# Patient Record
Sex: Male | Born: 1945 | Race: Black or African American | Hispanic: No | Marital: Married | Smoking: Former smoker
Health system: Southern US, Community
[De-identification: ages and names within clinical notes are randomized; demographics above are authoritative.]

## PROBLEM LIST (undated history)

## (undated) DIAGNOSIS — I5032 Chronic diastolic (congestive) heart failure: Secondary | ICD-10-CM

## (undated) DIAGNOSIS — N433 Hydrocele, unspecified: Secondary | ICD-10-CM

## (undated) DIAGNOSIS — I2510 Atherosclerotic heart disease of native coronary artery without angina pectoris: Secondary | ICD-10-CM

## (undated) DIAGNOSIS — G4733 Obstructive sleep apnea (adult) (pediatric): Secondary | ICD-10-CM

## (undated) DIAGNOSIS — E119 Type 2 diabetes mellitus without complications: Secondary | ICD-10-CM

## (undated) DIAGNOSIS — I441 Atrioventricular block, second degree: Secondary | ICD-10-CM

## (undated) DIAGNOSIS — I4510 Unspecified right bundle-branch block: Secondary | ICD-10-CM

## (undated) DIAGNOSIS — N182 Chronic kidney disease, stage 2 (mild): Secondary | ICD-10-CM

## (undated) DIAGNOSIS — I272 Pulmonary hypertension, unspecified: Secondary | ICD-10-CM

## (undated) DIAGNOSIS — I10 Essential (primary) hypertension: Secondary | ICD-10-CM

## (undated) DIAGNOSIS — Z8719 Personal history of other diseases of the digestive system: Secondary | ICD-10-CM

## (undated) DIAGNOSIS — E785 Hyperlipidemia, unspecified: Secondary | ICD-10-CM

## (undated) DIAGNOSIS — K219 Gastro-esophageal reflux disease without esophagitis: Secondary | ICD-10-CM

## (undated) HISTORY — DX: Atrioventricular block, second degree: I44.1

## (undated) HISTORY — DX: Hydrocele, unspecified: N43.3

## (undated) HISTORY — PX: HEMORRHOID SURGERY: SHX153

## (undated) HISTORY — DX: Pulmonary hypertension, unspecified: I27.20

## (undated) HISTORY — PX: CARPAL TUNNEL RELEASE: SHX101

## (undated) HISTORY — DX: Atherosclerotic heart disease of native coronary artery without angina pectoris: I25.10

## (undated) HISTORY — DX: Gastro-esophageal reflux disease without esophagitis: K21.9

## (undated) HISTORY — DX: Personal history of other diseases of the digestive system: Z87.19

---
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
## 1972-02-09 HISTORY — PX: APPENDECTOMY: SHX54

## 2004-02-09 HISTORY — PX: SHOULDER ARTHROSCOPY: SHX128

## 2008-03-01 DIAGNOSIS — N529 Male erectile dysfunction, unspecified: Secondary | ICD-10-CM | POA: Insufficient documentation

## 2008-06-13 ENCOUNTER — Encounter: Admission: RE | Admit: 2008-06-13 | Discharge: 2008-06-13 | Payer: Self-pay | Admitting: Internal Medicine

## 2008-06-24 ENCOUNTER — Encounter: Admission: RE | Admit: 2008-06-24 | Discharge: 2008-06-24 | Payer: Self-pay | Admitting: Internal Medicine

## 2009-03-06 ENCOUNTER — Encounter: Admission: RE | Admit: 2009-03-06 | Discharge: 2009-03-06 | Payer: Self-pay | Admitting: Internal Medicine

## 2009-09-29 ENCOUNTER — Other Ambulatory Visit: Payer: Self-pay | Admitting: Sports Medicine

## 2010-03-19 DIAGNOSIS — I350 Nonrheumatic aortic (valve) stenosis: Secondary | ICD-10-CM | POA: Insufficient documentation

## 2010-03-27 HISTORY — PX: LEFT HEART CATH AND CORONARY ANGIOGRAPHY: CATH118249

## 2010-07-23 DIAGNOSIS — H902 Conductive hearing loss, unspecified: Secondary | ICD-10-CM | POA: Insufficient documentation

## 2010-12-10 DIAGNOSIS — I272 Pulmonary hypertension, unspecified: Secondary | ICD-10-CM

## 2010-12-10 HISTORY — DX: Pulmonary hypertension, unspecified: I27.20

## 2010-12-10 HISTORY — PX: TRANSTHORACIC ECHOCARDIOGRAM: SHX275

## 2010-12-17 DIAGNOSIS — N400 Enlarged prostate without lower urinary tract symptoms: Secondary | ICD-10-CM | POA: Insufficient documentation

## 2011-02-19 DIAGNOSIS — E291 Testicular hypofunction: Secondary | ICD-10-CM | POA: Diagnosis not present

## 2011-03-19 DIAGNOSIS — M109 Gout, unspecified: Secondary | ICD-10-CM | POA: Diagnosis not present

## 2011-03-19 DIAGNOSIS — E119 Type 2 diabetes mellitus without complications: Secondary | ICD-10-CM | POA: Diagnosis not present

## 2011-03-19 DIAGNOSIS — I10 Essential (primary) hypertension: Secondary | ICD-10-CM | POA: Diagnosis not present

## 2011-03-19 DIAGNOSIS — E109 Type 1 diabetes mellitus without complications: Secondary | ICD-10-CM | POA: Diagnosis not present

## 2011-03-19 DIAGNOSIS — Z79899 Other long term (current) drug therapy: Secondary | ICD-10-CM | POA: Diagnosis not present

## 2011-03-19 DIAGNOSIS — E785 Hyperlipidemia, unspecified: Secondary | ICD-10-CM | POA: Diagnosis not present

## 2011-03-22 ENCOUNTER — Other Ambulatory Visit: Payer: Self-pay | Admitting: Nephrology

## 2011-03-22 DIAGNOSIS — N182 Chronic kidney disease, stage 2 (mild): Secondary | ICD-10-CM

## 2011-03-22 DIAGNOSIS — E291 Testicular hypofunction: Secondary | ICD-10-CM | POA: Diagnosis not present

## 2011-03-26 ENCOUNTER — Other Ambulatory Visit: Payer: Self-pay

## 2011-03-30 ENCOUNTER — Ambulatory Visit
Admission: RE | Admit: 2011-03-30 | Discharge: 2011-03-30 | Disposition: A | Discharge status: Home / Self Care | Payer: Medicare Other | Source: Ambulatory Visit | Attending: Nephrology | Admitting: Nephrology

## 2011-03-30 DIAGNOSIS — N281 Cyst of kidney, acquired: Secondary | ICD-10-CM | POA: Diagnosis not present

## 2011-03-30 DIAGNOSIS — N189 Chronic kidney disease, unspecified: Secondary | ICD-10-CM | POA: Diagnosis not present

## 2011-03-30 DIAGNOSIS — N182 Chronic kidney disease, stage 2 (mild): Secondary | ICD-10-CM

## 2011-03-30 MED ORDER — GADOBENATE DIMEGLUMINE 529 MG/ML IV SOLN: 19.0000 mL | Freq: Once | INTRAVENOUS | Status: AC | PRN

## 2011-04-08 DIAGNOSIS — E291 Testicular hypofunction: Secondary | ICD-10-CM | POA: Diagnosis not present

## 2011-04-29 DIAGNOSIS — E291 Testicular hypofunction: Secondary | ICD-10-CM | POA: Diagnosis not present

## 2011-05-13 DIAGNOSIS — K2100 Gastro-esophageal reflux disease with esophagitis, without bleeding: Secondary | ICD-10-CM | POA: Insufficient documentation

## 2011-05-13 DIAGNOSIS — N281 Cyst of kidney, acquired: Secondary | ICD-10-CM | POA: Diagnosis not present

## 2011-05-13 DIAGNOSIS — R634 Abnormal weight loss: Secondary | ICD-10-CM | POA: Diagnosis not present

## 2011-05-13 DIAGNOSIS — R109 Unspecified abdominal pain: Secondary | ICD-10-CM | POA: Diagnosis not present

## 2011-05-19 ENCOUNTER — Other Ambulatory Visit: Payer: Self-pay | Admitting: Gastroenterology

## 2011-05-19 DIAGNOSIS — R109 Unspecified abdominal pain: Secondary | ICD-10-CM

## 2011-05-20 DIAGNOSIS — E291 Testicular hypofunction: Secondary | ICD-10-CM | POA: Diagnosis not present

## 2011-05-24 ENCOUNTER — Ambulatory Visit
Admission: RE | Admit: 2011-05-24 | Discharge: 2011-05-24 | Disposition: A | Discharge status: Home / Self Care | Payer: Medicare Other | Source: Ambulatory Visit | Attending: Gastroenterology | Admitting: Gastroenterology

## 2011-05-24 DIAGNOSIS — R109 Unspecified abdominal pain: Secondary | ICD-10-CM

## 2011-05-24 DIAGNOSIS — N281 Cyst of kidney, acquired: Secondary | ICD-10-CM | POA: Diagnosis not present

## 2011-05-24 DIAGNOSIS — N433 Hydrocele, unspecified: Secondary | ICD-10-CM | POA: Diagnosis not present

## 2011-05-24 MED ORDER — IOHEXOL 300 MG/ML  SOLN
75.0000 mL | Freq: Once | INTRAMUSCULAR | Status: AC | PRN
  Administered 2011-05-24: 75 mL via INTRAVENOUS

## 2011-06-09 DIAGNOSIS — Z1211 Encounter for screening for malignant neoplasm of colon: Secondary | ICD-10-CM | POA: Diagnosis not present

## 2011-06-09 DIAGNOSIS — R109 Unspecified abdominal pain: Secondary | ICD-10-CM | POA: Diagnosis not present

## 2011-06-10 DIAGNOSIS — E291 Testicular hypofunction: Secondary | ICD-10-CM | POA: Diagnosis not present

## 2011-06-16 DIAGNOSIS — K319 Disease of stomach and duodenum, unspecified: Secondary | ICD-10-CM | POA: Diagnosis not present

## 2011-06-16 DIAGNOSIS — R1013 Epigastric pain: Secondary | ICD-10-CM | POA: Diagnosis not present

## 2011-06-16 DIAGNOSIS — K2970 Gastritis, unspecified, without bleeding: Secondary | ICD-10-CM | POA: Diagnosis not present

## 2011-06-24 DIAGNOSIS — E291 Testicular hypofunction: Secondary | ICD-10-CM | POA: Diagnosis not present

## 2011-06-24 DIAGNOSIS — E119 Type 2 diabetes mellitus without complications: Secondary | ICD-10-CM | POA: Diagnosis not present

## 2011-06-24 DIAGNOSIS — E785 Hyperlipidemia, unspecified: Secondary | ICD-10-CM | POA: Diagnosis not present

## 2011-06-24 DIAGNOSIS — Z125 Encounter for screening for malignant neoplasm of prostate: Secondary | ICD-10-CM | POA: Diagnosis not present

## 2011-06-24 DIAGNOSIS — I10 Essential (primary) hypertension: Secondary | ICD-10-CM | POA: Diagnosis not present

## 2011-07-01 DIAGNOSIS — IMO0002 Reserved for concepts with insufficient information to code with codable children: Secondary | ICD-10-CM | POA: Diagnosis not present

## 2011-07-01 DIAGNOSIS — M549 Dorsalgia, unspecified: Secondary | ICD-10-CM | POA: Diagnosis not present

## 2011-07-02 DIAGNOSIS — R188 Other ascites: Secondary | ICD-10-CM | POA: Diagnosis not present

## 2011-07-02 DIAGNOSIS — M5126 Other intervertebral disc displacement, lumbar region: Secondary | ICD-10-CM | POA: Diagnosis not present

## 2011-07-02 DIAGNOSIS — R109 Unspecified abdominal pain: Secondary | ICD-10-CM | POA: Diagnosis not present

## 2011-07-07 DIAGNOSIS — E291 Testicular hypofunction: Secondary | ICD-10-CM | POA: Diagnosis not present

## 2011-07-12 DIAGNOSIS — E119 Type 2 diabetes mellitus without complications: Secondary | ICD-10-CM | POA: Diagnosis not present

## 2011-07-12 DIAGNOSIS — M47817 Spondylosis without myelopathy or radiculopathy, lumbosacral region: Secondary | ICD-10-CM | POA: Diagnosis not present

## 2011-07-12 DIAGNOSIS — M5137 Other intervertebral disc degeneration, lumbosacral region: Secondary | ICD-10-CM | POA: Diagnosis not present

## 2011-07-12 DIAGNOSIS — H25019 Cortical age-related cataract, unspecified eye: Secondary | ICD-10-CM | POA: Diagnosis not present

## 2011-07-12 DIAGNOSIS — H2510 Age-related nuclear cataract, unspecified eye: Secondary | ICD-10-CM | POA: Diagnosis not present

## 2011-07-12 DIAGNOSIS — H52209 Unspecified astigmatism, unspecified eye: Secondary | ICD-10-CM | POA: Diagnosis not present

## 2011-07-22 DIAGNOSIS — E291 Testicular hypofunction: Secondary | ICD-10-CM | POA: Diagnosis not present

## 2011-08-05 DIAGNOSIS — E291 Testicular hypofunction: Secondary | ICD-10-CM | POA: Diagnosis not present

## 2011-08-19 DIAGNOSIS — E291 Testicular hypofunction: Secondary | ICD-10-CM | POA: Diagnosis not present

## 2011-09-09 DIAGNOSIS — E291 Testicular hypofunction: Secondary | ICD-10-CM | POA: Diagnosis not present

## 2011-09-21 DIAGNOSIS — L049 Acute lymphadenitis, unspecified: Secondary | ICD-10-CM | POA: Diagnosis not present

## 2011-09-21 DIAGNOSIS — E291 Testicular hypofunction: Secondary | ICD-10-CM | POA: Diagnosis not present

## 2011-09-21 DIAGNOSIS — E119 Type 2 diabetes mellitus without complications: Secondary | ICD-10-CM | POA: Diagnosis not present

## 2011-09-23 DIAGNOSIS — H7090 Unspecified mastoiditis, unspecified ear: Secondary | ICD-10-CM | POA: Diagnosis not present

## 2011-10-06 DIAGNOSIS — E291 Testicular hypofunction: Secondary | ICD-10-CM | POA: Diagnosis not present

## 2011-10-20 DIAGNOSIS — N400 Enlarged prostate without lower urinary tract symptoms: Secondary | ICD-10-CM | POA: Diagnosis not present

## 2011-10-20 DIAGNOSIS — E291 Testicular hypofunction: Secondary | ICD-10-CM | POA: Diagnosis not present

## 2011-10-20 DIAGNOSIS — Z23 Encounter for immunization: Secondary | ICD-10-CM | POA: Diagnosis not present

## 2011-10-20 DIAGNOSIS — E785 Hyperlipidemia, unspecified: Secondary | ICD-10-CM | POA: Diagnosis not present

## 2011-10-20 DIAGNOSIS — R109 Unspecified abdominal pain: Secondary | ICD-10-CM | POA: Diagnosis not present

## 2011-11-04 DIAGNOSIS — E291 Testicular hypofunction: Secondary | ICD-10-CM | POA: Diagnosis not present

## 2011-11-19 DIAGNOSIS — E291 Testicular hypofunction: Secondary | ICD-10-CM | POA: Diagnosis not present

## 2011-11-29 DIAGNOSIS — L290 Pruritus ani: Secondary | ICD-10-CM | POA: Diagnosis not present

## 2011-12-03 DIAGNOSIS — E291 Testicular hypofunction: Secondary | ICD-10-CM | POA: Diagnosis not present

## 2011-12-17 DIAGNOSIS — E291 Testicular hypofunction: Secondary | ICD-10-CM | POA: Diagnosis not present

## 2011-12-21 DIAGNOSIS — E785 Hyperlipidemia, unspecified: Secondary | ICD-10-CM | POA: Diagnosis not present

## 2011-12-21 DIAGNOSIS — Z79899 Other long term (current) drug therapy: Secondary | ICD-10-CM | POA: Diagnosis not present

## 2011-12-21 DIAGNOSIS — E782 Mixed hyperlipidemia: Secondary | ICD-10-CM | POA: Diagnosis not present

## 2011-12-21 DIAGNOSIS — E119 Type 2 diabetes mellitus without complications: Secondary | ICD-10-CM | POA: Diagnosis not present

## 2011-12-21 DIAGNOSIS — E291 Testicular hypofunction: Secondary | ICD-10-CM | POA: Diagnosis not present

## 2012-01-03 DIAGNOSIS — E291 Testicular hypofunction: Secondary | ICD-10-CM | POA: Diagnosis not present

## 2012-01-17 DIAGNOSIS — E291 Testicular hypofunction: Secondary | ICD-10-CM | POA: Diagnosis not present

## 2012-02-11 DIAGNOSIS — E291 Testicular hypofunction: Secondary | ICD-10-CM | POA: Diagnosis not present

## 2012-02-17 DIAGNOSIS — E782 Mixed hyperlipidemia: Secondary | ICD-10-CM | POA: Diagnosis not present

## 2012-02-17 DIAGNOSIS — R42 Dizziness and giddiness: Secondary | ICD-10-CM | POA: Diagnosis not present

## 2012-02-17 DIAGNOSIS — F17200 Nicotine dependence, unspecified, uncomplicated: Secondary | ICD-10-CM | POA: Diagnosis not present

## 2012-02-17 DIAGNOSIS — I10 Essential (primary) hypertension: Secondary | ICD-10-CM | POA: Diagnosis not present

## 2012-03-01 DIAGNOSIS — E291 Testicular hypofunction: Secondary | ICD-10-CM | POA: Diagnosis not present

## 2012-03-17 ENCOUNTER — Encounter (HOSPITAL_COMMUNITY)
Admission: EM | Disposition: A | Discharge status: Home / Self Care | Payer: Self-pay | Source: Home / Self Care | Attending: Cardiology

## 2012-03-17 ENCOUNTER — Other Ambulatory Visit: Payer: Self-pay

## 2012-03-17 ENCOUNTER — Observation Stay (HOSPITAL_COMMUNITY)
Admission: EM | Admit: 2012-03-17 | Discharge: 2012-03-18 | Disposition: A | Discharge status: Home / Self Care | Payer: Medicare Other | Attending: Cardiology | Admitting: Cardiology

## 2012-03-17 ENCOUNTER — Encounter (HOSPITAL_COMMUNITY): Payer: Self-pay

## 2012-03-17 ENCOUNTER — Emergency Department (HOSPITAL_COMMUNITY): Payer: Medicare Other

## 2012-03-17 DIAGNOSIS — E782 Mixed hyperlipidemia: Secondary | ICD-10-CM | POA: Diagnosis not present

## 2012-03-17 DIAGNOSIS — I4510 Unspecified right bundle-branch block: Secondary | ICD-10-CM | POA: Diagnosis present

## 2012-03-17 DIAGNOSIS — I2510 Atherosclerotic heart disease of native coronary artery without angina pectoris: Secondary | ICD-10-CM | POA: Diagnosis not present

## 2012-03-17 DIAGNOSIS — R079 Chest pain, unspecified: Principal | ICD-10-CM | POA: Diagnosis present

## 2012-03-17 DIAGNOSIS — R9431 Abnormal electrocardiogram [ECG] [EKG]: Secondary | ICD-10-CM | POA: Diagnosis not present

## 2012-03-17 DIAGNOSIS — I129 Hypertensive chronic kidney disease with stage 1 through stage 4 chronic kidney disease, or unspecified chronic kidney disease: Secondary | ICD-10-CM | POA: Insufficient documentation

## 2012-03-17 DIAGNOSIS — G4733 Obstructive sleep apnea (adult) (pediatric): Secondary | ICD-10-CM | POA: Diagnosis present

## 2012-03-17 DIAGNOSIS — E119 Type 2 diabetes mellitus without complications: Secondary | ICD-10-CM | POA: Insufficient documentation

## 2012-03-17 DIAGNOSIS — N182 Chronic kidney disease, stage 2 (mild): Secondary | ICD-10-CM | POA: Diagnosis not present

## 2012-03-17 DIAGNOSIS — M25519 Pain in unspecified shoulder: Secondary | ICD-10-CM | POA: Diagnosis present

## 2012-03-17 DIAGNOSIS — I200 Unstable angina: Secondary | ICD-10-CM | POA: Diagnosis not present

## 2012-03-17 DIAGNOSIS — I441 Atrioventricular block, second degree: Secondary | ICD-10-CM | POA: Clinically undetermined

## 2012-03-17 DIAGNOSIS — I10 Essential (primary) hypertension: Secondary | ICD-10-CM | POA: Diagnosis not present

## 2012-03-17 DIAGNOSIS — F17200 Nicotine dependence, unspecified, uncomplicated: Secondary | ICD-10-CM | POA: Diagnosis not present

## 2012-03-17 HISTORY — DX: Chronic kidney disease, stage 2 (mild): N18.2

## 2012-03-17 HISTORY — DX: Obstructive sleep apnea (adult) (pediatric): G47.33

## 2012-03-17 HISTORY — DX: Essential (primary) hypertension: I10

## 2012-03-17 HISTORY — PX: LEFT HEART CATHETERIZATION WITH CORONARY ANGIOGRAM: SHX5451

## 2012-03-17 HISTORY — DX: Hyperlipidemia, unspecified: E78.5

## 2012-03-17 HISTORY — DX: Unspecified right bundle-branch block: I45.10

## 2012-03-17 HISTORY — DX: Type 2 diabetes mellitus without complications: E11.9

## 2012-03-17 LAB — GLUCOSE, CAPILLARY
Glucose-Capillary: 102 mg/dL — ABNORMAL HIGH (ref 70–99)
Glucose-Capillary: 128 mg/dL — ABNORMAL HIGH (ref 70–99)

## 2012-03-17 LAB — POCT I-STAT TROPONIN I: Troponin i, poc: 0.01 ng/mL (ref 0.00–0.08)

## 2012-03-17 LAB — CBC WITH DIFFERENTIAL/PLATELET
Basophils Absolute: 0 10*3/uL (ref 0.0–0.1)
Basophils Relative: 0 % (ref 0–1)
Eosinophils Absolute: 0.1 10*3/uL (ref 0.0–0.7)
Eosinophils Relative: 1 % (ref 0–5)
Hemoglobin: 16.9 g/dL (ref 13.0–17.0)
Lymphocytes Relative: 22 % (ref 12–46)
Lymphs Abs: 2 10*3/uL (ref 0.7–4.0)
MCHC: 35.5 g/dL (ref 30.0–36.0)
Monocytes Absolute: 0.4 10*3/uL (ref 0.1–1.0)
Neutro Abs: 6.3 10*3/uL (ref 1.7–7.7)
RBC: 5.75 MIL/uL (ref 4.22–5.81)

## 2012-03-17 LAB — TROPONIN I
Troponin I: <0.3 ng/mL (ref <0.30)
Troponin I: <0.3 ng/mL (ref <0.30)

## 2012-03-17 LAB — BASIC METABOLIC PANEL
CO2: 29 mEq/L (ref 19–32)
Calcium: 10.1 mg/dL (ref 8.4–10.5)
Chloride: 103 mEq/L (ref 96–112)
Creatinine, Ser: 1.3 mg/dL (ref 0.50–1.35)
GFR calc Af Amer: 64 mL/min — ABNORMAL LOW (ref >90)
GFR calc non Af Amer: 55 mL/min — ABNORMAL LOW (ref >90)

## 2012-03-17 LAB — APTT: aPTT: 33 seconds (ref 24–37)

## 2012-03-17 LAB — PROTIME-INR
INR: 0.91 (ref 0.00–1.49)
Prothrombin Time: 12.2 seconds (ref 11.6–15.2)

## 2012-03-17 SURGERY — LEFT HEART CATHETERIZATION WITH CORONARY ANGIOGRAM
Anesthesia: LOCAL

## 2012-03-17 MED ORDER — FENTANYL CITRATE 0.05 MG/ML IJ SOLN
INTRAMUSCULAR | Status: AC
  Filled 2012-03-17: qty 2

## 2012-03-17 MED ORDER — ONDANSETRON HCL 4 MG/2ML IJ SOLN: 4.0000 mg | Freq: Four times a day (QID) | INTRAMUSCULAR | Status: DC | PRN

## 2012-03-17 MED ORDER — SODIUM CHLORIDE 0.9 % IV SOLN
1.0000 mL/kg/h | INTRAVENOUS | Status: DC
  Administered 2012-03-17: 1 mL/kg/h via INTRAVENOUS

## 2012-03-17 MED ORDER — SODIUM CHLORIDE 0.9 % IV SOLN: INTRAVENOUS | Status: DC

## 2012-03-17 MED ORDER — NITROGLYCERIN 2 % TD OINT
1.0000 [in_us] | TOPICAL_OINTMENT | Freq: Four times a day (QID) | TRANSDERMAL | Status: DC
  Administered 2012-03-17 – 2012-03-18 (×3): 1 [in_us] via TOPICAL
  Filled 2012-03-17: qty 30

## 2012-03-17 MED ORDER — SODIUM CHLORIDE 0.9 % IV SOLN
INTRAVENOUS | Status: DC
  Administered 2012-03-17: 16:00:00 via INTRAVENOUS

## 2012-03-17 MED ORDER — ASPIRIN 325 MG PO TABS
325.0000 mg | ORAL_TABLET | Freq: Every day | ORAL | Status: DC
  Administered 2012-03-18: 10:00:00 325 mg via ORAL
  Filled 2012-03-17 (×2): qty 1

## 2012-03-17 MED ORDER — NITROGLYCERIN 1 MG/10 ML FOR IR/CATH LAB
INTRA_ARTERIAL | Status: AC
  Filled 2012-03-17: qty 10

## 2012-03-17 MED ORDER — HEPARIN SODIUM (PORCINE) 1000 UNIT/ML IJ SOLN
INTRAMUSCULAR | Status: AC
  Filled 2012-03-17: qty 1

## 2012-03-17 MED ORDER — ASPIRIN 81 MG PO CHEW: 324.0000 mg | CHEWABLE_TABLET | ORAL | Status: DC

## 2012-03-17 MED ORDER — SODIUM CHLORIDE 0.9 % IJ SOLN: 3.0000 mL | Freq: Two times a day (BID) | INTRAMUSCULAR | Status: DC

## 2012-03-17 MED ORDER — ADENOSINE 12 MG/4ML IV SOLN
16.0000 mL | Freq: Once | INTRAVENOUS | Status: DC
  Filled 2012-03-17: qty 16

## 2012-03-17 MED ORDER — VERAPAMIL HCL 2.5 MG/ML IV SOLN
INTRAVENOUS | Status: AC
  Filled 2012-03-17: qty 2

## 2012-03-17 MED ORDER — ACETAMINOPHEN 325 MG PO TABS
650.0000 mg | ORAL_TABLET | ORAL | Status: DC | PRN
  Administered 2012-03-17 – 2012-03-18 (×2): 650 mg via ORAL
  Filled 2012-03-17 (×2): qty 2

## 2012-03-17 MED ORDER — SODIUM CHLORIDE 0.9 % IJ SOLN: 3.0000 mL | INTRAMUSCULAR | Status: DC | PRN

## 2012-03-17 MED ORDER — ZOLPIDEM TARTRATE 5 MG PO TABS: 5.0000 mg | ORAL_TABLET | Freq: Every evening | ORAL | Status: DC | PRN

## 2012-03-17 MED ORDER — CARVEDILOL 3.125 MG PO TABS
3.1250 mg | ORAL_TABLET | Freq: Two times a day (BID) | ORAL | Status: DC
  Filled 2012-03-17 (×2): qty 1

## 2012-03-17 MED ORDER — OMEGA-3-ACID ETHYL ESTERS 1 G PO CAPS
1.0000 g | ORAL_CAPSULE | Freq: Every day | ORAL | Status: DC
  Administered 2012-03-17 – 2012-03-18 (×2): 1 g via ORAL
  Filled 2012-03-17 (×3): qty 1

## 2012-03-17 MED ORDER — MIDAZOLAM HCL 2 MG/2ML IJ SOLN
INTRAMUSCULAR | Status: AC
  Filled 2012-03-17: qty 2

## 2012-03-17 MED ORDER — ATORVASTATIN CALCIUM 80 MG PO TABS
80.0000 mg | ORAL_TABLET | Freq: Every day | ORAL | Status: DC
  Filled 2012-03-17 (×2): qty 1

## 2012-03-17 MED ORDER — HEPARIN (PORCINE) IN NACL 2-0.9 UNIT/ML-% IJ SOLN
INTRAMUSCULAR | Status: AC
  Filled 2012-03-17: qty 1000

## 2012-03-17 MED ORDER — IRBESARTAN 300 MG PO TABS
300.0000 mg | ORAL_TABLET | Freq: Every day | ORAL | Status: DC
  Administered 2012-03-17 – 2012-03-18 (×2): 300 mg via ORAL
  Filled 2012-03-17 (×3): qty 1

## 2012-03-17 MED ORDER — HEPARIN (PORCINE) IN NACL 100-0.45 UNIT/ML-% IJ SOLN
1000.0000 [IU]/h | INTRAMUSCULAR | Status: DC
  Administered 2012-03-17: 1000 [IU]/h via INTRAVENOUS
  Filled 2012-03-17: qty 250

## 2012-03-17 MED ORDER — GLIPIZIDE 10 MG PO TABS
10.0000 mg | ORAL_TABLET | Freq: Two times a day (BID) | ORAL | Status: DC
  Administered 2012-03-18: 10 mg via ORAL
  Filled 2012-03-17 (×5): qty 1

## 2012-03-17 MED ORDER — NITROGLYCERIN 2 % TD OINT
1.0000 [in_us] | TOPICAL_OINTMENT | Freq: Once | TRANSDERMAL | Status: AC
  Administered 2012-03-17: 1 [in_us] via TOPICAL
  Filled 2012-03-17: qty 1

## 2012-03-17 MED ORDER — NITROGLYCERIN 0.4 MG SL SUBL: 0.4000 mg | SUBLINGUAL_TABLET | SUBLINGUAL | Status: DC | PRN

## 2012-03-17 MED ORDER — MORPHINE SULFATE 2 MG/ML IJ SOLN: 1.0000 mg | INTRAMUSCULAR | Status: DC | PRN

## 2012-03-17 MED ORDER — SODIUM CHLORIDE 0.9 % IV SOLN: 250.0000 mL | INTRAVENOUS | Status: DC

## 2012-03-17 MED ORDER — NITROGLYCERIN 0.4 MG SL SUBL
0.4000 mg | SUBLINGUAL_TABLET | SUBLINGUAL | Status: DC | PRN
  Administered 2012-03-17: 0.4 mg via SUBLINGUAL
  Filled 2012-03-17: qty 25

## 2012-03-17 MED ORDER — INSULIN ASPART 100 UNIT/ML ~~LOC~~ SOLN: 0.0000 [IU] | Freq: Three times a day (TID) | SUBCUTANEOUS | Status: DC

## 2012-03-17 MED ORDER — HEPARIN BOLUS VIA INFUSION
4000.0000 [IU] | Freq: Once | INTRAVENOUS | Status: AC
  Administered 2012-03-17: 4000 [IU] via INTRAVENOUS

## 2012-03-17 MED ORDER — OMEGA-3 FATTY ACIDS 1000 MG PO CAPS: 1.0000 g | ORAL_CAPSULE | Freq: Every day | ORAL | Status: DC

## 2012-03-17 MED ORDER — DIAZEPAM 5 MG PO TABS: 5.0000 mg | ORAL_TABLET | ORAL | Status: DC

## 2012-03-17 MED ORDER — LIDOCAINE HCL (PF) 1 % IJ SOLN
INTRAMUSCULAR | Status: AC
  Filled 2012-03-17: qty 30

## 2012-03-17 MED ORDER — ALPRAZOLAM 0.25 MG PO TABS: 0.2500 mg | ORAL_TABLET | Freq: Two times a day (BID) | ORAL | Status: DC | PRN

## 2012-03-17 MED ORDER — ACETAMINOPHEN 325 MG PO TABS: 650.0000 mg | ORAL_TABLET | ORAL | Status: DC | PRN

## 2012-03-17 NOTE — ED Notes (Signed)
Pt returned from radiology.

## 2012-03-17 NOTE — ED Notes (Signed)
Cardiology at bedside.

## 2012-03-17 NOTE — Progress Notes (Signed)
Pt. Had episode of bradycardia HR -38-40's, pt. asymptomatic except complained of HA earlier. Tylenol was given. BP stable denies any dizziness or short of breath. D/C nitro patch lt. Arm. Dr. Herbie Baltimore made aware, will cont. to monitor.

## 2012-03-17 NOTE — ED Notes (Signed)
MD at bedside. 

## 2012-03-17 NOTE — Progress Notes (Signed)
ANTICOAGULATION CONSULT NOTE - Initial Consult  Pharmacy Consult for heparin Indication: chest pain/ACS  No Known Allergies  Patient Measurements: Height: 5\' 8"  (172.7 cm) Weight: 188 lb (85.276 kg) IBW/kg (Calculated) : 68.4  Heparin Dosing Weight: 85kg   Vital Signs: Temp: 98.1 F (36.7 C) (02/07 1146) Temp src: Oral (02/07 1146) BP: 123/49 mmHg (02/07 1430) Pulse Rate: 51  (02/07 1430)  Labs:  Basename 03/17/12 1152  HGB 16.9  HCT 47.6  PLT 241  APTT --  LABPROT --  INR --  HEPARINUNFRC --  CREATININE 1.30  CKTOTAL --  CKMB --  TROPONINI --    Estimated Creatinine Clearance: 57.8 ml/min (by C-G formula based on Cr of 1.3).   Medical History: Past Medical History  Diagnosis Date  . Diabetes mellitus without complication   . Hypertension   . Hyperlipidemia   . Coronary artery disease   . Chest pain on exertion 03/17/2012  . HTN (hypertension) 03/17/2012  . DM (diabetes mellitus) 03/17/2012  . 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 CKD (chronic kidney disease) stage 2, GFR 60-89 ml/min 03/17/2012   Assessment: 67 year old male with multiple cardiovascular risk factors presents to [facility] with chest pain. First TpI was 0.01. CBC is within normal limits, patient is not on any blood thinners except aspirin prior to admission. Orders to initiate IV heparin with possible plans for cath later today.  Goal of Therapy:  Heparin level 0.3-0.7 units/ml Monitor platelets by anticoagulation protocol: Yes   Plan:  Give 4000 units bolus x 1 Start heparin infusion at 1000 units/hr Check anti-Xa level in 6 hours and daily while on heparin Continue to monitor H&H and platelets [facility] 03/17/2012,3:11 PM

## 2012-03-17 NOTE — ED Notes (Signed)
Pt transported to radiology.

## 2012-03-17 NOTE — ED Provider Notes (Addendum)
History     CSN: [id number]  Arrival date & time 03/17/12  1141   First MD Initiated Contact with Patient 03/17/12 1214      No chief complaint on file.   (Consider location/radiation/quality/duration/timing/severity/associated sxs/prior treatment) HPI Complains of left shoulder pain onset 3 AM today. Patient reports that today while on his for mild walk he suffered "heartburn" and anterior chest which resolved with rest. He denies associated nausea sweatiness or shortness of breath. Presently complains of mild left shoulder pain, described as 3 on a scale of 1-10. Pain is not made better or worse by anything. He was evaluated by his primary care physician this morning and sent here for further evaluation. Treated with 4 baby aspirin prior to cominghere. Denies chest pain at present. Only complains of mild left shoulder pain which is nonradiating Past Medical History  Diagnosis Date  . Diabetes mellitus without complication   . Hypertension   . Hyperlipidemia   . Coronary artery disease     Past Surgical History  Procedure Date  . Shoulder arthroscopy   . Appendectomy   . Carpal tunnel release   . Hemorrhoid surgery     History reviewed. No pertinent family history.  History  Substance Use Topics  . Smoking status: Current Every Day Smoker -- 2.0 packs/day  . Smokeless tobacco: Not on file  . Alcohol Use: Yes      Review of Systems  Constitutional: Negative.   HENT: Negative.   Respiratory: Negative.   Cardiovascular: Positive for chest pain.  Gastrointestinal: Negative.   Musculoskeletal: Positive for arthralgias.       Left shoulder pain  Skin: Negative.   Neurological: Negative.   Hematological: Negative.   Psychiatric/Behavioral: Negative.   All other systems reviewed and are negative.    Allergies  Review of patient's allergies indicates not on file.  Home Medications  No current outpatient prescriptions on file.  BP 136/61  Pulse 66  Temp 98.1 F  (36.7 C) (Oral)  Resp 19  SpO2 95%  Physical Exam  Nursing note and vitals reviewed. Constitutional: He appears well-developed and well-nourished.  HENT:  Head: Normocephalic and atraumatic.  Eyes: Conjunctivae normal are normal. Pupils are equal, round, and reactive to light.  Neck: Neck supple. No tracheal deviation present. No thyromegaly present.  Cardiovascular: Normal rate and regular rhythm.   No murmur heard. Pulmonary/Chest: Effort normal and breath sounds normal.  Abdominal: Soft. Bowel sounds are normal. He exhibits no distension. There is no tenderness.  Musculoskeletal: Normal range of motion. He exhibits no edema and no tenderness.  Neurological: He is alert. Coordination normal.  Skin: Skin is warm and dry. No rash noted.  Psychiatric: He has a normal mood and affect.    ED Course  Procedures (including critical care time)   Labs Reviewed  CBC WITH DIFFERENTIAL  POCT I-STAT TROPONIN I  BASIC METABOLIC PANEL   No results found.   Date: 03/17/2012  Rate: 55  Rhythm: sinus bradycardia  QRS Axis: normal  Intervals: normal  ST/T Wave abnormalities: nonspecific T wave changes  Conduction Disutrbances:right bundle branch block  Narrative Interpretation:   Old EKG Reviewed: Unchanged from EKG performed act 10:43 AM today. Right bundle branch block is new from EKG performed on 07/23/2010  No diagnosis found. Results for orders placed during the hospital encounter of 03/17/12  CBC WITH DIFFERENTIAL      Component Value Range   WBC 8.8  4.0 - 10.5 K/uL   RBC 5.75  4.22 - 5.81 MIL/uL   Hemoglobin 16.9  13.0 - 17.0 g/dL   HCT 08.6  57.8 - 46.9 %   MCV 82.8  78.0 - 100.0 fL   MCH 29.4  26.0 - 34.0 pg   MCHC 35.5  30.0 - 36.0 g/dL   RDW 62.9  52.8 - 41.3 %   Platelets 241  150 - 400 K/uL   Neutrophils Relative 71  43 - 77 %   Neutro Abs 6.3  1.7 - 7.7 K/uL   Lymphocytes Relative 22  12 - 46 %   Lymphs Abs 2.0  0.7 - 4.0 K/uL   Monocytes Relative 5  3 - 12 %    Monocytes Absolute 0.4  0.1 - 1.0 K/uL   Eosinophils Relative 1  0 - 5 %   Eosinophils Absolute 0.1  0.0 - 0.7 K/uL   Basophils Relative 0  0 - 1 %   Basophils Absolute 0.0  0.0 - 0.1 K/uL  BASIC METABOLIC PANEL      Component Value Range   Sodium 142  135 - 145 mEq/L   Potassium 3.7  3.5 - 5.1 mEq/L   Chloride 103  96 - 112 mEq/L   CO2 29  19 - 32 mEq/L   Glucose, Bld 93  70 - 99 mg/dL   BUN 22  6 - 23 mg/dL   Creatinine, Ser 2.44  0.50 - 1.35 mg/dL   Calcium 01.0  8.4 - 27.2 mg/dL   GFR calc non Af Amer 55 (*) >90 mL/min   GFR calc Af Amer 64 (*) >90 mL/min  POCT I-STAT TROPONIN I      Component Value Range   Troponin i, poc 0.01  0.00 - 0.08 ng/mL   Comment 3            Dg Chest 2 View  03/17/2012  *RADIOLOGY REPORT*  Clinical Data: Chest pain.  CHEST - 2 VIEW  Comparison: Jun 24, 2008.  Findings: Cardiomediastinal silhouette appears normal.  No acute pulmonary disease is noted.  Bony thorax is intact.  IMPRESSION: No acute cardiopulmonary abnormality seen.   Original Report Authenticated By: Lupita Raider.,  M.D.    Chest xray reviewed by me [facility]  Concern for unstable angina given symptoms and risk factors and past history EKG is nonacute Patient to be admitted  Diagnosis #1 unstable angina #2 tobacco abuse      Doug Sou, MD 03/17/12 1322  Doug Sou, MD 03/17/12 1530

## 2012-03-17 NOTE — ED Notes (Signed)
Pt instructed to remove clothing and change into hospital gown.

## 2012-03-17 NOTE — H&P (Signed)
ATTENDING ATTESTATION:  I have seen and examined the patient along with Nada Boozer, PA,.  I have reviewed the chart, notes and new data.  I agree with her findings, examination & recommendations as noted above.  Brief Description: 67 y/o pt of mine followed for cardiac RFs of DM-2, HTN, HLD & OSA who is a relatively active man walking ~71miles a day.  He was in his USOH until he awoke @ ~3AM with sudden onset L shoulder Ache that he initially attributed to prior rotator cuff Sgx.  Pain waxed & waned (completely abating) throughout the AM -- was able todo his 4 mile walk, but was then troubled by "heartburn Sx" that were "a bit different" than his usual Sx while on his walk --  he had not eaten since PCP was planning on checking labs.  When he told his PCP about these Sx, ECG checked -- RBBB with non-specific ST-changes, but sent to [facility] ED for evaluation.  Pain improved from 8/10 to 2-3/10 after NTG & NTG past.   Key new complaints: new onset SSx concerning for possible unstable angina in a pt with mutiple RFs.  Key examination changes: essentially normal - no focal shoulder abnormalities on ortho exam of shoulder, no point tenderness or decreased ROM.  Key new findings / data: Troponin negative, ECG with RBBB.  PLAN:  With concerning SSx for Unstable Angina - I am concerned that he awakened from sleep & worsening heartburn while walking (no food on stomach & has never had these Sx with exertion).  We discussed potential options of R/o MI - ST as OP (not good option), r/o MI & in patient ST (if abnormal will mean prolonged stay & uncertainty) -- he & his wife agree with me that the most definitive study to assess his Sx is diagnostic cardiac catheterization.    The procedure with Risks/Benefits/Alternatives and Indications was reviewed with the patient & his wife.  All questions were answered.    Risks / Complications include, but not limited to: Death, MI, CVA/TIA, VF/VT (with defibrillation),  Bradycardia (need for temporary pacer placement), contrast induced nephropathy, bleeding / bruising / hematoma / pseudoaneurysm, vascular or coronary injury (with possible emergent CT or Vascular Surgery), adverse medication reactions, infection.    The patient and his wife both voicced understanding and agree to proceed.   I have signed the consent form and placed it on the chart for patient signature and RN witness.      For now, will continue current meds - adjust per results of cath.  Marykay Lex, M.D., M.S. [facility] [address] [place], [place]  [address]  [phone]  03/17/2012 3:02 PM

## 2012-03-17 NOTE — CV Procedure (Signed)
[facility] CARDIAC CATHETERIZATION REPORT  NAME:  [facility]   MRN: [id number] DOB:  06/13/1945   ADMIT DATE: 03/17/2012 Procedure Date: 03/17/2012  INTERVENTIONAL CARDIOLOGIST: Marykay Lex, M.D., MS PRIMARY CARE PROVIDER: Herb Grays, MD PRIMARY CARDIOLOGIST: Marykay Lex, M.D., MS  PATIENT:  [facility] is a 67 y.o. male pt of mine followed for cardiac RFs of DM-2, HTN, HLD & OSA who is a relatively active man walking ~59miles a day. He was in his USOH until he awoke @ ~3AM with sudden onset L shoulder Ache that he initially attributed to prior rotator cuff Sgx. Pain waxed & waned (completely abating) throughout the AM -- was able todo his 4 mile walk, but was then troubled by "heartburn Sx" that were "a bit different" than his usual Sx while on his walk -- he had not eaten since PCP was planning on checking labs. When he told his PCP about these Sx, ECG checked -- RBBB with non-specific ST-changes, but sent to [facility] ED for evaluation. Pain improved from 8/10 to 2-3/10 after NTG & NTG paste.  With concerning SSx for Unstable Angina - I am concerned that he awakened from sleep & worsening heartburn while walking (no food on stomach & has never had these Sx with exertion). We discussed potential options of R/o MI - ST as OP (not good option), r/o MI & in patient ST (if abnormal will mean prolonged stay & uncertainty) -- he & his wife agree with me that the most definitive study to assess his Sx is diagnostic cardiac catheterization.   PRE-OPERATIVE DIAGNOSIS:    Atypical Angina, Unstable  Multiple Cardiac Risk Factor  PROCEDURES PERFORMED:    Left Heart Catheterization with native Coronary Angiography  Fractional Flow Reserve Measurement of mid RCA 60-70% stenosis = 0.84  PROCEDURE:Consent:  Risks of procedure as well as the alternatives and risks of each were explained to the (patient/caregiver).  Consent for procedure obtained. Consent for signed by MD and  patient with RN witness -- placed on chart.   PROCEDURE: The patient was brought to the 2nd Floor [facility] in the fasting state and prepped and draped in the usual sterile fashion for Right radial access after a modified Allen's test demonstrated excellent Ulnar Artery collateral flow.. Sterile technique was used including antiseptics, cap, gloves, gown, hand hygiene, mask and sheet.  Skin prep: Chlorhexidine.  Time Out: Verified patient identification, verified procedure, site/side was marked, verified correct patient position, special equipment/implants available, medications/allergies/relevent history reviewed, required imaging and test results available.  Performed  Access: Right Radial Artery; 5 Fr Sheath, Seldinger technique using Angiocath Micropuncture Kit.  IV Heparin 4000 Units & 10ml Radial Cocktail administered.  Diagnostic:  5Fr TIG 4.0 advanced over Versicore wire; exchanges over long safety J-wire  Left & Right Coronary Artery Angiography: TIG 4.0  LV Hemodynamics (LV Gram): Angled Pigtail Catheter  Fractional Flow Reserve Measurement of mid RCA lesion:  Additional 2000 Units IV Heparin administered  Guide: 5 Fr  JR4 Guidewire: [place] Primewire  -- Adenosine infusion x 2 min; Final FFR 0.84 combined proximal & mid, 0.93 proximal only  TR Band:  1720 Hours, 14 mL air  ANESTHESIA:   Local Lidocaine 2 ml SEDATION:  2 mg IV Versed, 50 mcg IV Fentanyl MEDICATIONS: Omnipaque Contrast: 105 ml  Radial Cocktail: 5 mg Verapamil, 400 mcg NTG, 2 ml 2% Lidocaine in 10 ml NS  IV Heparin: 4000 Units for Dx, 2000 additional units for FFR  Anti-Platelet Agent: 324 mg ASA  Hemodynamics:  Central Aortic / Mean Pressures: 119/58 mmHg; 81 mmHg  Left Ventricular Pressures / EDP: 121/5 mmHg; 16 mmHg  Left Ventriculography:  EF: 60-65 %  Wall Motion: normal  Coronary Anatomy:  Left Main: Very Large Caliber, tapers to ~10-20% distal stenosis before  bifurcating to LAD & Circumflex LAD: Moderate to  Large caliber vessel that has an ostial ~30% stenosis before giving off a proximal D1 that actually courses as a Ramus Intermedius/OM.  Shortly after this branch there is a tubular ~40% stenosis just proximal to a cluster of 3 septal perforators, a small caliber D2 and a moderate caliber D2.   D1: Small-caliber vessel that courses across the heart and to the ramus intermedius/OM distribution. Diffuse small vessel disease but nothing significant.  D2: Small-caliber vessel diffusely diseased with less than 50% stenosis.  D3:  This is the main diagonal branch of the LAD. It courses along the distal anterolateral wall. After a roughly 40% ostial lesion, there are mild luminal irregularities in one of its branches but in itself is relatively free of significant heart disease. Left Circumflex: Large-caliber vessel that gives off the moderate caliber OM1 in the early mid segment before coursing into the AV groove where it bifurcates distally into OM 2 and LPL 1 both of which courses along the inferolateral wall.  There are minimal luminal irregularities in these vessels each of which have small branching components.   RCA: Large-caliber dominant vessel that has a sharp turn the proximal segment. At the a small atrial branch there is a tubular 20-30% stenosis. In the mid vessel there is a double branching RV marginal branch. Following this just prior to the crux there is a 60-70% focal/napkin ring stenosis. The vessel then is somewhat tortuous in its distal course before bifurcates into the PDA and the Right Posterior AV Groove Branch.  After reviewing the RCA angiography, the decision was made to proceed with FFR measurement of the mid RCA lesion as described above.  The result was FFR of 0.84 which is not physiologically significant.  RPDA: Moderate large-caliber which reaches almost to the apex.  Minimal luminal irregularities.  RPL Sysytem:The Right  Posterior AV Groove Branch bifurcates into a major left to right posterolateral branch and a smaller second posterolateral branch the gives rise to the AV nodal artery. Minimal luminal irregularities.  EBL:   < 10 ml  PATIENT DISPOSITION:    The patient was transferred to the PACU holding area in a hemodynamicaly stable, chest pain free condition.  The patient tolerated the procedure well, and there were no complications.  The patient was stable before, during, and after the procedure.  POST-OPERATIVE DIAGNOSIS:    Moderate mid RCA lesion, not physiologically significant.  No obvious culprit lesion for unstable angina -- non-anginal pain vs. Coronary spasm.  Normal EF & EDP  PLAN OF CARE:  Overnight Observation for post cath monitoring & medication adjustment.  Anticipate d/c in AM.   Marykay Lex, M.D., M.S. [facility] [address] [place], [place]  [address]  [phone]  03/17/2012 6:12 PM

## 2012-03-17 NOTE — ED Notes (Addendum)
Pt woke up at 0300 with left shoulder pain. Pt went to Dr. Isidore Moos for routine blood work for DM and they found EKG changes.  Pt feels that the pain is similar to what he experienced after his rotator cuff surgery of left shoulder. Pt has this pain intermittently when it "flares up". Pt denies CP but states he has intermittent "heart burn" but not at this time.

## 2012-03-17 NOTE — H&P (Signed)
[facility] is an 67 y.o. male.    Cardiologist:  Dr. Herbie Baltimore PCP:  Dr. Yehuda Budd  Chief Complaint: lt shoulder pain and chest pain with exertion  HPI: 30 YOAAM was awakened from sleep with Lt shoulder pain. 7/10 in severity.  Went to his primary MD for previous appt.  He had also noted "indigestion" with walk this am which was strange in that he had not had his coffee or anything to cause indigestion.  He told ER MD that some was in ant. Chest.  No nausea, vomiting or SOB or diaphoresis.    Due to new RBBB pt was sent to the ER.  Here shoulder pain continues.  NTG paste was applied and his pain went from 7/10 to 3/10, 1 sl NTG without much change.  Keeping NPO.  Pt denies CAD.      Past Medical History  Diagnosis Date  . Diabetes mellitus without complication   . Hypertension   . Hyperlipidemia   . Coronary artery disease   . Chest pain on exertion 03/17/2012  . HTN (hypertension) 03/17/2012  . DM (diabetes mellitus) 03/17/2012  . RBBB, 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    Past Surgical History  Procedure Date  . Shoulder arthroscopy   . Appendectomy   . Carpal tunnel release   . Hemorrhoid surgery     Family History  Problem Relation Age of Onset  . Coronary artery disease Father    Social History:  reports that he has been smoking.  He does not have any smokeless tobacco history on file. He reports that he drinks alcohol. He reports that he uses illicit drugs (Marijuana). married.  Allergies: No Known Allergies  Outpatient Medications: Asa 81 mg daily crestor 40 mg daily Depo-testerone 200 mg/ml IM every 2 weeks diovan hctz 320/25 dialy Fish oil 1200 mg 1 daily Glipizide XL 10 mg daily  Vit D 3 daily   Results for orders placed during the hospital encounter of 03/17/12 (from the past 48 hour(s))  CBC WITH DIFFERENTIAL     Status: Normal   Collection Time   03/17/12 11:52 AM      Component Value Range Comment   WBC 8.8  4.0 - 10.5 K/uL    RBC 5.75  4.22 - 5.81 MIL/uL    Hemoglobin 16.9  13.0 - 17.0 g/dL    HCT 45.4  09.8 - 11.9 %    MCV 82.8  78.0 - 100.0 fL    MCH 29.4  26.0 - 34.0 pg    MCHC 35.5  30.0 - 36.0 g/dL    RDW 14.7  82.9 - 56.2 %    Platelets 241  150 - 400 K/uL    Neutrophils Relative 71  43 - 77 %    Neutro Abs 6.3  1.7 - 7.7 K/uL    Lymphocytes Relative 22  12 - 46 %    Lymphs Abs 2.0  0.7 - 4.0 K/uL    Monocytes Relative 5  3 - 12 %    Monocytes Absolute 0.4  0.1 - 1.0 K/uL    Eosinophils Relative 1  0 - 5 %    Eosinophils Absolute 0.1  0.0 - 0.7 K/uL    Basophils Relative 0  0 - 1 %    Basophils Absolute 0.0  0.0 - 0.1 K/uL   BASIC METABOLIC PANEL     Status: Abnormal   Collection Time   03/17/12 11:52 AM      Component Value Range Comment   Sodium  142  135 - 145 mEq/L    Potassium 3.7  3.5 - 5.1 mEq/L    Chloride 103  96 - 112 mEq/L    CO2 29  19 - 32 mEq/L    Glucose, Bld 93  70 - 99 mg/dL    BUN 22  6 - 23 mg/dL    Creatinine, Ser 4.09  0.50 - 1.35 mg/dL    Calcium 81.1  8.4 - 10.5 mg/dL    GFR calc non Af Amer 55 (*) >90 mL/min    GFR calc Af Amer 64 (*) >90 mL/min   POCT I-STAT TROPONIN I     Status: Normal   Collection Time   03/17/12 12:09 PM      Component Value Range Comment   Troponin i, poc 0.01  0.00 - 0.08 ng/mL    Comment 3             Dg Chest 2 View  03/17/2012  *RADIOLOGY REPORT*  Clinical Data: Chest pain.  CHEST - 2 VIEW  Comparison: Jun 24, 2008.  Findings: Cardiomediastinal silhouette appears normal.  No acute pulmonary disease is noted.  Bony thorax is intact.  IMPRESSION: No acute cardiopulmonary abnormality seen.   Original Report Authenticated By: Lupita Raider.,  M.D.     ROS: General:no colds or fevers, no weight changes Skin:no rashes or ulcers HEENT:no blurred vision, no congestion CV:see HPI PUL:see HPI GI:no diarrhea constipation or melena, + indigestion with ambulation. GU:no hematuria, no dysuria MS:no joint pain, no claudication Neuro:no syncope, no lightheadedness Endo:+ diabetes, no  thyroid disease    Blood pressure 134/57, pulse 59, temperature 98.1 F (36.7 C), temperature source Oral, resp. rate 19, SpO2 98.00%. PE: General: Alert and oriented, NAD pleasant affect Skin:warm and dry, brisk capillary refill HEENT:normocephalic, sclera clear Neck: Lt carotid bruit, no JVD Heart:S1S2 RRR without murmur gallup rub or click Lungs:no rales rhonchi or wheezes Abd:+ BS, soft, non tender Ext:no edema, 2+ pedal pulses Neuro:alert and oriented X 3  MAE, follows commands    Assessment/Plan Principal Problem:  *Shoulder pain, acute, wakened from sleep with pain, anginal equivilant  Active Problems:  Chest pain on exertion  HTN (hypertension)  DM (diabetes mellitus)  RBBB, NEW  PLAN:  Admit to rule out MI with Shoulder pain possible anginal equivalent,  Multiple risk factors.  Add IV heparin.  Continue NTG paste. Serial troponins.  NPO for now.   Admit to step down.  Possible cardiac cath today.  MD to see. Will add BB.  INGOLD,LAURA R 03/17/2012, 2:23 PM

## 2012-03-17 NOTE — Progress Notes (Signed)
Pt. Had episode of 2nd degree AVB type 1 asymptomatic, 12 lead EKG done shown SB with RBBB, BP stable L. Ingold PA made aware , O2 administered at 2l.  Since pt. has not wearing his bipap  for his sleep apnea.

## 2012-03-17 NOTE — ED Notes (Addendum)
Pt presents with onset of L shoulder/arm pain that woke him from sleep this  Morning.  Pt was at MD office for routine glucose check, had EKG performed due to arm pain complaint and found to have EKG changes.  EMS called to MD office, pt declined transport.  Pt denies any chest discomfort, does complain of heart burn; denies any shortness of breath or nausea.  ASA 324mg  given at office.

## 2012-03-17 NOTE — Progress Notes (Signed)
Had another episode of severe brady HR<30, BP stable remain asymptomatic L. Ingold made aware with order for bipap at bedtime.

## 2012-03-18 ENCOUNTER — Encounter (HOSPITAL_COMMUNITY): Payer: Self-pay | Admitting: Cardiology

## 2012-03-18 DIAGNOSIS — G4733 Obstructive sleep apnea (adult) (pediatric): Secondary | ICD-10-CM

## 2012-03-18 DIAGNOSIS — I441 Atrioventricular block, second degree: Secondary | ICD-10-CM | POA: Clinically undetermined

## 2012-03-18 DIAGNOSIS — I2510 Atherosclerotic heart disease of native coronary artery without angina pectoris: Secondary | ICD-10-CM

## 2012-03-18 DIAGNOSIS — I200 Unstable angina: Secondary | ICD-10-CM | POA: Diagnosis not present

## 2012-03-18 HISTORY — DX: Obstructive sleep apnea (adult) (pediatric): G47.33

## 2012-03-18 HISTORY — DX: Atherosclerotic heart disease of native coronary artery without angina pectoris: I25.10

## 2012-03-18 LAB — TROPONIN I: Troponin I: <0.3 ng/mL (ref <0.30)

## 2012-03-18 LAB — GLUCOSE, CAPILLARY: Glucose-Capillary: 62 mg/dL — ABNORMAL LOW (ref 70–99)

## 2012-03-18 LAB — LIPID PANEL
Total CHOL/HDL Ratio: 3.4 RATIO
VLDL: 39 mg/dL (ref 0–40)

## 2012-03-18 LAB — HEMOGLOBIN A1C: Hgb A1c MFr Bld: 6.1 % — ABNORMAL HIGH (ref <5.7)

## 2012-03-18 MED ORDER — NITROGLYCERIN 0.3 MG SL SUBL
0.3000 mg | SUBLINGUAL_TABLET | SUBLINGUAL | Status: DC | PRN
  Filled 2012-03-18: qty 100

## 2012-03-18 MED ORDER — ISOSORBIDE MONONITRATE ER 30 MG PO TB24
30.0000 mg | ORAL_TABLET | Freq: Every day | ORAL | Status: DC
  Administered 2012-03-18: 30 mg via ORAL
  Filled 2012-03-18 (×2): qty 1

## 2012-03-18 MED ORDER — GLUCOSE 40 % PO GEL
ORAL | Status: AC
  Administered 2012-03-18: 37.5 g
  Filled 2012-03-18: qty 1

## 2012-03-18 MED ORDER — NITROGLYCERIN 0.3 MG SL SUBL: 0.3000 mg | SUBLINGUAL_TABLET | SUBLINGUAL | Status: DC | PRN

## 2012-03-18 MED ORDER — ISOSORBIDE MONONITRATE ER 30 MG PO TB24: 30.0000 mg | ORAL_TABLET | Freq: Every day | ORAL | Status: DC

## 2012-03-18 NOTE — Progress Notes (Signed)
Placed patient on CPAP QHS on his home settings at 10 cm H2O, full face mask and humidity.  Patient tolerated CPAP well

## 2012-03-18 NOTE — Discharge Summary (Signed)
Physician Discharge Summary  Patient ID: James Jones MRN: [id number] DOB/AGE: May 01, 1945 67 y.o.  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 Herbie Baltimore.  Hospital Course: 67 y/o followed for cardiac RFs of DM-2, HTN, HLD & OSA, tobacco abuse who is a relatively active man walking ~51miles a day. He was in his USOH until he awoke @ ~3AM 03/17/2012 with sudden onset L shoulder Ache that he initially attributed to prior rotator cuff Sgx. Pain waxed & waned (completely abating) throughout the AM -- was able todo his 4 mile walk, but was then troubled by "heartburn Sx" that were "a bit different" than his usual Sx while on his walk -- he had not eaten since PCP was planning on checking labs. When he told his PCP about these Sx, ECG checked -- New from 07/2010 RBBB with non-specific ST-changes, but sent to [facility] ED for evaluation. Pain improved from 8/10 to 2-3/10 after NTG & NTG past.  Due to multiple risk factors and symptoms, Dr. Herbie Baltimore did cardiac cath for cardiac evaluation.   Coronary Anatomy:  Left Main: Very Large Caliber, tapers to ~10-20% distal stenosis before bifurcating to LAD & Circumflex LAD: Moderate to Large caliber vessel that has an ostial ~30% stenosis before giving off a proximal D1 that actually courses as a Ramus Intermedius/OM. Shortly after this branch there is a tubular ~40% stenosis just proximal to a cluster of 3 septal perforators, a small caliber D2 and a moderate  caliber D2.  D1: Small-caliber vessel that courses across the heart and to the ramus intermedius/OM distribution. Diffuse small vessel disease but nothing significant.  D2: Small-caliber vessel diffusely diseased with less than 50% stenosis.  D3: This is the main diagonal branch of the LAD. It courses along the distal anterolateral wall. After a roughly 40% ostial lesion, there are mild luminal irregularities in one of its branches but in itself is relatively free of significant heart disease. Left Circumflex: Large-caliber vessel that gives off the moderate caliber OM1 in the early mid segment before coursing into the AV groove where it bifurcates distally into OM 2 and LPL 1 both of which courses along the inferolateral wall. There are minimal luminal irregularities in these vessels each of which have small branching components.  RCA: Large-caliber dominant vessel that has a sharp turn the proximal segment. At the a small atrial branch there is a tubular 20-30% stenosis. In the mid vessel there is a double branching RV marginal branch. Following this just prior to the crux there is a 60-70% focal/napkin ring stenosis. The vessel then is somewhat tortuous in its distal course before bifurcates into the PDA and the Right Posterior AV Groove Branch.  After reviewing the RCA angiography, the decision was made to proceed with FFR measurement of the mid RCA lesion as described above. The result was FFR of 0.84 which is not physiologically significant.  RPDA: Moderate large-caliber which reaches almost to the apex. Minimal luminal irregularities.  RPL Sysytem:The Right Posterior AV Groove Branch bifurcates into a major left to right posterolateral branch and a smaller second posterolateral branch the gives rise to the AV nodal artery. Minimal luminal irregularities.   Pt was placed in 6500 for overnight observation.  He developed bradycardia with HR to 40's then he did develop 2:1 block, also non conducted  PACs 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.  VS were stable.  Cpap was arranged with oxygen during the night and pt had no further episode of bradycardia or block.  The next am Dr. Herbie Baltimore discussed importance of Cpap, stopping tobacco.  We arranged new cpap machine to go home with pt.  We will arrange split sleep study in our office with follow up with Dr. Tresa Endo.  He will also continue to follow with Dr. Herbie Baltimore for cardiology.  Pt was stable AM of discharge, ambulated, seen and felt to be stable by Dr. Herbie Baltimore.  Imdur was added to medical regimen.   Prior to discharge he developed hypoglycemia.  Given sandwich.  He had been without food most of 02/15/12 and is on glipizide.  Once glucose stabilized pt was discharged.  Consults: None  Significant Diagnostic Studies:  BMET    Component Value Date/Time   NA 142 03/17/2012 1152   K 3.7 03/17/2012 1152   CL 103 03/17/2012 1152   CO2 29 03/17/2012 1152   GLUCOSE 93 03/17/2012 1152   BUN 22 03/17/2012 1152   CREATININE 1.30 03/17/2012 1152   CALCIUM 10.1 03/17/2012 1152   GFRNONAA 55* 03/17/2012 1152   GFRAA 64* 03/17/2012 1152    CBC    Component Value Date/Time   WBC 8.8 03/17/2012 1152   RBC 5.75 03/17/2012 1152   HGB 16.9 03/17/2012 1152   HCT 47.6 03/17/2012 1152   PLT 241 03/17/2012 1152   MCV 82.8 03/17/2012 1152   MCH 29.4 03/17/2012 1152   MCHC 35.5 03/17/2012 1152   RDW 14.1 03/17/2012 1152   LYMPHSABS 2.0 03/17/2012 1152   MONOABS 0.4 03/17/2012 1152   EOSABS 0.1 03/17/2012 1152   BASOSABS 0.0 03/17/2012 1152    Troponin's all negative.  T. Chol 182, TG 193, HDL 53, LDL 90.  Hgb A1c 6.1  CXR 2 view: Comparison: Jun 24, 2008.  Findings: Cardiomediastinal silhouette appears normal. No acute  pulmonary disease is noted. Bony thorax is intact.  IMPRESSION:  No acute cardiopulmonary abnormality seen.    Discharge Exam: Blood pressure 112/57, pulse 56, temperature 97.6 F (36.4 C), temperature source Oral, resp. rate 12, height 5\' 8"  (1.727 m), weight 87.8 kg (193 lb 9 oz), SpO2  93.00%.  AM exam:  General appearance: alert, cooperative, appears stated age and no distress  Neck: no adenopathy, no carotid bruit and no JVD  Lungs: clear to auscultation bilaterally and normal percussion bilaterally  Heart: S1, S2 normal and Bradycardic with no murmurs rubs or gallops  Abdomen: soft, non-tender; bowel sounds normal; no masses, no organomegaly  Extremities: extremities normal, atraumatic, no cyanosis or edema  Pulses: 2+ and symmetric  Neurologic: Grossly normal     Disposition: home    Medication List    TAKE these medications       aspirin 325 MG tablet  Take 325 mg by mouth daily.     fish oil-omega-3 fatty acids 1000 MG capsule  Take 1 g by mouth daily.     glipiZIDE 10 MG tablet  Commonly known as:  GLUCOTROL  Take 10 mg by mouth 2 (two) times daily before a meal.  isosorbide mononitrate 30 MG 24 hr tablet  Commonly known as:  IMDUR  Take 1 tablet (30 mg total) by mouth daily.     multivitamin with minerals Tabs  Take 1 tablet by mouth daily.     nitroGLYCERIN 0.3 MG SL tablet  Commonly known as:  NITROSTAT  Place 1 tablet (0.3 mg total) under the tongue every 5 (five) minutes as needed for chest pain.     rosuvastatin 40 MG tablet  Commonly known as:  CRESTOR  Take 40 mg by mouth daily.     testosterone cypionate 200 MG/ML injection  Commonly known as:  DEPOTESTOTERONE CYPIONATE  Inject 100 mg into the muscle every 14 (fourteen) days.     valsartan 320 MG tablet  Commonly known as:  DIOVAN  Take 320 mg by mouth daily.           Follow-up Information   Follow up with [facility] R, NP. (the office will call with date and time, this is Dr. Elissa Hefty Nurse Practitioner )    Contact information:   [address] [place] [address] [phone]       Follow up with Marykay Lex, MD. (our office will arrange sleep study for cpap.)    Contact information:   [address] [place] [address] [phone]      Discharge Instructions:  Call [facility] if any bleeding, swelling or drainage at cath site.  May shower, no tub baths for 48 hours for groin sticks.   No lifting over 5 pounds for 3 days.  No driving for 2 days.  You need to wear your cpap.    SignedLeone Brand 03/18/2012, 1:44 PM

## 2012-03-18 NOTE — Progress Notes (Signed)
I have seen and evaluated the patient this AM along with Nada Boozer, NP. I agree with her findings, examination as well as impression recommendations.  He tolerated catheterization well. His wrist site is stable. No further shoulder or heartburn pain yesterday.  General appearance: alert, cooperative, appears stated age and no distress Neck: no adenopathy, no carotid bruit and no JVD Lungs: clear to auscultation bilaterally and normal percussion bilaterally Heart: S1, S2 normal and Bradycardic with no murmurs rubs or gallops Abdomen: soft, non-tender; bowel sounds normal; no masses,  no organomegaly Extremities: extremities normal, atraumatic, no cyanosis or edema Pulses: 2+ and symmetric Neurologic: Grossly normal  He is essentially doing very well. However he did have episodes of bradycardia with blocked sinus beats on telemetry 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. This all resolved once was put on his CPAP. This confirms presence of sleep apnea.  With only moderate coronary disease on catheterization, his chest pain could potentially be either noncardiac versus coronary spasm. His blood pressure is well-controlled on the Diovan, at present I would prefer not add a calcium channel blocker to avoid hypotension. I will therefore add long-acting Imdur with when necessary nitroglycerin sublingual. With his baseline bradycardia and tendency to profound bradycardia night when on CPAP, would not use non-dihydropyridine calcium channel blockers or beta blockers.  We'll ensure that he has adequate CPAP equipment for his machine upon discharge. He'll be established with Dr. Tresa Endo at [facility] to followup his sleep apnea. He barely get out of test 2 years ago at the [facility] that was only told to use his CPAP. Therefore he would prefer not to follow up with them.  He is otherwise ready for discharge. We'll have him wear monitor for a period of 2 weeks to further evaluate bradycardia. My  suspicion is that he will not have further episodes while on CPAP at night.  Marykay Lex, M.D., M.S. [facility] [address] [place], [place]  [address]  [phone] Pager # [phone] 03/18/2012 9:09 AM

## 2012-03-18 NOTE — Progress Notes (Signed)
EPIC was down from midnight till 5 am.  Pt had episode of bradycardia, HR 54, patient asymptomatic.  At 0036, patient HR dropped to 43, 1.86 pause. Patient sleeping, Respiratory applied CPAP that was ordered around 0015.  After placement of CPAP, patient had no further episodes.

## 2012-03-18 NOTE — Progress Notes (Signed)
Pt was ready for discharge home he was diaphoretic I checked his CBG he was 60 I gave him glucose juice and food. I continued to monitor CBG  Final was 83 he states he feels much better and ready to go. He appears to feel better asymptomatic. I discussed with him signs of hypoglycemic and and what to do. Male at bedside she too understands what to do for glucose supplement if hypoglycemic. Cpap arranged with oxygen delivered. Patient ready for discharge Nada Boozer PA updated of all the above mentioned.

## 2012-03-18 NOTE — Discharge Summary (Signed)
Cardiac cath showed moderate RCA lesion only. Overnight bradycardia associated with OSA -- CPAP equipment supplied.  Will need to see if he needs redo sleep study.  No BP room to add CCB - will add Imdur & PRN NTG.  ROV with me in ~2-3 weeks.  Marykay Lex, M.D., M.S. [facility] [address] [place], [place]  [address]  [phone] Pager # [phone] 03/18/2012 9:58 PM

## 2012-03-18 NOTE — Progress Notes (Signed)
NCM spoke to pt and states his CPAP was purchased 7 to 8 years ago. He currently does not have any of the tubing for the CPAP. Unit RN will notify MD and order new CPAP for home. Notified [facility] for CPAP for home. Isidoro Donning RN CCM Case Mgmt phone [phone]

## 2012-03-18 NOTE — Progress Notes (Signed)
Subjective: Slept with C Pap, rec'd no BB yesterday.  Objective: Vital signs in last 24 hours: Temp:  [97.3 F (36.3 C)-98.8 F (37.1 C)] 97.6 F (36.4 C) (02/08 0737) Pulse Rate:  [45-67] 56 (02/08 0737) Resp:  [12-19] 12 (02/08 0737) BP: (109-159)/(47-72) 112/57 mmHg (02/08 0737) SpO2:  [93 %-100 %] 93 % (02/08 0737) FiO2 (%):  [2 %] 2 % (02/07 2225) Weight:  [85.276 kg (188 lb)-87.8 kg (193 lb 9 oz)] 87.8 kg (193 lb 9 oz) (02/08 0500) Weight change:  Last BM Date: 03/17/12 Intake/Output from previous day: +255 02/07 0701 - 02/08 0700 In: 255.9 [I.V.:255.9] Out: -  Intake/Output this shift:    PE: per Dr. Herbie Baltimore   Lab Results:  Recent Labs  03/17/12 1152  WBC 8.8  HGB 16.9  HCT 47.6  PLT 241   BMET  Recent Labs  03/17/12 1152  NA 142  K 3.7  CL 103  CO2 29  GLUCOSE 93  BUN 22  CREATININE 1.30  CALCIUM 10.1    Recent Labs  03/17/12 2056 03/18/12 0320  TROPONINI <0.30 <0.30    Lab Results  Component Value Date   CHOL 182 03/18/2012   HDL 53 03/18/2012   LDLCALC 90 03/18/2012   TRIG 193* 03/18/2012   CHOLHDL 3.4 03/18/2012   Lab Results  Component Value Date   HGBA1C 6.1* 03/17/2012     Lab Results  Component Value Date   TSH 1.625 03/17/2012    Recent Labs  03/18/12 0320  CHOL 182   Studies/Results: Dg Chest 2 View  03/17/2012  *RADIOLOGY REPORT*  Clinical Data: Chest pain.  CHEST - 2 VIEW  Comparison: Jun 24, 2008.  Findings: Cardiomediastinal silhouette appears normal.  No acute pulmonary disease is noted.  Bony thorax is intact.  IMPRESSION: No acute cardiopulmonary abnormality seen.   Original Report Authenticated By: Lupita Raider.,  M.D.   cadiac cath 03/17/12: POST-OPERATIVE DIAGNOSIS:  Moderate mid RCA lesion, not physiologically significant.  No obvious culprit lesion for unstable angina -- non-anginal pain vs. Coronary spasm.  Normal EF & EDP   Medications: I have reviewed the patient's current medications. Marland Kitchen aspirin  325 mg Oral  Daily  . atorvastatin  80 mg Oral q1800  . glipiZIDE  10 mg Oral BID AC  . insulin aspart  0-9 Units Subcutaneous TID WC  . irbesartan  300 mg Oral Daily  . isosorbide mononitrate  30 mg Oral Daily  . nitroGLYCERIN  1 inch Topical Q6H  . 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    Ambulate then d/c home.  Follow up with sleep clinic, our office will arrange secondary to hx of known sleep apnea has been intolerant to cpap, now with 2: 1 block with sleep resolved with cpap.  Will attempt to arrange cpap for home use today.  Dr. Herbie Baltimore to see, plan d/c after ambulation.  LOS: 1 day   Kaeya Schiffer R 03/18/2012, 8:36 AM

## 2012-03-30 DIAGNOSIS — G4733 Obstructive sleep apnea (adult) (pediatric): Secondary | ICD-10-CM | POA: Diagnosis not present

## 2012-03-30 DIAGNOSIS — R0989 Other specified symptoms and signs involving the circulatory and respiratory systems: Secondary | ICD-10-CM | POA: Diagnosis not present

## 2012-03-30 DIAGNOSIS — R0609 Other forms of dyspnea: Secondary | ICD-10-CM | POA: Diagnosis not present

## 2012-03-30 DIAGNOSIS — G4730 Sleep apnea, unspecified: Secondary | ICD-10-CM | POA: Diagnosis not present

## 2012-04-04 DIAGNOSIS — Z0000 Encounter for general adult medical examination without abnormal findings: Secondary | ICD-10-CM | POA: Diagnosis not present

## 2012-04-04 DIAGNOSIS — I10 Essential (primary) hypertension: Secondary | ICD-10-CM | POA: Diagnosis not present

## 2012-04-04 DIAGNOSIS — E291 Testicular hypofunction: Secondary | ICD-10-CM | POA: Diagnosis not present

## 2012-04-04 DIAGNOSIS — N390 Urinary tract infection, site not specified: Secondary | ICD-10-CM | POA: Diagnosis not present

## 2012-04-04 DIAGNOSIS — Z79899 Other long term (current) drug therapy: Secondary | ICD-10-CM | POA: Diagnosis not present

## 2012-04-04 DIAGNOSIS — R3129 Other microscopic hematuria: Secondary | ICD-10-CM | POA: Insufficient documentation

## 2012-04-04 DIAGNOSIS — E119 Type 2 diabetes mellitus without complications: Secondary | ICD-10-CM | POA: Diagnosis not present

## 2012-04-04 DIAGNOSIS — E782 Mixed hyperlipidemia: Secondary | ICD-10-CM | POA: Diagnosis not present

## 2012-04-09 DIAGNOSIS — I4589 Other specified conduction disorders: Secondary | ICD-10-CM | POA: Diagnosis not present

## 2012-04-12 ENCOUNTER — Emergency Department (HOSPITAL_COMMUNITY)
Admission: EM | Admit: 2012-04-12 | Discharge: 2012-04-12 | Disposition: A | Discharge status: Home / Self Care | Payer: Medicare Other | Attending: Emergency Medicine | Admitting: Emergency Medicine

## 2012-04-12 ENCOUNTER — Encounter (HOSPITAL_COMMUNITY): Payer: Self-pay | Admitting: Emergency Medicine

## 2012-04-12 DIAGNOSIS — I129 Hypertensive chronic kidney disease with stage 1 through stage 4 chronic kidney disease, or unspecified chronic kidney disease: Secondary | ICD-10-CM | POA: Insufficient documentation

## 2012-04-12 DIAGNOSIS — Z79899 Other long term (current) drug therapy: Secondary | ICD-10-CM | POA: Insufficient documentation

## 2012-04-12 DIAGNOSIS — E119 Type 2 diabetes mellitus without complications: Secondary | ICD-10-CM | POA: Diagnosis not present

## 2012-04-12 DIAGNOSIS — R221 Localized swelling, mass and lump, neck: Secondary | ICD-10-CM | POA: Diagnosis not present

## 2012-04-12 DIAGNOSIS — Z87891 Personal history of nicotine dependence: Secondary | ICD-10-CM | POA: Insufficient documentation

## 2012-04-12 DIAGNOSIS — E785 Hyperlipidemia, unspecified: Secondary | ICD-10-CM | POA: Diagnosis not present

## 2012-04-12 DIAGNOSIS — N182 Chronic kidney disease, stage 2 (mild): Secondary | ICD-10-CM | POA: Diagnosis not present

## 2012-04-12 DIAGNOSIS — F17200 Nicotine dependence, unspecified, uncomplicated: Secondary | ICD-10-CM | POA: Diagnosis not present

## 2012-04-12 DIAGNOSIS — I2510 Atherosclerotic heart disease of native coronary artery without angina pectoris: Secondary | ICD-10-CM | POA: Diagnosis not present

## 2012-04-12 DIAGNOSIS — Z7982 Long term (current) use of aspirin: Secondary | ICD-10-CM | POA: Diagnosis not present

## 2012-04-12 DIAGNOSIS — Z8679 Personal history of other diseases of the circulatory system: Secondary | ICD-10-CM | POA: Diagnosis not present

## 2012-04-12 DIAGNOSIS — G4733 Obstructive sleep apnea (adult) (pediatric): Secondary | ICD-10-CM | POA: Diagnosis not present

## 2012-04-12 DIAGNOSIS — K115 Sialolithiasis: Secondary | ICD-10-CM | POA: Diagnosis not present

## 2012-04-12 DIAGNOSIS — R220 Localized swelling, mass and lump, head: Secondary | ICD-10-CM | POA: Diagnosis not present

## 2012-04-12 NOTE — ED Notes (Signed)
Onset today took a bite of oatmeal and sudden onset of right facial swelling. Pain currently 3/10 achy pain airway intact bilateral equal chest rise and fall.

## 2012-04-12 NOTE — ED Provider Notes (Signed)
History     CSN: [id number]  Arrival date & time 04/12/12  1003   First MD Initiated Contact with Patient 04/12/12 1106      Chief Complaint  Patient presents with  . Facial Swelling    (Consider location/radiation/quality/duration/timing/severity/associated sxs/prior treatment) HPI Pt reports he was in his normal state of health this AM, went to the gym, etc. He was eating some oatmeal just prior to arrival when he had sudden onset of moderate to severe selling of the right face, minimal pain, no tenderness. Swelling has improved some since onset. No fever. No trauma. He has never had similar problems before. No throat closing or difficulty swallowing.   Past Medical History  Diagnosis Date  . Diabetes mellitus without complication   . Hypertension   . Hyperlipidemia   . Coronary artery disease   . Chest pain on exertion 03/17/2012  . HTN (hypertension) 03/17/2012  . DM (diabetes mellitus) 03/17/2012  . 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 OSA (obstructive sleep apnea), has been cpap intolerant 03/18/2012  . 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 CAD (coronary artery disease), with 60-70% stenosis in RCA 03/18/2012    Past Surgical History  Procedure Laterality Date  . Shoulder arthroscopy    . Appendectomy    . Carpal tunnel release    . Hemorrhoid surgery      Family History  Problem Relation Age of Onset  . Coronary artery disease Father     History  Substance Use Topics  . Smoking status: Current Every Day Smoker -- 2.00 packs/day  . Smokeless tobacco: Not on file  . Alcohol Use: No      Review of Systems All other systems reviewed and are negative except as noted in HPI.   Allergies  Review of patient's allergies indicates no known allergies.  Home Medications   Current Outpatient Rx  Name  Route  Sig  Dispense  Refill  . aspirin 325 MG tablet   Oral   Take 325 mg by mouth daily.          . fish oil-omega-3 fatty acids 1000 MG capsule   Oral   Take 1 g by mouth daily.         Marland Kitchen glipiZIDE (GLUCOTROL) 10 MG tablet   Oral   Take 10 mg by mouth 2 (two) times daily before a meal.         . isosorbide mononitrate (IMDUR) 30 MG 24 hr tablet   Oral   Take 1 tablet (30 mg total) by mouth daily.   30 tablet   6   . Multiple Vitamin (MULTIVITAMIN WITH MINERALS) TABS   Oral   Take 1 tablet by mouth daily.         . nitroGLYCERIN (NITROSTAT) 0.3 MG SL tablet   Sublingual   Place 1 tablet (0.3 mg total) under the tongue every 5 (five) minutes as needed for chest pain.   25 tablet   4   . rosuvastatin (CRESTOR) 40 MG tablet   Oral   Take 40 mg by mouth daily.         Marland Kitchen testosterone cypionate (DEPOTESTOTERONE CYPIONATE) 200 MG/ML injection   Intramuscular   Inject 100 mg into the muscle every 14 (fourteen) days.         . valsartan (DIOVAN) 320 MG tablet   Oral   Take 320 mg by mouth daily.  BP 153/58  Pulse 70  Temp(Src) 97.9 F (36.6 C) (Oral)  Resp 18  SpO2 98%  Physical Exam  Nursing note and vitals reviewed. Constitutional: He is oriented to person, place, and time. He appears well-developed and well-nourished.  HENT:  Head: Normocephalic and atraumatic.  Moderately enlarged, but non-tender R parotid gland, no definite stone felt at parotid duct  Eyes: EOM are normal. Pupils are equal, round, and reactive to light.  Neck: Normal range of motion. Neck supple.  Cardiovascular: Normal rate, normal heart sounds and intact distal pulses.   Pulmonary/Chest: Effort normal and breath sounds normal. No stridor.  Abdominal: Bowel sounds are normal. He exhibits no distension. There is no tenderness.  Musculoskeletal: Normal range of motion. He exhibits no edema and no tenderness.  Neurological: He is alert and oriented to person, place, and time. He has normal strength. No cranial nerve deficit or sensory deficit.  Skin: Skin is warm  and dry. No rash noted.  Psychiatric: He has a normal mood and affect.    ED Course  Procedures (including critical care time)  Labs Reviewed - No data to display No results found.   No diagnosis found.    MDM  Pt with likely sialolithiasis given sudden onset and minimal tenderness. Doubt infection or allergic reaction. Advised sour candy and ENT followup if it does not resolve spontaneously.        Charles B. Bernette Mayers, MD 04/12/12 1119

## 2012-04-18 DIAGNOSIS — R319 Hematuria, unspecified: Secondary | ICD-10-CM | POA: Diagnosis not present

## 2012-06-30 DIAGNOSIS — E119 Type 2 diabetes mellitus without complications: Secondary | ICD-10-CM | POA: Diagnosis not present

## 2012-06-30 DIAGNOSIS — I10 Essential (primary) hypertension: Secondary | ICD-10-CM | POA: Diagnosis not present

## 2012-06-30 DIAGNOSIS — E785 Hyperlipidemia, unspecified: Secondary | ICD-10-CM | POA: Diagnosis not present

## 2012-06-30 DIAGNOSIS — R109 Unspecified abdominal pain: Secondary | ICD-10-CM | POA: Diagnosis not present

## 2012-07-02 ENCOUNTER — Encounter: Payer: Self-pay | Admitting: *Deleted

## 2012-07-04 DIAGNOSIS — E119 Type 2 diabetes mellitus without complications: Secondary | ICD-10-CM | POA: Diagnosis not present

## 2012-07-04 DIAGNOSIS — R599 Enlarged lymph nodes, unspecified: Secondary | ICD-10-CM | POA: Diagnosis not present

## 2012-07-04 DIAGNOSIS — R5381 Other malaise: Secondary | ICD-10-CM | POA: Diagnosis not present

## 2012-07-04 DIAGNOSIS — R5383 Other fatigue: Secondary | ICD-10-CM | POA: Diagnosis not present

## 2012-07-04 DIAGNOSIS — E161 Other hypoglycemia: Secondary | ICD-10-CM | POA: Diagnosis not present

## 2012-07-04 DIAGNOSIS — R109 Unspecified abdominal pain: Secondary | ICD-10-CM | POA: Diagnosis not present

## 2012-07-04 DIAGNOSIS — E785 Hyperlipidemia, unspecified: Secondary | ICD-10-CM | POA: Diagnosis not present

## 2012-07-04 DIAGNOSIS — I10 Essential (primary) hypertension: Secondary | ICD-10-CM | POA: Diagnosis not present

## 2012-07-04 DIAGNOSIS — M47812 Spondylosis without myelopathy or radiculopathy, cervical region: Secondary | ICD-10-CM | POA: Diagnosis not present

## 2012-07-04 DIAGNOSIS — Z79899 Other long term (current) drug therapy: Secondary | ICD-10-CM | POA: Diagnosis not present

## 2012-07-04 DIAGNOSIS — L049 Acute lymphadenitis, unspecified: Secondary | ICD-10-CM | POA: Diagnosis not present

## 2012-07-06 ENCOUNTER — Ambulatory Visit: Payer: Medicare Other | Admitting: Cardiovascular Disease

## 2012-07-10 DIAGNOSIS — N281 Cyst of kidney, acquired: Secondary | ICD-10-CM | POA: Diagnosis not present

## 2012-07-10 DIAGNOSIS — R109 Unspecified abdominal pain: Secondary | ICD-10-CM | POA: Diagnosis not present

## 2012-07-10 DIAGNOSIS — F17200 Nicotine dependence, unspecified, uncomplicated: Secondary | ICD-10-CM | POA: Diagnosis not present

## 2012-07-21 DIAGNOSIS — N281 Cyst of kidney, acquired: Secondary | ICD-10-CM | POA: Diagnosis not present

## 2012-07-25 DIAGNOSIS — H2510 Age-related nuclear cataract, unspecified eye: Secondary | ICD-10-CM | POA: Diagnosis not present

## 2012-07-25 DIAGNOSIS — H5200 Hypermetropia, unspecified eye: Secondary | ICD-10-CM | POA: Diagnosis not present

## 2012-07-25 DIAGNOSIS — H5060 Mechanical strabismus, unspecified: Secondary | ICD-10-CM | POA: Diagnosis not present

## 2012-07-25 DIAGNOSIS — E119 Type 2 diabetes mellitus without complications: Secondary | ICD-10-CM | POA: Diagnosis not present

## 2012-08-01 ENCOUNTER — Encounter: Payer: Self-pay | Admitting: Cardiology

## 2012-08-03 ENCOUNTER — Encounter (HOSPITAL_COMMUNITY): Payer: Self-pay | Admitting: Emergency Medicine

## 2012-08-03 ENCOUNTER — Emergency Department (HOSPITAL_COMMUNITY): Payer: No Typology Code available for payment source

## 2012-08-03 ENCOUNTER — Emergency Department (HOSPITAL_COMMUNITY)
Admission: EM | Admit: 2012-08-03 | Discharge: 2012-08-03 | Disposition: A | Discharge status: Home / Self Care | Payer: No Typology Code available for payment source | Attending: Emergency Medicine | Admitting: Emergency Medicine

## 2012-08-03 DIAGNOSIS — S46909A Unspecified injury of unspecified muscle, fascia and tendon at shoulder and upper arm level, unspecified arm, initial encounter: Secondary | ICD-10-CM | POA: Insufficient documentation

## 2012-08-03 DIAGNOSIS — M542 Cervicalgia: Secondary | ICD-10-CM | POA: Diagnosis not present

## 2012-08-03 DIAGNOSIS — Z9861 Coronary angioplasty status: Secondary | ICD-10-CM | POA: Insufficient documentation

## 2012-08-03 DIAGNOSIS — G4733 Obstructive sleep apnea (adult) (pediatric): Secondary | ICD-10-CM | POA: Insufficient documentation

## 2012-08-03 DIAGNOSIS — M25529 Pain in unspecified elbow: Secondary | ICD-10-CM | POA: Diagnosis not present

## 2012-08-03 DIAGNOSIS — Z87891 Personal history of nicotine dependence: Secondary | ICD-10-CM | POA: Insufficient documentation

## 2012-08-03 DIAGNOSIS — S6990XA Unspecified injury of unspecified wrist, hand and finger(s), initial encounter: Secondary | ICD-10-CM | POA: Diagnosis not present

## 2012-08-03 DIAGNOSIS — S199XXA Unspecified injury of neck, initial encounter: Secondary | ICD-10-CM | POA: Insufficient documentation

## 2012-08-03 DIAGNOSIS — Z7982 Long term (current) use of aspirin: Secondary | ICD-10-CM | POA: Insufficient documentation

## 2012-08-03 DIAGNOSIS — E119 Type 2 diabetes mellitus without complications: Secondary | ICD-10-CM | POA: Insufficient documentation

## 2012-08-03 DIAGNOSIS — S0993XA Unspecified injury of face, initial encounter: Secondary | ICD-10-CM | POA: Insufficient documentation

## 2012-08-03 DIAGNOSIS — Z8679 Personal history of other diseases of the circulatory system: Secondary | ICD-10-CM | POA: Insufficient documentation

## 2012-08-03 DIAGNOSIS — Z79899 Other long term (current) drug therapy: Secondary | ICD-10-CM | POA: Insufficient documentation

## 2012-08-03 DIAGNOSIS — I2510 Atherosclerotic heart disease of native coronary artery without angina pectoris: Secondary | ICD-10-CM | POA: Insufficient documentation

## 2012-08-03 DIAGNOSIS — S4980XA Other specified injuries of shoulder and upper arm, unspecified arm, initial encounter: Secondary | ICD-10-CM | POA: Diagnosis not present

## 2012-08-03 DIAGNOSIS — Y92410 Unspecified street and highway as the place of occurrence of the external cause: Secondary | ICD-10-CM | POA: Insufficient documentation

## 2012-08-03 DIAGNOSIS — S59919A Unspecified injury of unspecified forearm, initial encounter: Secondary | ICD-10-CM | POA: Diagnosis not present

## 2012-08-03 DIAGNOSIS — M25519 Pain in unspecified shoulder: Secondary | ICD-10-CM | POA: Diagnosis not present

## 2012-08-03 DIAGNOSIS — I129 Hypertensive chronic kidney disease with stage 1 through stage 4 chronic kidney disease, or unspecified chronic kidney disease: Secondary | ICD-10-CM | POA: Insufficient documentation

## 2012-08-03 DIAGNOSIS — M25511 Pain in right shoulder: Secondary | ICD-10-CM

## 2012-08-03 DIAGNOSIS — Y9389 Activity, other specified: Secondary | ICD-10-CM | POA: Insufficient documentation

## 2012-08-03 DIAGNOSIS — E785 Hyperlipidemia, unspecified: Secondary | ICD-10-CM | POA: Insufficient documentation

## 2012-08-03 DIAGNOSIS — N182 Chronic kidney disease, stage 2 (mild): Secondary | ICD-10-CM | POA: Insufficient documentation

## 2012-08-03 MED ORDER — TRAMADOL HCL 50 MG PO TABS: 50.0000 mg | ORAL_TABLET | Freq: Four times a day (QID) | ORAL | Status: DC | PRN

## 2012-08-03 NOTE — ED Notes (Signed)
Pt returned from xray; no signs of distress.  
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
## 2012-08-03 NOTE — ED Notes (Signed)
PT ambulated with baseline gait; VSS; A&Ox3; no signs of distress; respirations even and unlabored; skin warm and dry; no questions upon discharge.  
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
## 2012-08-03 NOTE — ED Notes (Signed)
Patient transported to X-ray 

## 2012-08-05 NOTE — ED Provider Notes (Signed)
Medical screening examination/treatment/procedure(s) were performed by non-physician practitioner and as supervising physician I was immediately available for consultation/collaboration.  Jadah Bobak, MD 08/05/12 1703 

## 2012-08-10 DIAGNOSIS — M25519 Pain in unspecified shoulder: Secondary | ICD-10-CM | POA: Diagnosis not present

## 2012-08-15 DIAGNOSIS — M19019 Primary osteoarthritis, unspecified shoulder: Secondary | ICD-10-CM | POA: Diagnosis not present

## 2012-09-04 ENCOUNTER — Encounter: Payer: Self-pay | Admitting: Cardiovascular Disease

## 2012-09-04 ENCOUNTER — Ambulatory Visit (INDEPENDENT_AMBULATORY_CARE_PROVIDER_SITE_OTHER): Payer: BLUE CROSS/BLUE SHIELD | Admitting: Cardiovascular Disease

## 2012-09-04 VITALS — BP 138/80 | HR 60 | Ht 68.0 in | Wt 213.6 lb

## 2012-09-04 DIAGNOSIS — E119 Type 2 diabetes mellitus without complications: Secondary | ICD-10-CM

## 2012-09-04 DIAGNOSIS — G4733 Obstructive sleep apnea (adult) (pediatric): Secondary | ICD-10-CM

## 2012-09-04 DIAGNOSIS — I10 Essential (primary) hypertension: Secondary | ICD-10-CM

## 2012-09-04 DIAGNOSIS — I2510 Atherosclerotic heart disease of native coronary artery without angina pectoris: Secondary | ICD-10-CM

## 2012-09-04 NOTE — Progress Notes (Signed)
Patient ID: Seaborn Nakama, male   DOB: 04/20/1945, 67 y.o.   MRN: [id number]   HPI: Tarence Searcy, is a 67 y.o. male who presents to the office today for sleep clinic evaluation following initiation of CPAP therapy.  Mr. Zebadiah Willert originally from [place] to help. He tells me in the 1990s while in [place] he was diagnosed with sleep apnea. He was given a CPAP unit. However, he never used CPAP. He does have a history of hypertension, hyperlipidemia, diabetes mellitus, GERD, and remote tobacco use. He smoked for approximately 45 years but quit smoking in March 2000 and February he was referred for a split-night sleep study. This confirms severe sleep apnea. At that time he has significant daytime sleepiness with an Epworth scale of 15. AHI was 69.3 per hour and REM sleep was increased at 71.5 events per hour. He dropped his O2 saturation to 81% with REM sleep and had loud snoring. He underwent a split-night evaluation. He also was prescribed an 18 cm fixed water pressure. Since initiating CPAP therapy, he does feel he is sleeping better he typically goes to have possibly 9:30 at night and wakes up around 5 to 5:30 in the morning. He still wakes up approximately 2 times per night to go to the bathroom. Not always has he put his mask back on the was obtained from 06/21/2012 through June 2014. He had 77% of the days with usage. He admits that he did not use it when he went on vacation. His average usage on days used to 6 hours and 2 minutes. He had 70% of days greater than 4 hours. HI was excellent at 1.4 is 18 cm water pressure.  Epworth Sleepiness Scale: Situation   Chance of Dozing/Sleeping (0 = never , 1 = slight chance , 2 = moderate chance , 3 = high chance )   sitting and reading 1   watching TV 2   sitting inactive in a public place 2   being a passenger in a motor vehicle for an hour or more 0   lying down in the afternoon 3   sitting and talking to someone 0   sitting quietly  after lunch (no alcohol) 2   while stopped for a few minutes in traffic as the driver 0   Total Score  10    Past Medical History  Diagnosis Date  . Diabetes mellitus without complication   . Hypertension   . Hyperlipidemia   . Chest pain on exertion 03/17/2012  . HTN (hypertension) 03/17/2012  . DM (diabetes mellitus) 03/17/2012  . 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 OSA (obstructive sleep apnea), has been cpap intolerant 03/18/2012  . 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 CAD (coronary artery disease), with 60-70% stenosis in RCA 03/18/2012  . Pulmonary hypertension 12/10/2010    ECHO:  Mild PH,mild LVH  . Wenckebach 04/09/2012-05/09/2012    Event monitor    Past Surgical History  Procedure Laterality Date  . Shoulder arthroscopy  2006  . Appendectomy  1974  . Carpal tunnel release    . Hemorrhoid surgery    . Eye surgery  1960    Left eye  . Cardiac catheterization  03/27/2010    Moderate mid RCA lesion,right radial approach,normal EF    No Known Allergies  Current Outpatient Prescriptions  Medication Sig Dispense Refill  . aspirin 325 MG tablet Take 325 mg  by mouth daily.      . Cholecalciferol (VITAMIN D-3) 1000 UNITS CAPS Take by mouth daily. 2 capsules.      . fish oil-omega-3 fatty acids 1000 MG capsule Take 1 g by mouth daily.      Marland Kitchen glipiZIDE (GLUCOTROL) 10 MG tablet Take 10 mg by mouth 2 (two) times daily before a meal.      . Multiple Vitamin (MULTIVITAMIN WITH MINERALS) TABS Take 1 tablet by mouth daily.      . nitroGLYCERIN (NITROSTAT) 0.3 MG SL tablet Place 1 tablet (0.3 mg total) under the tongue every 5 (five) minutes as needed for chest pain.  25 tablet  4  . pioglitazone (ACTOS) 45 MG tablet Take 45 mg by mouth daily.      . rosuvastatin (CRESTOR) 40 MG tablet Take 40 mg by mouth daily.      . valsartan-hydrochlorothiazide (DIOVAN-HCT) 320-25 MG per tablet Take 1 tablet by mouth daily.      Marland Kitchen  VICODIN ES 7.5-300 MG TABS        No current facility-administered medications for this visit.    Socially he is has 2 children 3 grandchildren. It smoked for over 40 years and quit smoking in March 2014. Does drink occasional alcohol. He does exercise. He is originally from [facility] [place]. He is looking [place] area for 4 years  ROS negative for fever chills night sweats. He did have a history of significant snoring. He denies breakthrough snoring on CPAP. He denies palpitations. He does wake up 2-3 times per night to go to the bathroom. He denies chest pain. He denies orthopnea. He denies leg swelling. He denies restless legs. He denies bruxism the other system review is negative.  PE BP 138/80  Pulse 60  Ht 5\' 8"  (1.727 m)  Wt 213 lb 9.6 oz (96.888 kg)  BMI 32.49 kg/m2  General: Alert, oriented, no distress.  Skin: normal turgor, no rashes HEENT: Normocephalic, atraumatic. Pupils round and reactive; sclera anicteric; suggestion of small bilateral cataracts Fundi mild arteriolar narrowing Nose without nasal septal hypertrophy Mouth/Parynx benign; Mallinpatti scale 3/4 Neck: No JVD, no carotid briuts Lungs: clear to ausculatation and percussion; no wheezing or rales Heart: RRR, s1 s2 normal 1/6 sem Abdomen: soft, nontender; no hepatosplenomehaly, BS+; abdominal aorta nontender and not dilated by palpation. Pulses 2+ Extremities: no clubbinbg cyanosis or edema, Homan's sign negative  Neurologic: grossly nonfocal   LABS:  BMET    Component Value Date/Time   NA 142 03/17/2012 1152   K 3.7 03/17/2012 1152   CL 103 03/17/2012 1152   CO2 29 03/17/2012 1152   GLUCOSE 93 03/17/2012 1152   BUN 22 03/17/2012 1152   CREATININE 1.30 03/17/2012 1152   CALCIUM 10.1 03/17/2012 1152   GFRNONAA 55* 03/17/2012 1152   GFRAA 64* 03/17/2012 1152     Hepatic Function Panel  No results found for this basename: prot, albumin, ast, alt, alkphos, bilitot, bilidir, ibili     CBC    Component Value  Date/Time   WBC 8.8 03/17/2012 1152   RBC 5.75 03/17/2012 1152   HGB 16.9 03/17/2012 1152   HCT 47.6 03/17/2012 1152   PLT 241 03/17/2012 1152   MCV 82.8 03/17/2012 1152   MCH 29.4 03/17/2012 1152   MCHC 35.5 03/17/2012 1152   RDW 14.1 03/17/2012 1152   LYMPHSABS 2.0 03/17/2012 1152   MONOABS 0.4 03/17/2012 1152   EOSABS 0.1 03/17/2012 1152   BASOSABS 0.0 03/17/2012 1152  BNP No results found for this basename: probnp    Lipid Panel     Component Value Date/Time   CHOL 182 03/18/2012 0320   TRIG 193* 03/18/2012 0320   HDL 53 03/18/2012 0320   CHOLHDL 3.4 03/18/2012 0320   VLDL 39 03/18/2012 0320   LDLCALC 90 03/18/2012 0320     RADIOLOGY: No results found.    ASSESSMENT AND PLAN: My impression is that Mr. Varma is a 67 year old am who has a history of obstructive sleep apnea dating back over 20 years. Really utilize CPAP therapy when he was initially diagnosed recently was found to have severe sleep apnea. He dropped his O2 saturation to 81% on his initial study in an AHI of 69.3 per hour he now has been utilizing CPAP therapy with marked improvement. His sleep pattern has improved. The days that he has been using his device he does note less residual daytime sleepiness. His Epworth scale has improved to 10 but I suspect this is still elevated because of the combination of reduced sleep duration couple of days if he is not using it. I suggested that he sleep on average 8 hours per night . I had a long discussion with him concerning normal sleep architecture and the importance of using his CPAP 100% of the nights for all night long. We discussed patterns of REM sleep occurs in the latter phase of night and therefore if he does wake up to go to the bathroom he needs to keep his mask on. He is using a nasal barrage facemask applied also adjust his medication today from 1-3 per point of additional weight loss. I answered all his questions. I will see him in one year for followup evaluation.     Lennette Bihari, MD, [facility]  09/04/2012 10:00 AM

## 2012-09-04 NOTE — Patient Instructions (Addendum)
Your physician recommends that you schedule a follow-up appointment in 1 YEAR.  

## 2012-09-06 DIAGNOSIS — M19019 Primary osteoarthritis, unspecified shoulder: Secondary | ICD-10-CM | POA: Diagnosis not present

## 2012-09-11 DIAGNOSIS — M25519 Pain in unspecified shoulder: Secondary | ICD-10-CM | POA: Diagnosis not present

## 2012-09-13 DIAGNOSIS — D500 Iron deficiency anemia secondary to blood loss (chronic): Secondary | ICD-10-CM | POA: Diagnosis not present

## 2012-09-14 DIAGNOSIS — M25519 Pain in unspecified shoulder: Secondary | ICD-10-CM | POA: Diagnosis not present

## 2012-09-19 DIAGNOSIS — M25519 Pain in unspecified shoulder: Secondary | ICD-10-CM | POA: Diagnosis not present

## 2012-09-21 DIAGNOSIS — M25519 Pain in unspecified shoulder: Secondary | ICD-10-CM | POA: Diagnosis not present

## 2012-09-25 DIAGNOSIS — M25519 Pain in unspecified shoulder: Secondary | ICD-10-CM | POA: Diagnosis not present

## 2012-09-27 DIAGNOSIS — M25519 Pain in unspecified shoulder: Secondary | ICD-10-CM | POA: Diagnosis not present

## 2012-09-29 DIAGNOSIS — N189 Chronic kidney disease, unspecified: Secondary | ICD-10-CM | POA: Diagnosis not present

## 2012-09-29 DIAGNOSIS — N289 Disorder of kidney and ureter, unspecified: Secondary | ICD-10-CM | POA: Insufficient documentation

## 2012-09-29 DIAGNOSIS — E291 Testicular hypofunction: Secondary | ICD-10-CM | POA: Diagnosis not present

## 2012-09-29 DIAGNOSIS — I10 Essential (primary) hypertension: Secondary | ICD-10-CM | POA: Diagnosis not present

## 2012-09-29 DIAGNOSIS — Z79899 Other long term (current) drug therapy: Secondary | ICD-10-CM | POA: Diagnosis not present

## 2012-09-29 DIAGNOSIS — E785 Hyperlipidemia, unspecified: Secondary | ICD-10-CM | POA: Diagnosis not present

## 2012-09-29 DIAGNOSIS — D649 Anemia, unspecified: Secondary | ICD-10-CM | POA: Diagnosis not present

## 2012-09-29 DIAGNOSIS — Z125 Encounter for screening for malignant neoplasm of prostate: Secondary | ICD-10-CM | POA: Diagnosis not present

## 2012-09-29 DIAGNOSIS — D509 Iron deficiency anemia, unspecified: Secondary | ICD-10-CM | POA: Insufficient documentation

## 2012-09-29 DIAGNOSIS — E119 Type 2 diabetes mellitus without complications: Secondary | ICD-10-CM | POA: Diagnosis not present

## 2012-10-02 ENCOUNTER — Encounter: Payer: Self-pay | Admitting: Cardiology

## 2012-10-02 ENCOUNTER — Ambulatory Visit (INDEPENDENT_AMBULATORY_CARE_PROVIDER_SITE_OTHER): Payer: BLUE CROSS/BLUE SHIELD | Admitting: Cardiology

## 2012-10-02 VITALS — BP 140/68 | Ht 68.0 in | Wt 214.5 lb

## 2012-10-02 DIAGNOSIS — E785 Hyperlipidemia, unspecified: Secondary | ICD-10-CM

## 2012-10-02 DIAGNOSIS — E669 Obesity, unspecified: Secondary | ICD-10-CM

## 2012-10-02 DIAGNOSIS — G4733 Obstructive sleep apnea (adult) (pediatric): Secondary | ICD-10-CM

## 2012-10-02 DIAGNOSIS — E66811 Obesity, class 1: Secondary | ICD-10-CM

## 2012-10-02 DIAGNOSIS — E663 Overweight: Secondary | ICD-10-CM | POA: Insufficient documentation

## 2012-10-02 DIAGNOSIS — I10 Essential (primary) hypertension: Secondary | ICD-10-CM

## 2012-10-02 DIAGNOSIS — Z87891 Personal history of nicotine dependence: Secondary | ICD-10-CM

## 2012-10-02 DIAGNOSIS — I441 Atrioventricular block, second degree: Secondary | ICD-10-CM

## 2012-10-02 DIAGNOSIS — I4510 Unspecified right bundle-branch block: Secondary | ICD-10-CM

## 2012-10-02 DIAGNOSIS — I2510 Atherosclerotic heart disease of native coronary artery without angina pectoris: Secondary | ICD-10-CM

## 2012-10-02 HISTORY — DX: Obesity, class 1: E66.811

## 2012-10-02 HISTORY — DX: Obesity, unspecified: E66.9

## 2012-10-02 NOTE — Patient Instructions (Addendum)
Stop Aspirin 325 mg x 5 days -- then restart at 81 mg.  BP is a bit up, but OK.  Congratulations on stopping smoking.  I think this explains your weight gain.  -- the weight will come back off once you get back into your diet routine you were on before.  Provided you do not have any more chest discomfort or passing out episodes, etc.  We can see you back in 1 yr.  Marykay Lex, MD

## 2012-10-03 DIAGNOSIS — M25519 Pain in unspecified shoulder: Secondary | ICD-10-CM | POA: Diagnosis not present

## 2012-10-09 DIAGNOSIS — M25519 Pain in unspecified shoulder: Secondary | ICD-10-CM | POA: Diagnosis not present

## 2012-10-10 DIAGNOSIS — M25519 Pain in unspecified shoulder: Secondary | ICD-10-CM | POA: Diagnosis not present

## 2012-10-15 ENCOUNTER — Encounter: Payer: Self-pay | Admitting: Cardiology

## 2012-10-15 DIAGNOSIS — E785 Hyperlipidemia, unspecified: Secondary | ICD-10-CM | POA: Insufficient documentation

## 2012-10-15 NOTE — Progress Notes (Signed)
Patient ID: James Jones, male   DOB: Oct 07, 1945, 67 y.o.   MRN: [id number] PCP: Herb Grays, MD  Clinic Note: Chief Complaint  Patient presents with  . 8 month visit    no chest pain ,no sob, no edema    HPI: James Jones is a 67 y.o. male with a PMH below who presents today for a routine followup. I last saw him in January. Since then he was admitted to the hospital with signs and symptoms that were concerning for unstable angina. He underwent cardiac catheterization that demonstrated a focal 60-70% lesion the RCA. This is evaluated with the FFR which was found to be non-physiologically significant with a ratio of 0.84. He was discharged to be treated medically. He has quit smoking as he had said he was doing this past spring during the Easter season. Unfortunately with his mobilization, he is picked up his eating and never gained a lot of weight he had previously lost back.  Interval History: Since his hospitalization and his last followup appointment, he's been on fairly well with no recurrent symptoms of chest pain. He has been to see Dr. Tresa Endo to look into his CPAP, but I don't think it yet fully adjusted, but does seem to be using it.Marland Kitchen  He denied any chest pain or dyspnea with rest or exertion. He still walks 4 miles every other day. He does feel a little bit more fatigued of late, and was recently diagnosed with possible anemia is going to have a GI evaluation with EGD and colonoscopy soon. He denies any melena or hematochezia, or hematuria.  The remainder of Cardiovascular ROS: negative for - edema, irregular heartbeat, loss of consciousness, murmur, orthopnea, palpitations, paroxysmal nocturnal dyspnea, rapid heart rate or shortness of breath is as follows: Additional cardiac review of systems: Lightheadedness - no, dizziness - no, syncope/near-syncope - no; TIA/amaurosis fugax - no Melena - no, hematochezia no; hematuria - no; nosebleeds - no; claudication - no  Past Medical  History  Diagnosis Date  . Diabetes mellitus without complication   . Hypertension   . Hyperlipidemia   . 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 OSA (obstructive sleep apnea) 03/18/2012    has been cpap intolerant  . 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 Pulmonary hypertension 12/10/2010    ECHO:  Mild PH,mild LVH; PA pressures estimated 30-40 mmHg  . 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 Shoulder arthroscopy  2006  . Appendectomy  1974  . Carpal tunnel release    . Hemorrhoid surgery    . Eye surgery  1960    Left eye  . Cardiac catheterization  03/27/2010    Moderate mid RCA lesion,right radial approach,normal EF   No Known Allergies  Current Outpatient Prescriptions  Medication Sig Dispense Refill  . aspirin 325 MG tablet Take 325 mg by mouth daily.      . Cholecalciferol (VITAMIN D-3) 1000 UNITS CAPS Take by mouth daily. 2 capsules.      . fish oil-omega-3 fatty acids 1000 MG capsule Take 1 g by mouth daily.      Marland Kitchen glipiZIDE (GLUCOTROL) 10 MG tablet Take 10 mg  by mouth 2 (two) times daily before a meal.      . Multiple Vitamin (MULTIVITAMIN WITH MINERALS) TABS Take 1 tablet by mouth daily.      . pioglitazone (ACTOS) 45 MG tablet Take 45 mg by mouth daily.      Marland Kitchen PRESCRIPTION MEDICATION Uses C-PAP at bedtime      . rosuvastatin (CRESTOR) 40 MG tablet Take 40 mg by mouth daily.      . valsartan-hydrochlorothiazide (DIOVAN-HCT) 320-25 MG per tablet Take 1 tablet by mouth daily.      . nitroGLYCERIN (NITROSTAT) 0.3 MG SL tablet Place 1 tablet (0.3 mg total) under the tongue every 5 (five) minutes as needed for chest pain.  25 tablet  4  .  VICODIN ES 7.5-300 MG TABS        No current facility-administered medications for this visit.    History   Social History Narrative   Married, father of 2, grandfather of 3.   He is a former smoker of about a pack to pack and half cigarettes a day -- he quit in March of this year.    He is an avid exerciser working at least 4-5 days a week doing her walking or stationary bike.    ROS: A comprehensive Review of Systems - Negative except 51. He still has the Cornell Barman is noted during sleeping, and is yet to get fully onto his CPAP as noted.  PHYSICAL EXAM BP 140/68  Ht 5\' 8"  (1.727 m)  Wt 214 lb 8 oz (97.297 kg)  BMI 32.62 kg/m2 General appearance: alert, cooperative, appears stated age, no distress, mildly obese and Otherwise healthy-appearing. Well-nourished and well-groomed. Answers questions appropriately. Neck: no adenopathy, no carotid bruit, no JVD, supple, symmetrical, trachea midline and thyroid not enlarged, symmetric, no tenderness/mass/nodules Lungs: clear to auscultation bilaterally, normal percussion bilaterally and Nonlabored, and good air movement. 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 With moderate, nonobstructive disease the mainstay of treatment is risk factor modification. He quit smoking which is that one. He is on a statin plus fish oil for his lipid control which was well controlled on this last check. A little bit  concerned about his weight gain taking away some of the controlled we have, but that's not expected with his smoking cessation.  He remains active and exercising with no anginal symptoms. We'll continue to monitor.  Plan: Reduce aspirin to 81 mg daily especially in light of his upcoming GI evaluation.  HTN (hypertension) His blood pressure today is above his goal was a diabetic. He is on max dose of valsartan HCTZ and I think the beta blocker because of his wenkebach blocks. We'll need to monitor his pressures and if indicated normal blood pressure control is required, I would use a dihydropyridine calcium channel blocker such as amlodipine.  OSA (obstructive sleep apnea), has been cpap intolerant We inserted this is a actively being adjusted by Dr. Tresa Endo. This is the mainstay for his risk factor modification.  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 I think he may partly be related to his OSA. It may not be as prominent when on CPAP. However we'll continue to hold AV nodal agents.  Obesity (BMI 30-39.9); 32.6 on 10/02/2012 Hopefully once he is consolidating his smoking cessation, he'll then be able to cut back on some of what he is eating. He is getting any exercise for his burning calories coming diseased and not taking with me. We discussed the importance of dietary adjustments.  RBBB, NEW Not really sure what to make of this new finding. He is not having any significant symptoms besides mild fatigue. She is not having any anginal symptoms. Right bundle branch block is somewhat nonspecific. I think it would routinely check a ECG may possibly be related. Continue to monitor. Right bundle branch block is not the same as Left Bundle Branch Block.    Orders Placed This Encounter  Procedures  . EKG 12-Lead   Meds ordered this encounter  Medications  . PRESCRIPTION MEDICATION    Sig: Uses C-PAP at bedtime    Followup:  One year  DAVID W. Herbie Baltimore, M.D., M.S. [facility] [address] [place], [place]  [id number]  [phone] Pager # [phone]

## 2012-10-15 NOTE — Assessment & Plan Note (Addendum)
No active anginal symptoms. With moderate, nonobstructive disease the mainstay of treatment is risk factor modification. He quit smoking which is that one. He is on a statin plus fish oil for his lipid control which was well controlled on this last check. A little bit concerned about his weight gain taking away some of the controlled we have, but that's not expected with his smoking cessation.  He remains active and exercising with no anginal symptoms. We'll continue to monitor.  Plan: Reduce aspirin to 81 mg daily especially in light of his upcoming GI evaluation.

## 2012-10-15 NOTE — Assessment & Plan Note (Signed)
I congratulated him significantly on this is complicated. This is was goal is that he gets. I even gave information to night school himself for the weight gain since stopping. I called him that this is probably the best step that he can take in preventing any progression of his CAD.

## 2012-10-15 NOTE — Assessment & Plan Note (Signed)
Hopefully once he is consolidating his smoking cessation, he'll then be able to cut back on some of what he is eating. He is getting any exercise for his burning calories coming diseased and not taking with me. We discussed the importance of dietary adjustments.

## 2012-10-15 NOTE — Assessment & Plan Note (Addendum)
Not really sure what to make of this new finding. He is not having any significant symptoms besides mild fatigue. She is not having any anginal symptoms. Right bundle branch block is somewhat nonspecific. I think it would routinely check a ECG may possibly be related. Continue to monitor. Right bundle branch block is not the same as Left Bundle Branch Block.

## 2012-10-15 NOTE — Assessment & Plan Note (Signed)
His blood pressure today is above his goal was a diabetic. He is on max dose of valsartan HCTZ and I think the beta blocker because of his wenkebach blocks. We'll need to monitor his pressures and if indicated normal blood pressure control is required, I would use a dihydropyridine calcium channel blocker such as amlodipine.

## 2012-10-15 NOTE — Assessment & Plan Note (Signed)
He is not on beta blocker because of this. I think he may partly be related to his OSA. It may not be as prominent when on CPAP. However we'll continue to hold AV nodal agents.

## 2012-10-15 NOTE — Assessment & Plan Note (Signed)
We inserted this is a actively being adjusted by Dr. Tresa Endo. This is the mainstay for his risk factor modification.

## 2012-10-17 DIAGNOSIS — M25519 Pain in unspecified shoulder: Secondary | ICD-10-CM | POA: Diagnosis not present

## 2012-10-18 DIAGNOSIS — S43429A Sprain of unspecified rotator cuff capsule, initial encounter: Secondary | ICD-10-CM | POA: Diagnosis not present

## 2012-10-18 DIAGNOSIS — M19019 Primary osteoarthritis, unspecified shoulder: Secondary | ICD-10-CM | POA: Diagnosis not present

## 2012-10-18 DIAGNOSIS — S43499A Other sprain of unspecified shoulder joint, initial encounter: Secondary | ICD-10-CM | POA: Diagnosis not present

## 2012-10-26 DIAGNOSIS — D509 Iron deficiency anemia, unspecified: Secondary | ICD-10-CM | POA: Diagnosis not present

## 2012-10-26 DIAGNOSIS — K449 Diaphragmatic hernia without obstruction or gangrene: Secondary | ICD-10-CM | POA: Diagnosis not present

## 2012-10-26 DIAGNOSIS — K5730 Diverticulosis of large intestine without perforation or abscess without bleeding: Secondary | ICD-10-CM | POA: Diagnosis not present

## 2012-10-26 DIAGNOSIS — R195 Other fecal abnormalities: Secondary | ICD-10-CM | POA: Diagnosis not present

## 2012-10-31 DIAGNOSIS — S43499A Other sprain of unspecified shoulder joint, initial encounter: Secondary | ICD-10-CM | POA: Diagnosis not present

## 2012-10-31 DIAGNOSIS — G8918 Other acute postprocedural pain: Secondary | ICD-10-CM | POA: Diagnosis not present

## 2012-10-31 DIAGNOSIS — M7520 Bicipital tendinitis, unspecified shoulder: Secondary | ICD-10-CM | POA: Diagnosis not present

## 2012-10-31 DIAGNOSIS — M24119 Other articular cartilage disorders, unspecified shoulder: Secondary | ICD-10-CM | POA: Diagnosis not present

## 2012-10-31 DIAGNOSIS — M67919 Unspecified disorder of synovium and tendon, unspecified shoulder: Secondary | ICD-10-CM | POA: Diagnosis not present

## 2012-10-31 DIAGNOSIS — M19019 Primary osteoarthritis, unspecified shoulder: Secondary | ICD-10-CM | POA: Diagnosis not present

## 2012-11-07 DIAGNOSIS — M25519 Pain in unspecified shoulder: Secondary | ICD-10-CM | POA: Diagnosis not present

## 2012-11-10 DIAGNOSIS — M25519 Pain in unspecified shoulder: Secondary | ICD-10-CM | POA: Diagnosis not present

## 2012-11-14 DIAGNOSIS — K921 Melena: Secondary | ICD-10-CM | POA: Diagnosis not present

## 2012-11-14 DIAGNOSIS — M25519 Pain in unspecified shoulder: Secondary | ICD-10-CM | POA: Diagnosis not present

## 2012-11-16 DIAGNOSIS — M25519 Pain in unspecified shoulder: Secondary | ICD-10-CM | POA: Diagnosis not present

## 2012-11-20 DIAGNOSIS — M25519 Pain in unspecified shoulder: Secondary | ICD-10-CM | POA: Diagnosis not present

## 2012-11-23 DIAGNOSIS — M25519 Pain in unspecified shoulder: Secondary | ICD-10-CM | POA: Diagnosis not present

## 2012-11-27 DIAGNOSIS — M25519 Pain in unspecified shoulder: Secondary | ICD-10-CM | POA: Diagnosis not present

## 2012-11-28 DIAGNOSIS — R109 Unspecified abdominal pain: Secondary | ICD-10-CM | POA: Diagnosis not present

## 2012-11-28 DIAGNOSIS — D649 Anemia, unspecified: Secondary | ICD-10-CM | POA: Diagnosis not present

## 2012-11-30 DIAGNOSIS — M25519 Pain in unspecified shoulder: Secondary | ICD-10-CM | POA: Diagnosis not present

## 2012-12-04 DIAGNOSIS — M25519 Pain in unspecified shoulder: Secondary | ICD-10-CM | POA: Diagnosis not present

## 2012-12-07 DIAGNOSIS — M25519 Pain in unspecified shoulder: Secondary | ICD-10-CM | POA: Diagnosis not present

## 2012-12-11 DIAGNOSIS — M25519 Pain in unspecified shoulder: Secondary | ICD-10-CM | POA: Diagnosis not present

## 2012-12-13 DIAGNOSIS — M25519 Pain in unspecified shoulder: Secondary | ICD-10-CM | POA: Diagnosis not present

## 2012-12-18 DIAGNOSIS — M25519 Pain in unspecified shoulder: Secondary | ICD-10-CM | POA: Diagnosis not present

## 2012-12-20 DIAGNOSIS — M25519 Pain in unspecified shoulder: Secondary | ICD-10-CM | POA: Diagnosis not present

## 2012-12-25 DIAGNOSIS — M25519 Pain in unspecified shoulder: Secondary | ICD-10-CM | POA: Diagnosis not present

## 2012-12-27 DIAGNOSIS — E161 Other hypoglycemia: Secondary | ICD-10-CM | POA: Diagnosis not present

## 2012-12-27 DIAGNOSIS — N189 Chronic kidney disease, unspecified: Secondary | ICD-10-CM | POA: Diagnosis not present

## 2012-12-27 DIAGNOSIS — E291 Testicular hypofunction: Secondary | ICD-10-CM | POA: Diagnosis not present

## 2012-12-27 DIAGNOSIS — M25519 Pain in unspecified shoulder: Secondary | ICD-10-CM | POA: Diagnosis not present

## 2012-12-27 DIAGNOSIS — E782 Mixed hyperlipidemia: Secondary | ICD-10-CM | POA: Diagnosis not present

## 2013-01-01 DIAGNOSIS — D509 Iron deficiency anemia, unspecified: Secondary | ICD-10-CM | POA: Diagnosis not present

## 2013-01-01 DIAGNOSIS — E785 Hyperlipidemia, unspecified: Secondary | ICD-10-CM | POA: Diagnosis not present

## 2013-01-01 DIAGNOSIS — I10 Essential (primary) hypertension: Secondary | ICD-10-CM | POA: Diagnosis not present

## 2013-01-01 DIAGNOSIS — E119 Type 2 diabetes mellitus without complications: Secondary | ICD-10-CM | POA: Insufficient documentation

## 2013-01-01 DIAGNOSIS — D649 Anemia, unspecified: Secondary | ICD-10-CM | POA: Diagnosis not present

## 2013-01-02 DIAGNOSIS — E349 Endocrine disorder, unspecified: Secondary | ICD-10-CM | POA: Insufficient documentation

## 2013-01-02 DIAGNOSIS — M25519 Pain in unspecified shoulder: Secondary | ICD-10-CM | POA: Diagnosis not present

## 2013-01-09 DIAGNOSIS — M25519 Pain in unspecified shoulder: Secondary | ICD-10-CM | POA: Diagnosis not present

## 2013-01-09 DIAGNOSIS — D649 Anemia, unspecified: Secondary | ICD-10-CM | POA: Diagnosis not present

## 2013-01-11 DIAGNOSIS — M25519 Pain in unspecified shoulder: Secondary | ICD-10-CM | POA: Diagnosis not present

## 2013-01-16 DIAGNOSIS — M25519 Pain in unspecified shoulder: Secondary | ICD-10-CM | POA: Diagnosis not present

## 2013-01-18 DIAGNOSIS — M25519 Pain in unspecified shoulder: Secondary | ICD-10-CM | POA: Diagnosis not present

## 2013-01-23 DIAGNOSIS — M25519 Pain in unspecified shoulder: Secondary | ICD-10-CM | POA: Diagnosis not present

## 2013-01-26 DIAGNOSIS — M25519 Pain in unspecified shoulder: Secondary | ICD-10-CM | POA: Diagnosis not present

## 2013-01-30 DIAGNOSIS — M25519 Pain in unspecified shoulder: Secondary | ICD-10-CM | POA: Diagnosis not present

## 2013-02-08 DIAGNOSIS — Z8719 Personal history of other diseases of the digestive system: Secondary | ICD-10-CM

## 2013-02-08 HISTORY — DX: Personal history of other diseases of the digestive system: Z87.19

## 2013-02-12 DIAGNOSIS — D649 Anemia, unspecified: Secondary | ICD-10-CM | POA: Diagnosis not present

## 2013-02-13 ENCOUNTER — Inpatient Hospital Stay (HOSPITAL_COMMUNITY)
Admission: EM | Admit: 2013-02-13 | Discharge: 2013-02-14 | DRG: 812 | Disposition: A | Discharge status: Home / Self Care | Payer: Medicare Other | Attending: Internal Medicine | Admitting: Internal Medicine

## 2013-02-13 ENCOUNTER — Encounter (HOSPITAL_COMMUNITY): Payer: Self-pay | Admitting: Emergency Medicine

## 2013-02-13 DIAGNOSIS — K219 Gastro-esophageal reflux disease without esophagitis: Secondary | ICD-10-CM | POA: Diagnosis present

## 2013-02-13 DIAGNOSIS — R5383 Other fatigue: Secondary | ICD-10-CM | POA: Diagnosis not present

## 2013-02-13 DIAGNOSIS — G4733 Obstructive sleep apnea (adult) (pediatric): Secondary | ICD-10-CM | POA: Diagnosis present

## 2013-02-13 DIAGNOSIS — Z8249 Family history of ischemic heart disease and other diseases of the circulatory system: Secondary | ICD-10-CM

## 2013-02-13 DIAGNOSIS — K922 Gastrointestinal hemorrhage, unspecified: Secondary | ICD-10-CM

## 2013-02-13 DIAGNOSIS — D649 Anemia, unspecified: Secondary | ICD-10-CM

## 2013-02-13 DIAGNOSIS — E785 Hyperlipidemia, unspecified: Secondary | ICD-10-CM | POA: Diagnosis not present

## 2013-02-13 DIAGNOSIS — I2510 Atherosclerotic heart disease of native coronary artery without angina pectoris: Secondary | ICD-10-CM | POA: Diagnosis present

## 2013-02-13 DIAGNOSIS — E669 Obesity, unspecified: Secondary | ICD-10-CM | POA: Diagnosis present

## 2013-02-13 DIAGNOSIS — I4510 Unspecified right bundle-branch block: Secondary | ICD-10-CM

## 2013-02-13 DIAGNOSIS — I129 Hypertensive chronic kidney disease with stage 1 through stage 4 chronic kidney disease, or unspecified chronic kidney disease: Secondary | ICD-10-CM | POA: Diagnosis present

## 2013-02-13 DIAGNOSIS — E663 Overweight: Secondary | ICD-10-CM | POA: Diagnosis present

## 2013-02-13 DIAGNOSIS — D62 Acute posthemorrhagic anemia: Secondary | ICD-10-CM

## 2013-02-13 DIAGNOSIS — Z9089 Acquired absence of other organs: Secondary | ICD-10-CM

## 2013-02-13 DIAGNOSIS — E119 Type 2 diabetes mellitus without complications: Secondary | ICD-10-CM | POA: Diagnosis present

## 2013-02-13 DIAGNOSIS — I2789 Other specified pulmonary heart diseases: Secondary | ICD-10-CM | POA: Diagnosis present

## 2013-02-13 DIAGNOSIS — R5381 Other malaise: Secondary | ICD-10-CM | POA: Diagnosis not present

## 2013-02-13 DIAGNOSIS — F1210 Cannabis abuse, uncomplicated: Secondary | ICD-10-CM | POA: Diagnosis present

## 2013-02-13 DIAGNOSIS — N182 Chronic kidney disease, stage 2 (mild): Secondary | ICD-10-CM | POA: Diagnosis present

## 2013-02-13 DIAGNOSIS — Z841 Family history of disorders of kidney and ureter: Secondary | ICD-10-CM

## 2013-02-13 DIAGNOSIS — Z87891 Personal history of nicotine dependence: Secondary | ICD-10-CM

## 2013-02-13 DIAGNOSIS — I10 Essential (primary) hypertension: Secondary | ICD-10-CM

## 2013-02-13 DIAGNOSIS — I441 Atrioventricular block, second degree: Secondary | ICD-10-CM

## 2013-02-13 LAB — COMPREHENSIVE METABOLIC PANEL
ALT: 13 U/L (ref 0–53)
AST: 18 U/L (ref 0–37)
Albumin: 3.5 g/dL (ref 3.5–5.2)
Alkaline Phosphatase: 65 U/L (ref 39–117)
BUN: 18 mg/dL (ref 6–23)
CO2: 25 meq/L (ref 19–32)
CREATININE: 1.32 mg/dL (ref 0.50–1.35)
Calcium: 8.9 mg/dL (ref 8.4–10.5)
Chloride: 107 mEq/L (ref 96–112)
GFR, EST AFRICAN AMERICAN: 62 mL/min — AB (ref >90)
GFR, EST NON AFRICAN AMERICAN: 53 mL/min — AB (ref >90)
GLUCOSE: 65 mg/dL — AB (ref 70–99)
Potassium: 4.1 mEq/L (ref 3.7–5.3)
Sodium: 144 mEq/L (ref 137–147)
TOTAL PROTEIN: 6.6 g/dL (ref 6.0–8.3)
Total Bilirubin: <0.2 mg/dL — ABNORMAL LOW (ref 0.3–1.2)

## 2013-02-13 LAB — CBC WITH DIFFERENTIAL/PLATELET
Basophils Absolute: 0 10*3/uL (ref 0.0–0.1)
Basophils Relative: 0 % (ref 0–1)
Eosinophils Absolute: 0.1 10*3/uL (ref 0.0–0.7)
Eosinophils Relative: 1 % (ref 0–5)
LYMPHS ABS: 1.5 10*3/uL (ref 0.7–4.0)
Lymphocytes Relative: 29 % (ref 12–46)
MONOS PCT: 7 % (ref 3–12)
Monocytes Absolute: 0.4 10*3/uL (ref 0.1–1.0)
Neutro Abs: 3.4 10*3/uL (ref 1.7–7.7)
Neutrophils Relative %: 64 % (ref 43–77)

## 2013-02-13 LAB — URINALYSIS, ROUTINE W REFLEX MICROSCOPIC
Bilirubin Urine: NEGATIVE
Glucose, UA: NEGATIVE mg/dL
Hgb urine dipstick: NEGATIVE
Ketones, ur: NEGATIVE mg/dL
Leukocytes, UA: NEGATIVE
NITRITE: NEGATIVE
PH: 5.5 (ref 5.0–8.0)
Protein, ur: NEGATIVE mg/dL
SPECIFIC GRAVITY, URINE: 1.01 (ref 1.005–1.030)
Urobilinogen, UA: 0.2 mg/dL (ref 0.0–1.0)

## 2013-02-13 LAB — CBC
HCT: 22.4 % — ABNORMAL LOW (ref 39.0–52.0)
HEMATOCRIT: 25.2 % — AB (ref 39.0–52.0)
Hemoglobin: 8 g/dL — ABNORMAL LOW (ref 13.0–17.0)
Hemoglobin: 7.3 g/dL — ABNORMAL LOW (ref 13.0–17.0)
MCH: 26.5 pg (ref 26.0–34.0)
MCH: 27 pg (ref 26.0–34.0)
MCHC: 31.7 g/dL (ref 30.0–36.0)
MCHC: 32.6 g/dL (ref 30.0–36.0)
MCV: 83.4 fL (ref 78.0–100.0)
MCV: 83 fL (ref 78.0–100.0)
PLATELETS: 328 10*3/uL (ref 150–400)
Platelets: 257 10*3/uL (ref 150–400)
RBC: 3.02 MIL/uL — ABNORMAL LOW (ref 4.22–5.81)
RBC: 2.7 MIL/uL — AB (ref 4.22–5.81)
RDW: 16.2 % — AB (ref 11.5–15.5)
RDW: 16.3 % — ABNORMAL HIGH (ref 11.5–15.5)
WBC: 6.1 10*3/uL (ref 4.0–10.5)
WBC: 5.1 10*3/uL (ref 4.0–10.5)

## 2013-02-13 LAB — PREPARE RBC (CROSSMATCH)

## 2013-02-13 LAB — PROTIME-INR
INR: 0.91 (ref 0.00–1.49)
Prothrombin Time: 12.1 seconds (ref 11.6–15.2)

## 2013-02-13 LAB — MAGNESIUM: Magnesium: 1.9 mg/dL (ref 1.5–2.5)

## 2013-02-13 LAB — APTT: APTT: 30 s (ref 24–37)

## 2013-02-13 LAB — ABO/RH: ABO/RH(D): A POS

## 2013-02-13 LAB — PHOSPHORUS: Phosphorus: 3.9 mg/dL (ref 2.3–4.6)

## 2013-02-13 MED ORDER — NITROGLYCERIN 0.4 MG SL SUBL: 0.4000 mg | SUBLINGUAL_TABLET | SUBLINGUAL | Status: DC | PRN

## 2013-02-13 MED ORDER — OMEGA-3-ACID ETHYL ESTERS 1 G PO CAPS
1.0000 g | ORAL_CAPSULE | Freq: Every day | ORAL | Status: DC
  Administered 2013-02-14: 1 g via ORAL
  Filled 2013-02-13: qty 1

## 2013-02-13 MED ORDER — HYDROCHLOROTHIAZIDE 25 MG PO TABS
25.0000 mg | ORAL_TABLET | Freq: Every day | ORAL | Status: DC
  Administered 2013-02-13: 25 mg via ORAL
  Filled 2013-02-13 (×2): qty 1

## 2013-02-13 MED ORDER — FERROUS SULFATE 325 (65 FE) MG PO TABS
325.0000 mg | ORAL_TABLET | Freq: Every day | ORAL | Status: DC
  Administered 2013-02-14: 325 mg via ORAL
  Filled 2013-02-13 (×2): qty 1

## 2013-02-13 MED ORDER — ONDANSETRON HCL 4 MG/2ML IJ SOLN: 4.0000 mg | Freq: Four times a day (QID) | INTRAMUSCULAR | Status: DC | PRN

## 2013-02-13 MED ORDER — GLIPIZIDE 10 MG PO TABS
10.0000 mg | ORAL_TABLET | Freq: Two times a day (BID) | ORAL | Status: DC
  Administered 2013-02-14: 10 mg via ORAL
  Filled 2013-02-13 (×3): qty 1

## 2013-02-13 MED ORDER — NITROGLYCERIN 0.3 MG SL SUBL: 0.3000 mg | SUBLINGUAL_TABLET | SUBLINGUAL | Status: DC | PRN

## 2013-02-13 MED ORDER — ACETAMINOPHEN 650 MG RE SUPP: 650.0000 mg | Freq: Four times a day (QID) | RECTAL | Status: DC | PRN

## 2013-02-13 MED ORDER — IRBESARTAN 300 MG PO TABS
300.0000 mg | ORAL_TABLET | Freq: Every day | ORAL | Status: DC
  Administered 2013-02-13: 300 mg via ORAL
  Filled 2013-02-13 (×2): qty 1

## 2013-02-13 MED ORDER — ACETAMINOPHEN 325 MG PO TABS
650.0000 mg | ORAL_TABLET | Freq: Four times a day (QID) | ORAL | Status: DC | PRN
  Administered 2013-02-14: 650 mg via ORAL
  Filled 2013-02-13: qty 2

## 2013-02-13 MED ORDER — ATORVASTATIN CALCIUM 80 MG PO TABS
80.0000 mg | ORAL_TABLET | Freq: Every day | ORAL | Status: DC
  Administered 2013-02-13: 80 mg via ORAL
  Filled 2013-02-13 (×2): qty 1

## 2013-02-13 MED ORDER — MORPHINE SULFATE 2 MG/ML IJ SOLN: 1.0000 mg | INTRAMUSCULAR | Status: DC | PRN

## 2013-02-13 MED ORDER — ONDANSETRON HCL 4 MG PO TABS: 4.0000 mg | ORAL_TABLET | Freq: Four times a day (QID) | ORAL | Status: DC | PRN

## 2013-02-13 MED ORDER — PIOGLITAZONE HCL 45 MG PO TABS
45.0000 mg | ORAL_TABLET | Freq: Every day | ORAL | Status: DC
  Administered 2013-02-14: 45 mg via ORAL
  Filled 2013-02-13: qty 1

## 2013-02-13 MED ORDER — HYDROCODONE-ACETAMINOPHEN 5-325 MG PO TABS: 1.0000 | ORAL_TABLET | ORAL | Status: DC | PRN

## 2013-02-13 MED ORDER — LORAZEPAM 2 MG/ML IJ SOLN: 1.0000 mg | Freq: Four times a day (QID) | INTRAMUSCULAR | Status: DC | PRN

## 2013-02-13 MED ORDER — ADULT MULTIVITAMIN W/MINERALS CH
1.0000 | ORAL_TABLET | Freq: Every day | ORAL | Status: DC
  Administered 2013-02-13 – 2013-02-14 (×2): 1 via ORAL
  Filled 2013-02-13: qty 1

## 2013-02-13 MED ORDER — VALSARTAN-HYDROCHLOROTHIAZIDE 320-25 MG PO TABS: 1.0000 | ORAL_TABLET | Freq: Every day | ORAL | Status: DC

## 2013-02-13 MED ORDER — OMEGA-3 FATTY ACIDS 1000 MG PO CAPS: 1.0000 g | ORAL_CAPSULE | Freq: Every day | ORAL | Status: DC

## 2013-02-13 MED ORDER — SODIUM CHLORIDE 0.9 % IV SOLN
INTRAVENOUS | Status: DC
  Administered 2013-02-14: 03:00:00 via INTRAVENOUS

## 2013-02-13 NOTE — ED Notes (Signed)
IV team at bedside attempted to start IV

## 2013-02-13 NOTE — ED Notes (Signed)
Pt ambulatory to room carrying a cup of coffee, asked pt to abstain from eating/drinking until cleared by EDP. Pt agreeable. Pt in NAD at this time

## 2013-02-13 NOTE — ED Notes (Signed)
Pt states he say his PCP yesterday, hbg 7.4. States they ran a test for blood in his stool and it was negative. Pt denies any symptoms at this time, states he gets tired with exertion.

## 2013-02-13 NOTE — ED Provider Notes (Signed)
CSN: [id number]     Arrival date & time 02/13/13  1039 History   First MD Initiated Contact with Patient 02/13/13 1305     Chief Complaint  Patient presents with  . Low Hgb   . Fatigue  . Dizziness   (Consider location/radiation/quality/duration/timing/severity/associated sxs/prior Treatment) HPI  Patient reports several months ago he noted he was having some black stools. He was seen by his PCP and sent to Dr. Madilyn Fireman, his gastroenterologist. He gave the patient Hemoccult cards which were positive. Patient had had a colonoscopy and endoscopy in 2013 that were normal. He had repeat studies 2 months ago for persistent positive Hemoccult cards. They were reported as normal and his stool was normal at the time he saw Dr. Icard Bing. They discussed possibly doing "camera" test however it has not been scheduled yet. He was seen afterwards and he again had positive Hemoccult cards at his family doctor. He states one month ago he was noted to be anemic with a hemoglobin of 12. He was started on iron pills. Their plan was to be seen in a month for repeat blood work which was actually done yesterday. He had a stool card done again yesterday that was positive. He was called today however that his hemoglobin had dropped to 7 and he was advised to come to the ED. Patient reports he has felt tired and fatigued for several months. He states he wakes up tired and exhausted. He denies dyspnea on exertion and states he walks 4 miles every other day. He denies chest pain, abdominal pain, or bright red blood per rectum.  PCP Dr. Herb Grays Gastroenterologist Dr. Madilyn Fireman  Past Medical History  Diagnosis Date  . Diabetes mellitus without complication   . Hypertension   . Hyperlipidemia   . 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 OSA (obstructive sleep apnea) 03/18/2012    has been cpap intolerant  . 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 CAD (coronary  artery disease), with 60-70% stenosis in RCA 03/18/2012    FFR 0.84; EF 60-65%  . Pulmonary hypertension 12/10/2010    ECHO:  Mild PH,mild LVH; PA pressures estimated 30-40 mmHg  . 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 Shoulder arthroscopy  2006  . Appendectomy  1974  . Carpal tunnel release    . Hemorrhoid surgery    . Eye surgery  1960    Left eye  . Cardiac catheterization  03/27/2010    Moderate mid RCA lesion,right radial approach,normal EF   Family History  Problem Relation Age of Onset  . Coronary artery disease Father   . Kidney failure Mother   . Kidney failure Sister    History  Substance Use Topics  . Smoking status: Former Smoker -- 2.00 packs/day    Quit date: 04/12/2012  . Smokeless tobacco: Never Used  . Alcohol Use: 0.5 oz/week    1 drink(s) per week  lives at [facility] with spouse  Review of Systems  All other systems reviewed and are negative.    Allergies  Review of patient's allergies indicates no known allergies.  Home Medications   Current Outpatient Rx  Name  Route  Sig  Dispense  Refill  . aspirin 325 MG tablet   Oral   Take 325 mg by mouth daily.         Marland Kitchen  Cholecalciferol (VITAMIN D-3) 1000 UNITS CAPS   Oral   Take by mouth daily. 2 capsules.         . ferrous sulfate 325 (65 FE) MG tablet   Oral   Take 325 mg by mouth daily with breakfast.         . fish oil-omega-3 fatty acids 1000 MG capsule   Oral   Take 1 g by mouth daily.         Marland Kitchen glipiZIDE (GLUCOTROL) 10 MG tablet   Oral   Take 10 mg by mouth 2 (two) times daily before a meal.         . Multiple Vitamin (MULTIVITAMIN WITH MINERALS) TABS   Oral   Take 1 tablet by mouth daily.         . nitroGLYCERIN (NITROSTAT) 0.3 MG SL tablet   Sublingual   Place 1 tablet (0.3 mg total) under the tongue every 5 (five) minutes as needed for chest  pain.   25 tablet   4   . pioglitazone (ACTOS) 45 MG tablet   Oral   Take 45 mg by mouth daily.         Marland Kitchen PRESCRIPTION MEDICATION      Uses C-PAP at bedtime         . rosuvastatin (CRESTOR) 40 MG tablet   Oral   Take 40 mg by mouth daily.         . valsartan-hydrochlorothiazide (DIOVAN-HCT) 320-25 MG per tablet   Oral   Take 1 tablet by mouth daily.         Marland Kitchen VICODIN ES 7.5-300 MG TABS   Oral   Take 1 tablet by mouth every 8 (eight) hours as needed (pain).           BP 127/50  Pulse 63  Temp(Src) 98 F (36.7 C) (Oral)  Resp 17  SpO2 98%  Vital signs normal   Physical Exam  Nursing note and vitals reviewed. Constitutional: He is oriented to person, place, and time. He appears well-developed and well-nourished.  Non-toxic appearance. He does not appear ill. No distress.  HENT:  Head: Normocephalic and atraumatic.  Right Ear: External ear normal.  Left Ear: External ear normal.  Nose: Nose normal. No mucosal edema or rhinorrhea.  Mouth/Throat: Oropharynx is clear and moist and mucous membranes are normal. No dental abscesses or uvula swelling.  Eyes: Conjunctivae and EOM are normal. Pupils are equal, round, and reactive to light.  Neck: Normal range of motion and full passive range of motion without pain. Neck supple.  Cardiovascular: Normal rate, regular rhythm and normal heart sounds.  Exam reveals no gallop and no friction rub.   No murmur heard. Pulmonary/Chest: Effort normal and breath sounds normal. No respiratory distress. He has no wheezes. He has no rhonchi. He has no rales. He exhibits no tenderness and no crepitus.  Abdominal: Soft. Normal appearance and bowel sounds are normal. He exhibits no distension. There is no tenderness. There is no rebound and no guarding.  Musculoskeletal: Normal range of motion. He exhibits no edema and no tenderness.  Moves all extremities well.   Neurological: He is alert and oriented to person, place, and time. He has  normal strength. No cranial nerve deficit.  Skin: Skin is warm, dry and intact. No rash noted. No erythema. There is pallor.  Psychiatric: He has a normal mood and affect. His speech is normal and behavior is normal. His mood appears not anxious.    ED  Course  Procedures (including critical care time)  PT prepared for blood transfusion.  [id number] Dr Elisabeth Pigeonevine, admit to med-surg, team 10     Labs Review Results for orders placed during the hospital encounter of 02/13/13  CBC      Result Value Range   WBC 6.1  4.0 - 10.5 K/uL   RBC 3.02 (*) 4.22 - 5.81 MIL/uL   Hemoglobin 8.0 (*) 13.0 - 17.0 g/dL   HCT 96.025.2 (*) 45.439.0 - 09.852.0 %   MCV 83.4  78.0 - 100.0 fL   MCH 26.5  26.0 - 34.0 pg   MCHC 31.7  30.0 - 36.0 g/dL   RDW 11.916.2 (*) 14.711.5 - 82.915.5 %   Platelets 328  150 - 400 K/uL  APTT      Result Value Range   aPTT 30  24 - 37 seconds  PROTIME-INR      Result Value Range   Prothrombin Time 12.1  11.6 - 15.2 seconds   INR 0.91  0.00 - 1.49  COMPREHENSIVE METABOLIC PANEL      Result Value Range   Sodium 144  137 - 147 mEq/L   Potassium 4.1  3.7 - 5.3 mEq/L   Chloride 107  96 - 112 mEq/L   CO2 25  19 - 32 mEq/L   Glucose, Bld 65 (*) 70 - 99 mg/dL   BUN 18  6 - 23 mg/dL   Creatinine, Ser 5.621.32  0.50 - 1.35 mg/dL   Calcium 8.9  8.4 - 13.010.5 mg/dL   Total Protein 6.6  6.0 - 8.3 g/dL   Albumin 3.5  3.5 - 5.2 g/dL   AST 18  0 - 37 U/L   ALT 13  0 - 53 U/L   Alkaline Phosphatase 65  39 - 117 U/L   Total Bilirubin <0.2 (*) 0.3 - 1.2 mg/dL   GFR calc non Af Amer 53 (*) >90 mL/min   GFR calc Af Amer 62 (*) >90 mL/min  URINALYSIS, ROUTINE W REFLEX MICROSCOPIC      Result Value Range   Color, Urine YELLOW  YELLOW   APPearance CLEAR  CLEAR   Specific Gravity, Urine 1.010  1.005 - 1.030   pH 5.5  5.0 - 8.0   Glucose, UA NEGATIVE  NEGATIVE mg/dL   Hgb urine dipstick NEGATIVE  NEGATIVE   Bilirubin Urine NEGATIVE  NEGATIVE   Ketones, ur NEGATIVE  NEGATIVE mg/dL   Protein, ur NEGATIVE  NEGATIVE  mg/dL   Urobilinogen, UA 0.2  0.0 - 1.0 mg/dL   Nitrite NEGATIVE  NEGATIVE   Leukocytes, UA NEGATIVE  NEGATIVE  CBC      Result Value Range   WBC 5.1  4.0 - 10.5 K/uL   RBC 2.70 (*) 4.22 - 5.81 MIL/uL   Hemoglobin 7.3 (*) 13.0 - 17.0 g/dL   HCT 86.522.4 (*) 78.439.0 - 69.652.0 %   MCV 83.0  78.0 - 100.0 fL   MCH 27.0  26.0 - 34.0 pg   MCHC 32.6  30.0 - 36.0 g/dL   RDW 29.516.3 (*) 28.411.5 - 13.215.5 %   Platelets 257  150 - 400 K/uL  TYPE AND SCREEN      Result Value Range   ABO/RH(D) A POS     Antibody Screen NEG     Sample Expiration 02/16/2013     Unit Number [id number]     Blood Component Type RED CELLS,LR     Unit division 00     Status of Unit  ALLOCATED     Transfusion Status OK TO TRANSFUSE     Crossmatch Result Compatible    PREPARE RBC (CROSSMATCH)      Result Value Range   Order Confirmation ORDER PROCESSED BY BLOOD BANK    ABO/RH      Result Value Range   ABO/RH(D) A POS     Laboratory interpretation all normal except anemia    Imaging Review No results found.  EKG Interpretation   None       MDM   1. Anemia   2. Fatigue   3. GI bleeding     Plan admission  Devoria Albe, MD, Franz Dell, MD 02/13/13 [phone]

## 2013-02-13 NOTE — ED Notes (Signed)
Attempted to start IV , unsuccessful.

## 2013-02-13 NOTE — ED Notes (Signed)
Spoke to Lab, labs are contaminated, need to be redrawn.

## 2013-02-13 NOTE — ED Notes (Signed)
Pt was sent here after hgb came back at 7.4.  Reports dizziness, fatigue, feeling heart rate pounding in neck, and dark stools

## 2013-02-13 NOTE — H&P (Signed)
[facility] History and Physical  Brett CanalesJames Herrle ZOX:[id number] DOB: [birth year] DOA: 02/13/2013  Referring physician: ER physician PCP: Herb GraysSPEAR, TAMMY, MD   Chief Complaint: dark stool  HPI:  68 year old male with past medical history of hypertension, diabetes, dyslipidemia, CAD (on aspirin) who presented [facility] ED 02/13/2013 with complaints of having ongoing black stools, weakness, fatigue for past month or so. Pt also reported having intermittent palpitations but reports no palpitations at this time. He was apparently recently evaluated by [facility] Madilyn [facility]Hayes of [facility] but no definitive diagnostic studies were done. He did have unremarkable EGD and colonoscopy in 2014. No reports of diarrhea. No fever or chills. No chest pain.  In ED, pt is hemodynamically stable with BP of 144/63, HR 64 and Tmax  16F. His initial Hgb was 8 and then 7.3. Blood transfusion to be started in ED. His blood work was otherwise unremarkable.  I spoke with Dr. Bosie ClosSchooler of [facility] and he reported they will see the pt in am.  Assessment and Plan:  Principal Problem:   Acute blood loss anemia - unclear etiology, check FOBT - hemoglobin on admission 8 and repeat 7.3 and pt to receive 1 unit PRBC in ED - hold aspirin for now - GI consulted (pt is following with [facility] ) Active Problems:   HTN (hypertension) - may continue DIovan   DM (diabetes mellitus) - on actos and glipizide   CAD (coronary artery disease), with 60-70% stenosis in RCA - hold aspirin due to anemia   Dyslipidemia, goal LDL below 70 - continue Lipitor   Radiological Exams on Admission: No results found.   Code Status: Full Family Communication: Pt at bedside Disposition Plan: Admit for further evaluation  Manson PasseyEVINE, ALMA, MD  [facility] Pager [phone]  Review of Systems:  Constitutional: Negative for fever, chills and positive for malaise/fatigue. Negative for diaphoresis.  HENT: Negative for hearing loss, ear pain,  nosebleeds, congestion, sore throat, neck pain, tinnitus and ear discharge.   Eyes: Negative for blurred vision, double vision, photophobia, pain, discharge and redness.  Respiratory: Negative for cough, hemoptysis, sputum production, shortness of breath, wheezing and stridor.   Cardiovascular: Negative for chest pain, palpitations, orthopnea, claudication and leg swelling.  Gastrointestinal: per HPI Genitourinary: Negative for dysuria, urgency, frequency, hematuria and flank pain.  Musculoskeletal: Negative for myalgias, back pain, joint pain and falls.  Skin: Negative for itching and rash.  Neurological: Negative for dizziness and positive for weakness. Negative for tingling, tremors, sensory change, speech change, focal weakness, loss of consciousness and headaches.  Endo/Heme/Allergies: Negative for environmental allergies and polydipsia. Does not bruise/bleed easily.  Psychiatric/Behavioral: Negative for suicidal ideas. The patient is not nervous/anxious.      Past Medical History  Diagnosis Date  . Diabetes mellitus without complication   . Hypertension   . Hyperlipidemia   . 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 OSA (obstructive sleep apnea) 03/18/2012    has been cpap intolerant  . 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 Pulmonary hypertension 12/10/2010    ECHO:  Mild PH,mild LVH; PA pressures estimated 30-40 mmHg  . 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 Shoulder arthroscopy  2006  .  Appendectomy  1974  . Carpal tunnel release    . Hemorrhoid surgery    . Eye surgery  1960    Left eye  . Cardiac catheterization  03/27/2010    Moderate mid RCA lesion,right radial approach,normal EF   Social  History:  reports that he quit smoking about 10 months ago. He has never used smokeless tobacco. He reports that he drinks about 0.5 ounces of alcohol per week. He reports that he uses illicit drugs (Marijuana).  No Known Allergies  Family History:  Family History  Problem Relation Age of Onset  . Coronary artery disease Father   . Kidney failure Mother   . Kidney failure Sister      Prior to Admission medications   Medication Sig Start Date End Date Taking? Authorizing Provider  aspirin 325 MG tablet Take 325 mg by mouth daily.   Yes Historical Provider, MD  Cholecalciferol (VITAMIN D-3) 1000 UNITS CAPS Take by mouth daily. 2 capsules.   Yes Historical Provider, MD  ferrous sulfate 325 (65 FE) MG tablet Take 325 mg by mouth daily with breakfast.   Yes Historical Provider, MD  fish oil-omega-3 fatty acids 1000 MG capsule Take 1 g by mouth daily.   Yes Historical Provider, MD  glipiZIDE (GLUCOTROL) 10 MG tablet Take 10 mg by mouth 2 (two) times daily before a meal.   Yes Historical Provider, MD  Multiple Vitamin (MULTIVITAMIN WITH MINERALS) TABS Take 1 tablet by mouth daily.   Yes Historical Provider, MD  nitroGLYCERIN (NITROSTAT) 0.3 MG SL tablet Place 1 tablet (0.3 mg total) under the tongue every 5 (five) minutes as needed for chest pain. 03/18/12  Yes Nada Boozer, NP  pioglitazone (ACTOS) 45 MG tablet Take 45 mg by mouth daily.   Yes Historical Provider, MD  PRESCRIPTION MEDICATION Uses C-PAP at bedtime   Yes Historical Provider, MD  rosuvastatin (CRESTOR) 40 MG tablet Take 40 mg by mouth daily.   Yes Historical Provider, MD  valsartan-hydrochlorothiazide (DIOVAN-HCT) 320-25 MG per tablet Take 1 tablet by mouth daily.   Yes Historical Provider, MD  VICODIN ES 7.5-300 MG TABS Take 1 tablet by mouth every 8 (eight) hours as needed (pain).  08/18/12  Yes Historical Provider, MD   Physical Exam: Filed Vitals:   02/13/13 1304 02/13/13 1315 02/13/13 1415 02/13/13 1646  BP: 133/44 119/52  127/50 139/64  Pulse: 71 68 63 80  Temp:      TempSrc:      Resp: 22 19 17 18   SpO2: 98% 97% 98% 100%    Physical Exam  Constitutional: Appears well-developed and well-nourished. No distress.  HENT: Normocephalic. External right and left ear normal. Oropharynx is clear and moist.  Eyes: Conjunctivae and EOM are normal. PERRLA, no scleral icterus.  Neck: Normal ROM. Neck supple. No JVD. No tracheal deviation. No thyromegaly.  CVS: RRR, S1/S2 appreciated  Pulmonary: Effort and breath sounds normal, no stridor, rhonchi, wheezes, rales.  Abdominal: Soft. BS +,  no distension, tenderness, rebound or guarding.  Musculoskeletal: Normal range of motion. No edema and no tenderness.  Lymphadenopathy: No lymphadenopathy noted, cervical, inguinal. Neuro: Alert. No focal neurologic deficits.  Skin: Skin is warm and dry. Psychiatric: Normal mood and affect. Behavior, judgment, thought content normal.   Labs on Admission:  Basic Metabolic Panel:  Recent Labs Lab 02/13/13 1525  NA 144  K 4.1  CL 107  CO2 25  GLUCOSE 65*  BUN 18  CREATININE 1.32  CALCIUM 8.9   Liver Function  Tests:  Recent Labs Lab 02/13/13 1525  AST 18  ALT 13  ALKPHOS 65  BILITOT <0.2*  PROT 6.6  ALBUMIN 3.5   No results found for this basename: LIPASE, AMYLASE,  in the last 168 hours No results found for this basename: AMMONIA,  in the last 168 hours CBC:  Recent Labs Lab 02/13/13 1052 02/13/13 1525  WBC 6.1 5.1  HGB 8.0* 7.3*  HCT 25.2* 22.4*  MCV 83.4 83.0  PLT 328 257   Cardiac Enzymes: No results found for this basename: CKTOTAL, CKMB, CKMBINDEX, TROPONINI,  in the last 168 hours BNP: No components found with this basename: POCBNP,  CBG: No results found for this basename: GLUCAP,  in the last 168 hours  If 7PM-7AM, please contact night-coverage [email or web address] Password [email or web address] 02/13/2013, 5:10 PM

## 2013-02-13 NOTE — ED Notes (Signed)
IV team paged.  

## 2013-02-14 ENCOUNTER — Inpatient Hospital Stay (HOSPITAL_COMMUNITY): Payer: Medicare Other

## 2013-02-14 ENCOUNTER — Encounter (HOSPITAL_COMMUNITY): Payer: Self-pay | Admitting: Gastroenterology

## 2013-02-14 DIAGNOSIS — D649 Anemia, unspecified: Secondary | ICD-10-CM | POA: Diagnosis not present

## 2013-02-14 DIAGNOSIS — K8020 Calculus of gallbladder without cholecystitis without obstruction: Secondary | ICD-10-CM | POA: Diagnosis not present

## 2013-02-14 DIAGNOSIS — K449 Diaphragmatic hernia without obstruction or gangrene: Secondary | ICD-10-CM | POA: Diagnosis not present

## 2013-02-14 DIAGNOSIS — E119 Type 2 diabetes mellitus without complications: Secondary | ICD-10-CM | POA: Diagnosis not present

## 2013-02-14 DIAGNOSIS — D62 Acute posthemorrhagic anemia: Secondary | ICD-10-CM | POA: Diagnosis not present

## 2013-02-14 DIAGNOSIS — K921 Melena: Secondary | ICD-10-CM | POA: Diagnosis not present

## 2013-02-14 DIAGNOSIS — G4733 Obstructive sleep apnea (adult) (pediatric): Secondary | ICD-10-CM

## 2013-02-14 DIAGNOSIS — K922 Gastrointestinal hemorrhage, unspecified: Secondary | ICD-10-CM

## 2013-02-14 DIAGNOSIS — I2510 Atherosclerotic heart disease of native coronary artery without angina pectoris: Secondary | ICD-10-CM | POA: Diagnosis not present

## 2013-02-14 LAB — CBC
HEMATOCRIT: 24.7 % — AB (ref 39.0–52.0)
Hemoglobin: 8.3 g/dL — ABNORMAL LOW (ref 13.0–17.0)
MCH: 27.3 pg (ref 26.0–34.0)
MCHC: 33.6 g/dL (ref 30.0–36.0)
MCV: 81.3 fL (ref 78.0–100.0)
Platelets: 280 10*3/uL (ref 150–400)
RBC: 3.04 MIL/uL — AB (ref 4.22–5.81)
RDW: 15.9 % — AB (ref 11.5–15.5)
WBC: 5.4 10*3/uL (ref 4.0–10.5)

## 2013-02-14 LAB — COMPREHENSIVE METABOLIC PANEL
ALK PHOS: 61 U/L (ref 39–117)
ALT: 11 U/L (ref 0–53)
AST: 19 U/L (ref 0–37)
Albumin: 3.3 g/dL — ABNORMAL LOW (ref 3.5–5.2)
BILIRUBIN TOTAL: 0.2 mg/dL — AB (ref 0.3–1.2)
BUN: 16 mg/dL (ref 6–23)
CO2: 21 mEq/L (ref 19–32)
CREATININE: 1.13 mg/dL (ref 0.50–1.35)
Calcium: 9.1 mg/dL (ref 8.4–10.5)
Chloride: 104 mEq/L (ref 96–112)
GFR calc non Af Amer: 64 mL/min — ABNORMAL LOW (ref >90)
GFR, EST AFRICAN AMERICAN: 75 mL/min — AB (ref >90)
GLUCOSE: 98 mg/dL (ref 70–99)
POTASSIUM: 3.9 meq/L (ref 3.7–5.3)
Sodium: 140 mEq/L (ref 137–147)
TOTAL PROTEIN: 6.3 g/dL (ref 6.0–8.3)

## 2013-02-14 LAB — GLUCOSE, CAPILLARY: GLUCOSE-CAPILLARY: 104 mg/dL — AB (ref 70–99)

## 2013-02-14 MED ORDER — IOHEXOL 300 MG/ML  SOLN
25.0000 mL | INTRAMUSCULAR | Status: AC
  Administered 2013-02-14: 25 mL via ORAL

## 2013-02-14 MED ORDER — FERROUS SULFATE 325 (65 FE) MG PO TABS: 325.0000 mg | ORAL_TABLET | Freq: Two times a day (BID) | ORAL | Status: DC

## 2013-02-14 MED ORDER — IOHEXOL 300 MG/ML  SOLN
100.0000 mL | Freq: Once | INTRAMUSCULAR | Status: AC | PRN
  Administered 2013-02-14: 100 mL via INTRAVENOUS

## 2013-02-14 MED ORDER — PANTOPRAZOLE SODIUM 40 MG PO TBEC: 40.0000 mg | DELAYED_RELEASE_TABLET | Freq: Two times a day (BID) | ORAL | Status: DC

## 2013-02-14 MED ORDER — OMEPRAZOLE 40 MG PO CPDR: 40.0000 mg | DELAYED_RELEASE_CAPSULE | Freq: Every day | ORAL | Status: DC

## 2013-02-14 NOTE — Consult Note (Signed)
Reason for Consult: Guaiac positive anemia in patient on aspirin and no pump inhibitor Referring Physician: Hospital team  James Jones is an 68 y.o. male.  HPI: Patient seen at the request of the hospital team and familiar to a few of my partners with a colonoscopy and endoscopy earlier this year an endoscopy last year and a colonoscopy in 2011 and his office chart and hospital computer chart was reviewed and he does have once or twice a week significant reflux but does not take any medicine for that and is on a full aspirin a day and has been on some iron for about a month although has had black stools even before that and he did lose some weight and was able to get off insulin and he has no other GI complaints but he has had some symptomatic anemia lately  Past Medical History  Diagnosis Date  . Diabetes mellitus without complication   . Hypertension   . Hyperlipidemia   . 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 OSA (obstructive sleep apnea) 03/18/2012    has been cpap intolerant  . 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 Pulmonary hypertension 12/10/2010    ECHO:  Mild PH,mild LVH; PA pressures estimated 30-40 mmHg  . Wenckebach 04/09/2012-05/09/2012    Event monitor; usually during sleeping hours; therefore not on beta blocker    Past Surgical History  Procedure Laterality Date  . Shoulder arthroscopy  2006  . Appendectomy  1974  . Carpal tunnel release    . Hemorrhoid surgery    . Eye surgery  1960    Left eye  . Cardiac catheterization  03/27/2010    Moderate mid RCA lesion,right radial approach,normal EF    Family History  Problem Relation Age of Onset  . Coronary artery disease Father   . Kidney failure Mother   . Kidney failure Sister     Social History:  reports that he quit smoking about 10 months ago. He has  never used smokeless tobacco. He reports that he drinks about 0.5 ounces of alcohol per week. He reports that he uses illicit drugs (Marijuana).  Allergies: No Known Allergies  Medications: I have reviewed the patient's current medications.  Results for orders placed during the hospital encounter of 02/13/13 (from the past 48 hour(s))  CBC     Status: Abnormal   Collection Time    02/13/13 10:52 AM      Result Value Range   WBC 6.1  4.0 - 10.5 K/uL   RBC 3.02 (*) 4.22 - 5.81 MIL/uL   Hemoglobin 8.0 (*) 13.0 - 17.0 g/dL   HCT 25.2 (*) 39.0 - 52.0 %   MCV 83.4  78.0 - 100.0 fL   MCH 26.5  26.0 - 34.0 pg   MCHC 31.7  30.0 - 36.0 g/dL   RDW 16.2 (*) 11.5 - 15.5 %   Platelets 328  150 - 400 K/uL  URINALYSIS, ROUTINE W REFLEX MICROSCOPIC     Status: None   Collection Time    02/13/13  2:10 PM      Result Value Range   Color, Urine YELLOW  YELLOW   APPearance CLEAR  CLEAR   Specific Gravity, Urine 1.010  1.005 - 1.030   pH 5.5  5.0 - 8.0   Glucose, UA NEGATIVE  NEGATIVE mg/dL   Hgb urine dipstick NEGATIVE  NEGATIVE   Bilirubin Urine NEGATIVE  NEGATIVE   Ketones, ur NEGATIVE  NEGATIVE mg/dL   Protein, ur NEGATIVE  NEGATIVE mg/dL   Urobilinogen, UA 0.2  0.0 - 1.0 mg/dL   Nitrite NEGATIVE  NEGATIVE   Leukocytes, UA NEGATIVE  NEGATIVE   Comment: MICROSCOPIC NOT DONE ON URINES WITH NEGATIVE PROTEIN, BLOOD, LEUKOCYTES, NITRITE, OR GLUCOSE <1000 mg/dL.  TYPE AND SCREEN     Status: None   Collection Time    02/13/13  3:25 PM      Result Value Range   ABO/RH(D) A POS     Antibody Screen NEG     Sample Expiration 02/16/2013     Unit Number [id number]     Blood Component Type RED CELLS,LR     Unit division 00     Status of Unit ISSUED,FINAL     Transfusion Status OK TO TRANSFUSE     Crossmatch Result Compatible     Unit Number [id number]     Blood Component Type RED CELLS,LR     Unit division 00     Status of Unit ALLOCATED     Transfusion Status OK TO TRANSFUSE      Crossmatch Result Compatible    APTT     Status: None   Collection Time    02/13/13  3:25 PM      Result Value Range   aPTT 30  24 - 37 seconds  PROTIME-INR     Status: None   Collection Time    02/13/13  3:25 PM      Result Value Range   Prothrombin Time 12.1  11.6 - 15.2 seconds   INR 0.91  0.00 - 1.49  PREPARE RBC (CROSSMATCH)     Status: None   Collection Time    02/13/13  3:25 PM      Result Value Range   Order Confirmation ORDER PROCESSED BY BLOOD BANK    COMPREHENSIVE METABOLIC PANEL     Status: Abnormal   Collection Time    02/13/13  3:25 PM      Result Value Range   Sodium 144  137 - 147 mEq/L   Potassium 4.1  3.7 - 5.3 mEq/L   Chloride 107  96 - 112 mEq/L   CO2 25  19 - 32 mEq/L   Glucose, Bld 65 (*) 70 - 99 mg/dL   BUN 18  6 - 23 mg/dL   Creatinine, Ser 1.32  0.50 - 1.35 mg/dL   Calcium 8.9  8.4 - 10.5 mg/dL   Total Protein 6.6  6.0 - 8.3 g/dL   Albumin 3.5  3.5 - 5.2 g/dL   AST 18  0 - 37 U/L   ALT 13  0 - 53 U/L   Alkaline Phosphatase 65  39 - 117 U/L   Total Bilirubin <0.2 (*) 0.3 - 1.2 mg/dL   GFR calc non Af Amer 53 (*) >90 mL/min   GFR calc Af Amer 62 (*) >90 mL/min   Comment: (NOTE)     The eGFR has been calculated using the CKD EPI equation.     This calculation has not been validated in all clinical situations.     eGFR's persistently <90 mL/min signify possible Chronic Kidney     Disease.  CBC     Status: Abnormal   Collection Time    02/13/13  3:25 PM      Result Value Range   WBC  5.1  4.0 - 10.5 K/uL   RBC 2.70 (*) 4.22 - 5.81 MIL/uL   Hemoglobin 7.3 (*) 13.0 - 17.0 g/dL   HCT 22.4 (*) 39.0 - 52.0 %   MCV 83.0  78.0 - 100.0 fL   MCH 27.0  26.0 - 34.0 pg   MCHC 32.6  30.0 - 36.0 g/dL   RDW 16.3 (*) 11.5 - 15.5 %   Platelets 257  150 - 400 K/uL  ABO/RH     Status: None   Collection Time    02/13/13  3:25 PM      Result Value Range   ABO/RH(D) A POS    MAGNESIUM     Status: None   Collection Time    02/13/13  3:25 PM      Result Value  Range   Magnesium 1.9  1.5 - 2.5 mg/dL  PHOSPHORUS     Status: None   Collection Time    02/13/13  3:25 PM      Result Value Range   Phosphorus 3.9  2.3 - 4.6 mg/dL  CBC WITH DIFFERENTIAL     Status: None   Collection Time    02/13/13  3:25 PM      Result Value Range   WBC DUPLICATE REQUEST SEE [id number]  4.0 - 38.7 K/uL   RBC DUPLICATE REQUEST SEE [id number]  4.22 - 5.81 MIL/uL   Hemoglobin DUPLICATE REQUEST SEE [id number]  13.0 - 16.6 g/dL   HCT DUPLICATE REQUEST SEE [id number]  39.0 - 01.0 %   MCV DUPLICATE REQUEST SEE [id number]  78.0 - 732.2 fL   MCH DUPLICATE REQUEST SEE [id number]  26.0 - 06.2 pg   MCHC DUPLICATE REQUEST SEE [id number]  30.0 - 15.1 g/dL   RDW DUPLICATE REQUEST SEE [id number]  11.5 - 15.5 %   Platelets DUPLICATE REQUEST SEE [id number]  150 - 400 K/uL   Neutrophils Relative % 64  43 - 77 %   Neutro Abs 3.4  1.7 - 7.7 K/uL   Lymphocytes Relative 29  12 - 46 %   Lymphs Abs 1.5  0.7 - 4.0 K/uL   Monocytes Relative 7  3 - 12 %   Monocytes Absolute 0.4  0.1 - 1.0 K/uL   Eosinophils Relative 1  0 - 5 %   Eosinophils Absolute 0.1  0.0 - 0.7 K/uL   Basophils Relative 0  0 - 1 %   Basophils Absolute 0.0  0.0 - 0.1 K/uL  PREPARE RBC (CROSSMATCH)     Status: None   Collection Time    02/13/13  6:30 PM      Result Value Range   Order Confirmation ORDER PROCESSED BY BLOOD BANK    COMPREHENSIVE METABOLIC PANEL     Status: Abnormal   Collection Time    02/14/13  5:30 AM      Result Value Range   Sodium 140  137 - 147 mEq/L   Potassium 3.9  3.7 - 5.3 mEq/L   Chloride 104  96 - 112 mEq/L   CO2 21  19 - 32 mEq/L   Glucose, Bld 98  70 - 99 mg/dL   BUN 16  6 - 23 mg/dL   Creatinine, Ser 1.13  0.50 - 1.35 mg/dL   Calcium 9.1  8.4 - 10.5 mg/dL   Total Protein 6.3  6.0 - 8.3 g/dL   Albumin 3.3 (*) 3.5 - 5.2 g/dL   AST 19  0 - 37 U/L   ALT 11  0 -  53 U/L   Alkaline Phosphatase 61  39 - 117 U/L   Total Bilirubin 0.2 (*) 0.3 - 1.2 mg/dL   GFR calc non Af Amer 64 (*) >90 mL/min   GFR calc Af Amer 75  (*) >90 mL/min   Comment: (NOTE)     The eGFR has been calculated using the CKD EPI equation.     This calculation has not been validated in all clinical situations.     eGFR's persistently <90 mL/min signify possible Chronic Kidney     Disease.  CBC     Status: Abnormal   Collection Time    02/14/13  5:30 AM      Result Value Range   WBC 5.4  4.0 - 10.5 K/uL   RBC 3.04 (*) 4.22 - 5.81 MIL/uL   Hemoglobin 8.3 (*) 13.0 - 17.0 g/dL   HCT 24.7 (*) 39.0 - 52.0 %   MCV 81.3  78.0 - 100.0 fL   MCH 27.3  26.0 - 34.0 pg   MCHC 33.6  30.0 - 36.0 g/dL   RDW 15.9 (*) 11.5 - 15.5 %   Platelets 280  150 - 400 K/uL  GLUCOSE, CAPILLARY     Status: Abnormal   Collection Time    02/14/13  7:19 AM      Result Value Range   Glucose-Capillary 104 (*) 70 - 99 mg/dL    No results found.  ROS negative except above and patient eating regular food this morning without problem and no signs of active bleeding Blood pressure 126/53, pulse 65, temperature 98.3 F (36.8 C), temperature source Oral, resp. rate 18, weight 103.103 kg (227 lb 4.8 oz), SpO2 96.00%. Physical Exam vital signs stable afebrile no acute distress abdomen is soft nontender  Assessment/Plan: Multiple medical problems including wide positive anemia in a patient on aspirin and no pump inhibitors Plan: Would consider a repeat CT scan since it's been over a year and a half just to rule out anything big or bad and otherwise would hold aspirin for as long as possible if not long-term and put him on twice a day protonix and I will see him back in the office in one month to repeat guaiac cards CBC and make sure further workup and plans does not need to be done unless CT showed something significant it would be okay with me to go home and I could see sooner when necessary and would continue iron for now  [facility] E 02/14/2013, 9:54 AM

## 2013-02-14 NOTE — Discharge Instructions (Addendum)
Diets for Diabetes, Food Labeling Look at food labels to help you decide how much of a product you can eat. You will want to check the amount of total carbohydrate in a serving to see how the food fits into your meal plan. In the list of ingredients, the ingredient present in the largest amount by weight must be listed first, followed by the other ingredients in descending order. STANDARD OF IDENTITY Most products have a list of ingredients. However, foods that the Food and Drug Administration (FDA) has given a standard of identity do not need a list of ingredients. A standard of identity means that a food must contain certain ingredients if it is called a particular name. Examples are mayonnaise, peanut butter, ketchup, jelly, and cheese. LABELING TERMS There are many terms found on food labels. Some of these terms have specific definitions. Some terms are regulated by the FDA, and the FDA has clearly specified how they can be used. Others are not regulated or well-defined and can be misleading and confusing. SPECIFICALLY DEFINED TERMS Nutritive Sweetener.  A sweetener that contains calories,such as table sugar or honey. Nonnutritive Sweetener.  A sweetener with few or no calories,such as saccharin, aspartame, sucralose, and cyclamate. LABELING TERMS REGULATED BY THE FDA Free.  The product contains only a tiny or small amount of fat, cholesterol, sodium, sugar, or calories. For example, a "fat-free" product will contain less than 0.5 g of fat per serving. Low.  A food described as "low" in fat, saturated fat, cholesterol, sodium, or calories could be eaten fairly often without exceeding dietary guidelines. For example, "low in fat" means no more than 3 g of fat per serving. Lean.  "Lean" and "extra lean" are U.S. Department of Agriculture (USDA) terms for use on meat and poultry products. "Lean" means the product contains less than 10 g of fat, 4 g of saturated fat, and 95 mg of cholesterol  per serving. "Lean" is not as low in fat as a product labeled "low." Extra Lean.  "Extra lean" means the product contains less than 5 g of fat, 2 g of saturated fat, and 95 mg of cholesterol per serving. While "extra lean" has less fat than "lean," it is still higher in fat than a product labeled "low." Reduced, Less, Fewer.  A diet product that contains 25% less of a nutrient or calories than the regular version. For example, hot dogs might be labeled "25% less fat than our regular hot dogs." Light/Lite.  A diet product that contains  fewer calories or  the fat of the original. For example, "light in sodium" means a product with  the usual sodium. More.  One serving contains at least 10% more of the daily value of a vitamin, mineral, or fiber than usual. Good Source Of.  One serving contains 10% to 19% of the daily value for a particular vitamin, mineral, or fiber. Excellent Source Of.  One serving contains 20% or more of the daily value for a particular nutrient. Other terms used might be "high in" or "rich in." Enriched or Fortified.  The product contains added vitamins, minerals, or protein. Nutrition labeling must be used on enriched or fortified foods. Imitation.  The product has been altered so that it is lower in protein, vitamins, or minerals than the usual food,such as imitation peanut butter. Total Fat.  The number listed is the total of all fat found in a serving of the product. Under total fat, food labels must list saturated fat and   trans fat, which are associated with raising bad cholesterol and an increased risk of heart blood vessel disease. Saturated Fat.  Mainly fats from animal-based sources. Some examples are red meat, cheese, cream, whole milk, and coconut oil. Trans Fat.  Found in some fried snack foods, packaged foods, and fried restaurant foods. It is recommended you eat as close to 0 g of trans fat as possible, since it raises bad cholesterol and lowers  good cholesterol. Polyunsaturated and Monounsaturated Fats.  More healthful fats. These fats are from plant sources. Total Carbohydrate.  The number of carbohydrate grams in a serving of the product. Under total carbohydrate are listed the other carbohydrate sources, such as dietary fiber and sugars. Dietary Fiber.  A carbohydrate from plant sources. Sugars.  Sugars listed on the label contain all naturally occurring sugars as well as added sugars. LABELING TERMS NOT REGULATED BY THE FDA Sugarless.  Table sugar (sucrose) has not been added. However, the manufacturer may use another form of sugar in place of sucrose to sweeten the product. For example, sugar alcohols are used to sweeten foods. Sugar alcohols are a form of sugar but are not table sugar. If a product contains sugar alcohols in place of sucrose, it can still be labeled "sugarless." Low Salt, Salt-Free, Unsalted, No Salt, No Salt Added, Without Added Salt.  Food that is usually processed with salt has been made without salt. However, the food may contain sodium-containing additives, such as preservatives, leavening agents, or flavorings. Natural.  This term has no legal meaning. Organic.  Foods that are certified as organic have been inspected and approved by the USDA to ensure they are produced without pesticides, fertilizers containing synthetic ingredients, bioengineering, or ionizing radiation. Document Released: 01/28/2003 Document Revised: 04/19/2011 Document Reviewed: 08/15/2008 [employer] Patient Information 2014 [employer].  

## 2013-02-14 NOTE — Progress Notes (Signed)
Inpatient Diabetes Program Recommendations  AACE/ADA: New Consensus Statement on Inpatient Glycemic Control (2013)  Target Ranges:  Prepandial:   less than 140 mg/dL      Peak postprandial:   less than 180 mg/dL (1-2 hours)      Critically ill patients:  140 - 180 mg/dL     **Patient admitted with anemia.  Has + history of DM.  Takes Actos and Glipizide at home.  **Noted home doses of Actos and Glipizide restarted here in hospital.  CBGs only being checked QAM.   **MD- Please change order for CBG checks to tid ac + HS while patient here in hospital.   Will follow. Ambrose FinlandJeannine Johnston Bonney Berres RN, MSN, CDE Diabetes Coordinator Inpatient Diabetes Program Team Pager: [phone] (8a-10p)

## 2013-02-14 NOTE — Discharge Summary (Signed)
Physician Discharge Summary  James Jones ZOX:[id number] DOB: 06-22-1945 DOA: 02/13/2013  PCP: Herb Grays, MD  Admit date: 02/13/2013 Discharge date: 02/14/2013  Time greater than 30 min  Recommendations for Outpatient Follow-up:  1. Repeat CBC  Discharge Diagnoses:  Principal Problem:   Acute blood loss anemia Active Problems:   HTN (hypertension)   DM (diabetes mellitus)   OSA (obstructive sleep apnea), has been cpap intolerant   CAD (coronary artery disease), with 60-70% stenosis in RCA   Obesity (BMI 30-39.9); 32.6 on 10/02/2012   Dyslipidemia, goal LDL below 70   Discharge Condition: stable  Filed Weights   02/14/13 0505  Weight: 103.103 kg (227 lb 4.8 oz)    History of present illness:  68 year old male with past medical history of hypertension, diabetes, dyslipidemia, CAD (on aspirin) who presented to [facility] ED 02/13/2013 with complaints of having ongoing black stools, weakness, fatigue for past month or so. Pt also reported having intermittent palpitations but reports no palpitations at this time. He was apparently recently evaluated by Dr. Madilyn Fireman of [facility] but no definitive diagnostic studies were done. He did have unremarkable EGD and colonoscopy in 2014. No reports of diarrhea. No fever or chills. No chest pain.  In ED, pt is hemodynamically stable with BP of 144/63, HR 64 and Tmax 87F. His initial Hgb was 8 and then 7.3. Blood transfusion to be started in ED. His blood work was otherwise unremarkable.  I spoke with Dr. Bosie Clos of [facility] and he reported they will see the pt in am.   Hospital Course:  Patient was transfused 1 unit pRBC. Dr. Ewing Schlein consulted and recommended repeat CT scan (which showed nothing acute), hold aspirin for as long as possible if not long-term and put him on twice a day protonix f/u with him in one month to repeat guaiac cards CBC.    Procedures:  none  Consultations:  [facility]  Discharge Exam: Filed Vitals:   02/14/13 1329  BP: 144/68   Pulse: 76  Temp: 97.8 F (36.6 C)  Resp: 18    General: comfortable Cardiovascular: RRR Respiratory: CTA abd S, NT  Discharge Instructions  Discharge Orders   Future Orders Complete By Expires   Diet - low sodium heart healthy  As directed    Increase activity slowly  As directed        Medication List    STOP taking these medications       aspirin 325 MG tablet      TAKE these medications       ferrous sulfate 325 (65 FE) MG tablet  Take 1 tablet (325 mg total) by mouth 2 (two) times daily with a meal.     fish oil-omega-3 fatty acids 1000 MG capsule  Take 1 g by mouth daily.     glipiZIDE 10 MG tablet  Commonly known as:  GLUCOTROL  Take 10 mg by mouth 2 (two) times daily before a meal.     multivitamin with minerals Tabs tablet  Take 1 tablet by mouth daily.     nitroGLYCERIN 0.3 MG SL tablet  Commonly known as:  NITROSTAT  Place 1 tablet (0.3 mg total) under the tongue every 5 (five) minutes as needed for chest pain.     pantoprazole 40 MG tablet  Commonly known as:  PROTONIX  Take 1 tablet (40 mg total) by mouth 2 (two) times daily.     pioglitazone 45 MG tablet  Commonly known as:  ACTOS  Take 45 mg by mouth daily.     PRESCRIPTION MEDICATION  Uses C-PAP at bedtime     rosuvastatin 40 MG tablet  Commonly known as:  CRESTOR  Take 40 mg by mouth daily.     valsartan-hydrochlorothiazide 320-25 MG per tablet  Commonly known as:  DIOVAN-HCT  Take 1 tablet by mouth daily.     VICODIN ES 7.5-300 MG Tabs  Generic drug:  Hydrocodone-Acetaminophen  Take 1 tablet by mouth every 8 (eight) hours as needed (pain).     Vitamin D-3 1000 UNITS Caps  Take by mouth daily. 2 capsules.       No Known Allergies     Follow-up Information   Follow up with Herb GraysSPEAR, TAMMY, MD.   Specialty:  Family Medicine   Contact information:   [address] [place] [address] [phone]        The results of significant  diagnostics from this hospitalization (including imaging, microbiology, ancillary and laboratory) are listed below for reference.    Significant Diagnostic Studies: Ct Abdomen Pelvis W Contrast  02/14/2013   CLINICAL DATA:  GI bleed.  Weight loss.  Abdominal pain.  EXAM: CT ABDOMEN AND PELVIS WITH CONTRAST  TECHNIQUE: Multidetector CT imaging of the abdomen and pelvis was performed using the standard protocol following bolus administration of intravenous contrast.  CONTRAST:  100mL OMNIPAQUE IOHEXOL 300 MG/ML  SOLN  COMPARISON:  CT 05/24/2011  FINDINGS: Visualization of the lower thorax demonstrates minimal dependent atelectasis. No pleural effusion.  Liver is normal in size and contour without focal hepatic lesion identified. Gallbladder is decompressed. Small punctate gallstone. Portal vein is patent. The spleen and pancreas are unremarkable. Unchanged mild nodularity/thickening of the bilateral adrenal glands.  Kidneys enhance symmetrically with contrast. Multiple bilateral renal cysts are grossly stable when compared to prior CT and MRI examinations.  Normal caliber abdominal aorta. No retroperitoneal lymphadenopathy. Prostate is grossly unremarkable. Urinary bladder is decompressed however there is suggestion of wall thickening.  The sigmoid colon and rectum are decompressed. Oral contrast material is demonstrated throughout the bowel. No free fluid or free intraperitoneal air. No evidence for bowel obstruction.  No aggressive appearing osseous lesions. Lower lumbar spine degenerative change.  IMPRESSION: 1. No acute findings. 2. Stable adrenal nodularity. 3. Urinary bladder is decompressed and poorly evaluated however there may be wall thickening. This can be correlated with urinalysis to exclude cystitis as clinically indicated.   Electronically Signed   By: Annia Beltrew  Davis M.D.   On: 02/14/2013 14:20    Microbiology: No results found for this or any previous visit (from the past 240 hour(s)).    Labs: Basic Metabolic Panel:  Recent Labs Lab 02/13/13 1525 02/14/13 0530  NA 144 140  K 4.1 3.9  CL 107 104  CO2 25 21  GLUCOSE 65* 98  BUN 18 16  CREATININE 1.32 1.13  CALCIUM 8.9 9.1  MG 1.9  --   PHOS 3.9  --    Liver Function Tests:  Recent Labs Lab 02/13/13 1525 02/14/13 0530  AST 18 19  ALT 13 11  ALKPHOS 65 61  BILITOT <0.2* 0.2*  PROT 6.6 6.3  ALBUMIN 3.5 3.3*   No results found for this basename: LIPASE, AMYLASE,  in the last 168 hours No results found for this basename: AMMONIA,  in the last 168 hours CBC:  Recent Labs Lab 02/13/13 1052 02/13/13 1525 02/14/13 0530  WBC 6.1 5.1  DUPLICATE REQUEST SEE [id number] 5.4  NEUTROABS  --  3.4  --   HGB 8.0* 7.3*  DUPLICATE REQUEST SEE [id number] 8.3*  HCT 25.2* 22.4*  DUPLICATE REQUEST SEE [id number] 24.7*  MCV 83.4 83.0  DUPLICATE REQUEST SEE [id number] 81.3  PLT 328 257  DUPLICATE REQUEST SEE [id number] 280   Cardiac Enzymes: No results found for this basename: CKTOTAL, CKMB, CKMBINDEX, TROPONINI,  in the last 168 hours BNP: BNP (last 3 results) No results found for this basename: PROBNP,  in the last 8760 hours CBG:  Recent Labs Lab 02/14/13 0719  GLUCAP 104*       Signed:  Jasmeen Fritsch L  [facility] 02/14/2013, 3:03 PM

## 2013-02-14 NOTE — Evaluation (Signed)
Physical Therapy Evaluation Patient Details Name: James Jones MRN: [id number] DOB: 1945/08/05 Today's Date: 02/14/2013 Time: 4098-1191 PT Time Calculation (min): 22 min  PT Assessment / Plan / Recommendation History of Present Illness  68 year old male with past medical history of hypertension, diabetes, dyslipidemia, CAD (on aspirin) who presented to [facility] ED 02/13/2013 with complaints of having ongoing black stools, weakness, fatigue for past month or so. Pt also reported having intermittent palpitations but reports no palpitations at this time. He was apparently recently evaluated by Dr. Madilyn Fireman of [facility] but no definitive diagnostic studies were done. He did have unremarkable EGD and colonoscopy in 2014. No reports of diarrhea. No fever or chills. No chest pain. '  Clinical Impression  Pt admitted with above. Pt currently at his baseline level of physical function.  He reports he doesn't have fatigue at the moment with mobility, but it can come and go without warning.  He ambulated and performed dynamic tasks while ambulating with no difficulty.  Education provided to continue his exercise plan to rehabilitate his rotator cuff surgery while in the hospital until he can return to his outpatient physical therapy sessions.  Education provide to ambulate throughout day with help to prevent muscle atrophy and weakness.     PT Assessment  Patent does not need any further PT services    Follow Up Recommendations  Outpatient PT (Resume outpatient PT for rehab post rotator cuff repair)          Equipment Recommendations  None recommended by PT          Precautions / Restrictions Precautions Precautions: Fall Restrictions Weight Bearing Restrictions: No   Pertinent Vitals/Pain No pain reported.       Mobility    02/14/13 0900  Bed Mobility  Overal bed mobility Modified Independent  Bed Mobility Supine to Sit  Supine to sit Modified independent (Device/Increase time)  General bed  mobility comments Increased time required     02/14/13 0900  Transfers  Overall transfer level Needs assistance  Equipment used None  Transfers Sit to/from Stand  Sit to Stand Supervision  General transfer comment A for safety    02/14/13 0900  Ambulation/Gait  Ambulation/Gait assistance Supervision  Ambulation Distance (Feet) 500 Feet  Assistive device None  Gait velocity normal  Stairs Yes  Stairs assistance Supervision  Stair Management No rails;Forwards;Alternating pattern  Number of Stairs 4  General stair comments A for safety and VC for management        PT Goals(Current goals can be found in the care plan section) Acute Rehab PT Goals Patient Stated Goal: get better PT Goal Formulation: With patient Time For Goal Achievement: 02/28/13 Potential to Achieve Goals: Good  Visit Information  Last PT Received On: 02/14/13 Assistance Needed: +1 History of Present Illness: 68 year old male with past medical history of hypertension, diabetes, dyslipidemia, CAD (on aspirin) who presented to [facility] ED 02/13/2013 with complaints of having ongoing black stools, weakness, fatigue for past month or so. Pt also reported having intermittent palpitations but reports no palpitations at this time. He was apparently recently evaluated by Dr. Madilyn Fireman of [facility] but no definitive diagnostic studies were done. He did have unremarkable EGD and colonoscopy in 2014. No reports of diarrhea. No fever or chills. No chest pain. '       Prior Functioning  Home Living Family/patient expects to be discharged to:: Private residence Living Arrangements: Spouse/significant other Available Help at Discharge: Family Type of Home: House  Home Access: Ramped entrance Home Layout: One level Home Equipment: None Prior Function Level of Independence: Independent Communication Communication: No difficulties    Cognition  Cognition Arousal/Alertness: Awake/alert Behavior During Therapy: WFL for tasks  assessed/performed Overall Cognitive Status: Within Functional Limits for tasks assessed       Balance Dynamic Gait Index Level Surface: Normal Change in Gait Speed: Normal Gait with Horizontal Head Turns: Normal Gait with Vertical Head Turns: Normal Gait and Pivot Turn: Normal Step Over Obstacle: Normal Step Around Obstacles: Normal Steps: Normal Total Score: 24  End of Session PT - End of Session Equipment Utilized During Treatment: Gait belt Activity Tolerance: Patient tolerated treatment well Patient left: in chair;with call bell/phone within reach Nurse Communication: Mobility status  GP    Barrie Dunkerylan Elliott, SPT Pager:  [phone]  Barrie Dunkerlliott, Dylan 02/14/2013, 10:54 AM Read, reviewed, edited and agree with student's findings and recommendations.  Rollene Rotundaebecca B. Aisia Correira, PT, DPT [phone]

## 2013-02-14 NOTE — Progress Notes (Signed)
UR Completed Isaiah Cianci Graves-Bigelow, RN,BSN [phone]  

## 2013-02-15 LAB — TYPE AND SCREEN
ABO/RH(D): A POS
ANTIBODY SCREEN: NEGATIVE
UNIT DIVISION: 0
Unit division: 0

## 2013-02-28 DIAGNOSIS — Z4789 Encounter for other orthopedic aftercare: Secondary | ICD-10-CM | POA: Diagnosis not present

## 2013-02-28 DIAGNOSIS — M25519 Pain in unspecified shoulder: Secondary | ICD-10-CM | POA: Diagnosis not present

## 2013-03-14 DIAGNOSIS — D649 Anemia, unspecified: Secondary | ICD-10-CM | POA: Diagnosis not present

## 2013-03-14 DIAGNOSIS — K921 Melena: Secondary | ICD-10-CM | POA: Diagnosis not present

## 2013-03-14 DIAGNOSIS — K2100 Gastro-esophageal reflux disease with esophagitis, without bleeding: Secondary | ICD-10-CM | POA: Diagnosis not present

## 2013-03-21 DIAGNOSIS — D649 Anemia, unspecified: Secondary | ICD-10-CM | POA: Diagnosis not present

## 2013-03-28 DIAGNOSIS — E119 Type 2 diabetes mellitus without complications: Secondary | ICD-10-CM | POA: Diagnosis not present

## 2013-03-28 DIAGNOSIS — D509 Iron deficiency anemia, unspecified: Secondary | ICD-10-CM | POA: Diagnosis not present

## 2013-03-28 DIAGNOSIS — I10 Essential (primary) hypertension: Secondary | ICD-10-CM | POA: Diagnosis not present

## 2013-03-28 DIAGNOSIS — E785 Hyperlipidemia, unspecified: Secondary | ICD-10-CM | POA: Diagnosis not present

## 2013-04-02 DIAGNOSIS — N182 Chronic kidney disease, stage 2 (mild): Secondary | ICD-10-CM | POA: Diagnosis not present

## 2013-04-02 DIAGNOSIS — Q619 Cystic kidney disease, unspecified: Secondary | ICD-10-CM | POA: Diagnosis not present

## 2013-04-03 DIAGNOSIS — E119 Type 2 diabetes mellitus without complications: Secondary | ICD-10-CM | POA: Diagnosis not present

## 2013-04-03 DIAGNOSIS — E785 Hyperlipidemia, unspecified: Secondary | ICD-10-CM | POA: Diagnosis not present

## 2013-04-03 DIAGNOSIS — D500 Iron deficiency anemia secondary to blood loss (chronic): Secondary | ICD-10-CM | POA: Diagnosis not present

## 2013-04-03 DIAGNOSIS — D509 Iron deficiency anemia, unspecified: Secondary | ICD-10-CM | POA: Diagnosis not present

## 2013-04-03 DIAGNOSIS — I10 Essential (primary) hypertension: Secondary | ICD-10-CM | POA: Diagnosis not present

## 2013-04-10 DIAGNOSIS — M7020 Olecranon bursitis, unspecified elbow: Secondary | ICD-10-CM | POA: Diagnosis not present

## 2013-04-19 DIAGNOSIS — M25539 Pain in unspecified wrist: Secondary | ICD-10-CM | POA: Diagnosis not present

## 2013-04-19 DIAGNOSIS — M7710 Lateral epicondylitis, unspecified elbow: Secondary | ICD-10-CM | POA: Diagnosis not present

## 2013-04-19 DIAGNOSIS — M7020 Olecranon bursitis, unspecified elbow: Secondary | ICD-10-CM | POA: Diagnosis not present

## 2013-04-24 DIAGNOSIS — M25539 Pain in unspecified wrist: Secondary | ICD-10-CM | POA: Diagnosis not present

## 2013-05-07 DIAGNOSIS — D500 Iron deficiency anemia secondary to blood loss (chronic): Secondary | ICD-10-CM | POA: Diagnosis not present

## 2013-05-16 DIAGNOSIS — E162 Hypoglycemia, unspecified: Secondary | ICD-10-CM | POA: Diagnosis not present

## 2013-05-16 DIAGNOSIS — J309 Allergic rhinitis, unspecified: Secondary | ICD-10-CM | POA: Diagnosis not present

## 2013-05-17 DIAGNOSIS — M25539 Pain in unspecified wrist: Secondary | ICD-10-CM | POA: Diagnosis not present

## 2013-06-08 DIAGNOSIS — Z4789 Encounter for other orthopedic aftercare: Secondary | ICD-10-CM | POA: Diagnosis not present

## 2013-07-05 DIAGNOSIS — E119 Type 2 diabetes mellitus without complications: Secondary | ICD-10-CM | POA: Diagnosis not present

## 2013-07-05 DIAGNOSIS — D649 Anemia, unspecified: Secondary | ICD-10-CM | POA: Diagnosis not present

## 2013-07-05 DIAGNOSIS — E785 Hyperlipidemia, unspecified: Secondary | ICD-10-CM | POA: Diagnosis not present

## 2013-07-05 DIAGNOSIS — N189 Chronic kidney disease, unspecified: Secondary | ICD-10-CM | POA: Diagnosis not present

## 2013-07-10 DIAGNOSIS — E119 Type 2 diabetes mellitus without complications: Secondary | ICD-10-CM | POA: Diagnosis not present

## 2013-07-10 DIAGNOSIS — E785 Hyperlipidemia, unspecified: Secondary | ICD-10-CM | POA: Diagnosis not present

## 2013-07-24 DIAGNOSIS — E119 Type 2 diabetes mellitus without complications: Secondary | ICD-10-CM | POA: Diagnosis not present

## 2013-08-13 DIAGNOSIS — D500 Iron deficiency anemia secondary to blood loss (chronic): Secondary | ICD-10-CM | POA: Diagnosis not present

## 2013-08-14 DIAGNOSIS — D500 Iron deficiency anemia secondary to blood loss (chronic): Secondary | ICD-10-CM | POA: Diagnosis not present

## 2013-10-01 DIAGNOSIS — E109 Type 1 diabetes mellitus without complications: Secondary | ICD-10-CM | POA: Diagnosis not present

## 2013-10-01 DIAGNOSIS — I10 Essential (primary) hypertension: Secondary | ICD-10-CM | POA: Diagnosis not present

## 2013-10-04 DIAGNOSIS — M7660 Achilles tendinitis, unspecified leg: Secondary | ICD-10-CM | POA: Diagnosis not present

## 2013-10-04 DIAGNOSIS — M898X9 Other specified disorders of bone, unspecified site: Secondary | ICD-10-CM | POA: Diagnosis not present

## 2013-10-04 DIAGNOSIS — M79609 Pain in unspecified limb: Secondary | ICD-10-CM | POA: Diagnosis not present

## 2013-10-05 DIAGNOSIS — E119 Type 2 diabetes mellitus without complications: Secondary | ICD-10-CM | POA: Diagnosis not present

## 2013-10-05 DIAGNOSIS — H5200 Hypermetropia, unspecified eye: Secondary | ICD-10-CM | POA: Diagnosis not present

## 2013-10-05 DIAGNOSIS — H2510 Age-related nuclear cataract, unspecified eye: Secondary | ICD-10-CM | POA: Diagnosis not present

## 2013-10-05 DIAGNOSIS — H25019 Cortical age-related cataract, unspecified eye: Secondary | ICD-10-CM | POA: Diagnosis not present

## 2013-10-16 ENCOUNTER — Ambulatory Visit (INDEPENDENT_AMBULATORY_CARE_PROVIDER_SITE_OTHER): Payer: Medicare Other | Admitting: Cardiology

## 2013-10-16 VITALS — BP 142/70 | HR 61 | Ht 67.5 in | Wt 221.1 lb

## 2013-10-16 DIAGNOSIS — E785 Hyperlipidemia, unspecified: Secondary | ICD-10-CM | POA: Diagnosis not present

## 2013-10-16 DIAGNOSIS — E119 Type 2 diabetes mellitus without complications: Secondary | ICD-10-CM

## 2013-10-16 DIAGNOSIS — I441 Atrioventricular block, second degree: Secondary | ICD-10-CM | POA: Diagnosis not present

## 2013-10-16 DIAGNOSIS — I10 Essential (primary) hypertension: Secondary | ICD-10-CM | POA: Diagnosis not present

## 2013-10-16 DIAGNOSIS — I4510 Unspecified right bundle-branch block: Secondary | ICD-10-CM

## 2013-10-16 DIAGNOSIS — E669 Obesity, unspecified: Secondary | ICD-10-CM

## 2013-10-16 DIAGNOSIS — I2510 Atherosclerotic heart disease of native coronary artery without angina pectoris: Secondary | ICD-10-CM

## 2013-10-16 NOTE — Patient Instructions (Signed)
NO CHANGE IN MEDICATION  Your physician wants you to follow-up in 12 MONTH DR HARDNG. You will receive a reminder letter in the mail two months in advance. If you don't receive a letter, please call our office to schedule the follow-up appointment.

## 2013-10-17 ENCOUNTER — Encounter: Payer: Self-pay | Admitting: Cardiology

## 2013-10-17 NOTE — Assessment & Plan Note (Signed)
No active anginal symptoms. Currently not on aspirin after GI bleed. Is on stable dose of statin and ARB. Not on beta blocker due to baseline bradycardia. Continue to monitor.

## 2013-10-17 NOTE — Assessment & Plan Note (Signed)
No beta blocker. Likely related to OSA. He seems to be tolerating his CPAP better now.

## 2013-10-17 NOTE — Assessment & Plan Note (Signed)
Continues to be on Crestor. Labs are being monitored by PCP. Was told that they were well controlled as was his diabetes control. Currently not on diabetes medications.

## 2013-10-17 NOTE — Assessment & Plan Note (Signed)
Continue discussed the importance of dietary modifications and exercise. We discussed his dietary habits. Some of the foods that lead to heart burn are foods he should not be eating anyway.

## 2013-10-17 NOTE — Assessment & Plan Note (Signed)
Seems to be well-controlled. No longer on medications.

## 2013-10-17 NOTE — Assessment & Plan Note (Signed)
Borderline controlled today on current regimen. Next dose of ARB /HCTZ. Continue to follow. Would consider additional therapy if remains consistently greater than 150s systolic.

## 2013-10-17 NOTE — Assessment & Plan Note (Signed)
Stable now. 

## 2013-10-17 NOTE — Progress Notes (Signed)
PCP: Herb Grays, MD  Clinic Note: Chief Complaint  Patient presents with  . Annual Exam    no chest pain , no edema no sob    HPI: James Jones is a 68 y.o. male with a Cardiovascular Problem List below who presents today for annual followup of moderate/nonobstructive CAD. He was last seen 10/15/2012. He was stable at that point were no major complaints. He has had no interval studies.1 Main issue is that in January timeframe he required one unit of blood transfusion. No culprit was identified. He was told aspirin. He was also told to stop Mobic.  Interval History: He is doing well. He still walks every other day about 3-4 miles. The only real symptom he notes his intermittent heartburn. He has been taking Zantac, which helps him some but not really completely. He denies any chest tightness / pressure or dyspnea with rest or exertion. No further GI bleed issues with no known hematochezia, hematuria or epistaxis. No PND, orthopnea with mild edema the usually goes away with his legs up. He was doing really well having lost some weight, but then came back up to ~220 pounds. He has lost several pounds back but not back to his baseline and not able to get negative. He denies any rapid or irregular heartbeat/rhythm, syncope/near syncope or TIA/amaurosis fugax. No claudication.  Past Medical History  Diagnosis Date  . Diabetes mellitus without complication   . Hypertension   . Hyperlipidemia   . 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 OSA (obstructive sleep apnea) 03/18/2012    Doing better with CPAP  . 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 Pulmonary hypertension 12/10/2010    ECHO:  Mild PH,mild LVH; PA pressures estimated 30-40 mmHg  . 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 History of GI bleed January 2015    No obvious findings on EGD/colonoscopy    Prior Cardiac Evaluation and Past Surgical History: Past Surgical History  Procedure Laterality Date  . Shoulder arthroscopy  2006  . Appendectomy  1974  . Carpal tunnel release    . Hemorrhoid surgery    . Eye surgery  1960    Left eye  . Cardiac catheterization  03/27/2010    Moderate mid RCA lesion,right radial approach,normal EF   MEDICATIONS AND ALLERGIES REVIEWED IN EPIC No Change in Social and Family History  ROS: A comprehensive Review of Systems - was performed Review of Systems  Constitutional: Negative.  Negative for fever, chills, malaise/fatigue and diaphoresis.  HENT: Negative for nosebleeds.   Eyes: Positive for double vision.       Chronic left eye- lazy  Gastrointestinal: Positive for heartburn. Negative for constipation, blood in stool and melena.  Genitourinary: Negative for frequency and hematuria.  Musculoskeletal: Positive for back pain and joint pain. Negative for myalgias.  Neurological: Negative for dizziness, sensory change, speech change, focal weakness, seizures, loss of consciousness, weakness and headaches.  Endo/Heme/Allergies: Does not bruise/bleed easily.  Psychiatric/Behavioral: Negative.  Negative for depression and hallucinations.  All other systems reviewed and are negative.  Wt Readings from Last 3 Encounters:  10/16/13 221 lb 1.6 oz (100.29 kg)  02/14/13 227 lb 4.8 oz (103.103 kg)  10/02/12 214 lb 8 oz (97.297 kg)  PHYSICAL EXAM BP 142/70  Pulse 61  Ht 5' 7.5" (1.715 m)  Wt 221 lb 1.6 oz (100.29 kg)  BMI 34.10 kg/m2 General appearance: alert, cooperative, appears stated age, no distress, mildly obese and Otherwise healthy-appearing. Well-nourished and well-groomed. Answers questions appropriately.  Neck: no adenopathy, no carotid bruit, no JVD, supple, symmetrical, trachea midline and thyroid not enlarged, symmetric, no  tenderness/mass/nodules  Lungs: clear to auscultation bilaterally, normal percussion bilaterally and Nonlabored, and good air movement.  Heart: regular rate and rhythm, S1, S2 normal, no murmur, click, rub or gallop and normal apical impulse  Abdomen: soft, non-tender; bowel sounds normal; no masses, no organomegaly  Extremities: extremities normal, atraumatic, no cyanosis or edema  Pulses: 2+ and symmetric  Neurologic: Grossly normal   Adult ECG Report  Rate: 61 ;  Rhythm: normal sinus rhythm and RBBB  Narrative Interpretation:  Stable EKG  Recent Labs  none since January:   ASSESSMENT / PLAN: CAD (coronary artery disease), with 60-70% stenosis in RCA No active anginal symptoms. Currently not on aspirin after GI bleed. Is on stable dose of statin and ARB. Not on beta blocker due to baseline bradycardia. Continue to monitor.  Essential hypertension Borderline controlled today on current regimen. Next dose of ARB /HCTZ. Continue to follow. Would consider additional therapy if remains consistently greater than 150s systolic.  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 Likely related to OSA. He seems to be tolerating his CPAP better now.  Dyslipidemia, goal LDL below 70 Continues to be on Crestor. Labs are being monitored by PCP. Was told that they were well controlled as was his diabetes control. Currently not on diabetes medications.  RBBB  Stable now.  DM (diabetes mellitus) Seems to be well-controlled. No longer on medications.  Obesity (BMI 30-39.9); 32.6 on 10/02/2012 Continue discussed the importance of dietary modifications and exercise. We discussed his dietary habits. Some of the foods that lead to heart burn are foods he should not be eating anyway.    Orders Placed This Encounter  Procedures  . EKG 12-Lead   Overall stable. Annual followup is warranted.  Followup:  12 months  DAVID W. Herbie Baltimore, M.D., M.S. Interventional  Cardiologist [facility]

## 2013-12-11 DIAGNOSIS — M25562 Pain in left knee: Secondary | ICD-10-CM | POA: Diagnosis not present

## 2013-12-12 DIAGNOSIS — M25562 Pain in left knee: Secondary | ICD-10-CM | POA: Diagnosis not present

## 2013-12-12 DIAGNOSIS — M25662 Stiffness of left knee, not elsewhere classified: Secondary | ICD-10-CM | POA: Diagnosis not present

## 2013-12-12 DIAGNOSIS — M25462 Effusion, left knee: Secondary | ICD-10-CM | POA: Diagnosis not present

## 2013-12-21 DIAGNOSIS — M25462 Effusion, left knee: Secondary | ICD-10-CM | POA: Diagnosis not present

## 2013-12-21 DIAGNOSIS — M25562 Pain in left knee: Secondary | ICD-10-CM | POA: Diagnosis not present

## 2014-01-06 DIAGNOSIS — Z23 Encounter for immunization: Secondary | ICD-10-CM | POA: Diagnosis not present

## 2014-01-08 DIAGNOSIS — D509 Iron deficiency anemia, unspecified: Secondary | ICD-10-CM | POA: Diagnosis not present

## 2014-01-08 DIAGNOSIS — E119 Type 2 diabetes mellitus without complications: Secondary | ICD-10-CM | POA: Diagnosis not present

## 2014-01-08 DIAGNOSIS — N289 Disorder of kidney and ureter, unspecified: Secondary | ICD-10-CM | POA: Diagnosis not present

## 2014-01-08 DIAGNOSIS — I10 Essential (primary) hypertension: Secondary | ICD-10-CM | POA: Diagnosis not present

## 2014-01-08 IMAGING — CT CT ABD-PELV W/ CM
3 of 4 series · 13 of 36 positions shown, 19 images · IV contrast (READICAT/WATER & 75CC OMNI 300)
Comparison: 03/30/2011

CLINICAL DATA: Left abdominal pain

CT ABDOMEN AND PELVIS WITH CONTRAST
TECHNIQUE: Multidetector CT imaging of the abdomen and pelvis was
performed following the standard protocol during bolus
administration of intravenous contrast.
Contrast: 75mL OMNIPAQUE IOHEXOL 300 MG/ML  SOLN

[Series 3: abd/pelvis with · axial · 0.72mm/px · z∈[-400,-60]mm · 8 of 88 slices shown, 13 images]
[im 10/88  soft-tissue]
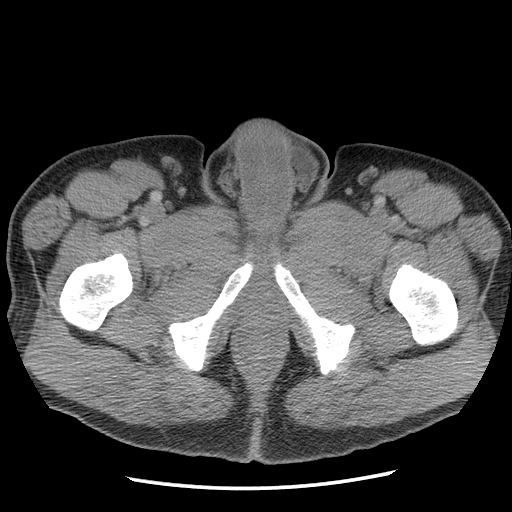
[im 10/88  bone]
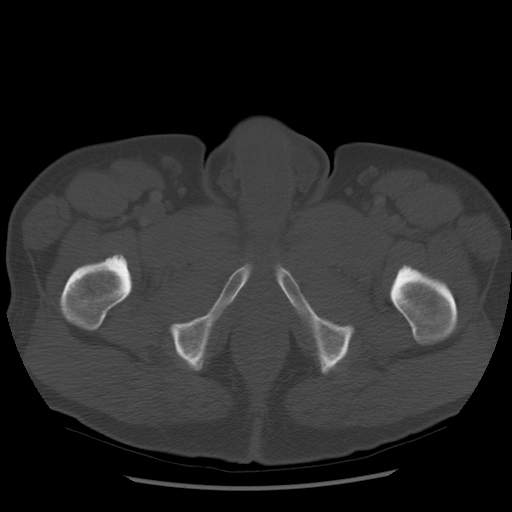
[im 20/88  soft-tissue]
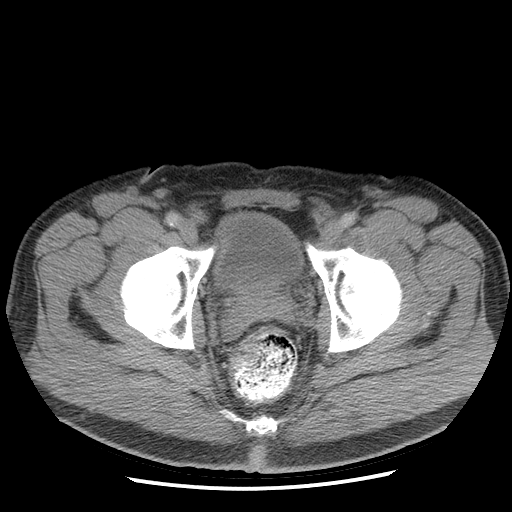
[im 30/88  soft-tissue]
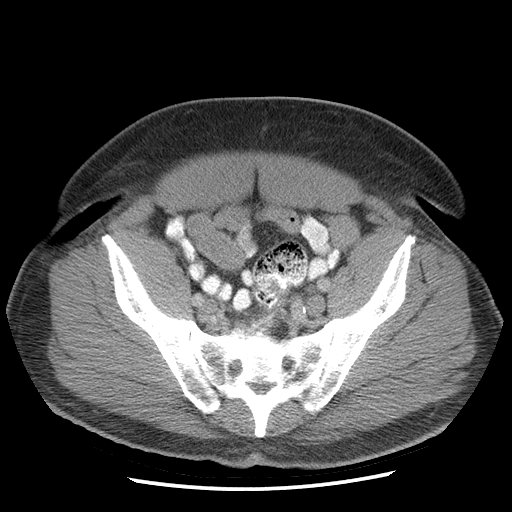
[im 39/88  soft-tissue]
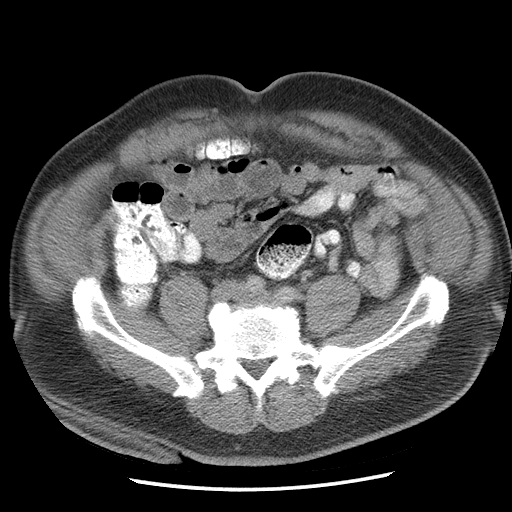
[im 49/88  soft-tissue]
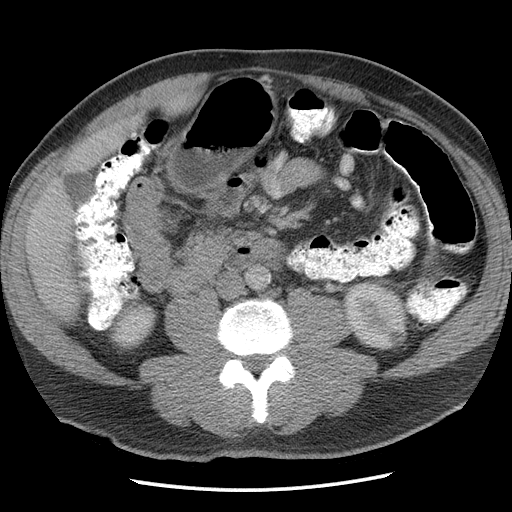
[im 49/88  lung]
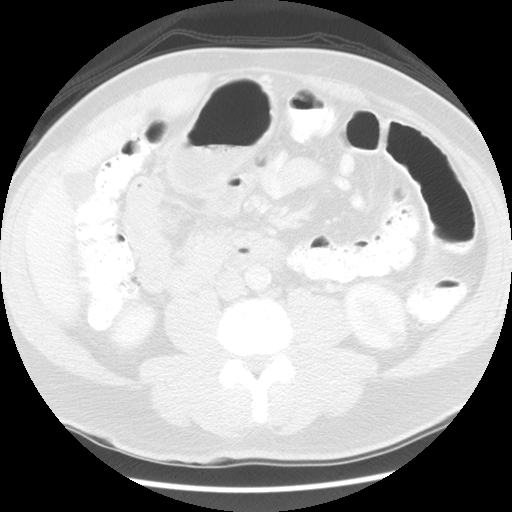
[im 59/88  soft-tissue]
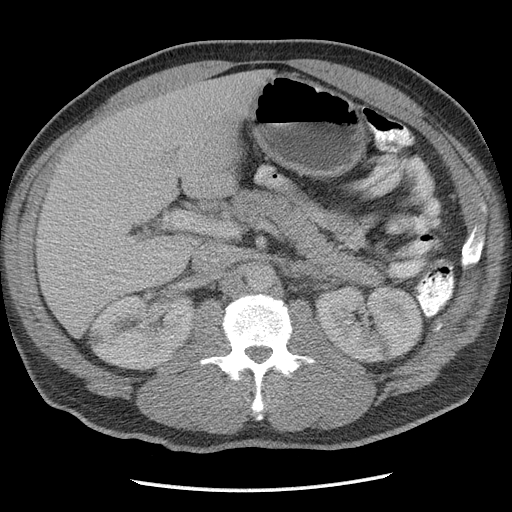
[im 59/88  lung]
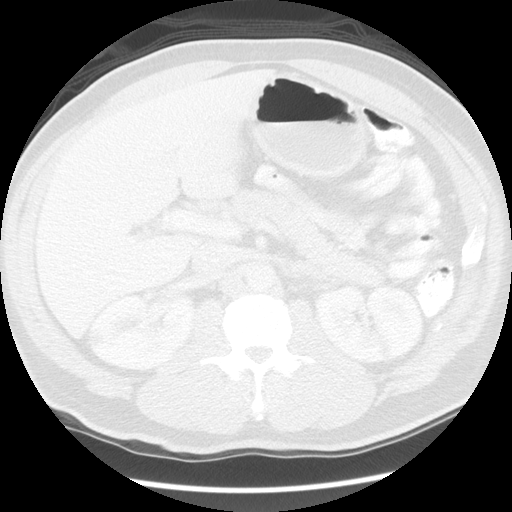
[im 68/88  soft-tissue]
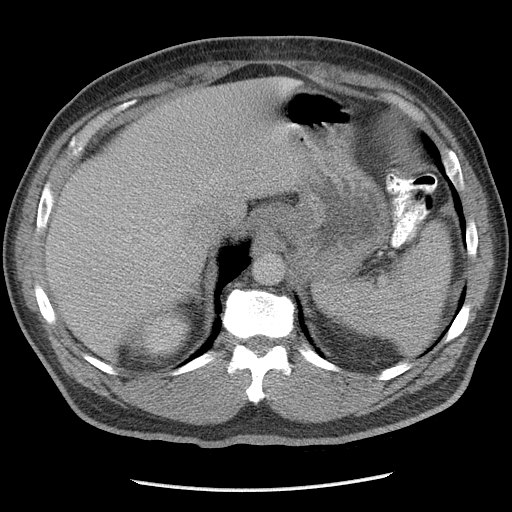
[im 68/88  lung]
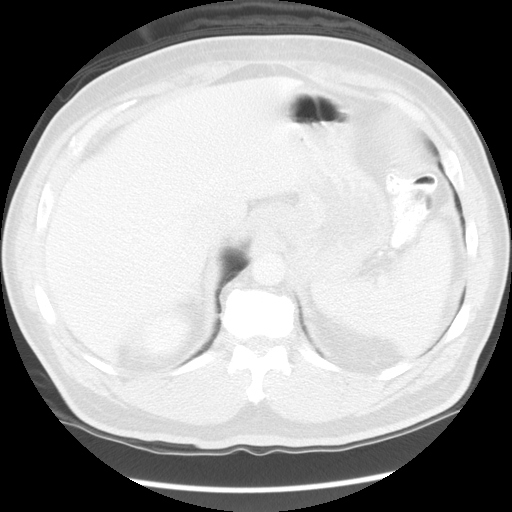
[im 78/88  soft-tissue]
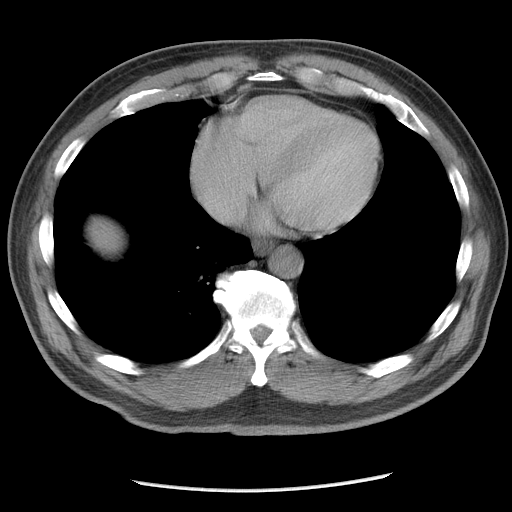
[im 78/88  lung]
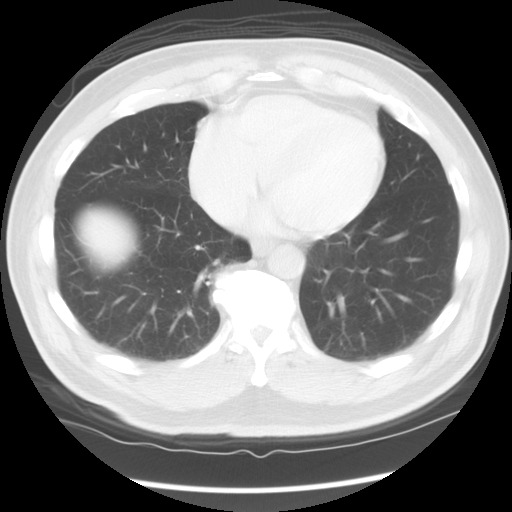

[Series 601: coronal body · coronal · 0.83mm/px · 1 of 130 slices shown, 2 images]
[im 44/130  soft-tissue]
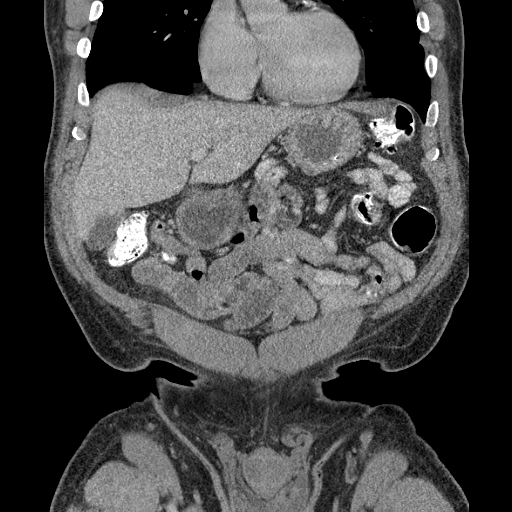
[im 44/130  bone]
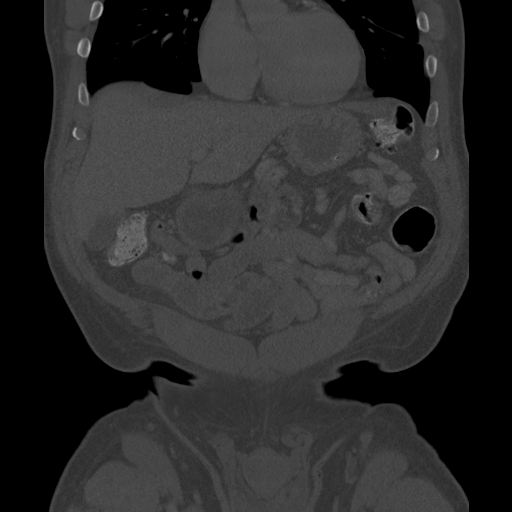

[Series 602: sagittal body · sagittal · 0.83mm/px · 4 of 149 slices shown]
[im 10/149  soft-tissue]
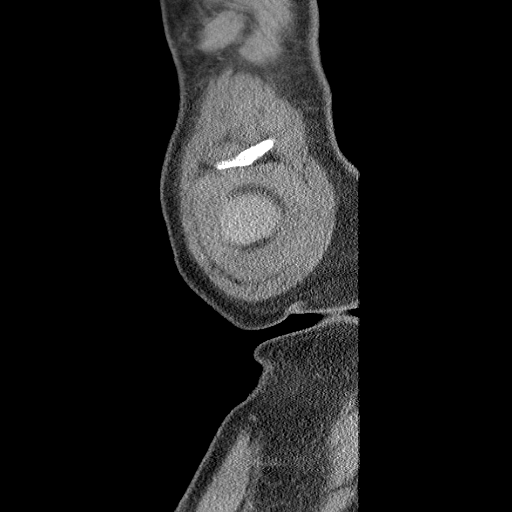
[im 28/149  soft-tissue]
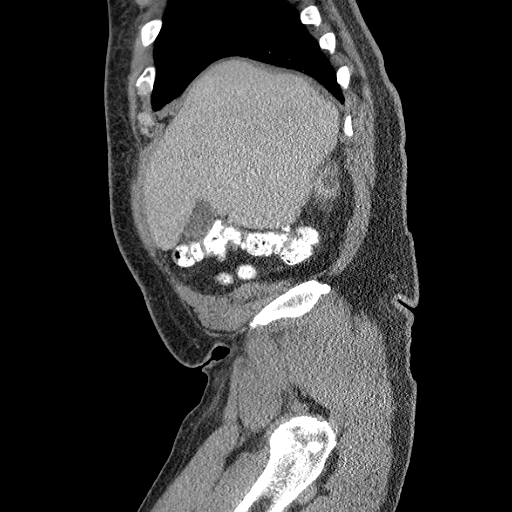
[im 47/149  soft-tissue]
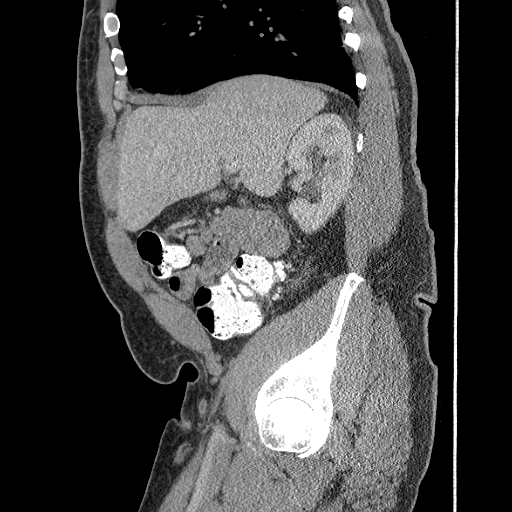
[im 65/149  soft-tissue]
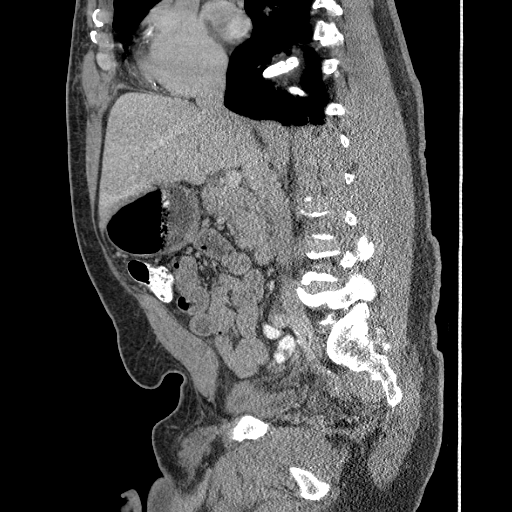

[13 of 36 positions shown; findings below may reference images not displayed]

FINDINGS: The lung bases appear clear.

No pericardial or pleural effusion.

There is no focal liver abnormality.

There is a small stone within the gallbladder.

No secondary signs of acute cholecystitis.

The pancreas is normal.

There is no intra or extrahepatic biliary ductal dilatation.

The spleen appears normal.

Both adrenal glands are normal.

Bilateral renal cysts are again identified.  Previously described
Bosniak type 2 cyst arising from the upper pole of the left kidney
measures 1.9 cm, image 27.  This is stable from previous exam.
Simple appearing cysts are identified within the remaining portions
of the kidneys.

There is no upper abdominal adenopathy.

There is no pelvic or inguinal adenopathy.

The patient is noted to have bilateral hydroceles.

The urinary bladder is normal.

The stomach and the small bowel loops are normal.

Normal appearance of the colon.  There is no free fluid or abnormal
fluid collection within the abdomen or pelvis.

The there is mild multilevel level lumbar spondylosis.
IMPRESSION: 1.  Stable size of septated, Bosniak category type 2 cyst of the
left kidney upper pole.

2.  No acute findings within the abdomen or pelvis.

3.  Bilateral hydroceles

## 2014-01-10 DIAGNOSIS — E119 Type 2 diabetes mellitus without complications: Secondary | ICD-10-CM | POA: Diagnosis not present

## 2014-01-17 ENCOUNTER — Encounter (HOSPITAL_COMMUNITY): Payer: Self-pay | Admitting: Cardiology

## 2014-03-04 DIAGNOSIS — H6691 Otitis media, unspecified, right ear: Secondary | ICD-10-CM | POA: Diagnosis not present

## 2014-03-04 DIAGNOSIS — H6123 Impacted cerumen, bilateral: Secondary | ICD-10-CM | POA: Diagnosis not present

## 2014-03-04 DIAGNOSIS — H6091 Unspecified otitis externa, right ear: Secondary | ICD-10-CM | POA: Diagnosis not present

## 2014-03-22 DIAGNOSIS — H8301 Labyrinthitis, right ear: Secondary | ICD-10-CM | POA: Diagnosis not present

## 2014-03-22 DIAGNOSIS — H9201 Otalgia, right ear: Secondary | ICD-10-CM | POA: Diagnosis not present

## 2014-04-11 DIAGNOSIS — E119 Type 2 diabetes mellitus without complications: Secondary | ICD-10-CM | POA: Diagnosis not present

## 2014-04-11 DIAGNOSIS — D509 Iron deficiency anemia, unspecified: Secondary | ICD-10-CM | POA: Diagnosis not present

## 2014-04-11 DIAGNOSIS — N289 Disorder of kidney and ureter, unspecified: Secondary | ICD-10-CM | POA: Diagnosis not present

## 2014-04-11 DIAGNOSIS — I10 Essential (primary) hypertension: Secondary | ICD-10-CM | POA: Diagnosis not present

## 2014-04-11 DIAGNOSIS — E785 Hyperlipidemia, unspecified: Secondary | ICD-10-CM | POA: Diagnosis not present

## 2014-04-29 ENCOUNTER — Ambulatory Visit (INDEPENDENT_AMBULATORY_CARE_PROVIDER_SITE_OTHER): Payer: Medicare Other | Admitting: Internal Medicine

## 2014-04-29 ENCOUNTER — Encounter: Payer: Self-pay | Admitting: Internal Medicine

## 2014-04-29 VITALS — BP 118/60 | HR 78 | Temp 97.9°F | Ht 67.75 in | Wt 220.8 lb

## 2014-04-29 DIAGNOSIS — E1159 Type 2 diabetes mellitus with other circulatory complications: Secondary | ICD-10-CM | POA: Diagnosis not present

## 2014-04-29 MED ORDER — METFORMIN HCL 500 MG PO TABS: 1000.0000 mg | ORAL_TABLET | Freq: Two times a day (BID) | ORAL | Status: DC

## 2014-04-29 MED ORDER — GLIPIZIDE ER 5 MG PO TB24: 5.0000 mg | ORAL_TABLET | Freq: Every day | ORAL | Status: DC

## 2014-04-29 NOTE — Progress Notes (Signed)
Patient ID: James CanalesJames Tarazon, male   DOB: 08-03-1945, 69 y.o.   MRN: [id number]  HPI: James Jones is a 69 y.o.-year-old male, referred by his PCP, Dr. Shanon PayorStewart Cooper - [facility], for management of DM2, dx in ~2005, non-insulin-dependent, uncontrolled, with complications (heart ds.).  Last hemoglobin A1c was: 04/11/2014: HbA1c 8.4% 01/08/2014: HbA1c 7.7% Lab Results  Component Value Date   HGBA1C 6.1* 03/17/2012  He had steroid inj for gout in his elbows - 12/2013. He also had a steroid inj in knee last fall, too.   Pt is on a regimen of: - Glipizide ER 5 mg daily He was on Actos. He also tried Metformin in the past >> unclear why.  He was on insulin before (Lantus) - came off years ago.  Pt checks his sugars 2x a day and they are higher in last 2-3 weeks: - am: lately 150-170 - 2h after b'fast: 90-120 - before lunch: n/c - 2h after lunch: n/c - before dinner: n/c - 2h after dinner: 118-140 - bedtime: n/c - nighttime: n/c + lows - 3-4 pm: 40-60. Lowest 47. No lows recenty; he has hypoglycemia awareness at 70.  Highest sugar was 200x1.  Glucometer: Freestyle  Pt's meals are: - Breakfast: bacon + eggs, sausage, grits, oatmeal, cereal, toast wheat - Lunch: BLT, soups, fruit, nuts - Dinner: chicken, pork + greens, rice  - Snacks: 1 a day: pretzels; carrots  Walks 3x a day.  - no CKD, last BUN/creatinine:  01/08/2014: 18/1.15 Lab Results  Component Value Date   BUN 16 02/14/2013   CREATININE 1.13 02/14/2013  On Valsartan. - last set of lipids: 01/08/2014: 161/98/60/73 Lab Results  Component Value Date   CHOL 182 03/18/2012   HDL 53 03/18/2012   LDLCALC 90 03/18/2012   TRIG 193* 03/18/2012   CHOLHDL 3.4 03/18/2012  He is on Crestor 40 mg daily. - last eye exam was in summer 2015. No DR.  - no numbness and tingling in his feet.  Pt has FH of DM in son and daughter, brother.  ROS: Constitutional: no weight gain/loss, + fatigue, no subjective  hyperthermia/hypothermia, + nocturia Eyes: no blurry vision, no xerophthalmia ENT: no sore throat, no nodules palpated in throat, no dysphagia/odynophagia, no hoarseness Cardiovascular: no CP/SOB/palpitations/leg swelling Respiratory: no cough/SOB Gastrointestinal: no N/V/D/C/+ heartburn Musculoskeletal: no muscle/joint aches Skin: no rashes Neurological: no tremors/numbness/tingling/dizziness, + HA Psychiatric: no depression/anxiety + low libido, + diff with erections  Past Medical History  Diagnosis Date  . Diabetes mellitus without complication   . Hypertension   . Hyperlipidemia   . 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 OSA (obstructive sleep apnea) 03/18/2012    Doing better with CPAP  . 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 Pulmonary hypertension 12/10/2010    ECHO:  Mild PH,mild LVH; PA pressures estimated 30-40 mmHg  . 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 History of GI bleed January 2015    No obvious findings on EGD/colonoscopy   Past Surgical History  Procedure Laterality Date  . Shoulder arthroscopy  2006  . Appendectomy  1974  . Carpal tunnel release    . Hemorrhoid surgery    . Eye surgery  1960    Left eye  . Cardiac catheterization  03/27/2010    Moderate mid RCA lesion,right radial approach,normal EF  . Left heart catheterization with coronary angiogram N/A 03/17/2012    Procedure: LEFT HEART CATHETERIZATION WITH CORONARY ANGIOGRAM;  Surgeon: Marykay Lex, MD;  Location[facility] CATH LAB;  Service: Cardiovascular;  Laterality: N/A;   History   Social History  . Marital Status: Married    Spouse Name: N/A   Occupational History  . retired   Social History Main Topics  . Smoking status: Former Smoker -- 2.00 packs/day     Quit date: 04/12/2012  . Smokeless tobacco: Never Used  . Alcohol Use: 0.5 oz/week    1 drink(s) per week  . Drug Use: Yes    Special: Marijuana     Comment: cannibus   Social History Narrative   Married, father of 2, grandfather of 3.   He is a former smoker of about a pack to pack and half cigarettes a day -- he quit in March of this year.    He is an avid exerciser working at least 4-5 days a week doing her walking or stationary bike.   Current Outpatient Prescriptions on File Prior to Visit  Medication Sig Dispense Refill  . Cholecalciferol (VITAMIN D-3) 1000 UNITS CAPS Take by mouth daily. 2 capsules.    . ferrous sulfate 325 (65 FE) MG tablet Take 1 tablet (325 mg total) by mouth 2 (two) times daily with a meal.  3  . fish oil-omega-3 fatty acids 1000 MG capsule Take 1 g by mouth daily.    Marland Kitchen glucose blood (FREESTYLE TEST STRIPS) test strip     . Multiple Vitamin (MULTIVITAMIN WITH MINERALS) TABS Take 1 tablet by mouth daily.    Marland Kitchen PRESCRIPTION MEDICATION Uses C-PAP at bedtime    . ranitidine (ZANTAC) 300 MG tablet Take 300 mg by mouth at bedtime.    . rosuvastatin (CRESTOR) 40 MG tablet Take 40 mg by mouth daily.    . valsartan-hydrochlorothiazide (DIOVAN-HCT) 320-25 MG per tablet Take 1 tablet by mouth daily.     No current facility-administered medications on file prior to visit.   No Known Allergies Family History  Problem Relation Age of Onset  . Coronary artery disease Father   . Kidney failure Mother   . Kidney failure Sister    PE: BP 118/60 mmHg  Pulse 78  Temp(Src) 97.9 F (36.6 C) (Oral)  Ht 5' 7.75" (1.721 m)  Wt 220 lb 12.8 oz (100.154 kg)  BMI 33.81 kg/m2 Wt Readings from Last 3 Encounters:  04/29/14 220 lb 12.8 oz (100.154 kg)  10/16/13 221 lb 1.6 oz (100.29 kg)  02/14/13 227 lb 4.8 oz (103.103 kg)   Constitutional: overweight, in NAD Eyes: PERRLA, EOMI, no exophthalmos ENT: moist mucous membranes, no thyromegaly, no cervical  lymphadenopathy Cardiovascular: RRR, No MRG Respiratory: CTA B Gastrointestinal: abdomen soft, NT, ND, BS+ Musculoskeletal: no deformities, strength intact in all 4 Skin: moist, warm, no rashes Neurological: no tremor with outstretched hands, DTR normal in all 4  ASSESSMENT: 1. DM2, non-insulin-dependent, uncontrolled, with complications - heart ds - ?   PLAN:  1. Patient with long-standing, uncontrolled diabetes, on oral antidiabetic regimen, which became insufficient. He had low CBGs when he missed meals in the past, not lately. He has mild CKD, unclear why his Metformin was stopped in the past >> we can restart >> this will improve am sugars. For now, continue Glipizide, advised not to miss meals -  I suggested to:  Patient Instructions  Please  continue Glipizide XL 5 mg in am for now.  Please start Metformin 500 mg with dinner x 4 days. If you tolerate this well, add another Metformin tablet (500 mg) with breakfast x 4 days. If you tolerate this well, add another metformin tablet with dinner (total 1000 mg) x 4 days. If you tolerate this well, add another metformin tablet with breakfast (total 1000 mg). Continue with 1000 mg of metformin 2x a day with breakfast and dinner.  Please let me know if the sugars are consistently <80 or >200.  Please return in 1.5 months with your sugar log.   - Strongly advised him to start checking sugars at different times of the day - check 2 times a day, rotating checks - given sugar log and advised how to fill it and to bring it at next appt  - given foot care handout and explained the principles  - given instructions for hypoglycemia management "15-15 rule"  - advised for yearly eye exams >> he is UTD - Return to clinic in 1.5 mo with sugar log

## 2014-04-29 NOTE — Patient Instructions (Signed)
Please continue Glipizide XL 5 mg in am for now.  Please start Metformin 500 mg with dinner x 4 days. If you tolerate this well, add another Metformin tablet (500 mg) with breakfast x 4 days. If you tolerate this well, add another metformin tablet with dinner (total 1000 mg) x 4 days. If you tolerate this well, add another metformin tablet with breakfast (total 1000 mg). Continue with 1000 mg of metformin 2x a day with breakfast and dinner.  Please let me know if the sugars are consistently <80 or >200.  Please return in 1.5 months with your sugar log.   PATIENT INSTRUCTIONS FOR TYPE 2 DIABETES:  **Please join MyChart!** - see attached instructions about how to join if you have not done so already.  DIET AND EXERCISE Diet and exercise is an important part of diabetic treatment.  We recommended aerobic exercise in the form of brisk walking (working between 40-60% of maximal aerobic capacity, similar to brisk walking) for 150 minutes per week (such as 30 minutes five days per week) along with 3 times per week performing 'resistance' training (using various gauge rubber tubes with handles) 5-10 exercises involving the major muscle groups (upper body, lower body and core) performing 10-15 repetitions (or near fatigue) each exercise. Start at half the above goal but build slowly to reach the above goals. If limited by weight, joint pain, or disability, we recommend daily walking in a swimming pool with water up to waist to reduce pressure from joints while allow for adequate exercise.    BLOOD GLUCOSES Monitoring your blood glucoses is important for continued management of your diabetes. Please check your blood glucoses 2-4 times a day: fasting, before meals and at bedtime (you can rotate these measurements - e.g. one day check before the 3 meals, the next day check before 2 of the meals and before bedtime, etc.).   HYPOGLYCEMIA (low blood sugar) Hypoglycemia is usually a reaction to not eating,  exercising, or taking too much insulin/ other diabetes drugs.  Symptoms include tremors, sweating, hunger, confusion, headache, etc. Treat IMMEDIATELY with 15 grams of Carbs: . 4 glucose tablets .  cup regular juice/soda . 2 tablespoons raisins . 4 teaspoons sugar . 1 tablespoon honey Recheck blood glucose in 15 mins and repeat above if still symptomatic/blood glucose <100.  RECOMMENDATIONS TO REDUCE YOUR RISK OF DIABETIC COMPLICATIONS: * Take your prescribed MEDICATION(S) * Follow a DIABETIC diet: Complex carbs, fiber rich foods, (monounsaturated and polyunsaturated) fats * AVOID saturated/trans fats, high fat foods, >2,300 mg salt per day. * EXERCISE at least 5 times a week for 30 minutes or preferably daily.  * DO NOT SMOKE OR DRINK more than 1 drink a day. * Check your FEET every day. Do not wear tightfitting shoes. Contact us if you develop an ulcer * See your EYE doctor once a year or more if needed * Get a FLU shot once a year * Get a PNEUMONIA vaccine once before and once after age 29 years  GOALS:  * Your Hemoglobin A1c of <7%  * fasting sugars need to be <130 * after meals sugars need to be <180 (2h after you start eating) * Your Systolic BP should be 140 or lower  * Your Diastolic BP should be 80 or lower  * Your HDL (Good Cholesterol) should be 40 or higher  * Your LDL (Bad Cholesterol) should be 100 or lower. * Your Triglycerides should be 150 or lower  * Your Urine microalbumin (kidney function)  should be <30 * Your Body Mass Index should be 25 or lower    Please consider the following ways to cut down carbs and fat and increase fiber and micronutrients in your diet: - substitute whole grain for white bread or pasta - substitute brown rice for white rice - substitute 90-calorie flat bread pieces for slices of bread when possible - substitute sweet potatoes or yams for white potatoes - substitute humus for margarine - substitute tofu for cheese when possible -  substitute almond or rice milk for regular milk (would not drink soy milk daily due to concern for soy estrogen influence on breast cancer risk) - substitute dark chocolate for other sweets when possible - substitute water - can add lemon or orange slices for taste - for diet sodas (artificial sweeteners will trick your body that you can eat sweets without getting calories and will lead you to overeating and weight gain in the long run) - do not skip breakfast or other meals (this will slow down the metabolism and will result in more weight gain over time)  - can try smoothies made from fruit and almond/rice milk in am instead of regular breakfast - can also try old-fashioned (not instant) oatmeal made with almond/rice milk in am - order the dressing on the side when eating salad at a restaurant (pour less than half of the dressing on the salad) - eat as little meat as possible - can try juicing, but should not forget that juicing will get rid of the fiber, so would alternate with eating raw veg./fruits or drinking smoothies - use as little oil as possible, even when using olive oil - can dress a salad with a mix of balsamic vinegar and lemon juice, for e.g. - use agave nectar, stevia sugar, or regular sugar rather than artificial sweateners - steam or broil/roast veggies  - snack on veggies/fruit/nuts (unsalted, preferably) when possible, rather than processed foods - reduce or eliminate aspartame in diet (it is in diet sodas, chewing gum, etc) Read the labels!  Try to read Dr. Katherina RightNeal Barnard's book: "Program for Reversing Diabetes" for other ideas for healthy eating.

## 2014-05-07 ENCOUNTER — Other Ambulatory Visit: Payer: Self-pay | Admitting: Internal Medicine

## 2014-05-07 ENCOUNTER — Encounter: Payer: Self-pay | Admitting: Internal Medicine

## 2014-05-07 DIAGNOSIS — E1159 Type 2 diabetes mellitus with other circulatory complications: Secondary | ICD-10-CM

## 2014-05-07 MED ORDER — METFORMIN HCL ER 500 MG PO TB24: 1000.0000 mg | ORAL_TABLET | Freq: Two times a day (BID) | ORAL | Status: DC

## 2014-05-24 DIAGNOSIS — D509 Iron deficiency anemia, unspecified: Secondary | ICD-10-CM | POA: Diagnosis not present

## 2014-05-24 DIAGNOSIS — R195 Other fecal abnormalities: Secondary | ICD-10-CM | POA: Diagnosis not present

## 2014-05-24 DIAGNOSIS — E119 Type 2 diabetes mellitus without complications: Secondary | ICD-10-CM | POA: Diagnosis not present

## 2014-05-24 DIAGNOSIS — J302 Other seasonal allergic rhinitis: Secondary | ICD-10-CM | POA: Diagnosis not present

## 2014-05-24 DIAGNOSIS — R197 Diarrhea, unspecified: Secondary | ICD-10-CM | POA: Diagnosis not present

## 2014-05-24 DIAGNOSIS — R5383 Other fatigue: Secondary | ICD-10-CM | POA: Diagnosis not present

## 2014-05-24 DIAGNOSIS — I10 Essential (primary) hypertension: Secondary | ICD-10-CM | POA: Diagnosis not present

## 2014-05-24 DIAGNOSIS — N289 Disorder of kidney and ureter, unspecified: Secondary | ICD-10-CM | POA: Diagnosis not present

## 2014-05-27 DIAGNOSIS — K921 Melena: Secondary | ICD-10-CM | POA: Diagnosis not present

## 2014-05-28 DIAGNOSIS — R05 Cough: Secondary | ICD-10-CM | POA: Diagnosis not present

## 2014-05-28 DIAGNOSIS — J984 Other disorders of lung: Secondary | ICD-10-CM | POA: Diagnosis not present

## 2014-06-10 ENCOUNTER — Ambulatory Visit (INDEPENDENT_AMBULATORY_CARE_PROVIDER_SITE_OTHER): Payer: Medicare Other | Admitting: Internal Medicine

## 2014-06-10 ENCOUNTER — Encounter: Payer: Self-pay | Admitting: Internal Medicine

## 2014-06-10 VITALS — BP 116/64 | HR 83 | Temp 98.0°F | Resp 12 | Wt 208.0 lb

## 2014-06-10 DIAGNOSIS — E1159 Type 2 diabetes mellitus with other circulatory complications: Secondary | ICD-10-CM

## 2014-06-10 NOTE — Patient Instructions (Signed)
Please stop the regular Metformin x 2 days. Tomorrow night, take 500 mg Metformin ER with dinner. In few days, if no nausea, add another tablet with breakfast. Stay on 500 mg Metformin ER 2x a day.   Continue Glipizide XL 5 mg in am, but move it before breakfast.  Please return in 1 month with your sugar log.

## 2014-06-10 NOTE — Progress Notes (Signed)
Patient ID: James Jones, male   DOB: Oct 14, 1945, 69 y.o.   MRN: [id number]  HPI: James Jones is a 69 y.o.-year-old male, returning for f/u for DM2, dx in ~2005, non-insulin-dependent, uncontrolled, with complications (heart ds., mild CKD). Last visit 1.5 mo ago. PCP:Dr. Shanon Payor; PA [facility]  Last hemoglobin A1c was: 04/11/2014: HbA1c 8.4% 01/08/2014: HbA1c 7.7% Lab Results  Component Value Date   HGBA1C 6.1* 03/17/2012  He had steroid inj for gout in his elbows - 12/2013. He also had a steroid inj in knee last fall, too.   Pt is on a regimen of: - Glipizide ER 5 mg daily - Metformin 500 mg 2x a day - started 04/2014 >> nausea, vomiting, loss of appetite. He did not start the Mteformin ER instead, as suggested as his wife researched it online and found out that the regular metformin is better than the ER Metformin... He was on Actos. He also tried Metformin in the past >> unclear why.  He was on insulin before (Lantus) - came off years ago.  Pt checks his sugars 1-3x a day - they are great: - am: lately 150-170 >> 89-122, 143 - 2h after b'fast: 90-120 >> 91-168 - before lunch: n/c >> 108 - 2h after lunch: n/c >> 153  - before dinner: n/c >> 76-103 - 2h after dinner: 118-140 >> 60 - bedtime: n/c >> 65-103 - nighttime: n/c >> 80 Lowest 60. No lows recenty; he has hypoglycemia awareness at 70.  Highest sugar was 200x1 >> 168  Glucometer: Freestyle  Pt's meals are: - Breakfast: bacon + eggs, sausage, grits, oatmeal, cereal, toast wheat - Lunch: BLT, soups, fruit, nuts - Dinner: chicken, pork + greens, rice  - Snacks: 1 a day: pretzels; carrots  Walks 3x a week at the [facility].  - + mild CKD, last BUN/creatinine:  01/08/2014: 18/1.15 Lab Results  Component Value Date   BUN 16 02/14/2013   CREATININE 1.13 02/14/2013  On Valsartan. - last set of lipids: 01/08/2014: 161/98/60/73 Lab Results  Component Value Date   CHOL 182 03/18/2012   HDL 53 03/18/2012   LDLCALC 90 03/18/2012   TRIG 193* 03/18/2012   CHOLHDL 3.4 03/18/2012  He is on Crestor 40 mg daily. - last eye exam was in summer 2015. No DR.  - no numbness and tingling in his feet.  ROS: Constitutional: no weight gain/loss, + fatigue, no subjective hyperthermia/hypothermia Eyes: no blurry vision, no xerophthalmia ENT: no sore throat, no nodules palpated in throat, no dysphagia/odynophagia, no hoarseness Cardiovascular: no CP/SOB/palpitations/leg swelling Respiratory: no cough/SOB Gastrointestinal: + N/+ V/no D/C/+ heartburn Musculoskeletal: no muscle/joint aches Skin: no rashes Neurological: no tremors/numbness/tingling/dizziness  I reviewed pt's medications, allergies, PMH, social hx, family hx, and changes were documented in the history of present illness. Otherwise, unchanged from my initial visit note:  Past Medical History  Diagnosis Date  . Diabetes mellitus without complication   . Hypertension   . Hyperlipidemia   . 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 OSA (obstructive sleep apnea) 03/18/2012    Doing better with CPAP  . 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 Pulmonary hypertension 12/10/2010    ECHO:  Mild PH,mild LVH; PA pressures estimated 30-40 mmHg  . Wenckebach 04/09/2012-05/09/2012    Event monitor; usually during sleeping hours; therefore not on beta  blocker  . History of GI bleed January 2015    No obvious findings on EGD/colonoscopy   Past Surgical History  Procedure Laterality Date  . Shoulder arthroscopy  2006  . Appendectomy  1974  . Carpal tunnel release    . Hemorrhoid surgery    . Eye surgery  1960    Left eye  . Cardiac catheterization  03/27/2010    Moderate mid RCA lesion,right radial approach,normal EF  . Left heart catheterization with coronary angiogram N/A 03/17/2012    Procedure:  LEFT HEART CATHETERIZATION WITH CORONARY ANGIOGRAM;  Surgeon: Marykay Lex, MD;  Location: [facility] CATH LAB;  Service: Cardiovascular;  Laterality: N/A;   History   Social History  . Marital Status: Married    Spouse Name: N/A   Occupational History  . retired   Social History Main Topics  . Smoking status: Former Smoker -- 2.00 packs/day    Quit date: 04/12/2012  . Smokeless tobacco: Never Used  . Alcohol Use: 0.5 oz/week    1 drink(s) per week  . Drug Use: Yes    Special: Marijuana     Comment: cannibus   Social History Narrative   Married, father of 2, grandfather of 3.   He is a former smoker of about a pack to pack and half cigarettes a day -- he quit in March of this year.    He is an avid exerciser working at least 4-5 days a week doing her walking or stationary bike.   Current Outpatient Prescriptions on File Prior to Visit  Medication Sig Dispense Refill  . Cholecalciferol (VITAMIN D-3) 1000 UNITS CAPS Take by mouth daily. 2 capsules.    . ferrous sulfate 325 (65 FE) MG tablet Take 1 tablet (325 mg total) by mouth 2 (two) times daily with a meal.  3  . fish oil-omega-3 fatty acids 1000 MG capsule Take 1 g by mouth daily.    Marland Kitchen glipiZIDE (GLUCOTROL XL) 5 MG 24 hr tablet Take 1 tablet (5 mg total) by mouth daily with breakfast. 30 tablet 5  . glucose blood (FREESTYLE TEST STRIPS) test strip     . metFORMIN (GLUCOPHAGE-XR) 500 MG 24 hr tablet Take 2 tablets (1,000 mg total) by mouth 2 (two) times daily with a meal. 120 tablet 1  . Multiple Vitamin (MULTIVITAMIN WITH MINERALS) TABS Take 1 tablet by mouth daily.    Marland Kitchen PRESCRIPTION MEDICATION Uses C-PAP at bedtime    . ranitidine (ZANTAC) 300 MG tablet Take 300 mg by mouth at bedtime.    . rosuvastatin (CRESTOR) 40 MG tablet Take 40 mg by mouth daily.    . valsartan-hydrochlorothiazide (DIOVAN-HCT) 320-25 MG per tablet Take 1 tablet by mouth daily.     No current facility-administered medications on file prior to visit.   No  Known Allergies Family History  Problem Relation Age of Onset  . Coronary artery disease Father   . Kidney failure Mother   . Kidney failure Sister    PE: BP 116/64 mmHg  Pulse 83  Temp(Src) 98 F (36.7 C) (Oral)  Resp 12  Wt 208 lb (94.348 kg)  SpO2 96% Body mass index is 31.85 kg/(m^2). Wt Readings from Last 3 Encounters:  06/10/14 208 lb (94.348 kg)  04/29/14 220 lb 12.8 oz (100.154 kg)  10/16/13 221 lb 1.6 oz (100.29 kg)   Constitutional: overweight, in NAD Eyes: PERRLA, EOMI, no exophthalmos ENT: moist mucous membranes, no thyromegaly, no cervical lymphadenopathy Cardiovascular: RRR, No MRG Respiratory: CTA B  Gastrointestinal: abdomen soft, NT, ND, BS+ Musculoskeletal: no deformities, strength intact in all 4 Skin: moist, warm, no rashes Neurological: no tremor with outstretched hands, DTR normal in all 4  ASSESSMENT: 1. DM2, non-insulin-dependent, uncontrolled, with complications - heart ds - ?  - mild CKD  PLAN:  1. Patient with long-standing, uncontrolled diabetes, on oral antidiabetic regimen, with much improved control after adding Metformin. Will switch to Metformin XR 2/2 GI sxs. I also advised him to take it with low fat meals. At next visit, if still lows, will stop or decrease the Glipizide XL. -  I suggested to:  Patient Instructions  Please stop the regular Metformin x 2 days. Tomorrow night, take 500 mg Metformin ER with dinner. In few days, if no nausea, add another tablet with breakfast. Stay on 500 mg Metformin ER 2x a day.   Continue Glipizide XL 5 mg in am, but move it before breakfast.  Please return in 1 month with your sugar log.   - continue checking sugars at different times of the day - check 2 times a day, rotating checks - UTD with yearly eye exams - Return to clinic in 1 mo with sugar log >> check HbA1c then

## 2014-06-20 DIAGNOSIS — K219 Gastro-esophageal reflux disease without esophagitis: Secondary | ICD-10-CM | POA: Diagnosis not present

## 2014-06-20 DIAGNOSIS — D500 Iron deficiency anemia secondary to blood loss (chronic): Secondary | ICD-10-CM | POA: Diagnosis not present

## 2014-07-16 ENCOUNTER — Encounter: Payer: Self-pay | Admitting: Internal Medicine

## 2014-07-16 ENCOUNTER — Ambulatory Visit (INDEPENDENT_AMBULATORY_CARE_PROVIDER_SITE_OTHER): Payer: Medicare Other | Admitting: Internal Medicine

## 2014-07-16 VITALS — BP 128/64 | HR 85 | Temp 98.6°F | Resp 12 | Wt 211.0 lb

## 2014-07-16 DIAGNOSIS — E1159 Type 2 diabetes mellitus with other circulatory complications: Secondary | ICD-10-CM

## 2014-07-16 LAB — HEMOGLOBIN A1C: Hgb A1c MFr Bld: 6.6 % — ABNORMAL HIGH (ref 4.6–6.5)

## 2014-07-16 MED ORDER — METFORMIN HCL ER 500 MG PO TB24: 500.0000 mg | ORAL_TABLET | Freq: Two times a day (BID) | ORAL | Status: DC

## 2014-07-16 MED ORDER — GLIPIZIDE ER 2.5 MG PO TB24: 2.5000 mg | ORAL_TABLET | Freq: Every day | ORAL | Status: DC

## 2014-07-16 NOTE — Progress Notes (Signed)
Patient ID: James Jones, male   DOB: [birth year], 69 y.o.   MRN: [id number]  HPI: James Jones is a 69 y.o.-year-old male, returning for f/u for DM2, dx in ~2005, non-insulin-dependent, uncontrolled, with complications (heart ds., mild CKD). Last visit 1 mo ago. PCP:Dr. Shanon PayorStewart Cooper; PA Haze Rushingeborah Cobb  Last hemoglobin A1c was: 04/11/2014: HbA1c 8.4% 01/08/2014: HbA1c 7.7% Lab Results  Component Value Date   HGBA1C 6.1* 03/17/2012  He had steroid inj for gout in his elbows - 12/2013. He also had a steroid inj in knee last fall, too.   Pt is on a regimen of: - Glipizide ER 5 mg daily - Metformin ER 500 mg 2x a day. Had nausea, vomiting, loss of appetite with regular.  He was on Actos. He also tried Metformin in the past >> unclear why.  He was on insulin before (Lantus) - came off years ago.  Pt checks his sugars 1-3x a day - they are great! >> even has lows: - am: lately 150-170 >> 89-122, 143 >> 96-106, 169 (icecream the night before) - 2h after b'fast: 90-120 >> 91-168 >> n/c - before lunch: n/c >> 108 >> 116-137 - 2h after lunch: n/c >> 153  >> n/c - before dinner: n/c >> 76-103 >> 61-96, 104 - 2h after dinner: 118-140 >> 60 >> n/c - bedtime: n/c >> 65-103 >> 96 - nighttime: n/c >> 80 Lowest 60. No lows recenty; he has hypoglycemia awareness at 70.  Highest sugar was 200x1 >> 168  Glucometer: Freestyle  Pt's meals are: - Breakfast: bacon + eggs, sausage, grits, oatmeal, cereal, toast wheat - Lunch: BLT, soups, fruit, nuts - Dinner: chicken, pork + greens, rice  - Snacks: 1 a day: pretzels; carrots  Wal[facility] a [facility] at [facility].  - + mild CKD, last BUN/creatinine:  01/08/2014: 18/1.15 Lab Results  Component Value Date   BUN 16 02/14/2013   CREATININE 1.13 02/14/2013  On Valsartan. - last set of lipids: 01/08/2014: 161/98/60/73 Lab Results  Component Value Date   CHOL 182 03/18/2012   HDL 53 03/18/2012   LDLCALC 90 03/18/2012   TRIG 193* 03/18/2012   CHOLHDL  3.4 03/18/2012  He is on Crestor 40 mg daily. - last eye exam was in 09/2013. No DR.  - no numbness and tingling in his feet.  ROS: Constitutional: no weight gain/loss, + fatigue, no subjective hyperthermia/hypothermia Eyes: no blurry vision, no xerophthalmia ENT: no sore throat, no nodules palpated in throat, no dysphagia/odynophagia, no hoarseness Cardiovascular: no CP/SOB/palpitations/leg swelling Respiratory: no cough/SOB Gastrointestinal: no N/V/D/C/heartburn Musculoskeletal: no muscle/joint aches Skin: no rashes Neurological: no tremors/numbness/tingling/dizziness + low libido  I reviewed pt's medications, allergies, PMH, social hx, family hx, and changes were documented in the history of present illness. Otherwise, unchanged from my initial visit note:  Past Medical History  Diagnosis Date  . Diabetes mellitus without complication   . Hypertension   . Hyperlipidemia   . 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 OSA (obstructive sleep apnea) 03/18/2012    Doing better with CPAP  . 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 Pulmonary hypertension 12/10/2010    ECHO:  Mild PH,mild LVH; PA pressures estimated 30-40 mmHg  . 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 History  of GI bleed January 2015    No obvious findings on EGD/colonoscopy   Past Surgical History  Procedure Laterality Date  . Shoulder arthroscopy  2006  . Appendectomy  1974  . Carpal tunnel release    . Hemorrhoid surgery    . Eye surgery  1960    Left eye  . Cardiac catheterization  03/27/2010    Moderate mid RCA lesion,right radial approach,normal EF  . Left heart catheterization with coronary angiogram N/A 03/17/2012    Procedure: LEFT HEART CATHETERIZATION WITH CORONARY ANGIOGRAM;   Surgeon: Marykay Lex, MD;  Location: [facility] CATH LAB;  Service: Cardiovascular;  Laterality: N/A;   History   Social History  . Marital Status: Married    Spouse Name: N/A   Occupational History  . retired   Social History Main Topics  . Smoking status: Former Smoker -- 2.00 packs/day    Quit date: 04/12/2012  . Smokeless tobacco: Never Used  . Alcohol Use: 0.5 oz/week    1 drink(s) per week  . Drug Use: Yes    Special: Marijuana     Comment: cannibus   Social History Narrative   Married, father of 2, grandfather of 3.   He is a former smoker of about a pack to pack and half cigarettes a day -- he quit in March of this year.    He is an avid exerciser working at least 4-5 days a week doing her walking or stationary bike.   Current Outpatient Prescriptions on File Prior to Visit  Medication Sig Dispense Refill  . Cholecalciferol (VITAMIN D-3) 1000 UNITS CAPS Take by mouth daily. 2 capsules.    . ferrous sulfate 325 (65 FE) MG tablet Take 1 tablet (325 mg total) by mouth 2 (two) times daily with a meal.  3  . fish oil-omega-3 fatty acids 1000 MG capsule Take 1 g by mouth daily.    Marland Kitchen glipiZIDE (GLUCOTROL XL) 5 MG 24 hr tablet Take 1 tablet (5 mg total) by mouth daily with breakfast. 30 tablet 5  . glucose blood (FREESTYLE TEST STRIPS) test strip     . metFORMIN (GLUCOPHAGE-XR) 500 MG 24 hr tablet Take 2 tablets (1,000 mg total) by mouth 2 (two) times daily with a meal. 120 tablet 1  . Multiple Vitamin (MULTIVITAMIN WITH MINERALS) TABS Take 1 tablet by mouth daily.    Marland Kitchen PRESCRIPTION MEDICATION Uses C-PAP at bedtime    . ranitidine (ZANTAC) 300 MG tablet Take 300 mg by mouth at bedtime.    . rosuvastatin (CRESTOR) 40 MG tablet Take 40 mg by mouth daily.    . valsartan-hydrochlorothiazide (DIOVAN-HCT) 320-25 MG per tablet Take 1 tablet by mouth daily.     No current facility-administered medications on file prior to visit.   No Known Allergies Family History  Problem Relation  Age of Onset  . Coronary artery disease Father   . Kidney failure Mother   . Kidney failure Sister    PE: BP 128/64 mmHg  Pulse 85  Temp(Src) 98.6 F (37 C) (Oral)  Resp 12  Wt 211 lb (95.709 kg)  SpO2 96% Body mass index is 32.31 kg/(m^2). Wt Readings from Last 3 Encounters:  07/16/14 211 lb (95.709 kg)  06/10/14 208 lb (94.348 kg)  04/29/14 220 lb 12.8 oz (100.154 kg)   Constitutional: overweight, in NAD Eyes: PERRLA, EOMI, no exophthalmos ENT: moist mucous membranes, no thyromegaly, no cervical lymphadenopathy Cardiovascular: RRR, No MRG Respiratory: CTA B Gastrointestinal: abdomen soft, NT, ND, BS+  Musculoskeletal: no deformities, strength intact in all 4 Skin: moist, warm, no rashes Neurological: no tremor with outstretched hands, DTR normal in all 4  ASSESSMENT: 1. DM2, non-insulin-dependent, uncontrolled, with complications - heart ds - ?  - mild CKD  PLAN:  1. Patient with long-standing, uncontrolled diabetes, on oral antidiabetic regimen, with much improved control after adding Metformin XR. Will need to or decrease the Glipizide XL b/c low CBGs later in the day. -  I suggested to:  Patient Instructions  Please continue Metformin XR 500 mg 2x a day with meals. Decrease Glipizide XL to 2.5 mg in am, before breakfast.  Please stop at the lab.  Please return in 3 months with your sugar log.   - continue checking sugars at different times of the day - check 2 times a day, rotating checks - UTD with yearly eye exams - check HbA1c then  - Return to clinic in 3 mo with sugar log   Office Visit on 07/16/2014  Component Date Value Ref Range Status  . Hgb A1c MFr Bld 07/16/2014 6.6* 4.6 - 6.5 % Final   Glycemic Control Guidelines for People with Diabetes:Non Diabetic:  <6%Goal of Therapy: <7%Additional Action Suggested:  >8%    HbA1c much improved!

## 2014-07-16 NOTE — Patient Instructions (Signed)
Please continue Metformin XR 500 mg 2x a day with meals. Decrease Glipizide XL to 2.5 mg in am, before breakfast.  Please stop at the lab.  Please return in 3 months with your sugar log.

## 2014-08-26 DIAGNOSIS — K219 Gastro-esophageal reflux disease without esophagitis: Secondary | ICD-10-CM | POA: Diagnosis not present

## 2014-08-26 DIAGNOSIS — D500 Iron deficiency anemia secondary to blood loss (chronic): Secondary | ICD-10-CM | POA: Diagnosis not present

## 2014-10-02 ENCOUNTER — Other Ambulatory Visit: Payer: Self-pay | Admitting: Internal Medicine

## 2014-10-08 DIAGNOSIS — E119 Type 2 diabetes mellitus without complications: Secondary | ICD-10-CM | POA: Diagnosis not present

## 2014-10-08 DIAGNOSIS — H25013 Cortical age-related cataract, bilateral: Secondary | ICD-10-CM | POA: Diagnosis not present

## 2014-10-08 DIAGNOSIS — H2513 Age-related nuclear cataract, bilateral: Secondary | ICD-10-CM | POA: Diagnosis not present

## 2014-10-08 DIAGNOSIS — H524 Presbyopia: Secondary | ICD-10-CM | POA: Diagnosis not present

## 2014-10-08 LAB — HM DIABETES EYE EXAM

## 2014-10-09 ENCOUNTER — Telehealth: Payer: Self-pay | Admitting: Internal Medicine

## 2014-10-09 MED ORDER — GLIPIZIDE ER 2.5 MG PO TB24: 2.5000 mg | ORAL_TABLET | Freq: Every day | ORAL | Status: DC

## 2014-10-09 MED ORDER — VALSARTAN-HYDROCHLOROTHIAZIDE 320-25 MG PO TABS: 1.0000 | ORAL_TABLET | Freq: Every day | ORAL | Status: DC

## 2014-10-09 NOTE — Telephone Encounter (Signed)
Ok to refill pt's Valsartan? Please advise.

## 2014-10-09 NOTE — Telephone Encounter (Signed)
Done

## 2014-10-09 NOTE — Telephone Encounter (Signed)
OK to refill them. 

## 2014-10-09 NOTE — Telephone Encounter (Signed)
Pt needs refiills on valsartan, hctz 320-325mg   ( 90 tablets),  Glipizide- ER  90 tablets @ [facility] on [address]

## 2014-10-22 ENCOUNTER — Other Ambulatory Visit (INDEPENDENT_AMBULATORY_CARE_PROVIDER_SITE_OTHER): Payer: Medicare Other | Admitting: *Deleted

## 2014-10-22 ENCOUNTER — Ambulatory Visit (INDEPENDENT_AMBULATORY_CARE_PROVIDER_SITE_OTHER): Payer: Medicare Other | Admitting: Internal Medicine

## 2014-10-22 ENCOUNTER — Encounter: Payer: Self-pay | Admitting: Internal Medicine

## 2014-10-22 VITALS — BP 132/68 | HR 74 | Temp 98.1°F | Resp 12 | Wt 204.0 lb

## 2014-10-22 DIAGNOSIS — Z23 Encounter for immunization: Secondary | ICD-10-CM

## 2014-10-22 DIAGNOSIS — E1159 Type 2 diabetes mellitus with other circulatory complications: Secondary | ICD-10-CM

## 2014-10-22 LAB — POCT GLYCOSYLATED HEMOGLOBIN (HGB A1C): Hemoglobin A1C: 6.5

## 2014-10-22 NOTE — Progress Notes (Signed)
Patient ID: James Jones, male   DOB: [birth year], 69 y.o.   MRN: [id number]  HPI: James Jones is a 69 y.o.-year-old male, returning for f/u for DM2, dx in ~2005, non-insulin-dependent, uncontrolled, with complications (heart ds., mild CKD). Last visit 3 mo ago. PCP:Dr. Shanon Payor; PA Haze Rushing  Last hemoglobin A1c was: Lab Results  Component Value Date   HGBA1C 6.6* 07/16/2014   HGBA1C 6.1* 03/17/2012  04/11/2014: HbA1c 8.4% 01/08/2014: HbA1c 7.7% He had steroid inj for gout in his elbows - 12/2013. He also had a steroid inj in knee last fall, too.   Pt is on a regimen of: - Glipizide ER 2.5 mg daily - Metformin ER 500 mg 2x a day.  Had nausea, vomiting, loss of appetite with regular metformin.  He was on Actos. He also tried Metformin in the past >> unclear why.  He was on insulin before (Lantus) - came off years ago.  Pt checks his sugars 1-3x a day - they are great! Except 2 lows when he skipped 2 meals despite taking Glipizide that am: - am: lately 150-170 >> 89-122, 143 >> 96-106, 169 (icecream the night before) >> 93-123 - 2h after b'fast: 90-120 >> 91-168 >> n/c >> 97-199 (after pancakes) - before lunch: n/c >> 108 >> 116-137 >> 111-118 - 2h after lunch: n/c >> 153  >> n/c >> 114-124 - before dinner: n/c >> 76-103 >> 61-96, 104 >> 45, 52 (skipped lunch and b'fast), 67-129 - 2h after dinner: 118-140 >> 60 >> n/c >> 80-153 - bedtime: n/c >> 65-103 >> 96 >> 80-112, 183 - nighttime: n/c >> 80 Lowest 60. No lows recenty; he has hypoglycemia awareness at 70.  Highest sugar was 200x1 >> 168 >> 199  Glucometer: Freestyle  Pt's meals are: - Breakfast: bacon + eggs, sausage, grits, oatmeal, cereal, toast wheat - Lunch: BLT, soups, fruit, nuts - Dinner: chicken, pork + greens, rice  - Snacks: 1 a day: pretzels; carrots  Walks 3x a week at the [facility].  - + mild CKD, last BUN/creatinine:  01/08/2014: 18/1.15 Lab Results  Component Value Date   BUN 16 02/14/2013    CREATININE 1.13 02/14/2013  On Valsartan. - last set of lipids: 01/08/2014: 161/98/60/73 Lab Results  Component Value Date   CHOL 182 03/18/2012   HDL 53 03/18/2012   LDLCALC 90 03/18/2012   TRIG 193* 03/18/2012   CHOLHDL 3.4 03/18/2012  He is on Crestor 40 mg daily. - last eye exam was in 09/2014. No DR.  - no numbness and tingling in his feet.  ROS: Constitutional: no weight gain/loss, + fatigue, no subjective hyperthermia/hypothermia, + nocturia Eyes: no blurry vision, no xerophthalmia ENT: no sore throat, no nodules palpated in throat, no dysphagia/odynophagia, no hoarseness Cardiovascular: no CP/SOB/palpitations/leg swelling Respiratory: no cough/SOB Gastrointestinal: no N/V/D/C/heartburn Musculoskeletal: no muscle/joint aches, + AP (Dr Ewing Schlein) Skin: no rashes Neurological: no tremors/numbness/tingling/dizziness  I reviewed pt's medications, allergies, PMH, social hx, family hx, and changes were documented in the history of present illness. Otherwise, unchanged from my initial visit note:  Past Medical History  Diagnosis Date  . Diabetes mellitus without complication   . Hypertension   . Hyperlipidemia   . 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 OSA (obstructive sleep apnea) 03/18/2012    Doing better with CPAP  . 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 CAD (coronary artery disease), with 60-70% stenosis in RCA 03/18/2012  FFR 0.84; EF 60-65%  . Pulmonary hypertension 12/10/2010    ECHO:  Mild PH,mild LVH; PA pressures estimated 30-40 mmHg  . 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 History of GI bleed January 2015    No obvious findings on EGD/colonoscopy   Past Surgical History  Procedure Laterality Date  . Shoulder arthroscopy  2006  . Appendectomy  1974  . Carpal tunnel release    . Hemorrhoid surgery    . Eye surgery  1960     Left eye  . Cardiac catheterization  03/27/2010    Moderate mid RCA lesion,right radial approach,normal EF  . Left heart catheterization with coronary angiogram N/A 03/17/2012    Procedure: LEFT HEART CATHETERIZATION WITH CORONARY ANGIOGRAM;  Surgeon: Marykay Lex, MD;  Location: [facility] CATH LAB;  Service: Cardiovascular;  Laterality: N/A;   History   Social History  . Marital Status: Married    Spouse Name: N/A   Occupational History  . retired   Social History Main Topics  . Smoking status: Former Smoker -- 2.00 packs/day    Quit date: 04/12/2012  . Smokeless tobacco: Never Used  . Alcohol Use: 0.5 oz/week    1 drink(s) per week  . Drug Use: Yes    Special: Marijuana     Comment: cannibus   Social History Narrative   Married, father of 2, grandfather of 3.   He is a former smoker of about a pack to pack and half cigarettes a day -- he quit in March of this year.    He is an avid exerciser working at least 4-5 days a week doing her walking or stationary bike.   Current Outpatient Prescriptions on File Prior to Visit  Medication Sig Dispense Refill  . Cholecalciferol (VITAMIN D-3) 1000 UNITS CAPS Take by mouth daily. 2 capsules.    . ferrous sulfate 325 (65 FE) MG tablet Take 1 tablet (325 mg total) by mouth 2 (two) times daily with a meal.  3  . fish oil-omega-3 fatty acids 1000 MG capsule Take 1 g by mouth daily.    Marland Kitchen glipiZIDE (GLUCOTROL XL) 2.5 MG 24 hr tablet Take 1 tablet (2.5 mg total) by mouth daily with breakfast. 90 tablet 0  . glucose blood (FREESTYLE TEST STRIPS) test strip     . metFORMIN (GLUCOPHAGE-XR) 500 MG 24 hr tablet Take 1 tablet (500 mg total) by mouth 2 (two) times daily with a meal. 120 tablet 1  . metFORMIN (GLUCOPHAGE-XR) 500 MG 24 hr tablet TAKE 2 TABLETS (1,000 MG TOTAL) BY MOUTH 2 (TWO) TIMES DAILY WITH A MEAL. 120 tablet 1  . Multiple Vitamin (MULTIVITAMIN WITH MINERALS) TABS Take 1 tablet by mouth daily.    Marland Kitchen PRESCRIPTION MEDICATION Uses C-PAP at  bedtime    . ranitidine (ZANTAC) 300 MG tablet Take 300 mg by mouth at bedtime.    . rosuvastatin (CRESTOR) 40 MG tablet Take 40 mg by mouth daily.    . valsartan-hydrochlorothiazide (DIOVAN-HCT) 320-25 MG per tablet Take 1 tablet by mouth daily. 90 tablet 0   No current facility-administered medications on file prior to visit.   No Known Allergies Family History  Problem Relation Age of Onset  . Coronary artery disease Father   . Kidney failure Mother   . Kidney failure Sister    PE: BP 132/68 mmHg  Pulse 74  Temp(Src) 98.1 F (36.7 C) (Oral)  Resp 12  Wt 204  lb (92.534 kg)  SpO2 97% Body mass index is 31.24 kg/(m^2). Wt Readings from Last 3 Encounters:  10/22/14 204 lb (92.534 kg)  07/16/14 211 lb (95.709 kg)  06/10/14 208 lb (94.348 kg)   Constitutional: overweight, in NAD Eyes: PERRLA, EOMI, no exophthalmos ENT: moist mucous membranes, no thyromegaly, no cervical lymphadenopathy Cardiovascular: RRR, No MRG Respiratory: CTA B Gastrointestinal: abdomen soft, NT, ND, BS+ Musculoskeletal: no deformities, strength intact in all 4 Skin: moist, warm, no rashes Neurological: no tremor with outstretched hands, DTR normal in all 4  ASSESSMENT: 1. DM2, non-insulin-dependent, uncontrolled, with complications - heart ds - ?  - mild CKD  PLAN:  1. Patient with long-standing, uncontrolled diabetes, on oral antidiabetic regimen, with much improved control after adding Metformin XR. Will continue Glipizide XL but strongly advised him not to skip meals when he takes it. We discussed about starting Tradjenta instead but he would like to see GI first as he has some AP (has seen Dr Ewing Schlein in the past). -  I suggested to:  Patient Instructions  Please continue Metformin XR 500 mg 2x a day with meals. Continue Glipizide XL 2.5 mg in am, before breakfast.  Please return in 3 months with your sugar log.   - continue checking sugars at different times of the day - check 2 times a day,  rotating checks - UTD with yearly eye exams - check HbA1c today >> 6.5% (excellent!) - given flu shot today - he is looking for a new PCP as his PCPs office closed - Return to clinic in 3 mo with sugar log

## 2014-10-22 NOTE — Patient Instructions (Signed)
Please continue Metformin XR 500 mg 2x a day with meals. Continue Glipizide XL 2.5 mg in am, before breakfast.  Please return in 3 months with your sugar log.   

## 2014-11-14 DIAGNOSIS — Z125 Encounter for screening for malignant neoplasm of prostate: Secondary | ICD-10-CM | POA: Diagnosis not present

## 2014-11-14 DIAGNOSIS — D500 Iron deficiency anemia secondary to blood loss (chronic): Secondary | ICD-10-CM | POA: Diagnosis not present

## 2014-11-14 DIAGNOSIS — R109 Unspecified abdominal pain: Secondary | ICD-10-CM | POA: Diagnosis not present

## 2014-11-28 ENCOUNTER — Other Ambulatory Visit: Payer: Self-pay | Admitting: Internal Medicine

## 2014-12-02 ENCOUNTER — Ambulatory Visit (INDEPENDENT_AMBULATORY_CARE_PROVIDER_SITE_OTHER): Payer: Medicare Other | Admitting: Physician Assistant

## 2014-12-02 ENCOUNTER — Encounter: Payer: Self-pay | Admitting: Physician Assistant

## 2014-12-02 VITALS — BP 144/76 | HR 72 | Temp 98.9°F | Resp 18 | Ht 66.25 in | Wt 209.0 lb

## 2014-12-02 DIAGNOSIS — J988 Other specified respiratory disorders: Secondary | ICD-10-CM | POA: Diagnosis not present

## 2014-12-02 DIAGNOSIS — B9689 Other specified bacterial agents as the cause of diseases classified elsewhere: Principal | ICD-10-CM

## 2014-12-02 MED ORDER — AZITHROMYCIN 250 MG PO TABS: ORAL_TABLET | ORAL | Status: DC

## 2014-12-02 NOTE — Progress Notes (Signed)
Patient ID: James CanalesJames Allard MRN: [id number], DOB: 1945-10-21, 69 y.o. Date of Encounter: 12/02/2014, 12:34 PM    Chief Complaint:  Chief Complaint  Patient presents with  . new pt est care     HPI: 69 y.o. year old AA male is here to establish as a new patient.  He sees the following physicians on a routine basis: Cardiology------------------------------------------------- Dr. Herbie BaltimoreHarding Internal medicine----diabetes management----Dr. Lafe GarinGherge Ophthalmologist-------------------------------------- [facility] Gastroenterologist------------------------------------- Dr. Ewing SchleinMagod Renal------------------------------------------------------ Dr. Lowell GuitarPowell  He says for his primary care provider he was [facility]. However that office recently closed. Says he needed to reestablish with a new primary care provider. Says that he would only goes a year for acute illnesses--- not have to go there for any routine follow-up of chronic medical problems as he sees the above specialist for these issues.  States that he just had one issue that he wanted to address today. Says that recently he has been coughing up thick dark phlegm. Says that he is not having any mucus or drainage from the nose. No sore throat. No ear ache. No fevers or chills.     Home Meds:   Outpatient Prescriptions Prior to Visit  Medication Sig Dispense Refill  . Cholecalciferol (VITAMIN D-3) 1000 UNITS CAPS Take by mouth daily. 2 capsules.    . ferrous sulfate 325 (65 FE) MG tablet Take 1 tablet (325 mg total) by mouth 2 (two) times daily with a meal.  3  . fish oil-omega-3 fatty acids 1000 MG capsule Take 1 g by mouth daily.    Marland Kitchen. glipiZIDE (GLUCOTROL XL) 2.5 MG 24 hr tablet Take 1 tablet (2.5 mg total) by mouth daily with breakfast. 90 tablet 0  . glucose blood (FREESTYLE TEST STRIPS) test strip     . Multiple Vitamin (MULTIVITAMIN WITH MINERALS) TABS Take 1 tablet by mouth daily.    Marland Kitchen.  PRESCRIPTION MEDICATION Uses C-PAP at bedtime    . ranitidine (ZANTAC) 300 MG tablet Take 300 mg by mouth at bedtime.    . rosuvastatin (CRESTOR) 40 MG tablet Take 40 mg by mouth daily.    . valsartan-hydrochlorothiazide (DIOVAN-HCT) 320-25 MG per tablet Take 1 tablet by mouth daily. 90 tablet 0  . metFORMIN (GLUCOPHAGE-XR) 500 MG 24 hr tablet Take 1 tablet (500 mg total) by mouth 2 (two) times daily with a meal. 120 tablet 1  . metFORMIN (GLUCOPHAGE-XR) 500 MG 24 hr tablet TAKE 2 TABLETS (1,000 MG TOTAL) BY MOUTH 2 (TWO) TIMES DAILY WITH A MEAL. 120 tablet 1   No facility-administered medications prior to visit.    Allergies: No Known Allergies    Review of Systems: See HPI for pertinent ROS. All other ROS negative.    Physical Exam: Blood pressure 144/76, pulse 72, temperature 98.9 F (37.2 C), temperature source Oral, resp. rate 18., There is no weight on file to calculate BMI. General:  Overweight African-American male . Appears in no acute distress. HEENT: Normocephalic, atraumatic, eyes without discharge, sclera non-icteric, nares are without discharge. Bilateral auditory canals clear, TM's are without perforation, pearly grey and translucent with reflective cone of light bilaterally. Oral cavity moist, posterior pharynx without exudate, erythema, peritonsillar abscess.  Neck: Supple. No thyromegaly. No lymphadenopathy. Lungs: Clear bilaterally to auscultation without wheezes, rales, or rhonchi. Breathing is unlabored. Heart: Regular rhythm. No murmurs, rubs, or gallops. Msk:  Strength and tone normal for age. Extremities/Skin: Warm and dry. Neuro: Alert and oriented X 3. Moves all extremities  spontaneously. Gait is normal. CNII-XII grossly in tact. Psych:  Responds to questions appropriately with a normal affect.     ASSESSMENT AND PLAN:  69 y.o. year old male with  1. Bacterial respiratory infection - azithromycin (ZITHROMAX) 250 MG tablet; Day 1: Take 2 daily.  Days 2-5:  Take 1 daily.  Dispense: 6 tablet; Refill: 0 He is to take antibiotic as directed. Also recommend that he use Mucinex DM as expectorant. Follow-up if symptoms do not resolve within 1 week after completion of antibiotic.  [address] [place], [place], [facility] 12/02/2014 12:34 PM

## 2014-12-25 DIAGNOSIS — H2513 Age-related nuclear cataract, bilateral: Secondary | ICD-10-CM | POA: Diagnosis not present

## 2014-12-25 DIAGNOSIS — E119 Type 2 diabetes mellitus without complications: Secondary | ICD-10-CM | POA: Diagnosis not present

## 2014-12-25 DIAGNOSIS — H25013 Cortical age-related cataract, bilateral: Secondary | ICD-10-CM | POA: Diagnosis not present

## 2015-01-04 ENCOUNTER — Other Ambulatory Visit: Payer: Self-pay | Admitting: Internal Medicine

## 2015-01-09 DIAGNOSIS — H2511 Age-related nuclear cataract, right eye: Secondary | ICD-10-CM | POA: Diagnosis not present

## 2015-01-09 DIAGNOSIS — E139 Other specified diabetes mellitus without complications: Secondary | ICD-10-CM | POA: Diagnosis not present

## 2015-01-09 DIAGNOSIS — H25011 Cortical age-related cataract, right eye: Secondary | ICD-10-CM | POA: Diagnosis not present

## 2015-01-09 DIAGNOSIS — H25811 Combined forms of age-related cataract, right eye: Secondary | ICD-10-CM | POA: Diagnosis not present

## 2015-01-09 DIAGNOSIS — H52201 Unspecified astigmatism, right eye: Secondary | ICD-10-CM | POA: Diagnosis not present

## 2015-01-21 ENCOUNTER — Encounter: Payer: Self-pay | Admitting: Internal Medicine

## 2015-01-21 ENCOUNTER — Other Ambulatory Visit (INDEPENDENT_AMBULATORY_CARE_PROVIDER_SITE_OTHER): Payer: Medicare Other | Admitting: *Deleted

## 2015-01-21 ENCOUNTER — Ambulatory Visit (INDEPENDENT_AMBULATORY_CARE_PROVIDER_SITE_OTHER): Payer: Medicare Other | Admitting: Internal Medicine

## 2015-01-21 VITALS — BP 130/62 | HR 75 | Temp 97.6°F | Resp 12 | Wt 204.8 lb

## 2015-01-21 DIAGNOSIS — E1159 Type 2 diabetes mellitus with other circulatory complications: Secondary | ICD-10-CM

## 2015-01-21 DIAGNOSIS — E785 Hyperlipidemia, unspecified: Secondary | ICD-10-CM

## 2015-01-21 LAB — POCT GLYCOSYLATED HEMOGLOBIN (HGB A1C): Hemoglobin A1C: 7.1

## 2015-01-21 MED ORDER — ROSUVASTATIN CALCIUM 40 MG PO TABS: 40.0000 mg | ORAL_TABLET | Freq: Every day | ORAL | Status: DC

## 2015-01-21 NOTE — Patient Instructions (Signed)
Please continue Metformin XR 500 mg 2x a day with meals. Continue Glipizide XL 2.5 mg in am, before breakfast.  Please return in 3 months with your sugar log.

## 2015-01-21 NOTE — Progress Notes (Signed)
Patient ID: James Jones, male   DOB: 1945/06/25, 69 y.o.   MRN: [id number]  HPI: James Jones is a 69 y.o.-year-old male, returning for f/u for DM2, dx in ~2005, non-insulin-dependent, uncontrolled, with complications (heart ds., mild CKD). Last visit 3 mo ago. PCP:Dr. Shanon Payor; PA [facility]  Last hemoglobin A1c was: Lab Results  Component Value Date   HGBA1C 6.5 10/22/2014   HGBA1C 6.6* 07/16/2014   HGBA1C 6.1* 03/17/2012  04/11/2014: HbA1c 8.4% 01/08/2014: HbA1c 7.7% He had steroid inj for gout in his elbows - 12/2013. He also had a steroid inj in knee last fall, too.   Pt is on a regimen of: - Glipizide ER 2.5 mg daily - not in last 2 weeks!  - Metformin ER 500 mg 2x a day.  Had nausea, vomiting, loss of appetite with regular metformin.  He was on Actos. He also tried Metformin in the past >> unclear why.  He was on insulin before (Lantus) - came off years ago.  Pt checks his sugars 1-3x a day - they are great: - am: lately 150-170 >> 89-122, 143 >> 96-106, 169 (icecream the night before) >> 93-123 >> 97-128 - 2h after b'fast: 90-120 >> 91-168 >> n/c >> 97-199 (after pancakes) >> 105-140 - before lunch: n/c >> 108 >> 116-137 >> 111-118 >> 82-108 - 2h after lunch: n/c >> 153  >> n/c >> 114-124 >> 150 - before dinner: n/c >> 76-103 >> 61-96, 104 >> 45, 52 (skipped lunch and b'fast), 67-129 >> 69, 84-109 - 2h after dinner: 118-140 >> 60 >> n/c >> 80-153 >> n/c - bedtime: n/c >> 65-103 >> 96 >> 80-112, 183 >> 80, 174 - nighttime: n/c >> 80 Lowest 60 >> 69; he has hypoglycemia awareness at 70.  Highest sugar was 200x1 >> 168 >> 199 >> 174  Glucometer: Freestyle  Pt's meals are: - Breakfast: bacon + eggs, sausage, grits, oatmeal, cereal, toast wheat - Lunch: BLT, soups, fruit, nuts - Dinner: chicken, pork + greens, rice  - Snacks: 1 a day: pretzels; carrots  Walks 3x a week at the [facility].  - + mild CKD, last BUN/creatinine:  05/24/2014: 34/1.64 01/08/2014:  18/1.15 Lab Results  Component Value Date   BUN 16 02/14/2013   CREATININE 1.13 02/14/2013  On Valsartan. - last set of lipids: 04/11/2014: 206/143/70/106 01/08/2014: 161/98/60/73 Lab Results  Component Value Date   CHOL 182 03/18/2012   HDL 53 03/18/2012   LDLCALC 90 03/18/2012   TRIG 193* 03/18/2012   CHOLHDL 3.4 03/18/2012  He is on Crestor 40 mg daily. He will need a generic Crestor Rx. - last eye exam was in 09/2014. No DR. Had cataracts removed 01/09/2015 >> implant in R eye. - no numbness and tingling in his feet.  Last TSH 1.478 (05/24/2014).  ROS: Constitutional: no weight gain/loss, no fatigue, no subjective hyperthermia/hypothermia, no nocturia Eyes: no blurry vision, no xerophthalmia ENT: no sore throat, no nodules palpated in throat, no dysphagia/odynophagia, no hoarseness Cardiovascular: no CP/SOB/palpitations/leg swelling Respiratory: no cough/SOB Gastrointestinal: no N/V/D/C/heartburn, no AP Musculoskeletal: no muscle/joint aches Skin: no rashes Neurological: no tremors/numbness/tingling/dizziness  I reviewed pt's medications, allergies, PMH, social hx, family hx, and changes were documented in the history of present illness. Otherwise, unchanged from my initial visit note:  Past Medical History  Diagnosis Date  . Diabetes mellitus without complication (HCC)   . Hypertension   . Hyperlipidemia   . 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 CKD (chronic kidney disease) stage 2, GFR 60-89  ml/min 03/17/2012  . OSA (obstructive sleep apnea) 03/18/2012    Doing better with CPAP  . 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 Pulmonary hypertension (HCC) 12/10/2010    ECHO:  Mild PH,mild LVH; PA pressures estimated 30-40 mmHg  . 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 History of GI bleed January 2015    No  obvious findings on EGD/colonoscopy   Past Surgical History  Procedure Laterality Date  . Shoulder arthroscopy  2006  . Appendectomy  1974  . Carpal tunnel release    . Hemorrhoid surgery    . Eye surgery  1960    Left eye  . Cardiac catheterization  03/27/2010    Moderate mid RCA lesion,right radial approach,normal EF  . Left heart catheterization with coronary angiogram N/A 03/17/2012    Procedure: LEFT HEART CATHETERIZATION WITH CORONARY ANGIOGRAM;  Surgeon: Marykay Lex, MD;  Location: [facility] CATH LAB;  Service: Cardiovascular;  Laterality: N/A;   History   Social History  . Marital Status: Married    Spouse Name: N/A   Occupational History  . retired   Social History Main Topics  . Smoking status: Former Smoker -- 2.00 packs/day    Quit date: 04/12/2012  . Smokeless tobacco: Never Used  . Alcohol Use: 0.5 oz/week    1 drink(s) per week  . Drug Use: Yes    Special: Marijuana     Comment: cannibus   Social History Narrative   Married, father of 2, grandfather of 3.   He is a former smoker of about a pack to pack and half cigarettes a day -- he quit in March of this year.    He is an avid exerciser working at least 4-5 days a week doing her walking or stationary bike.   Current Outpatient Prescriptions on File Prior to Visit  Medication Sig Dispense Refill  . azithromycin (ZITHROMAX) 250 MG tablet Day 1: Take 2 daily.  Days 2-5: Take 1 daily. 6 tablet 0  . Cholecalciferol (VITAMIN D-3) 1000 UNITS CAPS Take by mouth daily. 2 capsules.    . ferrous sulfate 325 (65 FE) MG tablet Take 1 tablet (325 mg total) by mouth 2 (two) times daily with a meal.  3  . fish oil-omega-3 fatty acids 1000 MG capsule Take 1 g by mouth daily.    Marland Kitchen glipiZIDE (GLUCOTROL XL) 2.5 MG 24 hr tablet Take 1 tablet (2.5 mg total) by mouth daily with breakfast. 90 tablet 0  . glucose blood (FREESTYLE TEST STRIPS) test strip     . metFORMIN (GLUCOPHAGE-XR) 500 MG 24 hr tablet Take 1 tablet (500 mg total)  by mouth 2 (two) times daily with a meal. 120 tablet 1  . Multiple Vitamin (MULTIVITAMIN WITH MINERALS) TABS Take 1 tablet by mouth daily.    . Potassium 99 MG TABS Take 1 tablet by mouth daily.    Marland Kitchen PRESCRIPTION MEDICATION Uses C-PAP at bedtime    . ranitidine (ZANTAC) 300 MG tablet Take 300 mg by mouth at bedtime.    . rosuvastatin (CRESTOR) 40 MG tablet Take 40 mg by mouth daily.    . valsartan-hydrochlorothiazide (DIOVAN-HCT) 320-25 MG tablet TAKE 1 TABLET EVERY DAY 90 tablet 0   No current facility-administered medications on file prior to visit.   No Known Allergies Family History  Problem Relation Age of Onset  . Coronary artery disease Father   . Kidney failure Mother   . Kidney failure Sister    PE: BP 130/62 mmHg  Pulse 75  Temp(Src) 97.6 F (36.4 C) (Oral)  Resp 12  Wt 204 lb 12.8 oz (92.897 kg)  SpO2 97% Body mass index is 32.8 kg/(m^2). Wt Readings from Last 3 Encounters:  01/21/15 204 lb 12.8 oz (92.897 kg)  12/02/14 209 lb (94.802 kg)  10/22/14 204 lb (92.534 kg)   Constitutional: overweight, in NAD Eyes: PERRLA, EOMI, no exophthalmos ENT: moist mucous membranes, no thyromegaly, no cervical lymphadenopathy Cardiovascular: RRR, No MR, + S1 Gallop Respiratory: CTA B Gastrointestinal: abdomen soft, NT, ND, BS+ Musculoskeletal: no deformities, strength intact in all 4 Skin: moist, warm, no rashes Neurological: no tremor with outstretched hands, DTR normal in all 4  ASSESSMENT: 1. DM2, non-insulin-dependent, uncontrolled, with complications - heart ds - ? - cardiologist Dr. Herbie BaltimoreHarding - mild CKD  2. HL  PLAN:  1. Patient with long-standing, uncontrolled diabetes, on oral antidiabetic regimen, with much improved control after adding Metformin XR. At previous visits, we discussed about starting Tradjenta instead of Glipizide but he wanted to see GI first as he had some AP (has seen Dr Ewing SchleinMagod in the past). No current AP. Last HbA1c was great, at 6.5%! -  I  suggested to:  Patient Instructions  Please continue Metformin XR 500 mg 2x a day with meals. Continue Glipizide XL 2.5 mg in am, before breakfast.  Please return in 3 months with your sugar log.   - continue checking sugars at different times of the day - check 2 times a day, rotating checks - UTD with yearly eye exams - check HbA1c today >> 7.1% (higher) - skipped Glipizide for at least 2 weeks - given flu shot at last visit - he is looking for a new PCP as his PCPs office closed - Return to clinic in 3 mo with sugar log   2. HL - review last lipid panel - needs generic Rosuvastatin >> given printed Rx

## 2015-02-13 DIAGNOSIS — H52202 Unspecified astigmatism, left eye: Secondary | ICD-10-CM | POA: Diagnosis not present

## 2015-02-13 DIAGNOSIS — H25012 Cortical age-related cataract, left eye: Secondary | ICD-10-CM | POA: Diagnosis not present

## 2015-02-13 DIAGNOSIS — H25812 Combined forms of age-related cataract, left eye: Secondary | ICD-10-CM | POA: Diagnosis not present

## 2015-02-13 DIAGNOSIS — E119 Type 2 diabetes mellitus without complications: Secondary | ICD-10-CM | POA: Diagnosis not present

## 2015-02-13 DIAGNOSIS — H2512 Age-related nuclear cataract, left eye: Secondary | ICD-10-CM | POA: Diagnosis not present

## 2015-02-17 ENCOUNTER — Telehealth: Payer: Self-pay | Admitting: Internal Medicine

## 2015-02-17 MED ORDER — GLUCOSE BLOOD VI STRP: ORAL_STRIP | Status: DC

## 2015-02-17 NOTE — Telephone Encounter (Signed)
Refill sent to [facility] for pt.

## 2015-02-17 NOTE — Telephone Encounter (Signed)
[facility] to [facility] 200 freestyle test strips

## 2015-03-18 ENCOUNTER — Ambulatory Visit (INDEPENDENT_AMBULATORY_CARE_PROVIDER_SITE_OTHER): Payer: Medicare Other | Admitting: Cardiology

## 2015-03-18 ENCOUNTER — Encounter: Payer: Self-pay | Admitting: Cardiology

## 2015-03-18 VITALS — BP 132/70 | HR 75 | Ht 68.0 in | Wt 197.5 lb

## 2015-03-18 DIAGNOSIS — I2510 Atherosclerotic heart disease of native coronary artery without angina pectoris: Secondary | ICD-10-CM

## 2015-03-18 DIAGNOSIS — E785 Hyperlipidemia, unspecified: Secondary | ICD-10-CM

## 2015-03-18 DIAGNOSIS — I441 Atrioventricular block, second degree: Secondary | ICD-10-CM

## 2015-03-18 DIAGNOSIS — I10 Essential (primary) hypertension: Secondary | ICD-10-CM

## 2015-03-18 DIAGNOSIS — G4733 Obstructive sleep apnea (adult) (pediatric): Secondary | ICD-10-CM

## 2015-03-18 DIAGNOSIS — R011 Cardiac murmur, unspecified: Secondary | ICD-10-CM

## 2015-03-18 DIAGNOSIS — E669 Obesity, unspecified: Secondary | ICD-10-CM

## 2015-03-18 NOTE — Progress Notes (Signed)
PCP: No PCP Per Patient  Clinic Note: Chief Complaint  Patient presents with  . Annual Exam    pt c/o fatigue and occasional dizziness when changing positions--bending down to standing  . Heart Murmur    HPI: James Jones is a 70 y.o. male with a PMH below who presents today for delayed f/u of Non-obstructive CAD & cardiac RFs including DM-2, HTN. HLD.  Sipriano Fendley was last seen in Sept 2015 - only complaint was GERD /heartburn.  Recent Hospitalizations: n/a Studies Reviewed: n/a  PCP heard murmur & that prompted his return  Interval History: James Jones is doing well without any major complaints. He has occasional positional dizziness, but no active cardiac symptoms. He goes to the [facility] about 3 days a week and walks about 3 miles and does cardiovascular exercises. He really denies any exertional dyspnea or chest tightness/pressure. Despite this he just Feels like he may tire with easier than usual. He lost his appetite some. He also notes that he needs to adjust his CPAP settings. No heart failure symptoms of PND, orthopnea or edema.  No palpitations, lightheadedness, , weakness or syncope/near syncope. No TIA/amaurosis fugax symptoms.  No claudication.  ROS: A comprehensive was performed. Review of Systems  Constitutional: Positive for malaise/fatigue (Mild exertional fatigue. Feels tired during the day).  HENT: Negative for nosebleeds.   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  Respiratory: Negative for cough, shortness of breath and wheezing.   Gastrointestinal: Positive for heartburn. Negative for blood in stool and melena.  Genitourinary: Negative for hematuria.  Musculoskeletal: Positive for joint pain.  Neurological: Positive for dizziness (positional). Negative for headaches.  Endo/Heme/Allergies: Does not bruise/bleed easily.  Psychiatric/Behavioral: Negative for depression.       Difficult sleep with CPAP  All other systems reviewed and are  negative.    Past Medical History  Diagnosis Date  . Diabetes mellitus without complication (HCC)   . Hypertension   . Hyperlipidemia   . 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 OSA (obstructive sleep apnea) 03/18/2012    Doing better with CPAP  . 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 Pulmonary hypertension (HCC) 12/10/2010    ECHO:  Mild PH,mild LVH; PA pressures estimated 30-40 mmHg  . 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 History of GI bleed January 2015    No obvious findings on EGD/colonoscopy    Past Surgical History  Procedure Laterality Date  . Shoulder arthroscopy  2006  . Appendectomy  1974  . Carpal tunnel release    . Hemorrhoid surgery    . Eye surgery  1960    Left eye  . Cardiac catheterization  03/27/2010    Moderate mid RCA lesion,right radial approach,normal EF  . Left heart catheterization with coronary angiogram N/A 03/17/2012    Procedure: LEFT HEART CATHETERIZATION WITH CORONARY ANGIOGRAM;  Surgeon: Marykay Lex, MD;  Location: [facility] CATH LAB;  Service: Cardiovascular;  Laterality: N/A;    Prior to Admission medications   Medication Sig Start Date End Date Taking? Authorizing Provider  Cholecalciferol (VITAMIN D-3) 1000 UNITS CAPS Take by mouth daily. 2 capsules.   Yes Historical Provider, MD  DUREZOL 0.05 % EMUL  01/05/15  Yes Historical Provider, MD  ferrous sulfate 325 (65 FE) MG tablet Take 1 tablet (325 mg total) by mouth 2 (two) times daily with a meal. 02/14/13  Yes Christiane Ha, MD  fish oil-omega-3 fatty acids 1000 MG capsule Take 1 g by mouth daily.   Yes Historical Provider, MD  glipiZIDE (GLUCOTROL XL) 2.5 MG 24 hr tablet Take 1 tablet (2.5 mg total) by mouth daily with breakfast. 10/09/14  Yes Carlus Pavlov, MD  glucose blood (FREESTYLE TEST STRIPS) test strip Use to test blood sugar 2 times daily as instructed. Dx: E11.59 02/17/15  Yes Carlus Pavlov, MD  metFORMIN (GLUCOPHAGE-XR) 500 MG 24 hr tablet Take 1 tablet (500 mg total) by mouth 2 (two) times daily with a meal. 07/16/14  Yes Carlus Pavlov, MD  Multiple Vitamin (MULTIVITAMIN WITH MINERALS) TABS Take 1 tablet by mouth daily.   Yes Historical Provider, MD  Potassium 99 MG TABS Take 1 tablet by mouth daily.   Yes Historical Provider, MD  PRESCRIPTION MEDICATION Uses C-PAP at bedtime   Yes Historical Provider, MD  ranitidine (ZANTAC) 300 MG tablet Take 300 mg by mouth at bedtime.   Yes Historical Provider, MD  rosuvastatin (CRESTOR) 40 MG tablet Take 1 tablet (40 mg total) by mouth daily. 01/21/15  Yes Carlus Pavlov, MD  valsartan-hydrochlorothiazide (DIOVAN-HCT) 320-25 MG tablet TAKE 1 TABLET EVERY DAY 01/06/15  Yes Carlus Pavlov, MD  VIGAMOX 0.5 % ophthalmic solution  01/05/15  Yes Historical Provider, MD   No Known Allergies  Social History   Social History  . Marital Status: Married    Spouse Name: N/A  . Number of Children: N/A  . Years of Education: N/A   Social History Main Topics  . Smoking status: Former Smoker -- 1.00 packs/day    Types: Cigarettes    Quit date: 04/12/2012  . Smokeless tobacco: Never Used  . Alcohol Use: 0.6 oz/week    1 Standard drinks or equivalent per week  . Drug Use: Yes    Special: Marijuana     Comment: cannibus  . Sexual Activity: Not Asked   Other Topics Concern  . None   Social History Narrative   Married, father of 2, grandfather of 3.   He is a former smoker of about a pack to pack and half cigarettes a day -- he quit in March of this year.    He is an avid exerciser working at least 4-5 days a week doing her walking or stationary bike.   Family History  Problem Relation Age of Onset  . Coronary artery disease Father   . Kidney failure Mother   . Kidney failure  Sister     Wt Readings from Last 3 Encounters:  03/18/15 197 lb 8 oz (89.585 kg)  01/21/15 204 lb 12.8 oz (92.897 kg)  12/02/14 209 lb (94.802 kg)    PHYSICAL EXAM BP 132/70 mmHg  Pulse 75  Ht  (1.727 m)  Wt 197 lb 8 oz (89.585 kg)  BMI 30.04 kg/m2 General appearance: alert, cooperative, appears stated age, no distress, mildly obese and Otherwise healthy-appearing. Well-nourished and well-groomed. Answers questions appropriately.  Neck: no adenopathy, no carotid bruit, no JVD, supple, symmetrical, trachea midline and thyroid not enlarged, symmetric, no tenderness/mass/nodules  Lungs: clear to auscultation bilaterally, normal percussion bilaterally and Nonlabored, and good air movement.  Heart: regular rate and rhythm, S1 normal, Split S2  with soft SEM.  no click, rub or gallop and normal apical impulse  Abdomen: soft, non-tender; bowel sounds normal;  no masses, no organomegaly  Extremities: extremities normal, atraumatic, no cyanosis or edema  Pulses: 2+ and symmetric  Neurologic: Grossly normal   Adult ECG Report  Rate: 75 ;  Rhythm: normal sinus rhythm and RBBB. Otherwise normal axis, intervals and durations.;   Narrative Interpretation: Stable EKG from 2015   Other studies Reviewed: Additional studies/ records that were reviewed today include:  Recent Labs:  No new labs available   ASSESSMENT / PLAN: Problem List Items Addressed This Visit    OSA (obstructive sleep apnea), has been cpap intolerant (Chronic)    He has been having issues again with his CPAP machine. I need to get him back in discussion with Dr. Tresa Endo or his assistant to see if there is any additional adjustments were recommended that needs to be ordered. I'm not sure if he actually needs to be seen by them. I will for this note to Dr. Tresa Endo and his assistant      Obesity (BMI 30-39.9); 32.6 on 10/02/2012 (Chronic)    He actually has lost about 12 pounds since I he was seen in October and another  clinic. He does feel better and has more energy. He is working out quite a bit. Over the pelvis cholesterol and blood pressure.      Murmur, cardiac    I barely hear a soft murmur. Pulmonary related to the split S2 from bundle branch block. 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  Unless the murmur becomes louder or he becomes symptomatic, I would hold off on further echoes.      Essential hypertension    Well-controlled today on ARB.      Dyslipidemia, goal LDL below 70 (Chronic)    Labs none available. Monitored by PCP. He is on Crestor with no myalgias. With his existing CAD, goal for LDL should be less than 70.      Relevant Orders   EKG 12-Lead (Completed)   CAD (coronary artery disease), with 60-70% stenosis in RCA - Primary (Chronic)    Still has no active anginal symptoms. Not on beta blocker due to history of bradycardia. He is on Diovan. Also on statin with labs monitored by PCP.  He is very active with exercising and has not had any anginal symptoms. I would continue to monitor on medical management.      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 (+/- concerns) none NO CHANGE IN CURRENT MEDICATIONS  PLEASE HAVE YOUR PRIMARY DOCTOR SEND A COPY OF CHOLESTEROL LEVELS.  Your physician wants you to follow-up in 12 MONTHS WITH DR Shiya Fogelman.The following changes have been made: none  Studies Ordered:   Orders Placed This Encounter  Procedures  . EKG 12-Lead      Marykay Lex, M.D., M.S. Interventional Cardiologist   Pager # [phone] Phone # [phone] [address] [place], [place] [address]

## 2015-03-18 NOTE — Patient Instructions (Signed)
NO CHANGE IN CURRENT MEDICATIONS  PLEASE HAVE YOUR PRIMARY DOCTOR SEND A COPY OF CHOLESTEROL LEVELS.  Your physician wants you to follow-up in 12 MONTHS WITH DR HARDING.  You will receive a reminder letter in the mail two months in advance. If you don't receive a letter, please call our office to schedule the follow-up appointment.   If you need a refill on your cardiac medications before your next appointment, please call your pharmacy.

## 2015-03-20 ENCOUNTER — Encounter: Payer: Self-pay | Admitting: Cardiology

## 2015-03-20 NOTE — Assessment & Plan Note (Signed)
Well-controlled today on ARB.

## 2015-03-20 NOTE — Assessment & Plan Note (Signed)
He actually has lost about 12 pounds since I he was seen in October and another clinic. He does feel better and has more energy. He is working out quite a bit. Over the pelvis cholesterol and blood pressure.

## 2015-03-20 NOTE — Assessment & Plan Note (Signed)
Labs none available. Monitored by PCP. He is on Crestor with no myalgias. With his existing CAD, goal for LDL should be less than 70.

## 2015-03-20 NOTE — Assessment & Plan Note (Signed)
I barely hear a soft murmur. Pulmonary related to the split S2 from bundle branch block. 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  Unless the murmur becomes louder or he becomes symptomatic, I would hold off on further echoes.

## 2015-03-20 NOTE — Assessment & Plan Note (Signed)
He has been having issues again with his CPAP machine. I need to get him back in discussion with Dr. Tresa Endo or his assistant to see if there is any additional adjustments were recommended that needs to be ordered. I'm not sure if he actually needs to be seen by them. I will for this note to Dr. Tresa Endo and his assistant

## 2015-03-20 NOTE — Assessment & Plan Note (Signed)
Still has no active anginal symptoms. Not on beta blocker due to history of bradycardia. He is on Diovan. Also on statin with labs monitored by PCP.  He is very active with exercising and has not had any anginal symptoms. I would continue to monitor on medical management.

## 2015-03-31 DIAGNOSIS — H04123 Dry eye syndrome of bilateral lacrimal glands: Secondary | ICD-10-CM | POA: Diagnosis not present

## 2015-03-31 DIAGNOSIS — H01001 Unspecified blepharitis right upper eyelid: Secondary | ICD-10-CM | POA: Diagnosis not present

## 2015-03-31 DIAGNOSIS — H01002 Unspecified blepharitis right lower eyelid: Secondary | ICD-10-CM | POA: Diagnosis not present

## 2015-04-03 ENCOUNTER — Other Ambulatory Visit: Payer: Self-pay | Admitting: Internal Medicine

## 2015-04-05 ENCOUNTER — Other Ambulatory Visit: Payer: Self-pay | Admitting: Internal Medicine

## 2015-04-09 DIAGNOSIS — N281 Cyst of kidney, acquired: Secondary | ICD-10-CM | POA: Diagnosis not present

## 2015-04-09 DIAGNOSIS — M109 Gout, unspecified: Secondary | ICD-10-CM | POA: Diagnosis not present

## 2015-04-09 DIAGNOSIS — N182 Chronic kidney disease, stage 2 (mild): Secondary | ICD-10-CM | POA: Diagnosis not present

## 2015-04-21 ENCOUNTER — Ambulatory Visit: Payer: Medicare Other | Admitting: Internal Medicine

## 2015-04-25 ENCOUNTER — Other Ambulatory Visit (INDEPENDENT_AMBULATORY_CARE_PROVIDER_SITE_OTHER): Payer: Medicare Other | Admitting: *Deleted

## 2015-04-25 ENCOUNTER — Encounter: Payer: Self-pay | Admitting: Internal Medicine

## 2015-04-25 ENCOUNTER — Ambulatory Visit (INDEPENDENT_AMBULATORY_CARE_PROVIDER_SITE_OTHER): Payer: Medicare Other | Admitting: Internal Medicine

## 2015-04-25 VITALS — BP 124/68 | HR 70 | Temp 98.2°F | Resp 12 | Wt 209.0 lb

## 2015-04-25 DIAGNOSIS — I2510 Atherosclerotic heart disease of native coronary artery without angina pectoris: Secondary | ICD-10-CM

## 2015-04-25 DIAGNOSIS — E041 Nontoxic single thyroid nodule: Secondary | ICD-10-CM | POA: Diagnosis not present

## 2015-04-25 DIAGNOSIS — E1159 Type 2 diabetes mellitus with other circulatory complications: Secondary | ICD-10-CM

## 2015-04-25 LAB — POCT GLYCOSYLATED HEMOGLOBIN (HGB A1C): HEMOGLOBIN A1C: 6.5

## 2015-04-25 MED ORDER — METFORMIN HCL ER 500 MG PO TB24: ORAL_TABLET | ORAL | Status: DC

## 2015-04-25 NOTE — Patient Instructions (Addendum)
Please continue Metformin XR 500 mg 2x a day with meals. Continue Glipizide XL 2.5 mg in am, before breakfast.  Please return in 3 months with your sugar log.   Please let me know if you are not called with the thyroid U/S schedule in 1 week.  Your HbA1c today is: 6.5%!

## 2015-04-25 NOTE — Progress Notes (Signed)
Patient ID: James Jones, male   DOB: 02-11-1945, 70 y.o.   MRN: [id number]  HPI: James Jones is a 70 y.o.-year-old male, returning for f/u for DM2, dx in ~2005, non-insulin-dependent, uncontrolled, with complications (heart ds., mild CKD). Last visit 3 mo ago. PCP:Dr. Shanon PayorStewart Cooper; PA Haze Rushingeborah Cobb  Last hemoglobin A1c was: Lab Results  Component Value Date   HGBA1C 7.1 01/21/2015   HGBA1C 6.5 10/22/2014   HGBA1C 6.6* 07/16/2014  04/11/2014: HbA1c 8.4% 01/08/2014: HbA1c 7.7% He had steroid inj for gout in his elbows - 12/2013. He also had a steroid inj in knee last fall, too.   Pt is on a regimen of: - Glipizide ER 2.5 mg daily - Metformin ER 500 mg 2x a day.  Had nausea, vomiting, loss of appetite with regular metformin.  He was on Actos. He also tried Metformin in the past >> unclear why.  He was on insulin before (Lantus) - came off years ago.  Pt checks his sugars 1-3x a day: - am: lately 150-170 >> 89-122, 143 >> 96-106, 169 (icecream the night before) >> 93-123 >> 97-128 >> 79-115, 128 - 2h after b'fast: 90-120 >> 91-168 >> n/c >> 97-199 (after pancakes) >> 105-140 >> 91, 153 - before lunch: n/c >> 108 >> 116-137 >> 111-118 >> 82-108 >> 77, 100-138, 168 (candy) - 2h after lunch: n/c >> 153  >> n/c >> 114-124 >> 150 >> 77-126, 163 - before dinner: n/c >> 76-103 >> 61-96, 104 >> 45, 52 (skipped lunch and b'fast), 67-129 >> 69, 84-109 >> 66-118, 151x1 - 2h after dinner: 118-140 >> 60 >> n/c >> 80-153 >> n/c >> 68-117 - bedtime: n/c >> 65-103 >> 96 >> 80-112, 183 >> 80, 174 >> 67, 97, 172 - nighttime: n/c >> 80 Lowest 60 >> 69 >> 66 - delayed meals; he has hypoglycemia awareness at 70.  Highest sugar was 200x1 >> 168 >> 199 >> 174 >> 172x1  Glucometer: Freestyle  Pt's meals are: - Breakfast: bacon + eggs, sausage, grits, oatmeal, cereal, toast wheat - Lunch: BLT, soups, fruit, nuts - Dinner: chicken, pork + greens, rice  - Snacks: 1 a day: pretzels; carrots  [facility]  - + mild CKD, last BUN/creatinine:  05/24/2014: 34/1.64 01/08/2014: 18/1.15 Lab Results  Component Value Date   BUN 16 02/14/2013   CREATININE 1.13 02/14/2013  On Valsartan. - last set of lipids: 04/11/2014: 206/143/70/106 01/08/2014: 161/98/60/73 Lab Results  Component Value Date   CHOL 182 03/18/2012   HDL 53 03/18/2012   LDLCALC 90 03/18/2012   TRIG 193* 03/18/2012   CHOLHDL 3.4 03/18/2012  He is on Crestor 40 mg daily. He will need a generic Crestor Rx. - last eye exam was in 09/2014. No DR. Had cataracts removed 01/09/2015 >> implant in R eye. - no numbness and tingling in his feet.  Last TSH 1.478 (05/24/2014).  ROS: Constitutional: no weight gain/loss, no fatigue, no subjective hyperthermia/hypothermia, no nocturia Eyes: no blurry vision, no xerophthalmia ENT: no sore throat, no nodules palpated in throat, no dysphagia/odynophagia, no hoarseness Cardiovascular: no CP/SOB/palpitations/leg swelling Respiratory: no cough/SOB Gastrointestinal: no N/V/D/C/heartburn, no AP Musculoskeletal: no muscle/joint aches Skin: no rashes Neurological: no tremors/numbness/tingling/dizziness  I reviewed pt's medications, allergies, PMH, social hx, family hx, and changes were documented in the history of present illness. Otherwise, unchanged from my initial visit note:  Past Medical History  Diagnosis Date  . Diabetes mellitus without complication (HCC)   . Hypertension   .  Hyperlipidemia   . 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 OSA (obstructive sleep apnea) 03/18/2012    Doing better with CPAP  . 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 Pulmonary hypertension (HCC) 12/10/2010    ECHO:  Mild PH,mild LVH; PA pressures estimated 30-40 mmHg  . Wenckebach 04/09/2012-05/09/2012    Event monitor; usually  during sleeping hours; therefore not on beta blocker  . History of GI bleed January 2015    No obvious findings on EGD/colonoscopy   Past Surgical History  Procedure Laterality Date  . Shoulder arthroscopy  2006  . Appendectomy  1974  . Carpal tunnel release    . Hemorrhoid surgery    . Eye surgery  1960    Left eye  . Cardiac catheterization  03/27/2010    Moderate mid RCA lesion,right radial approach,normal EF  . Left heart catheterization with coronary angiogram N/A 03/17/2012    Procedure: LEFT HEART CATHETERIZATION WITH CORONARY ANGIOGRAM;  Surgeon: Marykay Lex, MD;  Location: [facility] CATH LAB;  Service: Cardiovascular;  Laterality: N/A;   History   Social History  . Marital Status: Married    Spouse Name: N/A   Occupational History  . retired   Social History Main Topics  . Smoking status: Former Smoker -- 2.00 packs/day    Quit date: 04/12/2012  . Smokeless tobacco: Never Used  . Alcohol Use: 0.5 oz/week    1 drink(s) per week  . Drug Use: Yes    Special: Marijuana     Comment: cannibus   Social History Narrative   Married, father of 2, grandfather of 3.   He is a former smoker of about a pack to pack and half cigarettes a day -- he quit in March of this year.    He is an avid exerciser working at least 4-5 days a week doing her walking or stationary bike.   Current Outpatient Prescriptions on File Prior to Visit  Medication Sig Dispense Refill  . Cholecalciferol (VITAMIN D-3) 1000 UNITS CAPS Take by mouth daily. 2 capsules.    . ferrous sulfate 325 (65 FE) MG tablet Take 1 tablet (325 mg total) by mouth 2 (two) times daily with a meal.  3  . fish oil-omega-3 fatty acids 1000 MG capsule Take 1 g by mouth daily.    Marland Kitchen GLIPIZIDE XL 2.5 MG 24 hr tablet TAKE 1 TABLET EVERY DAY 90 tablet 0  . glucose blood (FREESTYLE TEST STRIPS) test strip Use to test blood sugar 2 times daily as instructed. Dx: E11.59 200 each 1  . metFORMIN (GLUCOPHAGE-XR) 500 MG 24 hr tablet TAKE 2  TABLETS BY MOUTH TWICE A DAY WITH A MEAL 120 tablet 1  . Multiple Vitamin (MULTIVITAMIN WITH MINERALS) TABS Take 1 tablet by mouth daily.    . Potassium 99 MG TABS Take 1 tablet by mouth daily.    Marland Kitchen PRESCRIPTION MEDICATION Uses C-PAP at bedtime    . ranitidine (ZANTAC) 300 MG tablet Take 300 mg by mouth at bedtime.    . rosuvastatin (CRESTOR) 40 MG tablet Take 1 tablet (40 mg total) by mouth daily. 90 tablet 3  . valsartan-hydrochlorothiazide (DIOVAN-HCT) 320-25 MG tablet TAKE 1 TABLET EVERY DAY 90 tablet 0   No current facility-administered medications on file prior to visit.   No Known Allergies Family  History  Problem Relation Age of Onset  . Coronary artery disease Father   . Kidney failure Mother   . Kidney failure Sister    PE: BP 124/68 mmHg  Pulse 70  Temp(Src) 98.2 F (36.8 C) (Oral)  Resp 12  Wt 209 lb (94.802 kg)  SpO2 97% Body mass index is 31.79 kg/(m^2). Wt Readings from Last 3 Encounters:  04/25/15 209 lb (94.802 kg)  03/18/15 197 lb 8 oz (89.585 kg)  01/21/15 204 lb 12.8 oz (92.897 kg)   Constitutional: overweight, in NAD Eyes: PERRLA, EOMI, no exophthalmos ENT: moist mucous membranes, no thyromegaly, But there is a small isthmic thyroid nodule on palpation of his anterior neck , no cervical lymphadenopathy Cardiovascular: RRR, No MRG Respiratory: CTA B Gastrointestinal: abdomen soft, NT, ND, BS+ Musculoskeletal: no deformities, strength intact in all 4 Skin: moist, warm, no rashes Neurological: no tremor with outstretched hands, DTR normal in all 4  ASSESSMENT: 1. DM2, non-insulin-dependent, uncontrolled, with complications - heart ds - ? - cardiologist Dr. Herbie Baltimore - mild CKD  - At previous visits, we discussed about starting Tradjenta instead of Glipizide but he wanted to see GI first as he had some AP (has seen Dr Ewing Schlein in the past). No current AP.   2. Thyroid nodule  PLAN:  1. Patient with long-standing, uncontrolled diabetes, on oral  antidiabetic regimen, with much improved control after adding Metformin XR.  Last HbA1c was higher, increased from 6.5 to 7.1% as he was skipping Glipizide. He is now taking it distantly and his sugars are at goal. -  I suggested to:  Patient Instructions  Please continue Metformin XR 500 mg 2x a day with meals. Continue Glipizide XL 2.5 mg in am, before breakfast.  Please return in 3 months with your sugar log.   - continue checking sugars at different times of the day - check 1-2 times a day, rotating checks - UTD with yearly eye exams - check HbA1c today >> 6.5% (better!) - given flu shot this season - He will have a new lipid panel at the next appointment with PCP - Return to clinic in 3 mo with sugar log   2. Thyroid nodule on palpation - There is a small isthmic thyroid nodule on palpation - Patient agrees for thyroid ultrasound - Reviewed previous TSH level, normal: 1.478 on 05/24/2014 - in Care Everywhere  CLINICAL DATA: 70 year old male with a history of possible nodule on physical exam  EXAM: THYROID ULTRASOUND  TECHNIQUE: Ultrasound examination of the thyroid gland and adjacent soft tissues was performed.  COMPARISON: None.  FINDINGS: Right thyroid lobe  Measurements: 3.6 cm x 2.6 cm x 1.9 cm. No nodules visualized.  Left thyroid lobe  Measurements: 4.1 cm x 2.4 cm x 1.7 cm. No nodules visualized.  Isthmus  Thickness: 4 mm. No nodules visualized.  Lymphadenopathy  None visualized.  IMPRESSION: Unremarkable sonographic survey of the thyroid.  Signed,  Yvone Neu. Loreta Ave, DO  [facility]  [facility]   Electronically Signed By: Gilmer Mor D.O. On: 04/29/2015 16:42  No thyroid nodules on ultrasound.

## 2015-04-29 ENCOUNTER — Ambulatory Visit
Admission: RE | Admit: 2015-04-29 | Discharge: 2015-04-29 | Disposition: A | Discharge status: Home / Self Care | Payer: Medicare Other | Source: Ambulatory Visit | Attending: Internal Medicine | Admitting: Internal Medicine

## 2015-04-29 ENCOUNTER — Other Ambulatory Visit: Payer: Self-pay | Admitting: Internal Medicine

## 2015-04-29 DIAGNOSIS — R590 Localized enlarged lymph nodes: Secondary | ICD-10-CM | POA: Diagnosis not present

## 2015-06-05 ENCOUNTER — Encounter: Payer: Self-pay | Admitting: Family Medicine

## 2015-06-05 ENCOUNTER — Ambulatory Visit (INDEPENDENT_AMBULATORY_CARE_PROVIDER_SITE_OTHER): Payer: Medicare Other | Admitting: Family Medicine

## 2015-06-05 VITALS — BP 110/60 | HR 80 | Temp 98.7°F | Resp 16 | Ht 69.0 in | Wt 211.0 lb

## 2015-06-05 DIAGNOSIS — R1032 Left lower quadrant pain: Secondary | ICD-10-CM | POA: Diagnosis not present

## 2015-06-05 DIAGNOSIS — I2510 Atherosclerotic heart disease of native coronary artery without angina pectoris: Secondary | ICD-10-CM | POA: Diagnosis not present

## 2015-06-05 LAB — URINALYSIS, ROUTINE W REFLEX MICROSCOPIC
BILIRUBIN URINE: NEGATIVE
Glucose, UA: NEGATIVE
Hgb urine dipstick: NEGATIVE
Leukocytes, UA: NEGATIVE
Nitrite: NEGATIVE
PH: 5.5 (ref 5.0–8.0)
Specific Gravity, Urine: 1.015 (ref 1.001–1.035)

## 2015-06-05 LAB — URINALYSIS, MICROSCOPIC ONLY
Bacteria, UA: NONE SEEN [HPF]
Casts: NONE SEEN [LPF]
Crystals: NONE SEEN [HPF]
RBC / HPF: NONE SEEN RBC/HPF
WBC, UA: NONE SEEN WBC/HPF
Yeast: NONE SEEN [HPF]

## 2015-06-05 MED ORDER — OXYCODONE-ACETAMINOPHEN 5-325 MG PO TABS: 1.0000 | ORAL_TABLET | ORAL | Status: DC | PRN

## 2015-06-05 MED ORDER — TAMSULOSIN HCL 0.4 MG PO CAPS: 0.4000 mg | ORAL_CAPSULE | Freq: Every day | ORAL | Status: DC

## 2015-06-05 NOTE — Progress Notes (Signed)
Subjective:    Patient ID: James Jones, male    DOB: 1945/03/09, [age] y.o.   MRN: [id number]  HPI Patient reports pain for the last 3 days. The pain begins in the left lower back/left flank area. It comes and goes in waves. It radiates to his left lower quadrant suprapubic area. There are no exacerbating or alleviating factors. Movement does not affect the pain. Nothing he does seems to trigger or alleviate the pain. The pain is not reproducible on exam. There is no tenderness to palpation in the left lower back or in the right flank. I'm unable to reproduce the pain on exam. He has no CVA tenderness. His abdomen is soft nondistended nontender with normal bowel sounds. Past medical history is significant for a GI bleed with a hemoglobin approximately 8 in 2015. CT scan was obtained at that time which revealed no pathologic process in the abdomen. There was no evidence of a AAA mentioned on the CT scan report. Patient denies any fevers or recent illnesses. Family history is significant for pancreatic cancer. His brother recently died from pancreatic cancer with similar symptoms which I was as the patient concerned Past Medical History  Diagnosis Date  . Diabetes mellitus without complication (HCC)   . Hypertension   . Hyperlipidemia   . 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 OSA (obstructive sleep apnea) 03/18/2012    Doing better with CPAP  . 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 Pulmonary hypertension (HCC) 12/10/2010    ECHO:  Mild PH,mild LVH; PA pressures estimated 30-40 mmHg  . 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 History of GI bleed January 2015    No obvious findings on EGD/colonoscopy   Past Surgical History  Procedure Laterality Date  . Shoulder  arthroscopy  2006  . Appendectomy  1974  . Carpal tunnel release    . Hemorrhoid surgery    . Eye surgery  1960    Left eye  . Cardiac catheterization  03/27/2010    Moderate mid RCA lesion,right radial approach,normal EF  . Left heart catheterization with coronary angiogram N/A 03/17/2012    Procedure: LEFT HEART CATHETERIZATION WITH CORONARY ANGIOGRAM;  Surgeon: James Lexavid W Harding, MD;  [facility] LAB;  Service: Cardiovascular;  Laterality: N/A;   Current Outpatient Prescriptions on File Prior to Visit  Medication Sig Dispense Refill  . Cholecalciferol (VITAMIN D-3) 1000 UNITS CAPS Take by mouth daily. 2 capsules.    . ferrous sulfate 325 (65 FE) MG tablet Take 1 tablet (325 mg total) by mouth 2 (two) times daily with a meal.  3  . fish oil-omega-3 fatty acids 1000 MG capsule Take 1 g by mouth daily.    Marland Kitchen. GLIPIZIDE XL 2.5 MG 24 hr tablet TAKE 1 TABLET EVERY DAY 90 tablet 0  . glucose blood (FREESTYLE TEST STRIPS) test strip Use to test blood sugar 2 times daily as instructed. Dx: E11.59 200 each 1  . metFORMIN (GLUCOPHAGE-XR) 500 MG 24 hr tablet TAKE 2 TABLETS BY MOUTH TWICE A DAY WITH A MEAL 360 tablet 1  . Multiple Vitamin (MULTIVITAMIN WITH MINERALS) TABS Take 1 tablet by mouth daily.    . Potassium 99 MG TABS Take 1 tablet by mouth daily.    .Marland Kitchen  PRESCRIPTION MEDICATION Uses C-PAP at bedtime    . ranitidine (ZANTAC) 300 MG tablet Take 300 mg by mouth at bedtime.    . rosuvastatin (CRESTOR) 40 MG tablet Take 1 tablet (40 mg total) by mouth daily. 90 tablet 3  . valsartan-hydrochlorothiazide (DIOVAN-HCT) 320-25 MG tablet TAKE 1 TABLET EVERY DAY 90 tablet 0   No current facility-administered medications on file prior to visit.   No Known Allergies Social History   Social History  . Marital Status: Married    Spouse Name: N/A  . Number of Children: N/A  . Years of Education: N/A   Occupational History  . Not on file.   Social History Main Topics  . Smoking status: Former  Smoker -- 1.00 packs/day    Types: Cigarettes    Quit date: 04/12/2012  . Smokeless tobacco: Never Used  . Alcohol Use: 0.6 oz/week    1 Standard drinks or equivalent per week  . Drug Use: Yes    Special: Marijuana     Comment: cannibus  . Sexual Activity: Not on file   Other Topics Concern  . Not on file   Social History Narrative   Married, father of 2, grandfather of 3.   He is a former smoker of about a pack to pack and half cigarettes a day -- he quit in March of this year.    He is an avid exerciser working at least 4-5 days a week doing her walking or stationary bike.      Review of Systems  All other systems reviewed and are negative.      Objective:   Physical Exam  Constitutional: He appears well-developed and well-nourished.  Neck: Neck supple. No JVD present. No thyromegaly present.  Cardiovascular: Normal rate, regular rhythm, normal heart sounds and intact distal pulses.   No murmur heard. Pulmonary/Chest: Effort normal and breath sounds normal. No respiratory distress. He has no wheezes. He has no rales.  Abdominal: Soft. Bowel sounds are normal. He exhibits no distension and no mass. There is no tenderness. There is no rebound and no guarding.  Musculoskeletal: He exhibits no edema.  Lymphadenopathy:    He has no cervical adenopathy.  Vitals reviewed.         Assessment & Plan:  Colicky LLQ abdominal pain - Plan: Urinalysis, Routine w reflex microscopic (not at [facility]), CBC with Differential/Platelet, COMPLETE METABOLIC PANEL WITH GFR, Lipase  Patient's physical exam is unremarkable. His history sounds suspicious for a possible kidney stone given the colicky nature of the pain in the radiation of the pain from his lower back to his pelvic area. He also reports severe nausea due to the pain. This morning he woke up and decide on the toilet feeling like he needed to vomit until the pain subsided. This is concerning for possible kidney stone although he  denies any gross hematuria. I'll begin by obtaining a urinalysis to evaluate for hematuria. I will also obtain a CBC CMP and a lipase to evaluate for other possible causes or signs of abdominal pain. If workup is unremarkable, I would next proceed to a CT scan of the abdomen and pelvis given the severity of the pain. At the present time the patient's pain is 3 on a scale of 10.  Urinalysis shows no blood in it does show some protein and ketones. I want to proceed with lab work and schedule the patient for a CT scan of the abdomen and pelvis to evaluate further. I believe based on  his history was most likely kidney stone. Based on his benign abdominal exam I do not believe his diverticulitis. I will have the patient start taking Flomax 0.4 mg by mouth daily and Percocet 5/325 one every 6 hours as needed for pain. Should the pain worsen I will go to the emergency room. Meanwhile try to have the patient scheduled for a CT scan tomorrow. I recommended that he discontinue metformin.

## 2015-06-06 ENCOUNTER — Other Ambulatory Visit: Payer: Medicare Other

## 2015-06-06 DIAGNOSIS — R1032 Left lower quadrant pain: Secondary | ICD-10-CM | POA: Diagnosis not present

## 2015-06-06 LAB — COMPLETE METABOLIC PANEL WITH GFR
ALT: 18 U/L (ref 9–46)
AST: 25 U/L (ref 10–35)
Albumin: 4.5 g/dL (ref 3.6–5.1)
Alkaline Phosphatase: 51 U/L (ref 40–115)
BUN: 30 mg/dL — AB (ref 7–25)
CALCIUM: 9.6 mg/dL (ref 8.6–10.3)
CHLORIDE: 102 mmol/L (ref 98–110)
CO2: 20 mmol/L (ref 20–31)
CREATININE: 1.8 mg/dL — AB (ref 0.70–1.18)
GFR, Est African American: 43 mL/min — ABNORMAL LOW (ref >=60)
GFR, Est Non African American: 37 mL/min — ABNORMAL LOW (ref >=60)
Glucose, Bld: 93 mg/dL (ref 70–99)
POTASSIUM: 4.2 mmol/L (ref 3.5–5.3)
SODIUM: 136 mmol/L (ref 135–146)
Total Bilirubin: 0.4 mg/dL (ref 0.2–1.2)
Total Protein: 6.7 g/dL (ref 6.1–8.1)

## 2015-06-06 LAB — CBC WITH DIFFERENTIAL/PLATELET
BASOS PCT: 0 %
Basophils Absolute: 0 cells/uL (ref 0–200)
EOS PCT: 1 %
Eosinophils Absolute: 78 cells/uL (ref 15–500)
HCT: 34.4 % — ABNORMAL LOW (ref 38.5–50.0)
Hemoglobin: 11.3 g/dL — ABNORMAL LOW (ref 13.0–17.0)
LYMPHS PCT: 20 %
Lymphs Abs: 1560 cells/uL (ref 850–3900)
MCH: 27.2 pg (ref 27.0–33.0)
MCHC: 32.8 g/dL (ref 32.0–36.0)
MCV: 82.9 fL (ref 80.0–100.0)
MONO ABS: 468 {cells}/uL (ref 200–950)
MONOS PCT: 6 %
MPV: 8.8 fL (ref 7.5–12.5)
NEUTROS PCT: 73 %
Neutro Abs: 5694 cells/uL (ref 1500–7800)
PLATELETS: 234 10*3/uL (ref 140–400)
RBC: 4.15 MIL/uL — AB (ref 4.20–5.80)
RDW: 14.5 % (ref 11.0–15.0)
WBC: 7.8 10*3/uL (ref 3.8–10.8)

## 2015-06-06 LAB — LIPASE: LIPASE: 140 U/L — AB (ref 7–60)

## 2015-06-10 ENCOUNTER — Ambulatory Visit
Admission: RE | Admit: 2015-06-10 | Discharge: 2015-06-10 | Disposition: A | Discharge status: Home / Self Care | Payer: Medicare Other | Source: Ambulatory Visit | Attending: Family Medicine | Admitting: Family Medicine

## 2015-06-10 DIAGNOSIS — R1032 Left lower quadrant pain: Secondary | ICD-10-CM | POA: Diagnosis not present

## 2015-06-13 ENCOUNTER — Ambulatory Visit (INDEPENDENT_AMBULATORY_CARE_PROVIDER_SITE_OTHER): Payer: Medicare Other | Admitting: Family Medicine

## 2015-06-13 ENCOUNTER — Encounter: Payer: Self-pay | Admitting: Family Medicine

## 2015-06-13 ENCOUNTER — Other Ambulatory Visit: Payer: Medicare Other

## 2015-06-13 VITALS — BP 130/64 | HR 84 | Temp 98.3°F | Resp 18

## 2015-06-13 DIAGNOSIS — N179 Acute kidney failure, unspecified: Secondary | ICD-10-CM | POA: Diagnosis not present

## 2015-06-13 DIAGNOSIS — I2510 Atherosclerotic heart disease of native coronary artery without angina pectoris: Secondary | ICD-10-CM

## 2015-06-13 DIAGNOSIS — K858 Other acute pancreatitis without necrosis or infection: Secondary | ICD-10-CM

## 2015-06-13 DIAGNOSIS — K859 Acute pancreatitis without necrosis or infection, unspecified: Secondary | ICD-10-CM | POA: Diagnosis not present

## 2015-06-13 NOTE — Progress Notes (Signed)
Subjective:    Patient ID: James Jones, male    DOB: 01/05/1946, 70 y.o.   MRN: [id number]  HPI 06/05/15 Patient reports pain for the last 3 days. The pain begins in the left lower back/left flank area. It comes and goes in waves. It radiates to his left lower quadrant suprapubic area. There are no exacerbating or alleviating factors. Movement does not affect the pain. Nothing he does seems to trigger or alleviate the pain. The pain is not reproducible on exam. There is no tenderness to palpation in the left lower back or in the right flank. I'm unable to reproduce the pain on exam. He has no CVA tenderness. His abdomen is soft nondistended nontender with normal bowel sounds. Past medical history is significant for a GI bleed with a hemoglobin approximately 8 in 2015. CT scan was obtained at that time which revealed no pathologic process in the abdomen. There was no evidence of a AAA mentioned on the CT scan report. Patient denies any fevers or recent illnesses. Family history is significant for pancreatic cancer. His brother recently died from pancreatic cancer with similar symptoms which I was as the patient concerned.  At that time, my plan was: Patient's physical exam is unremarkable. His history sounds suspicious for a possible kidney stone given the colicky nature of the pain in the radiation of the pain from his lower back to his pelvic area. He also reports severe nausea due to the pain. This morning he woke up and decide on the toilet feeling like he needed to vomit until the pain subsided. This is concerning for possible kidney stone although he denies any gross hematuria. I'll begin by obtaining a urinalysis to evaluate for hematuria. I will also obtain a CBC CMP and a lipase to evaluate for other possible causes or signs of abdominal pain. If workup is unremarkable, I would next proceed to a CT scan of the abdomen and pelvis given the severity of the pain. At the present time the patient's pain  is 3 on a scale of 10.  Urinalysis shows no blood and it does show some protein and ketones. I want to proceed with lab work and schedule the patient for a CT scan of the abdomen and pelvis to evaluate further. I believe based on his history was most likely kidney stone. Based on his benign abdominal exam I do not believe his diverticulitis. I will have the patient start taking Flomax 0.4 mg by mouth daily and Percocet 5/325 one every 6 hours as needed for pain. Should the pain worsen I will go to the emergency room. Meanwhile try to have the patient scheduled for a CT scan tomorrow. I recommended that he discontinue metformin.  Office Visit on 06/05/2015  Component Date Value Ref Range Status  . Color, Urine 06/05/2015 YELLOW  YELLOW Final   Comment: ** Please note change in unit of measure and reference range(s). **     . APPearance 06/05/2015 CLEAR  CLEAR Final  . Specific Gravity, Urine 06/05/2015 1.015  1.001 - 1.035 Final  . pH 06/05/2015 5.5  5.0 - 8.0 Final  . Glucose, UA 06/05/2015 NEGATIVE  NEGATIVE Final  . Bilirubin Urine 06/05/2015 NEGATIVE  NEGATIVE Final  . Ketones, ur 06/05/2015 1+* NEGATIVE Final  . Hgb urine dipstick 06/05/2015 NEGATIVE  NEGATIVE Final  . Protein, ur 06/05/2015 2+* NEGATIVE Final  . Nitrite 06/05/2015 NEGATIVE  NEGATIVE Final  . Leukocytes, UA 06/05/2015 NEGATIVE  NEGATIVE Final  . WBC 06/05/2015  7.8  3.8 - 10.8 K/uL Final  . RBC 06/05/2015 4.15* 4.20 - 5.80 MIL/uL Final  . Hemoglobin 06/05/2015 11.3* 13.0 - 17.0 g/dL Final  . HCT 06/05/2015 34.4* 38.5 - 50.0 % Final  . MCV 06/05/2015 82.9  80.0 - 100.0 fL Final  . MCH 06/05/2015 27.2  27.0 - 33.0 pg Final  . MCHC 06/05/2015 32.8  32.0 - 36.0 g/dL Final  . RDW 06/05/2015 14.5  11.0 - 15.0 % Final  . Platelets 06/05/2015 234  140 - 400 K/uL Final  . MPV 06/05/2015 8.8  7.5 - 12.5 fL Final  . Neutro Abs 06/05/2015 5694  1500 - 7800 cells/uL Final  . Lymphs Abs 06/05/2015 1560  850 - 3900 cells/uL Final    . Monocytes Absolute 06/05/2015 468  200 - 950 cells/uL Final  . Eosinophils Absolute 06/05/2015 78  15 - 500 cells/uL Final  . Basophils Absolute 06/05/2015 0  0 - 200 cells/uL Final  . Neutrophils Relative % 06/05/2015 73   Final  . Lymphocytes Relative 06/05/2015 20   Final  . Monocytes Relative 06/05/2015 6   Final  . Eosinophils Relative 06/05/2015 1   Final  . Basophils Relative 06/05/2015 0   Final  . Smear Review 06/05/2015 Criteria for review not met   Final   ** Please note change in unit of measure and reference range(s). **  . Sodium 06/05/2015 136  135 - 146 mmol/L Final  . Potassium 06/05/2015 4.2  3.5 - 5.3 mmol/L Final  . Chloride 06/05/2015 102  98 - 110 mmol/L Final  . CO2 06/05/2015 20  20 - 31 mmol/L Final  . Glucose, Bld 06/05/2015 93  70 - 99 mg/dL Final  . BUN 06/05/2015 30* 7 - 25 mg/dL Final  . Creat 06/05/2015 1.80* 0.70 - 1.18 mg/dL Final  . Total Bilirubin 06/05/2015 0.4  0.2 - 1.2 mg/dL Final  . Alkaline Phosphatase 06/05/2015 51  40 - 115 U/L Final  . AST 06/05/2015 25  10 - 35 U/L Final  . ALT 06/05/2015 18  9 - 46 U/L Final  . Total Protein 06/05/2015 6.7  6.1 - 8.1 g/dL Final  . Albumin 06/05/2015 4.5  3.6 - 5.1 g/dL Final  . Calcium 06/05/2015 9.6  8.6 - 10.3 mg/dL Final  . GFR, Est African American 06/05/2015 43* >=60 mL/min Final  . GFR, Est Non African American 06/05/2015 37* >=60 mL/min Final   Comment:   The estimated GFR is a calculation valid for adults (>=26 years old) that uses the CKD-EPI algorithm to adjust for age and sex. It is   not to be used for children, pregnant women, hospitalized patients,    patients on dialysis, or with rapidly changing kidney function. According to the NKDEP, eGFR >89 is normal, 60-89 shows mild impairment, 30-59 shows moderate impairment, 15-29 shows severe impairment and <15 is ESRD.     . Lipase 06/05/2015 140* 7 - 60 U/L Final  . WBC, UA 06/05/2015 NONE SEEN  <=5 WBC/HPF Final  . RBC / HPF 06/05/2015  NONE SEEN  <=2 RBC/HPF Final  . Squamous Epithelial / LPF 06/05/2015 0-5  <=5 HPF Final  . Bacteria, UA 06/05/2015 NONE SEEN  NONE SEEN HPF Final  . Crystals 06/05/2015 NONE SEEN  NONE SEEN HPF Final  . Casts 06/05/2015 NONE SEEN  NONE SEEN LPF Final  . Yeast 06/05/2015 NONE SEEN  NONE SEEN HPF Final   CT revealed: IMPRESSION: 1. No acute findings. No findings  to explain left lower quadrant pain. 2. Scattered colonic diverticula without evidence of diverticulitis. 3. Small gallstone stable from the prior CT. 4. Low-density renal masses consistent with cysts, left adrenal gland thickening and small right adrenal adenoma, stable from the prior CT.   The pain in his abdomen has not really improved. He continues to generally point to the center of his abdomen and states that it radiates into his left flank there are no exacerbating or alleviating factors. However he admits he has not been following the diet we discussed. He is eating fish and chicken and other heavy foods that may be aggravating his pancreas. I did a literature search today in the office to look at drugs that can possibly cause pancreatitis. Angiotensin receptor blockers would be the highest risk for this patient. Rosuvastatin would be a low risk but also a possibility. Given the fact he has acute kidney injury, will probably be prudent to take him off angiotensin receptor blocker/hydrochlorothiazide anyway Past Medical History  Diagnosis Date  . Diabetes mellitus without complication ([place])   . Hypertension   . Hyperlipidemia   . 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 OSA (obstructive sleep apnea) 03/18/2012    Doing better with CPAP  . 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 Pulmonary hypertension ([place]) 12/10/2010    ECHO:  Mild PH,mild LVH; PA pressures estimated  30-40 mmHg  . 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 History of GI bleed January 2015    No obvious findings on EGD/colonoscopy   Past Surgical History  Procedure Laterality Date  . Shoulder arthroscopy  2006  . Appendectomy  1974  . Carpal tunnel release    . Hemorrhoid surgery    . Eye surgery  1960    Left eye  . Cardiac catheterization  03/27/2010    Moderate mid RCA lesion,right radial approach,normal EF  . Left heart catheterization with coronary angiogram N/A 03/17/2012    Procedure: LEFT HEART CATHETERIZATION WITH CORONARY ANGIOGRAM;  Surgeon: Leonie Man, MD;  Location: [facility] CATH LAB;  Service: Cardiovascular;  Laterality: N/A;   Current Outpatient Prescriptions on File Prior to Visit  Medication Sig Dispense Refill  . Cholecalciferol (VITAMIN D-3) 1000 UNITS CAPS Take by mouth daily. 2 capsules.    . ferrous sulfate 325 (65 FE) MG tablet Take 1 tablet (325 mg total) by mouth 2 (two) times daily with a meal.  3  . fish oil-omega-3 fatty acids 1000 MG capsule Take 1 g by mouth daily.    Marland Kitchen GLIPIZIDE XL 2.5 MG 24 hr tablet TAKE 1 TABLET EVERY DAY 90 tablet 0  . glucose blood (FREESTYLE TEST STRIPS) test strip Use to test blood sugar 2 times daily as instructed. Dx: E11.59 200 each 1  . metFORMIN (GLUCOPHAGE-XR) 500 MG 24 hr tablet TAKE 2 TABLETS BY MOUTH TWICE A DAY WITH A MEAL 360 tablet 1  . Multiple Vitamin (MULTIVITAMIN WITH MINERALS) TABS Take 1 tablet by mouth daily.    Marland Kitchen oxyCODONE-acetaminophen (ROXICET) 5-325 MG tablet Take 1 tablet by mouth every 4 (four) hours as needed for severe pain. 30 tablet 0  . Potassium 99 MG TABS Take 1 tablet by mouth daily.    Marland Kitchen PRESCRIPTION MEDICATION Uses C-PAP at bedtime    .  ranitidine (ZANTAC) 300 MG tablet Take 300 mg by mouth at bedtime.    . rosuvastatin (CRESTOR) 40 MG tablet Take 1 tablet (40 mg total) by mouth daily. 90 tablet 3  . tamsulosin (FLOMAX) 0.4 MG  CAPS capsule Take 1 capsule (0.4 mg total) by mouth daily. 30 capsule 3  . valsartan-hydrochlorothiazide (DIOVAN-HCT) 320-25 MG tablet TAKE 1 TABLET EVERY DAY 90 tablet 0   No current facility-administered medications on file prior to visit.   No Known Allergies Social History   Social History  . Marital Status: Married    Spouse Name: N/A  . Number of Children: N/A  . Years of Education: N/A   Occupational History  . Not on file.   Social History Main Topics  . Smoking status: Former Smoker -- 1.00 packs/day    Types: Cigarettes    Quit date: 04/12/2012  . Smokeless tobacco: Never Used  . Alcohol Use: 0.6 oz/week    1 Standard drinks or equivalent per week  . Drug Use: Yes    Special: Marijuana     Comment: cannibus  . Sexual Activity: Not on file   Other Topics Concern  . Not on file   Social History Narrative   Married, father of 2, grandfather of 3.   He is a former smoker of about a pack to pack and half cigarettes a day -- he quit in March of this year.    He is an avid exerciser working at least 4-5 days a week doing her walking or stationary bike.      Review of Systems  All other systems reviewed and are negative.      Objective:   Physical Exam  Constitutional: He appears well-developed and well-nourished.  Neck: Neck supple. No JVD present. No thyromegaly present.  Cardiovascular: Normal rate, regular rhythm, normal heart sounds and intact distal pulses.   No murmur heard. Pulmonary/Chest: Effort normal and breath sounds normal. No respiratory distress. He has no wheezes. He has no rales.  Abdominal: Soft. Bowel sounds are normal. He exhibits no distension and no mass. There is no tenderness. There is no rebound and no guarding.  Musculoskeletal: He exhibits no edema.  Lymphadenopathy:    He has no cervical adenopathy.  Vitals reviewed.         Assessment & Plan:  Other acute pancreatitis - Plan: BASIC METABOLIC PANEL WITH GFR, Lipase  AKI  (acute kidney injury) ([place]) - Plan: BASIC METABOLIC PANEL WITH GFR  I believe the patient has been having occasional bouts of pancreatitis. He states that he does not drink. There is no evidence of gallstone pancreatitis on the CT scan. Therefore I believe is likely medication induced. I will have the patient discontinue Diovan HCT and recheck here in one week. I will have him push fluids, I want him eating a clear liquid diet including Jell-O and chicken broth. Hopefully symptoms will be improving a week. Recheck kidney function today along with a lipase

## 2015-06-14 LAB — BASIC METABOLIC PANEL WITH GFR
BUN: 13 mg/dL (ref 7–25)
CHLORIDE: 106 mmol/L (ref 98–110)
CO2: 24 mmol/L (ref 20–31)
CREATININE: 1.3 mg/dL — AB (ref 0.70–1.18)
Calcium: 8.5 mg/dL — ABNORMAL LOW (ref 8.6–10.3)
GFR, Est African American: 63 mL/min (ref >=60)
GFR, Est Non African American: 55 mL/min — ABNORMAL LOW (ref >=60)
Glucose, Bld: 87 mg/dL (ref 70–99)
Potassium: 4.1 mmol/L (ref 3.5–5.3)
Sodium: 139 mmol/L (ref 135–146)

## 2015-06-14 LAB — LIPASE: LIPASE: 114 U/L — AB (ref 7–60)

## 2015-06-20 ENCOUNTER — Ambulatory Visit (INDEPENDENT_AMBULATORY_CARE_PROVIDER_SITE_OTHER): Payer: Medicare Other | Admitting: Family Medicine

## 2015-06-20 ENCOUNTER — Encounter: Payer: Self-pay | Admitting: Family Medicine

## 2015-06-20 ENCOUNTER — Ambulatory Visit: Payer: Medicare Other | Admitting: Family Medicine

## 2015-06-20 VITALS — BP 146/88 | HR 78 | Temp 98.5°F | Resp 18 | Ht 69.0 in | Wt 213.0 lb

## 2015-06-20 DIAGNOSIS — K8500 Idiopathic acute pancreatitis without necrosis or infection: Secondary | ICD-10-CM

## 2015-06-20 DIAGNOSIS — I2510 Atherosclerotic heart disease of native coronary artery without angina pectoris: Secondary | ICD-10-CM

## 2015-06-20 DIAGNOSIS — M545 Low back pain, unspecified: Secondary | ICD-10-CM

## 2015-06-20 DIAGNOSIS — K859 Acute pancreatitis without necrosis or infection, unspecified: Secondary | ICD-10-CM | POA: Diagnosis not present

## 2015-06-20 LAB — BASIC METABOLIC PANEL WITH GFR
BUN: 12 mg/dL (ref 7–25)
CHLORIDE: 106 mmol/L (ref 98–110)
CO2: 22 mmol/L (ref 20–31)
CREATININE: 1.32 mg/dL — AB (ref 0.70–1.18)
Calcium: 8.8 mg/dL (ref 8.6–10.3)
GFR, Est African American: 62 mL/min (ref >=60)
GFR, Est Non African American: 54 mL/min — ABNORMAL LOW (ref >=60)
Glucose, Bld: 88 mg/dL (ref 70–99)
POTASSIUM: 4.1 mmol/L (ref 3.5–5.3)
Sodium: 137 mmol/L (ref 135–146)

## 2015-06-20 LAB — LIPASE: LIPASE: 71 U/L — AB (ref 7–60)

## 2015-06-20 NOTE — Progress Notes (Signed)
Subjective:    Patient ID: James Jones, male    DOB: Jan 13, 1946, 70 y.o.   MRN: [id number]  HPI 06/05/15 Patient reports pain for the last 3 days. The pain begins in the left lower back/left flank area. It comes and goes in waves. It radiates to his left lower quadrant suprapubic area. There are no exacerbating or alleviating factors. Movement does not affect the pain. Nothing he does seems to trigger or alleviate the pain. The pain is not reproducible on exam. There is no tenderness to palpation in the left lower back or in the right flank. I'm unable to reproduce the pain on exam. He has no CVA tenderness. His abdomen is soft nondistended nontender with normal bowel sounds. Past medical history is significant for a GI bleed with a hemoglobin approximately 8 in 2015. CT scan was obtained at that time which revealed no pathologic process in the abdomen. There was no evidence of a AAA mentioned on the CT scan report. Patient denies any fevers or recent illnesses. Family history is significant for pancreatic cancer. His brother recently died from pancreatic cancer with similar symptoms which I was as the patient concerned.  At that time, my plan was: Patient's physical exam is unremarkable. His history sounds suspicious for a possible kidney stone given the colicky nature of the pain in the radiation of the pain from his lower back to his pelvic area. He also reports severe nausea due to the pain. This morning he woke up and decide on the toilet feeling like he needed to vomit until the pain subsided. This is concerning for possible kidney stone although he denies any gross hematuria. I'll begin by obtaining a urinalysis to evaluate for hematuria. I will also obtain a CBC CMP and a lipase to evaluate for other possible causes or signs of abdominal pain. If workup is unremarkable, I would next proceed to a CT scan of the abdomen and pelvis given the severity of the pain. At the present time the patient's pain  is 3 on a scale of 10.  Urinalysis shows no blood and it does show some protein and ketones. I want to proceed with lab work and schedule the patient for a CT scan of the abdomen and pelvis to evaluate further. I believe based on his history was most likely kidney stone. Based on his benign abdominal exam I do not believe his diverticulitis. I will have the patient start taking Flomax 0.4 mg by mouth daily and Percocet 5/325 one every 6 hours as needed for pain. Should the pain worsen I will go to the emergency room. Meanwhile try to have the patient scheduled for a CT scan tomorrow. I recommended that he discontinue metformin.  Office Visit on 06/13/2015  Component Date Value Ref Range Status  . Sodium 06/13/2015 139  135 - 146 mmol/L Final  . Potassium 06/13/2015 4.1  3.5 - 5.3 mmol/L Final  . Chloride 06/13/2015 106  98 - 110 mmol/L Final  . CO2 06/13/2015 24  20 - 31 mmol/L Final  . Glucose, Bld 06/13/2015 87  70 - 99 mg/dL Final  . BUN 06/13/2015 13  7 - 25 mg/dL Final  . Creat 06/13/2015 1.30* 0.70 - 1.18 mg/dL Final  . Calcium 06/13/2015 8.5* 8.6 - 10.3 mg/dL Final  . GFR, Est African American 06/13/2015 63  >=60 mL/min Final  . GFR, Est Non African American 06/13/2015 55* >=60 mL/min Final   Comment:   The estimated GFR is a calculation  valid for adults (>=41 years old) that uses the CKD-EPI algorithm to adjust for age and sex. It is   not to be used for children, pregnant women, hospitalized patients,    patients on dialysis, or with rapidly changing kidney function. According to the NKDEP, eGFR >89 is normal, 60-89 shows mild impairment, 30-59 shows moderate impairment, 15-29 shows severe impairment and <15 is ESRD.     . Lipase 06/13/2015 114* 7 - 60 U/L Final  Office Visit on 06/05/2015  Component Date Value Ref Range Status  . Color, Urine 06/05/2015 YELLOW  YELLOW Final   Comment: ** Please note change in unit of measure and reference range(s). **     . APPearance  06/05/2015 CLEAR  CLEAR Final  . Specific Gravity, Urine 06/05/2015 1.015  1.001 - 1.035 Final  . pH 06/05/2015 5.5  5.0 - 8.0 Final  . Glucose, UA 06/05/2015 NEGATIVE  NEGATIVE Final  . Bilirubin Urine 06/05/2015 NEGATIVE  NEGATIVE Final  . Ketones, ur 06/05/2015 1+* NEGATIVE Final  . Hgb urine dipstick 06/05/2015 NEGATIVE  NEGATIVE Final  . Protein, ur 06/05/2015 2+* NEGATIVE Final  . Nitrite 06/05/2015 NEGATIVE  NEGATIVE Final  . Leukocytes, UA 06/05/2015 NEGATIVE  NEGATIVE Final  . WBC 06/05/2015 7.8  3.8 - 10.8 K/uL Final  . RBC 06/05/2015 4.15* 4.20 - 5.80 MIL/uL Final  . Hemoglobin 06/05/2015 11.3* 13.0 - 17.0 g/dL Final  . HCT 06/05/2015 34.4* 38.5 - 50.0 % Final  . MCV 06/05/2015 82.9  80.0 - 100.0 fL Final  . MCH 06/05/2015 27.2  27.0 - 33.0 pg Final  . MCHC 06/05/2015 32.8  32.0 - 36.0 g/dL Final  . RDW 06/05/2015 14.5  11.0 - 15.0 % Final  . Platelets 06/05/2015 234  140 - 400 K/uL Final  . MPV 06/05/2015 8.8  7.5 - 12.5 fL Final  . Neutro Abs 06/05/2015 5694  1500 - 7800 cells/uL Final  . Lymphs Abs 06/05/2015 1560  850 - 3900 cells/uL Final  . Monocytes Absolute 06/05/2015 468  200 - 950 cells/uL Final  . Eosinophils Absolute 06/05/2015 78  15 - 500 cells/uL Final  . Basophils Absolute 06/05/2015 0  0 - 200 cells/uL Final  . Neutrophils Relative % 06/05/2015 73   Final  . Lymphocytes Relative 06/05/2015 20   Final  . Monocytes Relative 06/05/2015 6   Final  . Eosinophils Relative 06/05/2015 1   Final  . Basophils Relative 06/05/2015 0   Final  . Smear Review 06/05/2015 Criteria for review not met   Final   ** Please note change in unit of measure and reference range(s). **  . Sodium 06/05/2015 136  135 - 146 mmol/L Final  . Potassium 06/05/2015 4.2  3.5 - 5.3 mmol/L Final  . Chloride 06/05/2015 102  98 - 110 mmol/L Final  . CO2 06/05/2015 20  20 - 31 mmol/L Final  . Glucose, Bld 06/05/2015 93  70 - 99 mg/dL Final  . BUN 06/05/2015 30* 7 - 25 mg/dL Final  . Creat  06/05/2015 1.80* 0.70 - 1.18 mg/dL Final  . Total Bilirubin 06/05/2015 0.4  0.2 - 1.2 mg/dL Final  . Alkaline Phosphatase 06/05/2015 51  40 - 115 U/L Final  . AST 06/05/2015 25  10 - 35 U/L Final  . ALT 06/05/2015 18  9 - 46 U/L Final  . Total Protein 06/05/2015 6.7  6.1 - 8.1 g/dL Final  . Albumin 06/05/2015 4.5  3.6 - 5.1 g/dL Final  . Calcium  06/05/2015 9.6  8.6 - 10.3 mg/dL Final  . GFR, Est African American 06/05/2015 43* >=60 mL/min Final  . GFR, Est Non African American 06/05/2015 37* >=60 mL/min Final   Comment:   The estimated GFR is a calculation valid for adults (>=65 years old) that uses the CKD-EPI algorithm to adjust for age and sex. It is   not to be used for children, pregnant women, hospitalized patients,    patients on dialysis, or with rapidly changing kidney function. According to the NKDEP, eGFR >89 is normal, 60-89 shows mild impairment, 30-59 shows moderate impairment, 15-29 shows severe impairment and <15 is ESRD.     . Lipase 06/05/2015 140* 7 - 60 U/L Final  . WBC, UA 06/05/2015 NONE SEEN  <=5 WBC/HPF Final  . RBC / HPF 06/05/2015 NONE SEEN  <=2 RBC/HPF Final  . Squamous Epithelial / LPF 06/05/2015 0-5  <=5 HPF Final  . Bacteria, UA 06/05/2015 NONE SEEN  NONE SEEN HPF Final  . Crystals 06/05/2015 NONE SEEN  NONE SEEN HPF Final  . Casts 06/05/2015 NONE SEEN  NONE SEEN LPF Final  . Yeast 06/05/2015 NONE SEEN  NONE SEEN HPF Final   CT revealed: IMPRESSION: 1. No acute findings. No findings to explain left lower quadrant pain. 2. Scattered colonic diverticula without evidence of diverticulitis. 3. Small gallstone stable from the prior CT. 4. Low-density renal masses consistent with cysts, left adrenal gland thickening and small right adrenal adenoma, stable from the prior CT.   The pain in his abdomen has not really improved. He continues to generally point to the center of his abdomen and states that it radiates into his left flank there are no  exacerbating or alleviating factors. However he admits he has not been following the diet we discussed. He is eating fish and chicken and other heavy foods that may be aggravating his pancreas. I did a literature search today in the office to look at drugs that can possibly cause pancreatitis. Angiotensin receptor blockers would be the highest risk for this patient. Rosuvastatin would be a low risk but also a possibility. Given the fact he has acute kidney injury, will probably be prudent to take him off angiotensin receptor blocker/hydrochlorothiazide anyway.  At that time, my plan was: I believe the patient has been having occasional bouts of pancreatitis. He states that he does not drink. There is no evidence of gallstone pancreatitis on the CT scan. Therefore I believe is likely medication induced. I will have the patient discontinue Diovan HCT and recheck here in one week. I will have him push fluids, I want him eating a clear liquid diet including Jell-O and chicken broth. Hopefully symptoms will be improving a week. Recheck kidney function today along with a lipase  06/20/15 The patient's pain has not changed. He is still 3 on a scale of 1-10. However the pain now seems lower in the lumbar lower back rather than the thoracic spine. It radiates around in a dermatomal pattern from approximately the level of L2 around his umbilicus bilaterally. The pain can be sharp and it can be exacerbated by twisting motions. Food seems to have no impact. Past Medical History  Diagnosis Date  . Diabetes mellitus without complication ([place])   . Hypertension   . Hyperlipidemia   . 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 OSA (obstructive sleep apnea) 03/18/2012    Doing better with CPAP  . 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 Pulmonary hypertension ([place])  12/10/2010    ECHO:  Mild PH,mild LVH; PA pressures estimated 30-40 mmHg  . 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 History of GI bleed January 2015    No obvious findings on EGD/colonoscopy   Past Surgical History  Procedure Laterality Date  . Shoulder arthroscopy  2006  . Appendectomy  1974  . Carpal tunnel release    . Hemorrhoid surgery    . Eye surgery  1960    Left eye  . Cardiac catheterization  03/27/2010    Moderate mid RCA lesion,right radial approach,normal EF  . Left heart catheterization with coronary angiogram N/A 03/17/2012    Procedure: LEFT HEART CATHETERIZATION WITH CORONARY ANGIOGRAM;  Surgeon: Leonie Man, MD;  Location: [facility] CATH LAB;  Service: Cardiovascular;  Laterality: N/A;   Current Outpatient Prescriptions on File Prior to Visit  Medication Sig Dispense Refill  . Cholecalciferol (VITAMIN D-3) 1000 UNITS CAPS Take by mouth daily. 2 capsules.    . COLCRYS 0.6 MG tablet Take 0.6 mg by mouth 2 (two) times daily as needed.   0  . ferrous sulfate 325 (65 FE) MG tablet Take 1 tablet (325 mg total) by mouth 2 (two) times daily with a meal.  3  . fish oil-omega-3 fatty acids 1000 MG capsule Take 1 g by mouth daily.    Marland Kitchen GLIPIZIDE XL 2.5 MG 24 hr tablet TAKE 1 TABLET EVERY DAY 90 tablet 0  . glucose blood (FREESTYLE TEST STRIPS) test strip Use to test blood sugar 2 times daily as instructed. Dx: E11.59 200 each 1  . metFORMIN (GLUCOPHAGE-XR) 500 MG 24 hr tablet TAKE 2 TABLETS BY MOUTH TWICE A DAY WITH A MEAL 360 tablet 1  . Multiple Vitamin (MULTIVITAMIN WITH MINERALS) TABS Take 1 tablet by mouth daily.    Marland Kitchen oxyCODONE-acetaminophen (ROXICET) 5-325 MG tablet Take 1 tablet by mouth every 4 (four) hours as needed for severe pain. 30 tablet 0  . Potassium 99 MG TABS Take 1 tablet by mouth daily.    Marland Kitchen PRESCRIPTION MEDICATION Uses C-PAP at bedtime    . ranitidine (ZANTAC) 300 MG tablet Take 300 mg by  mouth at bedtime.    . rosuvastatin (CRESTOR) 40 MG tablet Take 1 tablet (40 mg total) by mouth daily. 90 tablet 3  . tamsulosin (FLOMAX) 0.4 MG CAPS capsule Take 1 capsule (0.4 mg total) by mouth daily. 30 capsule 3  . valsartan-hydrochlorothiazide (DIOVAN-HCT) 320-25 MG tablet TAKE 1 TABLET EVERY DAY (Patient not taking: Reported on 06/20/2015) 90 tablet 0   No current facility-administered medications on file prior to visit.   No Known Allergies Social History   Social History  . Marital Status: Married    Spouse Name: N/A  . Number of Children: N/A  . Years of Education: N/A   Occupational History  . Not on file.   Social History Main Topics  . Smoking status: Former Smoker -- 1.00 packs/day    Types: Cigarettes    Quit date: 04/12/2012  . Smokeless tobacco: Never Used  . Alcohol Use: 0.6 oz/week    1 Standard drinks or equivalent per week  . Drug Use: Yes    Special: Marijuana     Comment: cannibus  . Sexual Activity: Not on file  Other Topics Concern  . Not on file   Social History Narrative   Married, father of 2, grandfather of 3.   He is a former smoker of about a pack to pack and half cigarettes a day -- he quit in March of this year.    He is an avid exerciser working at least 4-5 days a week doing her walking or stationary bike.      Review of Systems  All other systems reviewed and are negative.      Objective:   Physical Exam  Constitutional: He appears well-developed and well-nourished.  Neck: Neck supple. No JVD present. No thyromegaly present.  Cardiovascular: Normal rate, regular rhythm, normal heart sounds and intact distal pulses.   No murmur heard. Pulmonary/Chest: Effort normal and breath sounds normal. No respiratory distress. He has no wheezes. He has no rales.  Abdominal: Soft. Bowel sounds are normal. He exhibits no distension and no mass. There is no tenderness. There is no rebound and no guarding.  Musculoskeletal: He exhibits no  edema.       Lumbar back: He exhibits decreased range of motion and pain. He exhibits no bony tenderness and no spasm.  Lymphadenopathy:    He has no cervical adenopathy.  Vitals reviewed.         Assessment & Plan:  Idiopathic acute pancreatitis - Plan: BASIC METABOLIC PANEL WITH GFR, Lipase  Midline low back pain without sciatica - Plan: MR Lumbar Spine Wo Contrast  Recheck lipase to make sure it is back to normal. Also monitor kidney function. However I will schedule the patient for an MRI as I believe the pain may actually be due to lumbar degenerative disc disease and I feel it's important that we diagnosed the cause of the pain so we can determine if we can simply monitor it/ignore it versus intervention.

## 2015-06-28 ENCOUNTER — Ambulatory Visit
Admission: RE | Admit: 2015-06-28 | Discharge: 2015-06-28 | Disposition: A | Discharge status: Home / Self Care | Payer: Medicare Other | Source: Ambulatory Visit | Attending: Family Medicine | Admitting: Family Medicine

## 2015-06-28 DIAGNOSIS — M4806 Spinal stenosis, lumbar region: Secondary | ICD-10-CM | POA: Diagnosis not present

## 2015-06-28 DIAGNOSIS — M545 Low back pain, unspecified: Secondary | ICD-10-CM

## 2015-07-02 ENCOUNTER — Encounter: Payer: Self-pay | Admitting: Family Medicine

## 2015-07-04 ENCOUNTER — Telehealth: Payer: Self-pay | Admitting: Family Medicine

## 2015-07-04 NOTE — Telephone Encounter (Signed)
Nothing unless back side pain worsens to the point he wants surgery.

## 2015-07-04 NOTE — Telephone Encounter (Signed)
Pt given MRI results.  Pt wants to know what to do now?

## 2015-07-09 NOTE — Telephone Encounter (Signed)
Pt aware.  He does not want surgery at this time but will call back if chooses to go that route

## 2015-07-28 ENCOUNTER — Other Ambulatory Visit: Payer: Self-pay | Admitting: *Deleted

## 2015-07-28 DIAGNOSIS — Z9989 Dependence on other enabling machines and devices: Principal | ICD-10-CM

## 2015-07-28 DIAGNOSIS — G4733 Obstructive sleep apnea (adult) (pediatric): Secondary | ICD-10-CM

## 2015-07-31 ENCOUNTER — Encounter: Payer: Self-pay | Admitting: Cardiovascular Disease

## 2015-07-31 ENCOUNTER — Ambulatory Visit (INDEPENDENT_AMBULATORY_CARE_PROVIDER_SITE_OTHER): Payer: Medicare Other | Admitting: Cardiovascular Disease

## 2015-07-31 VITALS — BP 142/68 | HR 72 | Ht 68.0 in | Wt 219.8 lb

## 2015-07-31 DIAGNOSIS — E1159 Type 2 diabetes mellitus with other circulatory complications: Secondary | ICD-10-CM

## 2015-07-31 DIAGNOSIS — I10 Essential (primary) hypertension: Secondary | ICD-10-CM | POA: Diagnosis not present

## 2015-07-31 DIAGNOSIS — Z9989 Dependence on other enabling machines and devices: Secondary | ICD-10-CM

## 2015-07-31 DIAGNOSIS — G4733 Obstructive sleep apnea (adult) (pediatric): Secondary | ICD-10-CM | POA: Diagnosis not present

## 2015-07-31 DIAGNOSIS — E785 Hyperlipidemia, unspecified: Secondary | ICD-10-CM | POA: Diagnosis not present

## 2015-07-31 NOTE — Patient Instructions (Signed)
Medication Instructions:   Your physician recommends that you continue on your current medications as directed. Please refer to the Current Medication list given to you today.\  If you need a refill on your cardiac medications before your next appointment, please call your pharmacy.  Labwork:  NONE ORDER TODAY    Testing/Procedures:  NONE ORDER TODAY    Follow-Up: Your physician wants you to follow-up in: ONE YEAR WITH KELLY   You will receive a reminder letter in the mail two months in advance. If you don't receive a letter, please call our office to schedule the follow-up appointment.     Any Other Special Instructions Will Be Listed Below (If Applicable).                                                                                                                                                   

## 2015-08-01 ENCOUNTER — Encounter: Payer: Self-pay | Admitting: Internal Medicine

## 2015-08-01 ENCOUNTER — Other Ambulatory Visit: Payer: Self-pay | Admitting: Internal Medicine

## 2015-08-01 ENCOUNTER — Telehealth: Payer: Self-pay | Admitting: *Deleted

## 2015-08-01 ENCOUNTER — Other Ambulatory Visit: Payer: Self-pay | Admitting: *Deleted

## 2015-08-01 ENCOUNTER — Ambulatory Visit (INDEPENDENT_AMBULATORY_CARE_PROVIDER_SITE_OTHER): Payer: Medicare Other | Admitting: Internal Medicine

## 2015-08-01 VITALS — BP 132/78 | HR 71 | Ht 68.0 in | Wt 207.0 lb

## 2015-08-01 DIAGNOSIS — I2510 Atherosclerotic heart disease of native coronary artery without angina pectoris: Secondary | ICD-10-CM

## 2015-08-01 DIAGNOSIS — I10 Essential (primary) hypertension: Secondary | ICD-10-CM | POA: Diagnosis not present

## 2015-08-01 DIAGNOSIS — E785 Hyperlipidemia, unspecified: Secondary | ICD-10-CM | POA: Diagnosis not present

## 2015-08-01 DIAGNOSIS — E1159 Type 2 diabetes mellitus with other circulatory complications: Secondary | ICD-10-CM

## 2015-08-01 DIAGNOSIS — G4733 Obstructive sleep apnea (adult) (pediatric): Secondary | ICD-10-CM

## 2015-08-01 DIAGNOSIS — Z9989 Dependence on other enabling machines and devices: Principal | ICD-10-CM

## 2015-08-01 LAB — LIPID PANEL
CHOLESTEROL: 140 mg/dL (ref 0–200)
HDL: 58.7 mg/dL (ref >39.00)
LDL CALC: 58 mg/dL (ref 0–99)
NonHDL: 81.21
TRIGLYCERIDES: 118 mg/dL (ref 0.0–149.0)
Total CHOL/HDL Ratio: 2
VLDL: 23.6 mg/dL (ref 0.0–40.0)

## 2015-08-01 LAB — POCT GLYCOSYLATED HEMOGLOBIN (HGB A1C): HEMOGLOBIN A1C: 5.9

## 2015-08-01 MED ORDER — METFORMIN HCL ER 500 MG PO TB24: ORAL_TABLET | ORAL | Status: DC

## 2015-08-01 MED ORDER — GLUCOSE BLOOD VI STRP: ORAL_STRIP | Status: DC

## 2015-08-01 NOTE — Progress Notes (Signed)
Patient ID: James Jones, male   DOB: 1945/12/29, 70 y.o.   MRN: [id number]  HPI: James Jones is a 70 y.o.-year-old male, returning for f/u for DM2, dx in ~2005, non-insulin-dependent, uncontrolled, with complications (heart ds., mild CKD). Last visit 3 mo ago. PCP: Dr. Lynnea FerrierWarren Pickard  He is exercising 3x a week [facility] - last 2-3 years.  Lost 6 lbs in last month >> no appetite. He was seen by PCP 2 mo ago >> high Lipase >> decreasing. No AP.  Last hemoglobin A1c was: Lab Results  Component Value Date   HGBA1C 6.5 04/25/2015   HGBA1C 7.1 01/21/2015   HGBA1C 6.5 10/22/2014  04/11/2014: HbA1c 8.4% 01/08/2014: HbA1c 7.7% He had steroid inj for gout in his elbows - 12/2013. He also had a steroid inj in knee last fall, too.   Pt is on a regimen of: - Glipizide ER 2.5 mg daily - Metformin ER 500 mg 2x a day.  Had nausea, vomiting, loss of appetite with regular metformin.  He was on Actos. He also tried Metformin in the past >> unclear why.  He was on insulin before (Lantus) - came off years ago.  Pt checks his sugars 1-3x a day: - am: 96-106, 169 (icecream the night before) >> 93-123 >> 97-128 >> 79-115, 128 >> 87-111 - 2h after b'fast: 90-120 >> 91-168 >> n/c >> 97-199 (after pancakes) >> 105-140 >> 91, 153 >> 147 - before lunch: n/c >> 108 >> 116-137 >> 111-118 >> 82-108 >> 77, 100-138, 168 (candy) >> n/c - 2h after lunch: n/c >> 153  >> n/c >> 114-124 >> 150 >> 77-126, 163 >> 99, 101 - before dinner:45, 52 (skipped lunch and b'fast), 67-129 >> 69, 84-109 >> 66-118, 151x1 >> 64-112 - 2h after dinner: 118-140 >> 60 >> n/c >> 80-153 >> n/c >> 68-117 >> 111 - bedtime: n/c >> 65-103 >> 96 >> 80-112, 183 >> 80, 174 >> 67, 97, 172 >> 126 - nighttime: n/c >> 80 Lowest 60 >> 69 >> 66 - delayed meals >> 64; he has hypoglycemia awareness at 70.  Highest sugar was 200x1 >> 168 >> 199 >> 174 >> 172x1 >> 147  Glucometer: Freestyle  Pt's meals are: - Breakfast: bacon + eggs,  sausage, grits, oatmeal, cereal, toast wheat - Lunch: BLT, soups, fruit, nuts - Dinner: chicken, pork + greens, rice  - Snacks: 1 a day: pretzels; carrots  - + mild CKD, last BUN/creatinine:  Lab Results  Component Value Date   BUN 12 06/20/2015   CREATININE 1.32* 06/20/2015  05/24/2014: 34/1.64 01/08/2014: 18/1.15 On Valsartan. - last set of lipids: 04/11/2014: 206/143/70/106 01/08/2014: 161/98/60/73 Lab Results  Component Value Date   CHOL 182 03/18/2012   HDL 53 03/18/2012   LDLCALC 90 03/18/2012   TRIG 193* 03/18/2012   CHOLHDL 3.4 03/18/2012  He is on Rosuvastatin 40 mg daily.  - last eye exam was in 09/2014. No DR. Had cataracts removed 01/09/2015 >> implant in R eye. - no numbness and tingling in his feet.  Last TSH 1.478 (05/24/2014).  ROS: Constitutional: no weight gain/loss, no fatigue, no subjective hyperthermia/hypothermia, no nocturia Eyes: no blurry vision, no xerophthalmia ENT: no sore throat, no nodules palpated in throat, no dysphagia/odynophagia, no hoarseness Cardiovascular: no CP/SOB/palpitations/leg swelling Respiratory: no cough/SOB Gastrointestinal: no N/V/D/C/heartburn, no AP Musculoskeletal: no muscle/joint aches Skin: no rashes Neurological: no tremors/numbness/tingling/dizziness  I reviewed pt's medications, allergies, PMH, social hx, family hx, and changes were documented in the history  of present illness. Otherwise, unchanged from my initial visit note:  Past Medical History  Diagnosis Date  . Diabetes mellitus without complication (HCC)   . Hypertension   . Hyperlipidemia   . 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 OSA (obstructive sleep apnea) 03/18/2012    Doing better with CPAP  . 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 Pulmonary hypertension (HCC) 12/10/2010    ECHO:  Mild  PH,mild LVH; PA pressures estimated 30-40 mmHg  . 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 History of GI bleed January 2015    No obvious findings on EGD/colonoscopy   Past Surgical History  Procedure Laterality Date  . Shoulder arthroscopy  2006  . Appendectomy  1974  . Carpal tunnel release    . Hemorrhoid surgery    . Eye surgery  1960    Left eye  . Cardiac catheterization  03/27/2010    Moderate mid RCA lesion,right radial approach,normal EF  . Left heart catheterization with coronary angiogram N/A 03/17/2012    Procedure: LEFT HEART CATHETERIZATION WITH CORONARY ANGIOGRAM;  Surgeon: Marykay Lexavid W Harding, MD;  [facility];  Service: Cardiovascular;  Laterality: N/A;   History   Social History  . Marital Status: Married    Spouse Name: N/A   Occupational History  . retired   Social History Main Topics  . Smoking status: Former Smoker -- 2.00 packs/day    Quit date: 04/12/2012  . Smokeless tobacco: Never Used  . Alcohol Use: 0.5 oz/week    1 drink(s) per week  . Drug Use: Yes    Special: Marijuana     Comment: cannibus   Social History Narrative   Married, father of 2, grandfather of 3.   He is a former smoker of about a pack to pack and half cigarettes a day -- he quit in March of this year.    He is an avid exerciser working at least 4-5 days a week doing her walking or stationary bike.   Current Outpatient Prescriptions on File Prior to Visit  Medication Sig Dispense Refill  . Cholecalciferol (VITAMIN D-3) 1000 UNITS CAPS Take by mouth daily. 2 capsules.    . COLCRYS 0.6 MG tablet Take 0.6 mg by mouth 2 (two) times daily as needed.   0  . ferrous sulfate 325 (65 FE) MG tablet Take 1 tablet (325 mg total) by mouth 2 (two) times daily with a meal.  3  . fish oil-omega-3 fatty acids 1000 MG capsule Take 1 g by mouth daily.    Marland Kitchen. GLIPIZIDE XL 2.5 MG 24 hr tablet TAKE 1 TABLET EVERY DAY 90 tablet  0  . glucose blood (FREESTYLE TEST STRIPS) test strip Use to test blood sugar 2 times daily as instructed. Dx: E11.59 200 each 1  . metFORMIN (GLUCOPHAGE-XR) 500 MG 24 hr tablet TAKE 2 TABLETS BY MOUTH TWICE A DAY WITH A MEAL 360 tablet 1  . Multiple Vitamin (MULTIVITAMIN WITH MINERALS) TABS Take 1 tablet by mouth daily.    . Potassium 99 MG TABS Take 1 tablet by mouth daily.    Marland Kitchen. PRESCRIPTION MEDICATION Uses C-PAP at bedtime    . ranitidine (ZANTAC) 300 MG tablet Take 300 mg by mouth at bedtime.    .Marland Kitchen  rosuvastatin (CRESTOR) 40 MG tablet Take 1 tablet (40 mg total) by mouth daily. 90 tablet 3  . tamsulosin (FLOMAX) 0.4 MG CAPS capsule Take 1 capsule (0.4 mg total) by mouth daily. 30 capsule 3  . valsartan-hydrochlorothiazide (DIOVAN-HCT) 320-25 MG tablet TAKE 1 TABLET EVERY DAY 90 tablet 0  . oxyCODONE-acetaminophen (ROXICET) 5-325 MG tablet Take 1 tablet by mouth every 4 (four) hours as needed for severe pain. (Patient not taking: Reported on 08/01/2015) 30 tablet 0   No current facility-administered medications on file prior to visit.   No Known Allergies Family History  Problem Relation Age of Onset  . Coronary artery disease Father   . Kidney failure Mother   . Kidney failure Sister    PE: BP 132/78 mmHg  Pulse 71  Ht  (1.727 m)  Wt 207 lb (93.895 kg)  BMI 31.48 kg/m2  SpO2 95% Body mass index is 31.48 kg/(m^2). Wt Readings from Last 3 Encounters:  08/01/15 207 lb (93.895 kg)  07/31/15 219 lb 12.8 oz (99.701 kg) >> per pt, was measured at 210 lbs  06/20/15 213 lb (96.616 kg)   Constitutional: overweight, in NAD Eyes: PERRLA, EOMI, no exophthalmos ENT: moist mucous membranes, no thyromegaly, small isthmic thyroid nodule on palpation of his anterior neck , no cervical lymphadenopathy Cardiovascular: RRR, No MRG Respiratory: CTA B Gastrointestinal: abdomen soft, NT, ND, BS+ Musculoskeletal: no deformities, strength intact in all 4 Skin: moist, warm, no  rashes Neurological: no tremor with outstretched hands, DTR normal in all 4  ASSESSMENT: 1. DM2, non-insulin-dependent, uncontrolled, with complications - heart ds - ? - cardiologist Dr. Herbie Baltimore - mild CKD  - At previous visits, we discussed about starting Tradjenta instead of Glipizide but he wanted to see GI first as he had some AP (has seen Dr Ewing Schlein in the past). No current AP.   2. Thyroid nodule  3. HTN  PLAN:  1. Patient with long-standing, uncontrolled diabetes, on oral antidiabetic regimen, with much improved control after adding Metformin XR. His sugars are great! Will try to stop Glipizide. -  I suggested to:  Patient Instructions  Please continue: - Metformin ER 500 mg 2x a day.  Stop: - Glipizide ER  Please return in 3 months with your sugar log.   Your hbA1c is excellent, at 5.9%!  - continue checking sugars at different times of the day - check 1-2 times a day, rotating checks - UTD with yearly eye exams - check HbA1c today >> 5.9% (better!) - will check a new lipid panel today - Return to clinic in 3 mo with sugar log   2. Thyroid nodule on palpation - small isthmic thyroid nodule on palpation >> thyroid ultrasound (04/2015): normal  3. HTN - BP at goal today - per his request, will refill the Diovan HCT  Office Visit on 08/01/2015  Component Date Value Ref Range Status  . Cholesterol 08/01/2015 140  0 - 200 mg/dL Final   ATP III Classification       Desirable:  < 200 mg/dL               Borderline High:  200 - 239 mg/dL          High:  > = 161 mg/dL  . Triglycerides 08/01/2015 118.0  0.0 - 149.0 mg/dL Final   Normal:  <096 mg/dLBorderline High:  150 - 199 mg/dL  . HDL 08/01/2015 58.70  >39.00 mg/dL Final  . VLDL 04/54/0981 23.6  0.0 - 40.0 mg/dL  Final  . LDL Cholesterol 08/01/2015 58  0 - 99 mg/dL Final  . Total CHOL/HDL Ratio 08/01/2015 2   Final                  Men          Women1/2 Average Risk     3.4          3.3Average Risk          5.0           4.42X Average Risk          9.6          7.13X Average Risk          15.0          11.0                      . NonHDL 08/01/2015 81.21   Final   NOTE:  Non-HDL goal should be 30 mg/dL higher than patient's LDL goal (i.e. LDL goal of < 70 mg/dL, would have non-HDL goal of < 100 mg/dL)  . Hemoglobin A1C 08/01/2015 5.9   Final   Excellent Lipid levels.

## 2015-08-01 NOTE — Telephone Encounter (Signed)
Placed order via EPIC for [facility] to get a CPAP download per VO  By Dr Tresa EndoKelly. Staff message sent to [facility]etson.

## 2015-08-01 NOTE — Patient Instructions (Addendum)
Please continue: - Metformin ER 500 mg 2x a day.  Stop: - Glipizide ER  Please return in 3 months with your sugar log.   Your hbA1c is excellent, at 5.9%!

## 2015-08-02 ENCOUNTER — Encounter: Payer: Self-pay | Admitting: Cardiovascular Disease

## 2015-08-02 DIAGNOSIS — G4733 Obstructive sleep apnea (adult) (pediatric): Secondary | ICD-10-CM | POA: Insufficient documentation

## 2015-08-02 DIAGNOSIS — Z9989 Dependence on other enabling machines and devices: Secondary | ICD-10-CM

## 2015-08-02 NOTE — Progress Notes (Signed)
Patient ID: Matthews Franks, male   DOB: December 07, 1945, 70 y.o.   MRN: [id number]    Primary cardiology: Dr. Glenetta Hew. Primary: Karis Juba, [place]  HPI: Eddy Liszewski, is a 70 y.o. male who presents to the office today for 3 year f/u sleep clinic evaluation.  Mr. Davit Vassar originally from [place] to help. While in [place] in the 1990's  he was diagnosed with sleep apnea. He was given a CPAP unit. However, he never used CPAP. He has a history of hypertension, hyperlipidemia, diabetes mellitus, GERD, and remote tobacco use. He smoked for approximately 45 years but quit smoking in March 2000.  In February 2014  he was referred for a split-night sleep study. This confirmed severe sleep apnea. At that time he has significant daytime sleepiness with an Epworth scale of 15. AHI was 69.3 per hour and REM sleep was increased at 71.5 events per hour. He dropped his O2 saturation to 81% with REM sleep and had loud snoring. He underwent a split-night evaluation. He also was prescribed an 18 cm fixed water pressure.  Since initiating CPAP therapy, he does feel he is sleeping better he typically goes to have possibly 9:30 at night and wakes up around 5 to 5:30 in the morning. A download  from 06/21/2012 through June 2014 revealed 77% of the days with usage. He admits that he did not use it when he went on vacation. His average usage on days used to 6 hours and 2 minutes. He had 70% of days greater than 4 hours. HI was excellent at 1.4 is 18 cm water pressure.  Epworth Sleepiness Scale: Situation   Chance of Dozing/Sleeping (0 = never , 1 = slight chance , 2 = moderate chance , 3 = high chance )   sitting and reading 1   watching TV 2   sitting inactive in a public place 2   being a passenger in a motor vehicle for an hour or more 0   lying down in the afternoon 3   sitting and talking to someone 0   sitting quietly after lunch (no alcohol) 2   while stopped for a few minutes in traffic as  the driver 0   Total Score  10    Over the past 3 years, he states that he has been doing fairly well.  He did not bring his machine with him today.  However, he believes that he is using CPAP at least 90-95% of the time.  He typically watches television in bed and oftentimes may falsely before putting his CPAP unit on.  He still goes to bed between 9 and 10 PM and wakes up between 5:30 and 6 AM.  He admits to frequent urination.  He does [facility] as his DME company.  He has a Pharmacist, community and a nasal mask.  He is unaware of any breakthrough snoring.  He believes his sleep is restorative.  A new Epworth Sleepiness Scale score was calculated today and this endorsed at 9 highest chance of dozing watching television, and lying down to rest in the afternoon when circumstances persist.  There is a moderate chance of dozing sitting quietly after lunch without alcohol and only a slight chance of dozing sitting inactive in a public place.  He denies any awareness of nocturnal palpitations.  Past Medical History  Diagnosis Date  . Diabetes mellitus without complication ([place])   . Hypertension   . Hyperlipidemia   .  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 OSA (obstructive sleep apnea) 03/18/2012    Doing better with CPAP  . 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 Pulmonary hypertension ([place]) 12/10/2010    ECHO:  Mild PH,mild LVH; PA pressures estimated 30-40 mmHg  . 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 History of GI bleed January 2015    No obvious findings on EGD/colonoscopy    Past Surgical History  Procedure Laterality Date  . Shoulder arthroscopy  2006  . Appendectomy  1974  . Carpal tunnel release    . Hemorrhoid surgery    . Eye surgery  1960     Left eye  . Cardiac catheterization  03/27/2010    Moderate mid RCA lesion,right radial approach,normal EF  . Left heart catheterization with coronary angiogram N/A 03/17/2012    Procedure: LEFT HEART CATHETERIZATION WITH CORONARY ANGIOGRAM;  Surgeon: Leonie Man, MD;  Location[facility] CATH LAB;  Service: Cardiovascular;  Laterality: N/A;    No Known Allergies  Current Outpatient Prescriptions  Medication Sig Dispense Refill  . Cholecalciferol (VITAMIN D-3) 1000 UNITS CAPS Take by mouth daily. 2 capsules.    . COLCRYS 0.6 MG tablet Take 0.6 mg by mouth 2 (two) times daily as needed.   0  . ferrous sulfate 325 (65 FE) MG tablet Take 1 tablet (325 mg total) by mouth 2 (two) times daily with a meal.  3  . fish oil-omega-3 fatty acids 1000 MG capsule Take 1 g by mouth daily.    . Multiple Vitamin (MULTIVITAMIN WITH MINERALS) TABS Take 1 tablet by mouth daily.    Marland Kitchen oxyCODONE-acetaminophen (ROXICET) 5-325 MG tablet Take 1 tablet by mouth every 4 (four) hours as needed for severe pain. (Patient not taking: Reported on 08/01/2015) 30 tablet 0  . Potassium 99 MG TABS Take 1 tablet by mouth daily.    Marland Kitchen PRESCRIPTION MEDICATION Uses C-PAP at bedtime    . ranitidine (ZANTAC) 300 MG tablet Take 300 mg by mouth at bedtime.    . rosuvastatin (CRESTOR) 40 MG tablet Take 1 tablet (40 mg total) by mouth daily. 90 tablet 3  . tamsulosin (FLOMAX) 0.4 MG CAPS capsule Take 1 capsule (0.4 mg total) by mouth daily. 30 capsule 3  . glucose blood (FREESTYLE TEST STRIPS) test strip Use to test blood sugar 2 times daily as instructed. Dx: E11.59 200 each 3  . metFORMIN (GLUCOPHAGE-XR) 500 MG 24 hr tablet TAKE 1 TABLET BY MOUTH TWICE A DAY WITH A MEAL 180 tablet 3  . valsartan-hydrochlorothiazide (DIOVAN-HCT) 320-25 MG tablet TAKE 1 TABLET EVERY DAY 90 tablet 3   No current facility-administered medications for this visit.    Socially He is from [place]; he is has 2 children 3 grandchildren. It smoked for over 40  years and quit smoking in March 2014. Does drink occasional alcohol. He does exercise. He is originally from [place]. He is looking [place] area for 4 years  ROS General: Negative; No fevers, chills, or night sweats;  HEENT: Negative; No changes in vision or hearing, sinus congestion, difficulty swallowing Pulmonary: Negative; No cough, wheezing, shortness of breath, hemoptysis Cardiovascular: Negative; No chest pain, presyncope, syncope, palpitations GI: Negative; No nausea, vomiting, diarrhea, or abdominal pain  GU: Positive for urination at least 2 times per night. Musculoskeletal: Negative; no myalgias, joint pain, or weakness Hematologic/Oncology: Negative; no easy bruising, bleeding Endocrine: Negative; no heat/cold intolerance; no diabetes Neuro: Negative; no changes in balance, headaches Skin: Negative; No rashes or skin lesions Psychiatric: Negative; No behavioral problems, depression Sleep: Positive for severe obstructive sleep apnea on CPAP therapy.  No residual snoring or daytime sleepiness, hypersomnolence, bruxism, restless legs, hypnogognic hallucinations, no cataplexy Other comprehensive 14 point system review is negative.  PE BP 142/68 mmHg  Pulse 72  Ht 5' 8"  (1.727 m)  Wt 219 lb 12.8 oz (99.701 kg)  BMI 33.43 kg/m2   Wt Readings from Last 3 Encounters:  08/01/15 207 lb (93.895 kg)  07/31/15 219 lb 12.8 oz (99.701 kg)  06/20/15 213 lb (96.616 kg)   General: Alert, oriented, no distress.  Skin: normal turgor, no rashes HEENT: Normocephalic, atraumatic. Pupils round and reactive; sclera anicteric; suggestion of small bilateral cataracts Fundi mild arteriolar narrowing Nose without nasal septal hypertrophy Mouth/Parynx benign; Mallinpatti scale 3/4 Neck: No JVD, no carotid briuts Lungs: clear to ausculatation and percussion; no wheezing or rales Heart: RRR, s1 s2 normal 1/6 sem Abdomen: soft, nontender; no hepatosplenomehaly, BS+; abdominal aorta  nontender and not dilated by palpation. Pulses 2+ Extremities: no clubbinbg cyanosis or edema, Homan's sign negative  Neurologic: grossly nonfocal   LABS:  BMP Latest Ref Rng 06/20/2015 06/13/2015 06/05/2015  Glucose 70 - 99 mg/dL 88 87 93  BUN 7 - 25 mg/dL 12 13 30(H)  Creatinine 0.70 - 1.18 mg/dL 1.32(H) 1.30(H) 1.80(H)  Sodium 135 - 146 mmol/L 137 139 136  Potassium 3.5 - 5.3 mmol/L 4.1 4.1 4.2  Chloride 98 - 110 mmol/L 106 106 102  CO2 20 - 31 mmol/L 22 24 20   Calcium 8.6 - 10.3 mg/dL 8.8 8.5(L) 9.6    Hepatic Function Latest Ref Rng 06/05/2015 02/14/2013 02/13/2013  Total Protein 6.1 - 8.1 g/dL 6.7 6.3 6.6  Albumin 3.6 - 5.1 g/dL 4.5 3.3(L) 3.5  AST 10 - 35 U/L 25 19 18   ALT 9 - 46 U/L 18 11 13   Alk Phosphatase 40 - 115 U/L 51 61 65  Total Bilirubin 0.2 - 1.2 mg/dL 0.4 0.2(L) <0.2(L)    CBC Latest Ref Rng 06/05/2015 02/14/2013 02/13/2013  WBC 3.8 - 10.8 K/uL 7.8 5.4 DUPLICATE REQUEST SEE [id number]  Hemoglobin 13.0 - 17.0 g/dL 11.3(L) 8.3(L) DUPLICATE REQUEST SEE [id number]  Hematocrit 38.5 - 50.0 % 34.4(L) 24.7(L) DUPLICATE REQUEST SEE [id number]  Platelets 140 - 400 K/uL 408 144 DUPLICATE REQUEST SEE [id number]    Lab Results  Component Value Date   MCV 82.9 06/05/2015   MCV 81.3 02/14/2013   MCV 83.0 14/97/0263   MCV DUPLICATE REQUEST SEE [id number] 02/13/2013    Lab Results  Component Value Date   TSH 1.625 03/17/2012   Lab Results  Component Value Date   HGBA1C 5.9 08/01/2015   Lipid Panel     Component Value Date/Time   CHOL 140 08/01/2015 0948   TRIG 118.0 08/01/2015 0948   HDL 58.70 08/01/2015 0948   CHOLHDL 2 08/01/2015 0948   VLDL 23.6 08/01/2015 0948   LDLCALC 58 08/01/2015 0948    RADIOLOGY: No results found.    ASSESSMENT AND PLAN:  Mr. Dalesandro is a 70 -year-old African-American male who has a history of obstructive sleep apnea dating back over 20 years.  He never used CPAP until 2014.  Following his evaluation at the [facility],  which  revealed severe obstructive sleep apnea with an AHI of 69.3/h and during REM sleep 71.5 per hour; he had loud snoring and significant oxygen desaturation.  He did not bring his machine with him today.  However, he has told her ResMed unit.  He is followed by [employer] as his DME company.  He feels confident that his compliance is at least 90%.  He is providing adequate sleep duration time.  I discussed with him good sleep hygiene habits and particularly since at times he has fallen in sleep in bed while watching television before he put on his CPAP mask.  I discussed with him the positive benefit of continued CPAP therapy with reference to cardiovascular comorbidities.  He is obese, diabetic, and has a history of hypertension and hyperlipidemia.  He is using a nasal mask and seems to tolerate this well.  I've asked that we obtain a download of his current unit.  He will need to [facility] so that this can be scanned.  He has noticed frequent urination at nighttime.  I want to make certain that this is not due to ongoing obstructive apnea.  Alternatively, he may require urologic evaluation with PSA testing.  I will contact him with the recall results.  Concerning his download.  If adjustments to his device need to be made.  Per Medicare guidelines, I will need to see him on an annual basis from a sleep perspective and will schedule him for one-year follow-up evaluation.  Time spent: 25 minutes  Troy Sine, MD, [facility]  08/02/2015 1:33 PM

## 2015-09-01 ENCOUNTER — Other Ambulatory Visit: Payer: Self-pay | Admitting: Physician Assistant

## 2015-09-01 NOTE — Telephone Encounter (Signed)
Medication refilled per protocol. 

## 2015-09-19 DIAGNOSIS — K219 Gastro-esophageal reflux disease without esophagitis: Secondary | ICD-10-CM | POA: Diagnosis not present

## 2015-09-19 DIAGNOSIS — R109 Unspecified abdominal pain: Secondary | ICD-10-CM | POA: Diagnosis not present

## 2015-10-16 DIAGNOSIS — Z6830 Body mass index (BMI) 30.0-30.9, adult: Secondary | ICD-10-CM | POA: Diagnosis not present

## 2015-10-29 ENCOUNTER — Encounter: Payer: Self-pay | Admitting: Internal Medicine

## 2015-10-29 ENCOUNTER — Ambulatory Visit (INDEPENDENT_AMBULATORY_CARE_PROVIDER_SITE_OTHER): Payer: Medicare Other | Admitting: Internal Medicine

## 2015-10-29 VITALS — BP 130/80 | HR 72 | Ht 67.0 in | Wt 201.0 lb

## 2015-10-29 DIAGNOSIS — I2510 Atherosclerotic heart disease of native coronary artery without angina pectoris: Secondary | ICD-10-CM

## 2015-10-29 DIAGNOSIS — E785 Hyperlipidemia, unspecified: Secondary | ICD-10-CM

## 2015-10-29 DIAGNOSIS — E1159 Type 2 diabetes mellitus with other circulatory complications: Secondary | ICD-10-CM | POA: Diagnosis not present

## 2015-10-29 DIAGNOSIS — Z23 Encounter for immunization: Secondary | ICD-10-CM

## 2015-10-29 LAB — POCT GLYCOSYLATED HEMOGLOBIN (HGB A1C): Hemoglobin A1C: 6.2

## 2015-10-29 NOTE — Addendum Note (Signed)
Addended by: Darene LamerHOMPSON, Lon Klippel T on: 10/29/2015 09:35 AM   Modules accepted: Orders

## 2015-10-29 NOTE — Patient Instructions (Signed)
Please continue: - Metformin ER 500 mg 2x a day.  Please return in 3 months with your sugar log.   Your hbA1c is excellent, at 6.2%!

## 2015-10-29 NOTE — Progress Notes (Signed)
Patient ID: James Jones, male   DOB: [birth year], 70 y.o.   MRN: [id number]  HPI: James CanalesJames Jones is a 70 y.o.-year-old male, returning for f/u for DM2, dx in ~2005, non-insulin-dependent, uncontrolled, with complications (heart ds., mild CKD). Last visit 3 mo ago. PCP: Dr. Lynnea FerrierWarren Pickard   He is exercising 3x a week [facility] - last 2-3 years.  Last hemoglobin A1c was: Lab Results  Component Value Date   HGBA1C 5.9 08/01/2015   HGBA1C 6.5 04/25/2015   HGBA1C 7.1 01/21/2015  04/11/2014: HbA1c 8.4% 01/08/2014: HbA1c 7.7% He had steroid inj for gout in his elbows - 12/2013. He also had a steroid inj in knee fall 2016, too.   Pt is on a regimen of: - Metformin ER 500 mg 2x a day.  We stopped Glipizide ER 2.5 mg daily in 07/2015. Had nausea, vomiting, loss of appetite with regular metformin.  He was on Actos. He also tried Metformin in the past >> unclear why.  He was on insulin before (Lantus) - came off years ago.  Pt checks his sugars 1-3x a day - great: - am: 96-106, 169 (icecream the night before) >> 93-123 >> 97-128 >> 79-115, 128 >> 87-111 >> 84-104 - 2h after b'fast: 90-120 >> 91-168 >> n/c >> 97-199 (after pancakes) >> 105-140 >> 91, 153 >> 147 >> n/c - before lunch: n/c >> 108 >> 116-137 >> 111-118 >> 82-108 >> 77, 100-138, 168 (candy) >> n/c >> 89-122 - 2h after lunch: n/c >> 153  >> n/c >> 114-124 >> 150 >> 77-126, 163 >> 99, 101 >> 102 - before dinner:45, 52 (skipped lunch and b'fast), 67-129 >> 69, 84-109 >> 66-118, 151x1 >> 64-112 >> 86, 110 - 2h after dinner: 118-140 >> 60 >> n/c >> 80-153 >> n/c >> 68-117 >> 111 >> 69 x1, 87-142 - bedtime: n/c >> 65-103 >> 96 >> 80-112, 183 >> 80, 174 >> 67, 97, 172 >> 126 >> 105 - nighttime: n/c >> 80 >> 105 >> 100 Lowest 60 >> 69 >> 66 - delayed meals >> 64 >> 69; he has hypoglycemia awareness at 70.  Highest sugar was 200x1 >> 168 >> 199 >> 174 >> 172x1 >> 147 >> 142.  Glucometer: Freestyle  Pt's meals are: -  Breakfast: bacon + eggs, sausage, grits, oatmeal, cereal, toast wheat - Lunch: BLT, soups, fruit, nuts - Dinner: chicken, pork + greens, rice  - Snacks: 1 a day: pretzels; carrots  - + mild CKD, last BUN/creatinine:  Lab Results  Component Value Date   BUN 12 06/20/2015   CREATININE 1.32 (H) 06/20/2015  05/24/2014: 34/1.64 01/08/2014: 18/1.15 On Valsartan. - last set of lipids: 04/11/2014: 206/143/70/106 01/08/2014: 161/98/60/73 Lab Results  Component Value Date   CHOL 140 08/01/2015   HDL 58.70 08/01/2015   LDLCALC 58 08/01/2015   TRIG 118.0 08/01/2015   CHOLHDL 2 08/01/2015  He is on Rosuvastatin 40 mg daily.  - last eye exam was in 09/2014. No DR. Had cataracts removed 01/09/2015 >> implant in R eye. He had the other cataract removed 02/2015. - no numbness and tingling in his feet.  Last TSH 1.478 (05/24/2014).  ROS: Constitutional: no weight gain/loss, no fatigue, no subjective hyperthermia/hypothermia, no nocturia Eyes: no blurry vision, no xerophthalmia ENT: no sore throat, no nodules palpated in throat, no dysphagia/odynophagia, no hoarseness Cardiovascular: no CP/SOB/palpitations/leg swelling Respiratory: no cough/SOB Gastrointestinal: no N/V/D/C/heartburn Musculoskeletal: no muscle/joint aches Skin: no rashes Neurological: no tremors/numbness/tingling/dizziness  I reviewed pt's medications, allergies,  PMH, social hx, family hx, and changes were documented in the history of present illness. Otherwise, unchanged from my initial visit note:  Past Medical History:  Diagnosis Date  . 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 CAD (coronary artery disease), with 60-70% stenosis in RCA 03/18/2012   FFR 0.84; EF 60-65%  . 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 Diabetes mellitus without complication (HCC)   . History of GI bleed January 2015   No obvious findings on EGD/colonoscopy  . Hyperlipidemia   . Hypertension   . OSA  (obstructive sleep apnea) 03/18/2012   Doing better with CPAP  . Pulmonary hypertension (HCC) 12/10/2010   ECHO:  Mild PH,mild LVH; PA pressures estimated 30-40 mmHg  . 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 APPENDECTOMY  1974  . CARDIAC CATHETERIZATION  03/27/2010   Moderate mid RCA lesion,right radial approach,normal EF  . CARPAL TUNNEL RELEASE    . EYE SURGERY  1960   Left eye  . HEMORRHOID SURGERY    . LEFT HEART CATHETERIZATION WITH CORONARY ANGIOGRAM N/A 03/17/2012   Procedure: LEFT HEART CATHETERIZATION WITH CORONARY ANGIOGRAM;  Surgeon: Marykay Lex, MD;  Location: [facility] CATH LAB;  Service: Cardiovascular;  Laterality: N/A;  . SHOULDER ARTHROSCOPY  2006   History   Social History  . Marital Status: Married    Spouse Name: N/A   Occupational History  . retired   Social History Main Topics  . Smoking status: Former Smoker -- 2.00 packs/day    Quit date: 04/12/2012  . Smokeless tobacco: Never Used  . Alcohol Use: 0.5 oz/week    1 drink(s) per week  . Drug Use: Yes    Special: Marijuana     Comment: cannibus   Social History Narrative   Married, father of 2, grandfather of 3.   He is a former smoker of about a pack to pack and half cigarettes a day -- he quit in March of this year.    He is an avid exerciser working at least 4-5 days a week doing her walking or stationary bike.   Current Outpatient Prescriptions on File Prior to Visit  Medication Sig Dispense Refill  . Cholecalciferol (VITAMIN D-3) 1000 UNITS CAPS Take by mouth daily. 2 capsules.    . COLCRYS 0.6 MG tablet Take 0.6 mg by mouth 2 (two) times daily as needed.   0  . ferrous sulfate 325 (65 FE) MG tablet Take 1 tablet (325 mg total) by mouth 2 (two) times daily with a meal.  3  . fish oil-omega-3 fatty acids 1000 MG capsule Take 1 g by mouth daily.    Marland Kitchen  glucose blood (FREESTYLE TEST STRIPS) test strip Use to test blood sugar 2 times daily as instructed. Dx: E11.59 200 each 3  . metFORMIN (GLUCOPHAGE-XR) 500 MG 24 hr tablet TAKE 1 TABLET BY MOUTH TWICE A DAY WITH A MEAL 180 tablet 3  . Multiple Vitamin (MULTIVITAMIN WITH MINERALS) TABS Take 1 tablet by mouth daily.    . Potassium 99 MG TABS Take 1 tablet by mouth daily.    Marland Kitchen PRESCRIPTION MEDICATION Uses C-PAP at bedtime    . ranitidine (ZANTAC) 300 MG tablet TAKE 1 TABLET BY MOUTH AT NIGHT 90 tablet 2  . rosuvastatin (CRESTOR) 40 MG tablet Take 1 tablet (40 mg  total) by mouth daily. 90 tablet 3  . tamsulosin (FLOMAX) 0.4 MG CAPS capsule Take 1 capsule (0.4 mg total) by mouth daily. 30 capsule 3  . valsartan-hydrochlorothiazide (DIOVAN-HCT) 320-25 MG tablet TAKE 1 TABLET EVERY DAY 90 tablet 3  . oxyCODONE-acetaminophen (ROXICET) 5-325 MG tablet Take 1 tablet by mouth every 4 (four) hours as needed for severe pain. (Patient not taking: Reported on 10/29/2015) 30 tablet 0   No current facility-administered medications on file prior to visit.    No Known Allergies Family History  Problem Relation Age of Onset  . Coronary artery disease Father   . Kidney failure Mother   . Kidney failure Sister    PE: BP 130/80 (BP Location: Left Arm, Patient Position: Sitting)   Pulse 72   Ht 5\' 7"  (1.702 m)   Wt 201 lb (91.2 kg)   SpO2 95%   BMI 31.48 kg/m  Body mass index is 31.48 kg/m. Wt Readings from Last 3 Encounters:  10/29/15 201 lb (91.2 kg)  08/01/15 207 lb (93.9 kg)  07/31/15 219 lb 12.8 oz (99.7 kg)   Constitutional: overweight, in NAD Eyes: PERRLA, EOMI, no exophthalmos ENT: moist mucous membranes, no thyromegaly, small isthmic thyroid nodule on palpation of his anterior neck , no cervical lymphadenopathy Cardiovascular: RRR, No MRG Respiratory: CTA B Gastrointestinal: abdomen soft, NT, ND, BS+ Musculoskeletal: no deformities, strength intact in all 4 Skin: moist, warm, no  rashes Neurological: no tremor with outstretched hands, DTR normal in all 4  ASSESSMENT: 1. DM2, non-insulin-dependent, uncontrolled, with complications - heart ds - ? - cardiologist Dr. Herbie Baltimore - mild CKD   2. HL  PLAN:  1. Patient with long-standing, uncontrolled diabetes, on oral antidiabetic regimen, with much improved control after adding Metformin XR. His sugars are great! We stopped Glipizide ER at last visit. Will continue off Glipizide. -  I suggested to:  Patient Instructions  Please continue: - Metformin ER 500 mg 2x a day.  Please return in 3 months with your sugar log.   Your hbA1c is excellent, at 6.2%!  - continue checking sugars at different times of the day - check 1-2 times a day, rotating checks - UTD with yearly eye exams - will give his flu shot today - check HbA1c today >> 6.2% (great!) - Return to clinic in 3 mo with sugar log   2. HL - excellent Lipid levels at last check - continue Crestor  Carlus Pavlov, MD PhD [facility]

## 2015-11-27 ENCOUNTER — Emergency Department (HOSPITAL_COMMUNITY)
Admission: EM | Admit: 2015-11-27 | Discharge: 2015-11-27 | Disposition: A | Discharge status: Home / Self Care | Payer: Medicare Other | Attending: Emergency Medicine | Admitting: Emergency Medicine

## 2015-11-27 ENCOUNTER — Encounter (HOSPITAL_COMMUNITY): Payer: Self-pay | Admitting: Emergency Medicine

## 2015-11-27 ENCOUNTER — Emergency Department (HOSPITAL_COMMUNITY): Payer: Medicare Other

## 2015-11-27 DIAGNOSIS — Z7984 Long term (current) use of oral hypoglycemic drugs: Secondary | ICD-10-CM | POA: Diagnosis not present

## 2015-11-27 DIAGNOSIS — M545 Low back pain, unspecified: Secondary | ICD-10-CM

## 2015-11-27 DIAGNOSIS — N182 Chronic kidney disease, stage 2 (mild): Secondary | ICD-10-CM | POA: Insufficient documentation

## 2015-11-27 DIAGNOSIS — E1122 Type 2 diabetes mellitus with diabetic chronic kidney disease: Secondary | ICD-10-CM | POA: Diagnosis not present

## 2015-11-27 DIAGNOSIS — G8929 Other chronic pain: Secondary | ICD-10-CM

## 2015-11-27 DIAGNOSIS — I2510 Atherosclerotic heart disease of native coronary artery without angina pectoris: Secondary | ICD-10-CM | POA: Diagnosis not present

## 2015-11-27 DIAGNOSIS — N281 Cyst of kidney, acquired: Secondary | ICD-10-CM | POA: Diagnosis not present

## 2015-11-27 DIAGNOSIS — N289 Disorder of kidney and ureter, unspecified: Secondary | ICD-10-CM | POA: Diagnosis not present

## 2015-11-27 DIAGNOSIS — I129 Hypertensive chronic kidney disease with stage 1 through stage 4 chronic kidney disease, or unspecified chronic kidney disease: Secondary | ICD-10-CM | POA: Diagnosis not present

## 2015-11-27 DIAGNOSIS — D649 Anemia, unspecified: Secondary | ICD-10-CM | POA: Diagnosis not present

## 2015-11-27 DIAGNOSIS — R001 Bradycardia, unspecified: Secondary | ICD-10-CM | POA: Diagnosis not present

## 2015-11-27 DIAGNOSIS — Z87891 Personal history of nicotine dependence: Secondary | ICD-10-CM | POA: Diagnosis not present

## 2015-11-27 DIAGNOSIS — R1030 Lower abdominal pain, unspecified: Secondary | ICD-10-CM | POA: Insufficient documentation

## 2015-11-27 DIAGNOSIS — M5442 Lumbago with sciatica, left side: Secondary | ICD-10-CM | POA: Diagnosis not present

## 2015-11-27 LAB — URINE MICROSCOPIC-ADD ON: SQUAMOUS EPITHELIAL / LPF: NONE SEEN

## 2015-11-27 LAB — URINALYSIS, ROUTINE W REFLEX MICROSCOPIC
Bilirubin Urine: NEGATIVE
Glucose, UA: NEGATIVE mg/dL
Hgb urine dipstick: NEGATIVE
Ketones, ur: NEGATIVE mg/dL
LEUKOCYTES UA: NEGATIVE
NITRITE: NEGATIVE
PROTEIN: 30 mg/dL — AB
SPECIFIC GRAVITY, URINE: 1.011 (ref 1.005–1.030)
pH: 5.5 (ref 5.0–8.0)

## 2015-11-27 LAB — CBC
HCT: 32 % — ABNORMAL LOW (ref 39.0–52.0)
HEMOGLOBIN: 10.7 g/dL — AB (ref 13.0–17.0)
MCH: 26.6 pg (ref 26.0–34.0)
MCHC: 33.4 g/dL (ref 30.0–36.0)
MCV: 79.6 fL (ref 78.0–100.0)
PLATELETS: 252 10*3/uL (ref 150–400)
RBC: 4.02 MIL/uL — AB (ref 4.22–5.81)
RDW: 13.5 % (ref 11.5–15.5)
WBC: 6.6 10*3/uL (ref 4.0–10.5)

## 2015-11-27 LAB — COMPREHENSIVE METABOLIC PANEL
ALK PHOS: 53 U/L (ref 38–126)
ALT: 15 U/L — AB (ref 17–63)
ANION GAP: 8 (ref 5–15)
AST: 23 U/L (ref 15–41)
Albumin: 4.1 g/dL (ref 3.5–5.0)
BUN: 17 mg/dL (ref 6–20)
CALCIUM: 9.3 mg/dL (ref 8.9–10.3)
CO2: 25 mmol/L (ref 22–32)
CREATININE: 1.53 mg/dL — AB (ref 0.61–1.24)
Chloride: 108 mmol/L (ref 101–111)
GFR, EST AFRICAN AMERICAN: 51 mL/min — AB (ref >60)
GFR, EST NON AFRICAN AMERICAN: 44 mL/min — AB (ref >60)
Glucose, Bld: 145 mg/dL — ABNORMAL HIGH (ref 65–99)
Potassium: 4.1 mmol/L (ref 3.5–5.1)
SODIUM: 141 mmol/L (ref 135–145)
Total Bilirubin: 0.3 mg/dL (ref 0.3–1.2)
Total Protein: 6.5 g/dL (ref 6.5–8.1)

## 2015-11-27 LAB — LIPASE, BLOOD: LIPASE: 108 U/L — AB (ref 11–51)

## 2015-11-27 MED ORDER — MORPHINE SULFATE (PF) 4 MG/ML IV SOLN
4.0000 mg | Freq: Once | INTRAVENOUS | Status: AC
  Administered 2015-11-27: 4 mg via INTRAVENOUS
  Filled 2015-11-27: qty 1

## 2015-11-27 MED ORDER — IOPAMIDOL (ISOVUE-300) INJECTION 61%
INTRAVENOUS | Status: AC
  Administered 2015-11-27: 100 mL
  Filled 2015-11-27: qty 100

## 2015-11-27 NOTE — ED Notes (Signed)
Pt returned from ct and placed on monitor 

## 2015-11-27 NOTE — ED Provider Notes (Addendum)
[facility]-EMERGENCY DEPT Provider Note   CSN: [id number] Arrival date & time: 11/27/15  1103     History   Chief Complaint Chief Complaint  Patient presents with  . Back Pain    HPI James Jones is a 70 y.o. male.Complains of low nonradiating back pain for the past 8 months becoming worse over approximately the past 3 days. Pain is worse with changing position such as tying his shoes and improved with remaining still. Pain radiates to lower abdomen. He treated himself with 2 oxycodone  tablets as morning with partial relief. Pain is severe presently. No loss of bladder or bowel control no fever. She had MRI scan of his lumbar spine performed May 2017 for similar pain which showed foraminal narrowing at multiple sites. He denies change in appetite denies weight loss. Denies urinary symptoms. Denies fever. No other associated symptoms.  HPI  Past Medical History:  Diagnosis Date  . 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 CAD (coronary artery disease), with 60-70% stenosis in RCA 03/18/2012   FFR 0.84; EF 60-65%  . 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 Diabetes mellitus without complication (HCC)   . History of GI bleed January 2015   No obvious findings on EGD/colonoscopy  . Hyperlipidemia   . Hypertension   . OSA (obstructive sleep apnea) 03/18/2012   Doing better with CPAP  . Pulmonary hypertension 12/10/2010   ECHO:  Mild PH,mild LVH; PA pressures estimated 30-40 mmHg  . 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 OSA on CPAP 08/02/2015  . Murmur, cardiac 03/20/2015  . Type 2 diabetes mellitus with other circulatory complications 04/29/2014  . Acute blood loss anemia 02/13/2013  . Dyslipidemia, goal LDL below 70 10/15/2012  . Obesity (BMI 30-39.9); 32.6 on 10/02/2012 10/02/2012  . OSA  (obstructive sleep apnea), has been cpap intolerant 03/18/2012  . 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 CAD (coronary artery disease), with 60-70% stenosis in RCA 03/18/2012  . Essential hypertension 03/17/2012  . RBBB 03/17/2012    Past Surgical History:  Procedure Laterality Date  . APPENDECTOMY  1974  . CARDIAC CATHETERIZATION  03/27/2010   Moderate mid RCA lesion,right radial approach,normal EF  . CARPAL TUNNEL RELEASE    . EYE SURGERY  1960   Left eye  . HEMORRHOID SURGERY    . LEFT HEART CATHETERIZATION WITH CORONARY ANGIOGRAM N/A 03/17/2012   Procedure: LEFT HEART CATHETERIZATION WITH CORONARY ANGIOGRAM;  Surgeon: Marykay Lex, MD;  Location: [facility] CATH LAB;  Service: Cardiovascular;  Laterality: N/A;  . SHOULDER ARTHROSCOPY  2006       Home Medications    Prior to Admission medications   Medication Sig Start Date End Date Taking? Authorizing Provider  Cholecalciferol (VITAMIN D-3) 1000 UNITS CAPS Take by mouth daily. 2 capsules.    Historical Provider, MD  COLCRYS 0.6 MG tablet Take 0.6 mg by mouth 2 (two) times daily as needed.  04/09/15   Historical Provider, MD  ferrous sulfate 325 (65 FE) MG tablet Take 1 tablet (325 mg total) by mouth 2 (two) times daily with a meal. 02/14/13   Christiane Ha, MD  fish oil-omega-3 fatty acids 1000 MG capsule Take 1 g by mouth daily.    Historical Provider, MD  glucose blood (  FREESTYLE TEST STRIPS) test strip Use to test blood sugar 2 times daily as instructed. Dx: E11.59 08/01/15   Carlus Pavlov, MD  metFORMIN (GLUCOPHAGE-XR) 500 MG 24 hr tablet TAKE 1 TABLET BY MOUTH TWICE A DAY WITH A MEAL 08/01/15   Carlus Pavlov, MD  Multiple Vitamin (MULTIVITAMIN WITH MINERALS) TABS Take 1 tablet by mouth daily.    Historical Provider, MD  oxyCODONE-acetaminophen (ROXICET) 5-325 MG tablet Take 1 tablet by mouth every 4 (four) hours as needed for severe pain. Patient not taking: Reported on 10/29/2015  06/05/15   Donita Brooks, MD  Potassium 99 MG TABS Take 1 tablet by mouth daily.    Historical Provider, MD  PRESCRIPTION MEDICATION Uses C-PAP at bedtime    Historical Provider, MD  ranitidine (ZANTAC) 300 MG tablet TAKE 1 TABLET BY MOUTH AT NIGHT 09/01/15   Dorena Bodo, PA-C  rosuvastatin (CRESTOR) 40 MG tablet Take 1 tablet (40 mg total) by mouth daily. 01/21/15   Carlus Pavlov, MD  tamsulosin (FLOMAX) 0.4 MG CAPS capsule Take 1 capsule (0.4 mg total) by mouth daily. 06/05/15   Donita Brooks, MD  valsartan-hydrochlorothiazide (DIOVAN-HCT) 320-25 MG tablet TAKE 1 TABLET EVERY DAY 08/01/15   Carlus Pavlov, MD    Family History Family History  Problem Relation Age of Onset  . Kidney failure Mother   . Coronary artery disease Father   . Kidney failure Sister     Social History Social History  Substance Use Topics  . Smoking status: Former Smoker    Packs/day: 1.00    Types: Cigarettes    Quit date: 04/12/2012  . Smokeless tobacco: Never Used  . Alcohol use 0.6 oz/week    1 Standard drinks or equivalent per week     Allergies   Review of patient's allergies indicates no known allergies.   Review of Systems Review of Systems  Constitutional: Negative.   HENT: Negative.   Respiratory: Negative.   Cardiovascular: Negative.   Gastrointestinal: Positive for abdominal pain.  Musculoskeletal: Positive for back pain.  Skin: Negative.   Allergic/Immunologic: Positive for immunocompromised state.       Diabetic  Neurological: Negative.   Psychiatric/Behavioral: Negative.   All other systems reviewed and are negative.    Physical Exam Updated Vital Signs BP 134/73   Pulse 62   Temp 98.5 F (36.9 C) (Oral)   Resp 16   Ht 5\' 8"  (1.727 m)   Wt 200 lb (90.7 kg)   SpO2 97%   BMI 30.41 kg/m   Physical Exam  Constitutional: He appears well-developed and well-nourished. He appears distressed.  Appears uncomfortable  HENT:  Head: Normocephalic and atraumatic.    Eyes: Conjunctivae are normal. Pupils are equal, round, and reactive to light.  Neck: Neck supple. No tracheal deviation present. No thyromegaly present.  Cardiovascular: Regular rhythm.   No murmur heard. Bradycardic, counted  48 bpm by me  Pulmonary/Chest: Effort normal and breath sounds normal.  Abdominal: Soft. Bowel sounds are normal. He exhibits no distension. There is no tenderness.  Musculoskeletal: Normal range of motion. He exhibits no edema or tenderness.  Entire spine is nontender. He has exquisite pain at left lateral back upon standing from supine position.  Neurological: He is alert. He displays normal reflexes. Coordination normal.  Skin: Skin is warm and dry. No rash noted.  Psychiatric: He has a normal mood and affect.  Nursing note and vitals reviewed.  Results for orders placed or performed during the hospital encounter of 11/27/15  Lipase, blood  Result Value Ref Range   Lipase 108 (H) 11 - 51 U/L  Comprehensive metabolic panel  Result Value Ref Range   Sodium 141 135 - 145 mmol/L   Potassium 4.1 3.5 - 5.1 mmol/L   Chloride 108 101 - 111 mmol/L   CO2 25 22 - 32 mmol/L   Glucose, Bld 145 (H) 65 - 99 mg/dL   BUN 17 6 - 20 mg/dL   Creatinine, Ser 1.611.53 (H) 0.61 - 1.24 mg/dL   Calcium 9.3 8.9 - 09.610.3 mg/dL   Total Protein 6.5 6.5 - 8.1 g/dL   Albumin 4.1 3.5 - 5.0 g/dL   AST 23 15 - 41 U/L   ALT 15 (L) 17 - 63 U/L   Alkaline Phosphatase 53 38 - 126 U/L   Total Bilirubin 0.3 0.3 - 1.2 mg/dL   GFR calc non Af Amer 44 (L) >60 mL/min   GFR calc Af Amer 51 (L) >60 mL/min   Anion gap 8 5 - 15  CBC  Result Value Ref Range   WBC 6.6 4.0 - 10.5 K/uL   RBC 4.02 (L) 4.22 - 5.81 MIL/uL   Hemoglobin 10.7 (L) 13.0 - 17.0 g/dL   HCT 04.532.0 (L) 40.939.0 - 81.152.0 %   MCV 79.6 78.0 - 100.0 fL   MCH 26.6 26.0 - 34.0 pg   MCHC 33.4 30.0 - 36.0 g/dL   RDW 91.413.5 78.211.5 - 95.615.5 %   Platelets 252 150 - 400 K/uL  Urinalysis, Routine w reflex microscopic  Result Value Ref Range   Color,  Urine YELLOW YELLOW   APPearance CLEAR CLEAR   Specific Gravity, Urine 1.011 1.005 - 1.030   pH 5.5 5.0 - 8.0   Glucose, UA NEGATIVE NEGATIVE mg/dL   Hgb urine dipstick NEGATIVE NEGATIVE   Bilirubin Urine NEGATIVE NEGATIVE   Ketones, ur NEGATIVE NEGATIVE mg/dL   Protein, ur 30 (A) NEGATIVE mg/dL   Nitrite NEGATIVE NEGATIVE   Leukocytes, UA NEGATIVE NEGATIVE  Urine microscopic-add on  Result Value Ref Range   Squamous Epithelial / LPF NONE SEEN NONE SEEN   WBC, UA 0-5 0 - 5 WBC/hpf   RBC / HPF 0-5 0 - 5 RBC/hpf   Bacteria, UA RARE (A) NONE SEEN   Ct Abdomen Pelvis W Contrast  Result Date: 11/27/2015 CLINICAL DATA:  Lower abdominal pain. Family history of pancreatic cancer. EXAM: CT ABDOMEN AND PELVIS WITH CONTRAST TECHNIQUE: Multidetector CT imaging of the abdomen and pelvis was performed using the standard protocol following bolus administration of intravenous contrast. CONTRAST:  100mL ISOVUE-300 IOPAMIDOL (ISOVUE-300) INJECTION 61% COMPARISON:  CT 06/10/2015, 02/14/2013 , 05/24/2011. FINDINGS: Lower chest: There is mild scarring at both lung bases. No pleural or pericardial fluid. Coronary artery calcification is noted. Hepatobiliary: No liver parenchymal lesion. No calcified gallstones. No ductal dilatation. Pancreas: Normal. No pancreatic mass. No pancreatic ductal abnormality. No pancreatic inflammation. Spleen: Normal Adrenals/Urinary Tract: Nodularity of both adrenal glands is chronic, stable and benign. Multiple renal cysts are present bilaterally no evidence of solid mass. No stone or hydronephrosis. Stomach/Bowel: No abnormal bowel finding. Vascular/Lymphatic: Aortic atherosclerosis. No aneurysm. IVC is normal. No retroperitoneal mass or adenopathy. Reproductive: Normal appearing prostate and seminal vesicles. Other: No free fluid or air. Musculoskeletal: Chronic spondylosis of facet arthropathy. No acute bone finding. IMPRESSION: No cause of abdominal or back pain is identified other  than degenerative disease of the spine. Aortic atherosclerosis. No aneurysm. Coronary artery atherosclerosis appear Chronic adrenal nodularity, stable and benign. Chronic bilateral  renal cysts, slowly enlarging as often seen. Electronically Signed   By: Paulina Fusi M.D.   On: 11/27/2015 13:57    ED Treatments / Results  Labs (all labs ordered are listed, but only abnormal results are displayed) Labs Reviewed  CBC - Abnormal; Notable for the following:       Result Value   RBC 4.02 (*)    Hemoglobin 10.7 (*)    HCT 32.0 (*)    All other components within normal limits  LIPASE, BLOOD  COMPREHENSIVE METABOLIC PANEL  URINALYSIS, ROUTINE W REFLEX MICROSCOPIC (NOT AT [facility])    EKG  EKG Interpretation None       Radiology No results found.  Procedures Procedures (including critical care time)  Medications Ordered in ED Medications  morphine 4 MG/ML injection 4 mg (not administered)     Initial Impression / Assessment and Plan / ED Course  I have reviewed the triage vital signs and the nursing notes.  Pertinent labs & imaging results that were available during my care of the patient were reviewed by me and considered in my medical decision making (see chart for details).  Clinical Course    3:50 PM pain much improved after treatment with intravenous morphine. He is alert and ambulates without difficulty. Plan continue oxycodone as directed. I suggest Senokot to prevent constipation. I also suggested he contact his primary care physician for referral to a pain clinic and renal insufficiency is chronic. Anemia is chronic Final Clinical Impressions(s) / ED Diagnoses  Diagnosis#1 chronic back pain #2 chronic renal insufficiency #3 chronic anemia Final diagnoses:  None  #4 asymptomatic bradycardia  New Prescriptions New Prescriptions   No medications on file     Doug Sou, MD 11/27/15 1556    Doug Sou, MD 11/27/15 1600

## 2015-11-27 NOTE — ED Notes (Signed)
Awaiting ct scan

## 2015-11-27 NOTE — ED Notes (Signed)
[facility]

## 2015-11-27 NOTE — Discharge Instructions (Signed)
Take your oxycodone as prescribed. Take Senokot as directed to prevent constipation. Contact your primary care physician for referral to a pain clinic

## 2015-11-27 NOTE — ED Notes (Signed)
Patient ambulated without difficulty down the hall and back

## 2015-11-27 NOTE — ED Triage Notes (Signed)
Pt c/o low back pain, has been seen by primary care dr, and neurosurgeon-- states "It's not my back-- I don't need surgery" -- states that his brother had the same pain, and died with with pancreatitic cancer-- cousin that had pancreatitic cancer also. Pt is concerned that pain may be from pancreas.

## 2015-11-28 ENCOUNTER — Telehealth: Payer: Self-pay | Admitting: Physician Assistant

## 2015-11-28 NOTE — Telephone Encounter (Signed)
Patient is calling to say that the hospital recommended him getting a referral for a pain clinic, also would like an rx for his oxycodone  [phone]

## 2015-12-01 ENCOUNTER — Encounter: Payer: Self-pay | Admitting: Family Medicine

## 2015-12-01 ENCOUNTER — Ambulatory Visit (INDEPENDENT_AMBULATORY_CARE_PROVIDER_SITE_OTHER): Payer: Medicare Other | Admitting: Family Medicine

## 2015-12-01 VITALS — BP 122/58 | HR 58 | Temp 98.2°F | Resp 18 | Wt 202.0 lb

## 2015-12-01 DIAGNOSIS — M5136 Other intervertebral disc degeneration, lumbar region: Secondary | ICD-10-CM

## 2015-12-01 DIAGNOSIS — I2510 Atherosclerotic heart disease of native coronary artery without angina pectoris: Secondary | ICD-10-CM | POA: Diagnosis not present

## 2015-12-01 DIAGNOSIS — D638 Anemia in other chronic diseases classified elsewhere: Secondary | ICD-10-CM | POA: Diagnosis not present

## 2015-12-01 MED ORDER — OXYCODONE-ACETAMINOPHEN 5-325 MG PO TABS: 1.0000 | ORAL_TABLET | Freq: Three times a day (TID) | ORAL | 0 refills | Status: DC | PRN

## 2015-12-01 MED ORDER — PREDNISONE 20 MG PO TABS: ORAL_TABLET | ORAL | 0 refills | Status: DC

## 2015-12-01 NOTE — Telephone Encounter (Signed)
Dr. Tanya NonesPickard sees him.

## 2015-12-01 NOTE — Telephone Encounter (Signed)
ntbs for any pain meds

## 2015-12-01 NOTE — Telephone Encounter (Signed)
Appointment scheduled.

## 2015-12-01 NOTE — Progress Notes (Signed)
Subjective:    Patient ID: James Jones, male    DOB: [birth year], 70 y.o.   MRN: [id number]  HPI4/27/17 Patient reports pain for the last 3 days. The pain begins in the left lower back/left flank area. It comes and goes in waves. It radiates to his left lower quadrant suprapubic area. There are no exacerbating or alleviating factors. Movement does not affect the pain. Nothing he does seems to trigger or alleviate the pain. The pain is not reproducible on exam. There is no tenderness to palpation in the left lower back or in the right flank. I'm unable to reproduce the pain on exam. He has no CVA tenderness. His abdomen is soft nondistended nontender with normal bowel sounds. Past medical history is significant for a GI bleed with a hemoglobin approximately 8 in 2015. CT scan was obtained at that time which revealed no pathologic process in the abdomen. There was no evidence of a AAA mentioned on the CT scan report. Patient denies any fevers or recent illnesses. Family history is significant for pancreatic cancer. His brother recently died from pancreatic cancer with similar symptoms which I was as the patient concerned.  At that time, my plan was: Patient's physical exam is unremarkable. His history sounds suspicious for a possible kidney stone given the colicky nature of the pain in the radiation of the pain from his lower back to his pelvic area. He also reports severe nausea due to the pain. This morning he woke up and decide on the toilet feeling like he needed to vomit until the pain subsided. This is concerning for possible kidney stone although he denies any gross hematuria. I'll begin by obtaining a urinalysis to evaluate for hematuria. I will also obtain a CBC CMP and a lipase to evaluate for other possible causes or signs of abdominal pain. If workup is unremarkable, I would next proceed to a CT scan of the abdomen and pelvis given the severity of the pain. At the present time the patient's pain  is 3 on a scale of 10.  Urinalysis shows no blood and it does show some protein and ketones. I want to proceed with lab work and schedule the patient for a CT scan of the abdomen and pelvis to evaluate further. I believe based on his history was most likely kidney stone. Based on his benign abdominal exam I do not believe his diverticulitis. I will have the patient start taking Flomax 0.4 mg by mouth daily and Percocet 5/325 one every 6 hours as needed for pain. Should the pain worsen I will go to the emergency room. Meanwhile try to have the patient scheduled for a CT scan tomorrow. I recommended that he discontinue metformin.  Admission on 11/27/2015, Discharged on 11/27/2015  Component Date Value Ref Range Status  . Lipase 11/27/2015 108* 11 - 51 U/L Final  . Sodium 11/27/2015 141  135 - 145 mmol/L Final  . Potassium 11/27/2015 4.1  3.5 - 5.1 mmol/L Final  . Chloride 11/27/2015 108  101 - 111 mmol/L Final  . CO2 11/27/2015 25  22 - 32 mmol/L Final  . Glucose, Bld 11/27/2015 145* 65 - 99 mg/dL Final  . BUN 11/27/2015 17  6 - 20 mg/dL Final  . Creatinine, Ser 11/27/2015 1.53* 0.61 - 1.24 mg/dL Final  . Calcium 11/27/2015 9.3  8.9 - 10.3 mg/dL Final  . Total Protein 11/27/2015 6.5  6.5 - 8.1 g/dL Final  . Albumin 11/27/2015 4.1  3.5 - 5.0 g/dL Final  .  AST 11/27/2015 23  15 - 41 U/L Final  . ALT 11/27/2015 15* 17 - 63 U/L Final  . Alkaline Phosphatase 11/27/2015 53  38 - 126 U/L Final  . Total Bilirubin 11/27/2015 0.3  0.3 - 1.2 mg/dL Final  . GFR calc non Af Amer 11/27/2015 44* >60 mL/min Final  . GFR calc Af Amer 11/27/2015 51* >60 mL/min Final   Comment: (NOTE) The eGFR has been calculated using the CKD EPI equation. This calculation has not been validated in all clinical situations. eGFR's persistently <60 mL/min signify possible Chronic Kidney Disease.   . Anion gap 11/27/2015 8  5 - 15 Final  . WBC 11/27/2015 6.6  4.0 - 10.5 K/uL Final  . RBC 11/27/2015 4.02* 4.22 - 5.81 MIL/uL  Final  . Hemoglobin 11/27/2015 10.7* 13.0 - 17.0 g/dL Final  . HCT 11/27/2015 32.0* 39.0 - 52.0 % Final  . MCV 11/27/2015 79.6  78.0 - 100.0 fL Final  . MCH 11/27/2015 26.6  26.0 - 34.0 pg Final  . MCHC 11/27/2015 33.4  30.0 - 36.0 g/dL Final  . RDW 11/27/2015 13.5  11.5 - 15.5 % Final  . Platelets 11/27/2015 252  150 - 400 K/uL Final  . Color, Urine 11/27/2015 YELLOW  YELLOW Final  . APPearance 11/27/2015 CLEAR  CLEAR Final  . Specific Gravity, Urine 11/27/2015 1.011  1.005 - 1.030 Final  . pH 11/27/2015 5.5  5.0 - 8.0 Final  . Glucose, UA 11/27/2015 NEGATIVE  NEGATIVE mg/dL Final  . Hgb urine dipstick 11/27/2015 NEGATIVE  NEGATIVE Final  . Bilirubin Urine 11/27/2015 NEGATIVE  NEGATIVE Final  . Ketones, ur 11/27/2015 NEGATIVE  NEGATIVE mg/dL Final  . Protein, ur 11/27/2015 30* NEGATIVE mg/dL Final  . Nitrite 11/27/2015 NEGATIVE  NEGATIVE Final  . Leukocytes, UA 11/27/2015 NEGATIVE  NEGATIVE Final  . Squamous Epithelial / LPF 11/27/2015 NONE SEEN  NONE SEEN Final  . WBC, UA 11/27/2015 0-5  0 - 5 WBC/hpf Final  . RBC / HPF 11/27/2015 0-5  0 - 5 RBC/hpf Final  . Bacteria, UA 11/27/2015 RARE* NONE SEEN Final   CT revealed: IMPRESSION: 1. No acute findings. No findings to explain left lower quadrant pain. 2. Scattered colonic diverticula without evidence of diverticulitis. 3. Small gallstone stable from the prior CT. 4. Low-density renal masses consistent with cysts, left adrenal gland thickening and small right adrenal adenoma, stable from the prior CT.   The pain in his abdomen has not really improved. He continues to generally point to the center of his abdomen and states that it radiates into his left flank there are no exacerbating or alleviating factors. However he admits he has not been following the diet we discussed. He is eating fish and chicken and other heavy foods that may be aggravating his pancreas. I did a literature search today in the office to look at drugs that can  possibly cause pancreatitis. Angiotensin receptor blockers would be the highest risk for this patient. Rosuvastatin would be a low risk but also a possibility. Given the fact he has acute kidney injury, will probably be prudent to take him off angiotensin receptor blocker/hydrochlorothiazide anyway.  At that time, my plan was: I believe the patient has been having occasional bouts of pancreatitis. He states that he does not drink. There is no evidence of gallstone pancreatitis on the CT scan. Therefore I believe is likely medication induced. I will have the patient discontinue Diovan HCT and recheck here in one week. I will have him  push fluids, I want him eating a clear liquid diet including Jell-O and chicken broth. Hopefully symptoms will be improving a week. Recheck kidney function today along with a lipase  06/20/15 The patient's pain has not changed. He is still 3 on a scale of 1-10. However the pain now seems lower in the lumbar lower back rather than the thoracic spine. It radiates around in a dermatomal pattern from approximately the level of L2 around his umbilicus bilaterally. The pain can be sharp and it can be exacerbated by twisting motions. Food seems to have no impact. At that time, my plan was: Recheck lipase to make sure it is back to normal. Also monitor kidney function. However I will schedule the patient for an MRI as I believe the pain may actually be due to lumbar degenerative disc disease and I feel it's important that we diagnosed the cause of the pain so we can determine if we can simply monitor it/ignore it versus intervention.  12/01/15 MRI revealed: 1. Congenital and acquired lumbar spinal stenosis as described. 2. Mild subarticular and moderate foraminal stenosis at L2-3, worse on the left. 3. Moderate subarticular narrowing at L3-4 is worse on the left. 4. Moderate foraminal stenosis bilaterally at L3-4. 5. Mild subarticular and severe foraminal stenosis bilaterally  at L4-5. Facet spurring contributes. 6. Moderate right subarticular and foraminal narrowing at L5-S1. Left foraminal narrowing is mild.  12/01/15 Patient has done remarkably well since May and has had no pain. However on October 19, he was awoken out of sleep with intense left lower quadrant pain. He described the pain as a bath of whitening that shot from his back to his left lower quadrant. With the emergency room where a CAT scan was obtained which showed stable renal cyst, chronic adrenal nodularity, coronary artery calcifications, but no explanation for his pain. 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 Past Medical History:  Diagnosis Date  . 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 CAD (coronary artery disease), with 60-70% stenosis in RCA 03/18/2012   FFR 0.84; EF 60-65%  . 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 Diabetes mellitus without complication ([place])   . History of GI bleed January 2015   No obvious findings on EGD/colonoscopy  . Hyperlipidemia   . Hypertension   . OSA (obstructive sleep apnea) 03/18/2012   Doing better with CPAP  . Pulmonary hypertension 12/10/2010   ECHO:  Mild PH,mild LVH; PA pressures estimated 30-40 mmHg  . 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 APPENDECTOMY  1974  . CARDIAC CATHETERIZATION  03/27/2010   Moderate mid RCA lesion,right radial approach,normal EF  . CARPAL TUNNEL RELEASE    . EYE SURGERY  1960   Left eye  . HEMORRHOID SURGERY    . LEFT HEART CATHETERIZATION WITH CORONARY ANGIOGRAM N/A 03/17/2012   Procedure: LEFT HEART CATHETERIZATION WITH CORONARY ANGIOGRAM;  Surgeon: Leonie Man, MD;  Location: [facility] CATH LAB;  Service: Cardiovascular;  Laterality: N/A;  . SHOULDER ARTHROSCOPY  2006   Current Outpatient  Prescriptions on File Prior to Visit  Medication Sig Dispense Refill  . Cholecalciferol (VITAMIN D-3) 1000 UNITS CAPS Take by mouth daily. 2 capsules.    . COLCRYS 0.6 MG tablet Take 0.6 mg by mouth 2 (two) times daily as needed.  0  . ferrous sulfate 325 (65 FE) MG tablet Take 1 tablet (325 mg total) by mouth 2 (two) times daily with a meal.  3  . fish oil-omega-3 fatty acids 1000 MG capsule Take 1 g by mouth daily.    Marland Kitchen glucose blood (FREESTYLE TEST STRIPS) test strip Use to test blood sugar 2 times daily as instructed. Dx: E11.59 200 each 3  . metFORMIN (GLUCOPHAGE-XR) 500 MG 24 hr tablet TAKE 1 TABLET BY MOUTH TWICE A DAY WITH A MEAL 180 tablet 3  . Multiple Vitamin (MULTIVITAMIN WITH MINERALS) TABS Take 1 tablet by mouth daily.    Marland Kitchen oxyCODONE-acetaminophen (ROXICET) 5-325 MG tablet Take 1 tablet by mouth every 4 (four) hours as needed for severe pain. 30 tablet 0  . Potassium 99 MG TABS Take 1 tablet by mouth daily.    Marland Kitchen PRESCRIPTION MEDICATION Uses C-PAP at bedtime    . ranitidine (ZANTAC) 300 MG tablet TAKE 1 TABLET BY MOUTH AT NIGHT 90 tablet 2  . rosuvastatin (CRESTOR) 40 MG tablet Take 1 tablet (40 mg total) by mouth daily. 90 tablet 3  . tamsulosin (FLOMAX) 0.4 MG CAPS capsule Take 1 capsule (0.4 mg total) by mouth daily. 30 capsule 3  . valsartan-hydrochlorothiazide (DIOVAN-HCT) 320-25 MG tablet TAKE 1 TABLET EVERY DAY 90 tablet 3   No current facility-administered medications on file prior to visit.    No Known Allergies Social History   Social History  . Marital status: Married    Spouse name: N/A  . Number of children: N/A  . Years of education: N/A   Occupational History  . Not on file.   Social History Main Topics  . Smoking status: Former Smoker    Packs/day: 1.00    Types: Cigarettes    Quit date: 04/12/2012  . Smokeless tobacco: Never Used  . Alcohol use 0.6 oz/week    1 Standard drinks or equivalent per week  . Drug use:     Types: Marijuana     Comment:  cannibus  . Sexual activity: Not on file   Other Topics Concern  . Not on file   Social History Narrative   Married, father of 2, grandfather of 3.   He is a former smoker of about a pack to pack and half cigarettes a day -- he quit in March of this year.    He is an avid exerciser working at least 4-5 days a week doing her walking or stationary bike.      Review of Systems  All other systems reviewed and are negative.      Objective:   Physical Exam  Constitutional: He appears well-developed and well-nourished.  Neck: Neck supple. No JVD present. No thyromegaly present.  Cardiovascular: Normal rate, regular rhythm, normal heart sounds and intact distal pulses.   No murmur heard. Pulmonary/Chest: Effort normal and breath sounds normal. No respiratory distress. He has no wheezes. He has no rales.  Abdominal: Soft. Bowel sounds are normal. He exhibits no distension and no mass. There is no tenderness. There is no rebound and no guarding.  Musculoskeletal: He exhibits no edema.       Lumbar back: He exhibits decreased range of motion and pain. He exhibits no bony tenderness and no spasm.  Lymphadenopathy:    He has no cervical adenopathy.  Vitals reviewed.         Assessment & Plan:  DDD (degenerative disc disease), lumbar - Plan: predniSONE (DELTASONE) 20 MG tablet, oxyCODONE-acetaminophen (ROXICET) 5-325  MG tablet  Patient has moderate foraminal stenosis at L2-L3 and L3-L4 which I believe is causing his lumbar radicular pain and radiating pain to his left lower quadrant. I will place the patient on a prednisone taper pack and give him a temporary prescription for oxycodone. If the pain does not improve, consider referral for epidural steroid injections. Of note the patient was mildly anemic in the hospital at 10.7. It is borderline microcytic but he is taking iron. He does have an underlying history of chronic kidney disease. However I want to obtain his GI records to see if  he's had an EGD and a colonoscopy recently. I would like the patient to be on an aspirin given his history of CAD and what we are seeing on a CT scan that I do not want him to on an aspirin if he has a history of a GI bleed. Therefore I will await his GI records prior to deciding on aspirin

## 2015-12-18 ENCOUNTER — Other Ambulatory Visit: Payer: Self-pay | Admitting: Family Medicine

## 2015-12-18 DIAGNOSIS — M5136 Other intervertebral disc degeneration, lumbar region: Secondary | ICD-10-CM

## 2016-01-25 ENCOUNTER — Other Ambulatory Visit: Payer: Self-pay | Admitting: Internal Medicine

## 2016-01-28 ENCOUNTER — Ambulatory Visit (INDEPENDENT_AMBULATORY_CARE_PROVIDER_SITE_OTHER): Payer: Medicare Other | Admitting: Internal Medicine

## 2016-01-28 ENCOUNTER — Encounter: Payer: Self-pay | Admitting: Internal Medicine

## 2016-01-28 VITALS — BP 128/70 | HR 76 | Ht 68.0 in | Wt 200.2 lb

## 2016-01-28 DIAGNOSIS — I2510 Atherosclerotic heart disease of native coronary artery without angina pectoris: Secondary | ICD-10-CM | POA: Diagnosis not present

## 2016-01-28 DIAGNOSIS — E1159 Type 2 diabetes mellitus with other circulatory complications: Secondary | ICD-10-CM

## 2016-01-28 LAB — POCT GLYCOSYLATED HEMOGLOBIN (HGB A1C): Hemoglobin A1C: 6.4

## 2016-01-28 NOTE — Progress Notes (Signed)
Patient ID: James Jones, male   DOB: 1946-01-24, 70 y.o.   MRN: [id number]  HPI: James Jones is a 70 y.o.-year-old male, returning for f/u for DM2, dx in ~2005, non-insulin-dependent, uncontrolled, with complications (heart ds., mild CKD). Last visit 3 mo ago. PCP: Dr. Lynnea FerrierWarren Pickard   He is exercising 3x a week ([facility]) - last 2-3 years.  Last hemoglobin A1c was: Lab Results  Component Value Date   HGBA1C 6.4 01/28/2016   HGBA1C 6.2 10/29/2015   HGBA1C 5.9 08/01/2015  04/11/2014: HbA1c 8.4% 01/08/2014: HbA1c 7.7% He had steroid inj for gout in his elbows - 12/2013. He also had a steroid inj in knee fall 2016, too.   Pt is on a regimen of: - Metformin ER 500 mg 2x a day.  We stopped Glipizide ER 2.5 mg daily in 07/2015. Had nausea, vomiting, loss of appetite with regular metformin.  He was on Actos. He also tried Metformin in the past >> unclear why.  He was on insulin before (Lantus) - came off years ago.  Pt checks his sugars 1-3x a day - great: - am: 93-123 >> 97-128 >> 79-115, 128 >> 87-111 >> 84-104 >> 89-117 - 2h after b'fast: 97-199 (after pancakes) >> 105-140 >> 91, 153 >> 147 >> n/c >> 76 - before lunch: 82-108 >> 77, 100-138, 168 (candy) >> n/c >> 89-122 >> 102-105, 190 (pie) - 2h after lunch: 114-124 >> 150 >> 77-126, 163 >> 99, 101 >> 102 >> 115, 118 - before dinner:69, 84-109 >> 66-118, 151x1 >> 64-112 >> 86, 110 >> 71-107 - 2h after dinner: 80-153 >> n/c >> 68-117 >> 111 >> 69 x1, 87-142 >> 156 - bedtime: 80-112, 183 >> 80, 174 >> 67, 97, 172 >> 126 >> 105 >> 81, 98 - nighttime: n/c >> 80 >> 105 >> 100 >> 89 Lowest 60 >> 69 >> 66 - delayed meals >> 64 >> 69 >> 81; he has hypoglycemia awareness at 70.  Highest sugar was 200x1 >> 168 >> 199 >> 174 >> 172x1 >> 147 >> 142 >> 190x1.  Glucometer: Freestyle  Pt's meals are: - Breakfast: bacon + eggs, sausage, grits, oatmeal, cereal, toast wheat - Lunch: BLT, soups, fruit, nuts - Dinner: chicken,  pork + greens, rice  - Snacks: 1 a day: pretzels; carrots  - + mild CKD, last BUN/creatinine:  Lab Results  Component Value Date   BUN 17 11/27/2015   CREATININE 1.53 (H) 11/27/2015  05/24/2014: 34/1.64 01/08/2014: 18/1.15 On Valsartan. - last set of lipids: Lab Results  Component Value Date   CHOL 140 08/01/2015   HDL 58.70 08/01/2015   LDLCALC 58 08/01/2015   TRIG 118.0 08/01/2015   CHOLHDL 2 08/01/2015  04/11/2014: 206/143/70/106 01/08/2014: 161/98/60/73 He is on Rosuvastatin 40 mg daily.  - last eye exam was in 09/2015. No DR. Had cataracts removed 01/09/2015 >> implant in R eye. He had the other cataract removed 02/2015. Has new eye exam 02/2016. - no numbness and tingling in his feet.  Last TSH 1.478 (05/24/2014).  ROS: Constitutional: no weight gain/loss, no fatigue, no subjective hyperthermia/hypothermia, no nocturia Eyes: no blurry vision, no xerophthalmia ENT: no sore throat, no nodules palpated in throat, no dysphagia/odynophagia, no hoarseness Cardiovascular: no CP/SOB/palpitations/leg swelling Respiratory: no cough/SOB Gastrointestinal: no N/V/D/C/heartburn Musculoskeletal: no muscle/joint aches Skin: no rashes Neurological: no tremors/numbness/tingling/dizziness  I reviewed pt's medications, allergies, PMH, social hx, family hx, and changes were documented in the history of present illness. Otherwise, unchanged  from my initial visit note:  Past Medical History:  Diagnosis Date  . 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 CAD (coronary artery disease), with 60-70% stenosis in RCA 03/18/2012   FFR 0.84; EF 60-65%  . 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 Diabetes mellitus without complication ([facility])   . History of GI bleed January 2015   No obvious findings on EGD/colonoscopy  . Hyperlipidemia   . Hypertension   . OSA (obstructive sleep apnea) 03/18/2012   Doing better with CPAP  . Pulmonary hypertension 12/10/2010   ECHO:   Mild PH,mild LVH; PA pressures estimated 30-40 mmHg  . 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 APPENDECTOMY  1974  . CARDIAC CATHETERIZATION  03/27/2010   Moderate mid RCA lesion,right radial approach,normal EF  . CARPAL TUNNEL RELEASE    . EYE SURGERY  1960   Left eye  . HEMORRHOID SURGERY    . LEFT HEART CATHETERIZATION WITH CORONARY ANGIOGRAM N/A 03/17/2012   Procedure: LEFT HEART CATHETERIZATION WITH CORONARY ANGIOGRAM;  Surgeon: Marykay Lex, MD;  Location[facility] CATH LAB;  Service: Cardiovascular;  Laterality: N/A;  . SHOULDER ARTHROSCOPY  2006   History   Social History  . Marital Status: Married    Spouse Name: N/A   Occupational History  . retired   Social History Main Topics  . Smoking status: Former Smoker -- 2.00 packs/day    Quit date: 04/12/2012  . Smokeless tobacco: Never Used  . Alcohol Use: 0.5 oz/week    1 drink(s) per week  . Drug Use: Yes    Special: Marijuana     Comment: cannibus   Social History Narrative   Married, father of 2, grandfather of 3.   He is a former smoker of about a pack to pack and half cigarettes a day -- he quit in March of this year.    He is an avid exerciser working at least 4-5 days a week doing her walking or stationary bike.   Current Outpatient Prescriptions on File Prior to Visit  Medication Sig Dispense Refill  . Cholecalciferol (VITAMIN D-3) 1000 UNITS CAPS Take by mouth daily. 2 capsules.    . COLCRYS 0.6 MG tablet Take 0.6 mg by mouth 2 (two) times daily as needed.   0  . ferrous sulfate 325 (65 FE) MG tablet Take 1 tablet (325 mg total) by mouth 2 (two) times daily with a meal.  3  . fish oil-omega-3 fatty acids 1000 MG capsule Take 1 g by mouth daily.    Marland Kitchen glucose blood (FREESTYLE TEST STRIPS) test strip Use to test blood sugar 2 times daily as instructed. Dx: E11.59  200 each 3  . metFORMIN (GLUCOPHAGE-XR) 500 MG 24 hr tablet TAKE 1 TABLET BY MOUTH TWICE A DAY WITH A MEAL 180 tablet 3  . Multiple Vitamin (MULTIVITAMIN WITH MINERALS) TABS Take 1 tablet by mouth daily.    Marland Kitchen oxyCODONE-acetaminophen (ROXICET) 5-325 MG tablet Take 1 tablet by mouth every 4 (four) hours as needed for severe pain. 30 tablet 0  . Potassium 99 MG TABS Take 1 tablet by mouth daily.    Marland Kitchen PRESCRIPTION MEDICATION Uses C-PAP at bedtime    . ranitidine (ZANTAC) 300 MG tablet TAKE 1 TABLET BY MOUTH AT NIGHT 90 tablet 2  . rosuvastatin (CRESTOR) 40 MG  tablet TAKE 1 TABLET BY MOUTH EVERY DAY 90 tablet 2  . valsartan-hydrochlorothiazide (DIOVAN-HCT) 320-25 MG tablet TAKE 1 TABLET EVERY DAY 90 tablet 3  . predniSONE (DELTASONE) 20 MG tablet 3 tabs poqday 1-2, 2 tabs poqday 3-4, 1 tab poqday 5-6 (Patient not taking: Reported on 01/28/2016) 12 tablet 0   No current facility-administered medications on file prior to visit.    No Known Allergies Family History  Problem Relation Age of Onset  . Kidney failure Mother   . Coronary artery disease Father   . Kidney failure Sister    PE: BP 128/70   Pulse 76   Ht 5\' 8"  (1.727 m)   Wt 200 lb 3.2 oz (90.8 kg)   SpO2 96%   BMI 30.44 kg/m  Body mass index is 30.44 kg/m. Wt Readings from Last 3 Encounters:  01/28/16 200 lb 3.2 oz (90.8 kg)  12/01/15 202 lb (91.6 kg)  11/27/15 200 lb (90.7 kg)   Constitutional: overweight, in NAD Eyes: PERRLA, EOMI, no exophthalmos ENT: moist mucous membranes, no thyromegaly, small isthmic thyroid nodule on palpation of his anterior neck , no cervical lymphadenopathy Cardiovascular: RRR, No MRG Respiratory: CTA B Gastrointestinal: abdomen soft, NT, ND, BS+ Musculoskeletal: no deformities, strength intact in all 4 Skin: moist, warm, no rashes Neurological: no tremor with outstretched hands, DTR normal in all 4  ASSESSMENT: 1. DM2, non-insulin-dependent, uncontrolled, with complications - heart ds - ? -  cardiologist Dr. Herbie BaltimoreHarding - mild CKD   2. HL  PLAN:  1. Patient with long-standing, uncontrolled diabetes, on oral antidiabetic regimen, with much improved control after adding Metformin XR so that we could stop Glipizide ER this summer. Will continue off Glipizide as his sugars are still very good with few spikes. -  I suggested to:  Patient Instructions  Please continue: - Metformin ER 500 mg 2x a day.  Please return in 3 months with your sugar log.   Your hbA1c is still good, at 6.4%!  - continue checking sugars at different times of the day - check 1-2 times a day, rotating checks - UTD with yearly eye exams - given his flu shot at last visit - check HbA1c today >> 6.4% (slightly higher, but still at goal) - Return to clinic in 3 mo with sugar log   2. HL - excellent Lipid levels at last check - continue Crestor  Carlus Pavlovristina Jayjay Littles, MD PhD [facility]

## 2016-01-28 NOTE — Patient Instructions (Signed)
Please continue: - Metformin ER 500 mg 2x a day.  Please return in 3 months with your sugar log.   Your hbA1c is still good, at 6.4%!

## 2016-02-13 DIAGNOSIS — Z961 Presence of intraocular lens: Secondary | ICD-10-CM | POA: Diagnosis not present

## 2016-02-13 DIAGNOSIS — H5212 Myopia, left eye: Secondary | ICD-10-CM | POA: Diagnosis not present

## 2016-02-13 DIAGNOSIS — E119 Type 2 diabetes mellitus without complications: Secondary | ICD-10-CM | POA: Diagnosis not present

## 2016-02-13 LAB — HM DIABETES EYE EXAM

## 2016-03-22 ENCOUNTER — Encounter: Payer: Self-pay | Admitting: Cardiology

## 2016-03-22 ENCOUNTER — Ambulatory Visit (INDEPENDENT_AMBULATORY_CARE_PROVIDER_SITE_OTHER): Payer: Medicare Other | Admitting: Cardiology

## 2016-03-22 DIAGNOSIS — I441 Atrioventricular block, second degree: Secondary | ICD-10-CM | POA: Diagnosis not present

## 2016-03-22 DIAGNOSIS — I2510 Atherosclerotic heart disease of native coronary artery without angina pectoris: Secondary | ICD-10-CM

## 2016-03-22 DIAGNOSIS — R12 Heartburn: Secondary | ICD-10-CM

## 2016-03-22 DIAGNOSIS — I10 Essential (primary) hypertension: Secondary | ICD-10-CM | POA: Diagnosis not present

## 2016-03-22 DIAGNOSIS — E785 Hyperlipidemia, unspecified: Secondary | ICD-10-CM

## 2016-03-22 NOTE — Progress Notes (Signed)
PCP: Leo Grosser, MD  Clinic Note: Chief Complaint  Patient presents with  . 1 year visit    complains of heartburn    HPI: James Jones is a 71 y.o. male with a PMH below who presents Jones for Annual follow-up for nodule to CAD with cardiac risk factors including type 2 diabetes mellitus, hypertension and hyperlipidemia.James Jones was last seen on 03/18/2015. His PCP or murmur. He otherwise only noted some positional dizziness but no real major symptoms. He was working at [employer] 3 days a week and walks 3 miles a day otherwise. I barely heard a soft murmur. He agreed to not check an echo as he had previously been joint have mild aortic sclerosis.  Recent Hospitalizations: Back pain evaluation in the ER on October 19  Studies Reviewed: None  Interval History: James Jones doing quite well. He has no major complaints. He continues to be active working out at [employer] and doing additional exercise. He walks 4 miles a day without any difficulty. Relatively stable overall from a cardiac standpoint.  Cardiovascular Review of Symptoms: No chest pain or shortness of breath with rest or exertion. No PND, orthopnea or edema. No palpitations, lightheadedness, dizziness, weakness or syncope/near syncope. No TIA/amaurosis fugax symptoms. No melena, hematochezia, hematuria, or epstaxis. No claudication.  ROS: A comprehensive was performed. Review of Systems  Constitutional: Negative for malaise/fatigue.  HENT: Negative for congestion and nosebleeds.   Respiratory: Negative for cough, sputum production and wheezing.   Gastrointestinal: Positive for heartburn (Better with H2 blocker, but does not completely take care of it.). Negative for constipation.  Genitourinary: Negative for dysuria.  Musculoskeletal: Positive for joint pain. Negative for falls and myalgias.  Skin: Negative.   Neurological: Negative for dizziness and tremors.  Psychiatric/Behavioral: Negative for  depression. The patient is not nervous/anxious and does not have insomnia.   All other systems reviewed and are negative.   Past Medical History:  Diagnosis Date  . 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 CAD (coronary artery disease), with 60-70% stenosis in RCA 03/18/2012   FFR 0.84; EF 60-65%  . 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 Diabetes mellitus without complication (HCC)   . History of GI bleed January 2015   No obvious findings on EGD/colonoscopy  . Hyperlipidemia   . Hypertension   . OSA (obstructive sleep apnea) 03/18/2012   Doing better with CPAP  . Pulmonary hypertension 12/10/2010   ECHO:  Mild PH,mild LVH; PA pressures estimated 30-40 mmHg  . 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 APPENDECTOMY  1974  . CARDIAC CATHETERIZATION  03/27/2010   Moderate mid RCA lesion,right radial approach,normal EF  . CARPAL TUNNEL RELEASE    . EYE SURGERY  1960   Left eye  . HEMORRHOID SURGERY    . LEFT HEART CATHETERIZATION WITH CORONARY ANGIOGRAM N/A 03/17/2012   Procedure: LEFT HEART CATHETERIZATION WITH CORONARY ANGIOGRAM;  Surgeon: Marykay Lex, MD;  Location: [facility] CATH LAB;  Service: Cardiovascular;  Laterality: N/A;  . SHOULDER ARTHROSCOPY  2006    Current Meds  Medication Sig  . aspirin EC 81 MG tablet Take 81 mg by mouth every other day.  . Cholecalciferol (VITAMIN D-3) 1000 UNITS CAPS Take by mouth daily. 2 capsules.  . COLCRYS 0.6 MG tablet Take  0.6 mg by mouth 2 (two) times daily as needed.   . ferrous sulfate 325 (65 FE) MG tablet Take 1 tablet (325 mg total) by mouth 2 (two) times daily with a meal.  . fish oil-omega-3 fatty acids 1000 MG capsule Take 1 g by mouth daily.  Marland Kitchen glucose blood (FREESTYLE TEST STRIPS) test strip Use to test blood sugar 2 times daily as instructed. Dx:  E11.59  . metFORMIN (GLUCOPHAGE-XR) 500 MG 24 hr tablet TAKE 1 TABLET BY MOUTH TWICE A DAY WITH A MEAL  . Multiple Vitamin (MULTIVITAMIN WITH MINERALS) TABS Take 1 tablet by mouth daily.  Marland Kitchen oxyCODONE-acetaminophen (ROXICET) 5-325 MG tablet Take 1 tablet by mouth every 4 (four) hours as needed for severe pain.  Marland Kitchen Potassium 99 MG TABS Take 1 tablet by mouth daily.  . predniSONE (DELTASONE) 20 MG tablet 3 tabs poqday 1-2, 2 tabs poqday 3-4, 1 tab poqday 5-6  . PRESCRIPTION MEDICATION Uses C-PAP at bedtime  . ranitidine (ZANTAC) 300 MG tablet TAKE 1 TABLET BY MOUTH AT NIGHT  . rosuvastatin (CRESTOR) 40 MG tablet TAKE 1 TABLET BY MOUTH EVERY DAY  . valsartan-hydrochlorothiazide (DIOVAN-HCT) 320-25 MG tablet TAKE 1 TABLET EVERY DAY    No Known Allergies  Social History   Social History  . Marital status: Married    Spouse name: N/A  . Number of children: N/A  . Years of education: N/A   Social History Main Topics  . Smoking status: Former Smoker    Packs/day: 1.00    Types: Cigarettes    Quit date: 04/12/2012  . Smokeless tobacco: Never Used  . Alcohol use 0.6 oz/week    1 Standard drinks or equivalent per week  . Drug use: Yes    Types: Marijuana     Comment: cannibus  . Sexual activity: Not Asked   Other Topics Concern  . None   Social History Narrative   Married, father of 2, grandfather of 3.   He is a former smoker of about a pack to pack and half cigarettes a day -- he quit in March of this year.    He is an avid exerciser working at least 4-5 days a week doing her walking or stationary bike.    family history includes Coronary artery disease in his father; Kidney failure in his mother and sister.  Wt Readings from Last 3 Encounters:  03/22/16 89.4 kg (197 lb 3.2 oz)  01/28/16 90.8 kg (200 lb 3.2 oz)  12/01/15 91.6 kg (202 lb)    PHYSICAL EXAM BP 131/77   Pulse 63   Ht 5\' 8"  (1.727 m)   Wt 89.4 kg (197 lb 3.2 oz)   BMI 29.98 kg/m  General appearance: alert,  cooperative, appears stated age, no distress, mildly obese and Otherwise healthy-appearing. Well-nourished and well-groomed. Answers questions appropriately.  Neck: no adenopathy, no carotid bruit, no JVD, supple, symmetrical, trachea midline and thyroid not enlarged, symmetric, no tenderness/mass/nodules  Lungs: clear to auscultation bilaterally, normal percussion bilaterally and Nonlabored, and good air movement.  Heart: regular rate and rhythm, S1 normal, Split S2  with soft SEM.  no click, rub or gallop and normal apical impulse  Abdomen: soft, non-tender; bowel sounds normal; no masses, no organomegaly  Extremities: extremities normal, atraumatic, no cyanosis or edema  Pulses: 2+ and symmetric  Neurologic: Grossly normal    Adult ECG Report  Rate: 63 ;  Rhythm: normal sinus rhythm and RBBB. 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 No beta blocker.      Relevant Medications   aspirin EC 81 MG tablet   Other Relevant Orders   EKG 12-Lead   CAD (coronary artery disease), with 60-70% stenosis in RCA (Chronic)    Stable with no active anginal symptoms. He is very active and has not had any exertional angina. Not on beta blocker due to history of bradycardia and fatigue. He is on Diovan, statin and aspirin.      Relevant Medications   aspirin EC 81 MG tablet   Other Relevant Orders   EKG 12-Lead   Dyslipidemia, goal LDL below 70 (Chronic)    Well-controlled lipids on Crestor. Monitored by PCP. Goal LDL should be close to 70 with his moderate nonobstructive coronary disease      Relevant Medications   aspirin EC 81 MG tablet   Essential  hypertension    Well-controlled on ARB-HCTZ component      Relevant Medications   aspirin EC 81 MG tablet   Other Relevant Orders   EKG 12-Lead   Heartburn    H2-blocker not really helping. I talked about using short. Courses of PI. Also for immediate relief can use Maalox or Mylanta.         Current medicines are reviewed at length with the patient Jones. (+/- concerns) n/a The following changes have been made: n/a  Patient Instructions  Your physician wants you to follow-up in: ONE YEAR with Dr. Herbie BaltimoreHarding. You will receive a reminder letter in the mail two months in advance. If you don't receive a letter, please call our office to schedule the follow-up appointment.  For Reflux: Dr. Herbie BaltimoreHarding recommends Maalox or OTC Prilosec or Nexium for 1 week  Your physician has recommended you make the following change in your medication: Take Aspirin 81mg  every other day    Studies Ordered:   Orders Placed This Encounter  Procedures  . EKG 12-Lead      Bryan Lemmaavid Emaleigh Guimond, M.D., M.S. Interventional Cardiologist   Pager # [phone] Phone # [phone] [address] [place], [place] [address]

## 2016-03-22 NOTE — Patient Instructions (Addendum)
Your physician wants you to follow-up in: ONE YEAR with Dr. Herbie BaltimoreHarding. You will receive a reminder letter in the mail two months in advance. If you don't receive a letter, please call our office to schedule the follow-up appointment.  For Reflux: Dr. Herbie BaltimoreHarding recommends Maalox or OTC Prilosec or Nexium for 1 week  Your physician has recommended you make the following change in your medication: Take Aspirin 81mg  every other day

## 2016-03-24 ENCOUNTER — Encounter: Payer: Self-pay | Admitting: Cardiology

## 2016-03-24 DIAGNOSIS — R12 Heartburn: Secondary | ICD-10-CM | POA: Insufficient documentation

## 2016-03-24 NOTE — Assessment & Plan Note (Signed)
Well-controlled lipids on Crestor. Monitored by PCP. Goal LDL should be close to 70 with his moderate nonobstructive coronary disease

## 2016-03-24 NOTE — Assessment & Plan Note (Signed)
Well-controlled on ARB-HCTZ component

## 2016-03-24 NOTE — Assessment & Plan Note (Signed)
H2-blocker not really helping. I talked about using short. Courses of PI. Also for immediate relief can use Maalox or Mylanta.

## 2016-03-24 NOTE — Assessment & Plan Note (Signed)
Stable with no active anginal symptoms. He is very active and has not had any exertional angina. Not on beta blocker due to history of bradycardia and fatigue. He is on Diovan, statin and aspirin.

## 2016-03-24 NOTE — Assessment & Plan Note (Signed)
Asymptomatic. No beta blocker.

## 2016-04-08 ENCOUNTER — Other Ambulatory Visit: Payer: Self-pay | Admitting: Family Medicine

## 2016-04-08 ENCOUNTER — Other Ambulatory Visit: Payer: Medicare Other

## 2016-04-08 DIAGNOSIS — E669 Obesity, unspecified: Secondary | ICD-10-CM

## 2016-04-08 DIAGNOSIS — Z79899 Other long term (current) drug therapy: Secondary | ICD-10-CM

## 2016-04-08 DIAGNOSIS — Z125 Encounter for screening for malignant neoplasm of prostate: Secondary | ICD-10-CM

## 2016-04-08 DIAGNOSIS — Z0000 Encounter for general adult medical examination without abnormal findings: Secondary | ICD-10-CM

## 2016-04-08 DIAGNOSIS — I2510 Atherosclerotic heart disease of native coronary artery without angina pectoris: Secondary | ICD-10-CM | POA: Diagnosis not present

## 2016-04-08 DIAGNOSIS — E119 Type 2 diabetes mellitus without complications: Secondary | ICD-10-CM

## 2016-04-08 DIAGNOSIS — E785 Hyperlipidemia, unspecified: Secondary | ICD-10-CM

## 2016-04-08 DIAGNOSIS — I10 Essential (primary) hypertension: Secondary | ICD-10-CM

## 2016-04-08 LAB — PSA: PSA: 1 ng/mL (ref <=4.0)

## 2016-04-08 LAB — COMPLETE METABOLIC PANEL WITH GFR
ALT: 14 U/L (ref 9–46)
AST: 25 U/L (ref 10–35)
Albumin: 4.3 g/dL (ref 3.6–5.1)
Alkaline Phosphatase: 58 U/L (ref 40–115)
BUN: 25 mg/dL (ref 7–25)
CO2: 22 mmol/L (ref 20–31)
Calcium: 9.4 mg/dL (ref 8.6–10.3)
Chloride: 106 mmol/L (ref 98–110)
Creat: 1.79 mg/dL — ABNORMAL HIGH (ref 0.70–1.18)
GFR, EST NON AFRICAN AMERICAN: 37 mL/min — AB (ref >=60)
GFR, Est African American: 43 mL/min — ABNORMAL LOW (ref >=60)
Glucose, Bld: 97 mg/dL (ref 70–99)
POTASSIUM: 4.5 mmol/L (ref 3.5–5.3)
SODIUM: 139 mmol/L (ref 135–146)
TOTAL PROTEIN: 6.7 g/dL (ref 6.1–8.1)
Total Bilirubin: 0.4 mg/dL (ref 0.2–1.2)

## 2016-04-08 LAB — TSH: TSH: 1.82 mIU/L (ref 0.40–4.50)

## 2016-04-08 LAB — CBC WITH DIFFERENTIAL/PLATELET
BASOS ABS: 0 {cells}/uL (ref 0–200)
Basophils Relative: 0 %
Eosinophils Absolute: 62 cells/uL (ref 15–500)
Eosinophils Relative: 1 %
HCT: 34.5 % — ABNORMAL LOW (ref 38.5–50.0)
Hemoglobin: 11.3 g/dL — ABNORMAL LOW (ref 13.0–17.0)
LYMPHS PCT: 24 %
Lymphs Abs: 1488 cells/uL (ref 850–3900)
MCH: 27.5 pg (ref 27.0–33.0)
MCHC: 32.8 g/dL (ref 32.0–36.0)
MCV: 83.9 fL (ref 80.0–100.0)
MONOS PCT: 7 %
MPV: 8.7 fL (ref 7.5–12.5)
Monocytes Absolute: 434 cells/uL (ref 200–950)
NEUTROS PCT: 68 %
Neutro Abs: 4216 cells/uL (ref 1500–7800)
PLATELETS: 253 10*3/uL (ref 140–400)
RBC: 4.11 MIL/uL — ABNORMAL LOW (ref 4.20–5.80)
RDW: 14.8 % (ref 11.0–15.0)
WBC: 6.2 10*3/uL (ref 3.8–10.8)

## 2016-04-08 LAB — LIPID PANEL
Cholesterol: 120 mg/dL (ref <200)
HDL: 67 mg/dL (ref >40)
LDL CALC: 34 mg/dL (ref <100)
TRIGLYCERIDES: 94 mg/dL (ref <150)
Total CHOL/HDL Ratio: 1.8 Ratio (ref <5.0)
VLDL: 19 mg/dL (ref <30)

## 2016-04-09 LAB — MICROALBUMIN / CREATININE URINE RATIO
CREATININE, URINE: 107 mg/dL (ref 20–370)
MICROALB UR: 23.4 mg/dL
MICROALB/CREAT RATIO: 219 ug/mg{creat} — AB (ref <30)

## 2016-04-12 DIAGNOSIS — M109 Gout, unspecified: Secondary | ICD-10-CM | POA: Diagnosis not present

## 2016-04-12 DIAGNOSIS — N281 Cyst of kidney, acquired: Secondary | ICD-10-CM | POA: Diagnosis not present

## 2016-04-12 DIAGNOSIS — N183 Chronic kidney disease, stage 3 (moderate): Secondary | ICD-10-CM | POA: Diagnosis not present

## 2016-04-13 ENCOUNTER — Encounter: Payer: Self-pay | Admitting: Family Medicine

## 2016-04-13 ENCOUNTER — Other Ambulatory Visit: Payer: Self-pay | Admitting: Internal Medicine

## 2016-04-13 ENCOUNTER — Ambulatory Visit (INDEPENDENT_AMBULATORY_CARE_PROVIDER_SITE_OTHER): Payer: Medicare Other | Admitting: Family Medicine

## 2016-04-13 ENCOUNTER — Encounter: Payer: Self-pay | Admitting: Internal Medicine

## 2016-04-13 VITALS — BP 122/68 | HR 64 | Temp 97.7°F | Resp 16 | Ht 68.0 in | Wt 194.0 lb

## 2016-04-13 DIAGNOSIS — E119 Type 2 diabetes mellitus without complications: Secondary | ICD-10-CM

## 2016-04-13 DIAGNOSIS — I2510 Atherosclerotic heart disease of native coronary artery without angina pectoris: Secondary | ICD-10-CM | POA: Diagnosis not present

## 2016-04-13 DIAGNOSIS — E785 Hyperlipidemia, unspecified: Secondary | ICD-10-CM | POA: Diagnosis not present

## 2016-04-13 DIAGNOSIS — Z0000 Encounter for general adult medical examination without abnormal findings: Secondary | ICD-10-CM | POA: Diagnosis not present

## 2016-04-13 DIAGNOSIS — D638 Anemia in other chronic diseases classified elsewhere: Secondary | ICD-10-CM | POA: Diagnosis not present

## 2016-04-13 DIAGNOSIS — Z23 Encounter for immunization: Secondary | ICD-10-CM | POA: Diagnosis not present

## 2016-04-13 MED ORDER — GLUCOSE BLOOD VI STRP: ORAL_STRIP | 11 refills | Status: DC

## 2016-04-13 MED ORDER — FREESTYLE LIBRE SENSOR SYSTEM MISC: 1.0000 | 11 refills | Status: DC

## 2016-04-13 MED ORDER — FREESTYLE LIBRE READER DEVI
1.0000 | Freq: Three times a day (TID) | 1 refills | Status: DC
Start: 2016-04-13 — End: 2016-04-14

## 2016-04-13 MED ORDER — SITAGLIPTIN PHOSPHATE 100 MG PO TABS: 100.0000 mg | ORAL_TABLET | Freq: Every day | ORAL | 5 refills | Status: DC

## 2016-04-13 NOTE — Addendum Note (Signed)
Addended by: Legrand RamsWILLIS, Denaisha Swango B on: 04/13/2016 12:28 PM   Modules accepted: Orders

## 2016-04-13 NOTE — Progress Notes (Signed)
Subjective:    Patient ID: James Jones, male    DOB: [birth year], 71 y.o.   MRN: [id number]  HPI  Here today for complete physical exam. Recently his renal function has declined in his glomerular filtration rate is now barely 40 mL of blood per minute making him a poor candidate for metformin which he takes for diabetes mellitus. His last hemoglobin A1c was 6.4 in December. Immunizations are up-to-date except for Prevnar 13 and the tetanus shot. He declines a tetanus shot but he would like to receive Prevnar 13. His colonoscopy was performed 3 years ago and is up-to-date. He declines hepatitis C screening. His diabetic eye exam was performed approximately 1 month ago. His most recent lab work as listed below: Appointment on 04/08/2016  Component Date Value Ref Range Status  . Sodium 04/08/2016 139  135 - 146 mmol/L Final  . Potassium 04/08/2016 4.5  3.5 - 5.3 mmol/L Final  . Chloride 04/08/2016 106  98 - 110 mmol/L Final  . CO2 04/08/2016 22  20 - 31 mmol/L Final  . Glucose, Bld 04/08/2016 97  70 - 99 mg/dL Final  . BUN 04/08/2016 25  7 - 25 mg/dL Final  . Creat 04/08/2016 1.79* 0.70 - 1.18 mg/dL Final   Comment:   For patients > or = 73 years of age: The upper reference limit for Creatinine is approximately 13% higher for people identified as African-American.     . Total Bilirubin 04/08/2016 0.4  0.2 - 1.2 mg/dL Final  . Alkaline Phosphatase 04/08/2016 58  40 - 115 U/L Final  . AST 04/08/2016 25  10 - 35 U/L Final  . ALT 04/08/2016 14  9 - 46 U/L Final  . Total Protein 04/08/2016 6.7  6.1 - 8.1 g/dL Final  . Albumin 04/08/2016 4.3  3.6 - 5.1 g/dL Final  . Calcium 04/08/2016 9.4  8.6 - 10.3 mg/dL Final  . GFR, Est African American 04/08/2016 43* >=60 mL/min Final  . GFR, Est Non African American 04/08/2016 37* >=60 mL/min Final  . TSH 04/08/2016 1.82  0.40 - 4.50 mIU/L Final  . Cholesterol 04/08/2016 120  <200 mg/dL Final  . Triglycerides 04/08/2016 94  <150 mg/dL Final  . HDL  04/08/2016 67  >40 mg/dL Final  . Total CHOL/HDL Ratio 04/08/2016 1.8  <5.0 Ratio Final  . VLDL 04/08/2016 19  <30 mg/dL Final  . LDL Cholesterol 04/08/2016 34  <100 mg/dL Final  . WBC 04/08/2016 6.2  3.8 - 10.8 K/uL Final  . RBC 04/08/2016 4.11* 4.20 - 5.80 MIL/uL Final  . Hemoglobin 04/08/2016 11.3* 13.0 - 17.0 g/dL Final  . HCT 04/08/2016 34.5* 38.5 - 50.0 % Final  . MCV 04/08/2016 83.9  80.0 - 100.0 fL Final  . MCH 04/08/2016 27.5  27.0 - 33.0 pg Final  . MCHC 04/08/2016 32.8  32.0 - 36.0 g/dL Final  . RDW 04/08/2016 14.8  11.0 - 15.0 % Final  . Platelets 04/08/2016 253  140 - 400 K/uL Final  . MPV 04/08/2016 8.7  7.5 - 12.5 fL Final  . Neutro Abs 04/08/2016 4216  1,500 - 7,800 cells/uL Final  . Lymphs Abs 04/08/2016 1488  850 - 3,900 cells/uL Final  . Monocytes Absolute 04/08/2016 434  200 - 950 cells/uL Final  . Eosinophils Absolute 04/08/2016 62  15 - 500 cells/uL Final  . Basophils Absolute 04/08/2016 0  0 - 200 cells/uL Final  . Neutrophils Relative % 04/08/2016 68  % Final  . Lymphocytes Relative  04/08/2016 24  % Final  . Monocytes Relative 04/08/2016 7  % Final  . Eosinophils Relative 04/08/2016 1  % Final  . Basophils Relative 04/08/2016 0  % Final  . Smear Review 04/08/2016 Criteria for review not met   Final  . PSA 04/08/2016 1.0  <=4.0 ng/mL Final   Comment:   The total PSA value from this assay system is standardized against the WHO standard. The test result will be approximately 20% lower when compared to the equimolar-standardized total PSA (Beck[employer] Coulter). Comparison of serial PSA results should be interpreted with this fact in mind.   This test was performed using the Siemens chemiluminescent method. Values obtained from different assay methods cannot be used interchangeably. PSA levels, regardless of value, should not be interpreted as absolute evidence of the presence or absence of disease.     . Creatinine, Urine 04/08/2016 107  20 - 370 mg/dL Final    . Microalb, Ur 04/08/2016 23.4  Not estab mg/dL Final  . Microalb Creat Ratio 04/08/2016 219* <30 mcg/mg creat Final   Comment: The ADA has defined abnormalities in albumin excretion as follows:           Category           Result                            (mcg/mg creatinine)                 Normal:    <30       Microalbuminuria:    30 - 299   Clinical albuminuria:    > or = 300   The ADA recommends that at least two of three specimens collected within a 3 - 6 month period be abnormal before considering a patient to be within a diagnostic category.      Past Medical History:  Diagnosis Date  . 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 CAD (coronary artery disease), with 60-70% stenosis in RCA 03/18/2012   FFR 0.84; EF 60-65%  . 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 Diabetes mellitus without complication ([place])   . History of GI bleed January 2015   No obvious findings on EGD/colonoscopy  . Hyperlipidemia   . Hypertension   . OSA (obstructive sleep apnea) 03/18/2012   Doing better with CPAP  . Pulmonary hypertension 12/10/2010   ECHO:  Mild PH,mild LVH; PA pressures estimated 30-40 mmHg  . 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 APPENDECTOMY  1974  . CARDIAC CATHETERIZATION  03/27/2010   Moderate mid RCA lesion,right radial approach,normal EF  . CARPAL TUNNEL RELEASE    . EYE SURGERY  1960   Left eye  . HEMORRHOID SURGERY    . LEFT HEART CATHETERIZATION WITH CORONARY ANGIOGRAM N/A 03/17/2012   Procedure: LEFT HEART CATHETERIZATION WITH CORONARY ANGIOGRAM;  Surgeon: Leonie Man, MD;  Location: [facility] CATH LAB;  Service: Cardiovascular;  Laterality: N/A;  . SHOULDER ARTHROSCOPY  2006   Current Outpatient Prescriptions on File Prior to Visit  Medication Sig Dispense Refill  . aspirin EC 81  MG tablet Take 81 mg by mouth every other day.    . Cholecalciferol (VITAMIN D-3) 1000 UNITS CAPS Take by mouth daily. 2 capsules.    Marland Kitchen  COLCRYS 0.6 MG tablet Take 0.6 mg by mouth 2 (two) times daily as needed.   0  . ferrous sulfate 325 (65 FE) MG tablet Take 1 tablet (325 mg total) by mouth 2 (two) times daily with a meal.  3  . fish oil-omega-3 fatty acids 1000 MG capsule Take 1 g by mouth daily.    Marland Kitchen glucose blood (FREESTYLE TEST STRIPS) test strip Use to test blood sugar 2 times daily as instructed. Dx: E11.59 200 each 3  . metFORMIN (GLUCOPHAGE-XR) 500 MG 24 hr tablet TAKE 1 TABLET BY MOUTH TWICE A DAY WITH A MEAL 180 tablet 3  . Multiple Vitamin (MULTIVITAMIN WITH MINERALS) TABS Take 1 tablet by mouth daily.    Marland Kitchen oxyCODONE-acetaminophen (ROXICET) 5-325 MG tablet Take 1 tablet by mouth every 4 (four) hours as needed for severe pain. 30 tablet 0  . Potassium 99 MG TABS Take 1 tablet by mouth daily.    Marland Kitchen PRESCRIPTION MEDICATION Uses C-PAP at bedtime    . ranitidine (ZANTAC) 300 MG tablet TAKE 1 TABLET BY MOUTH AT NIGHT 90 tablet 2  . rosuvastatin (CRESTOR) 40 MG tablet TAKE 1 TABLET BY MOUTH EVERY DAY 90 tablet 2  . valsartan-hydrochlorothiazide (DIOVAN-HCT) 320-25 MG tablet TAKE 1 TABLET EVERY DAY 90 tablet 3   No current facility-administered medications on file prior to visit.    No Known Allergies Social History   Social History  . Marital status: Married    Spouse name: N/A  . Number of children: N/A  . Years of education: N/A   Occupational History  . Not on file.   Social History Main Topics  . Smoking status: Former Smoker    Packs/day: 1.00    Types: Cigarettes    Quit date: 04/12/2012  . Smokeless tobacco: Never Used  . Alcohol use 0.6 oz/week    1 Standard drinks or equivalent per week  . Drug use: Yes    Types: Marijuana     Comment: cannibus  . Sexual activity: Not on file   Other Topics Concern  . Not on file   Social History Narrative   Married, father of  2, grandfather of 3.   He is a former smoker of about a pack to pack and half cigarettes a day -- he quit in March of this year.    He is an avid exerciser working at least 4-5 days a week doing her walking or stationary bike.   Family History  Problem Relation Age of Onset  . Kidney failure Mother   . Coronary artery disease Father   . Kidney failure Sister      Review of Systems  All other systems reviewed and are negative.      Objective:   Physical Exam  Constitutional: He is oriented to person, place, and time. He appears well-developed and well-nourished. No distress.  HENT:  Head: Normocephalic and atraumatic.  Right Ear: External ear normal.  Left Ear: External ear normal.  Nose: Nose normal.  Mouth/Throat: Oropharynx is clear and moist. No oropharyngeal exudate.  Eyes: Conjunctivae and EOM are normal. Pupils are equal, round, and reactive to light. Right eye exhibits no discharge. Left eye exhibits no discharge. No scleral icterus.  Neck: Normal range of motion. Neck supple. No JVD present. No tracheal deviation present. No thyromegaly present.  Cardiovascular: Normal rate, regular rhythm, normal heart sounds and intact distal pulses.  Exam reveals no gallop and no friction rub.   No murmur heard. Pulmonary/Chest: Effort  normal and breath sounds normal. No stridor. No respiratory distress. He has no wheezes. He has no rales. He exhibits no tenderness.  Abdominal: Soft. Bowel sounds are normal. He exhibits no distension and no mass. There is no tenderness. There is no rebound and no guarding.  Genitourinary: Rectum normal and prostate normal.  Musculoskeletal: Normal range of motion. He exhibits no edema, tenderness or deformity.  Lymphadenopathy:    He has no cervical adenopathy.  Neurological: He is alert and oriented to person, place, and time. He has normal reflexes. He displays normal reflexes. No cranial nerve deficit. He exhibits normal muscle tone. Coordination  normal.  Skin: Skin is warm. No rash noted. He is not diaphoretic. No erythema. No pallor.  Psychiatric: He has a normal mood and affect. His behavior is normal. Judgment and thought content normal.  Vitals reviewed.         Assessment & Plan:  General medical exam  Anemia of chronic disease  ASCVD (arteriosclerotic cardiovascular disease)  Type 2 diabetes mellitus without complication, without long-term current use of insulin (HCC)  Coronary artery disease involving native coronary artery of native heart without angina pectoris  Dyslipidemia, goal LDL below 70  Physical exam today is within normal limits. I am concerned by his declining kidney function. I recommended discontinuing metformin and replacing with Januvia 100 mg a day. Offered the patient a tetanus shot which he declined. He will receive Prevnar 13. Diabetic foot exam is up-to-date. Diabetic eye exam is up-to-date. Blood pressures excellent. Cholesterol is outstanding. Cancer screening is up-to-date. PSA is normal. Rectal exam is normal. Colonoscopy is up-to-date. He declines hepatitis C screening. Recheck hemoglobin A1c in 3 months on Januvia

## 2016-04-14 ENCOUNTER — Other Ambulatory Visit: Payer: Self-pay

## 2016-04-14 MED ORDER — FREESTYLE LIBRE READER DEVI: 1.0000 | Freq: Three times a day (TID) | 1 refills | Status: DC

## 2016-04-14 MED ORDER — FREESTYLE LIBRE SENSOR SYSTEM MISC: 1.0000 | 11 refills | Status: DC

## 2016-04-15 ENCOUNTER — Telehealth: Payer: Self-pay

## 2016-04-15 MED ORDER — GLUCOSE BLOOD VI STRP: ORAL_STRIP | 3 refills | Status: DC

## 2016-04-15 NOTE — Telephone Encounter (Signed)
Called and spoke with patient regarding the libre device and sensors, the insurance would not pay for this. Patient understood, and asked for more test strips to be submitted, which I did.

## 2016-04-27 ENCOUNTER — Ambulatory Visit (INDEPENDENT_AMBULATORY_CARE_PROVIDER_SITE_OTHER): Payer: Medicare Other | Admitting: Internal Medicine

## 2016-04-27 ENCOUNTER — Encounter: Payer: Self-pay | Admitting: Internal Medicine

## 2016-04-27 VITALS — BP 134/72 | HR 73 | Ht 67.0 in | Wt 196.0 lb

## 2016-04-27 DIAGNOSIS — I2510 Atherosclerotic heart disease of native coronary artery without angina pectoris: Secondary | ICD-10-CM

## 2016-04-27 DIAGNOSIS — E1159 Type 2 diabetes mellitus with other circulatory complications: Secondary | ICD-10-CM

## 2016-04-27 LAB — POCT GLYCOSYLATED HEMOGLOBIN (HGB A1C): HEMOGLOBIN A1C: 6.2

## 2016-04-27 MED ORDER — METFORMIN HCL ER 500 MG PO TB24: ORAL_TABLET | ORAL | 3 refills | Status: DC

## 2016-04-27 MED ORDER — SITAGLIPTIN PHOSPHATE 100 MG PO TABS: 50.0000 mg | ORAL_TABLET | Freq: Every day | ORAL | 5 refills | Status: DC

## 2016-04-27 NOTE — Patient Instructions (Addendum)
Please decrease: - Januvia to 50 mg daily before b'fast  When you run out of Januvia, start back: - Metformin ER 500 mg with dinner  Please return in 3 months with your sugar log.   Your HbA1c is great, at 6.2%!

## 2016-04-27 NOTE — Progress Notes (Signed)
Patient ID: James Jones, male   DOB: [birth year], 71 y.o.   MRN: [id number]  HPI: James Jones is a 71 y.o.-year-old male, returning for f/u for DM2, dx in ~2005, non-insulin-dependent, uncontrolled, with complications (heart ds., mild CKD). Last visit 3 mo ago. PCP: Dr. Lynnea FerrierWarren Pickard   Last hemoglobin A1c was: Lab Results  Component Value Date   HGBA1C 6.4 01/28/2016   HGBA1C 6.2 10/29/2015   HGBA1C 5.9 08/01/2015  04/11/2014: HbA1c 8.4% 01/08/2014: HbA1c 7.7% He had steroid inj for gout in his elbows - 12/2013. He also had a steroid inj in knee fall 2016, too.   Pt is on a regimen of: - Januvia 100 mg daily >> started by PCP 2 weeks ago  PCP stopped: - Metformin ER 500 mg 2x a day.   We stopped Glipizide ER 2.5 mg daily in 07/2015. Had nausea, vomiting, loss of appetite with regular metformin.  He was on Actos. He also tried Metformin in the past >> unclear why.  He was on insulin before (Lantus) - came off years ago.  Pt checks his sugars 1-3x a day - great: - am: 93-123 >> 97-128 >> 79-115, 128 >> 87-111 >> 84-104 >> 89-117 >> 95-116 - 2h after b'fast: 97-199 (after pancakes) >> 105-140 >> 91, 153 >> 147 >> n/c >> 76 >> n/c - before lunch: 82-108 >> 77, 100-138, 168 (candy) >> n/c >> 89-122 >> 102-105, 190 (pie) >> 72-106 - 2h after lunch: 114-124 >> 150 >> 77-126, 163 >> 99, 101 >> 102 >> 115, 118 >> 116, 122 - before dinner:69, 84-109 >> 66-118, 151x1 >> 64-112 >> 86, 110 >> 71-107 >> 79, 82 - 2h after dinner: 80-153 >> n/c >> 68-117 >> 111 >> 69 x1, 87-142 >> 156 >> 70-123 - bedtime: 80-112, 183 >> 80, 174 >> 67, 97, 172 >> 126 >> 105 >> 81, 98 >> 123, 146 (coconut cake) - nighttime: n/c >> 80 >> 105 >> 100 >> 89 >> 112 Lowest 60 >> 69 >> 66 - delayed meals >> 64 >> 69 >> 81 >> 70; he has hypoglycemia awareness at 70.  Highest sugar was 200x1 >> 168 >> 199 >> 174 >> 172x1 >> 147 >> 142 >> 190x1 >> 146  Glucometer: Freestyle [employer] did not cover Freestyle Libre  CGM.  Pt's meals are: - Breakfast: bacon + eggs, sausage, grits, oatmeal, cereal, toast wheat - Lunch: BLT, soups, fruit, nuts - Dinner: chicken, pork + greens, rice  - Snacks: 1 a day: pretzels; carrots He is still exercising 3x a week ([facility] 4 mi [facility]) - last 2-3 years.  - + CKD, last BUN/creatinine:  Lab Results  Component Value Date   BUN 25 04/08/2016   CREATININE 1.79 (H) 04/08/2016  05/24/2014: 34/1.64 01/08/2014: 18/1.15 On Valsartan. - last set of lipids: Lab Results  Component Value Date   CHOL 120 04/08/2016   HDL 67 04/08/2016   LDLCALC 34 04/08/2016   TRIG 94 04/08/2016   CHOLHDL 1.8 04/08/2016  04/11/2014: 206/143/70/106 01/08/2014: 161/98/60/73 He is on Rosuvastatin 40 mg daily.  - last eye exam was in 02/2016. No DR. Had cataracts removed 01/09/2015 >> implant in R eye. He had the other cataract removed 02/2015. - no numbness and tingling in his feet.  Last TSH  Lab Results  Component Value Date   TSH 1.82 04/08/2016   ROS: Constitutional: no weight gain/loss, no fatigue, no subjective hyperthermia/hypothermia, + nocturia Eyes: no blurry vision, no xerophthalmia ENT: no sore  throat, no nodules palpated in throat, no dysphagia/odynophagia, no hoarseness Cardiovascular: no CP/SOB/palpitations/leg swelling Respiratory: no cough/SOB Gastrointestinal: no N/V/D/C/heartburn Musculoskeletal: no muscle/+ joint aches (L knee) Skin: no rashes Neurological: no tremors/numbness/tingling/dizziness  I reviewed pt's medications, allergies, PMH, social hx, family hx, and changes were documented in the history of present illness. Otherwise, unchanged from my initial visit note:  Past Medical History:  Diagnosis Date  . 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 CAD (coronary artery disease), with 60-70% stenosis in RCA 03/18/2012   FFR 0.84; EF 60-65%  . 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 Diabetes mellitus without  complication (HCC)   . History of GI bleed January 2015   No obvious findings on EGD/colonoscopy  . Hyperlipidemia   . Hypertension   . OSA (obstructive sleep apnea) 03/18/2012   Doing better with CPAP  . Pulmonary hypertension 12/10/2010   ECHO:  Mild PH,mild LVH; PA pressures estimated 30-40 mmHg  . 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 Wenckebach 04/09/2012-05/09/2012   Event monitor; usually during sleeping hours; therefore not on beta blocker  + Gout  Past Surgical History:  Procedure Laterality Date  . APPENDECTOMY  1974  . CARDIAC CATHETERIZATION  03/27/2010   Moderate mid RCA lesion,right radial approach,normal EF  . CARPAL TUNNEL RELEASE    . EYE SURGERY  1960   Left eye  . HEMORRHOID SURGERY    . LEFT HEART CATHETERIZATION WITH CORONARY ANGIOGRAM N/A 03/17/2012   Procedure: LEFT HEART CATHETERIZATION WITH CORONARY ANGIOGRAM;  Surgeon: Marykay Lex, MD;  Location: [facility] CATH LAB;  Service: Cardiovascular;  Laterality: N/A;  . SHOULDER ARTHROSCOPY  2006   History   Social History  . Marital Status: Married    Spouse Name: N/A   Occupational History  . retired   Social History Main Topics  . Smoking status: Former Smoker -- 2.00 packs/day    Quit date: 04/12/2012  . Smokeless tobacco: Never Used  . Alcohol Use: 0.5 oz/week    1 drink(s) per week  . Drug Use: Yes    Special: Marijuana     Comment: cannibus   Social History Narrative   Married, father of 2, grandfather of 3.   He is a former smoker of about a pack to pack and half cigarettes a day -- he quit in March of this year.    He is an avid exerciser working at least 4-5 days a week doing her walking or stationary bike.   Current Outpatient Prescriptions on File Prior to Visit  Medication Sig Dispense Refill  . aspirin EC 81 MG tablet Take 81 mg by mouth every other day.    . Cholecalciferol (VITAMIN D-3) 1000 UNITS CAPS Take by mouth daily. 2 capsules.    . COLCRYS 0.6 MG tablet Take 0.6 mg by mouth 2 (two) times  daily as needed.   0  . Continuous Blood Gluc Receiver (FREESTYLE LIBRE READER) DEVI 1 Device by Does not apply route 3 (three) times daily. Dx- E11.9 1 Device 1  . Continuous Blood Gluc Sensor (FREESTYLE LIBRE SENSOR SYSTEM) MISC 1 Device by Does not apply route every 30 (thirty) days. 3 each 11  . ferrous sulfate 325 (65 FE) MG tablet Take 1 tablet (325 mg total) by mouth 2 (two) times daily with a meal.  3  . fish oil-omega-3 fatty acids 1000 MG capsule Take 1 g by mouth daily.    Marland Kitchen glucose blood (FREESTYLE PRECISION NEO TEST)  test strip Use 1x a day - to use with the [facility] receiver/glucometer 50 each 11  . glucose blood (FREESTYLE TEST STRIPS) test strip Use to test blood sugar 2 times daily as instructed. Dx: E11.59 200 each 3  . metFORMIN (GLUCOPHAGE-XR) 500 MG 24 hr tablet TAKE 1 TABLET BY MOUTH TWICE A DAY WITH A MEAL 180 tablet 3  . Multiple Vitamin (MULTIVITAMIN WITH MINERALS) TABS Take 1 tablet by mouth daily.    Marland Kitchen oxyCODONE-acetaminophen (ROXICET) 5-325 MG tablet Take 1 tablet by mouth every 4 (four) hours as needed for severe pain. 30 tablet 0  . Potassium 99 MG TABS Take 1 tablet by mouth daily.    Marland Kitchen PRESCRIPTION MEDICATION Uses C-PAP at bedtime    . ranitidine (ZANTAC) 300 MG tablet TAKE 1 TABLET BY MOUTH AT NIGHT 90 tablet 2  . rosuvastatin (CRESTOR) 40 MG tablet TAKE 1 TABLET BY MOUTH EVERY DAY 90 tablet 2  . sitaGLIPtin (JANUVIA) 100 MG tablet Take 1 tablet (100 mg total) by mouth daily. Stop metformin 30 tablet 5  . valsartan-hydrochlorothiazide (DIOVAN-HCT) 320-25 MG tablet TAKE 1 TABLET EVERY DAY 90 tablet 3   No current facility-administered medications on file prior to visit.    No Known Allergies Family History  Problem Relation Age of Onset  . Kidney failure Mother   . Coronary artery disease Father   . Kidney failure Sister    PE: There were no vitals taken for this visit. There is no height or weight on file to calculate BMI. Wt Readings from Last 3  Encounters:  04/13/16 194 lb (88 kg)  03/22/16 197 lb 3.2 oz (89.4 kg)  01/28/16 200 lb 3.2 oz (90.8 kg)   Constitutional: overweight, in NAD Eyes: PERRLA, EOMI, no exophthalmos ENT: moist mucous membranes, no thyromegaly, small isthmic thyroid nodule on palpation of his anterior neck , no cervical lymphadenopathy Cardiovascular: RRR, No MRG Respiratory: CTA B Gastrointestinal: abdomen soft, NT, ND, BS+ Musculoskeletal: no deformities, strength intact in all 4 Skin: moist, warm, no rashes Neurological: no tremor with outstretched hands, DTR normal in all 4  ASSESSMENT: 1. DM2, non-insulin-dependent, uncontrolled, with complications - heart ds - ? - cardiologist Dr. Herbie Baltimore - mild CKD   2. HL  PLAN:  1. Patient with long-standing, uncontrolled diabetes, on oral antidiabetic regimen, with much improved control after adding Metformin XR so that we could stop Glipizide ER summer 2017. As his GFR was worse, PCP recently stopped Metformin and started Januvia.  We discussed that Metformin use has now been approved by FDA based on GFR, rather than creatinine - no restrictions for GFR >45, an decrease in dose needed for lower GFR levels. Therefore, we can restart a low dose Metformin, but will have him finish his Januvia. This is expensive for him. I advised him to take only 50 mg daily (as Januvia dose needs to be kidney-dose adjusted) and start 1 tab of Metformin when he finishes Januvia. -  I suggested to:  Patient Instructions  Please decrease: - Januvia to 50 mg daily before b'fast  When you run out of Januvia, start back: - Metformin ER 500 mg with dinner  Please return in 3 months with your sugar log.   Your HbA1c is great, at 6.2%!  - continue checking sugars at different times of the day - check 1-2 times a day, rotating checks - UTD with yearly eye exams - given his flu shot this season - check HbA1c today >> 6.2% (better!) -  Return to clinic in 3 mo with sugar log   2.  HL - excellent Lipid levels at last check - continue Crestor  Carlus Pavlov, MD PhD [facility] Endocr[facility]

## 2016-06-14 ENCOUNTER — Ambulatory Visit (INDEPENDENT_AMBULATORY_CARE_PROVIDER_SITE_OTHER): Payer: Medicare Other | Admitting: Family Medicine

## 2016-06-14 ENCOUNTER — Encounter: Payer: Self-pay | Admitting: Family Medicine

## 2016-06-14 VITALS — BP 118/60 | HR 60 | Resp 18 | Wt 203.4 lb

## 2016-06-14 DIAGNOSIS — I2510 Atherosclerotic heart disease of native coronary artery without angina pectoris: Secondary | ICD-10-CM

## 2016-06-14 DIAGNOSIS — M10072 Idiopathic gout, left ankle and foot: Secondary | ICD-10-CM

## 2016-06-14 MED ORDER — METFORMIN HCL ER 500 MG PO TB24: ORAL_TABLET | ORAL | 3 refills | Status: DC

## 2016-06-14 MED ORDER — RANITIDINE HCL 300 MG PO TABS: 300.0000 mg | ORAL_TABLET | Freq: Every day | ORAL | 2 refills | Status: DC

## 2016-06-14 MED ORDER — COLCRYS 0.6 MG PO TABS: 0.6000 mg | ORAL_TABLET | Freq: Two times a day (BID) | ORAL | 0 refills | Status: DC | PRN

## 2016-06-14 MED ORDER — ROSUVASTATIN CALCIUM 40 MG PO TABS: 40.0000 mg | ORAL_TABLET | Freq: Every day | ORAL | 2 refills | Status: DC

## 2016-06-14 NOTE — Patient Instructions (Addendum)
Take 1 tablet, repeat in 1 hour if needed Then 1 tablet daily for 5 days  For gout   F/U as needed

## 2016-06-14 NOTE — Progress Notes (Signed)
   Subjective:    Patient ID: James CanalesJames Campton, male    DOB: Jan 04, 1946, 71 y.o.   MRN: [id number]  Patient presents for Gout (left foot x 4 days)   Patient here with gout in the lateral aspect of his left foot for the past 4 days. He has had in this region before. In the past he is taking cold to seem as well as prednisone. He does have chronic kidney disease stage III he is followed by nephrology he is also diabetic followed by endocrinology with a last A1c is 6.2% in March. He denies any fever no injury to his foot feels like his typical gout flares.   Review Of Systems:  GEN- denies fatigue, fever, weight loss,weakness, recent illness HEENT- denies eye drainage, change in vision, nasal discharge, CVS- denies chest pain, palpitations RESP- denies SOB, cough, wheeze ABD- denies N/V, change in stools, abd pain GU- denies dysuria, hematuria, dribbling, incontinence MSK- +joint pain, muscle aches, injury Neuro- denies headache, dizziness, syncope, seizure activity       Objective:    BP 118/60 (BP Location: Right Arm, Patient Position: Sitting, Cuff Size: Large)   Pulse 60   Resp 18   Wt 203 lb 6.4 oz (92.3 kg)   BMI 31.86 kg/m  GEN- NAD, alert and oriented x3 CVS- RRR, no murmur RESP-CTAB EXT- No pedal edema , Left foot, TTP lateral aspect at midfoot, no erythema, no warmth, ankle NT, good ROM, no swelling bilat knees  Pulses- Radial, DP- 2+        Assessment & Plan:      Problem List Items Addressed This Visit    None    Visit Diagnoses    Acute idiopathic gout of left foot    -  Primary   Gout flares, with his CKD and diabetes, will try Colchicine, his GFR is above 30. Colchicine for 7 days, day 1 , 1.2 mg, then 0.6mg  x 5 days. If this does not work will treat with prednisone   Relevant Medications   COLCRYS 0.6 MG tablet      Note: This dictation was prepared with Dragon dictation along with smaller phrase technology. Any transcriptional errors that result from  this process are unintentional.

## 2016-07-20 ENCOUNTER — Ambulatory Visit: Payer: Medicare Other | Admitting: Internal Medicine

## 2016-07-27 ENCOUNTER — Encounter: Payer: Self-pay | Admitting: Family Medicine

## 2016-07-27 ENCOUNTER — Ambulatory Visit (INDEPENDENT_AMBULATORY_CARE_PROVIDER_SITE_OTHER): Payer: Medicare Other | Admitting: Family Medicine

## 2016-07-27 VITALS — BP 100/56 | HR 60 | Temp 98.5°F | Resp 18 | Ht 68.0 in | Wt 202.0 lb

## 2016-07-27 DIAGNOSIS — R109 Unspecified abdominal pain: Secondary | ICD-10-CM

## 2016-07-27 DIAGNOSIS — I2510 Atherosclerotic heart disease of native coronary artery without angina pectoris: Secondary | ICD-10-CM

## 2016-07-27 DIAGNOSIS — G8929 Other chronic pain: Secondary | ICD-10-CM | POA: Diagnosis not present

## 2016-07-27 DIAGNOSIS — M5136 Other intervertebral disc degeneration, lumbar region: Secondary | ICD-10-CM | POA: Diagnosis not present

## 2016-07-27 NOTE — Progress Notes (Signed)
Subjective:    Patient ID: James Jones, male    DOB: [birth year], 71 y.o.   MRN: [id number]  HPI4/27/17 Patient reports pain for the last 3 days. The pain begins in the left lower back/left flank area. It comes and goes in waves. It radiates to his left lower quadrant suprapubic area. There are no exacerbating or alleviating factors. Movement does not affect the pain. Nothing he does seems to trigger or alleviate the pain. The pain is not reproducible on exam. There is no tenderness to palpation in the left lower back or in the right flank. I'm unable to reproduce the pain on exam. He has no CVA tenderness. His abdomen is soft nondistended nontender with normal bowel sounds. Past medical history is significant for a GI bleed with a hemoglobin approximately 8 in 2015. CT scan was obtained at that time which revealed no pathologic process in the abdomen. There was no evidence of a AAA mentioned on the CT scan report. Patient denies any fevers or recent illnesses. Family history is significant for pancreatic cancer. His brother recently died from pancreatic cancer with similar symptoms which I was as the patient concerned.  At that time, my plan was: Patient's physical exam is unremarkable. His history sounds suspicious for a possible kidney stone given the colicky nature of the pain in the radiation of the pain from his lower back to his pelvic area. He also reports severe nausea due to the pain. This morning he woke up and decide on the toilet feeling like he needed to vomit until the pain subsided. This is concerning for possible kidney stone although he denies any gross hematuria. I'll begin by obtaining a urinalysis to evaluate for hematuria. I will also obtain a CBC CMP and a lipase to evaluate for other possible causes or signs of abdominal pain. If workup is unremarkable, I would next proceed to a CT scan of the abdomen and pelvis given the severity of the pain. At the present time the patient's pain  is 3 on a scale of 10.  Urinalysis shows no blood and it does show some protein and ketones. I want to proceed with lab work and schedule the patient for a CT scan of the abdomen and pelvis to evaluate further. I believe based on his history was most likely kidney stone. Based on his benign abdominal exam I do not believe his diverticulitis. I will have the patient start taking Flomax 0.4 mg by mouth daily and Percocet 5/325 one every 6 hours as needed for pain. Should the pain worsen I will go to the emergency room. Meanwhile try to have the patient scheduled for a CT scan tomorrow. I recommended that he discontinue metformin.  No visits with results within 1 Month(s) from this visit.  Latest known visit with results is:  Office Visit on 04/27/2016  Component Date Value Ref Range Status  . Hemoglobin A1C 04/27/2016 6.2   Final   CT revealed: IMPRESSION: 1. No acute findings. No findings to explain left lower quadrant pain. 2. Scattered colonic diverticula without evidence of diverticulitis. 3. Small gallstone stable from the prior CT. 4. Low-density renal masses consistent with cysts, left adrenal gland thickening and small right adrenal adenoma, stable from the prior CT.   The pain in his abdomen has not really improved. He continues to generally point to the center of his abdomen and states that it radiates into his left flank there are no exacerbating or alleviating factors. However he admits he  has not been following the diet we discussed. He is eating fish and chicken and other heavy foods that may be aggravating his pancreas. I did a literature search today in the office to look at drugs that can possibly cause pancreatitis. Angiotensin receptor blockers would be the highest risk for this patient. Rosuvastatin would be a low risk but also a possibility. Given the fact he has acute kidney injury, will probably be prudent to take him off angiotensin receptor blocker/hydrochlorothiazide  anyway.  At that time, my plan was: I believe the patient has been having occasional bouts of pancreatitis. He states that he does not drink. There is no evidence of gallstone pancreatitis on the CT scan. Therefore I believe is likely medication induced. I will have the patient discontinue Diovan HCT and recheck here in one week. I will have him push fluids, I want him eating a clear liquid diet including Jell-O and chicken broth. Hopefully symptoms will be improving a week. Recheck kidney function today along with a lipase  06/20/15 The patient's pain has not changed. He is still 3 on a scale of 1-10. However the pain now seems lower in the lumbar lower back rather than the thoracic spine. It radiates around in a dermatomal pattern from approximately the level of L2 around his umbilicus bilaterally. The pain can be sharp and it can be exacerbated by twisting motions. Food seems to have no impact. At that time, my plan was: Recheck lipase to make sure it is back to normal. Also monitor kidney function. However I will schedule the patient for an MRI as I believe the pain may actually be due to lumbar degenerative disc disease and I feel it's important that we diagnosed the cause of the pain so we can determine if we can simply monitor it/ignore it versus intervention.  12/01/15 MRI revealed: 1. Congenital and acquired lumbar spinal stenosis as described. 2. Mild subarticular and moderate foraminal stenosis at L2-3, worse on the left. 3. Moderate subarticular narrowing at L3-4 is worse on the left. 4. Moderate foraminal stenosis bilaterally at L3-4. 5. Mild subarticular and severe foraminal stenosis bilaterally at L4-5. Facet spurring contributes. 6. Moderate right subarticular and foraminal narrowing at L5-S1. Left foraminal narrowing is mild.  12/01/15 Patient has done remarkably well since May and has had no pain. However on October 19, he was awoken out of sleep with intense left lower quadrant  pain. He described the pain as a bath of whitening that shot from his back to his left lower quadrant. With the emergency room where a CAT scan was obtained which showed stable renal cyst, chronic adrenal nodularity, coronary artery calcifications, but no explanation for his pain. 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  AT that time, my plan was: Patient has moderate foraminal stenosis at L2-L3 and L3-L4 which I believe is causing his lumbar radicular pain and radiating pain to his left lower quadrant. I will place the patient on a prednisone taper pack and give him a temporary prescription for oxycodone. If the pain does not improve, consider referral for epidural steroid injections. Of note the patient was mildly anemic in the hospital at 10.7. It is borderline microcytic but he is taking iron. He does have an underlying history of chronic kidney disease. However I want to obtain his GI records to see if he's had an EGD and a colonoscopy recently. I would like the patient to be on an aspirin given his history  of CAD and what we are seeing on a CT scan that I do not want him to on an aspirin if he has a history of a GI bleed. Therefore I will await his GI records prior to deciding on aspirin  07/27/16 Patient continues to complain of pain in his lower back at the level of L3. He states that he hurts on a daily basis. It is made worse when he sits up in the morning. The pain radiates from the L3 area in a bandlike pattern around his back to his abdomen. However he is now starting to have pain radiating from his lower back into his substernal area. The pain lasted 5 or 10 seconds and then resolve spontaneously. However he is also having more indigestion as well as decreased appetite. He denies any angina. He is walking 4 miles every other day with no chest pain and no shortness of breath.  He also complains of lower abdominal pain every morning after sleeping throughout the  night and improves with bowel movement. He denies any blood in his stool but he does report black tarry stools however he is taking an iron supplement  Past Medical History:  Diagnosis Date  . 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 CAD (coronary artery disease), with 60-70% stenosis in RCA 03/18/2012   FFR 0.84; EF 60-65%  . 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 Diabetes mellitus without complication (HCC)   . History of GI bleed January 2015   No obvious findings on EGD/colonoscopy  . Hyperlipidemia   . Hypertension   . OSA (obstructive sleep apnea) 03/18/2012   Doing better with CPAP  . Pulmonary hypertension (HCC) 12/10/2010   ECHO:  Mild PH,mild LVH; PA pressures estimated 30-40 mmHg  . 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 APPENDECTOMY  1974  . CARDIAC CATHETERIZATION  03/27/2010   Moderate mid RCA lesion,right radial approach,normal EF  . CARPAL TUNNEL RELEASE    . EYE SURGERY  1960   Left eye  . HEMORRHOID SURGERY    . LEFT HEART CATHETERIZATION WITH CORONARY ANGIOGRAM N/A 03/17/2012   Procedure: LEFT HEART CATHETERIZATION WITH CORONARY ANGIOGRAM;  Surgeon: Marykay Lexavid W Harding, MD;  [facility];  Service: Cardiovascular;  Laterality: N/A;  . SHOULDER ARTHROSCOPY  2006   Current Outpatient Prescriptions on File Prior to Visit  Medication Sig Dispense Refill  . aspirin EC 81 MG tablet Take 81 mg by mouth every other day.    . Cholecalciferol (VITAMIN D-3) 1000 UNITS CAPS Take by mouth daily. 2 capsules.    . COLCRYS 0.6 MG tablet Take 1 tablet (0.6 mg total) by mouth 2 (two) times daily as needed. 30 tablet 0  . Continuous Blood Gluc Receiver (FREESTYLE LIBRE READER) DEVI 1 Device by Does not apply route 3 (three) times daily. Dx- E11.9 1 Device 1  . Continuous Blood Gluc Sensor  (FREESTYLE LIBRE SENSOR SYSTEM) MISC 1 Device by Does not apply route every 30 (thirty) days. 3 each 11  . ferrous sulfate 325 (65 FE) MG tablet Take 1 tablet (325 mg total) by mouth 2 (two) times daily with a meal.  3  . fish oil-omega-3 fatty acids 1000 MG capsule Take 1 g by mouth daily.    Marland Kitchen. glucose blood (FREESTYLE PRECISION NEO TEST) test strip Use  1x a day - to use with the [facility] receiver/glucometer 50 each 11  . glucose blood (FREESTYLE TEST STRIPS) test strip Use to test blood sugar 2 times daily as instructed. Dx: E11.59 200 each 3  . metFORMIN (GLUCOPHAGE-XR) 500 MG 24 hr tablet TAKE 1 TABLET BY MOUTH WITH DINNER 180 tablet 3  . Multiple Vitamin (MULTIVITAMIN WITH MINERALS) TABS Take 1 tablet by mouth daily.    . Potassium 99 MG TABS Take 1 tablet by mouth daily.    Marland Kitchen PRESCRIPTION MEDICATION Uses C-PAP at bedtime    . ranitidine (ZANTAC) 300 MG tablet Take 1 tablet (300 mg total) by mouth at bedtime. 90 tablet 2  . rosuvastatin (CRESTOR) 40 MG tablet Take 1 tablet (40 mg total) by mouth daily. 90 tablet 2  . valsartan-hydrochlorothiazide (DIOVAN-HCT) 320-25 MG tablet TAKE 1 TABLET EVERY DAY 90 tablet 3   No current facility-administered medications on file prior to visit.    No Known Allergies Social History   Social History  . Marital status: Married    Spouse name: N/A  . Number of children: N/A  . Years of education: N/A   Occupational History  . Not on file.   Social History Main Topics  . Smoking status: Former Smoker    Packs/day: 1.00    Types: Cigarettes    Quit date: 04/12/2012  . Smokeless tobacco: Never Used  . Alcohol use 0.6 oz/week    1 Standard drinks or equivalent per week  . Drug use: Yes    Types: Marijuana     Comment: cannibus  . Sexual activity: Not on file   Other Topics Concern  . Not on file   Social History Narrative   Married, father of 2, grandfather of 3.   He is a former smoker of about a pack to pack and half cigarettes a  day -- he quit in March of this year.    He is an avid exerciser working at least 4-5 days a week doing her walking or stationary bike.      Review of Systems  All other systems reviewed and are negative.      Objective:   Physical Exam  Constitutional: He appears well-developed and well-nourished.  Neck: Neck supple. No JVD present. No thyromegaly present.  Cardiovascular: Normal rate, regular rhythm, normal heart sounds and intact distal pulses.   No murmur heard. Pulmonary/Chest: Effort normal and breath sounds normal. No respiratory distress. He has no wheezes. He has no rales.  Abdominal: Soft. Bowel sounds are normal. He exhibits no distension and no mass. There is no tenderness. There is no rebound and no guarding.  Musculoskeletal: He exhibits no edema.       Lumbar back: He exhibits decreased range of motion and pain. He exhibits no bony tenderness and no spasm.  Lymphadenopathy:    He has no cervical adenopathy.  Vitals reviewed.         Assessment & Plan:  DDD (degenerative disc disease), lumbar - Plan: Ambulatory referral to Gastroenterology  Chronic abdominal pain - Plan: Ambulatory referral to Orthopedic Surgery  I believe the patient has multifactorial pain. I still believe the majority of the pain radiating from his back into his lower abdomen is musculoskeletal in nature and related to the degenerative disc disease that he has at L3. I recommended an orthopedic consultation for possible epidural steroid injections and I will arrange that. However I cannot explain indigestion, decreased appetite, and substernal chest pain based on  degenerative disc disease at L3. Furthermore I cannot explain the chronic lower abdominal pain improves with defecation in the morning. I believe some of his symptoms are likely secondary to gastroesophageal reflux disease and so started the patient on samples of Dexilant 60 mg a day. However I'm also going to consult his  gastroenterologist to rule out other possibilities of chronic abdominal pain. Some of this could be due to constipation or maybe even IBS however I would like a second opinion given the chronicity and the persistence of the abdominal pain

## 2016-08-03 DIAGNOSIS — M545 Low back pain: Secondary | ICD-10-CM | POA: Diagnosis not present

## 2016-08-09 DIAGNOSIS — R109 Unspecified abdominal pain: Secondary | ICD-10-CM | POA: Diagnosis not present

## 2016-08-09 DIAGNOSIS — K219 Gastro-esophageal reflux disease without esophagitis: Secondary | ICD-10-CM | POA: Diagnosis not present

## 2016-08-18 DIAGNOSIS — M545 Low back pain: Secondary | ICD-10-CM | POA: Diagnosis not present

## 2016-09-02 DIAGNOSIS — M545 Low back pain: Secondary | ICD-10-CM | POA: Diagnosis not present

## 2016-09-13 ENCOUNTER — Encounter: Payer: Self-pay | Admitting: Cardiovascular Disease

## 2016-09-13 ENCOUNTER — Ambulatory Visit (INDEPENDENT_AMBULATORY_CARE_PROVIDER_SITE_OTHER): Payer: Medicare Other | Admitting: Cardiovascular Disease

## 2016-09-13 VITALS — BP 132/62 | HR 54 | Ht 68.0 in | Wt 200.2 lb

## 2016-09-13 DIAGNOSIS — E669 Obesity, unspecified: Secondary | ICD-10-CM

## 2016-09-13 DIAGNOSIS — G4733 Obstructive sleep apnea (adult) (pediatric): Secondary | ICD-10-CM

## 2016-09-13 DIAGNOSIS — Z9989 Dependence on other enabling machines and devices: Secondary | ICD-10-CM | POA: Diagnosis not present

## 2016-09-13 DIAGNOSIS — H0014 Chalazion left upper eyelid: Secondary | ICD-10-CM | POA: Diagnosis not present

## 2016-09-13 DIAGNOSIS — I10 Essential (primary) hypertension: Secondary | ICD-10-CM | POA: Diagnosis not present

## 2016-09-13 DIAGNOSIS — E1159 Type 2 diabetes mellitus with other circulatory complications: Secondary | ICD-10-CM

## 2016-09-13 DIAGNOSIS — I441 Atrioventricular block, second degree: Secondary | ICD-10-CM

## 2016-09-13 NOTE — Patient Instructions (Signed)
Your physician wants you to follow-up in: 1 YEAR with Dr. Kelly (sleep clinic). You will receive a reminder letter in the mail two months in advance. If you don't receive a letter, please call our office to schedule the follow-up appointment.  

## 2016-09-13 NOTE — Progress Notes (Signed)
Patient ID: James Jones, male   DOB: Jan 29, 1946, 71 y.o.   MRN: [id number]    Primary cardiology: Dr. Glenetta Hew. Primary: Karis Juba, [place]  HPI: James Jones, is a 71 y.o. male who presents to the office today for an 14 month  f/u sleep clinic evaluation.  [name] originally from [place] to help. While in [place] in the 1990's  he was diagnosed with sleep apnea. He was given a CPAP unit. However, he never used CPAP. He has a history of hypertension, hyperlipidemia, diabetes mellitus, GERD, and remote tobacco use. He smoked for approximately 45 years but quit smoking in March 2000.  In February 2014  he was referred for a split-night sleep study. This confirmed severe sleep apnea. At that time he has significant daytime sleepiness with an Epworth scale of 15. AHI was 69.3 per hour and REM sleep was increased at 71.5 events per hour. He dropped his O2 saturation to 81% with REM sleep and had loud snoring. He underwent a split-night evaluation. He also was prescribed an 18 cm fixed water pressure.  Since initiating CPAP therapy, he does feel he is sleeping better he typically goes to have possibly 9:30 at night and wakes up around 5 to 5:30 in the morning. A download  from 06/21/2012 through June 2014 revealed 77% of the days with usage. He admits that he did not use it when he went on vacation. His average usage on days used to 6 hours and 2 minutes. He had 70% of days greater than 4 hours. HI was excellent at 1.4 is 18 cm water pressure.  When I last saw him one year ago he was doing well.  He was compliant.  Over the past year, he continues to use CPAP regularly.  He typically goes to bed around 10 PM and wakes up between 5 and 6 AM.  I obtained a new download in the office today from June 16 through July 15.  This reveals usage stays at 87%, and uses greater than 4 hours at 77%.  He was averaging 6 hours 1 minute of sleep.  CPAP set pressure was 18 cm.  AHI was  excellent at 1.4.  There was no significant leak.  He states he exercises 3 days per week, typically walks up to 4 miles.  He denies any chest pain.  He denies palpitations.  I calculated and new Epworth scale in the office today and this endorsed at 8 with moderate chance of dozing while watching TV, sitting inactive in a public place, lying down to rest in the afternoon when circumstances persist, and slight chance of dozing as a passenger in a car for an hour without a break and will sitting quietly after lunch without alcohol.  Past Medical History:  Diagnosis Date  . 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 CAD (coronary artery disease), with 60-70% stenosis in RCA 03/18/2012   FFR 0.84; EF 60-65%  . 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 Diabetes mellitus without complication ([place])   . History of GI bleed January 2015   No obvious findings on EGD/colonoscopy  . Hyperlipidemia   . Hypertension   . OSA (obstructive sleep apnea) 03/18/2012   Doing better with CPAP  . Pulmonary hypertension ([place]) 12/10/2010   ECHO:  Mild PH,mild LVH; PA pressures estimated 30-40 mmHg  . 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 Wenckebach 04/09/2012-05/09/2012   Event monitor; usually during  sleeping hours; therefore not on beta blocker    Past Surgical History:  Procedure Laterality Date  . APPENDECTOMY  1974  . CARDIAC CATHETERIZATION  03/27/2010   Moderate mid RCA lesion,right radial approach,normal EF  . CARPAL TUNNEL RELEASE    . EYE SURGERY  1960   Left eye  . HEMORRHOID SURGERY    . LEFT HEART CATHETERIZATION WITH CORONARY ANGIOGRAM N/A 03/17/2012   Procedure: LEFT HEART CATHETERIZATION WITH CORONARY ANGIOGRAM;  Surgeon: Leonie Man, MD;  Location: [facility] CATH LAB;  Service: Cardiovascular;  Laterality: N/A;  . SHOULDER ARTHROSCOPY  2006    No Known Allergies  Current Outpatient Prescriptions  Medication Sig Dispense Refill  . aspirin EC 81 MG tablet Take 81 mg by mouth  every other day.    . Cholecalciferol (VITAMIN D-3) 1000 UNITS CAPS Take by mouth daily. 2 capsules.    . COLCRYS 0.6 MG tablet Take 1 tablet (0.6 mg total) by mouth 2 (two) times daily as needed. 30 tablet 0  . Continuous Blood Gluc Receiver (FREESTYLE LIBRE READER) DEVI 1 Device by Does not apply route 3 (three) times daily. Dx- E11.9 1 Device 1  . Continuous Blood Gluc Sensor (FREESTYLE LIBRE SENSOR SYSTEM) MISC 1 Device by Does not apply route every 30 (thirty) days. 3 each 11  . ferrous sulfate 325 (65 FE) MG tablet Take 1 tablet (325 mg total) by mouth 2 (two) times daily with a meal.  3  . fish oil-omega-3 fatty acids 1000 MG capsule Take 1 g by mouth daily.    Marland Kitchen glucose blood (FREESTYLE PRECISION NEO TEST) test strip Use 1x a day - to use with the [facility] receiver/glucometer 50 each 11  . glucose blood (FREESTYLE TEST STRIPS) test strip Use to test blood sugar 2 times daily as instructed. Dx: E11.59 200 each 3  . metFORMIN (GLUCOPHAGE-XR) 500 MG 24 hr tablet TAKE 1 TABLET BY MOUTH WITH DINNER 180 tablet 3  . Multiple Vitamin (MULTIVITAMIN WITH MINERALS) TABS Take 1 tablet by mouth daily.    . pantoprazole (PROTONIX) 40 MG tablet Take 1 tablet by mouth daily.    . Potassium 99 MG TABS Take 1 tablet by mouth daily.    Marland Kitchen PRESCRIPTION MEDICATION Uses C-PAP at bedtime    . rosuvastatin (CRESTOR) 40 MG tablet Take 1 tablet (40 mg total) by mouth daily. 90 tablet 2  . valsartan-hydrochlorothiazide (DIOVAN-HCT) 320-25 MG tablet TAKE 1 TABLET EVERY DAY 90 tablet 3   No current facility-administered medications for this visit.     Socially He is from [place]; he is has 2 children 3 grandchildren. It smoked for over 40 years and quit smoking in March 2014. Does drink occasional alcohol. He does exercise. He is originally from [place]. He is looking [place] area for 4 years  ROS General: Negative; No fevers, chills, or night sweats;  HEENT: Negative; No changes in  vision or hearing, sinus congestion, difficulty swallowing Pulmonary: Negative; No cough, wheezing, shortness of breath, hemoptysis Cardiovascular: Negative; No chest pain, presyncope, syncope, palpitations GI: Negative; No nausea, vomiting, diarrhea, or abdominal pain GU: Positive for urination at least 2 times per night. Musculoskeletal: Negative; no myalgias, joint pain, or weakness Hematologic/Oncology: Negative; no easy bruising, bleeding Endocrine: Negative; no heat/cold intolerance; no diabetes Neuro: Negative; no changes in balance, headaches Skin: Negative; No rashes or skin lesions Psychiatric: Negative; No behavioral problems, depression Sleep: Positive for severe obstructive sleep apnea on CPAP therapy.  No residual  snoring or daytime sleepiness, hypersomnolence, bruxism, restless legs, hypnogognic hallucinations, no cataplexy Other comprehensive 14 point system review is negative.  PE BP 132/62   Pulse (!) 54   Ht _0  (1.727 m)   Wt 200 lb 3.2 oz (90.8 kg)   BMI 30.44 kg/m    Wt Readings from Last 3 Encounters:  09/13/16 200 lb 3.2 oz (90.8 kg)  07/27/16 202 lb (91.6 kg)  06/14/16 203 lb 6.4 oz (92.3 kg)   General: Alert, oriented, no distress.  Skin: normal turgor, no rashes, warm and dry HEENT: Normocephalic, atraumatic. Pupils equal round and reactive to light; sclera anicteric; extraocular muscles intact;  Nose without nasal septal hypertrophy Mouth/Parynx benign;  upper dentures Mallinpatti scale 3/4 Neck: No JVD, no carotid bruits; normal carotid upstroke Lungs: clear to ausculatation and percussion; no wheezing or rales Chest wall: without tenderness to palpitation Heart: PMI not displaced, RRR, s1 s2 normal, 1/6 systolic murmur, no diastolic murmur, no rubs, gallops, thrills, or heaves Abdomen: soft, nontender; no hepatosplenomehaly, BS+; abdominal aorta nontender and not dilated by palpation. Back: no CVA tenderness Pulses 2+ Musculoskeletal: full range  of motion, normal strength, no joint deformities Extremities: no clubbing cyanosis or edema, Homan's sign negative  Neurologic: grossly nonfocal; Cranial nerves grossly wnl Psychologic: Normal mood and affect   ECG (independently read by me): Sinus bradycardia at 54 bpm.  Right bundle-branch block with repolarization changes.  Normal intervals.  LABS:  BMP Latest Ref Rng & Units 04/08/2016 11/27/2015 06/20/2015  Glucose 70 - 99 mg/dL 97 145(H) 88  BUN 7 - 25 mg/dL _1 Creatinine 0.70 - 1.18 mg/dL 1.79(H) 1.53(H) 1.32(H)  Sodium 135 - 146 mmol/L 139 141 137  Potassium 3.5 - 5.3 mmol/L 4.5 4.1 4.1  Chloride 98 - 110 mmol/L 106 108 106  CO2 20 - 31 mmol/L _2 Calcium 8.6 - 10.3 mg/dL 9.4 9.3 8.8    Hepatic Function Latest Ref Rng & Units 04/08/2016 11/27/2015 06/05/2015  Total Protein 6.1 - 8.1 g/dL 6.7 6.5 6.7  Albumin 3.6 - 5.1 g/dL 4.3 4.1 4.5  AST 10 - 35 U/L _3 ALT 9 - 46 U/L 14 15(L) 18  Alk Phosphatase 40 - 115 U/L 58 53 51  Total Bilirubin 0.2 - 1.2 mg/dL 0.4 0.3 0.4    CBC Latest Ref Rng & Units 04/08/2016 11/27/2015 06/05/2015  WBC 3.8 - 10.8 K/uL 6.2 6.6 7.8  Hemoglobin 13.0 - 17.0 g/dL 11.3(L) 10.7(L) 11.3(L)  Hematocrit 38.5 - 50.0 % 34.5(L) 32.0(L) 34.4(L)  Platelets 140 - 400 K/uL 253 252 234    Lab Results  Component Value Date   MCV 83.9 04/08/2016   MCV 79.6 11/27/2015   MCV 82.9 06/05/2015    Lab Results  Component Value Date   TSH 1.82 04/08/2016   Lab Results  Component Value Date   HGBA1C 6.2 04/27/2016   Lipid Panel     Component Value Date/Time   CHOL 120 04/08/2016 0828   TRIG 94 04/08/2016 0828   HDL 67 04/08/2016 0828   CHOLHDL 1.8 04/08/2016 0828   VLDL 19 04/08/2016 0828   LDLCALC 34 04/08/2016 0828    RADIOLOGY: No results found.  IMPRESSION: 1. OSA on CPAP   2. 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 Essential hypertension   4. Obesity (BMI 30-39.9); 32.6 on 10/02/2012   5. Type 2  diabetes mellitus with other circulatory complications ([place])  ASSESSMENT AND PLAN:  James Jones is a 38 -year-old African-American male who has a history of obstructive sleep apnea for over 20 years.  He never used CPAP until 2014 and commenced after his evaluation at [facility], which revealed severe obstructive sleep apnea with an AHI of 69.3/h and during REM sleep 71.5 per hour; he had loud snoring and significant oxygen desaturation.  His CPAP machine is a Adult nurse.  He is followed by [employer] as his DME company.  I reviewed his most recent download with him in detail.  He is AHI is excellent.  He is requiring a high pressure and is set at 18 cm.  Although he is meeting compliance standards, I have strongly recommended that he increase his sleep duration since he is only sleeping 6 hours and 1 minute with CPAP therapy.  I again had long discussion with him regarding the importance of optimal sleep and the adverse consequences if his sleep apnea is under treated with reference to cardiovascular risk.  He is mildly obese and weight loss was recommended.  I encouraged him to continue his exercise since there is data concerning that exercise can attenuate sleep apnea.  His blood pressure today is controlled on valsartan HCT 320/25.  He has type 2 diabetes mellitus on metformin.  From a sleep perspective.  I will see him in one-year.  He will return to Dr. Ellyn Hack for follow-up Cardiologic evaluation. Time spent: 25 minutes  Troy Sine, MD, [facility]  09/15/2016 6:15 PM

## 2016-09-14 DIAGNOSIS — R109 Unspecified abdominal pain: Secondary | ICD-10-CM | POA: Diagnosis not present

## 2016-09-28 DIAGNOSIS — H0015 Chalazion left lower eyelid: Secondary | ICD-10-CM | POA: Diagnosis not present

## 2016-10-21 ENCOUNTER — Ambulatory Visit (INDEPENDENT_AMBULATORY_CARE_PROVIDER_SITE_OTHER): Payer: Medicare Other | Admitting: Internal Medicine

## 2016-10-21 ENCOUNTER — Other Ambulatory Visit: Payer: Self-pay | Admitting: Internal Medicine

## 2016-10-21 ENCOUNTER — Encounter: Payer: Self-pay | Admitting: Internal Medicine

## 2016-10-21 VITALS — BP 128/72 | HR 64 | Ht 67.0 in | Wt 196.0 lb

## 2016-10-21 DIAGNOSIS — E784 Other hyperlipidemia: Secondary | ICD-10-CM

## 2016-10-21 DIAGNOSIS — I2510 Atherosclerotic heart disease of native coronary artery without angina pectoris: Secondary | ICD-10-CM | POA: Diagnosis not present

## 2016-10-21 DIAGNOSIS — E1159 Type 2 diabetes mellitus with other circulatory complications: Secondary | ICD-10-CM | POA: Diagnosis not present

## 2016-10-21 DIAGNOSIS — E7849 Other hyperlipidemia: Secondary | ICD-10-CM

## 2016-10-21 LAB — POCT GLYCOSYLATED HEMOGLOBIN (HGB A1C): HEMOGLOBIN A1C: 6.2

## 2016-10-21 NOTE — Patient Instructions (Addendum)
Please continue: - Metformin ER 500 mg with b'fast.  Please return in 4 months with your sugar log.   Your HbA1c is great, at 6.2%!

## 2016-10-21 NOTE — Progress Notes (Signed)
Patient ID: James Jones, male   DOB: 01-06-1946, 71 y.o.   MRN: [id number]  HPI: James Jones is a 71 y.o.-year-old male, returning for f/u for DM2, dx in ~2005, non-insulin-dependent, lately more controlled , with complications (heart ds., mild CKD). Last visit 6 mo ago. PCP: Dr. Lynnea FerrierWarren Pickard  He had a gout flare since last visit.   Last hemoglobin A1c was: Lab Results  Component Value Date   HGBA1C 6.2 04/27/2016   HGBA1C 6.4 01/28/2016   HGBA1C 6.2 10/29/2015  04/11/2014: HbA1c 8.4% 01/08/2014: HbA1c 7.7% He had steroid inj for gout in his elbows - 12/2013. He also had a steroid inj in knee fall 2016, too.   Pt is on a regimen of: - Metformin ER 500 mg 1x a day in am We stopped Januvia at last visit. We stopped Glipizide ER 2.5 mg daily in 07/2015. Had nausea, vomiting, loss of appetite with regular metformin.  He was on Actos. He also tried Metformin in the past >> unclear why.  He was on insulin before (Lantus) - came off years ago.  Pt checks his sugars 1-3x a day: - am: 84-104 >> 89-117 >> 95-116 >> 72-110 - 2h after b'fast:  147 >> n/c >> 76 >> n/c - before lunch:  102-105, 190 (pie) >> 72-106 >> 80's-111 - 2h after lunch:  115, 118 >> 116, 122 >> 61 (did no eat)-120 - before dinner: 71-107 >> 79, 82 >> 90-110 - 2h after dinner:  156 >> 70-123 - bedtime: 81, 98 >> 123, 146 (coconut cake) >> 89-135 - nighttime:105 >> 100 >> 89 >> 112 Lowest 70 >> 61; he has hypoglycemia awareness at 70.  Highest sugar was 146 >> 135.  Glucometer: Freestyle [employer] did not cover Freestyle Libre CGM.  Pt's meals are: - Breakfast: bacon + eggs, sausage, grits, oatmeal, cereal, toast wheat - Lunch: BLT, soups, fruit, nuts - Dinner: chicken, pork + greens, rice  - Snacks: 1 a day: pretzels; carrots He is still exercising 3x a week ([facility]  - he has CKD, last BUN/creatinine:  Lab Results  Component Value Date   BUN 25 04/08/2016   CREATININE 1.79 (H)  04/08/2016  05/24/2014: 34/1.64 01/08/2014: 18/1.15 On Valsartan. - last set of lipids: Lab Results  Component Value Date   CHOL 120 04/08/2016   HDL 67 04/08/2016   LDLCALC 34 04/08/2016   TRIG 94 04/08/2016   CHOLHDL 1.8 04/08/2016  04/11/2014: 206/143/70/106 01/08/2014: 161/98/60/73 On Crestor 40. - last eye exam was in 02/2016 >> No DR. Had cataracts removed 01/09/2015 >> implant in R eye. He had the other cataract removed 02/2015. - denies numbness and tingling in his feet.  Last TSH  Lab Results  Component Value Date   TSH 1.82 04/08/2016   ROS: Constitutional: no weight gain/no weight loss, no fatigue, no subjective hyperthermia, no subjective hypothermia Eyes: no blurry vision, no xerophthalmia ENT: no sore throat, no nodules palpated in throat, no dysphagia, no odynophagia, no hoarseness Cardiovascular: no CP/no SOB/no palpitations/no leg swelling Respiratory: no cough/no SOB/no wheezing Gastrointestinal: no N/no V/no D/no C/no acid reflux Musculoskeletal: no muscle aches/no joint aches Skin: no rashes, no hair loss Neurological: no tremors/no numbness/no tingling/no dizziness  I reviewed pt's medications, allergies, PMH, social hx, family hx, and changes were documented in the history of present illness. Otherwise, unchanged from my initial visit note.   Past Medical History:  Diagnosis Date  . 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  CAD (coronary artery disease), with 60-70% stenosis in RCA 03/18/2012   FFR 0.84; EF 60-65%  . 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 Diabetes mellitus without complication ([facility])   . History of GI bleed January 2015   No obvious findings on EGD/colonoscopy  . Hyperlipidemia   . Hypertension   . OSA (obstructive sleep apnea) 03/18/2012   Doing better with CPAP  . Pulmonary hypertension ([facility]) 12/10/2010   ECHO:  Mild PH,mild LVH; PA pressures estimated 30-40 mmHg  . RBBB, 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   . Wenckebach 04/09/2012-05/09/2012   Event monitor; usually during sleeping hours; therefore not on beta blocker  + Gout  Past Surgical History:  Procedure Laterality Date  . APPENDECTOMY  1974  . CARDIAC CATHETERIZATION  03/27/2010   Moderate mid RCA lesion,right radial approach,normal EF  . CARPAL TUNNEL RELEASE    . EYE SURGERY  1960   Left eye  . HEMORRHOID SURGERY    . LEFT HEART CATHETERIZATION WITH CORONARY ANGIOGRAM N/A 03/17/2012   Procedure: LEFT HEART CATHETERIZATION WITH CORONARY ANGIOGRAM;  Surgeon: Marykay Lex, MD;  Location: [facility] CATH LAB;  Service: Cardiovascular;  Laterality: N/A;  . SHOULDER ARTHROSCOPY  2006   History   Social History  . Marital Status: Married    Spouse Name: N/A   Occupational History  . retired   Social History Main Topics  . Smoking status: Former Smoker -- 2.00 packs/day    Quit date: 04/12/2012  . Smokeless tobacco: Never Used  . Alcohol Use: 0.5 oz/week    1 drink(s) per week  . Drug Use: Yes    Special: Marijuana     Comment: cannibus   Social History Narrative   Married, father of 2, grandfather of 3.   He is a former smoker of about a pack to pack and half cigarettes a day -- he quit in March of this year.    He is an avid exerciser working at least 4-5 days a week doing her walking or stationary bike.   Current Outpatient Prescriptions on File Prior to Visit  Medication Sig Dispense Refill  . aspirin EC 81 MG tablet Take 81 mg by mouth every other day.    . Cholecalciferol (VITAMIN D-3) 1000 UNITS CAPS Take by mouth daily. 2 capsules.    . COLCRYS 0.6 MG tablet Take 1 tablet (0.6 mg total) by mouth 2 (two) times daily as needed. 30 tablet 0  . Continuous Blood Gluc Receiver (FREESTYLE LIBRE READER) DEVI 1 Device by Does not apply route 3 (three) times daily. Dx- E11.9 1 Device 1  . Continuous Blood Gluc Sensor (FREESTYLE LIBRE SENSOR SYSTEM) MISC 1 Device by Does not apply route every 30 (thirty) days. 3 each 11  . ferrous  sulfate 325 (65 FE) MG tablet Take 1 tablet (325 mg total) by mouth 2 (two) times daily with a meal.  3  . fish oil-omega-3 fatty acids 1000 MG capsule Take 1 g by mouth daily.    Marland Kitchen glucose blood (FREESTYLE PRECISION NEO TEST) test strip Use 1x a day - to use with the [facility] receiver/glucometer 50 each 11  . glucose blood (FREESTYLE TEST STRIPS) test strip Use to test blood sugar 2 times daily as instructed. Dx: E11.59 200 each 3  . metFORMIN (GLUCOPHAGE-XR) 500 MG 24 hr tablet TAKE 1 TABLET BY MOUTH WITH DINNER 180 tablet 3  . Multiple Vitamin (MULTIVITAMIN WITH MINERALS) TABS Take 1 tablet by mouth daily.    Marland Kitchen  pantoprazole (PROTONIX) 40 MG tablet Take 1 tablet by mouth daily.    . Potassium 99 MG TABS Take 1 tablet by mouth daily.    Marland Kitchen PRESCRIPTION MEDICATION Uses C-PAP at bedtime    . rosuvastatin (CRESTOR) 40 MG tablet Take 1 tablet (40 mg total) by mouth daily. 90 tablet 2   No current facility-administered medications on file prior to visit.    No Known Allergies Family History  Problem Relation Age of Onset  . Kidney failure Mother   . Coronary artery disease Father   . Kidney failure Sister    PE: BP 128/72 (BP Location: Left Arm, Patient Position: Sitting)   Pulse 64   Ht  (1.702 m)   Wt 196 lb (88.9 kg)   SpO2 98%   BMI 30.70 kg/m   Body mass index is 30.7 kg/m. Wt Readings from Last 3 Encounters:  10/21/16 196 lb (88.9 kg)  09/13/16 200 lb 3.2 oz (90.8 kg)  07/27/16 202 lb (91.6 kg)   Constitutional: overweight, in NAD Eyes: PERRLA, EOMI, no exophthalmos ENT: moist mucous membranes, no thyromegaly, + small isthmic nodule palpated, no cervical lymphadenopathy Cardiovascular: RRR, No MRG Respiratory: CTA B Gastrointestinal: abdomen soft, NT, ND, BS+ Musculoskeletal: no deformities, strength intact in all 4 Skin: moist, warm, no rashes Neurological: no tremor with outstretched hands, DTR normal in all 4  ASSESSMENT: 1. DM2, non-insulin-dependent,  uncontrolled, with complications - heart ds - ? - cardiologist Dr. Herbie Baltimore - mild CKD   2. HL  PLAN:  1. Patient with long-standing, uncontrolled diabetes, on oral antidiabetic regimen, with much improved control after adding Metformin XR so that we could stop Glipizide ER summer 2017. As his GFR was worse, PCP recently stopped Metformin and started Januvia.  We discussed that Metformin use has now been approved by FDA based on GFR, rather than creatinine - no restrictions for GFR >45, an decrease in dose needed for lower GFR levels. Therefore, we can restart a low dose Metformin, but will have him finish his Januvia. This is expensive for him. I advised him to take only 50 mg daily (as Januvia dose needs to be kidney-dose adjusted) and start 1 tab of Metformin when he finishes Januvia. -  I suggested to:  Patient Instructions  Please continue: - Metformin ER 500 mg with b'fast.  Please return in 4 months with your sugar log.   Your HbA1c is great, at 6.2%!  - today, HbA1c is 6.2% (stable, great!) - continue checking sugars at different times of the day - check 1-2x a day, rotating checks - advised for yearly eye exams >> he is UTD - Return to clinic in 4 mo with sugar log   2. HL - excellent lipids at last check - continue high dose generic rosuvastatin - refuses a check today as he has a coming up appt with PCP - continue Crestor  Carlus Pavlov, MD PhD [facility] Endocrinology

## 2016-12-08 ENCOUNTER — Other Ambulatory Visit: Payer: Self-pay | Admitting: Family Medicine

## 2016-12-08 MED ORDER — COLCRYS 0.6 MG PO TABS: 0.6000 mg | ORAL_TABLET | Freq: Two times a day (BID) | ORAL | 0 refills | Status: DC | PRN

## 2016-12-10 ENCOUNTER — Ambulatory Visit (INDEPENDENT_AMBULATORY_CARE_PROVIDER_SITE_OTHER): Payer: Medicare Other | Admitting: Family Medicine

## 2016-12-10 ENCOUNTER — Encounter: Payer: Self-pay | Admitting: Family Medicine

## 2016-12-10 VITALS — BP 150/78 | HR 56 | Temp 97.8°F | Resp 16 | Ht 68.0 in | Wt 196.0 lb

## 2016-12-10 DIAGNOSIS — I10 Essential (primary) hypertension: Secondary | ICD-10-CM | POA: Diagnosis not present

## 2016-12-10 DIAGNOSIS — E119 Type 2 diabetes mellitus without complications: Secondary | ICD-10-CM | POA: Diagnosis not present

## 2016-12-10 DIAGNOSIS — I2510 Atherosclerotic heart disease of native coronary artery without angina pectoris: Secondary | ICD-10-CM

## 2016-12-10 DIAGNOSIS — M109 Gout, unspecified: Secondary | ICD-10-CM | POA: Diagnosis not present

## 2016-12-10 NOTE — Progress Notes (Signed)
Subjective:    Patient ID: James Jones, male    DOB: 09-28-1945, 71 y.o.   MRN: [id number]  HPI Patient presents with 1 week of severe pain in his right knee.  The knee is red hot erythematous and swollen.  It hurts simply to touch the skin.  He has a history of gout and frequently gets gout exacerbations.  He has been taking colchicine throughout the week with no benefit.  He is here today to discuss other options for ablation.  Patient has a history of diabetes mellitus type 2.  His most recent hemoglobin A1c was September.  He is overdue for a urine microalbumin.  He also has a history of ASCVD and is overdue to recheck a fasting lipid panel to ensure adequate management of his LDL cholesterol.  His blood pressure today is elevated but he is in severe pain. Past Medical History:  Diagnosis Date  . 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 CAD (coronary artery disease), with 60-70% stenosis in RCA 03/18/2012   FFR 0.84; EF 60-65%  . 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 Diabetes mellitus without complication (HCC)   . History of GI bleed January 2015   No obvious findings on EGD/colonoscopy  . Hyperlipidemia   . Hypertension   . OSA (obstructive sleep apnea) 03/18/2012   Doing better with CPAP  . Pulmonary hypertension (HCC) 12/10/2010   ECHO:  Mild PH,mild LVH; PA pressures estimated 30-40 mmHg  . 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 APPENDECTOMY  1974  . CARDIAC CATHETERIZATION  03/27/2010   Moderate mid RCA lesion,right radial approach,normal EF  . CARPAL TUNNEL RELEASE    . EYE SURGERY  1960   Left eye  . HEMORRHOID SURGERY    . LEFT HEART CATHETERIZATION WITH CORONARY ANGIOGRAM N/A 03/17/2012   Procedure: LEFT HEART CATHETERIZATION WITH CORONARY ANGIOGRAM;  Surgeon: Marykay Lexavid W Harding, MD;   [facility];  Service: Cardiovascular;  Laterality: N/A;  . SHOULDER ARTHROSCOPY  2006   Current Outpatient Prescriptions on File Prior to Visit  Medication Sig Dispense Refill  . aspirin EC 81 MG tablet Take 81 mg by mouth every other day.    . Cholecalciferol (VITAMIN D-3) 1000 UNITS CAPS Take by mouth daily. 2 capsules.    . COLCRYS 0.6 MG tablet Take 1 tablet (0.6 mg total) by mouth 2 (two) times daily as needed. 30 tablet 0  . ferrous sulfate 325 (65 FE) MG tablet Take 1 tablet (325 mg total) by mouth 2 (two) times daily with a meal.  3  . fish oil-omega-3 fatty acids 1000 MG capsule Take 1 g by mouth daily.    Marland Kitchen. glucose blood (FREESTYLE PRECISION NEO TEST) test strip Use 1x a day - to use with the [facility] receiver/glucometer 50 each 11  . glucose blood (FREESTYLE TEST STRIPS) test strip Use to test blood sugar 2 times daily as instructed. Dx: E11.59 200 each 3  . metFORMIN (GLUCOPHAGE-XR) 500 MG 24 hr tablet TAKE 1 TABLET BY MOUTH WITH DINNER 180 tablet 3  . Multiple Vitamin (MULTIVITAMIN WITH MINERALS) TABS Take 1 tablet by mouth daily.    . pantoprazole (PROTONIX) 40 MG tablet Take 1 tablet by mouth daily.    . Potassium 99 MG TABS Take 1 tablet by mouth daily.    .Marland Kitchen  PRESCRIPTION MEDICATION Uses C-PAP at bedtime    . rosuvastatin (CRESTOR) 40 MG tablet Take 1 tablet (40 mg total) by mouth daily. 90 tablet 2  . valsartan-hydrochlorothiazide (DIOVAN-HCT) 320-25 MG tablet TAKE 1 TABLET EVERY DAY 90 tablet 3   No current facility-administered medications on file prior to visit.    No Known Allergies Social History   Social History  . Marital status: Married    Spouse name: N/A  . Number of children: N/A  . Years of education: N/A   Occupational History  . Not on file.   Social History Main Topics  . Smoking status: Former Smoker    Packs/day: 1.00    Types: Cigarettes    Quit date: 04/12/2012  . Smokeless tobacco: Never Used  . Alcohol use 0.6 oz/week    1  Standard drinks or equivalent per week  . Drug use: Yes    Types: Marijuana     Comment: cannibus  . Sexual activity: Not on file   Other Topics Concern  . Not on file   Social History Narrative   Married, father of 2, grandfather of 3.   He is a former smoker of about a pack to pack and half cigarettes a day -- he quit in March of this year.    He is an avid exerciser working at least 4-5 days a week doing her walking or stationary bike.      Review of Systems  All other systems reviewed and are negative.      Objective:   Physical Exam  Cardiovascular: Normal rate, regular rhythm and normal heart sounds.   No murmur heard. Pulmonary/Chest: Effort normal and breath sounds normal. No respiratory distress. He has no wheezes. He has no rales.  Abdominal: Soft. Bowel sounds are normal. He exhibits no distension. There is no tenderness.  Musculoskeletal:       Right knee: He exhibits decreased range of motion, swelling, effusion and erythema. Tenderness found. Medial joint line and lateral joint line tenderness noted.  Vitals reviewed.         Assessment & Plan:  Type 2 diabetes mellitus without complication, without long-term current use of insulin (HCC) - Plan: CBC with Differential/Platelet, COMPLETE METABOLIC PANEL WITH GFR, Lipid panel, Microalbumin, urine, CANCELED: Hemoglobin A1c  ASCVD (arteriosclerotic cardiovascular disease)  Benign essential HTN  Exacerbation of gout  I believe the patient is having a gout exacerbation in his right knee.  I do not believe this is septic arthritis.  We discussed trying oral prednisone versus a cortisone injection in the knee.  Given the pain, the patient request that we proceed with a cortisone injection in his knee.  Using sterile technique, I injected the right knee with a mixture of 2 cc of lidocaine, 2 cc of Marcaine, and 2 cc of 40 mg/mL Kenalog.  The patient tolerated the procedure well.  I recommended that he check back with  [place] in 2 weeks once the gout exacerbation has completely healed.  At that time I would recommend starting him on allopurinol as a preventative medication given the frequency of his gout exacerbations.  His diabetes is under good control according to his most recent A1c.  I will check a urine microalbumin.  Given his history of ASCVD, I will also check a fasting lipid panel to ensure that his LDL cholesterol is below 70

## 2016-12-11 LAB — COMPLETE METABOLIC PANEL WITH GFR
AG Ratio: 1.8 (calc) (ref 1.0–2.5)
ALKALINE PHOSPHATASE (APISO): 70 U/L (ref 40–115)
ALT: 14 U/L (ref 9–46)
AST: 23 U/L (ref 10–35)
Albumin: 4.4 g/dL (ref 3.6–5.1)
BUN/Creatinine Ratio: 13 (calc) (ref 6–22)
BUN: 20 mg/dL (ref 7–25)
CHLORIDE: 105 mmol/L (ref 98–110)
CO2: 21 mmol/L (ref 20–32)
CREATININE: 1.52 mg/dL — AB (ref 0.70–1.18)
Calcium: 9.5 mg/dL (ref 8.6–10.3)
GFR, Est African American: 52 mL/min/{1.73_m2} — ABNORMAL LOW (ref >=60)
GFR, Est Non African American: 45 mL/min/{1.73_m2} — ABNORMAL LOW (ref >=60)
GLOBULIN: 2.4 g/dL (ref 1.9–3.7)
GLUCOSE: 80 mg/dL (ref 65–99)
Potassium: 4.2 mmol/L (ref 3.5–5.3)
SODIUM: 138 mmol/L (ref 135–146)
Total Bilirubin: 0.4 mg/dL (ref 0.2–1.2)
Total Protein: 6.8 g/dL (ref 6.1–8.1)

## 2016-12-11 LAB — MICROALBUMIN, URINE: Microalb, Ur: 21.2 mg/dL

## 2016-12-11 LAB — CBC WITH DIFFERENTIAL/PLATELET
BASOS ABS: 41 {cells}/uL (ref 0–200)
Basophils Relative: 0.6 %
EOS PCT: 1 %
Eosinophils Absolute: 69 cells/uL (ref 15–500)
HCT: 34.9 % — ABNORMAL LOW (ref 38.5–50.0)
Hemoglobin: 11.7 g/dL — ABNORMAL LOW (ref 13.2–17.1)
Lymphs Abs: 1387 cells/uL (ref 850–3900)
MCH: 27.9 pg (ref 27.0–33.0)
MCHC: 33.5 g/dL (ref 32.0–36.0)
MCV: 83.1 fL (ref 80.0–100.0)
MONOS PCT: 8.8 %
MPV: 9.4 fL (ref 7.5–12.5)
NEUTROS PCT: 69.5 %
Neutro Abs: 4796 cells/uL (ref 1500–7800)
Platelets: 252 10*3/uL (ref 140–400)
RBC: 4.2 10*6/uL (ref 4.20–5.80)
RDW: 13 % (ref 11.0–15.0)
Total Lymphocyte: 20.1 %
WBC mixed population: 607 cells/uL (ref 200–950)
WBC: 6.9 10*3/uL (ref 3.8–10.8)

## 2016-12-11 LAB — LIPID PANEL
CHOL/HDL RATIO: 2 (calc) (ref <5.0)
CHOLESTEROL: 137 mg/dL (ref <200)
HDL: 70 mg/dL (ref >40)
LDL Cholesterol (Calc): 49 mg/dL (calc)
Non-HDL Cholesterol (Calc): 67 mg/dL (calc) (ref <130)
Triglycerides: 101 mg/dL (ref <150)

## 2017-02-10 ENCOUNTER — Encounter: Payer: Self-pay | Admitting: Family Medicine

## 2017-02-15 ENCOUNTER — Ambulatory Visit (INDEPENDENT_AMBULATORY_CARE_PROVIDER_SITE_OTHER): Payer: Medicare Other | Admitting: Internal Medicine

## 2017-02-15 ENCOUNTER — Encounter: Payer: Self-pay | Admitting: Internal Medicine

## 2017-02-15 VITALS — BP 130/88 | HR 55 | Ht 68.0 in | Wt 190.6 lb

## 2017-02-15 DIAGNOSIS — E785 Hyperlipidemia, unspecified: Secondary | ICD-10-CM

## 2017-02-15 DIAGNOSIS — E663 Overweight: Secondary | ICD-10-CM

## 2017-02-15 DIAGNOSIS — E1159 Type 2 diabetes mellitus with other circulatory complications: Secondary | ICD-10-CM

## 2017-02-15 DIAGNOSIS — N189 Chronic kidney disease, unspecified: Secondary | ICD-10-CM | POA: Diagnosis not present

## 2017-02-15 DIAGNOSIS — E1122 Type 2 diabetes mellitus with diabetic chronic kidney disease: Secondary | ICD-10-CM

## 2017-02-15 LAB — POCT GLYCOSYLATED HEMOGLOBIN (HGB A1C): Hemoglobin A1C: 6.2

## 2017-02-15 NOTE — Addendum Note (Signed)
Addended by: Yolande JollyLAWSON, Jasher Barkan on: 02/15/2017 11:38 AM   Modules accepted: Orders

## 2017-02-15 NOTE — Progress Notes (Signed)
Patient ID: James Jones, male   DOB: September 26, 1945, 72 y.o.   MRN: [id number]  HPI: James Jones is a 72 y.o.-year-old male, returning for f/u for DM2, dx in ~2005, non-insulin-dependent, lately more controlled , with complications (heart ds., mild CKD). Last visit 4 months ago. PCP: Dr. Lynnea Ferrier  He had a gout flare since last visit.   Last hemoglobin A1c was: Lab Results  Component Value Date   HGBA1C 6.2 10/21/2016   HGBA1C 6.2 04/27/2016   HGBA1C 6.4 01/28/2016  04/11/2014: HbA1c 8.4% 01/08/2014: HbA1c 7.7% He had steroid inj for gout in his elbows - 12/2013. He also had a steroid inj in knee fall 2016, too.   Pt is on a regimen of: - Metformin ER 500 mg once a day in a.m. We stopped Januvia after last visit, when he ran out. We stopped Glipizide ER 2.5 mg daily in 07/2015. Had nausea, vomiting, loss of appetite with regular metformin.  He was on Actos. He also tried Metformin in the past >> unclear why.  He was on insulin before (Lantus) - came off years ago.  Pt checks his sugars 1-3x a day: - am: 84-104 >> 89-117 >> 95-116 >> 72-110 >> 81-106 - 2h after b'fast:  147 >> n/c >> 76 >> n/c >> 68, 71-156, 161 - before lunch:  102-105, 190 (pie) >> 72-106 >> 80's-111 >> 54 x1, 80, 133 - 2h after lunch:  115, 118 >> 116, 122 >> 61 (did no eat)-120 >> 107, 118 - before dinner: 71-107 >> 79, 82 >> 90-110 >> 71-135 - 2h after dinner:  156 >> 70-123 >> 84-147 - bedtime: 81, 98 >> 123, 146 (coconut cake) >> 89-135 >> n/c - nighttime:105 >> 100 >> 89 >> 112 >> n/c Lowest 70 >> 61 >> 54 x1; he has hypoglycemia awareness in the 70s. Highest sugar was 146 >> 135 >> 161.  Glucometer: Freestyle [employer] did not cover Freestyle Libre CGM.  Pt's meals are: - Breakfast: bacon + eggs, sausage, grits, oatmeal, cereal, toast wheat - Lunch: BLT, soups, fruit, nuts - Dinner: chicken, pork + greens, rice  - Snacks: 1 a day: pretzels; carrots He is still exercising 3x a week (walking  4  miles at the [facility]).  -+ CKD, last BUN/creatinine:  Lab Results  Component Value Date   BUN 20 12/10/2016   CREATININE 1.52 (H) 12/10/2016  05/24/2014: 34/1.64 01/08/2014: 18/1.15 . On valsartan. -+ HL; last set of lipids: Lab Results  Component Value Date   CHOL 137 12/10/2016   HDL 70 12/10/2016   LDLCALC 34 04/08/2016   TRIG 101 12/10/2016   CHOLHDL 2.0 12/10/2016  04/11/2014: 206/143/70/106 01/08/2014: 161/98/60/73 On Crestor 40. - last eye exam was in 02/2016: No DR. Had cataracts removed 01/09/2015 >> implant in R eye. He had the other cataract removed 02/2015. - Denies numbness and tingling in his feet.  Last TSH was normal: Lab Results  Component Value Date   TSH 1.82 04/08/2016   ROS: Constitutional: + weight loss, no fatigue, no subjective hyperthermia, no subjective hypothermia Eyes: no blurry vision, no xerophthalmia ENT: no sore throat, no nodules palpated in throat, no dysphagia, no odynophagia, no hoarseness Cardiovascular: no CP/no SOB/no palpitations/no leg swelling Respiratory: no cough/no SOB/no wheezing Gastrointestinal: no N/no V/no D/no C/no acid reflux Musculoskeletal: no muscle aches/no joint aches Skin: no rashes, no hair loss, + itching - hands - relieved by Zyrtec Neurological: no tremors/no numbness/no tingling/no dizziness  I reviewed pt's medications, allergies, PMH,  social hx, family hx, and changes were documented in the history of present illness. Otherwise, unchanged from my initial visit note.   Past Medical History:  Diagnosis Date  . 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 CAD (coronary artery disease), with 60-70% stenosis in RCA 03/18/2012   FFR 0.84; EF 60-65%  . 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 Diabetes mellitus without complication (HCC)   . History of GI bleed January 2015   No obvious findings on EGD/colonoscopy  . Hyperlipidemia   . Hypertension   . OSA (obstructive sleep apnea)  03/18/2012   Doing better with CPAP  . Pulmonary hypertension (HCC) 12/10/2010   ECHO:  Mild PH,mild LVH; PA pressures estimated 30-40 mmHg  . 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 Wenckebach 04/09/2012-05/09/2012   Event monitor; usually during sleeping hours; therefore not on beta blocker  + Gout  Past Surgical History:  Procedure Laterality Date  . APPENDECTOMY  1974  . CARDIAC CATHETERIZATION  03/27/2010   Moderate mid RCA lesion,right radial approach,normal EF  . CARPAL TUNNEL RELEASE    . EYE SURGERY  1960   Left eye  . HEMORRHOID SURGERY    . LEFT HEART CATHETERIZATION WITH CORONARY ANGIOGRAM N/A 03/17/2012   Procedure: LEFT HEART CATHETERIZATION WITH CORONARY ANGIOGRAM;  Surgeon: Marykay Lex, MD;  Location[facility] CATH LAB;  Service: Cardiovascular;  Laterality: N/A;  . SHOULDER ARTHROSCOPY  2006   History   Social History  . Marital Status: Married    Spouse Name: N/A   Occupational History  . retired   Social History Main Topics  . Smoking status: Former Smoker -- 2.00 packs/day    Quit date: 04/12/2012  . Smokeless tobacco: Never Used  . Alcohol Use: 0.5 oz/week    1 drink(s) per week  . Drug Use: Yes    Special: Marijuana     Comment: cannibus   Social History Narrative   Married, father of 2, grandfather of 3.   He is a former smoker of about a pack to pack and half cigarettes a day -- he quit in March of this year.    He is an avid exerciser working at least 4-5 days a week doing her walking or stationary bike.   Current Outpatient Medications on File Prior to Visit  Medication Sig Dispense Refill  . aspirin EC 81 MG tablet Take 81 mg by mouth every other day.    . Cholecalciferol (VITAMIN D-3) 1000 UNITS CAPS Take by mouth daily. 2 capsules.    . COLCRYS 0.6 MG tablet Take 1 tablet (0.6 mg total) by mouth 2 (two) times daily as needed. 30 tablet 0  . ferrous sulfate 325 (65 FE) MG tablet Take 1 tablet (325 mg total) by mouth 2 (two) times daily with a meal.  3  .  fish oil-omega-3 fatty acids 1000 MG capsule Take 1 g by mouth daily.    Marland Kitchen glucose blood (FREESTYLE PRECISION NEO TEST) test strip Use 1x a day - to use with the [facility] receiver/glucometer 50 each 11  . glucose blood (FREESTYLE TEST STRIPS) test strip Use to test blood sugar 2 times daily as instructed. Dx: E11.59 200 each 3  . metFORMIN (GLUCOPHAGE-XR) 500 MG 24 hr tablet TAKE 1 TABLET BY MOUTH WITH DINNER 180 tablet 3  . Multiple Vitamin (MULTIVITAMIN WITH MINERALS) TABS Take 1 tablet by mouth daily.    . pantoprazole (PROTONIX) 40 MG tablet Take 1 tablet  by mouth daily.    . Potassium 99 MG TABS Take 1 tablet by mouth daily.    Marland Kitchen. PRESCRIPTION MEDICATION Uses C-PAP at bedtime    . rosuvastatin (CRESTOR) 40 MG tablet Take 1 tablet (40 mg total) by mouth daily. 90 tablet 2  . valsartan-hydrochlorothiazide (DIOVAN-HCT) 320-25 MG tablet TAKE 1 TABLET EVERY DAY 90 tablet 3   No current facility-administered medications on file prior to visit.    No Known Allergies Family History  Problem Relation Age of Onset  . Kidney failure Mother   . Coronary artery disease Father   . Kidney failure Sister    PE: BP 130/88   Pulse (!) 55   Ht 5\' 8"  (1.727 m)   Wt 190 lb 9.6 oz (86.5 kg)   SpO2 97%   BMI 28.98 kg/m  Body mass index is 28.98 kg/m. Wt Readings from Last 3 Encounters:  02/15/17 190 lb 9.6 oz (86.5 kg)  12/10/16 196 lb (88.9 kg)  10/21/16 196 lb (88.9 kg)   Constitutional: overweight, in NAD Eyes: PERRLA, EOMI, no exophthalmos ENT: moist mucous membranes, no thyromegaly, + small isthmic nodule palpated, no cervical lymphadenopathy Cardiovascular: RRR, No MRG Respiratory: CTA B Gastrointestinal: abdomen soft, NT, ND, BS+ Musculoskeletal: no deformities, strength intact in all 4 Skin: moist, warm, no rashes Neurological: no tremor with outstretched hands, DTR normal in all 4  ASSESSMENT: 1. DM2, non-insulin-dependent, uncontrolled, with complications - heart ds - ? -  cardiologist Dr. Herbie BaltimoreHarding - mild CKD   2. HL  3. Overweight  PLAN:  1. Patient with long-standing, previously uncontrolled, now more controlled diabetes, on oral antidiabetic regimen with minimal metformin ER dose.  We could stop glipizide ER in the summer 2017.  At last visit, PCP switched him from metformin to Januvia due to a low GFR but we did discuss that metformin is now approved with no restrictions for patients with GFR>45.  We continue Januvia at 50 mg daily (dose adjusted for CKD), and I advised him to start at 1 tablet of metformin daily after he finished Januvia. - he is now on Metformin but c/o low appetite and lost weight >> had few lows >> will try to stop Metformin but I advised him to continue to check sugars and restart Metformin if they start increasing or if his appetite picks up -  I suggested to:  Patient Instructions  Please stop Metformin ER 500 mg with breakfast.  If sugars start to increase or you have more appetite, add Metformin back.  Please return in 4 months with your sugar log.   Your HbA1c is great, at 6.2%!  - today, HbA1c is 6.2% (stable) - continue checking sugars at different times of the day - check 1x a day, rotating checks - advised for yearly eye exams >> he is due - scheduled - Return to clinic in 4 mo with sugar log    2. HL - Reviewed lipids from 12/2016: Excellent - He continues on high-dose generic rosuvastatin without side effects.  3. Obesity - lost 6 lbs since last visit >> congratulated him - will stop Metformin as he c/o not having an appetite and also as his sugars are great  Carlus Pavlovristina Ivianna Notch, MD PhD [facility]

## 2017-02-15 NOTE — Patient Instructions (Addendum)
Please stop Metformin ER 500 mg with breakfast.  If sugars start to increase or you have more appetite, add Metformin back.  Please return in 4 months with your sugar log.   Your HbA1c is great, at 6.2%!

## 2017-02-28 DIAGNOSIS — H5022 Vertical strabismus, left eye: Secondary | ICD-10-CM | POA: Diagnosis not present

## 2017-02-28 DIAGNOSIS — Z961 Presence of intraocular lens: Secondary | ICD-10-CM | POA: Diagnosis not present

## 2017-02-28 DIAGNOSIS — E119 Type 2 diabetes mellitus without complications: Secondary | ICD-10-CM | POA: Diagnosis not present

## 2017-02-28 LAB — HM DIABETES EYE EXAM

## 2017-03-04 ENCOUNTER — Encounter: Payer: Self-pay | Admitting: Internal Medicine

## 2017-03-11 ENCOUNTER — Telehealth: Payer: Self-pay | Admitting: *Deleted

## 2017-03-11 NOTE — Telephone Encounter (Signed)
Supply order signed by Dr. Kelly and returned to [facility] 1/31 

## 2017-03-17 ENCOUNTER — Encounter: Payer: Self-pay | Admitting: Cardiology

## 2017-03-17 ENCOUNTER — Ambulatory Visit (INDEPENDENT_AMBULATORY_CARE_PROVIDER_SITE_OTHER): Payer: Medicare Other | Admitting: Cardiology

## 2017-03-17 VITALS — BP 128/62 | HR 57 | Ht 67.0 in | Wt 192.8 lb

## 2017-03-17 DIAGNOSIS — E663 Overweight: Secondary | ICD-10-CM | POA: Diagnosis not present

## 2017-03-17 DIAGNOSIS — I441 Atrioventricular block, second degree: Secondary | ICD-10-CM

## 2017-03-17 DIAGNOSIS — I2510 Atherosclerotic heart disease of native coronary artery without angina pectoris: Secondary | ICD-10-CM

## 2017-03-17 DIAGNOSIS — G4733 Obstructive sleep apnea (adult) (pediatric): Secondary | ICD-10-CM | POA: Diagnosis not present

## 2017-03-17 DIAGNOSIS — E785 Hyperlipidemia, unspecified: Secondary | ICD-10-CM | POA: Diagnosis not present

## 2017-03-17 DIAGNOSIS — R011 Cardiac murmur, unspecified: Secondary | ICD-10-CM | POA: Diagnosis not present

## 2017-03-17 DIAGNOSIS — I10 Essential (primary) hypertension: Secondary | ICD-10-CM | POA: Diagnosis not present

## 2017-03-17 NOTE — Assessment & Plan Note (Signed)
Followed by Dr. Tresa EndoKelly -seen in August 2018. Marland Kitchen.  Seems to be doing well with no recurrent issues.

## 2017-03-17 NOTE — Assessment & Plan Note (Signed)
Continues to be stable with no anginal symptoms.  Is very active with routine exercise.  He is on aspirin statin and ARB.  Not on beta-blocker because of bradycardia.

## 2017-03-17 NOTE — Assessment & Plan Note (Signed)
3381-month-old soft murmurs.  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  He may have some mild MR or TR that is heard.  But with no symptoms I would not follow-up with another echo.

## 2017-03-17 NOTE — Assessment & Plan Note (Signed)
No beta-blocker.  Asymptomatic.  On CPAP.

## 2017-03-17 NOTE — Progress Notes (Signed)
PCP: Donita BrooksPickard, Warren T, MD  Clinic Note: Chief Complaint  Patient presents with  . Follow-up  . Coronary Artery Disease    NON-OCCLUSIVE    HPI: James Jones is a 72 y.o. male with a PMH below who presents today for Annual follow-up for nodule to CAD with cardiac risk factors including type 2 diabetes mellitus, hypertension and hyperlipidemia. Also has a soft cardiac murmur.  He enjoys working out  at [employer] gym 3 days a week and walks 3 miles a day otherwise.   James Jones was last seen on March 22, 2016 -was doing well at that time.  Still exercising at the gym routinely and walking 4 days a week.  No symptoms.  Recent Hospitalizations: Back pain evaluation in the ER on October 19  Studies Reviewed: None  Interval History: James Jones returns today stating that he feels like he is in his 7120s again.  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  Besides some occasional gout related to her arthritis related pains, he is feeling wonderful.  His primary doctor is actually taken him off all of his diabetes medicine because his A1c is under control.  He has lost 5 pounds since his last visit.  He did he denies any active cardiac symptoms.  He just has occasional heartburn when he lies down at night but otherwise has been stable.  Cardiac Review of Symptoms: No chest tightness or pressure with rest or exertion.  No resting or exertional dyspnea. No PND, orthopnea or edema.  No palpitations, lightheadedness, dizziness, weakness or syncope/near syncope. No TIA/amaurosis fugax symptoms. No claudication.  ROS: A comprehensive was performed. Review of Systems  Constitutional: Negative for malaise/fatigue.  HENT: Negative for congestion and nosebleeds.   Respiratory: Negative for cough, sputum production and wheezing.   Gastrointestinal: Positive for heartburn (Better with H2 blocker, but does not completely take care of it.).  Negative for constipation.  Genitourinary: Negative for dysuria.  Musculoskeletal: Positive for joint pain. Negative for falls and myalgias.  Skin: Negative.   Neurological: Negative for dizziness and tremors.  Psychiatric/Behavioral: Negative for depression. The patient is not nervous/anxious and does not have insomnia.   All other systems reviewed and are negative.   Past Medical History:  Diagnosis Date  . 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 CAD (coronary artery disease), with 60-70% stenosis in RCA 03/18/2012   FFR 0.84; EF 60-65%  . 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 Diabetes mellitus without complication (HCC)   . History of GI bleed January 2015   No obvious findings on EGD/colonoscopy  . Hyperlipidemia   . Hypertension   . OSA (obstructive sleep apnea) 03/18/2012   Doing better with CPAP  . Pulmonary hypertension (HCC) 12/10/2010   ECHO:  Mild PH,mild LVH; PA pressures estimated 30-40 mmHg  . 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  EF> 55%.  GR 1 DD.  Mild aortic sclerosis no stenosis.  PA pressures estimated 30-40 mmHg   Past Surgical History:  Procedure Laterality Date  . APPENDECTOMY  1974  . CARDIAC CATHETERIZATION  03/27/2010   Moderate mid RCA lesion,right radial approach,normal EF  . CARPAL TUNNEL RELEASE    . EYE SURGERY  1960   Left  eye  . HEMORRHOID SURGERY    . LEFT HEART CATHETERIZATION WITH CORONARY ANGIOGRAM N/A 03/17/2012   Procedure: LEFT HEART CATHETERIZATION WITH CORONARY ANGIOGRAM;  Surgeon: Marykay Lex, MD;  Location: [facility] CATH LAB;  Service: Cardiovascular: Fractional Flow Reserve Measurement of mid RCA 60-70% stenosis = 0.84. Otherwise mild LCA CAD.  Normal EF & EDP  . SHOULDER ARTHROSCOPY  2006    Current Meds  Medication Sig  . aspirin EC 81 MG tablet Take  81 mg by mouth every other day.  . Cholecalciferol (VITAMIN D-3) 1000 UNITS CAPS Take by mouth daily. 2 capsules.  . COLCRYS 0.6 MG tablet Take 1 tablet (0.6 mg total) by mouth 2 (two) times daily as needed.  . ferrous sulfate 325 (65 FE) MG tablet Take 1 tablet (325 mg total) by mouth 2 (two) times daily with a meal.  . fish oil-omega-3 fatty acids 1000 MG capsule Take 1 g by mouth daily.  Marland Kitchen glucose blood (FREESTYLE PRECISION NEO TEST) test strip Use 1x a day - to use with the [employer] receiver/glucometer  . glucose blood (FREESTYLE TEST STRIPS) test strip Use to test blood sugar 2 times daily as instructed. Dx: E11.59  . metFORMIN (GLUCOPHAGE-XR) 500 MG 24 hr tablet TAKE 1 TABLET BY MOUTH WITH DINNER  . Multiple Vitamin (MULTIVITAMIN WITH MINERALS) TABS Take 1 tablet by mouth daily.  . pantoprazole (PROTONIX) 40 MG tablet Take 1 tablet by mouth daily.  . Potassium 99 MG TABS Take 1 tablet by mouth daily.  Marland Kitchen PRESCRIPTION MEDICATION Uses C-PAP at bedtime  . rosuvastatin (CRESTOR) 40 MG tablet Take 1 tablet (40 mg total) by mouth daily.  . valsartan-hydrochlorothiazide (DIOVAN-HCT) 320-25 MG tablet TAKE 1 TABLET EVERY DAY    No Known Allergies  Social History   Tobacco Use  . Smoking status: Former Smoker    Packs/day: 1.00    Types: Cigarettes    Last attempt to quit: 04/12/2012    Years since quitting: 4.9  . Smokeless tobacco: Never Used  Substance Use Topics  . Alcohol use: Yes    Alcohol/week: 0.6 oz    Types: 1 Standard drinks or equivalent per week  . Drug use: Yes    Types: Marijuana    Comment: cannibus   Social History   Social History Narrative   Married, father of 2, grandfather of 3.   He is a former smoker of about a pack to pack and half cigarettes a day -- he quit in March of this year.    He is an avid exerciser working at least 4-5 days a week doing her walking or stationary bike.    family history includes Coronary artery disease in his father; Kidney  failure in his mother and sister.  Wt Readings from Last 3 Encounters:  03/17/17 192 lb 12.8 oz (87.5 kg)  02/15/17 190 lb 9.6 oz (86.5 kg)  12/10/16 196 lb (88.9 kg)  03/2016: Wt 197 lb 3.2 oz (89.4 kg)   PHYSICAL EXAM BP 128/62   Pulse (!) 57   Ht 5\' 7"  (1.702 m)   Wt 192 lb 12.8 oz (87.5 kg)   BMI 30.20 kg/m   Physical Exam  Constitutional: He is oriented to person, place, and time. He appears well-developed and well-nourished. No distress.  HENT:  Head: Normocephalic and atraumatic.  Eyes:  Lazy eye  Neck: Normal range of motion. Neck supple. No hepatojugular reflux and no JVD present. Carotid bruit is not present.  Cardiovascular: Regular rhythm,  S1 normal and normal pulses.  No extrasystoles are present. Bradycardia present. PMI is not displaced. Exam reveals no gallop and no friction rub.  Murmur heard.  Medium-pitched blowing holosystolic murmur is present with a grade of 1/6 at the apex. Split S2  Pulmonary/Chest: Effort normal and breath sounds normal. No respiratory distress. He has no wheezes.  Abdominal: Soft. Bowel sounds are normal. He exhibits no distension. There is no tenderness. There is no rebound.  Musculoskeletal: Normal range of motion. He exhibits no edema.  Neurological: He is alert and oriented to person, place, and time.  Psychiatric: He has a normal mood and affect. His behavior is normal. Judgment and thought content normal.    Adult ECG Report  Rate: 57 ;  Rhythm: sinus bradycardia and RBBB. Otherwise normal axis, intervals and durations; (no change from previous EKG during my visit February 2018, however compared to the August 2018, lead placement is clearly different)  Narrative Interpretation: Stable EKG   Other studies Reviewed: Additional studies/ records that were reviewed today include:  Recent Labs:  Due to be checked by PCP  Lab Results  Component Value Date   CHOL 137 12/10/2016   HDL 70 12/10/2016   LDLCALC 34 04/08/2016   TRIG 101  12/10/2016   CHOLHDL 2.0 12/10/2016  LDL Calc 49  Lab Results  Component Value Date   CREATININE 1.52 (H) 12/10/2016   BUN 20 12/10/2016   NA 138 12/10/2016   K 4.2 12/10/2016   CL 105 12/10/2016   CO2 21 12/10/2016    Lab Results  Component Value Date   HGBA1C 6.2 02/15/2017  -- oFF DM Meds!!!!  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  Asymptomatic.  On CPAP.      Relevant Orders   EKG 12-Lead   CAD (coronary artery disease), with 60-70% stenosis in RCA - Primary (Chronic)    Continues to be stable with no anginal symptoms.  Is very active with routine exercise.  He is on aspirin statin and ARB.  Not on beta-blocker because of bradycardia.      Relevant Orders   EKG 12-Lead   Dyslipidemia, goal LDL below 70 (Chronic)    Well-controlled on current dose of rosuvastatin.  He is well within goal for his moderate CAD with diabetes.      Essential hypertension (Chronic)    Well-controlled on current ARB-HCTZ.  No change      Murmur, cardiac (Chronic)    31-month-old soft murmurs.  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  He may have some mild MR or TR that is heard.  But with no symptoms I would not follow-up with another echo.      OSA (obstructive sleep apnea), has been cpap intolerant (Chronic)    Followed by Dr. Tresa Endo -seen in August 2018. Marland Kitchen  Seems to be doing well with no recurrent issues.      Overweight (BMI 25.0-29.9)      Current medicines are reviewed at length with the patient today. (+/- concerns) n/a The following changes have been made: n/a  Patient Instructions  NO CHANGE TO MEDICATIONS     Your physician wants you to follow-up in 12 MONTHS WITH DR Laker Thompson. You will receive a reminder letter in the mail two months in advance. If you don't receive a letter, please call our office to schedule the  follow-up  appointment.    If you need a refill on your cardiac medications before your next appointment, please call your pharmacy.     Studies Ordered:   Orders Placed This Encounter  Procedures  . EKG 12-Lead      Bryan Lemma, M.D., M.S. Interventional Cardiologist   Pager # [phone] Phone # [phone] [address] [place], [place] [address]

## 2017-03-17 NOTE — Patient Instructions (Signed)
NO CHANGE TO MEDICATIONS     Your physician wants you to follow-up in 12 MONTHS WITH DR HARDING. You will receive a reminder letter in the mail two months in advance. If you don't receive a letter, please call our office to schedule the follow-up appointment.    If you need a refill on your cardiac medications before your next appointment, please call your pharmacy.

## 2017-03-17 NOTE — Assessment & Plan Note (Signed)
Well-controlled on current dose of rosuvastatin.  He is well within goal for his moderate CAD with diabetes.

## 2017-03-17 NOTE — Assessment & Plan Note (Signed)
Well-controlled on current ARB-HCTZ.  No change

## 2017-03-25 ENCOUNTER — Encounter: Payer: Self-pay | Admitting: *Deleted

## 2017-04-05 ENCOUNTER — Other Ambulatory Visit: Payer: Self-pay | Admitting: Family Medicine

## 2017-05-04 ENCOUNTER — Ambulatory Visit
Admission: RE | Admit: 2017-05-04 | Discharge: 2017-05-04 | Disposition: A | Discharge status: Home / Self Care | Payer: Medicare Other | Source: Ambulatory Visit | Attending: Family Medicine | Admitting: Family Medicine

## 2017-05-04 ENCOUNTER — Ambulatory Visit (INDEPENDENT_AMBULATORY_CARE_PROVIDER_SITE_OTHER): Payer: Medicare Other | Admitting: Family Medicine

## 2017-05-04 ENCOUNTER — Encounter: Payer: Self-pay | Admitting: Family Medicine

## 2017-05-04 VITALS — BP 150/70 | HR 66 | Temp 98.5°F | Resp 16 | Ht 68.0 in | Wt 194.0 lb

## 2017-05-04 DIAGNOSIS — E119 Type 2 diabetes mellitus without complications: Secondary | ICD-10-CM

## 2017-05-04 DIAGNOSIS — J9811 Atelectasis: Secondary | ICD-10-CM | POA: Diagnosis not present

## 2017-05-04 DIAGNOSIS — R1013 Epigastric pain: Secondary | ICD-10-CM

## 2017-05-04 DIAGNOSIS — M5136 Other intervertebral disc degeneration, lumbar region: Secondary | ICD-10-CM

## 2017-05-04 DIAGNOSIS — I2510 Atherosclerotic heart disease of native coronary artery without angina pectoris: Secondary | ICD-10-CM

## 2017-05-04 NOTE — Progress Notes (Signed)
Subjective:    Patient ID: James Jones, male    DOB: Aug 14, 1945, 72 y.o.   MRN: [id number]  HPI4/27/17 Patient reports pain for the last 3 days. The pain begins in the left lower back/left flank area. It comes and goes in waves. It radiates to his left lower quadrant suprapubic area. There are no exacerbating or alleviating factors. Movement does not affect the pain. Nothing he does seems to trigger or alleviate the pain. The pain is not reproducible on exam. There is no tenderness to palpation in the left lower back or in the right flank. I'm unable to reproduce the pain on exam. He has no CVA tenderness. His abdomen is soft nondistended nontender with normal bowel sounds. Past medical history is significant for a GI bleed with a hemoglobin approximately 8 in 2015. CT scan was obtained at that time which revealed no pathologic process in the abdomen. There was no evidence of a AAA mentioned on the CT scan report. Patient denies any fevers or recent illnesses. Family history is significant for pancreatic cancer. His brother recently died from pancreatic cancer with similar symptoms which I was as the patient concerned.  At that time, my plan was: Patient's physical exam is unremarkable. His history sounds suspicious for a possible kidney stone given the colicky nature of the pain in the radiation of the pain from his lower back to his pelvic area. He also reports severe nausea due to the pain. This morning he woke up and decide on the toilet feeling like he needed to vomit until the pain subsided. This is concerning for possible kidney stone although he denies any gross hematuria. I'll begin by obtaining a urinalysis to evaluate for hematuria. I will also obtain a CBC CMP and a lipase to evaluate for other possible causes or signs of abdominal pain. If workup is unremarkable, I would next proceed to a CT scan of the abdomen and pelvis given the severity of the pain. At the present time the patient's pain  is 3 on a scale of 10.  Urinalysis shows no blood and it does show some protein and ketones. I want to proceed with lab work and schedule the patient for a CT scan of the abdomen and pelvis to evaluate further. I believe based on his history was most likely kidney stone. Based on his benign abdominal exam I do not believe his diverticulitis. I will have the patient start taking Flomax 0.4 mg by mouth daily and Percocet 5/325 one every 6 hours as needed for pain. Should the pain worsen I will go to the emergency room. Meanwhile try to have the patient scheduled for a CT scan tomorrow. I recommended that he discontinue metformin.  No visits with results within 1 Month(s) from this visit.  Latest known visit with results is:  Abstract on 03/04/2017  Component Date Value Ref Range Status  . HM Diabetic Eye Exam 02/28/2017 No Retinopathy  No Retinopathy Final   CT revealed: IMPRESSION: 1. No acute findings. No findings to explain left lower quadrant pain. 2. Scattered colonic diverticula without evidence of diverticulitis. 3. Small gallstone stable from the prior CT. 4. Low-density renal masses consistent with cysts, left adrenal gland thickening and small right adrenal adenoma, stable from the prior CT.   The pain in his abdomen has not really improved. He continues to generally point to the center of his abdomen and states that it radiates into his left flank there are no exacerbating or alleviating factors. However  he admits he has not been following the diet we discussed. He is eating fish and chicken and other heavy foods that may be aggravating his pancreas. I did a literature search today in the office to look at drugs that can possibly cause pancreatitis. Angiotensin receptor blockers would be the highest risk for this patient. Rosuvastatin would be a low risk but also a possibility. Given the fact he has acute kidney injury, will probably be prudent to take him off angiotensin receptor  blocker/hydrochlorothiazide anyway.  At that time, my plan was: I believe the patient has been having occasional bouts of pancreatitis. He states that he does not drink. There is no evidence of gallstone pancreatitis on the CT scan. Therefore I believe is likely medication induced. I will have the patient discontinue Diovan HCT and recheck here in one week. I will have him push fluids, I want him eating a clear liquid diet including Jell-O and chicken broth. Hopefully symptoms will be improving a week. Recheck kidney function today along with a lipase  06/20/15 The patient's pain has not changed. He is still 3 on a scale of 1-10. However the pain now seems lower in the lumbar lower back rather than the thoracic spine. It radiates around in a dermatomal pattern from approximately the level of L2 around his umbilicus bilaterally. The pain can be sharp and it can be exacerbated by twisting motions. Food seems to have no impact. At that time, my plan was: Recheck lipase to make sure it is back to normal. Also monitor kidney function. However I will schedule the patient for an MRI as I believe the pain may actually be due to lumbar degenerative disc disease and I feel it's important that we diagnosed the cause of the pain so we can determine if we can simply monitor it/ignore it versus intervention.  12/01/15 MRI revealed: 1. Congenital and acquired lumbar spinal stenosis as described. 2. Mild subarticular and moderate foraminal stenosis at L2-3, worse on the left. 3. Moderate subarticular narrowing at L3-4 is worse on the left. 4. Moderate foraminal stenosis bilaterally at L3-4. 5. Mild subarticular and severe foraminal stenosis bilaterally at L4-5. Facet spurring contributes. 6. Moderate right subarticular and foraminal narrowing at L5-S1. Left foraminal narrowing is mild.  12/01/15 Patient has done remarkably well since May and has had no pain. However on October 19, he was awoken out of sleep with  intense left lower quadrant pain. He described the pain as a bath of whitening that shot from his back to his left lower quadrant. With the emergency room where a CAT scan was obtained which showed stable renal cyst, chronic adrenal nodularity, coronary artery calcifications, but no explanation for his pain. 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  AT that time, my plan was: Patient has moderate foraminal stenosis at L2-L3 and L3-L4 which I believe is causing his lumbar radicular pain and radiating pain to his left lower quadrant. I will place the patient on a prednisone taper pack and give him a temporary prescription for oxycodone. If the pain does not improve, consider referral for epidural steroid injections. Of note the patient was mildly anemic in the hospital at 10.7. It is borderline microcytic but he is taking iron. He does have an underlying history of chronic kidney disease. However I want to obtain his GI records to see if he's had an EGD and a colonoscopy recently. I would like the patient to be on an aspirin  given his history of CAD and what we are seeing on a CT scan that I do not want him to on an aspirin if he has a history of a GI bleed. Therefore I will await his GI records prior to deciding on aspirin  07/27/16 Patient continues to complain of pain in his lower back at the level of L3. He states that he hurts on a daily basis. It is made worse when he sits up in the morning. The pain radiates from the L3 area in a bandlike pattern around his back to his abdomen. However he is now starting to have pain radiating from his lower back into his substernal area. The pain lasted 5 or 10 seconds and then resolve spontaneously. However he is also having more indigestion as well as decreased appetite. He denies any angina. He is walking 4 miles every other day with no chest pain and no shortness of breath.  He also complains of lower abdominal pain every morning  after sleeping throughout the night and improves with bowel movement. He denies any blood in his stool but he does report black tarry stools however he is taking an iron supplement.  At that time, my plan was: I believe the patient has multifactorial pain. I still believe the majority of the pain radiating from his back into his lower abdomen is musculoskeletal in nature and related to the degenerative disc disease that he has at L3. I recommended an orthopedic consultation for possible epidural steroid injections and I will arrange that. However I cannot explain indigestion, decreased appetite, and substernal chest pain based on degenerative disc disease at L3. Furthermore I cannot explain the chronic lower abdominal pain improves with defecation in the morning. I believe some of his symptoms are likely secondary to gastroesophageal reflux disease and so started the patient on samples of Dexilant 60 mg a day. However I'm also going to consult his gastroenterologist to rule out other possibilities of chronic abdominal pain. Some of this could be due to constipation or maybe even IBS however I would like a second opinion given the chronicity and the persistence of the abdominal pain  05/04/17 Patient presents today telling me that it is time for surgery for his back.  However his history is quite confusing.  When I asked the patient where he is hurting at the most, he takes both hands and grabs just below his nipple on either side clutching the superior portion of the abdominus rectus muscle.  He states that for the last 6 months, whenever he tries to sit up or move severe pain will grab him in this area.  It lasts a split second and then resolves.  He denies any chest pain.  He denies any cough.  He denies any hemoptysis.  He denies any shortness of breath with activity.  In fact, this morning, he was able to walk 4 miles as he does frequently with no problems.  The pain is more superficial and muscular.  I had  the patient lay down on the exam table and when I pressed on his upper abdomen, he is extremely tender in the superior portion of the rectus abdominis bilaterally.  I then had the patient perform a sit up while I resisted the motion and this exacerbated and reproduced the pain.  This leads me to believe that he is strained his rectus abdominis muscle.  I explained to the patient that this has nothing to do with his low back and that surgery  would not correct that however he then states that he is in severe pain in his lower back as well.  This is located at approximately the level of L3-L4 and radiates around his abdomen like a belt.  When he stands up, he hunches forward due to the pain in his lower back.  I had referred him to orthopedist in the past.  He states that they simply recommended physical therapy.  He saw her once and he states that she did not do anything to help him.  Past Medical History:  Diagnosis Date  . 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 CAD (coronary artery disease), with 60-70% stenosis in RCA 03/18/2012   FFR 0.84; EF 60-65%  . 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 Diabetes mellitus without complication (HCC)   . History of GI bleed January 2015   No obvious findings on EGD/colonoscopy  . Hyperlipidemia   . Hypertension   . OSA (obstructive sleep apnea) 03/18/2012   Doing better with CPAP  . Pulmonary hypertension (HCC) 12/10/2010   ECHO:  Mild PH,mild LVH; PA pressures estimated 30-40 mmHg  . 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 APPENDECTOMY  1974  . CARDIAC CATHETERIZATION  03/27/2010   Moderate mid RCA lesion,right radial approach,normal EF  . CARPAL TUNNEL RELEASE    . EYE SURGERY  1960   Left eye  . HEMORRHOID SURGERY    . LEFT HEART CATHETERIZATION WITH CORONARY  ANGIOGRAM N/A 03/17/2012   Procedure: LEFT HEART CATHETERIZATION WITH CORONARY ANGIOGRAM;  Surgeon: Marykay Lex, MD;  Location: [facility] CATH LAB;  Service: Cardiovascular: Fractional Flow Reserve Measurement of mid RCA 60-70% stenosis = 0.84. Otherwise mild LCA CAD.  Normal EF & EDP  . SHOULDER ARTHROSCOPY  2006   Current Outpatient Medications on File Prior to Visit  Medication Sig Dispense Refill  . aspirin EC 81 MG tablet Take 81 mg by mouth every other day.    . Cholecalciferol (VITAMIN D-3) 1000 UNITS CAPS Take by mouth daily. 2 capsules.    . COLCRYS 0.6 MG tablet TAKE 1 TABLET BY MOUTH 2 TIMES DAILY AS NEEDED 30 tablet 3  . ferrous sulfate 325 (65 FE) MG tablet Take 1 tablet (325 mg total) by mouth 2 (two) times daily with a meal.  3  . fish oil-omega-3 fatty acids 1000 MG capsule Take 1 g by mouth daily.    Marland Kitchen glucose blood (FREESTYLE PRECISION NEO TEST) test strip Use 1x a day - to use with the [facility] receiver/glucometer 50 each 11  . glucose blood (FREESTYLE TEST STRIPS) test strip Use to test blood sugar 2 times daily as instructed. Dx: E11.59 200 each 3  . Multiple Vitamin (MULTIVITAMIN WITH MINERALS) TABS Take 1 tablet by mouth daily.    . pantoprazole (PROTONIX) 40 MG tablet Take 1 tablet by mouth daily.    . Potassium 99 MG TABS Take 1 tablet by mouth daily.    Marland Kitchen PRESCRIPTION MEDICATION Uses C-PAP at bedtime    . rosuvastatin (CRESTOR) 40 MG tablet Take 1 tablet (40 mg total) by mouth daily. 90 tablet 2  . valsartan-hydrochlorothiazide (DIOVAN-HCT) 320-25 MG tablet TAKE 1 TABLET EVERY DAY 90 tablet 3   No current facility-administered medications on file prior to visit.    No Known Allergies Social History  Socioeconomic History  . Marital status: Married    Spouse name: Not on file  . Number of children: Not on file  . Years of education: Not on file  . Highest education level: Not on file  Occupational History  . Not on file  Social Needs  . Financial resource  strain: Not on file  . Food insecurity:    Worry: Not on file    Inability: Not on file  . Transportation needs:    Medical: Not on file    Non-medical: Not on file  Tobacco Use  . Smoking status: Former Smoker    Packs/day: 1.00    Types: Cigarettes    Last attempt to quit: 04/12/2012    Years since quitting: 5.0  . Smokeless tobacco: Never Used  Substance and Sexual Activity  . Alcohol use: Yes    Alcohol/week: 0.6 oz    Types: 1 Standard drinks or equivalent per week  . Drug use: Yes    Types: Marijuana    Comment: cannibus  . Sexual activity: Not on file  Lifestyle  . Physical activity:    Days per week: Not on file    Minutes per session: Not on file  . Stress: Not on file  Relationships  . Social connections:    Talks on phone: Not on file    Gets together: Not on file    Attends religious service: Not on file    Active member of club or organization: Not on file    Attends meetings of clubs or organizations: Not on file    Relationship status: Not on file  . Intimate partner violence:    Fear of current or ex partner: Not on file    Emotionally abused: Not on file    Physically abused: Not on file    Forced sexual activity: Not on file  Other Topics Concern  . Not on file  Social History Narrative   Married, father of 2, grandfather of 3.   He is a former smoker of about a pack to pack and half cigarettes a day -- he quit in March of this year.    He is an avid exerciser working at least 4-5 days a week doing her walking or stationary bike.      Review of Systems  Musculoskeletal: Positive for back pain.  All other systems reviewed and are negative.      Objective:   Physical Exam  Constitutional: He appears well-developed and well-nourished.  Neck: Neck supple. No JVD present. No thyromegaly present.  Cardiovascular: Normal rate, regular rhythm, normal heart sounds and intact distal pulses.  No murmur heard. Pulmonary/Chest: Effort normal and breath  sounds normal. No respiratory distress. He has no wheezes. He has no rales.  Abdominal: Soft. Bowel sounds are normal. He exhibits no distension and no mass. There is no tenderness. There is no rebound and no guarding.    Musculoskeletal: He exhibits no edema.       Lumbar back: He exhibits decreased range of motion and pain. He exhibits no bony tenderness and no spasm.       Back:  Lymphadenopathy:    He has no cervical adenopathy.  Vitals reviewed.         Assessment & Plan:  Epigastric pain - Plan: Lipase, DG Chest 2 View  Controlled type 2 diabetes mellitus without complication, without long-term current use of insulin (HCC) - Plan: CBC with Differential/Platelet, COMPLETE METABOLIC PANEL WITH GFR, Lipid panel, Hemoglobin  A1c  DDD (degenerative disc disease), lumbar - Plan: Ambulatory referral to Orthopedic Surgery  His areas of pain are outlined in purple on the diagrams above.  I believe the abdominal pain is due to a rectus abdominus strain and is muscular.  However, patient is a very difficult historian and his pain seems to fluctuate and change in character and location.  Therefore, I will also check lipase, CXR, CBC, and CMP.  I believe the pain in his lower back may possibly respond to epidural steroid injections primarily at L4-L5 due to the severe by foraminal stenosis at that level.  Therefore I will reconsult orthopedic surgery.  Patient is overdue for fasting lab work to monitor his diabetes and therefore I will also check a hemoglobin A1c, fasting lipid panel, and CMP.

## 2017-05-05 LAB — COMPLETE METABOLIC PANEL WITH GFR
AG Ratio: 1.8 (calc) (ref 1.0–2.5)
ALKALINE PHOSPHATASE (APISO): 63 U/L (ref 40–115)
ALT: 16 U/L (ref 9–46)
AST: 25 U/L (ref 10–35)
Albumin: 4.3 g/dL (ref 3.6–5.1)
BUN/Creatinine Ratio: 10 (calc) (ref 6–22)
BUN: 19 mg/dL (ref 7–25)
CHLORIDE: 105 mmol/L (ref 98–110)
CO2: 25 mmol/L (ref 20–32)
Calcium: 9.7 mg/dL (ref 8.6–10.3)
Creat: 1.82 mg/dL — ABNORMAL HIGH (ref 0.70–1.18)
GFR, EST AFRICAN AMERICAN: 42 mL/min/{1.73_m2} — AB (ref >=60)
GFR, Est Non African American: 36 mL/min/{1.73_m2} — ABNORMAL LOW (ref >=60)
GLUCOSE: 99 mg/dL (ref 65–99)
Globulin: 2.4 g/dL (calc) (ref 1.9–3.7)
Potassium: 4 mmol/L (ref 3.5–5.3)
Sodium: 140 mmol/L (ref 135–146)
TOTAL PROTEIN: 6.7 g/dL (ref 6.1–8.1)
Total Bilirubin: 0.5 mg/dL (ref 0.2–1.2)

## 2017-05-05 LAB — CBC WITH DIFFERENTIAL/PLATELET
BASOS PCT: 0.4 %
Basophils Absolute: 38 cells/uL (ref 0–200)
EOS ABS: 38 {cells}/uL (ref 15–500)
Eosinophils Relative: 0.4 %
HCT: 37.7 % — ABNORMAL LOW (ref 38.5–50.0)
Hemoglobin: 12.7 g/dL — ABNORMAL LOW (ref 13.2–17.1)
Lymphs Abs: 1188 cells/uL (ref 850–3900)
MCH: 27.9 pg (ref 27.0–33.0)
MCHC: 33.7 g/dL (ref 32.0–36.0)
MCV: 82.9 fL (ref 80.0–100.0)
MONOS PCT: 7.5 %
MPV: 9.7 fL (ref 7.5–12.5)
Neutro Abs: 7524 cells/uL (ref 1500–7800)
Neutrophils Relative %: 79.2 %
PLATELETS: 274 10*3/uL (ref 140–400)
RBC: 4.55 10*6/uL (ref 4.20–5.80)
RDW: 14 % (ref 11.0–15.0)
TOTAL LYMPHOCYTE: 12.5 %
WBC mixed population: 713 cells/uL (ref 200–950)
WBC: 9.5 10*3/uL (ref 3.8–10.8)

## 2017-05-05 LAB — LIPID PANEL
CHOLESTEROL: 206 mg/dL — AB (ref <200)
HDL: 92 mg/dL (ref >40)
LDL CHOLESTEROL (CALC): 97 mg/dL
Non-HDL Cholesterol (Calc): 114 mg/dL (calc) (ref <130)
TRIGLYCERIDES: 76 mg/dL (ref <150)
Total CHOL/HDL Ratio: 2.2 (calc) (ref <5.0)

## 2017-05-05 LAB — HEMOGLOBIN A1C
EAG (MMOL/L): 7.4 (calc)
Hgb A1c MFr Bld: 6.3 % of total Hgb — ABNORMAL HIGH (ref <5.7)
MEAN PLASMA GLUCOSE: 134 (calc)

## 2017-05-05 LAB — LIPASE: Lipase: 59 U/L (ref 7–60)

## 2017-05-06 ENCOUNTER — Other Ambulatory Visit: Payer: Self-pay | Admitting: Family Medicine

## 2017-05-06 DIAGNOSIS — R944 Abnormal results of kidney function studies: Secondary | ICD-10-CM

## 2017-05-07 DIAGNOSIS — M5136 Other intervertebral disc degeneration, lumbar region: Secondary | ICD-10-CM | POA: Diagnosis not present

## 2017-05-07 DIAGNOSIS — M545 Low back pain: Secondary | ICD-10-CM | POA: Diagnosis not present

## 2017-05-12 ENCOUNTER — Telehealth: Payer: Self-pay | Admitting: *Deleted

## 2017-05-12 NOTE — Telephone Encounter (Signed)
Received call from patient.   Reports that he is being seen by Dr. Ethelene Halamos and is to be referred to Neuro. States that he has seen one in the past that [facility]. Requesting name of Neuro.  MD please advise.

## 2017-05-12 NOTE — Telephone Encounter (Signed)
Call placed to patient and patient made aware per VM.  

## 2017-05-12 NOTE — Telephone Encounter (Signed)
I do not have a record of him seeing neurology in the past.

## 2017-05-17 DIAGNOSIS — H6122 Impacted cerumen, left ear: Secondary | ICD-10-CM | POA: Diagnosis not present

## 2017-05-17 DIAGNOSIS — H6063 Unspecified chronic otitis externa, bilateral: Secondary | ICD-10-CM | POA: Diagnosis not present

## 2017-05-18 ENCOUNTER — Other Ambulatory Visit: Payer: Self-pay

## 2017-05-18 ENCOUNTER — Ambulatory Visit (INDEPENDENT_AMBULATORY_CARE_PROVIDER_SITE_OTHER): Payer: Medicare Other | Admitting: Physician Assistant

## 2017-05-18 ENCOUNTER — Encounter: Payer: Self-pay | Admitting: Physician Assistant

## 2017-05-18 VITALS — BP 170/78 | HR 71 | Temp 97.9°F | Resp 16 | Ht 68.0 in | Wt 197.8 lb

## 2017-05-18 DIAGNOSIS — I2510 Atherosclerotic heart disease of native coronary artery without angina pectoris: Secondary | ICD-10-CM

## 2017-05-18 DIAGNOSIS — M109 Gout, unspecified: Secondary | ICD-10-CM

## 2017-05-18 MED ORDER — PREDNISONE 20 MG PO TABS: ORAL_TABLET | ORAL | 0 refills | Status: DC

## 2017-05-18 NOTE — Progress Notes (Signed)
Patient ID: James Jones MRN: [id number], DOB: [birth year], 72 y.o. Date of Encounter: @DATE @  Chief Complaint:  Chief Complaint  Patient presents with  . right knee pain    started 4 days ago     HPI: 72 y.o. year old male  presents with above.    Reports that these of the same type symptoms that he has had when he has had gout in the past.  Reports that he has had gout flare affect his knees, elbows, feet in the past.  Says that whenever he has gout flare that it causes same type of pain and swelling. Reports that this right knee started to hurt 4 or 5 days ago.  It has progressively worsened since then with increasing pain and swelling.  Now his left knee is starting to feel the same way that the right knee was feeling when it started bothering him.  Says that he knows it is a gout flare that has started in the right knee and now affecting the left knee.  Reports that he has had no injury or trauma to the right knee.  Says that it just started having pain and swelling "for no reason".  Says that he started noticing it on Monday but he still did his [place] Walk that he does on Mondays and Wednesdays.  States that he was due to do that walk again today but could not do his walk.  During his office visit he is standing.  Says that it hurts worse when he sits with his knee bent to ask if he can just stand. He does sit for brief amount of time for examination.   Also reviewed his history of diabetes and CKD.   Past Medical History:  Diagnosis Date  . 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 CAD (coronary artery disease), with 60-70% stenosis in RCA 03/18/2012   FFR 0.84; EF 60-65%  . 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 Diabetes mellitus without complication (HCC)   . History of GI bleed January 2015   No obvious findings on EGD/colonoscopy  . Hyperlipidemia   . Hypertension   . OSA (obstructive sleep apnea) 03/18/2012   Doing better with CPAP    . Pulmonary hypertension (HCC) 12/10/2010   ECHO:  Mild PH,mild LVH; PA pressures estimated 30-40 mmHg  . 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 Wenckebach 04/09/2012-05/09/2012   Event monitor; usually during sleeping hours; therefore not on beta blocker     Home Meds: Outpatient Medications Prior to Visit  Medication Sig Dispense Refill  . aspirin EC 81 MG tablet Take 81 mg by mouth every other day.    . Cholecalciferol (VITAMIN D-3) 1000 UNITS CAPS Take by mouth daily. 2 capsules.    . COLCRYS 0.6 MG tablet TAKE 1 TABLET BY MOUTH 2 TIMES DAILY AS NEEDED 30 tablet 3  . ferrous sulfate 325 (65 FE) MG tablet Take 1 tablet (325 mg total) by mouth 2 (two) times daily with a meal.  3  . fish oil-omega-3 fatty acids 1000 MG capsule Take 1 g by mouth daily.    Marland Kitchen glucose blood (FREESTYLE PRECISION NEO TEST) test strip Use 1x a day - to use with the [facility] receiver/glucometer 50 each 11  . glucose blood (FREESTYLE TEST STRIPS) test strip Use to test blood sugar 2 times daily as instructed. Dx: E11.59 200 each 3  . Multiple Vitamin (MULTIVITAMIN WITH MINERALS) TABS Take 1 tablet  by mouth daily.    . pantoprazole (PROTONIX) 40 MG tablet Take 1 tablet by mouth daily.    . Potassium 99 MG TABS Take 1 tablet by mouth daily.    Marland Kitchen PRESCRIPTION MEDICATION Uses C-PAP at bedtime    . rosuvastatin (CRESTOR) 40 MG tablet Take 1 tablet (40 mg total) by mouth daily. 90 tablet 2  . valsartan-hydrochlorothiazide (DIOVAN-HCT) 320-25 MG tablet TAKE 1 TABLET EVERY DAY 90 tablet 3   No facility-administered medications prior to visit.     Allergies: No Known Allergies  Social History   Socioeconomic History  . Marital status: Married    Spouse name: Not on file  . Number of children: Not on file  . Years of education: Not on file  . Highest education level: Not on file  Occupational History  . Not on file  Social Needs  . Financial resource strain: Not on file  . Food insecurity:    Worry: Not on file     Inability: Not on file  . Transportation needs:    Medical: Not on file    Non-medical: Not on file  Tobacco Use  . Smoking status: Former Smoker    Packs/day: 1.00    Types: Cigarettes    Last attempt to quit: 04/12/2012    Years since quitting: 5.1  . Smokeless tobacco: Never Used  Substance and Sexual Activity  . Alcohol use: Yes    Alcohol/week: 0.6 oz    Types: 1 Standard drinks or equivalent per week  . Drug use: Yes    Types: Marijuana    Comment: cannibus  . Sexual activity: Not on file  Lifestyle  . Physical activity:    Days per week: Not on file    Minutes per session: Not on file  . Stress: Not on file  Relationships  . Social connections:    Talks on phone: Not on file    Gets together: Not on file    Attends religious service: Not on file    Active member of club or organization: Not on file    Attends meetings of clubs or organizations: Not on file    Relationship status: Not on file  . Intimate partner violence:    Fear of current or ex partner: Not on file    Emotionally abused: Not on file    Physically abused: Not on file    Forced sexual activity: Not on file  Other Topics Concern  . Not on file  Social History Narrative   Married, father of 2, grandfather of 3.   He is a former smoker of about a pack to pack and half cigarettes a day -- he quit in March of this year.    He is an avid exerciser working at least 4-5 days a week doing her walking or stationary bike.    Family History  Problem Relation Age of Onset  . Kidney failure Mother   . Coronary artery disease Father   . Kidney failure Sister      Review of Systems:  See HPI for pertinent ROS. All other ROS negative.    Physical Exam: Blood pressure (!) 150/78, pulse 71, temperature 97.9 F (36.6 C), temperature source Oral, resp. rate 16, height 5\' 8"  (1.727 m), weight 89.7 kg (197 lb 12.8 oz), SpO2 95 %., Body mass index is 30.08 kg/m. General: WNWD AAM. Appears in no acute  distress. Neck: Supple. No thyromegaly. No lymphadenopathy. Lungs: Clear bilaterally to auscultation without wheezes,  rales, or rhonchi. Breathing is unlabored. Heart: RRR with S1 S2. No murmurs, rubs, or gallops. Musculoskeletal:  Strength and tone normal for age. Right Knee: entire anterior knee is swollen with mild erythema, mild warmth.  Extremities/Skin: Warm and dry. Neuro: Alert and oriented X 3. Moves all extremities spontaneously. Gait is normal. CNII-XII grossly in tact. Psych:  Responds to questions appropriately with a normal affect.     ASSESSMENT AND PLAN:  72 y.o. year old male with  1. Acute gout of right knee, unspecified cause He is to take the prednisone taper as directed.  Cautioned him that this will cause his sugar to go up.  He is to follow-up if knee worsens despite this treatment or if it does not return to normal baseline upon completion of prednisone. Reviewed that he is on no urate lowering medication.  Will defer this to his PCP and renal doctor. - predniSONE (DELTASONE) 20 MG tablet; Take 3 daily for 2 days, then 2 daily for 2 days, then 1 daily for 2 days.  Dispense: 12 tablet; Refill: 0   Signed, [address]Akeelah Seppala Beth HermannDixon, [place], [facility] AssociationBS[facility] 05/18/2017 12:37 PM

## 2017-06-01 DIAGNOSIS — M25561 Pain in right knee: Secondary | ICD-10-CM | POA: Diagnosis not present

## 2017-06-15 ENCOUNTER — Ambulatory Visit: Payer: Medicare Other | Admitting: Internal Medicine

## 2017-06-21 ENCOUNTER — Ambulatory Visit (INDEPENDENT_AMBULATORY_CARE_PROVIDER_SITE_OTHER): Payer: Medicare Other | Admitting: Family Medicine

## 2017-06-21 ENCOUNTER — Encounter: Payer: Self-pay | Admitting: Family Medicine

## 2017-06-21 VITALS — BP 130/68 | HR 70 | Temp 98.2°F | Resp 16 | Ht 68.0 in | Wt 201.0 lb

## 2017-06-21 DIAGNOSIS — M67432 Ganglion, left wrist: Secondary | ICD-10-CM | POA: Diagnosis not present

## 2017-06-21 DIAGNOSIS — I2510 Atherosclerotic heart disease of native coronary artery without angina pectoris: Secondary | ICD-10-CM

## 2017-06-21 NOTE — Progress Notes (Signed)
Subjective:    Patient ID: James Jones, male    DOB: [birth year], 72 y.o.   MRN: [id number]  HPI Patient presents with a 2-week history of a painless lump forming on the volar surface of his left wrist near the [facility] joint, first CMC joint.  It is roughly a centimeter in diameter.  It is soft.  It is spongy.  It is well-circumscribed.  Given the location and the texture, it appears to be a ganglion cyst.  The patient is here today to discuss treatment options. Past Medical History:  Diagnosis Date  . 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 CAD (coronary artery disease), with 60-70% stenosis in RCA 03/18/2012   FFR 0.84; EF 60-65%  . 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 Diabetes mellitus without complication (HCC)   . History of GI bleed January 2015   No obvious findings on EGD/colonoscopy  . Hyperlipidemia   . Hypertension   . OSA (obstructive sleep apnea) 03/18/2012   Doing better with CPAP  . Pulmonary hypertension (HCC) 12/10/2010   ECHO:  Mild PH,mild LVH; PA pressures estimated 30-40 mmHg  . 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 APPENDECTOMY  1974  . CARDIAC CATHETERIZATION  03/27/2010   Moderate mid RCA lesion,right radial approach,normal EF  . CARPAL TUNNEL RELEASE    . EYE SURGERY  1960   Left eye  . HEMORRHOID SURGERY    . LEFT HEART CATHETERIZATION WITH CORONARY ANGIOGRAM N/A 03/17/2012   Procedure: LEFT HEART CATHETERIZATION WITH CORONARY ANGIOGRAM;  Surgeon: Marykay Lex, MD;  Location: [facility] CATH LAB;  Service: Cardiovascular: Fractional Flow Reserve Measurement of mid RCA 60-70% stenosis = 0.84. Otherwise mild LCA CAD.  Normal EF & EDP  . SHOULDER ARTHROSCOPY  2006   Current Outpatient Medications on File Prior to Visit  Medication Sig Dispense Refill  . aspirin EC 81 MG tablet  Take 81 mg by mouth every other day.    . Cholecalciferol (VITAMIN D-3) 1000 UNITS CAPS Take by mouth daily. 2 capsules.    . COLCRYS 0.6 MG tablet TAKE 1 TABLET BY MOUTH 2 TIMES DAILY AS NEEDED 30 tablet 3  . ferrous sulfate 325 (65 FE) MG tablet Take 1 tablet (325 mg total) by mouth 2 (two) times daily with a meal.  3  . fish oil-omega-3 fatty acids 1000 MG capsule Take 1 g by mouth daily.    Marland Kitchen glucose blood (FREESTYLE PRECISION NEO TEST) test strip Use 1x a day - to use with the [facility] receiver/glucometer 50 each 11  . glucose blood (FREESTYLE TEST STRIPS) test strip Use to test blood sugar 2 times daily as instructed. Dx: E11.59 200 each 3  . Multiple Vitamin (MULTIVITAMIN WITH MINERALS) TABS Take 1 tablet by mouth daily.    . pantoprazole (PROTONIX) 40 MG tablet Take 1 tablet by mouth daily.    . Potassium 99 MG TABS Take 1 tablet by mouth daily.    Marland Kitchen PRESCRIPTION MEDICATION Uses C-PAP at bedtime    . rosuvastatin (CRESTOR) 40 MG tablet Take 1 tablet (40 mg total) by mouth daily. 90 tablet 2  . valsartan-hydrochlorothiazide (DIOVAN-HCT) 320-25 MG tablet TAKE 1 TABLET EVERY DAY 90 tablet 3   No current facility-administered medications on file prior to visit.    No Known Allergies Social  History   Socioeconomic History  . Marital status: Married    Spouse name: Not on file  . Number of children: Not on file  . Years of education: Not on file  . Highest education level: Not on file  Occupational History  . Not on file  Social Needs  . Financial resource strain: Not on file  . Food insecurity:    Worry: Not on file    Inability: Not on file  . Transportation needs:    Medical: Not on file    Non-medical: Not on file  Tobacco Use  . Smoking status: Former Smoker    Packs/day: 1.00    Types: Cigarettes    Last attempt to quit: 04/12/2012    Years since quitting: 5.1  . Smokeless tobacco: Never Used  Substance and Sexual Activity  . Alcohol use: Yes    Alcohol/week:  0.6 oz    Types: 1 Standard drinks or equivalent per week  . Drug use: Yes    Types: Marijuana    Comment: cannibus  . Sexual activity: Not on file  Lifestyle  . Physical activity:    Days per week: Not on file    Minutes per session: Not on file  . Stress: Not on file  Relationships  . Social connections:    Talks on phone: Not on file    Gets together: Not on file    Attends religious service: Not on file    Active member of club or organization: Not on file    Attends meetings of clubs or organizations: Not on file    Relationship status: Not on file  . Intimate partner violence:    Fear of current or ex partner: Not on file    Emotionally abused: Not on file    Physically abused: Not on file    Forced sexual activity: Not on file  Other Topics Concern  . Not on file  Social History Narrative   Married, father of 2, grandfather of 3.   He is a former smoker of about a pack to pack and half cigarettes a day -- he quit in March of this year.    He is an avid exerciser working at least 4-5 days a week doing her walking or stationary bike.      Review of Systems  All other systems reviewed and are negative.      Objective:   Physical Exam  Cardiovascular: Normal rate, regular rhythm and normal heart sounds.  No murmur heard. Pulmonary/Chest: Effort normal and breath sounds normal. No respiratory distress. He has no wheezes. He has no rales.  Abdominal: Soft. Bowel sounds are normal. He exhibits no distension. There is no tenderness.  Musculoskeletal:       Left wrist: He exhibits deformity.       Arms: Vitals reviewed.         Assessment & Plan:  Ganglion cyst of wrist, left  I believe this is a benign ganglion cyst.  I recommended observation versus surgical excision.  We also discussed cyst sac aspiration using sterile technique and therapeutic injection with cortisone.  After our discussion, the patient elects simply to monitor it.  If worsening, I would be  happy to consult hand surgeon.

## 2017-07-05 DIAGNOSIS — N281 Cyst of kidney, acquired: Secondary | ICD-10-CM | POA: Diagnosis not present

## 2017-07-05 DIAGNOSIS — R809 Proteinuria, unspecified: Secondary | ICD-10-CM | POA: Diagnosis not present

## 2017-07-05 DIAGNOSIS — N183 Chronic kidney disease, stage 3 (moderate): Secondary | ICD-10-CM | POA: Diagnosis not present

## 2017-07-05 DIAGNOSIS — I10 Essential (primary) hypertension: Secondary | ICD-10-CM | POA: Diagnosis not present

## 2017-07-13 DIAGNOSIS — N183 Chronic kidney disease, stage 3 (moderate): Secondary | ICD-10-CM | POA: Diagnosis not present

## 2017-07-13 DIAGNOSIS — D509 Iron deficiency anemia, unspecified: Secondary | ICD-10-CM | POA: Diagnosis not present

## 2017-07-13 DIAGNOSIS — I10 Essential (primary) hypertension: Secondary | ICD-10-CM | POA: Diagnosis not present

## 2017-07-13 DIAGNOSIS — E559 Vitamin D deficiency, unspecified: Secondary | ICD-10-CM | POA: Diagnosis not present

## 2017-07-13 DIAGNOSIS — Z1159 Encounter for screening for other viral diseases: Secondary | ICD-10-CM | POA: Diagnosis not present

## 2017-07-13 DIAGNOSIS — R809 Proteinuria, unspecified: Secondary | ICD-10-CM | POA: Diagnosis not present

## 2017-07-13 DIAGNOSIS — Z79899 Other long term (current) drug therapy: Secondary | ICD-10-CM | POA: Diagnosis not present

## 2017-07-14 ENCOUNTER — Other Ambulatory Visit (HOSPITAL_COMMUNITY): Payer: Self-pay | Admitting: Medical

## 2017-07-14 DIAGNOSIS — N183 Chronic kidney disease, stage 3 unspecified: Secondary | ICD-10-CM

## 2017-07-20 ENCOUNTER — Ambulatory Visit (HOSPITAL_COMMUNITY)
Admission: RE | Admit: 2017-07-20 | Discharge: 2017-07-20 | Disposition: A | Discharge status: Home / Self Care | Payer: Medicare Other | Source: Ambulatory Visit | Attending: Medical | Admitting: Medical

## 2017-07-20 DIAGNOSIS — N183 Chronic kidney disease, stage 3 unspecified: Secondary | ICD-10-CM

## 2017-07-20 DIAGNOSIS — N281 Cyst of kidney, acquired: Secondary | ICD-10-CM | POA: Diagnosis not present

## 2017-07-21 ENCOUNTER — Ambulatory Visit (INDEPENDENT_AMBULATORY_CARE_PROVIDER_SITE_OTHER): Payer: Medicare Other | Admitting: Internal Medicine

## 2017-07-21 ENCOUNTER — Encounter: Payer: Self-pay | Admitting: Internal Medicine

## 2017-07-21 VITALS — BP 164/80 | HR 72 | Ht 68.0 in | Wt 196.4 lb

## 2017-07-21 DIAGNOSIS — E785 Hyperlipidemia, unspecified: Secondary | ICD-10-CM

## 2017-07-21 DIAGNOSIS — E663 Overweight: Secondary | ICD-10-CM

## 2017-07-21 DIAGNOSIS — I2510 Atherosclerotic heart disease of native coronary artery without angina pectoris: Secondary | ICD-10-CM | POA: Diagnosis not present

## 2017-07-21 DIAGNOSIS — E1159 Type 2 diabetes mellitus with other circulatory complications: Secondary | ICD-10-CM

## 2017-07-21 LAB — POCT GLYCOSYLATED HEMOGLOBIN (HGB A1C): HEMOGLOBIN A1C: 6.9 % — AB (ref 4.0–5.6)

## 2017-07-21 MED ORDER — GLUCOSE BLOOD VI STRP: ORAL_STRIP | 3 refills | Status: DC

## 2017-07-21 MED ORDER — METFORMIN HCL ER 500 MG PO TB24: 500.0000 mg | ORAL_TABLET | Freq: Every day | ORAL | 3 refills | Status: DC

## 2017-07-21 NOTE — Addendum Note (Signed)
Addended by: Yolande JollyLAWSON, Anelise Staron on: 07/21/2017 03:04 PM   Modules accepted: Orders

## 2017-07-21 NOTE — Patient Instructions (Addendum)
Please restart Metformin ER 500 mg with breakfast.  Please return in 4 months with your sugar log.   Your HbA1c is 6.9%!

## 2017-07-21 NOTE — Progress Notes (Signed)
Patient ID: James Jones, male   DOB: 1945-05-19, 72 y.o.   MRN: [id number]  HPI: James Jones is a 72 y.o.-year-old male, returning for f/u for DM2, dx in ~2005, non-insulin-dependent, lately more controlled , with complications (heart ds., CKD). Last visit 5 months ago. PCP: Dr. Lynnea FerrierWarren Pickard  He has a tooth abscess and he is on penicillin for this, before tooth extraction.  Last hemoglobin A1c was: Lab Results  Component Value Date   HGBA1C 6.3 (H) 05/04/2017   HGBA1C 6.2 02/15/2017   HGBA1C 6.2 10/21/2016  04/11/2014: HbA1c 8.4% 01/08/2014: HbA1c 7.7% He had steroid inj for gout in his elbows - 12/2013. He also had a steroid inj in knee fall 2016, too.   Patient was on a low-dose metformin ER before: 500 mg once a day in a.m. However, at last visit, we stopped the medication due to excellent blood sugars + occasional low very mild lows and decreased appetite  He was previously on Januvia. We stopped Glipizide ER 2.5 mg daily in 07/2015. Had nausea, vomiting, loss of appetite with regular metformin.  He was on Actos. He was on insulin before (Lantus) - came off years ago.  Pt checks his sugars 1-3X a day: - am:  95-116 >> 72-110 >> 81-106 >> 101-141, 168 - 2h after b'fast:  n/c >> 68, 71-156, 161 >> 107 - before lunch: 80's-111 >> 54 x1, 80, 133 >> 96-110 - 2h after lunch: 61 (did no eat)-120 >> 107, 118 >> n/c - before dinner:  90-110 >> 71-135 >> 72, 82, 168 - 2h after dinner:  70-123 >> 84-147 >> 110-171 - bedtime: 123, 146 >> 89-135 >> n/c - nighttime: 89 >> 112 >> n/c >> 100 Lowest 54 x1 >> 72; he has hypoglycemia awareness  in the 70s. Highest sugar was 161 >> 171.  Glucometer: Freestyle [employer] did not cover Freestyle Libre CGM.  Pt's meals are: - Breakfast: bacon + eggs, sausage, grits, oatmeal, cereal, toast wheat - Lunch: BLT, soups, fruit, nuts - Dinner: chicken, pork + greens, rice  - Snacks: 1 a day: pretzels; carrots He is still exercising 3 times a week  eek ([facility]  -+ CKD - recently saw  nephrology (had an U/S >> reviewed the report: Renal cysts, OTW normal), last BUN/creatinine:  Lab Results  Component Value Date   BUN 19 05/04/2017   CREATININE 1.82 (H) 05/04/2017  05/24/2014: 34/1.64 01/08/2014: 18/1.15 On valsartan. -+ HL; last set of lipids: Lab Results  Component Value Date   CHOL 206 (H) 05/04/2017   HDL 92 05/04/2017   LDLCALC 97 05/04/2017   TRIG 76 05/04/2017   CHOLHDL 2.2 05/04/2017  04/11/2014: 206/143/70/106 01/08/2014: 161/98/60/73 On rosuvastatin 40. - last eye exam was in 02/2017: No DR 18: No DR. he has history of cataract surgery in 2016 and 2017. - no numbness and tingling in his feet.  Last TSH was normal: Lab Results  Component Value Date   TSH 1.82 04/08/2016   He has a history of hand itching, relieved by Zyrtec.  ROS: Constitutional: no weight gain/no weight loss, no fatigue, no subjective hyperthermia, no subjective hypothermia Eyes: no blurry vision, no xerophthalmia ENT: no sore throat, no nodules palpated in throat, no dysphagia, no odynophagia, no hoarseness Cardiovascular: no CP/no SOB/no palpitations/no leg swelling Respiratory: no cough/no SOB/no wheezing Gastrointestinal: no N/no V/no D/no C/no acid reflux Musculoskeletal: no muscle aches/no joint aches, + ganglion cyst on the right wrist Skin: no rashes, no  hair loss Neurological: no tremors/no numbness/no tingling/no dizziness  I reviewed pt's medications, allergies, PMH, social hx, family hx, and changes were documented in the history of present illness. Otherwise, unchanged from my initial visit note.   Past Medical History:  Diagnosis Date  . 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 CAD (coronary artery disease), with 60-70% stenosis in RCA 03/18/2012   FFR 0.84; EF 60-65%  . 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 Diabetes mellitus without complication (HCC)   . History  of GI bleed January 2015   No obvious findings on EGD/colonoscopy  . Hyperlipidemia   . Hypertension   . OSA (obstructive sleep apnea) 03/18/2012   Doing better with CPAP  . Pulmonary hypertension (HCC) 12/10/2010   ECHO:  Mild PH,mild LVH; PA pressures estimated 30-40 mmHg  . 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 Wenckebach 04/09/2012-05/09/2012   Event monitor; usually during sleeping hours; therefore not on beta blocker  + Gout  Past Surgical History:  Procedure Laterality Date  . APPENDECTOMY  1974  . CARDIAC CATHETERIZATION  03/27/2010   Moderate mid RCA lesion,right radial approach,normal EF  . CARPAL TUNNEL RELEASE    . EYE SURGERY  1960   Left eye  . HEMORRHOID SURGERY    . LEFT HEART CATHETERIZATION WITH CORONARY ANGIOGRAM N/A 03/17/2012   Procedure: LEFT HEART CATHETERIZATION WITH CORONARY ANGIOGRAM;  Surgeon: Marykay Lex, MD;  Location: [facility] CATH LAB;  Service: Cardiovascular: Fractional Flow Reserve Measurement of mid RCA 60-70% stenosis = 0.84. Otherwise mild LCA CAD.  Normal EF & EDP  . SHOULDER ARTHROSCOPY  2006   History   Social History  . Marital Status: Married    Spouse Name: N/A   Occupational History  . retired   Social History Main Topics  . Smoking status: Former Smoker -- 2.00 packs/day    Quit date: 04/12/2012  . Smokeless tobacco: Never Used  . Alcohol Use: 0.5 oz/week    1 drink(s) per week  . Drug Use: Yes    Special: Marijuana     Comment: cannibus   Social History Narrative   Married, father of 2, grandfather of 3.   He is a former smoker of about a pack to pack and half cigarettes a day -- he quit in March of this year.    He is an avid exerciser working at least 4-5 days a week doing her walking or stationary bike.   Current Outpatient Medications on File Prior to Visit  Medication Sig Dispense Refill  . aspirin EC 81 MG tablet Take 81 mg by mouth every other day.    . Cholecalciferol (VITAMIN D-3) 1000 UNITS CAPS Take by mouth daily. 2 capsules.     . COLCRYS 0.6 MG tablet TAKE 1 TABLET BY MOUTH 2 TIMES DAILY AS NEEDED 30 tablet 3  . ferrous sulfate 325 (65 FE) MG tablet Take 1 tablet (325 mg total) by mouth 2 (two) times daily with a meal.  3  . fish oil-omega-3 fatty acids 1000 MG capsule Take 1 g by mouth daily.    Marland Kitchen glucose blood (FREESTYLE PRECISION NEO TEST) test strip Use 1x a day - to use with the [facility] receiver/glucometer 50 each 11  . glucose blood (FREESTYLE TEST STRIPS) test strip Use to test blood sugar 2 times daily as instructed. Dx: E11.59 200 each 3  . Multiple Vitamin (MULTIVITAMIN WITH MINERALS) TABS Take 1 tablet by mouth daily.    Marland Kitchen  pantoprazole (PROTONIX) 40 MG tablet Take 1 tablet by mouth daily.    . Potassium 99 MG TABS Take 1 tablet by mouth daily.    Marland Kitchen PRESCRIPTION MEDICATION Uses C-PAP at bedtime    . rosuvastatin (CRESTOR) 40 MG tablet Take 1 tablet (40 mg total) by mouth daily. 90 tablet 2  . valsartan-hydrochlorothiazide (DIOVAN-HCT) 320-25 MG tablet TAKE 1 TABLET EVERY DAY 90 tablet 3   No current facility-administered medications on file prior to visit.    No Known Allergies Family History  Problem Relation Age of Onset  . Kidney failure Mother   . Coronary artery disease Father   . Kidney failure Sister    PE: BP (!) 164/80   Pulse 72   Ht 5\' 8"  (1.727 m)   Wt 196 lb 6.4 oz (89.1 kg)   SpO2 98%   BMI 29.86 kg/m  Body mass index is 29.86 kg/m. Wt Readings from Last 3 Encounters:  07/21/17 196 lb 6.4 oz (89.1 kg)  06/21/17 201 lb (91.2 kg)  05/18/17 197 lb 12.8 oz (89.7 kg)   Constitutional: overweight, in NAD Eyes: PERRLA, EOMI, no exophthalmos ENT: moist mucous membranes, no thyromegaly, + small isthmic nodule palpated in lower neck, no cervical lymphadenopathy Cardiovascular: RRR, No MRG Respiratory: CTA B Gastrointestinal: abdomen soft, NT, ND, BS+ Musculoskeletal: no deformities, strength intact in all 4, + ganglion cyst on right wrist Skin: moist, warm, no  rashes Neurological: no tremor with outstretched hands, DTR normal in all 4  ASSESSMENT: 1. DM2, non-insulin-dependent, uncontrolled, with complications - heart ds - ? - cardiologist Dr. Herbie Baltimore - CKD - started to see Dr Kristian Covey  2. HL  3. Overweight  PLAN:  1. Patient with long-standing, previously uncontrolled diabetes, with much improved control at last visit, after which we were able to stop metformin 5 months ago.  At that time, sugars were great, and he also had some mild lows.  We previously stopped glipizide ER in 2017. He was previously on Januvia after being off metformin due to decreased GFR. - At this visit, sugars are still at goal, just slightly higher.  He has occasional hyperglycemic spikes.  His HbA1c is 6.9% (higher). We discussed about trying to restart the low-dose of metformin ER and he agrees. - No other changes are necessary for now-  -  I suggested to:  Patient Instructions  Please restart Metformin ER 500 mg with breakfast.  Please return in 4 months with your sugar log.   Your HbA1c is 6.9%!  - continue checking sugars at different times of the day - check 1x a day, rotating checks - advised for yearly eye exams >> he is UTD - Return to clinic in 4 mo with sugar log    2. HL - Reviewed latest lipid panel from 04/2017: Total cholesterol was high, LDL just under 100, the rest of the fractions were at goal - Continues on high-dose generic rosuvastatin without side effects.  3. Obesity - Before last visit he lost 6 pounds, however, since last visit, his weight was fluctuating, but recently lost 5 pounds in the last month - at last visit, we stopped metformin as his sugars were great and he complained of lack of appetite, but will restart at this visit.  Carlus Pavlov, MD PhD [facility] Endocr[facility]

## 2017-07-27 ENCOUNTER — Ambulatory Visit (HOSPITAL_COMMUNITY): Payer: Medicare Other

## 2017-09-08 ENCOUNTER — Other Ambulatory Visit: Payer: Self-pay | Admitting: Family Medicine

## 2017-10-05 ENCOUNTER — Emergency Department (HOSPITAL_COMMUNITY)
Admission: EM | Admit: 2017-10-05 | Discharge: 2017-10-05 | Disposition: A | Discharge status: Home / Self Care | Payer: Medicare Other | Attending: Emergency Medicine | Admitting: Emergency Medicine

## 2017-10-05 ENCOUNTER — Emergency Department (HOSPITAL_COMMUNITY): Payer: Medicare Other

## 2017-10-05 ENCOUNTER — Encounter (HOSPITAL_COMMUNITY): Payer: Self-pay | Admitting: Emergency Medicine

## 2017-10-05 ENCOUNTER — Other Ambulatory Visit: Payer: Self-pay

## 2017-10-05 DIAGNOSIS — M67431 Ganglion, right wrist: Secondary | ICD-10-CM | POA: Diagnosis not present

## 2017-10-05 DIAGNOSIS — R109 Unspecified abdominal pain: Secondary | ICD-10-CM

## 2017-10-05 DIAGNOSIS — I129 Hypertensive chronic kidney disease with stage 1 through stage 4 chronic kidney disease, or unspecified chronic kidney disease: Secondary | ICD-10-CM | POA: Diagnosis not present

## 2017-10-05 DIAGNOSIS — Z7982 Long term (current) use of aspirin: Secondary | ICD-10-CM | POA: Diagnosis not present

## 2017-10-05 DIAGNOSIS — N182 Chronic kidney disease, stage 2 (mild): Secondary | ICD-10-CM | POA: Diagnosis not present

## 2017-10-05 DIAGNOSIS — R1084 Generalized abdominal pain: Secondary | ICD-10-CM | POA: Diagnosis not present

## 2017-10-05 DIAGNOSIS — Z79899 Other long term (current) drug therapy: Secondary | ICD-10-CM | POA: Diagnosis not present

## 2017-10-05 DIAGNOSIS — E785 Hyperlipidemia, unspecified: Secondary | ICD-10-CM | POA: Diagnosis not present

## 2017-10-05 DIAGNOSIS — Z7984 Long term (current) use of oral hypoglycemic drugs: Secondary | ICD-10-CM | POA: Insufficient documentation

## 2017-10-05 DIAGNOSIS — E1122 Type 2 diabetes mellitus with diabetic chronic kidney disease: Secondary | ICD-10-CM | POA: Diagnosis not present

## 2017-10-05 DIAGNOSIS — I2510 Atherosclerotic heart disease of native coronary artery without angina pectoris: Secondary | ICD-10-CM | POA: Insufficient documentation

## 2017-10-05 DIAGNOSIS — M545 Low back pain, unspecified: Secondary | ICD-10-CM

## 2017-10-05 DIAGNOSIS — K5790 Diverticulosis of intestine, part unspecified, without perforation or abscess without bleeding: Secondary | ICD-10-CM | POA: Diagnosis not present

## 2017-10-05 DIAGNOSIS — M6740 Ganglion, unspecified site: Secondary | ICD-10-CM

## 2017-10-05 DIAGNOSIS — Z87891 Personal history of nicotine dependence: Secondary | ICD-10-CM | POA: Diagnosis not present

## 2017-10-05 LAB — CBC
HCT: 37.9 % — ABNORMAL LOW (ref 39.0–52.0)
HEMOGLOBIN: 12.6 g/dL — AB (ref 13.0–17.0)
MCH: 28.1 pg (ref 26.0–34.0)
MCHC: 33.2 g/dL (ref 30.0–36.0)
MCV: 84.6 fL (ref 78.0–100.0)
PLATELETS: 261 10*3/uL (ref 150–400)
RBC: 4.48 MIL/uL (ref 4.22–5.81)
RDW: 14.4 % (ref 11.5–15.5)
WBC: 7.8 10*3/uL (ref 4.0–10.5)

## 2017-10-05 LAB — BASIC METABOLIC PANEL
ANION GAP: 10 (ref 5–15)
BUN: 20 mg/dL (ref 8–23)
CHLORIDE: 106 mmol/L (ref 98–111)
CO2: 26 mmol/L (ref 22–32)
Calcium: 9.5 mg/dL (ref 8.9–10.3)
Creatinine, Ser: 1.68 mg/dL — ABNORMAL HIGH (ref 0.61–1.24)
GFR calc Af Amer: 45 mL/min — ABNORMAL LOW (ref >60)
GFR, EST NON AFRICAN AMERICAN: 39 mL/min — AB (ref >60)
GLUCOSE: 161 mg/dL — AB (ref 70–99)
POTASSIUM: 3.8 mmol/L (ref 3.5–5.1)
Sodium: 142 mmol/L (ref 135–145)

## 2017-10-05 LAB — URINALYSIS, ROUTINE W REFLEX MICROSCOPIC
BACTERIA UA: NONE SEEN
BILIRUBIN URINE: NEGATIVE
Glucose, UA: NEGATIVE mg/dL
Hgb urine dipstick: NEGATIVE
Ketones, ur: NEGATIVE mg/dL
Leukocytes, UA: NEGATIVE
Nitrite: NEGATIVE
Protein, ur: 100 mg/dL — AB
SPECIFIC GRAVITY, URINE: 1.01 (ref 1.005–1.030)
pH: 5 (ref 5.0–8.0)

## 2017-10-05 MED ORDER — LIDOCAINE 5 % EX PTCH
1.0000 | MEDICATED_PATCH | CUTANEOUS | Status: DC
  Administered 2017-10-05: 1 via TRANSDERMAL
  Filled 2017-10-05: qty 1

## 2017-10-05 NOTE — ED Provider Notes (Signed)
[facility] EMERGENCY DEPARTMENT Provider Note   CSN: [id number] Arrival date & time: 10/05/17  1135     History   Chief Complaint Chief Complaint  Patient presents with  . Flank Pain  . Wrist Pain    HPI James Jones is a 72 y.o. male.  HPI  Patient is a 72 year old male with a history of CAD, CKD, type 2 diabetes mellitus, hyperlipidemia, hypertension, second-degree AV block presenting for right flank and right-sided abdominal pain.  Patient reports that he is asymptomatic at rest, and the pain only comes when he is sitting.  Patient reports that it was tied to an episode of getting out of his car 5 days ago, and he felt sudden sharp pain within his right side and flank.  Patient denies any trauma or falls.  Patient denies any dysuria, urgency, frequency, or hematuria.  Patient has history of hypertension.  Patient reports he is a former tobacco smoker. No remedies tried for symptoms.   Patient is also noting that he began having a "bulge" on the volar side of his right wrist.  Patient reports that he has noticed it becoming more prominent, but denies significant discomfort from it.  Patient denies any erythema or drainage from this area.  Patient denies any weakness or numbness in his distal fingers of his right hand.  Past Medical History:  Diagnosis Date  . 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 CAD (coronary artery disease), with 60-70% stenosis in RCA 03/18/2012   FFR 0.84; EF 60-65%  . 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 Diabetes mellitus without complication (HCC)   . History of GI bleed January 2015   No obvious findings on EGD/colonoscopy  . Hyperlipidemia   . Hypertension   . OSA (obstructive sleep apnea) 03/18/2012   Doing better with CPAP  . Pulmonary hypertension (HCC) 12/10/2010   ECHO:  Mild PH,mild LVH; PA pressures estimated 30-40 mmHg  . 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 Heartburn 03/24/2016  . OSA on CPAP 08/02/2015  . Murmur, cardiac 03/20/2015  . Controlled diabetes mellitus with circulatory complication, without long-term current use of insulin (HCC) 04/29/2014  . Acute blood loss anemia 02/13/2013  . Dyslipidemia, goal LDL below 70 10/15/2012  . Overweight (BMI 25.0-29.9) 10/02/2012  . OSA (obstructive sleep apnea), has been cpap intolerant 03/18/2012  . 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 CAD (coronary artery disease), with 60-70% stenosis in RCA 03/18/2012  . Essential hypertension 03/17/2012  . RBBB 03/17/2012    Past Surgical History:  Procedure Laterality Date  . APPENDECTOMY  1974  . CARDIAC CATHETERIZATION  03/27/2010   Moderate mid RCA lesion,right radial approach,normal EF  . CARPAL TUNNEL RELEASE    . EYE SURGERY  1960   Left eye  . HEMORRHOID SURGERY    . LEFT HEART CATHETERIZATION WITH CORONARY ANGIOGRAM N/A 03/17/2012   Procedure: LEFT HEART CATHETERIZATION WITH CORONARY ANGIOGRAM;  Surgeon: Marykay Lex, MD;  Location: [facility] CATH LAB;  Service: Cardiovascular: Fractional Flow Reserve Measurement of mid RCA 60-70% stenosis = 0.84. Otherwise mild LCA CAD.  Normal EF & EDP  . SHOULDER ARTHROSCOPY  2006        Home Medications    Prior to Admission medications  Medication Sig Start Date End Date Taking? Authorizing Provider  aspirin EC 81 MG tablet Take 81 mg by mouth every other day.    [provider]  Cholecalciferol (VITAMIN D-3) 1000 UNITS CAPS Take by mouth daily. 2 capsules.    [provider]  COLCRYS 0.6 MG tablet TAKE 1 TABLET BY MOUTH 2 TIMES DAILY AS NEEDED 04/05/17   Donita Brooks, MD  ferrous sulfate 325 (65 FE) MG tablet Take 1 tablet (325 mg total) by mouth 2 (two) times daily with a meal. 02/14/13   Christiane Ha,  MD  fish oil-omega-3 fatty acids 1000 MG capsule Take 1 g by mouth daily.    [provider]  glucose blood (FREESTYLE TEST STRIPS) test strip Use to test blood sugar 2 times daily as instructed. Dx: E11.59 07/21/17   Carlus Pavlov, MD  metFORMIN (GLUCOPHAGE-XR) 500 MG 24 hr tablet Take 1 tablet (500 mg total) by mouth daily with breakfast. 07/21/17   Carlus Pavlov, MD  Multiple Vitamin (MULTIVITAMIN WITH MINERALS) TABS Take 1 tablet by mouth daily.    [provider]  pantoprazole (PROTONIX) 40 MG tablet Take 1 tablet by mouth daily. 08/09/16   [provider]  Potassium 99 MG TABS Take 1 tablet by mouth daily.    [provider]  PRESCRIPTION MEDICATION Uses C-PAP at bedtime    [provider]  rosuvastatin (CRESTOR) 40 MG tablet TAKE 1 TABLET BY MOUTH EVERY DAY 09/08/17   Donita Brooks, MD  valsartan-hydrochlorothiazide (DIOVAN-HCT) 320-25 MG tablet TAKE 1 TABLET EVERY DAY 10/21/16   Carlus Pavlov, MD    Family History Family History  Problem Relation Age of Onset  . Kidney failure Mother   . Coronary artery disease Father   . Kidney failure Sister     Social History Social History   Tobacco Use  . Smoking status: Former Smoker    Packs/day: 1.00    Types: Cigarettes    Last attempt to quit: 04/12/2012    Years since quitting: 5.4  . Smokeless tobacco: Never Used  Substance Use Topics  . Alcohol use: Yes    Alcohol/week: 1.0 standard drinks    Types: 1 Standard drinks or equivalent per week  . Drug use: Yes    Types: Marijuana    Comment: cannibus     Allergies   Patient has no known allergies.   Review of Systems Review of Systems  Constitutional: Negative for chills and fever.  HENT: Negative for congestion and sore throat.   Eyes: Negative for visual disturbance.  Respiratory: Negative for cough, chest tightness and shortness of breath.   Cardiovascular: Negative for chest pain and leg swelling.    Gastrointestinal: Negative for abdominal pain, nausea and vomiting.  Genitourinary: Positive for flank pain. Negative for dysuria, frequency, hematuria and urgency.  Musculoskeletal: Positive for back pain. Negative for myalgias.  Skin: Negative for rash.  Neurological: Negative for dizziness, syncope, light-headedness and headaches.     Physical Exam Updated Vital Signs BP (!) 142/81 (BP Location: Right Arm)   Pulse 72   Temp 97.6 F (36.4 C) (Oral)   Resp 16   Ht 5\' 7"  (1.702 m)   Wt 86.2 kg   SpO2 98%   BMI 29.76 kg/m   Physical Exam  Constitutional: He appears well-developed and well-nourished. No distress.  HENT:  Head: Normocephalic and atraumatic.  Mouth/Throat: Oropharynx is clear and moist.  Eyes: Pupils are equal, round, and reactive to light. Conjunctivae and  EOM are normal.  Neck: Normal range of motion. Neck supple.  Cardiovascular: Normal rate, regular rhythm, S1 normal and S2 normal.  No murmur heard. Pulses:      Radial pulses are 2+ on the right side, and 2+ on the left side.       Dorsalis pedis pulses are 2+ on the right side, and 2+ on the left side.  No lower extremity edema.  No calf tenderness.  Pulmonary/Chest: Effort normal and breath sounds normal. He has no wheezes. He has no rales.  Abdominal: Soft. Bowel sounds are normal. He exhibits no distension. There is tenderness. There is no guarding.  Rectus diastasis present. No pulsatile mass. Patient has tenderness to palpation of right side of rectus abdominis musculature when flexing abdomen. Resolves when not flexing abdomen.  No guarding or rebound.  Musculoskeletal: Normal range of motion. He exhibits no edema or deformity.  Lymphadenopathy:    He has no cervical adenopathy.  Neurological: He is alert.  Spine Exam: Inspection/Palpation: Patient with no midline tenderness palpation of lumbar spine.  Patient has right-sided paraspinal muscle nuclear tenderness, particularly superior to iliac  crest. Strength: 5/5 throughout LE bilaterally (hip flexion/extension, adduction/abduction; knee flexion/extension; foot dorsiflexion/plantarflexion, inversion/eversion; great toe inversion) Sensation: Intact to light touch in proximal and distal LE bilaterally   Skin: Skin is warm and dry. No rash noted. No erythema.  Psychiatric: He has a normal mood and affect. His behavior is normal. Judgment and thought content normal.  Nursing note and vitals reviewed.    ED Treatments / Results  Labs (all labs ordered are listed, but only abnormal results are displayed) Labs Reviewed  URINALYSIS, ROUTINE W REFLEX MICROSCOPIC - Abnormal; Notable for the following components:      Result Value   Protein, ur 100 (*)    All other components within normal limits  CBC - Abnormal; Notable for the following components:   Hemoglobin 12.6 (*)    HCT 37.9 (*)    All other components within normal limits  BASIC METABOLIC PANEL - Abnormal; Notable for the following components:   Glucose, Bld 161 (*)    Creatinine, Ser 1.68 (*)    GFR calc non Af Amer 39 (*)    GFR calc Af Amer 45 (*)    All other components within normal limits    EKG None  Radiology No results found.  Procedures Procedures (including critical care time)  Medications Ordered in ED Medications  lidocaine (LIDODERM) 5 % 1 patch (1 patch Transdermal Patch Applied 10/05/17 1411)     Initial Impression / Assessment and Plan / ED Course  I have reviewed the triage vital signs and the nursing notes.  Pertinent labs & imaging results that were available during my care of the patient were reviewed by me and considered in my medical decision making (see chart for details).  Clinical Course as of Oct 06 1822  Wed Oct 05, 2017  1603 Consistent with prior. Pt has CKD.  Creatinine(!): 1.68 [AM]    Clinical Course User Index [AM] Elisha PonderMurray, Alyssa B, PA-C    Patient is nontoxic-appearing, afebrile, and with benign abdomen.   Differential diagnosis includes nephrolithiasis, pyelonephritis, muscular skeletal pain of abdominal wall, AAA.  Patient's prior imaging shows no evidence of AAA.  Additionally, patient is equal pulses in all extremities, and has no persistent abdominal pain.  Abdominal exam and history are atypical for visceral pathology.  Work-up demonstrates stable CKD, normal electrolytes, no leukocytosis and urinalysis without evidence of  hematuria or infection.  Patient's renal stone study and CT L-spine no charge demonstrating stable degenerative changes and spinal stenosis since 2017 MRI, moderate stool burden, and extensive diverticulosis without diverticulitis, but no acute inflammation.  No evidence of aneurysm, either aortic or renal.  Discussed pathology with patient, and gave return precautions for any worsening pain, persistent pain, intractable nausea vomiting, or fever chills.  Additionally, discussed the etiology and natural course of ganglion cyst.  Patient was given referral to hand surgery for further management should patient want procedural intervention.  This is a shared visit with Dr. Cathren Laine. Patient was independently evaluated by this attending physician. Attending physician consulted in evaluation and discharge management.  Final Clinical Impressions(s) / ED Diagnoses   Final diagnoses:  Low back pain  Right flank pain  Ganglion cyst    ED Discharge Orders    None       Delia Chimes 10/05/17 1827    Cathren Laine, MD 10/06/17 [phone]

## 2017-10-05 NOTE — ED Triage Notes (Signed)
Onset 2 days ago right flank pain denies urinary complaints. Pain currently 10/10 sharp. Onset 2 days ago also developed right wrist pain while getting up from a chair. Pain currently 7/10 achy sore..Marland Kitchen

## 2017-10-05 NOTE — ED Notes (Signed)
Patient verbalizes understanding of discharge instructions. Opportunity for questioning and answers were provided. Armband removed by staff, pt discharged from ED.  

## 2017-10-05 NOTE — Discharge Instructions (Addendum)
Please see the information and instructions below regarding your visit.  Your diagnoses today include:  1. Right flank pain   2. Low back pain   3. Ganglion cyst    Tests performed today include: See side panel of your discharge paperwork for testing performed today. Vital signs are listed at the bottom of these instructions.   Your CT scans were normal today.  No evidence of a fracture in the back that is causing the pain, or any abnormalities of your kidneys or colon.  Your kidney function was stable today.  Medications prescribed:    Take any prescribed medications only as prescribed, and any over the counter medications only as directed on the packaging.  Please take Tylenol, 650 mg every 6 hours as needed for pain.  Please do not exceed 4 g in 1 day.  Please apply Salon PAS patches to the low back and right abdomen.   Home care instructions:  Please follow any educational materials contained in this packet.   Do not lift anything greater than 25 pounds for 1 week.  Follow-up instructions: Please follow-up with your primary care provider in one week for further evaluation of your symptoms if they are not completely improved.   Please follow-up with Dr. Amanda PeaGramig of hand surgery to discuss removal of your cyst.   Return instructions:  Please return to the Emergency Department if you experience worsening symptoms.  Please return to the emergency department if you have any worsening pain, nausea or vomiting the prevention keep anything down, weakness or numbness in your lower extremities, or any fever or chills. Please return if you have any other emergent concerns.  Additional Information:   Your vital signs today were: BP (!) 142/81 (BP Location: Right Arm)    Pulse 72    Temp 97.6 F (36.4 C) (Oral)    Resp 16    Ht 5\' 7"  (1.702 m)    Wt 86.2 kg    SpO2 98%    BMI 29.76 kg/m  If your blood pressure (BP) was elevated on multiple readings during this visit above 130 for the  top number or above 80 for the bottom number, please have this repeated by your primary care provider within one month. --------------  Thank you for allowing us to participate in your care today.

## 2017-10-14 ENCOUNTER — Ambulatory Visit: Payer: Medicare Other | Admitting: Family Medicine

## 2017-11-14 ENCOUNTER — Telehealth: Payer: Self-pay | Admitting: Internal Medicine

## 2017-11-14 DIAGNOSIS — Z79899 Other long term (current) drug therapy: Secondary | ICD-10-CM | POA: Diagnosis not present

## 2017-11-14 DIAGNOSIS — E559 Vitamin D deficiency, unspecified: Secondary | ICD-10-CM | POA: Diagnosis not present

## 2017-11-14 DIAGNOSIS — I10 Essential (primary) hypertension: Secondary | ICD-10-CM | POA: Diagnosis not present

## 2017-11-14 DIAGNOSIS — R809 Proteinuria, unspecified: Secondary | ICD-10-CM | POA: Diagnosis not present

## 2017-11-14 DIAGNOSIS — D509 Iron deficiency anemia, unspecified: Secondary | ICD-10-CM | POA: Diagnosis not present

## 2017-11-14 DIAGNOSIS — N183 Chronic kidney disease, stage 3 (moderate): Secondary | ICD-10-CM | POA: Diagnosis not present

## 2017-11-14 NOTE — Telephone Encounter (Signed)
Called patient to ask him what meter he uses.

## 2017-11-14 NOTE — Telephone Encounter (Signed)
Patient is stating they are in need of a glucose meter. He believes pharmacy needs RX. Patient is saying that pharmacy told them they called [place] last week and there is no record. Please Advise. Ph # [phone] [id number] [place], [place] - [address]

## 2017-11-15 ENCOUNTER — Other Ambulatory Visit: Payer: Self-pay | Admitting: Internal Medicine

## 2017-11-15 MED ORDER — FREESTYLE LITE DEVI: 0 refills | Status: DC

## 2017-11-15 MED ORDER — GLUCOSE BLOOD VI STRP: ORAL_STRIP | 12 refills | Status: DC

## 2017-11-15 NOTE — Addendum Note (Signed)
Addended by: Darliss Ridgel I on: 11/15/2017 09:48 AM   Modules accepted: Orders

## 2017-11-15 NOTE — Telephone Encounter (Signed)
OK. Please get the Dx code from chart.

## 2017-11-15 NOTE — Telephone Encounter (Signed)
Patient called back, he is using Freestyle lite.  New meter and strips sent to pharmacy.

## 2017-11-15 NOTE — Telephone Encounter (Signed)
Please advise 

## 2017-11-16 ENCOUNTER — Other Ambulatory Visit: Payer: Self-pay | Admitting: Internal Medicine

## 2017-11-17 ENCOUNTER — Telehealth: Payer: Self-pay | Admitting: Internal Medicine

## 2017-11-17 NOTE — Telephone Encounter (Signed)
Pt stated that the pharmacy has not received the blood glucose monitor(reader). He did get the test strips though. Please resend

## 2017-11-18 ENCOUNTER — Other Ambulatory Visit: Payer: Self-pay

## 2017-11-18 ENCOUNTER — Telehealth: Payer: Self-pay | Admitting: Internal Medicine

## 2017-11-18 MED ORDER — FREESTYLE LITE DEVI: 0 refills | Status: AC

## 2017-11-18 NOTE — Telephone Encounter (Signed)
New one sent with medicare dx code.

## 2017-11-18 NOTE — Telephone Encounter (Signed)
Pharmacy stated we need to resubmit a whole new RX and need the diagnose code.   FreeStyle Lite Meter

## 2017-11-18 NOTE — Telephone Encounter (Signed)
New one sent with medicare dx code. 

## 2017-11-18 NOTE — Telephone Encounter (Signed)
Pt calling and stated he still don't have his meter because the pharmacy sent script back because we didn't have the code they needed for insurance purposes. Pt was very upset because he hasn't checked his sugar in a while

## 2017-11-24 ENCOUNTER — Encounter: Payer: Self-pay | Admitting: Internal Medicine

## 2017-11-24 ENCOUNTER — Ambulatory Visit (INDEPENDENT_AMBULATORY_CARE_PROVIDER_SITE_OTHER): Payer: Medicare Other | Admitting: Internal Medicine

## 2017-11-24 VITALS — BP 140/78 | HR 70 | Ht 68.0 in | Wt 195.0 lb

## 2017-11-24 DIAGNOSIS — I2510 Atherosclerotic heart disease of native coronary artery without angina pectoris: Secondary | ICD-10-CM

## 2017-11-24 DIAGNOSIS — E1159 Type 2 diabetes mellitus with other circulatory complications: Secondary | ICD-10-CM

## 2017-11-24 DIAGNOSIS — Z23 Encounter for immunization: Secondary | ICD-10-CM

## 2017-11-24 DIAGNOSIS — E785 Hyperlipidemia, unspecified: Secondary | ICD-10-CM | POA: Diagnosis not present

## 2017-11-24 DIAGNOSIS — E663 Overweight: Secondary | ICD-10-CM | POA: Diagnosis not present

## 2017-11-24 LAB — POCT GLYCOSYLATED HEMOGLOBIN (HGB A1C): Hemoglobin A1C: 6.2 % — AB (ref 4.0–5.6)

## 2017-11-24 NOTE — Patient Instructions (Signed)
Please restart Metformin ER 500 mg with breakfast.  Please return in 6 months with your sugar log.   Your HbA1c is 6.2%!

## 2017-11-24 NOTE — Progress Notes (Signed)
Patient ID: James Jones, male   DOB: Oct 19, 1945, 72 y.o.   MRN: [id number]  HPI: James Jones is a 72 y.o.-year-old male, returning for f/u for DM2, dx in ~2005, non-insulin-dependent, lately more controlled, with complications (CAD, CKD). Last visit 4 months ago. PCP: Dr. Lynnea Ferrier  Last hemoglobin A1c was: Lab Results  Component Value Date   HGBA1C 6.9 (A) 07/21/2017   HGBA1C 6.3 (H) 05/04/2017   HGBA1C 6.2 02/15/2017  04/11/2014: HbA1c 8.4% 01/08/2014: HbA1c 7.7% He had steroid inj for gout in his elbows - 12/2013. He also had a steroid inj in knee fall 2016, too.   He is now on: - Metformin ER 500 mg daily -restarted 07/2017  He was previously on Januvia. We stopped Glipizide ER 2.5 mg daily in 07/2015. Had nausea, vomiting, loss of appetite with regular metformin.  He was on Actos. He was on insulin before (Lantus) - came off years ago.  Pt checks his sugars 1-3 times a day per review of his log: - am:72-110 >> 81-106 >> 101-141, 168 >> 87-116, 154 - 2h after b'fast: 68, 71-156, 161 >> 107 >> n/c - before lunch: 54 x1, 80, 133 >> 96-110 >> 75, 99 - 2h after lunch: 61 -120 >> 107, 118 >> n/c - before dinner: 71-135 >> 72, 82, 168 >> 74-114 - 2h after dinner:  84-147 >> 110-171 >> 102 - bedtime: 123, 146 >> 89-135 >> n/c - nighttime: 89 >> 112 >> n/c >> 100 >> n/c  Lowest 54 x1 >> 72 >> 74; he has hypoglycemia awareness in the 70s. Highest sugar was 161 >> 171 >> 154.  Glucometer: Freestyle [employer] did not cover Freestyle Libre CGM.  Pt's meals are: - Breakfast: bacon + eggs, sausage, grits, oatmeal, cereal, toast wheat - Lunch: BLT, soups, fruit, nuts - Dinner: chicken, pork + greens, rice  - Snacks: 1 a day: pretzels; carrots He is still exercising 3 times a week (walking 33-month the [employer])  -+ CKD-seeing nephrology (had an U/S >> renal cysts, otherwise normal), reviewed latest BUN/creatinine:  Lab Results  Component Value Date   BUN 20 10/05/2017   CREATININE 1.68 (H) 10/05/2017  05/24/2014: 34/1.64 01/08/2014: 18/1.15 On valsartan. -+ HL; last set of lipids: Lab Results  Component Value Date   CHOL 206 (H) 05/04/2017   HDL 92 05/04/2017   LDLCALC 97 05/04/2017   TRIG 76 05/04/2017   CHOLHDL 2.2 05/04/2017  04/11/2014: 206/143/70/106 01/08/2014: 161/98/60/73 On rosuvastatin 40. - last eye exam was in 02/2017: No DR.  He has history of cataract surgery in 2016 and 17. -No numbness and tingling in his feet.  Last TSH normal: Lab Results  Component Value Date   TSH 1.82 04/08/2016   He has a history of hand itching, relieved by Zyrtec.  ROS: Constitutional: no weight gain/no weight loss, no fatigue, no subjective hyperthermia, no subjective hypothermia Eyes: no blurry vision, no xerophthalmia ENT: no sore throat, no nodules palpated in throat, no dysphagia, no odynophagia, no hoarseness Cardiovascular: no CP/no SOB/no palpitations/no leg swelling Respiratory: no cough/no SOB/no wheezing Gastrointestinal: no N/no V/no D/no C/no acid reflux Musculoskeletal: no muscle aches/no joint aches Skin: no rashes, no hair loss Neurological: no tremors/no numbness/no tingling/no dizziness  I reviewed pt's medications, allergies, PMH, social hx, family hx, and changes were documented in the history of present illness. Otherwise, unchanged from my initial visit note.  Past Medical History:  Diagnosis Date  . 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 CAD (  coronary artery disease), with 60-70% stenosis in RCA 03/18/2012   FFR 0.84; EF 60-65%  . 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 Diabetes mellitus without complication (HCC)   . History of GI bleed January 2015   No obvious findings on EGD/colonoscopy  . Hyperlipidemia   . Hypertension   . OSA (obstructive sleep apnea) 03/18/2012   Doing better with CPAP  . Pulmonary hypertension (HCC) 12/10/2010   ECHO:  Mild PH,mild LVH; PA pressures estimated  30-40 mmHg  . 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 Wenckebach 04/09/2012-05/09/2012   Event monitor; usually during sleeping hours; therefore not on beta blocker  + Gout  Past Surgical History:  Procedure Laterality Date  . APPENDECTOMY  1974  . CARDIAC CATHETERIZATION  03/27/2010   Moderate mid RCA lesion,right radial approach,normal EF  . CARPAL TUNNEL RELEASE    . EYE SURGERY  1960   Left eye  . HEMORRHOID SURGERY    . LEFT HEART CATHETERIZATION WITH CORONARY ANGIOGRAM N/A 03/17/2012   Procedure: LEFT HEART CATHETERIZATION WITH CORONARY ANGIOGRAM;  Surgeon: Marykay Lex, MD;  Location: [facility] CATH LAB;  Service: Cardiovascular: Fractional Flow Reserve Measurement of mid RCA 60-70% stenosis = 0.84. Otherwise mild LCA CAD.  Normal EF & EDP  . SHOULDER ARTHROSCOPY  2006   History   Social History  . Marital Status: Married    Spouse Name: N/A   Occupational History  . retired   Social History Main Topics  . Smoking status: Former Smoker -- 2.00 packs/day    Quit date: 04/12/2012  . Smokeless tobacco: Never Used  . Alcohol Use: 0.5 oz/week    1 drink(s) per week  . Drug Use: Yes    Special: Marijuana     Comment: cannibus   Social History Narrative   Married, father of 2, grandfather of 3.   He is a former smoker of about a pack to pack and half cigarettes a day -- he quit in March of this year.    He is an avid exerciser working at least 4-5 days a week doing her walking or stationary bike.   Current Outpatient Medications on File Prior to Visit  Medication Sig Dispense Refill  . Blood Glucose Monitoring Suppl (FREESTYLE LITE) DEVI Use to check blood sugar once daily 1 each 0  . Cholecalciferol (VITAMIN D-3) 1000 UNITS CAPS Take by mouth daily. 2 capsules.    . COLCRYS 0.6 MG tablet TAKE 1 TABLET BY MOUTH 2 TIMES DAILY AS NEEDED (Patient taking differently: Take 0.6 mg by mouth 2 (two) times daily as needed (gout flare up). ) 30 tablet 3  . ferrous sulfate 325 (65 FE) MG tablet Take  1 tablet (325 mg total) by mouth 2 (two) times daily with a meal.  3  . fish oil-omega-3 fatty acids 1000 MG capsule Take 1 g by mouth daily.    Marland Kitchen glucose blood (FREESTYLE LITE) test strip USE TO CHECK BLOOD SUGAR ONCE DAILY. 100 each 12  . metFORMIN (GLUCOPHAGE-XR) 500 MG 24 hr tablet Take 1 tablet (500 mg total) by mouth daily with breakfast. 90 tablet 3  . Multiple Vitamin (MULTIVITAMIN WITH MINERALS) TABS Take 1 tablet by mouth daily.    . Potassium 99 MG TABS Take 99 mg by mouth daily.     Marland Kitchen PRESCRIPTION MEDICATION Uses C-PAP at bedtime    . rosuvastatin (CRESTOR) 40 MG tablet TAKE 1 TABLET BY MOUTH EVERY DAY (Patient taking differently: Take 40 mg  by mouth daily. ) 90 tablet 2  . valsartan-hydrochlorothiazide (DIOVAN-HCT) 320-25 MG tablet TAKE 1 TABLET EVERY DAY 90 tablet 3   No current facility-administered medications on file prior to visit.    No Known Allergies Family History  Problem Relation Age of Onset  . Kidney failure Mother   . Coronary artery disease Father   . Kidney failure Sister    PE: BP 140/78   Pulse 70   Ht 5\' 8"  (1.727 m) Comment: measured  Wt 195 lb (88.5 kg)   SpO2 97%   BMI 29.65 kg/m  Body mass index is 29.65 kg/m. Wt Readings from Last 3 Encounters:  11/24/17 195 lb (88.5 kg)  10/05/17 190 lb (86.2 kg)  07/21/17 196 lb 6.4 oz (89.1 kg)   Constitutional: overweight, in NAD Eyes: PERRLA, EOMI, no exophthalmos ENT: moist mucous membranes, no thyromegaly,  no cervical lymphadenopathy Cardiovascular: RRR, No MRG Respiratory: CTA B Gastrointestinal: abdomen soft, NT, ND, BS+ Musculoskeletal: no deformities, strength intact in all 4 Skin: moist, warm, no rashes Neurological: no tremor with outstretched hands, DTR normal in all 4  ASSESSMENT: 1. DM2, non-insulin-dependent, uncontrolled, with complications - CAD - cardiologist Dr. Herbie Baltimore - CKD - started to see Dr Kristian Covey  2. HL  3. Overweight  PLAN:  1. Patient with long-standing,  previously uncontrolled diabetes, with much improved control in the last year.  He was previously on glipizide ER which we stopped in 2017.  She was also on Januvia in the past when he had to come off metformin due to decreased GFR.  He is now off Januvia -At last visit, sugars were still at goal, but higher.  He had occasional hyperglycemic spikes.  And HbA1c was 6.9% (higher).  We restarted low-dose metformin ER.  Sugars improved since then and they are all at goal now, with the exception of a CBG of 154 apparently checked 1 hour after a meal. -No medication changes are needed for now -  I suggested to:  Patient Instructions  Continue metformin ER 500 mg with breakfast  Please return in 6 months with your sugar log.   Your HbA1c is 6.2%!  - today, HbA1c is 6.2% (improved) - continue checking sugars at different times of the day - check 1x a day, rotating checks - advised for yearly eye exams >> he is UTD - Given flu shot today - Return to clinic in 6 mo with sugar log     2. HL - Reviewed latest lipid panel from 04/2017: Total cholesterol high, the rest of the fractions at goal Lab Results  Component Value Date   CHOL 206 (H) 05/04/2017   HDL 92 05/04/2017   LDLCALC 97 05/04/2017   TRIG 76 05/04/2017   CHOLHDL 2.2 05/04/2017  - Continues high-dose rosuvastatin without side effects.  3. Obesity -At last visit we restarted metformin, which can help reducing appetite and stabilizing weight long-term -Weight is stable since last visit  Carlus Pavlov, MD PhD [facility] Endocrinology

## 2017-12-05 DIAGNOSIS — E1129 Type 2 diabetes mellitus with other diabetic kidney complication: Secondary | ICD-10-CM | POA: Diagnosis not present

## 2017-12-05 DIAGNOSIS — N183 Chronic kidney disease, stage 3 (moderate): Secondary | ICD-10-CM | POA: Diagnosis not present

## 2017-12-05 DIAGNOSIS — D508 Other iron deficiency anemias: Secondary | ICD-10-CM | POA: Diagnosis not present

## 2017-12-05 DIAGNOSIS — I10 Essential (primary) hypertension: Secondary | ICD-10-CM | POA: Diagnosis not present

## 2017-12-20 ENCOUNTER — Ambulatory Visit: Payer: Medicare Other | Admitting: Nurse Practitioner

## 2018-03-01 DIAGNOSIS — Z961 Presence of intraocular lens: Secondary | ICD-10-CM | POA: Diagnosis not present

## 2018-03-01 DIAGNOSIS — H524 Presbyopia: Secondary | ICD-10-CM | POA: Diagnosis not present

## 2018-03-01 DIAGNOSIS — E119 Type 2 diabetes mellitus without complications: Secondary | ICD-10-CM | POA: Diagnosis not present

## 2018-03-01 DIAGNOSIS — H5211 Myopia, right eye: Secondary | ICD-10-CM | POA: Diagnosis not present

## 2018-03-01 LAB — HM DIABETES EYE EXAM

## 2018-03-19 ENCOUNTER — Encounter: Payer: Self-pay | Admitting: Cardiology

## 2018-03-19 NOTE — Progress Notes (Signed)
PCP: Donita Brooks, MD  Clinic Note: Chief Complaint  Patient presents with  . Follow-up    Doing well.  No complaint    HPI: James Jones is a 73 y.o. male with PMH notable for NON-OCCLUSIVE SINGLE VESSEL CAD & significant CRFs (DM-2, HTN & HLD) with a soft MURMUR who presents for annual follow-up.   CATH 03/2012: 60-70% RCA lesion.  FFR 0.84.  James Jones was last seen in February 2019.-was doing well at that time.  "Stated he felt like he was in his 22s again ".  Still doing his routine exercise.  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  Was taken off of his DM medicines.  No cardiac symptoms.  Recent Hospitalizations:   ER visit August 2019: Right flank and low back pain.  Studies Reviewed:   None  Interval History: James Jones returns today continuing to do well.  He actually just came from the gym.  He does 3 days a week in the gym and then walks on other days.  Is only for the bilateral over the last year was that his gout acted up and he went to the hospital in August.  He is now back on once a day metformin for his blood sugar control.  He does mention that he put back on some weight over the Christmas holidays he has now working to try to cut that down. Heartburn pretty well controlled. With all the activity he does, he denies any anginal chest pain or pressure with rest resistance.  No resting or exertional dyspnea.  Cardiac Review of Symptoms: No PND, orthopnea or edema.  No palpitations, lightheadedness, dizziness, weakness or syncope/near syncope. No TIA/amaurosis fugax symptoms. No claudication.  ROS: A comprehensive was performed. Review of Systems  Constitutional: Negative for malaise/fatigue and weight loss.  HENT: Negative for congestion and nosebleeds.   Respiratory: Negative for cough, sputum production and wheezing.   Gastrointestinal: Positive for heartburn (depends on what he eats - better with H2 blocker). Negative for constipation.  Genitourinary:  Negative for dysuria.  Musculoskeletal: Positive for joint pain. Negative for falls and myalgias.  Skin: Negative.   Neurological: Negative for dizziness and tremors.  Psychiatric/Behavioral: Negative for depression. The patient is not nervous/anxious and does not have insomnia.   All other systems reviewed and are negative.   Past Medical History:  Diagnosis Date  . 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 Coronary artery disease, non-occlusive 03/18/2012   60-70% stenosis in RCA -- FFR 0.84; EF 60-65%  . Diabetes mellitus without complication (HCC)   . GERD (gastroesophageal reflux disease)    On PPI  . History of GI bleed January 2015   No obvious findings on EGD/colonoscopy  . Hyperlipidemia   . Hypertension   . OSA (obstructive sleep apnea) 03/18/2012   Doing better with CPAP  . Pulmonary hypertension (HCC) 12/10/2010   ECHO:  Mild PH,mild LVH; PA pressures estimated 30-40 mmHg  . 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   Event monitor; usually during sleeping hours; therefore not on beta blocker     Past Surgical History:  Procedure Laterality Date  . APPENDECTOMY  1974  . CARPAL TUNNEL RELEASE    . EYE SURGERY  1960   Left eye  . HEMORRHOID SURGERY    . LEFT HEART CATH AND CORONARY ANGIOGRAPHY  03/27/2010   Moderate mid RCA lesion,right radial approach,normal EF  . LEFT HEART CATHETERIZATION WITH CORONARY ANGIOGRAM N/A 03/17/2012  Procedure: LEFT HEART CATHETERIZATION WITH CORONARY ANGIOGRAM;  Surgeon: Marykay Lex, MD;  Location: [facility] CATH LAB;  Service: Cardiovascular: Fractional Flow Reserve Measurement of mid RCA 60-70% stenosis = 0.84. Otherwise mild LCA CAD.  Normal EF & EDP  . SHOULDER ARTHROSCOPY  2006  . TRANSTHORACIC ECHOCARDIOGRAM  12/2010    Mild concentric LVH.  EF> 55%.  GR 1 DD.  Mild aortic sclerosis no stenosis.  PA pressures estimated 30-40 mmHg    Current Meds  Medication Sig  . Blood Glucose Monitoring  Suppl (FREESTYLE LITE) DEVI Use to check blood sugar once daily  . Cholecalciferol (VITAMIN D-3) 1000 UNITS CAPS Take by mouth daily. 2 capsules.  . COLCRYS 0.6 MG tablet TAKE 1 TABLET BY MOUTH 2 TIMES DAILY AS NEEDED (Patient taking differently: Take 0.6 mg by mouth 2 (two) times daily as needed (gout flare up). )  . ferrous sulfate 325 (65 FE) MG tablet Take 1 tablet (325 mg total) by mouth 2 (two) times daily with a meal.  . fish oil-omega-3 fatty acids 1000 MG capsule Take 1 g by mouth daily.  Marland Kitchen glucose blood (FREESTYLE LITE) test strip USE TO CHECK BLOOD SUGAR ONCE DAILY.  . metFORMIN (GLUCOPHAGE-XR) 500 MG 24 hr tablet Take 1 tablet (500 mg total) by mouth daily with breakfast.  . Multiple Vitamin (MULTIVITAMIN WITH MINERALS) TABS Take 1 tablet by mouth daily.  . Potassium 99 MG TABS Take 99 mg by mouth daily.   Marland Kitchen PRESCRIPTION MEDICATION Uses C-PAP at bedtime  . rosuvastatin (CRESTOR) 40 MG tablet TAKE 1 TABLET BY MOUTH EVERY DAY (Patient taking differently: Take 40 mg by mouth daily. )  . valsartan-hydrochlorothiazide (DIOVAN-HCT) 320-25 MG tablet TAKE 1 TABLET EVERY DAY    No Known Allergies  Social History   Tobacco Use  . Smoking status: Former Smoker    Packs/day: 1.00    Types: Cigarettes    Last attempt to quit: 04/12/2012    Years since quitting: 5.9  . Smokeless tobacco: Never Used  Substance Use Topics  . Alcohol use: Yes    Alcohol/week: 1.0 standard drinks    Types: 1 Standard drinks or equivalent per week  . Drug use: Yes    Types: Marijuana    Comment: cannibus   Social History   Social History Narrative   Married, father of 2, grandfather of 3.   He is a former smoker of about a pack to pack and half cigarettes a day -- he quit in March of this year.    He is an avid exerciser working at least 4-5 days a week doing her walking or stationary bike.    family history includes Coronary artery disease in his father; Kidney failure in his mother and sister.  Wt  Readings from Last 3 Encounters:  03/20/18 197 lb (89.4 kg)  11/24/17 195 lb (88.5 kg)  10/05/17 190 lb (86.2 kg)  03/2016: Wt 197 lb 3.2 oz (89.4 kg)   PHYSICAL EXAM BP (!) 146/72   Pulse (!) 59   Ht 5\' 9"  (1.753 m)   Wt 197 lb (89.4 kg)   BMI 29.09 kg/m   Physical Exam  Constitutional: He is oriented to person, place, and time. He appears well-developed and well-nourished. No distress.  HENT:  Head: Normocephalic and atraumatic.  Eyes:  Lazy eye  Neck: Normal range of motion. Neck supple. No hepatojugular reflux and no JVD present. Carotid bruit is not present.  Cardiovascular: Normal rate, regular rhythm, S1 normal and  normal pulses.  No extrasystoles are present. PMI is not displaced. Exam reveals no gallop and no friction rub.  Murmur heard.  Medium-pitched blowing holosystolic murmur is present with a grade of 1/6 at the apex. Split S2  Pulmonary/Chest: Effort normal and breath sounds normal. No respiratory distress. He has no wheezes.  Abdominal: Soft. Bowel sounds are normal. He exhibits no distension. There is no abdominal tenderness. There is no rebound.  Musculoskeletal: Normal range of motion.        General: No edema.  Neurological: He is alert and oriented to person, place, and time.  Psychiatric: He has a normal mood and affect. His behavior is normal. Judgment and thought content normal.  Vitals reviewed.   Adult ECG Report  Rate: 59 ;  Rhythm: sinus bradycardia and RBBB. Otherwise normal axis, intervals and durations; (no change from most recent EKG)  Narrative Interpretation: Stable EKG   Other studies Reviewed: Additional studies/ records that were reviewed today include:  Recent Labs:  Due to be checked by PCP  Lab Results  Component Value Date   CHOL 206 (H) 05/04/2017   HDL 92 05/04/2017   LDLCALC 97 05/04/2017   TRIG 76 05/04/2017   CHOLHDL 2.2 05/04/2017  LDL Calc 49  Lab Results  Component Value Date   CREATININE 1.68 (H) 10/05/2017   BUN 20  10/05/2017   NA 142 10/05/2017   K 3.8 10/05/2017   CL 106 10/05/2017   CO2 26 10/05/2017    Lab Results  Component Value Date   HGBA1C 6.2 (A) 11/24/2017   Lab Results  Component Value Date   CHOL 206 (H) 05/04/2017   HDL 92 05/04/2017   LDLCALC 97 05/04/2017   TRIG 76 05/04/2017   CHOLHDL 2.2 05/04/2017    ASSESSMENT / PLAN: Problem List Items Addressed This Visit    AV block, 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  Remains asymptomatic.  Monitor for SSx of syncope & weakness/fatigue.      Relevant Orders   EKG 12-Lead   CAD (coronary artery disease), with 60-70% stenosis in RCA (Chronic)    No angina.  Not on Beta Blocker b/c bradycardia & Wenkebach Block. On ASA  & statin.  No change for now.      Relevant Orders   EKG 12-Lead   Dyslipidemia, goal LDL below 70 (Chronic)    Goal LDL is < 70 - on last check 04/2017 was up to 97.  Due for recheck with PCP.  Is on 40 mg rosuvastatin -- may need to consider adding Zetia.      Essential hypertension (Chronic)    BP has usually been well controlled - @ home in the 120s-130s/70s.  Just came from the gym & has not taken AM BP med.   Monitor - if BP still up @ PCP office, may need to consider amlodipine.       Overweight (BMI 25.0-29.9)    Gained back some of the weigh that he lost. Working of loosing it back.      Senile calcific aortic valve sclerosis (Chronic)    Very soft murmur.  Would not recheck Echo unless murmur worsens.         Current medicines are reviewed at length with the patient today. (+/- concerns) n/a The following changes have been made: n/a  Patient Instructions  Medication Instructions:  NOT NEEDED If you need a refill on your cardiac  medications before your next appointment, please call your pharmacy.   Lab work: NOT NEEDED If you have labs (blood work) drawn today and your tests are completely normal, you will receive your  results only by: Marland Kitchen. MyChart Message (if you have MyChart) OR . A paper copy in the mail If you have any lab test that is abnormal or we need to change your treatment, we will call you to review the results.  Testing/Procedures: NOT NEEDED  Follow-Up: At [facility], you and your health needs are our priority.  As part of our continuing mission to provide you with exceptional heart care, we have created designated Provider Care Teams.  These Care Teams include your primary Cardiologist (physician) and Advanced Practice Providers (APPs -  Physician Assistants and Nurse Practitioners) who all work together to provide you with the care you need, when you need it. You will need a follow up appointment in 12 months feb 2021.  Please call our office 2 months in advance to schedule this appointment.  You may see Bryan Lemmaavid , MD  or one of the following Advanced Practice Providers on your designated Care Team:   Theodore DemarkRhonda Barrett, PA-C . Joni ReiningKathryn Lawrence, DNP, ANP  Any Other Special Instructions Will Be Listed Below (If Applicable). Please have primary to send copy of labs in 2 months from now.   Please keep a log of blood pressure -if blood pressure continues to  Be elevated over 140's ( top number ) Contact office      Studies Ordered:   Orders Placed This Encounter  Procedures  . EKG 12-Lead      Bryan Lemmaavid , M.D., M.S. Interventional Cardiologist   Pager # [phone] Phone # [phone] [address] [place], [place] [address]

## 2018-03-20 ENCOUNTER — Encounter: Payer: Self-pay | Admitting: Cardiology

## 2018-03-20 ENCOUNTER — Ambulatory Visit (INDEPENDENT_AMBULATORY_CARE_PROVIDER_SITE_OTHER): Payer: Medicare Other | Admitting: Cardiology

## 2018-03-20 VITALS — BP 146/72 | HR 59 | Ht 69.0 in | Wt 197.0 lb

## 2018-03-20 DIAGNOSIS — I441 Atrioventricular block, second degree: Secondary | ICD-10-CM | POA: Diagnosis not present

## 2018-03-20 DIAGNOSIS — E663 Overweight: Secondary | ICD-10-CM | POA: Diagnosis not present

## 2018-03-20 DIAGNOSIS — E785 Hyperlipidemia, unspecified: Secondary | ICD-10-CM | POA: Diagnosis not present

## 2018-03-20 DIAGNOSIS — I2510 Atherosclerotic heart disease of native coronary artery without angina pectoris: Secondary | ICD-10-CM | POA: Diagnosis not present

## 2018-03-20 DIAGNOSIS — I350 Nonrheumatic aortic (valve) stenosis: Secondary | ICD-10-CM | POA: Diagnosis not present

## 2018-03-20 DIAGNOSIS — I10 Essential (primary) hypertension: Secondary | ICD-10-CM | POA: Diagnosis not present

## 2018-03-20 NOTE — Assessment & Plan Note (Signed)
Very soft murmur.  Would not recheck Echo unless murmur worsens.

## 2018-03-20 NOTE — Assessment & Plan Note (Signed)
No angina.  Not on Beta Blocker b/c bradycardia & Wenkebach Block. On ASA  & statin.  No change for now.

## 2018-03-20 NOTE — Assessment & Plan Note (Signed)
Gained back some of the weigh that he lost. Working of loosing it back.

## 2018-03-20 NOTE — Patient Instructions (Addendum)
Medication Instructions:  NOT NEEDED If you need a refill on your cardiac medications before your next appointment, please call your pharmacy.   Lab work: NOT NEEDED If you have labs (blood work) drawn today and your tests are completely normal, you will receive your results only by: Marland Kitchen MyChart Message (if you have MyChart) OR . A paper copy in the mail If you have any lab test that is abnormal or we need to change your treatment, we will call you to review the results.  Testing/Procedures: NOT NEEDED  Follow-Up: At [facility], you and your health needs are our priority.  As part of our continuing mission to provide you with exceptional heart care, we have created designated Provider Care Teams.  These Care Teams include your primary Cardiologist (physician) and Advanced Practice Providers (APPs -  Physician Assistants and Nurse Practitioners) who all work together to provide you with the care you need, when you need it. You will need a follow up appointment in 12 months feb 2021.  Please call our office 2 months in advance to schedule this appointment.  You may see Bryan Lemma, MD  or one of the following Advanced Practice Providers on your designated Care Team:   Theodore Demark, PA-C . Joni Reining, DNP, ANP  Any Other Special Instructions Will Be Listed Below (If Applicable). Please have primary to send copy of labs in 2 months from now.   Please keep a log of blood pressure -if blood pressure continues to  Be elevated over 140's ( top number ) Contact office

## 2018-03-20 NOTE — Assessment & Plan Note (Signed)
Goal LDL is < 70 - on last check 04/2017 was up to 97.  Due for recheck with PCP.  Is on 40 mg rosuvastatin -- may need to consider adding Zetia.

## 2018-03-20 NOTE — Assessment & Plan Note (Signed)
No beta blocker.  Remains asymptomatic.  Monitor for SSx of syncope & weakness/fatigue.

## 2018-03-20 NOTE — Assessment & Plan Note (Signed)
BP has usually been well controlled - @ home in the 120s-130s/70s.  Just came from the gym & has not taken AM BP med.   Monitor - if BP still up @ PCP office, may need to consider amlodipine.

## 2018-04-11 ENCOUNTER — Other Ambulatory Visit: Payer: Medicare Other

## 2018-04-11 DIAGNOSIS — Z0000 Encounter for general adult medical examination without abnormal findings: Secondary | ICD-10-CM | POA: Diagnosis not present

## 2018-04-11 DIAGNOSIS — R944 Abnormal results of kidney function studies: Secondary | ICD-10-CM | POA: Diagnosis not present

## 2018-04-11 DIAGNOSIS — Z125 Encounter for screening for malignant neoplasm of prostate: Secondary | ICD-10-CM | POA: Diagnosis not present

## 2018-04-11 DIAGNOSIS — E785 Hyperlipidemia, unspecified: Secondary | ICD-10-CM | POA: Diagnosis not present

## 2018-04-11 DIAGNOSIS — I2510 Atherosclerotic heart disease of native coronary artery without angina pectoris: Secondary | ICD-10-CM

## 2018-04-11 DIAGNOSIS — D638 Anemia in other chronic diseases classified elsewhere: Secondary | ICD-10-CM | POA: Diagnosis not present

## 2018-04-11 DIAGNOSIS — E119 Type 2 diabetes mellitus without complications: Secondary | ICD-10-CM

## 2018-04-11 DIAGNOSIS — I10 Essential (primary) hypertension: Secondary | ICD-10-CM

## 2018-04-11 LAB — PSA: PSA: 1 ng/mL (ref <=4.0)

## 2018-04-12 LAB — COMPREHENSIVE METABOLIC PANEL
AG Ratio: 1.8 (calc) (ref 1.0–2.5)
ALBUMIN MSPROF: 4.2 g/dL (ref 3.6–5.1)
ALT: 13 U/L (ref 9–46)
AST: 25 U/L (ref 10–35)
Alkaline phosphatase (APISO): 62 U/L (ref 35–144)
BILIRUBIN TOTAL: 0.4 mg/dL (ref 0.2–1.2)
BUN/Creatinine Ratio: 11 (calc) (ref 6–22)
BUN: 20 mg/dL (ref 7–25)
CALCIUM: 9.5 mg/dL (ref 8.6–10.3)
CO2: 24 mmol/L (ref 20–32)
CREATININE: 1.82 mg/dL — AB (ref 0.70–1.18)
Chloride: 102 mmol/L (ref 98–110)
Globulin: 2.4 g/dL (calc) (ref 1.9–3.7)
Glucose, Bld: 103 mg/dL — ABNORMAL HIGH (ref 65–99)
POTASSIUM: 4 mmol/L (ref 3.5–5.3)
SODIUM: 137 mmol/L (ref 135–146)
TOTAL PROTEIN: 6.6 g/dL (ref 6.1–8.1)

## 2018-04-12 LAB — CBC WITH DIFFERENTIAL/PLATELET
ABSOLUTE MONOCYTES: 467 {cells}/uL (ref 200–950)
BASOS PCT: 0.6 %
Basophils Absolute: 38 cells/uL (ref 0–200)
EOS ABS: 77 {cells}/uL (ref 15–500)
Eosinophils Relative: 1.2 %
HCT: 38.4 % — ABNORMAL LOW (ref 38.5–50.0)
HEMOGLOBIN: 12.6 g/dL — AB (ref 13.2–17.1)
Lymphs Abs: 1197 cells/uL (ref 850–3900)
MCH: 27.5 pg (ref 27.0–33.0)
MCHC: 32.8 g/dL (ref 32.0–36.0)
MCV: 83.8 fL (ref 80.0–100.0)
MPV: 9.7 fL (ref 7.5–12.5)
Monocytes Relative: 7.3 %
NEUTROS ABS: 4621 {cells}/uL (ref 1500–7800)
Neutrophils Relative %: 72.2 %
Platelets: 266 10*3/uL (ref 140–400)
RBC: 4.58 10*6/uL (ref 4.20–5.80)
RDW: 13.1 % (ref 11.0–15.0)
Total Lymphocyte: 18.7 %
WBC: 6.4 10*3/uL (ref 3.8–10.8)

## 2018-04-12 LAB — LIPID PANEL
CHOL/HDL RATIO: 3 (calc) (ref <5.0)
CHOLESTEROL: 181 mg/dL (ref <200)
HDL: 61 mg/dL (ref >=40)
LDL Cholesterol (Calc): 100 mg/dL (calc) — ABNORMAL HIGH
Non-HDL Cholesterol (Calc): 120 mg/dL (calc) (ref <130)
TRIGLYCERIDES: 102 mg/dL (ref <150)

## 2018-04-12 LAB — HEMOGLOBIN A1C
HEMOGLOBIN A1C: 6.4 %{Hb} — AB (ref <5.7)
MEAN PLASMA GLUCOSE: 137 (calc)
eAG (mmol/L): 7.6 (calc)

## 2018-04-14 ENCOUNTER — Ambulatory Visit (INDEPENDENT_AMBULATORY_CARE_PROVIDER_SITE_OTHER): Payer: Medicare Other | Admitting: Family Medicine

## 2018-04-14 ENCOUNTER — Encounter: Payer: Self-pay | Admitting: Family Medicine

## 2018-04-14 VITALS — BP 150/80 | HR 64 | Temp 98.2°F | Resp 16 | Ht 69.0 in | Wt 198.0 lb

## 2018-04-14 DIAGNOSIS — I10 Essential (primary) hypertension: Secondary | ICD-10-CM | POA: Diagnosis not present

## 2018-04-14 DIAGNOSIS — Z0000 Encounter for general adult medical examination without abnormal findings: Secondary | ICD-10-CM

## 2018-04-14 DIAGNOSIS — Z23 Encounter for immunization: Secondary | ICD-10-CM | POA: Diagnosis not present

## 2018-04-14 DIAGNOSIS — I2510 Atherosclerotic heart disease of native coronary artery without angina pectoris: Secondary | ICD-10-CM

## 2018-04-14 DIAGNOSIS — E119 Type 2 diabetes mellitus without complications: Secondary | ICD-10-CM | POA: Diagnosis not present

## 2018-04-14 MED ORDER — AMLODIPINE BESYLATE 5 MG PO TABS: 5.0000 mg | ORAL_TABLET | Freq: Every day | ORAL | 3 refills | Status: DC

## 2018-04-14 NOTE — Progress Notes (Signed)
Subjective:    Patient ID: James Jones, male    DOB: 15-Jan-1946, 73 y.o.   MRN: [id number]  HPI  Here today for complete physical exam.  Patient sees Dr. Elvera Lennox to manage his diabetes.  He is currently on metformin.  Hemoglobin A1c is well controlled at 6.4 however his renal function has deteriorated and his creatinine is now 1.82 making him borderline for use for metformin.  He has an appointment to see his endocrinologist later this month and I have encouraged him to discuss with her about possibly switching off of metformin if she feels that that is appropriate.  Blood pressure is also elevated today despite him being on valsartan HCTZ at his maximum dose.  He is getting similar blood pressures at home.  He denies any chest pain shortness of breath or dyspnea on exertion.  Last colonoscopy was in 2014 and was clear for 10 years.  PSA has been stable for the last 2 years at 1.0.  Immunization records are listed below.  He is due for a booster on pneumovax 23.  Also due for Shingrix and tetanus.   Immunization History  Administered Date(s) Administered  . Influenza, High Dose Seasonal PF 11/24/2017  . Influenza,inj,Quad PF,6+ Mos 10/22/2014, 10/29/2015  . Pneumococcal Conjugate-13 04/13/2016  . Pneumococcal Polysaccharide-23 02/09/2008  . Tdap 02/09/2008      His most recent lab work as listed below: Lab on 04/11/2018  Component Date Value Ref Range Status  . WBC 04/11/2018 6.4  3.8 - 10.8 Thousand/uL Final  . RBC 04/11/2018 4.58  4.20 - 5.80 Million/uL Final  . Hemoglobin 04/11/2018 12.6* 13.2 - 17.1 g/dL Final  . HCT 42/35/3614 38.4* 38.5 - 50.0 % Final  . MCV 04/11/2018 83.8  80.0 - 100.0 fL Final  . MCH 04/11/2018 27.5  27.0 - 33.0 pg Final  . MCHC 04/11/2018 32.8  32.0 - 36.0 g/dL Final  . RDW 43/15/4008 13.1  11.0 - 15.0 % Final  . Platelets 04/11/2018 266  140 - 400 Thousand/uL Final  . MPV 04/11/2018 9.7  7.5 - 12.5 fL Final  . Neutro Abs 04/11/2018 4,621  1,500 - 7,800  cells/uL Final  . Lymphs Abs 04/11/2018 1,197  850 - 3,900 cells/uL Final  . Absolute Monocytes 04/11/2018 467  200 - 950 cells/uL Final  . Eosinophils Absolute 04/11/2018 77  15 - 500 cells/uL Final  . Basophils Absolute 04/11/2018 38  0 - 200 cells/uL Final  . Neutrophils Relative % 04/11/2018 72.2  % Final  . Total Lymphocyte 04/11/2018 18.7  % Final  . Monocytes Relative 04/11/2018 7.3  % Final  . Eosinophils Relative 04/11/2018 1.2  % Final  . Basophils Relative 04/11/2018 0.6  % Final  . Glucose, Bld 04/11/2018 103* 65 - 99 mg/dL Final   Comment: .            Fasting reference interval . For someone without known diabetes, a glucose value between 100 and 125 mg/dL is consistent with prediabetes and should be confirmed with a follow-up test. .   . BUN 04/11/2018 20  7 - 25 mg/dL Final  . Creat 67/61/9509 1.82* 0.70 - 1.18 mg/dL Final   Comment: For patients >32 years of age, the reference limit for Creatinine is approximately 13% higher for people identified as African-American. .   [name] Ratio 04/11/2018 11  6 - 22 (calc) Final  . Sodium 04/11/2018 137  135 - 146 mmol/L Final  . Potassium 04/11/2018 4.0  3.5 -  5.3 mmol/L Final  . Chloride 04/11/2018 102  98 - 110 mmol/L Final  . CO2 04/11/2018 24  20 - 32 mmol/L Final  . Calcium 04/11/2018 9.5  8.6 - 10.3 mg/dL Final  . Total Protein 04/11/2018 6.6  6.1 - 8.1 g/dL Final  . Albumin 40/98/119103/04/2018 4.2  3.6 - 5.1 g/dL Final  . Globulin 47/82/956203/04/2018 2.4  1.9 - 3.7 g/dL (calc) Final  . AG Ratio 04/11/2018 1.8  1.0 - 2.5 (calc) Final  . Total Bilirubin 04/11/2018 0.4  0.2 - 1.2 mg/dL Final  . Alkaline phosphatase (APISO) 04/11/2018 62  35 - 144 U/L Final  . AST 04/11/2018 25  10 - 35 U/L Final  . ALT 04/11/2018 13  9 - 46 U/L Final  . Cholesterol 04/11/2018 181  <200 mg/dL Final  . HDL 13/08/657803/04/2018 61  > OR = 40 mg/dL Final  . Triglycerides 04/11/2018 102  <150 mg/dL Final  . LDL Cholesterol (Calc) 04/11/2018 100* mg/dL  (calc) Final   Comment: Reference range: <100 . Desirable range <100 mg/dL for primary prevention;   <70 mg/dL for patients with CHD or diabetic patients  with > or = 2 CHD risk factors. Marland Kitchen. LDL-C is now calculated using the Martin-Hopkins  calculation, which is a validated novel method providing  better accuracy than the Friedewald equation in the  estimation of LDL-C.  Horald PollenMartin SS et al. Lenox AhrJAMA. 4696;295(282013;310(19): 2061-2068  (http://education.[facility].com/faq/FAQ164)   . Total CHOL/HDL Ratio 04/11/2018 3.0  <4.1<5.0 (calc) Final  . Non-HDL Cholesterol (Calc) 04/11/2018 120  <130 mg/dL (calc) Final   Comment: For patients with diabetes plus 1 major ASCVD risk  factor, treating to a non-HDL-C goal of <100 mg/dL  (LDL-C of <32<70 mg/dL) is considered a therapeutic  option.   . Hgb A1c MFr Bld 04/11/2018 6.4* <5.7 % of total Hgb Final   Comment: For someone without known diabetes, a hemoglobin  A1c value between 5.7% and 6.4% is consistent with prediabetes and should be confirmed with a  follow-up test. . For someone with known diabetes, a value <7% indicates that their diabetes is well controlled. A1c targets should be individualized based on duration of diabetes, age, comorbid conditions, and other considerations. . This assay result is consistent with an increased risk of diabetes. . Currently, no consensus exists regarding use of hemoglobin A1c for diagnosis of diabetes for children. .   . Mean Plasma Glucose 04/11/2018 137  (calc) Final  . eAG (mmol/L) 04/11/2018 7.6  (calc) Final  . PSA 04/11/2018 1.0  < OR = 4.0 ng/mL Final   Comment: The total PSA value from this assay system is  standardized against the WHO standard. The test  result will be approximately 20% lower when compared  to the equimolar-standardized total PSA (Beckman  Coulter). Comparison of serial PSA results should be  interpreted with this fact in mind. . This test was performed using the Siemens    chemiluminescent method. Values obtained from  different assay methods cannot be used interchangeably. PSA levels, regardless of value, should not be interpreted as absolute evidence of the presence or absence of disease.    Past Medical History:  Diagnosis Date  . 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 Coronary artery disease, non-occlusive 03/18/2012   60-70% stenosis in RCA -- FFR 0.84; EF 60-65%  . Diabetes mellitus without complication (HCC)   . GERD (gastroesophageal reflux disease)    On PPI  . History of GI bleed January  2015   No obvious findings on EGD/colonoscopy  . Hyperlipidemia   . Hypertension   . OSA (obstructive sleep apnea) 03/18/2012   Doing better with CPAP  . Pulmonary hypertension (HCC) 12/10/2010   ECHO:  Mild PH,mild LVH; PA pressures estimated 30-40 mmHg  . 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 APPENDECTOMY  1974  . CARPAL TUNNEL RELEASE    . EYE SURGERY  1960   Left eye  . HEMORRHOID SURGERY    . LEFT HEART CATH AND CORONARY ANGIOGRAPHY  03/27/2010   Moderate mid RCA lesion,right radial approach,normal EF  . LEFT HEART CATHETERIZATION WITH CORONARY ANGIOGRAM N/A 03/17/2012   Procedure: LEFT HEART CATHETERIZATION WITH CORONARY ANGIOGRAM;  Surgeon: Marykay Lex, MD;  Location: [facility] C[facility] LAB;  Service: Cardiovascular: Fractional Flow Reserve Measurement of mid RCA 60-70% stenosis = 0.84. Otherwise mild LCA CAD.  Normal EF & EDP  . SHOULDER ARTHROSCOPY  2006  . TRANSTHORACIC ECHOCARDIOGRAM  12/2010    Mild concentric LVH.  EF> 55%.  GR 1 DD.  Mild aortic sclerosis no stenosis.  PA pressures estimated 30-40 mmHg   Current Outpatient Medications on File Prior to Visit  Medication Sig Dispense Refill  . Blood Glucose Monitoring Suppl (FREESTYLE LITE) DEVI Use to check blood sugar  once daily 1 each 0  . Cholecalciferol (VITAMIN D-3) 1000 UNITS CAPS Take by mouth daily. 2 capsules.    . COLCRYS 0.6 MG tablet TAKE 1 TABLET BY MOUTH 2 TIMES DAILY AS NEEDED (Patient taking differently: Take 0.6 mg by mouth 2 (two) times daily as needed (gout flare up). ) 30 tablet 3  . ferrous sulfate 325 (65 FE) MG tablet Take 1 tablet (325 mg total) by mouth 2 (two) times daily with a meal.  3  . fish oil-omega-3 fatty acids 1000 MG capsule Take 1 g by mouth daily.    Marland Kitchen glucose blood (FREESTYLE LITE) test strip USE TO CHECK BLOOD SUGAR ONCE DAILY. 100 each 12  . metFORMIN (GLUCOPHAGE-XR) 500 MG 24 hr tablet Take 1 tablet (500 mg total) by mouth daily with breakfast. 90 tablet 3  . Multiple Vitamin (MULTIVITAMIN WITH MINERALS) TABS Take 1 tablet by mouth daily.    . Potassium 99 MG TABS Take 99 mg by mouth daily.     Marland Kitchen PRESCRIPTION MEDICATION Uses C-PAP at bedtime    . rosuvastatin (CRESTOR) 40 MG tablet TAKE 1 TABLET BY MOUTH EVERY DAY (Patient taking differently: Take 40 mg by mouth daily. ) 90 tablet 2  . valsartan-hydrochlorothiazide (DIOVAN-HCT) 320-25 MG tablet TAKE 1 TABLET EVERY DAY 90 tablet 3   No current facility-administered medications on file prior to visit.    No Known Allergies Social History   Socioeconomic History  . Marital status: Married    Spouse name: Not on file  . Number of children: Not on file  . Years of education: Not on file  . Highest education level: Not on file  Occupational History  . Not on file  Social Needs  . Financial resource strain: Not on file  . Food insecurity:    Worry: Not on file    Inability: Not on file  . Transportation needs:    Medical: Not on file    Non-medical: Not on file  Tobacco Use  . Smoking status: Former Smoker    Packs/day: 1.00  Types: Cigarettes    Last attempt to quit: 04/12/2012    Years since quitting: 6.0  . Smokeless tobacco: Never Used  Substance and Sexual Activity  . Alcohol use: Yes     Alcohol/week: 1.0 standard drinks    Types: 1 Standard drinks or equivalent per week  . Drug use: Yes    Types: Marijuana    Comment: cannibus  . Sexual activity: Not on file  Lifestyle  . Physical activity:    Days per week: Not on file    Minutes per session: Not on file  . Stress: Not on file  Relationships  . Social connections:    Talks on phone: Not on file    Gets together: Not on file    Attends religious service: Not on file    Active member of club or organization: Not on file    Attends meetings of clubs or organizations: Not on file    Relationship status: Not on file  . Intimate partner violence:    Fear of current or ex partner: Not on file    Emotionally abused: Not on file    Physically abused: Not on file    Forced sexual activity: Not on file  Other Topics Concern  . Not on file  Social History Narrative   Married, father of 2, grandfather of 3.   He is a former smoker of about a pack to pack and half cigarettes a day -- he quit in March of this year.    He is an avid exerciser working at least 4-5 days a week doing her walking or stationary bike.   Family History  Problem Relation Age of Onset  . Kidney failure Mother   . Coronary artery disease Father   . Kidney failure Sister      Review of Systems  All other systems reviewed and are negative.      Objective:   Physical Exam  Constitutional: He is oriented to person, place, and time. He appears well-developed and well-nourished. No distress.  HENT:  Head: Normocephalic and atraumatic.  Right Ear: External ear normal.  Left Ear: External ear normal.  Nose: Nose normal.  Mouth/Throat: Oropharynx is clear and moist. No oropharyngeal exudate.  Eyes: Pupils are equal, round, and reactive to light. Conjunctivae and EOM are normal. Right eye exhibits no discharge. Left eye exhibits no discharge. No scleral icterus.  Neck: Normal range of motion. Neck supple. No JVD present. No tracheal deviation  present. No thyromegaly present.  Cardiovascular: Normal rate, regular rhythm and intact distal pulses. Exam reveals no gallop and no friction rub.  Murmur heard. Pulmonary/Chest: Effort normal and breath sounds normal. No stridor. No respiratory distress. He has no wheezes. He has no rales. He exhibits no tenderness.  Abdominal: Soft. Bowel sounds are normal. He exhibits no distension and no mass. There is no abdominal tenderness. There is no rebound and no guarding.  Genitourinary:    Prostate and rectum normal.   Musculoskeletal: Normal range of motion.        General: No tenderness, deformity or edema.  Lymphadenopathy:    He has no cervical adenopathy.  Neurological: He is alert and oriented to person, place, and time. He has normal reflexes. No cranial nerve deficit. He exhibits normal muscle tone. Coordination normal.  Skin: Skin is warm. No rash noted. He is not diaphoretic. No erythema. No pallor.  Psychiatric: He has a normal mood and affect. His behavior is normal. Judgment and thought content  normal.  Vitals reviewed.         Assessment & Plan:  Controlled type 2 diabetes mellitus without complication, without long-term current use of insulin (HCC)  ASCVD (arteriosclerotic cardiovascular disease)  Essential hypertension  General medical exam  Physical exam today is concerning mainly for beta blood pressure.  I recommended adding amlodipine 5 mg a day and rechecking blood pressure in 1 month.  I have instructed the patient to avoid NSAIDs due to his chronic kidney disease.  I recommended that he discuss with his endocrinologist possibly switching off metformin if she feels that that is appropriate due to his borderline stage IV chronic kidney disease.  Patient received Pneumovax 23 today.  Prevnar 13 is up-to-date.  I recommended Shingrix.  Patient politely declined a tetanus shot.  Colonoscopy is up-to-date.  PSA is normal.  Hemoglobin A1c is outstanding.  Cholesterol is  acceptable.  Patient denies any problems with falls, depression, or memory loss.  Regular anticipatory guidance is provided.

## 2018-04-14 NOTE — Addendum Note (Signed)
Addended by: Legrand Rams B on: 04/14/2018 04:05 PM   Modules accepted: Orders

## 2018-05-23 ENCOUNTER — Ambulatory Visit (INDEPENDENT_AMBULATORY_CARE_PROVIDER_SITE_OTHER): Payer: Medicare Other | Admitting: Internal Medicine

## 2018-05-23 ENCOUNTER — Encounter: Payer: Self-pay | Admitting: Internal Medicine

## 2018-05-23 ENCOUNTER — Other Ambulatory Visit: Payer: Self-pay

## 2018-05-23 DIAGNOSIS — E663 Overweight: Secondary | ICD-10-CM | POA: Diagnosis not present

## 2018-05-23 DIAGNOSIS — I2510 Atherosclerotic heart disease of native coronary artery without angina pectoris: Secondary | ICD-10-CM | POA: Diagnosis not present

## 2018-05-23 DIAGNOSIS — E1159 Type 2 diabetes mellitus with other circulatory complications: Secondary | ICD-10-CM | POA: Diagnosis not present

## 2018-05-23 DIAGNOSIS — E785 Hyperlipidemia, unspecified: Secondary | ICD-10-CM | POA: Diagnosis not present

## 2018-05-23 NOTE — Patient Instructions (Signed)
Continue metformin ER 500 mg with breakfast.  Please return in 4 months with your sugar log.

## 2018-05-23 NOTE — Progress Notes (Signed)
Spoke with patient ahead of scheduled Doxy visit with Dr. Elvera Lennox.  Patient correctly identified.  Verified patient's medications, drug allergies and pharmacy.

## 2018-05-23 NOTE — Progress Notes (Signed)
Patient ID: James Jones, male   DOB: [birth year], 73 y.o.   MRN: [id number]  Patient location: Home My location: Office   Referring Provider: Donita BrooksPickard, Warren T, MD  I connected with the patient on 05/23/18 at  9:06 AM EDT by a video enabled telemedicine application and verified that I am speaking with the correct person.   I discussed the limitations of evaluation and management by telemedicine and the availability of in person appointments. The patient expressed understanding and agreed to proceed.   Details of the encounter are shown below.  HPI: James Jones is a 73 y.o.-year-old male, returning for f/u for DM2, dx in ~2005, non-insulin-dependent, lately more controlled, with complications (CAD, CKD). Last visit 6 months ago. PCP: Dr. Lynnea FerrierWarren Pickard  Last hemoglobin A1c was: Lab Results  Component Value Date   HGBA1C 6.4 (H) 04/11/2018   HGBA1C 6.2 (A) 11/24/2017   HGBA1C 6.9 (A) 07/21/2017  04/11/2014: HbA1c 8.4% 01/08/2014: HbA1c 7.7% He had steroid inj for gout in his elbows - 12/2013. He also had a steroid inj in knee fall 2016, too.   He is on: -Metformin ER 500 mg daily with b'fast-restarted 07/2017  He was previously on Januvia. We stopped Glipizide ER 2.5 mg daily in 07/2015. Had nausea, vomiting, loss of appetite with regular metformin.  He was on Actos. He was on insulin before (Lantus) - came off years ago.  Pt checks his sugars 1-3 times a day: - am: 101-141, 168 >> 87-116, 154 >> 101-113 - 2h after b'fast: 68, 71-156, 161 >> 107 >> n/c - before lunch: 96-110 >> 75, 99 >> 113-145 - 2h after lunch: 61 -120 >> 107, 118 >> n/c - before dinner:  74-114 >> 110-148 (snack) - 2h after dinner:110-171 >> 102 >> n/c - bedtime: 123, 146 >> 89-135 >> n/c - nighttime: 89 >> 112 >> n/c >> 100 >> n/c  Lowest 54 x1 >> 72 >> 74 >> 101; he has hypoglycemia awareness in the 70s. Highest sugar was 161 >> 171 >> 154 >> 148.  Glucometer: Freestyle [employer] did not cover  Freestyle Libre CGM.  Pt's meals are: - Breakfast: bacon + eggs, sausage, grits, oatmeal, cereal, toast wheat - Lunch: BLT, soups, fruit, nuts - Dinner: chicken, pork + greens, rice  - Snacks: 1 a day: pretzels; carrots  She is now not exercising now - prev. exercising by walking several times a week.  -+ CKD-sees nephrology (had an U/S >> renal cysts, otherwise normal), latest BUN/creatinine:  Lab Results  Component Value Date   BUN 20 04/11/2018   CREATININE 1.82 (H) 04/11/2018  05/24/2014: 34/1.64 01/08/2014: 18/1.15 On valsartan. -+ HL; last set of lipids: Lab Results  Component Value Date   CHOL 181 04/11/2018   HDL 61 04/11/2018   LDLCALC 100 (H) 04/11/2018   TRIG 102 04/11/2018   CHOLHDL 3.0 04/11/2018  04/11/2014: 206/143/70/106 01/08/2014: 161/98/60/73 On rosuvastatin 40. - last eye exam was in Spring 2020: No DR reportedly.  He has history of cataract surgery in 2016 and 17. -he denies numbness and tingling in his feet.  Latest TSH was normal: Lab Results  Component Value Date   TSH 1.82 04/08/2016   He has a history of hand itching, relieved by Zyrtec.  ROS: Constitutional: no weight gain/no weight loss, no fatigue, no subjective hyperthermia, no subjective hypothermia Eyes: no blurry vision, no xerophthalmia ENT: no sore throat, no nodules palpated in neck, no dysphagia, no odynophagia, no hoarseness Cardiovascular: no CP/no SOB/no palpitations/no leg  swelling Respiratory: no cough/no SOB/no wheezing Gastrointestinal: no N/no V/no D/no C/no acid reflux Musculoskeletal: no muscle aches/no joint aches Skin: no rashes, no hair loss Neurological: no tremors/no numbness/no tingling/no dizziness  I reviewed pt's medications, allergies, PMH, social hx, family hx, and changes were documented in the history of present illness. Otherwise, unchanged from my initial visit note.  Past Medical History:  Diagnosis Date  . CKD (chronic kidney disease) stage 2, GFR  60-89 ml/min 03/17/2012  . Coronary artery disease, non-occlusive 03/18/2012   60-70% stenosis in RCA -- FFR 0.84; EF 60-65%  . Diabetes mellitus without complication (HCC)   . GERD (gastroesophageal reflux disease)    On PPI  . History of GI bleed January 2015   No obvious findings on EGD/colonoscopy  . Hyperlipidemia   . Hypertension   . OSA (obstructive sleep apnea) 03/18/2012   Doing better with CPAP  . Pulmonary hypertension (HCC) 12/10/2010   ECHO:  Mild PH,mild LVH; PA pressures estimated 30-40 mmHg  . 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   Event monitor; usually during sleeping hours; therefore not on beta blocker  + Gout  Past Surgical History:  Procedure Laterality Date  . APPENDECTOMY  1974  . CARPAL TUNNEL RELEASE    . EYE SURGERY  1960   Left eye  . HEMORRHOID SURGERY    . LEFT HEART CATH AND CORONARY ANGIOGRAPHY  03/27/2010   Moderate mid RCA lesion,right radial approach,normal EF  . LEFT HEART CATHETERIZATION WITH CORONARY ANGIOGRAM N/A 03/17/2012   Procedure: LEFT HEART CATHETERIZATION WITH CORONARY ANGIOGRAM;  Surgeon: Marykay Lex, MD;  Location: [facility] CATH LAB;  Service: Cardiovascular: Fractional Flow Reserve Measurement of mid RCA 60-70% stenosis = 0.84. Otherwise mild LCA CAD.  Normal EF & EDP  . SHOULDER ARTHROSCOPY  2006  . TRANSTHORACIC ECHOCARDIOGRAM  12/2010    Mild concentric LVH.  EF> 55%.  GR 1 DD.  Mild aortic sclerosis no stenosis.  PA pressures estimated 30-40 mmHg   History   Social History  . Marital Status: Married    Spouse Name: N/A   Occupational History  . retired   Social History Main Topics  . Smoking status: Former Smoker -- 2.00 packs/day    Quit date: 04/12/2012  . Smokeless tobacco: Never Used  . Alcohol Use: 0.5 oz/week    1 drink(s) per week  . Drug Use: Yes    Special: Marijuana     Comment: cannibus   Social History Narrative   Married, father of 2, grandfather of 3.   He is a former smoker of about a  pack to pack and half cigarettes a day -- he quit in March of this year.    He is an avid exerciser working at least 4-5 days a week doing her walking or stationary bike.   Current Outpatient Medications on File Prior to Visit  Medication Sig Dispense Refill  . amLODipine (NORVASC) 5 MG tablet Take 1 tablet (5 mg total) by mouth daily. 90 tablet 3  . Blood Glucose Monitoring Suppl (FREESTYLE LITE) DEVI Use to check blood sugar once daily 1 each 0  . Cholecalciferol (VITAMIN D-3) 1000 UNITS CAPS Take by mouth daily. 2 capsules.    . COLCRYS 0.6 MG tablet TAKE 1 TABLET BY MOUTH 2 TIMES DAILY AS NEEDED (Patient taking differently: Take 0.6 mg by mouth 2 (two) times daily as needed (gout flare up). ) 30 tablet 3  . ferrous sulfate 325 (65 FE) MG tablet Take  1 tablet (325 mg total) by mouth 2 (two) times daily with a meal.  3  . fish oil-omega-3 fatty acids 1000 MG capsule Take 1 g by mouth daily.    Marland Kitchen glucose blood (FREESTYLE LITE) test strip USE TO CHECK BLOOD SUGAR ONCE DAILY. 100 each 12  . metFORMIN (GLUCOPHAGE-XR) 500 MG 24 hr tablet Take 1 tablet (500 mg total) by mouth daily with breakfast. 90 tablet 3  . Multiple Vitamin (MULTIVITAMIN WITH MINERALS) TABS Take 1 tablet by mouth daily.    . Potassium 99 MG TABS Take 99 mg by mouth daily.     Marland Kitchen PRESCRIPTION MEDICATION Uses C-PAP at bedtime    . rosuvastatin (CRESTOR) 40 MG tablet TAKE 1 TABLET BY MOUTH EVERY DAY (Patient taking differently: Take 40 mg by mouth daily. ) 90 tablet 2  . valsartan-hydrochlorothiazide (DIOVAN-HCT) 320-25 MG tablet TAKE 1 TABLET EVERY DAY 90 tablet 3   No current facility-administered medications on file prior to visit.    No Known Allergies Family History  Problem Relation Age of Onset  . Kidney failure Mother   . Coronary artery disease Father   . Kidney failure Sister    PE: There were no vitals taken for this visit. There is no height or weight on file to calculate BMI. Wt Readings from Last 3  Encounters:  04/14/18 198 lb (89.8 kg)  03/20/18 197 lb (89.4 kg)  11/24/17 195 lb (88.5 kg)   Constitutional:  in NAD  The physical exam was not performed (virtual visit).  ASSESSMENT: 1. DM2, non-insulin-dependent, uncontrolled, with complications - CAD - cardiologist Dr. Herbie Baltimore - CKD - started to see Dr Kristian Covey  2. HL  3. Overweight  PLAN:  1. Patient with longstanding, previously uncontrolled type 2 diabetes, with much improved control in the last 1.5 years.  He was previously on glipizide ER, which he was able to stop in 2017.  He was also on Januvia in the past when he had to come off metformin due to decreased GFR.  He remains off Januvia but is on a low dose metformin ER now. -At last visit, sugars improved, all at goal, and HbA1c was also better, decreased from 6.9% to 6.2% after we restarted metformin.  -He had another HbA1c since then, last week, and this was only slightly higher, at 6.4%, still at goal -At this visit, sugars are slightly higher before meals, but he tells me that these are due to snacks and also grapefruit juice.  I advised him to try to reduce these or at least dilute the juice. -We were also talking about his treatment with metformin in the setting of kidney function.  His GFR is above 30 (approximately 42), so for now, we can continue with low-dose metformin -  I suggested to:  Patient Instructions  Continue metformin ER 500 mg with breakfast.  Please return in 4 months with your sugar log.   - We will check his HbA1c at next visit - continue checking sugars at different times of the day - check 1x a day, rotating checks - advised for yearly eye exams >> he is UTD - Return to clinic in 4 mo with sugar log      2. HL - Reviewed latest lipid panel from last month: LDL above target, the rest of the fractions at goal Lab Results  Component Value Date   CHOL 181 04/11/2018   HDL 61 04/11/2018   LDLCALC 100 (H) 04/11/2018   TRIG 102 04/11/2018  CHOLHDL 3.0 04/11/2018  - Continues high-dose rosuvastatin without side effects.  3. Obesity -On metformin which can help reducing appetite and stabilizing weight long-term -He gained approximately 3 pounds since last visit -He will also restart exercising after the corona virus pandemic winds down  Carlus Pavlov, MD PhD [facility]

## 2018-05-29 ENCOUNTER — Other Ambulatory Visit: Payer: Self-pay

## 2018-05-29 ENCOUNTER — Encounter: Payer: Self-pay | Admitting: Family Medicine

## 2018-05-29 ENCOUNTER — Ambulatory Visit (INDEPENDENT_AMBULATORY_CARE_PROVIDER_SITE_OTHER): Payer: Medicare Other | Admitting: Family Medicine

## 2018-05-29 VITALS — BP 130/70 | HR 66 | Temp 99.0°F | Resp 18 | Ht 69.0 in | Wt 209.0 lb

## 2018-05-29 DIAGNOSIS — I2510 Atherosclerotic heart disease of native coronary artery without angina pectoris: Secondary | ICD-10-CM | POA: Diagnosis not present

## 2018-05-29 DIAGNOSIS — M2550 Pain in unspecified joint: Secondary | ICD-10-CM | POA: Diagnosis not present

## 2018-05-29 MED ORDER — PREDNISONE 20 MG PO TABS: ORAL_TABLET | ORAL | 0 refills | Status: DC

## 2018-05-29 NOTE — Progress Notes (Signed)
Subjective:    Patient ID: James Jones, male    DOB: [birth year], 73 y.o.   MRN: [id number]  HPI  Had MRI of lumbar spine in 2017: IMPRESSION: 1. Congenital and acquired lumbar spinal stenosis as described. 2. Mild subarticular and moderate foraminal stenosis at L2-3, worse on the left. 3. Moderate subarticular narrowing at L3-4 is worse on the left. 4. Moderate foraminal stenosis bilaterally at L3-4. 5. Mild subarticular and severe foraminal stenosis bilaterally at L4-5. Facet spurring contributes. 6. Moderate right subarticular and foraminal narrowing at L5-S1. Left foraminal narrowing is mild.  Patient states for the last several months he has had pain in his lower back roughly at the level of L5 however it radiates in a bandlike fashion above both hips bilaterally and throbs.  He states when he gets out of bed in the morning it is excruciating pain.  Is difficult even to begin moving.  He also complains of pain in both hips.  He also has pain and stiffness in both shoulders.  He also occasionally will hurt in both knees and in both ankles and occasionally in both wrist.  He denies any fevers or chills or weight loss.  He is tried Aleve occasionally which I warned him not to do given his chronic kidney disease but is seen no benefit from Aleve.  He has not tried any Tylenol.  He is not had any x-rays of his hips or shoulders. Past Medical History:  Diagnosis Date  . 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 Coronary artery disease, non-occlusive 03/18/2012   60-70% stenosis in RCA -- FFR 0.84; EF 60-65%  . Diabetes mellitus without complication (HCC)   . GERD (gastroesophageal reflux disease)    On PPI  . History of GI bleed January 2015   No obvious findings on EGD/colonoscopy  . Hyperlipidemia   . Hypertension   . OSA (obstructive sleep apnea) 03/18/2012   Doing better with CPAP  . Pulmonary hypertension (HCC) 12/10/2010   ECHO:  Mild PH,mild LVH; PA  pressures estimated 30-40 mmHg  . 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 APPENDECTOMY  1974  . CARPAL TUNNEL RELEASE    . EYE SURGERY  1960   Left eye  . HEMORRHOID SURGERY    . LEFT HEART CATH AND CORONARY ANGIOGRAPHY  03/27/2010   Moderate mid RCA lesion,right radial approach,normal EF  . LEFT HEART CATHETERIZATION WITH CORONARY ANGIOGRAM N/A 03/17/2012   Procedure: LEFT HEART CATHETERIZATION WITH CORONARY ANGIOGRAM;  Surgeon: Marykay Lex, MD;  Location: [facility] CATH LAB;  Service: Cardiovascular: Fractional Flow Reserve Measurement of mid RCA 60-70% stenosis = 0.84. Otherwise mild LCA CAD.  Normal EF & EDP  . SHOULDER ARTHROSCOPY  2006  . TRANSTHORACIC ECHOCARDIOGRAM  12/2010    Mild concentric LVH.  EF> 55%.  GR 1 DD.  Mild aortic sclerosis no stenosis.  PA pressures estimated 30-40 mmHg   Current Outpatient Medications on File Prior to Visit  Medication Sig Dispense Refill  . amLODipine (NORVASC) 5 MG tablet Take 1 tablet (5 mg total) by mouth daily. 90 tablet 3  . Blood Glucose Monitoring Suppl (FREESTYLE LITE) DEVI Use to check blood sugar once daily 1 each 0  . Cholecalciferol (VITAMIN D-3) 1000 UNITS CAPS Take by mouth daily. 2 capsules.    . COLCRYS 0.6 MG  tablet TAKE 1 TABLET BY MOUTH 2 TIMES DAILY AS NEEDED (Patient taking differently: Take 0.6 mg by mouth 2 (two) times daily as needed (gout flare up). ) 30 tablet 3  . ferrous sulfate 325 (65 FE) MG tablet Take 1 tablet (325 mg total) by mouth 2 (two) times daily with a meal.  3  . fish oil-omega-3 fatty acids 1000 MG capsule Take 1 g by mouth daily.    Marland Kitchen. glucose blood (FREESTYLE LITE) test strip USE TO CHECK BLOOD SUGAR ONCE DAILY. 100 each 12  . metFORMIN (GLUCOPHAGE-XR) 500 MG 24 hr tablet Take 1 tablet (500 mg total) by mouth daily with breakfast. 90 tablet 3  . Multiple Vitamin  (MULTIVITAMIN WITH MINERALS) TABS Take 1 tablet by mouth daily.    . Potassium 99 MG TABS Take 99 mg by mouth daily.     Marland Kitchen. PRESCRIPTION MEDICATION Uses C-PAP at bedtime    . rosuvastatin (CRESTOR) 40 MG tablet TAKE 1 TABLET BY MOUTH EVERY DAY (Patient taking differently: Take 40 mg by mouth daily. ) 90 tablet 2  . valsartan-hydrochlorothiazide (DIOVAN-HCT) 320-25 MG tablet TAKE 1 TABLET EVERY DAY 90 tablet 3   No current facility-administered medications on file prior to visit.    No Known Allergies Social History   Socioeconomic History  . Marital status: Married    Spouse name: Not on file  . Number of children: Not on file  . Years of education: Not on file  . Highest education level: Not on file  Occupational History  . Not on file  Social Needs  . Financial resource strain: Not on file  . Food insecurity:    Worry: Not on file    Inability: Not on file  . Transportation needs:    Medical: Not on file    Non-medical: Not on file  Tobacco Use  . Smoking status: Former Smoker    Packs/day: 1.00    Types: Cigarettes    Last attempt to quit: 04/12/2012    Years since quitting: 6.1  . Smokeless tobacco: Never Used  Substance and Sexual Activity  . Alcohol use: Yes    Alcohol/week: 1.0 standard drinks    Types: 1 Standard drinks or equivalent per week  . Drug use: Yes    Types: Marijuana    Comment: cannibus  . Sexual activity: Not on file  Lifestyle  . Physical activity:    Days per week: Not on file    Minutes per session: Not on file  . Stress: Not on file  Relationships  . Social connections:    Talks on phone: Not on file    Gets together: Not on file    Attends religious service: Not on file    Active member of club or organization: Not on file    Attends meetings of clubs or organizations: Not on file    Relationship status: Not on file  . Intimate partner violence:    Fear of current or ex partner: Not on file    Emotionally abused: Not on file     Physically abused: Not on file    Forced sexual activity: Not on file  Other Topics Concern  . Not on file  Social History Narrative   Married, father of 2, grandfather of 3.   He is a former smoker of about a pack to pack and half cigarettes a day -- he quit in March of this year.    He is an avid exerciser working at  least 4-5 days a week doing her walking or stationary bike.      Review of Systems  All other systems reviewed and are negative.      Objective:   Physical Exam  Constitutional: He appears well-developed and well-nourished.  Neck: Neck supple. No JVD present. No thyromegaly present.  Cardiovascular: Normal rate, regular rhythm, normal heart sounds and intact distal pulses.  No murmur heard. Pulmonary/Chest: Effort normal and breath sounds normal. No respiratory distress. He has no wheezes. He has no rales.  Abdominal: Soft. Bowel sounds are normal. He exhibits no distension and no mass. There is no abdominal tenderness. There is no rebound and no guarding.  Musculoskeletal:        General: No edema.     Right shoulder: He exhibits pain. He exhibits normal range of motion and no crepitus.     Left shoulder: He exhibits pain. He exhibits normal range of motion and no crepitus.     Right hip: He exhibits normal range of motion.     Left hip: He exhibits normal range of motion.     Lumbar back: He exhibits decreased range of motion and pain. He exhibits no bony tenderness and no spasm.  Lymphadenopathy:    He has no cervical adenopathy.  Vitals reviewed.         Assessment & Plan:  Polyarthralgia - Plan: CK, BASIC METABOLIC PANEL WITH GFR, Rheumatoid factor, Sedimentation rate, ANA  Differential diagnosis is broad.  Symptoms could be attributed to osteoarthritis in the hips lower back and shoulders.  Rheumatoid arthritis is on the differential diagnosis.  Polymyalgia rheumatica is on the differential diagnosis.  Statin induced arthropathy and myopathy is on the  differential diagnosis.  Begin by obtaining lab work including a CK, sedimentation rate, rheumatoid factor, and ANA.  After he draws lab work I would like to empirically try treating him for PMR with prednisone and then recheck the patient in 1 week to see how his symptoms did on the prednisone.  Consider empirically holding the Crestor if prednisone does not help his pain or if his CK level is slightly elevated

## 2018-05-30 ENCOUNTER — Other Ambulatory Visit: Payer: Medicare Other

## 2018-05-30 DIAGNOSIS — M2550 Pain in unspecified joint: Secondary | ICD-10-CM | POA: Diagnosis not present

## 2018-05-31 LAB — BASIC METABOLIC PANEL WITH GFR
BUN/Creatinine Ratio: 10 (calc) (ref 6–22)
BUN: 16 mg/dL (ref 7–25)
CO2: 23 mmol/L (ref 20–32)
Calcium: 9.1 mg/dL (ref 8.6–10.3)
Chloride: 103 mmol/L (ref 98–110)
Creat: 1.6 mg/dL — ABNORMAL HIGH (ref 0.70–1.18)
GFR, Est African American: 48 mL/min/{1.73_m2} — ABNORMAL LOW (ref >=60)
GFR, Est Non African American: 42 mL/min/{1.73_m2} — ABNORMAL LOW (ref >=60)
Glucose, Bld: 102 mg/dL — ABNORMAL HIGH (ref 65–99)
Potassium: 4.2 mmol/L (ref 3.5–5.3)
Sodium: 138 mmol/L (ref 135–146)

## 2018-05-31 LAB — CK: Total CK: 133 U/L (ref 44–196)

## 2018-05-31 LAB — SEDIMENTATION RATE: Sed Rate: 14 mm/h (ref 0–20)

## 2018-05-31 LAB — RHEUMATOID FACTOR: Rhuematoid fact SerPl-aCnc: <14 IU/mL (ref <14)

## 2018-05-31 LAB — ANA: Anti Nuclear Antibody (ANA): NEGATIVE

## 2018-06-12 ENCOUNTER — Other Ambulatory Visit: Payer: Self-pay

## 2018-06-12 ENCOUNTER — Encounter: Payer: Self-pay | Admitting: Family Medicine

## 2018-06-12 ENCOUNTER — Ambulatory Visit (INDEPENDENT_AMBULATORY_CARE_PROVIDER_SITE_OTHER): Payer: Medicare Other | Admitting: Family Medicine

## 2018-06-12 VITALS — BP 150/68 | HR 78 | Temp 99.2°F | Resp 20 | Ht 69.0 in | Wt 209.0 lb

## 2018-06-12 DIAGNOSIS — M1000 Idiopathic gout, unspecified site: Secondary | ICD-10-CM

## 2018-06-12 DIAGNOSIS — I2510 Atherosclerotic heart disease of native coronary artery without angina pectoris: Secondary | ICD-10-CM

## 2018-06-12 MED ORDER — METHYLPREDNISOLONE ACETATE 80 MG/ML IJ SUSP
80.0000 mg | Freq: Once | INTRAMUSCULAR | Status: AC
  Administered 2018-06-12: 80 mg via INTRAMUSCULAR

## 2018-06-12 MED ORDER — PREDNISONE 20 MG PO TABS: ORAL_TABLET | ORAL | 0 refills | Status: DC

## 2018-06-12 MED ORDER — HYDROCODONE-ACETAMINOPHEN 5-325 MG PO TABS: 1.0000 | ORAL_TABLET | Freq: Four times a day (QID) | ORAL | 0 refills | Status: DC | PRN

## 2018-06-12 NOTE — Progress Notes (Signed)
Subjective:    Patient ID: James Jones, male    DOB: 17-Nov-1945, 73 y.o.   MRN: [id number]  HPI  Patient reports a sudden onset of pain in his left foot.  Pain is distal to the left ankle and over the dorsal and medial forefoot.  That area is swollen and exquisitely tender to touch.  He does not even want me to pull off his slipper.  He denies any falls or injuries.  The pain began suddenly and without reason.  It is not erythematous but it is warm to the touch.  Barely touching the can elicit severe pain out of proportion to exam consistent with a gout exacerbation.  Patient has a history of gout.  He is tried taking colchicine for the last 2 days with no relief Past Medical History:  Diagnosis Date  . 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 Coronary artery disease, non-occlusive 03/18/2012   60-70% stenosis in RCA -- FFR 0.84; EF 60-65%  . Diabetes mellitus without complication (HCC)   . GERD (gastroesophageal reflux disease)    On PPI  . History of GI bleed January 2015   No obvious findings on EGD/colonoscopy  . Hyperlipidemia   . Hypertension   . OSA (obstructive sleep apnea) 03/18/2012   Doing better with CPAP  . Pulmonary hypertension (HCC) 12/10/2010   ECHO:  Mild PH,mild LVH; PA pressures estimated 30-40 mmHg  . 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 APPENDECTOMY  1974  . CARPAL TUNNEL RELEASE    . EYE SURGERY  1960   Left eye  . HEMORRHOID SURGERY    . LEFT HEART CATH AND CORONARY ANGIOGRAPHY  03/27/2010   Moderate mid RCA lesion,right radial approach,normal EF  . LEFT HEART CATHETERIZATION WITH CORONARY ANGIOGRAM N/A 03/17/2012   Procedure: LEFT HEART CATHETERIZATION WITH CORONARY ANGIOGRAM;  Surgeon: Marykay Lex, MD;  Location: [facility] CATH LAB;  Service: Cardiovascular: Fractional Flow Reserve  Measurement of mid RCA 60-70% stenosis = 0.84. Otherwise mild LCA CAD.  Normal EF & EDP  . SHOULDER ARTHROSCOPY  2006  . TRANSTHORACIC ECHOCARDIOGRAM  12/2010    Mild concentric LVH.  EF> 55%.  GR 1 DD.  Mild aortic sclerosis no stenosis.  PA pressures estimated 30-40 mmHg   Current Outpatient Medications on File Prior to Visit  Medication Sig Dispense Refill  . amLODipine (NORVASC) 5 MG tablet Take 1 tablet (5 mg total) by mouth daily. 90 tablet 3  . Blood Glucose Monitoring Suppl (FREESTYLE LITE) DEVI Use to check blood sugar once daily 1 each 0  . Cholecalciferol (VITAMIN D-3) 1000 UNITS CAPS Take by mouth daily. 2 capsules.    . COLCRYS 0.6 MG tablet TAKE 1 TABLET BY MOUTH 2 TIMES DAILY AS NEEDED (Patient taking differently: Take 0.6 mg by mouth 2 (two) times daily as needed (gout flare up). ) 30 tablet 3  . ferrous sulfate 325 (65 FE) MG tablet Take 1 tablet (325 mg total) by mouth 2 (two) times daily with a meal.  3  . fish oil-omega-3 fatty acids 1000 MG capsule Take 1 g by mouth daily.    Marland Kitchen glucose blood (FREESTYLE LITE) test strip USE TO CHECK BLOOD SUGAR ONCE DAILY. 100 each 12  . metFORMIN (GLUCOPHAGE-XR) 500 MG 24 hr tablet Take 1 tablet (500 mg total) by mouth  daily with breakfast. 90 tablet 3  . Multiple Vitamin (MULTIVITAMIN WITH MINERALS) TABS Take 1 tablet by mouth daily.    . Potassium 99 MG TABS Take 99 mg by mouth daily.     Marland Kitchen PRESCRIPTION MEDICATION Uses C-PAP at bedtime    . rosuvastatin (CRESTOR) 40 MG tablet TAKE 1 TABLET BY MOUTH EVERY DAY (Patient taking differently: Take 40 mg by mouth daily. ) 90 tablet 2  . valsartan-hydrochlorothiazide (DIOVAN-HCT) 320-25 MG tablet TAKE 1 TABLET EVERY DAY 90 tablet 3   No current facility-administered medications on file prior to visit.    No Known Allergies Social History   Socioeconomic History  . Marital status: Married    Spouse name: Not on file  . Number of children: Not on file  . Years of education: Not on file  .  Highest education level: Not on file  Occupational History  . Not on file  Social Needs  . Financial resource strain: Not on file  . Food insecurity:    Worry: Not on file    Inability: Not on file  . Transportation needs:    Medical: Not on file    Non-medical: Not on file  Tobacco Use  . Smoking status: Former Smoker    Packs/day: 1.00    Types: Cigarettes    Last attempt to quit: 04/12/2012    Years since quitting: 6.1  . Smokeless tobacco: Never Used  Substance and Sexual Activity  . Alcohol use: Yes    Alcohol/week: 1.0 standard drinks    Types: 1 Standard drinks or equivalent per week  . Drug use: Yes    Types: Marijuana    Comment: cannibus  . Sexual activity: Not on file  Lifestyle  . Physical activity:    Days per week: Not on file    Minutes per session: Not on file  . Stress: Not on file  Relationships  . Social connections:    Talks on phone: Not on file    Gets together: Not on file    Attends religious service: Not on file    Active member of club or organization: Not on file    Attends meetings of clubs or organizations: Not on file    Relationship status: Not on file  . Intimate partner violence:    Fear of current or ex partner: Not on file    Emotionally abused: Not on file    Physically abused: Not on file    Forced sexual activity: Not on file  Other Topics Concern  . Not on file  Social History Narrative   Married, father of 2, grandfather of 3.   He is a former smoker of about a pack to pack and half cigarettes a day -- he quit in March of this year.    He is an avid exerciser working at least 4-5 days a week doing her walking or stationary bike.      Review of Systems  All other systems reviewed and are negative.      Objective:   Physical Exam  Constitutional: He appears well-developed and well-nourished.  Neck: Neck supple. No JVD present. No thyromegaly present.  Cardiovascular: Normal rate, regular rhythm, normal heart sounds and  intact distal pulses.  No murmur heard. Pulmonary/Chest: Effort normal and breath sounds normal. No respiratory distress. He has no wheezes. He has no rales.  Abdominal: Soft. Bowel sounds are normal. He exhibits no distension and no mass. There is no abdominal tenderness. There is  no rebound and no guarding.  Musculoskeletal:        General: No edema.     Left foot: Decreased range of motion. Tenderness, bony tenderness and swelling present.       Feet:  Lymphadenopathy:    He has no cervical adenopathy.  Vitals reviewed.         Assessment & Plan:  Acute idiopathic gout, unspecified site  80 mg of Depo-Medrol IM x1 now.  Begin prednisone taper pack tomorrow.  Reassess in 1 week or sooner if worse.  Discussed allopurinol at follow-up visit for prevention and also discontinuation of hydrochlorothiazide.

## 2018-06-12 NOTE — Addendum Note (Signed)
Addended by: Legrand Rams B on: 06/12/2018 12:37 PM   Modules accepted: Orders

## 2018-06-19 ENCOUNTER — Ambulatory Visit (INDEPENDENT_AMBULATORY_CARE_PROVIDER_SITE_OTHER): Payer: Medicare Other | Admitting: Family Medicine

## 2018-06-19 ENCOUNTER — Other Ambulatory Visit: Payer: Self-pay

## 2018-06-19 ENCOUNTER — Encounter: Payer: Self-pay | Admitting: Family Medicine

## 2018-06-19 VITALS — BP 152/70 | HR 72 | Temp 98.7°F | Resp 16 | Ht 69.0 in | Wt 208.0 lb

## 2018-06-19 DIAGNOSIS — M1030 Gout due to renal impairment, unspecified site: Secondary | ICD-10-CM

## 2018-06-19 DIAGNOSIS — I2510 Atherosclerotic heart disease of native coronary artery without angina pectoris: Secondary | ICD-10-CM

## 2018-06-19 MED ORDER — ALLOPURINOL 300 MG PO TABS: 300.0000 mg | ORAL_TABLET | Freq: Every day | ORAL | 6 refills | Status: DC

## 2018-06-19 MED ORDER — AMLODIPINE BESYLATE 10 MG PO TABS: 10.0000 mg | ORAL_TABLET | Freq: Every day | ORAL | 3 refills | Status: DC

## 2018-06-19 MED ORDER — VALSARTAN 320 MG PO TABS: 320.0000 mg | ORAL_TABLET | Freq: Every day | ORAL | 3 refills | Status: DC

## 2018-06-19 MED ORDER — COLCRYS 0.6 MG PO TABS: 0.6000 mg | ORAL_TABLET | Freq: Every day | ORAL | 3 refills | Status: DC

## 2018-06-19 NOTE — Progress Notes (Signed)
Subjective:    Patient ID: James Jones, male    DOB: 1945-07-22, 73 y.o.   MRN: [id number]  HPI  06/12/18 Patient reports a sudden onset of pain in his left foot.  Pain is distal to the left ankle and over the dorsal and medial forefoot.  That area is swollen and exquisitely tender to touch.  He does not even want me to pull off his slipper.  He denies any falls or injuries.  The pain began suddenly and without reason.  It is not erythematous but it is warm to the touch.  Barely touching the can elicit severe pain out of proportion to exam consistent with a gout exacerbation.  Patient has a history of gout.  He is tried taking colchicine for the last 2 days with no relief.  At that time, my plan was: 80 mg of Depo-Medrol IM x1 now.  Begin prednisone taper pack tomorrow.  Reassess in 1 week or sooner if worse.  Discussed allopurinol at follow-up visit for prevention and also discontinuation of hydrochlorothiazide.  06/19/18 Patient states the pain in his left ankle and foot have essentially stopped after the prednisone.  He feels back to normal.  He is off his prednisone now completely.  He is here today to discuss gout prevention in the future.  He has a history of upper GI bleed.  He also has a history of chronic kidney disease.  For these 2 reasons we want to avoid NSAIDs.  He is not taking allopurinol.  He is not eating a diet rich in uric acid as well as he knows.  He has never tried allopurinol.  He is on hydrochlorothiazide for his blood pressure. Past Medical History:  Diagnosis Date  . 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 Coronary artery disease, non-occlusive 03/18/2012   60-70% stenosis in RCA -- FFR 0.84; EF 60-65%  . Diabetes mellitus without complication (HCC)   . GERD (gastroesophageal reflux disease)    On PPI  . History of GI bleed January 2015   No obvious findings on EGD/colonoscopy  . Hyperlipidemia   . Hypertension   . OSA (obstructive sleep  apnea) 03/18/2012   Doing better with CPAP  . Pulmonary hypertension (HCC) 12/10/2010   ECHO:  Mild PH,mild LVH; PA pressures estimated 30-40 mmHg  . 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 APPENDECTOMY  1974  . CARPAL TUNNEL RELEASE    . EYE SURGERY  1960   Left eye  . HEMORRHOID SURGERY    . LEFT HEART CATH AND CORONARY ANGIOGRAPHY  03/27/2010   Moderate mid RCA lesion,right radial approach,normal EF  . LEFT HEART CATHETERIZATION WITH CORONARY ANGIOGRAM N/A 03/17/2012   Procedure: LEFT HEART CATHETERIZATION WITH CORONARY ANGIOGRAM;  Surgeon: Marykay Lex, MD;  Location: [facility] CATH LAB;  Service: Cardiovascular: Fractional Flow Reserve Measurement of mid RCA 60-70% stenosis = 0.84. Otherwise mild LCA CAD.  Normal EF & EDP  . SHOULDER ARTHROSCOPY  2006  . TRANSTHORACIC ECHOCARDIOGRAM  12/2010    Mild concentric LVH.  EF> 55%.  GR 1 DD.  Mild aortic sclerosis no stenosis.  PA pressures estimated 30-40 mmHg   Current Outpatient Medications on File Prior to Visit  Medication Sig Dispense Refill  . amLODipine (NORVASC) 5 MG tablet Take 1 tablet (5 mg total) by mouth daily.  90 tablet 3  . Blood Glucose Monitoring Suppl (FREESTYLE LITE) DEVI Use to check blood sugar once daily 1 each 0  . Cholecalciferol (VITAMIN D-3) 1000 UNITS CAPS Take by mouth daily. 2 capsules.    . COLCRYS 0.6 MG tablet TAKE 1 TABLET BY MOUTH 2 TIMES DAILY AS NEEDED (Patient taking differently: Take 0.6 mg by mouth 2 (two) times daily as needed (gout flare up). ) 30 tablet 3  . ferrous sulfate 325 (65 FE) MG tablet Take 1 tablet (325 mg total) by mouth 2 (two) times daily with a meal.  3  . fish oil-omega-3 fatty acids 1000 MG capsule Take 1 g by mouth daily.    Marland Kitchen glucose blood (FREESTYLE LITE) test strip USE TO CHECK BLOOD SUGAR ONCE DAILY. 100 each 12  .  HYDROcodone-acetaminophen (NORCO) 5-325 MG tablet Take 1 tablet by mouth every 6 (six) hours as needed for moderate pain. 30 tablet 0  . metFORMIN (GLUCOPHAGE-XR) 500 MG 24 hr tablet Take 1 tablet (500 mg total) by mouth daily with breakfast. 90 tablet 3  . Multiple Vitamin (MULTIVITAMIN WITH MINERALS) TABS Take 1 tablet by mouth daily.    . Potassium 99 MG TABS Take 99 mg by mouth daily.     Marland Kitchen PRESCRIPTION MEDICATION Uses C-PAP at bedtime    . rosuvastatin (CRESTOR) 40 MG tablet TAKE 1 TABLET BY MOUTH EVERY DAY (Patient taking differently: Take 40 mg by mouth daily. ) 90 tablet 2  . valsartan-hydrochlorothiazide (DIOVAN-HCT) 320-25 MG tablet TAKE 1 TABLET EVERY DAY 90 tablet 3   No current facility-administered medications on file prior to visit.    No Known Allergies Social History   Socioeconomic History  . Marital status: Married    Spouse name: Not on file  . Number of children: Not on file  . Years of education: Not on file  . Highest education level: Not on file  Occupational History  . Not on file  Social Needs  . Financial resource strain: Not on file  . Food insecurity:    Worry: Not on file    Inability: Not on file  . Transportation needs:    Medical: Not on file    Non-medical: Not on file  Tobacco Use  . Smoking status: Former Smoker    Packs/day: 1.00    Types: Cigarettes    Last attempt to quit: 04/12/2012    Years since quitting: 6.1  . Smokeless tobacco: Never Used  Substance and Sexual Activity  . Alcohol use: Yes    Alcohol/week: 1.0 standard drinks    Types: 1 Standard drinks or equivalent per week  . Drug use: Yes    Types: Marijuana    Comment: cannibus  . Sexual activity: Not on file  Lifestyle  . Physical activity:    Days per week: Not on file    Minutes per session: Not on file  . Stress: Not on file  Relationships  . Social connections:    Talks on phone: Not on file    Gets together: Not on file    Attends religious service: Not on file     Active member of club or organization: Not on file    Attends meetings of clubs or organizations: Not on file    Relationship status: Not on file  . Intimate partner violence:    Fear of current or ex partner: Not on file    Emotionally abused: Not on file    Physically abused: Not on file  Forced sexual activity: Not on file  Other Topics Concern  . Not on file  Social History Narrative   Married, father of 2, grandfather of 3.   He is a former smoker of about a pack to pack and half cigarettes a day -- he quit in March of this year.    He is an avid exerciser working at least 4-5 days a week doing her walking or stationary bike.      Review of Systems  All other systems reviewed and are negative.      Objective:   Physical Exam  Constitutional: He appears well-developed and well-nourished.  Neck: Neck supple. No JVD present. No thyromegaly present.  Cardiovascular: Normal rate, regular rhythm, normal heart sounds and intact distal pulses.  No murmur heard. Pulmonary/Chest: Effort normal and breath sounds normal. No respiratory distress. He has no wheezes. He has no rales.  Abdominal: Soft. Bowel sounds are normal. He exhibits no distension and no mass. There is no abdominal tenderness. There is no rebound and no guarding.  Musculoskeletal:        General: No edema.     Left foot: Normal range of motion. No tenderness, bony tenderness or swelling.  Lymphadenopathy:    He has no cervical adenopathy.  Vitals reviewed.         Assessment & Plan:  Acute gout due to renal impairment, unspecified site - Plan: allopurinol (ZYLOPRIM) 300 MG tablet, COLCRYS 0.6 MG tablet, Uric acid, BASIC METABOLIC PANEL WITH GFR  Patient's acute gout flare has improved dramatically.  However patient is having frequent gout attacks now and would like to change his strategy to focus on prevention.  Therefore we will start the patient on allopurinol 300 mg a day.  I will obtain a baseline  uric acid level.  I would like to drive his uric acid level less than 6.  Until his uric acid level has stabilized he will take colchicine 0.6 mg with allopurinol.  He will do this for the next 6 weeks and then return so that we can check his uric acid level.  We will discontinue Diovan HCTZ due to the hydrochlorothiazide component.  We will replace with Diovan 320 mg a day.  Given his elevated blood pressure we will increase his amlodipine from 5 to 10 mg a day and have asked the patient to check his blood pressure daily to make sure that were keeping it less than 140/90.  If not we can add doxazosin.  Reassess lab work in 6 weeks

## 2018-06-20 ENCOUNTER — Other Ambulatory Visit: Payer: Medicare Other

## 2018-06-21 LAB — BASIC METABOLIC PANEL WITH GFR
BUN/Creatinine Ratio: 17 (calc) (ref 6–22)
BUN: 27 mg/dL — ABNORMAL HIGH (ref 7–25)
CO2: 23 mmol/L (ref 20–32)
Calcium: 9.2 mg/dL (ref 8.6–10.3)
Chloride: 102 mmol/L (ref 98–110)
Creat: 1.59 mg/dL — ABNORMAL HIGH (ref 0.70–1.18)
GFR, Est African American: 49 mL/min/{1.73_m2} — ABNORMAL LOW (ref >=60)
GFR, Est Non African American: 42 mL/min/{1.73_m2} — ABNORMAL LOW (ref >=60)
Glucose, Bld: 114 mg/dL — ABNORMAL HIGH (ref 65–99)
Potassium: 4.3 mmol/L (ref 3.5–5.3)
Sodium: 137 mmol/L (ref 135–146)

## 2018-06-21 LAB — URIC ACID: Uric Acid, Serum: 9.3 mg/dL — ABNORMAL HIGH (ref 4.0–8.0)

## 2018-06-26 ENCOUNTER — Other Ambulatory Visit: Payer: Self-pay | Admitting: Family Medicine

## 2018-06-26 DIAGNOSIS — M1030 Gout due to renal impairment, unspecified site: Secondary | ICD-10-CM

## 2018-07-13 ENCOUNTER — Telehealth: Payer: Self-pay | Admitting: Cardiology

## 2018-07-13 NOTE — Telephone Encounter (Signed)
New Message     Trinna Post is sending a second fax and says we should receive it in 24 hours and asks that it be checked over by Dr Tresa Endo

## 2018-07-14 NOTE — Telephone Encounter (Signed)
Called and spoke [facility]- advised that it was RX script for cpap supplies, advised that Dr.Kelly would be back into office on June 16th- and would sign then.   They placed note and was made aware, no further questions.

## 2018-07-17 ENCOUNTER — Other Ambulatory Visit: Payer: Self-pay | Admitting: Family Medicine

## 2018-07-17 DIAGNOSIS — M1030 Gout due to renal impairment, unspecified site: Secondary | ICD-10-CM

## 2018-07-28 ENCOUNTER — Other Ambulatory Visit: Payer: Self-pay | Admitting: Family Medicine

## 2018-07-28 DIAGNOSIS — M1030 Gout due to renal impairment, unspecified site: Secondary | ICD-10-CM

## 2018-08-02 ENCOUNTER — Ambulatory Visit (INDEPENDENT_AMBULATORY_CARE_PROVIDER_SITE_OTHER): Payer: Medicare Other | Admitting: Family Medicine

## 2018-08-02 ENCOUNTER — Other Ambulatory Visit: Payer: Medicare Other

## 2018-08-02 ENCOUNTER — Encounter: Payer: Self-pay | Admitting: Family Medicine

## 2018-08-02 ENCOUNTER — Other Ambulatory Visit: Payer: Self-pay

## 2018-08-02 VITALS — BP 160/68 | HR 84 | Temp 98.7°F | Resp 16 | Ht 69.0 in | Wt 205.0 lb

## 2018-08-02 DIAGNOSIS — I10 Essential (primary) hypertension: Secondary | ICD-10-CM

## 2018-08-02 DIAGNOSIS — E1159 Type 2 diabetes mellitus with other circulatory complications: Secondary | ICD-10-CM

## 2018-08-02 DIAGNOSIS — M1009 Idiopathic gout, multiple sites: Secondary | ICD-10-CM

## 2018-08-02 DIAGNOSIS — I2510 Atherosclerotic heart disease of native coronary artery without angina pectoris: Secondary | ICD-10-CM

## 2018-08-02 MED ORDER — METHYLPREDNISOLONE ACETATE 80 MG/ML IJ SUSP
80.0000 mg | Freq: Once | INTRAMUSCULAR | Status: AC
  Administered 2018-08-02: 80 mg via INTRAMUSCULAR

## 2018-08-02 MED ORDER — PREDNISONE 20 MG PO TABS: ORAL_TABLET | ORAL | 0 refills | Status: DC

## 2018-08-02 MED ORDER — AMLODIPINE BESYLATE 10 MG PO TABS: 10.0000 mg | ORAL_TABLET | Freq: Every day | ORAL | 3 refills | Status: DC

## 2018-08-02 NOTE — Patient Instructions (Addendum)
Start prednisone taper tomorrow Continue allopurinol and colchicine once a day  Get the new norvasc (amlodipine ) 10mg  from the pharmacy  We will call with lab results F/U Monday July 6th with Dr. Dennard Schaumann

## 2018-08-02 NOTE — Assessment & Plan Note (Signed)
Uncontrolled, increase norvasc to 10mg  per PCP recommendations as off HCTZ in setting of gout Continue diovan Fasting labs obtained

## 2018-08-02 NOTE — Progress Notes (Signed)
Subjective:    Patient ID: James CanalesJames Buchta, male    DOB: 12-Dec-1945, 73 y.o.   MRN: [id number]  Patient presents for Gout (flare to L knee and L foot- is taking allopurinol and Colcrys without relief) and Hypertension (has only been taking Norvasc 5mg  instead of increasing like PCP recommended on 5/11)  Patient here with recurrent gout flare.  He has been treated for gout multiple times in the past couple months.  I reviewed his PCPs last note.  He was started on allopurinol last month at 300 mg.  His uric acid was elevated at 9.3.  He had already been on prednisone taper.  He was also given colchicine daily to prevent flare but unfortunately this weekend he started back with swelling in his left foot yesterday went into his left knee today he feels some aching in the right knee.  He states he has a longstanding history of gout.  He denies any recent alcohol use but states that he drinks of beer every couple of weeks.  He is also stopped eating shellfish and red meat so he is not sure what may have triggered it this weekend.      HTN- has been taking diovan as well as amlodipine 5 mg.  He did not realize his amlodipine was placed go up to 10 mg discussed this with him in the setting of stopping hydrochlorothiazide as this can precipitate gout Note he has not had his blood pressure medication this morning.  Diabetes mellitus he is due for recheck on his A1c typically well controlled last 6.3% he is on metformin 500 mg once a day.  CAD he is taking Crestor      Review Of Systems:  GEN- denies fatigue, fever, weight loss,weakness, recent illness HEENT- denies eye drainage, change in vision, nasal discharge, CVS- denies chest pain, palpitations RESP- denies SOB, cough, wheeze ABD- denies N/V, change in stools, abd pain GU- denies dysuria, hematuria, dribbling, incontinence MSK- + joint pain, muscle aches, injury Neuro- denies headache, dizziness, syncope, seizure activity       Objective:     BP (!) 160/68 (BP Location: Right Arm, Patient Position: Sitting, Cuff Size: Normal)   Pulse 84   Temp 98.7 F (37.1 C) (Oral)   Resp 16   Ht 5\' 9"  (1.753 m)   Wt 205 lb (93 kg)   SpO2 98%   BMI 30.27 kg/m  GEN- NAD, alert and oriented x3 HEENT- PERRL, EOMI, non injected sclera, pink conjunctiva, MMM, oropharynx clear CVS- RRR, no murmur RESP-CTAB MSK- swelling and warmth, tenderness left foot- entire foot , no swelling of ankle, swelling of left knee, decreased ROM knee, walking with crutch, mild ant swelling of right knee, fair ROM, no warmth right knee Pulses- Radial 2+ DP- diminished left foot        Assessment & Plan:      Problem List Items Addressed This Visit      Unprioritized   CAD (coronary artery disease), with 60-70% stenosis in RCA (Chronic)    On Crestor, followed by cardiology      Relevant Medications   amLODipine (NORVASC) 10 MG tablet   Controlled diabetes mellitus with circulatory complication, without long-term current use of insulin (HCC) (Chronic)    Well controlled Advised blood sugar will go up with prednisone taper      Relevant Orders   Hemoglobin A1c   Lipid panel   Essential hypertension (Chronic)    Uncontrolled, increase norvasc to 10mg  per PCP  recommendations as off HCTZ in setting of gout Continue diovan Fasting labs obtained      Relevant Medications   amLODipine (NORVASC) 10 MG tablet   Other Relevant Orders   CBC with Differential/Platelet   Comprehensive metabolic panel   Lipid panel    Other Visit Diagnoses    Acute idiopathic gout of multiple sites    -  Primary   Worsening gout, likely rebound flares although he was on prophylactic colchicine. Last Uric acid  9.3%. COntinue allopurinol 300mg  , labs done today,  will give him prednisone taper as this did work, also given Depo Medrol 80mg  in the office  Recheck BP with PCP in 1-2 weeks    Relevant Medications   methylPREDNISolone acetate (DEPO-MEDROL) injection 80  mg (Completed)   Other Relevant Orders   Uric Acid      Note: This dictation was prepared with Dragon dictation along with smaller phrase technology. Any transcriptional errors that result from this process are unintentional.

## 2018-08-02 NOTE — Assessment & Plan Note (Signed)
On Crestor, followed by cardiology

## 2018-08-02 NOTE — Assessment & Plan Note (Signed)
Well controlled Advised blood sugar will go up with prednisone taper

## 2018-08-03 ENCOUNTER — Other Ambulatory Visit: Payer: Self-pay | Admitting: Family Medicine

## 2018-08-03 DIAGNOSIS — M1030 Gout due to renal impairment, unspecified site: Secondary | ICD-10-CM

## 2018-08-03 MED ORDER — COLCRYS 0.6 MG PO TABS: 0.6000 mg | ORAL_TABLET | ORAL | 3 refills | Status: DC | PRN

## 2018-08-03 MED ORDER — ALLOPURINOL 100 MG PO TABS: 100.0000 mg | ORAL_TABLET | Freq: Every day | ORAL | 6 refills | Status: DC

## 2018-08-04 LAB — LIPID PANEL
Cholesterol: 252 mg/dL — ABNORMAL HIGH (ref <200)
HDL: 74 mg/dL (ref >=40)
LDL Cholesterol (Calc): 157 mg/dL (calc) — ABNORMAL HIGH
Non-HDL Cholesterol (Calc): 178 mg/dL (calc) — ABNORMAL HIGH (ref <130)
Total CHOL/HDL Ratio: 3.4 (calc) (ref <5.0)
Triglycerides: 100 mg/dL (ref <150)

## 2018-08-04 LAB — URIC ACID: Uric Acid, Serum: 3.6 mg/dL — ABNORMAL LOW (ref 4.0–8.0)

## 2018-08-04 LAB — COMPREHENSIVE METABOLIC PANEL
AG Ratio: 1.6 (calc) (ref 1.0–2.5)
ALT: 12 U/L (ref 9–46)
AST: 18 U/L (ref 10–35)
Albumin: 4.1 g/dL (ref 3.6–5.1)
Alkaline phosphatase (APISO): 68 U/L (ref 35–144)
BUN/Creatinine Ratio: 13 (calc) (ref 6–22)
BUN: 23 mg/dL (ref 7–25)
CO2: 24 mmol/L (ref 20–32)
Calcium: 9.5 mg/dL (ref 8.6–10.3)
Chloride: 103 mmol/L (ref 98–110)
Creat: 1.82 mg/dL — ABNORMAL HIGH (ref 0.70–1.18)
Globulin: 2.5 g/dL (calc) (ref 1.9–3.7)
Glucose, Bld: 136 mg/dL — ABNORMAL HIGH (ref 65–99)
Potassium: 4.3 mmol/L (ref 3.5–5.3)
Sodium: 137 mmol/L (ref 135–146)
Total Bilirubin: 0.6 mg/dL (ref 0.2–1.2)
Total Protein: 6.6 g/dL (ref 6.1–8.1)

## 2018-08-04 LAB — CBC WITH DIFFERENTIAL/PLATELET
Absolute Monocytes: 1123 cells/uL — ABNORMAL HIGH (ref 200–950)
Basophils Absolute: 55 cells/uL (ref 0–200)
Basophils Relative: 0.5 %
Eosinophils Absolute: 55 cells/uL (ref 15–500)
Eosinophils Relative: 0.5 %
HCT: 36 % — ABNORMAL LOW (ref 38.5–50.0)
Hemoglobin: 12 g/dL — ABNORMAL LOW (ref 13.2–17.1)
Lymphs Abs: 1003 cells/uL (ref 850–3900)
MCH: 28.4 pg (ref 27.0–33.0)
MCHC: 33.3 g/dL (ref 32.0–36.0)
MCV: 85.3 fL (ref 80.0–100.0)
MPV: 9.8 fL (ref 7.5–12.5)
Monocytes Relative: 10.3 %
Neutro Abs: 8666 cells/uL — ABNORMAL HIGH (ref 1500–7800)
Neutrophils Relative %: 79.5 %
Platelets: 291 10*3/uL (ref 140–400)
RBC: 4.22 10*6/uL (ref 4.20–5.80)
RDW: 14.6 % (ref 11.0–15.0)
Total Lymphocyte: 9.2 %
WBC: 10.9 10*3/uL — ABNORMAL HIGH (ref 3.8–10.8)

## 2018-08-04 LAB — HEMOGLOBIN A1C
Hgb A1c MFr Bld: 6.5 % of total Hgb — ABNORMAL HIGH (ref <5.7)
Mean Plasma Glucose: 140 (calc)
eAG (mmol/L): 7.7 (calc)

## 2018-08-07 ENCOUNTER — Ambulatory Visit: Payer: Medicare Other | Admitting: Family Medicine

## 2018-08-14 ENCOUNTER — Ambulatory Visit: Payer: Medicare Other | Admitting: Family Medicine

## 2018-08-17 ENCOUNTER — Other Ambulatory Visit: Payer: Self-pay

## 2018-08-17 ENCOUNTER — Encounter: Payer: Self-pay | Admitting: Family Medicine

## 2018-08-17 ENCOUNTER — Ambulatory Visit (INDEPENDENT_AMBULATORY_CARE_PROVIDER_SITE_OTHER): Payer: Medicare Other | Admitting: Family Medicine

## 2018-08-17 VITALS — BP 138/70 | HR 72 | Temp 98.8°F | Resp 14 | Ht 69.0 in | Wt 205.0 lb

## 2018-08-17 DIAGNOSIS — E1122 Type 2 diabetes mellitus with diabetic chronic kidney disease: Secondary | ICD-10-CM | POA: Diagnosis not present

## 2018-08-17 DIAGNOSIS — N183 Chronic kidney disease, stage 3 (moderate): Secondary | ICD-10-CM | POA: Diagnosis not present

## 2018-08-17 DIAGNOSIS — I2510 Atherosclerotic heart disease of native coronary artery without angina pectoris: Secondary | ICD-10-CM | POA: Diagnosis not present

## 2018-08-17 DIAGNOSIS — E1159 Type 2 diabetes mellitus with other circulatory complications: Secondary | ICD-10-CM

## 2018-08-17 DIAGNOSIS — M1009 Idiopathic gout, multiple sites: Secondary | ICD-10-CM | POA: Diagnosis not present

## 2018-08-17 MED ORDER — SITAGLIPTIN PHOSPHATE 100 MG PO TABS: 100.0000 mg | ORAL_TABLET | Freq: Every day | ORAL | 5 refills | Status: DC

## 2018-08-17 MED ORDER — PREDNISONE 20 MG PO TABS: ORAL_TABLET | ORAL | 0 refills | Status: DC

## 2018-08-17 NOTE — Progress Notes (Signed)
Subjective:    Patient ID: James Jones, male    DOB: 19-Jun-1945, 73 y.o.   MRN: [id number]  HPI  06/12/18 Patient reports a sudden onset of pain in his left foot.  Pain is distal to the left ankle and over the dorsal and medial forefoot.  That area is swollen and exquisitely tender to touch.  He does not even want me to pull off his slipper.  He denies any falls or injuries.  The pain began suddenly and without reason.  It is not erythematous but it is warm to the touch.  Barely touching the can elicit severe pain out of proportion to exam consistent with a gout exacerbation.  Patient has a history of gout.  He is tried taking colchicine for the last 2 days with no relief.  At that time, my plan was: 80 mg of Depo-Medrol IM x1 now.  Begin prednisone taper pack tomorrow.  Reassess in 1 week or sooner if worse.  Discussed allopurinol at follow-up visit for prevention and also discontinuation of hydrochlorothiazide.  06/19/18 Patient states the pain in his left ankle and foot have essentially stopped after the prednisone.  He feels back to normal.  He is off his prednisone now completely.  He is here today to discuss gout prevention in the future.  He has a history of upper GI bleed.  He also has a history of chronic kidney disease.  For these 2 reasons we want to avoid NSAIDs.  He is not taking allopurinol.  He is not eating a diet rich in uric acid as well as he knows.  He has never tried allopurinol.  He is on hydrochlorothiazide for his blood pressure.  At that time, my plan was: Patient's acute gout flare has improved dramatically.  However patient is having frequent gout attacks now and would like to change his strategy to focus on prevention.  Therefore we will start the patient on allopurinol 300 mg a day.  I will obtain a baseline uric acid level.  I would like to drive his uric acid level less than 6.  Until his uric acid level has stabilized he will take colchicine 0.6 mg with allopurinol.  He  will do this for the next 6 weeks and then return so that we can check his uric acid level.  We will discontinue Diovan HCTZ due to the hydrochlorothiazide component.  We will replace with Diovan 320 mg a day.  Given his elevated blood pressure we will increase his amlodipine from 5 to 10 mg a day and have asked the patient to check his blood pressure daily to make sure that were keeping it less than 140/90.  If not we can add doxazosin.  Reassess lab work in 6 weeks  08/17/18 Office Visit on 08/02/2018  Component Date Value Ref Range Status  . WBC 08/02/2018 10.9* 3.8 - 10.8 Thousand/uL Final  . RBC 08/02/2018 4.22  4.20 - 5.80 Million/uL Final  . Hemoglobin 08/02/2018 12.0* 13.2 - 17.1 g/dL Final  . HCT 86/57/846906/24/2020 36.0* 38.5 - 50.0 % Final  . MCV 08/02/2018 85.3  80.0 - 100.0 fL Final  . MCH 08/02/2018 28.4  27.0 - 33.0 pg Final  . MCHC 08/02/2018 33.3  32.0 - 36.0 g/dL Final  . RDW 62/95/284106/24/2020 14.6  11.0 - 15.0 % Final  . Platelets 08/02/2018 291  140 - 400 Thousand/uL Final  . MPV 08/02/2018 9.8  7.5 - 12.5 fL Final  . Neutro Abs 08/02/2018 8,666* 1,500 -  7,800 cells/uL Final  . Lymphs Abs 08/02/2018 1,003  850 - 3,900 cells/uL Final  . Absolute Monocytes 08/02/2018 1,123* 200 - 950 cells/uL Final  . Eosinophils Absolute 08/02/2018 55  15 - 500 cells/uL Final  . Basophils Absolute 08/02/2018 55  0 - 200 cells/uL Final  . Neutrophils Relative % 08/02/2018 79.5  % Final  . Total Lymphocyte 08/02/2018 9.2  % Final  . Monocytes Relative 08/02/2018 10.3  % Final  . Eosinophils Relative 08/02/2018 0.5  % Final  . Basophils Relative 08/02/2018 0.5  % Final  . Glucose, Bld 08/02/2018 136* 65 - 99 mg/dL Final   Comment: .            Fasting reference interval . For someone without known diabetes, a glucose value >125 mg/dL indicates that they may have diabetes and this should be confirmed with a follow-up test. .   . BUN 08/02/2018 23  7 - 25 mg/dL Final  . Creat 45/40/981106/24/2020 1.82* 0.70 -  1.18 mg/dL Final   Comment: For patients >75 years of age, the reference limit for Creatinine is approximately 13% higher for people identified as African-American. .   [name]. BUN/Creatinine Ratio 08/02/2018 13  6 - 22 (calc) Final  . Sodium 08/02/2018 137  135 - 146 mmol/L Final  . Potassium 08/02/2018 4.3  3.5 - 5.3 mmol/L Final  . Chloride 08/02/2018 103  98 - 110 mmol/L Final  . CO2 08/02/2018 24  20 - 32 mmol/L Final  . Calcium 08/02/2018 9.5  8.6 - 10.3 mg/dL Final  . Total Protein 08/02/2018 6.6  6.1 - 8.1 g/dL Final  . Albumin 91/47/829506/24/2020 4.1  3.6 - 5.1 g/dL Final  . Globulin 62/13/086506/24/2020 2.5  1.9 - 3.7 g/dL (calc) Final  . AG Ratio 08/02/2018 1.6  1.0 - 2.5 (calc) Final  . Total Bilirubin 08/02/2018 0.6  0.2 - 1.2 mg/dL Final  . Alkaline phosphatase (APISO) 08/02/2018 68  35 - 144 U/L Final  . AST 08/02/2018 18  10 - 35 U/L Final  . ALT 08/02/2018 12  9 - 46 U/L Final  . Hgb A1c MFr Bld 08/02/2018 6.5* <5.7 % of total Hgb Final   Comment: For someone without known diabetes, a hemoglobin A1c value of 6.5% or greater indicates that they may have  diabetes and this should be confirmed with a follow-up  test. . For someone with known diabetes, a value <7% indicates  that their diabetes is well controlled and a value  greater than or equal to 7% indicates suboptimal  control. A1c targets should be individualized based on  duration of diabetes, age, comorbid conditions, and  other considerations. . Currently, no consensus exists regarding use of hemoglobin A1c for diagnosis of diabetes for children. .   . Mean Plasma Glucose 08/02/2018 140  (calc) Final  . eAG (mmol/L) 08/02/2018 7.7  (calc) Final  . Cholesterol 08/02/2018 252* <200 mg/dL Final  . HDL 78/46/962906/24/2020 74  > OR = 40 mg/dL Final  . Triglycerides 08/02/2018 100  <150 mg/dL Final  . LDL Cholesterol (Calc) 08/02/2018 157* mg/dL (calc) Final   Comment: Reference range: <100 . Desirable range <100 mg/dL for primary  prevention;   <70 mg/dL for patients with CHD or diabetic patients  with > or = 2 CHD risk factors. Marland Kitchen. LDL-C is now calculated using the Martin-Hopkins  calculation, which is a validated novel method providing  better accuracy than the Friedewald equation in the  estimation of LDL-C.  Daphine DeutscherMartin  SS et al. JAMA. 6010;932(35): 2061-2068  (http://education.[facility].com/faq/FAQ164)   . Total CHOL/HDL Ratio 08/02/2018 3.4  <5.0 (calc) Final  . Non-HDL Cholesterol (Calc) 08/02/2018 178* <130 mg/dL (calc) Final   Comment: For patients with diabetes plus 1 major ASCVD risk  factor, treating to a non-HDL-C goal of <100 mg/dL  (LDL-C of <70 mg/dL) is considered a therapeutic  option.   . Uric Acid, Serum 08/02/2018 3.6* 4.0 - 8.0 mg/dL Final   Comment: Therapeutic target for gout patients: <6.0 mg/dL .    Uric acid had dropped substantially down to 3.6.  However renal function had deteriorated slightly and creatinine was up to 1.8.  For that reason I decreased his allopurinol from 300 mg a day to 100 mg a day.  I also feel that is no longer wise for him to take metformin so we asked the patient to discontinue the metformin.  His hemoglobin A1c was well controlled at 6.5 however now that he is off the metformin this will likely rise to 7.5 and therefore he is here today to discuss treatment options for his diabetes.  He also complains of pain now in his right foot.  He has pain in the dorsal midfoot as well as pain along the Achilles tendon and the insertion of the Achilles tendon on his posterior calcaneus.  He states that this began after his most recent gout flare.  Is mildly sore but he is unable to take NSAIDs due to his chronic kidney disease.  He has had the pain approximately 1 week Past Medical History:  Diagnosis Date  . 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 Coronary artery disease, non-occlusive 03/18/2012   60-70% stenosis in RCA -- FFR 0.84; EF 60-65%  .  Diabetes mellitus without complication ([place])   . GERD (gastroesophageal reflux disease)    On PPI  . History of GI bleed January 2015   No obvious findings on EGD/colonoscopy  . Hyperlipidemia   . Hypertension   . OSA (obstructive sleep apnea) 03/18/2012   Doing better with CPAP  . Pulmonary hypertension ([place]) 12/10/2010   ECHO:  Mild PH,mild LVH; PA pressures estimated 30-40 mmHg  . 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 APPENDECTOMY  1974  . CARPAL TUNNEL RELEASE    . EYE SURGERY  1960   Left eye  . HEMORRHOID SURGERY    . LEFT HEART CATH AND CORONARY ANGIOGRAPHY  03/27/2010   Moderate mid RCA lesion,right radial approach,normal EF  . LEFT HEART CATHETERIZATION WITH CORONARY ANGIOGRAM N/A 03/17/2012   Procedure: LEFT HEART CATHETERIZATION WITH CORONARY ANGIOGRAM;  Surgeon: Leonie Man, MD;  Location: [facility] CATH LAB;  Service: Cardiovascular: Fractional Flow Reserve Measurement of mid RCA 60-70% stenosis = 0.84. Otherwise mild LCA CAD.  Normal EF & EDP  . SHOULDER ARTHROSCOPY  2006  . TRANSTHORACIC ECHOCARDIOGRAM  12/2010    Mild concentric LVH.  EF> 55%.  GR 1 DD.  Mild aortic sclerosis no stenosis.  PA pressures estimated 30-40 mmHg   Current Outpatient Medications on File Prior to Visit  Medication Sig Dispense Refill  . allopurinol (ZYLOPRIM) 100 MG tablet Take 1 tablet (100 mg total) by mouth daily. 30 tablet 6  . amLODipine (NORVASC) 10 MG tablet Take 1 tablet (10 mg total) by mouth daily. 90 tablet 3  . Blood Glucose  Monitoring Suppl (FREESTYLE LITE) DEVI Use to check blood sugar once daily 1 each 0  . Cholecalciferol (VITAMIN D-3) 1000 UNITS CAPS Take by mouth daily. 2 capsules.    . COLCRYS 0.6 MG tablet Take 1 tablet (0.6 mg total) by mouth as needed (for Gout). 30 tablet 3  . ferrous sulfate 325 (65 FE) MG tablet Take 1 tablet  (325 mg total) by mouth 2 (two) times daily with a meal.  3  . fish oil-omega-3 fatty acids 1000 MG capsule Take 1 g by mouth daily.    Marland Kitchen. glucose blood (FREESTYLE LITE) test strip USE TO CHECK BLOOD SUGAR ONCE DAILY. 100 each 12  . HYDROcodone-acetaminophen (NORCO) 5-325 MG tablet Take 1 tablet by mouth every 6 (six) hours as needed for moderate pain. 30 tablet 0  . Multiple Vitamin (MULTIVITAMIN WITH MINERALS) TABS Take 1 tablet by mouth daily.    . Potassium 99 MG TABS Take 99 mg by mouth daily.     Marland Kitchen. PRESCRIPTION MEDICATION Uses C-PAP at bedtime    . rosuvastatin (CRESTOR) 40 MG tablet TAKE 1 TABLET BY MOUTH EVERY DAY (Patient taking differently: Take 40 mg by mouth daily. ) 90 tablet 2  . valsartan (DIOVAN) 320 MG tablet Take 1 tablet (320 mg total) by mouth daily. 90 tablet 3   No current facility-administered medications on file prior to visit.    No Known Allergies Social History   Socioeconomic History  . Marital status: Married    Spouse name: Not on file  . Number of children: Not on file  . Years of education: Not on file  . Highest education level: Not on file  Occupational History  . Not on file  Social Needs  . Financial resource strain: Not on file  . Food insecurity    Worry: Not on file    Inability: Not on file  . Transportation needs    Medical: Not on file    Non-medical: Not on file  Tobacco Use  . Smoking status: Former Smoker    Packs/day: 1.00    Types: Cigarettes    Quit date: 04/12/2012    Years since quitting: 6.3  . Smokeless tobacco: Never Used  Substance and Sexual Activity  . Alcohol use: Yes    Alcohol/week: 1.0 standard drinks    Types: 1 Standard drinks or equivalent per week  . Drug use: Yes    Types: Marijuana    Comment: cannibus  . Sexual activity: Not on file  Lifestyle  . Physical activity    Days per week: Not on file    Minutes per session: Not on file  . Stress: Not on file  Relationships  . Social Musicianconnections    Talks on  phone: Not on file    Gets together: Not on file    Attends religious service: Not on file    Active member of club or organization: Not on file    Attends meetings of clubs or organizations: Not on file    Relationship status: Not on file  . Intimate partner violence    Fear of current or ex partner: Not on file    Emotionally abused: Not on file    Physically abused: Not on file    Forced sexual activity: Not on file  Other Topics Concern  . Not on file  Social History Narrative   Married, father of 2, grandfather of 3.   He is a former smoker of about a pack to pack  and half cigarettes a day -- he quit in March of this year.    He is an avid exerciser working at least 4-5 days a week doing her walking or stationary bike.      Review of Systems  All other systems reviewed and are negative.      Objective:   Physical Exam  Constitutional: He appears well-developed and well-nourished.  Neck: Neck supple. No JVD present. No thyromegaly present.  Cardiovascular: Normal rate, regular rhythm, normal heart sounds and intact distal pulses.  No murmur heard. Pulmonary/Chest: Effort normal and breath sounds normal. No respiratory distress. He has no wheezes. He has no rales.  Abdominal: Soft. Bowel sounds are normal. He exhibits no distension and no mass. There is no abdominal tenderness. There is no rebound and no guarding.  Musculoskeletal:        General: No edema.     Right foot: Tenderness and bony tenderness present. No swelling, crepitus or deformity.     Left foot: Normal range of motion. No tenderness, bony tenderness or swelling.       Feet:  Lymphadenopathy:    He has no cervical adenopathy.  Vitals reviewed.         Assessment & Plan:  1. Controlled type 2 diabetes mellitus with other circulatory complication, without long-term current use of insulin (HCC) Discontinue metformin due to worsening renal function and replace with Januvia 100 mg a day.  Recheck lab  work in 3 to 6 months  2. Acute idiopathic gout of multiple sites Gout flare has resolved.  Continue allopurinol 100 mg a day and recheck uric acid in 6 months  3. CKD stage 3 due to type 2 diabetes mellitus (HCC) Due to declining renal function avoid NSAIDs.  Diabetes is well controlled.  Switch to Januvia 100 mg a day  I believe the pain in his right heel is likely Achilles tendinitis.  We will treat this with prednisone taper pack.  Reassess if no better in 1 week.

## 2018-09-08 ENCOUNTER — Encounter: Payer: Self-pay | Admitting: Family Medicine

## 2018-09-08 ENCOUNTER — Ambulatory Visit (INDEPENDENT_AMBULATORY_CARE_PROVIDER_SITE_OTHER): Payer: Medicare Other | Admitting: Family Medicine

## 2018-09-08 ENCOUNTER — Other Ambulatory Visit: Payer: Self-pay

## 2018-09-08 VITALS — BP 136/70 | HR 76 | Temp 99.2°F | Resp 18 | Ht 69.0 in | Wt 204.0 lb

## 2018-09-08 DIAGNOSIS — M2550 Pain in unspecified joint: Secondary | ICD-10-CM

## 2018-09-08 DIAGNOSIS — I2510 Atherosclerotic heart disease of native coronary artery without angina pectoris: Secondary | ICD-10-CM | POA: Diagnosis not present

## 2018-09-08 MED ORDER — ROSUVASTATIN CALCIUM 10 MG PO TABS: 10.0000 mg | ORAL_TABLET | Freq: Every day | ORAL | 3 refills | Status: DC

## 2018-09-08 MED ORDER — COLCHICINE 0.6 MG PO TABS: 0.6000 mg | ORAL_TABLET | Freq: Every day | ORAL | 2 refills | Status: DC

## 2018-09-08 MED ORDER — PREDNISONE 20 MG PO TABS: ORAL_TABLET | ORAL | 0 refills | Status: DC

## 2018-09-08 NOTE — Progress Notes (Signed)
Subjective:    Patient ID: James Jones, male    DOB: 1945-07-28, 73 y.o.   MRN: [id number]  HPI  06/12/18 Patient reports a sudden onset of pain in his left foot.  Pain is distal to the left ankle and over the dorsal and medial forefoot.  That area is swollen and exquisitely tender to touch.  He does not even want me to pull off his slipper.  He denies any falls or injuries.  The pain began suddenly and without reason.  It is not erythematous but it is warm to the touch.  Barely touching the can elicit severe pain out of proportion to exam consistent with a gout exacerbation.  Patient has a history of gout.  He is tried taking colchicine for the last 2 days with no relief.  At that time, my plan was: 80 mg of Depo-Medrol IM x1 now.  Begin prednisone taper pack tomorrow.  Reassess in 1 week or sooner if worse.  Discussed allopurinol at follow-up visit for prevention and also discontinuation of hydrochlorothiazide.  06/19/18 Patient states the pain in his left ankle and foot have essentially stopped after the prednisone.  He feels back to normal.  He is off his prednisone now completely.  He is here today to discuss gout prevention in the future.  He has a history of upper GI bleed.  He also has a history of chronic kidney disease.  For these 2 reasons we want to avoid NSAIDs.  He is not taking allopurinol.  He is not eating a diet rich in uric acid as well as he knows.  He has never tried allopurinol.  He is on hydrochlorothiazide for his blood pressure.  At that time, my plan was: Patient's acute gout flare has improved dramatically.  However patient is having frequent gout attacks now and would like to change his strategy to focus on prevention.  Therefore we will start the patient on allopurinol 300 mg a day.  I will obtain a baseline uric acid level.  I would like to drive his uric acid level less than 6.  Until his uric acid level has stabilized he will take colchicine 0.6 mg with allopurinol.  He  will do this for the next 6 weeks and then return so that we can check his uric acid level.  We will discontinue Diovan HCTZ due to the hydrochlorothiazide component.  We will replace with Diovan 320 mg a day.  Given his elevated blood pressure we will increase his amlodipine from 5 to 10 mg a day and have asked the patient to check his blood pressure daily to make sure that were keeping it less than 140/90.  If not we can add doxazosin.  Reassess lab work in 6 weeks  08/17/18 No visits with results within 1 Month(s) from this visit.  Latest known visit with results is:  Office Visit on 08/02/2018  Component Date Value Ref Range Status   WBC 08/02/2018 10.9* 3.8 - 10.8 Thousand/uL Final   RBC 08/02/2018 4.22  4.20 - 5.80 Million/uL Final   Hemoglobin 08/02/2018 12.0* 13.2 - 17.1 g/dL Final   HCT 86/57/846906/24/2020 36.0* 38.5 - 50.0 % Final   MCV 08/02/2018 85.3  80.0 - 100.0 fL Final   MCH 08/02/2018 28.4  27.0 - 33.0 pg Final   MCHC 08/02/2018 33.3  32.0 - 36.0 g/dL Final   RDW 62/95/284106/24/2020 14.6  11.0 - 15.0 % Final   Platelets 08/02/2018 291  140 - 400 Thousand/uL Final  MPV 08/02/2018 9.8  7.5 - 12.5 fL Final   Neutro Abs 08/02/2018 8,666* 1,500 - 7,800 cells/uL Final   Lymphs Abs 08/02/2018 1,003  850 - 3,900 cells/uL Final   Absolute Monocytes 08/02/2018 1,123* 200 - 950 cells/uL Final   Eosinophils Absolute 08/02/2018 55  15 - 500 cells/uL Final   Basophils Absolute 08/02/2018 55  0 - 200 cells/uL Final   Neutrophils Relative % 08/02/2018 79.5  % Final   Total Lymphocyte 08/02/2018 9.2  % Final   Monocytes Relative 08/02/2018 10.3  % Final   Eosinophils Relative 08/02/2018 0.5  % Final   Basophils Relative 08/02/2018 0.5  % Final   Glucose, Bld 08/02/2018 136* 65 - 99 mg/dL Final   Comment: .            Fasting reference interval . For someone without known diabetes, a glucose value >125 mg/dL indicates that they may have diabetes and this should be confirmed with  a follow-up test. .    BUN 08/02/2018 23  7 - 25 mg/dL Final   Creat 16/10/960406/24/2020 1.82* 0.70 - 1.18 mg/dL Final   Comment: For patients >75 years of age, the reference limit for Creatinine is approximately 13% higher for people identified as African-American. .    BUN/Creatinine Ratio 08/02/2018 13  6 - 22 (calc) Final   Sodium 08/02/2018 137  135 - 146 mmol/L Final   Potassium 08/02/2018 4.3  3.5 - 5.3 mmol/L Final   Chloride 08/02/2018 103  98 - 110 mmol/L Final   CO2 08/02/2018 24  20 - 32 mmol/L Final   Calcium 08/02/2018 9.5  8.6 - 10.3 mg/dL Final   Total Protein 54/09/811906/24/2020 6.6  6.1 - 8.1 g/dL Final   Albumin 14/78/295606/24/2020 4.1  3.6 - 5.1 g/dL Final   Globulin 21/30/865706/24/2020 2.5  1.9 - 3.7 g/dL (calc) Final   AG Ratio 08/02/2018 1.6  1.0 - 2.5 (calc) Final   Total Bilirubin 08/02/2018 0.6  0.2 - 1.2 mg/dL Final   Alkaline phosphatase (APISO) 08/02/2018 68  35 - 144 U/L Final   AST 08/02/2018 18  10 - 35 U/L Final   ALT 08/02/2018 12  9 - 46 U/L Final   Hgb A1c MFr Bld 08/02/2018 6.5* <5.7 % of total Hgb Final   Comment: For someone without known diabetes, a hemoglobin A1c value of 6.5% or greater indicates that they may have  diabetes and this should be confirmed with a follow-up  test. . For someone with known diabetes, a value <7% indicates  that their diabetes is well controlled and a value  greater than or equal to 7% indicates suboptimal  control. A1c targets should be individualized based on  duration of diabetes, age, comorbid conditions, and  other considerations. . Currently, no consensus exists regarding use of hemoglobin A1c for diagnosis of diabetes for children. .    Mean Plasma Glucose 08/02/2018 140  (calc) Final   eAG (mmol/L) 08/02/2018 7.7  (calc) Final   Cholesterol 08/02/2018 252* <200 mg/dL Final   HDL 84/69/629506/24/2020 74  > OR = 40 mg/dL Final   Triglycerides 28/41/324406/24/2020 100  <150 mg/dL Final   LDL Cholesterol (Calc) 08/02/2018 157*  mg/dL (calc) Final   Comment: Reference range: <100 . Desirable range <100 mg/dL for primary prevention;   <70 mg/dL for patients with CHD or diabetic patients  with > or = 2 CHD risk factors. Marland Kitchen. LDL-C is now calculated using the Martin-Hopkins  calculation, which is a validated novel  method providing  better accuracy than the Friedewald equation in the  estimation of LDL-C.  Horald PollenMartin SS et al. Lenox AhrJAMA. 7829;562(132013;310(19): 2061-2068  (http://education.[facility].com/faq/FAQ164)    Total CHOL/HDL Ratio 08/02/2018 3.4  <0.8<5.0 (calc) Final   Non-HDL Cholesterol (Calc) 08/02/2018 178* <130 mg/dL (calc) Final   Comment: For patients with diabetes plus 1 major ASCVD risk  factor, treating to a non-HDL-C goal of <100 mg/dL  (LDL-C of <65<70 mg/dL) is considered a therapeutic  option.    Uric Acid, Serum 08/02/2018 3.6* 4.0 - 8.0 mg/dL Final   Comment: Therapeutic target for gout patients: <6.0 mg/dL .    Uric acid had dropped substantially down to 3.6.  However renal function had deteriorated slightly and creatinine was up to 1.8.  For that reason I decreased his allopurinol from 300 mg a day to 100 mg a day.  I also feel that is no longer wise for him to take metformin so we asked the patient to discontinue the metformin.  His hemoglobin A1c was well controlled at 6.5 however now that he is off the metformin this will likely rise to 7.5 and therefore he is here today to discuss treatment options for his diabetes.  He also complains of pain now in his right foot.  He has pain in the dorsal midfoot as well as pain along the Achilles tendon and the insertion of the Achilles tendon on his posterior calcaneus.  He states that this began after his most recent gout flare.  Is mildly sore but he is unable to take NSAIDs due to his chronic kidney disease.  He has had the pain approximately 1 week.  At that time, my plan was: 1. Controlled type 2 diabetes mellitus with other circulatory complication, without  long-term current use of insulin (HCC) Discontinue metformin due to worsening renal function and replace with Januvia 100 mg a day.  Recheck lab work in 3 to 6 months  2. Acute idiopathic gout of multiple sites Gout flare has resolved.  Continue allopurinol 100 mg a day and recheck uric acid in 6 months  3. CKD stage 3 due to type 2 diabetes mellitus (HCC) Due to declining renal function avoid NSAIDs.  Diabetes is well controlled.  Switch to Januvia 100 mg a day  I believe the pain in his right heel is likely Achilles tendinitis.  We will treat this with prednisone taper pack.  Reassess if no better in 1 week.  09/08/18 Patient presents today with severe pain in his right knee as well as his left foot.  This is been going on now for approximately 5 days.  The right knee is red hot and swollen.  There is a palpable effusion.  The knee is mildly erythematous and warm to the touch.  There is also visible swelling in his left ankle and left midfoot.  This is also slightly erythematous and warm to the touch.  It is visibly swollen compared to the right foot.  He also complains of pain in his lower back that is mild and chronic.  He is taking allopurinol albeit at a lower dose 100 mg a day.  Past Medical History:  Diagnosis Date   CKD (chronic kidney disease) stage 2, GFR 60-89 ml/min 03/17/2012   Coronary artery disease, non-occlusive 03/18/2012   60-70% stenosis in RCA -- FFR 0.84; EF 60-65%   Diabetes mellitus without complication (HCC)    GERD (gastroesophageal reflux disease)    On PPI   History of GI bleed January  2015   No obvious findings on EGD/colonoscopy   Hyperlipidemia    Hypertension    OSA (obstructive sleep apnea) 03/18/2012   Doing better with CPAP   Pulmonary hypertension (HCC) 12/10/2010   ECHO:  Mild PH,mild LVH; PA pressures estimated 30-40 mmHg   RBBB, 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   Event monitor; usually during sleeping hours; therefore not on  beta blocker   Past Surgical History:  Procedure Laterality Date   APPENDECTOMY  1974   CARPAL TUNNEL RELEASE     EYE SURGERY  1960   Left eye   HEMORRHOID SURGERY     LEFT HEART CATH AND CORONARY ANGIOGRAPHY  03/27/2010   Moderate mid RCA lesion,right radial approach,normal EF   LEFT HEART CATHETERIZATION WITH CORONARY ANGIOGRAM N/A 03/17/2012   Procedure: LEFT HEART CATHETERIZATION WITH CORONARY ANGIOGRAM;  Surgeon: Marykay Lex, MD;  Location: [facility] CATH LAB;  Service: Cardiovascular: Fractional Flow Reserve Measurement of mid RCA 60-70% stenosis = 0.84. Otherwise mild LCA CAD.  Normal EF & EDP   SHOULDER ARTHROSCOPY  2006   TRANSTHORACIC ECHOCARDIOGRAM  12/2010    Mild concentric LVH.  EF> 55%.  GR 1 DD.  Mild aortic sclerosis no stenosis.  PA pressures estimated 30-40 mmHg   Current Outpatient Medications on File Prior to Visit  Medication Sig Dispense Refill   allopurinol (ZYLOPRIM) 100 MG tablet Take 1 tablet (100 mg total) by mouth daily. 30 tablet 6   amLODipine (NORVASC) 10 MG tablet Take 1 tablet (10 mg total) by mouth daily. 90 tablet 3   Blood Glucose Monitoring Suppl (FREESTYLE LITE) DEVI Use to check blood sugar once daily 1 each 0   Cholecalciferol (VITAMIN D-3) 1000 UNITS CAPS Take by mouth daily. 2 capsules.     COLCRYS 0.6 MG tablet Take 1 tablet (0.6 mg total) by mouth as needed (for Gout). 30 tablet 3   ferrous sulfate 325 (65 FE) MG tablet Take 1 tablet (325 mg total) by mouth 2 (two) times daily with a meal.  3   fish oil-omega-3 fatty acids 1000 MG capsule Take 1 g by mouth daily.     glucose blood (FREESTYLE LITE) test strip USE TO CHECK BLOOD SUGAR ONCE DAILY. 100 each 12   HYDROcodone-acetaminophen (NORCO) 5-325 MG tablet Take 1 tablet by mouth every 6 (six) hours as needed for moderate pain. 30 tablet 0   Multiple Vitamin (MULTIVITAMIN WITH MINERALS) TABS Take 1 tablet by mouth daily.     Potassium 99 MG TABS Take 99 mg by mouth daily.       predniSONE (DELTASONE) 20 MG tablet 3 tabs poqday 1-2, 2 tabs poqday 3-4, 1 tab poqday 5-6 12 tablet 0   PRESCRIPTION MEDICATION Uses C-PAP at bedtime     rosuvastatin (CRESTOR) 40 MG tablet TAKE 1 TABLET BY MOUTH EVERY DAY (Patient taking differently: Take 40 mg by mouth daily. ) 90 tablet 2   sitaGLIPtin (JANUVIA) 100 MG tablet Take 1 tablet (100 mg total) by mouth daily. 30 tablet 5   valsartan (DIOVAN) 320 MG tablet Take 1 tablet (320 mg total) by mouth daily. 90 tablet 3   No current facility-administered medications on file prior to visit.    No Known Allergies Social History   Socioeconomic History   Marital status: Married    Spouse name: Not on file   Number of children: Not on file   Years of education: Not on file   Highest education level: Not on file  Occupational History   Not on file  Social Needs   Financial resource strain: Not on file   Food insecurity    Worry: Not on file    Inability: Not on file   Transportation needs    Medical: Not on file    Non-medical: Not on file  Tobacco Use   Smoking status: Former Smoker    Packs/day: 1.00    Types: Cigarettes    Quit date: 04/12/2012    Years since quitting: 6.4   Smokeless tobacco: Never Used  Substance and Sexual Activity   Alcohol use: Yes    Alcohol/week: 1.0 standard drinks    Types: 1 Standard drinks or equivalent per week   Drug use: Yes    Types: Marijuana    Comment: cannibus   Sexual activity: Not on file  Lifestyle   Physical activity    Days per week: Not on file    Minutes per session: Not on file   Stress: Not on file  Relationships   Social connections    Talks on phone: Not on file    Gets together: Not on file    Attends religious service: Not on file    Active member of club or organization: Not on file    Attends meetings of clubs or organizations: Not on file    Relationship status: Not on file   Intimate partner violence    Fear of current or ex partner:  Not on file    Emotionally abused: Not on file    Physically abused: Not on file    Forced sexual activity: Not on file  Other Topics Concern   Not on file  Social History Narrative   Married, father of 2, grandfather of 3.   He is a former smoker of about a pack to pack and half cigarettes a day -- he quit in March of this year.    He is an avid exerciser working at least 4-5 days a week doing her walking or stationary bike.      Review of Systems  All other systems reviewed and are negative.      Objective:   Physical Exam  Constitutional: He appears well-developed and well-nourished.  Neck: Neck supple. No JVD present. No thyromegaly present.  Cardiovascular: Normal rate, regular rhythm, normal heart sounds and intact distal pulses.  No murmur heard. Pulmonary/Chest: Effort normal and breath sounds normal. No respiratory distress. He has no wheezes. He has no rales.  Abdominal: Soft. Bowel sounds are normal. He exhibits no distension and no mass. There is no abdominal tenderness. There is no rebound and no guarding.  Musculoskeletal:        General: No edema.     Right knee: He exhibits decreased range of motion, swelling, effusion and erythema. Tenderness found. Medial joint line and lateral joint line tenderness noted.     Left ankle: He exhibits decreased range of motion and swelling.  Lymphadenopathy:    He has no cervical adenopathy.  Vitals reviewed.         Assessment & Plan:  The encounter diagnosis was Polyarthralgia. Clinically I believe the patient is having a gout exacerbation in his right knee and in his left ankle and midfoot.  I recommended prednisone taper pack to calm down the gout exacerbation.  However, I would also like to rule out other potential causes of active synovitis by checking a sed rate, rheumatoid factor, and an ANA.  I will also check a uric  acid level.  Ideally I like his uric acid level to be below 6.  He is on a low-dose of allopurinol  100 mg because of his chronic kidney disease therefore I feel that we need to start him on colchicine 0.6 mg every day in an effort to help prevent these gout attacks in the future as his renal function precludes a higher dose of allopurinol.  Because of taking colchicine every day this can increase the myopathic effects of Crestor.  Therefore I will reduce his Crestor from 40 mg a day to 10 mg a day.  Patient is comfortable with this plan.  He will call me back next week and let me know how his knee and how his ankle are feeling.  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

## 2018-09-11 LAB — SEDIMENTATION RATE: Sed Rate: 46 mm/h — ABNORMAL HIGH (ref 0–20)

## 2018-09-11 LAB — RHEUMATOID FACTOR: Rhuematoid fact SerPl-aCnc: <14 IU/mL (ref <14)

## 2018-09-11 LAB — URIC ACID: Uric Acid, Serum: 5.3 mg/dL (ref 4.0–8.0)

## 2018-09-11 LAB — ANA: Anti Nuclear Antibody (ANA): NEGATIVE

## 2018-09-28 ENCOUNTER — Other Ambulatory Visit: Payer: Self-pay

## 2018-10-02 ENCOUNTER — Encounter: Payer: Self-pay | Admitting: Internal Medicine

## 2018-10-02 ENCOUNTER — Ambulatory Visit (INDEPENDENT_AMBULATORY_CARE_PROVIDER_SITE_OTHER): Payer: Medicare Other | Admitting: Internal Medicine

## 2018-10-02 ENCOUNTER — Other Ambulatory Visit: Payer: Self-pay

## 2018-10-02 VITALS — BP 168/80 | HR 97 | Ht 69.0 in | Wt 205.0 lb

## 2018-10-02 DIAGNOSIS — E1159 Type 2 diabetes mellitus with other circulatory complications: Secondary | ICD-10-CM

## 2018-10-02 DIAGNOSIS — E785 Hyperlipidemia, unspecified: Secondary | ICD-10-CM

## 2018-10-02 DIAGNOSIS — I2510 Atherosclerotic heart disease of native coronary artery without angina pectoris: Secondary | ICD-10-CM

## 2018-10-02 DIAGNOSIS — E663 Overweight: Secondary | ICD-10-CM | POA: Diagnosis not present

## 2018-10-02 NOTE — Progress Notes (Signed)
Patient ID: James CanalesJames Mcdowell, male   DOB: May 05, 1945, 73 y.o.   MRN: [id number]  HPI: James Jones is a 73 y.o.-year-old male, returning for f/u for DM2, dx in ~2005, non-insulin-dependent, lately more controlled, with complications (CAD, CKD). Last visit 4 months ago (virtual). PCP: Dr. Lynnea FerrierWarren Pickard  He was on Prednisone and iv steroids for gout >> sugars higher.  Reviewed HbA1c levels: Lab Results  Component Value Date   HGBA1C 6.5 (H) 08/02/2018   HGBA1C 6.4 (H) 04/11/2018   HGBA1C 6.2 (A) 11/24/2017  04/11/2014: HbA1c 8.4% 01/08/2014: HbA1c 7.7% He had steroid inj for gout in his elbows - 12/2013. He also had a steroid inj in knee fall 2016, too.   He was on: -Metformin ER 500 mg daily with breakfast-restarted 07/2016, but stopped 07/2018.  PCP stopped Metformin 2/2 CKD >> started: - Januvia 100 mg daily in am   He was previously on Januvia. We stopped Glipizide ER 2.5 mg daily in 07/2015. Had nausea, vomiting, loss of appetite with regular metformin.  He was on Actos. He was on insulin before (Lantus) - came off years ago.  Pt checks his sugars 1-3 times a day: - am: 101-141, 168 >> 87-116, 154 >> 101-113 >> 86-130 - 2h after b'fast: 68, 71-156, 161 >> 107 >> n/c >> 119, 160 - before lunch: 96-110 >> 75, 99 >> 113-145 >> 87 - 2h after lunch: 61 -120 >> 107, 118 >> n/c >> 155, 285 (Prednisone for gout)) - before dinner:  74-114 >> 110-148 (snack) >> 87 - 2h after dinner:110-171 >> 102 >> n/c >> 103 - bedtime: 123, 146 >> 89-135 >> n/c - nighttime: 89 >> 112 >> n/c >> 100 >> n/c  Lowest 54 x1 >>...101 >> 86; he has hypoglycemia awareness in the 70s. Highest sugar was 154 >> 148 >> 285 (Prednisone).  Glucometer: Freestyle [facility] did not cover Freestyle Libre CGM.  Pt's meals are: - Breakfast: bacon + eggs, sausage, grits, oatmeal, cereal, toast wheat - Lunch: BLT, soups, fruit, nuts - Dinner: chicken, pork + greens, rice  - Snacks: 1 a day: pretzels;  carrots  Previously exercising by walking several times a week, now not exercising due to the coronavirus pandemic.  -+ CKD-sees nephrology (at the renal ultrasound which showed only renal cysts, otherwise normal), latest BUN/creatinine:  Lab Results  Component Value Date   BUN 23 08/02/2018   CREATININE 1.82 (H) 08/02/2018  05/24/2014: 34/1.64 01/08/2014: 18/1.15 On valsartan. -+ HL; last set of lipids: Lab Results  Component Value Date   CHOL 252 (H) 08/02/2018   HDL 74 08/02/2018   LDLCALC 157 (H) 08/02/2018   TRIG 100 08/02/2018   CHOLHDL 3.4 08/02/2018  04/11/2014: 206/143/70/106 01/08/2014: 161/98/60/73 On rosuvastatin 40. - last eye exam was in spring 2020: No DR reportedly.  He has history of cataract surgery in 2016 and 17. - no numbness and tingling in his feet.  Latest TSH was normal: Lab Results  Component Value Date   TSH 1.82 04/08/2016   He has a history of hand itching, relieved by Zyrtec.  ROS: Constitutional: no weight gain/no weight loss, no fatigue, no subjective hyperthermia, no subjective hypothermia Eyes: no blurry vision, no xerophthalmia ENT: no sore throat, no nodules palpated in neck, no dysphagia, no odynophagia, no hoarseness Cardiovascular: no CP/no SOB/no palpitations/no leg swelling Respiratory: no cough/no SOB/no wheezing Gastrointestinal: no N/no V/no D/no C/no acid reflux Musculoskeletal: no muscle aches/+ joint aches , Skin: no rashes, no hair loss Neurological: no tremors/no  numbness/no tingling/no dizziness  I reviewed pt's medications, allergies, PMH, social hx, family hx, and changes were documented in the history of present illness. Otherwise, unchanged from my initial visit note.  Past Medical History:  Diagnosis Date  . 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 Coronary artery disease, non-occlusive 03/18/2012   60-70% stenosis in RCA -- FFR 0.84; EF 60-65%  . Diabetes mellitus without complication  (HCC)   . GERD (gastroesophageal reflux disease)    On PPI  . History of GI bleed January 2015   No obvious findings on EGD/colonoscopy  . Hyperlipidemia   . Hypertension   . OSA (obstructive sleep apnea) 03/18/2012   Doing better with CPAP  . Pulmonary hypertension (HCC) 12/10/2010   ECHO:  Mild PH,mild LVH; PA pressures estimated 30-40 mmHg  . 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   Event monitor; usually during sleeping hours; therefore not on beta blocker  + Gout  Past Surgical History:  Procedure Laterality Date  . APPENDECTOMY  1974  . CARPAL TUNNEL RELEASE    . EYE SURGERY  1960   Left eye  . HEMORRHOID SURGERY    . LEFT HEART CATH AND CORONARY ANGIOGRAPHY  03/27/2010   Moderate mid RCA lesion,right radial approach,normal EF  . LEFT HEART CATHETERIZATION WITH CORONARY ANGIOGRAM N/A 03/17/2012   Procedure: LEFT HEART CATHETERIZATION WITH CORONARY ANGIOGRAM;  Surgeon: Marykay Lexavid W Harding, MD;  [facility];  Service: Cardiovascular: Fractional Flow Reserve Measurement of mid RCA 60-70% stenosis = 0.84. Otherwise mild LCA CAD.  Normal EF & EDP  . SHOULDER ARTHROSCOPY  2006  . TRANSTHORACIC ECHOCARDIOGRAM  12/2010    Mild concentric LVH.  EF> 55%.  GR 1 DD.  Mild aortic sclerosis no stenosis.  PA pressures estimated 30-40 mmHg   History   Social History  . Marital Status: Married    Spouse Name: N/A   Occupational History  . retired   Social History Main Topics  . Smoking status: Former Smoker -- 2.00 packs/day    Quit date: 04/12/2012  . Smokeless tobacco: Never Used  . Alcohol Use: 0.5 oz/week    1 drink(s) per week  . Drug Use: Yes    Special: Marijuana     Comment: cannibus   Social History Narrative   Married, father of 2, grandfather of 3.   He is a former smoker of about a pack to pack and half cigarettes a day -- he quit in March of this year.    He is an avid exerciser working at least 4-5 days a week doing her walking or stationary  bike.   Current Outpatient Medications on File Prior to Visit  Medication Sig Dispense Refill  . allopurinol (ZYLOPRIM) 100 MG tablet Take 1 tablet (100 mg total) by mouth daily. 30 tablet 6  . amLODipine (NORVASC) 10 MG tablet Take 1 tablet (10 mg total) by mouth daily. 90 tablet 3  . Blood Glucose Monitoring Suppl (FREESTYLE LITE) DEVI Use to check blood sugar once daily 1 each 0  . Cholecalciferol (VITAMIN D-3) 1000 UNITS CAPS Take by mouth daily. 2 capsules.    . colchicine 0.6 MG tablet Take 1 tablet (0.6 mg total) by mouth daily. 30 tablet 2  . COLCRYS 0.6 MG tablet Take 1 tablet (0.6 mg total) by mouth as needed (for Gout). 30 tablet 3  . ferrous sulfate 325 (65 FE) MG tablet Take 1 tablet (325 mg total) by mouth 2 (  two) times daily with a meal.  3  . fish oil-omega-3 fatty acids 1000 MG capsule Take 1 g by mouth daily.    Marland Kitchen. glucose blood (FREESTYLE LITE) test strip USE TO CHECK BLOOD SUGAR ONCE DAILY. 100 each 12  . Multiple Vitamin (MULTIVITAMIN WITH MINERALS) TABS Take 1 tablet by mouth daily.    . Potassium 99 MG TABS Take 99 mg by mouth daily.     . predniSONE (DELTASONE) 20 MG tablet 3 tabs poqday 1-2, 2 tabs poqday 3-4, 1 tab poqday 5-6 12 tablet 0  . PRESCRIPTION MEDICATION Uses C-PAP at bedtime    . rosuvastatin (CRESTOR) 10 MG tablet Take 1 tablet (10 mg total) by mouth daily. 90 tablet 3  . sitaGLIPtin (JANUVIA) 100 MG tablet Take 1 tablet (100 mg total) by mouth daily. 30 tablet 5  . valsartan (DIOVAN) 320 MG tablet Take 1 tablet (320 mg total) by mouth daily. 90 tablet 3   No current facility-administered medications on file prior to visit.    No Known Allergies Family History  Problem Relation Age of Onset  . Kidney failure Mother   . Coronary artery disease Father   . Kidney failure Sister    PE: BP (!) 168/80   Pulse 97   Ht 5\' 9"  (1.753 m)   Wt 205 lb (93 kg)   SpO2 97%   BMI 30.27 kg/m  Body mass index is 30.27 kg/m. Wt Readings from Last 3 Encounters:   10/02/18 205 lb (93 kg)  09/08/18 204 lb (92.5 kg)  08/17/18 205 lb (93 kg)   Constitutional: overweight, in NAD Eyes: PERRLA, EOMI, no exophthalmos ENT: moist mucous membranes, no thyromegaly, no cervical lymphadenopathy Cardiovascular: tachycardia, RR, +1/6 SEM Respiratory: CTA B Gastrointestinal: abdomen soft, NT, ND, BS+ Musculoskeletal: no deformities, strength intact in all 4 Skin: moist, warm, no rashes Neurological: no tremor with outstretched hands, DTR normal in all 4  ASSESSMENT: 1. DM2, non-insulin-dependent, uncontrolled, with complications - CAD - cardiologist Dr. Herbie BaltimoreHarding - CKD - was seeing Dr Kristian CoveyBefekadu >> will change  2. HL  3. Overweight  PLAN:  1. Patient with longstanding, previously uncontrolled type 2 diabetes, with much improved control in the last 2 years.  He was previously on glipizide ER, which he was able to stop in 2017.  He was also on Januvia in the past when he had to come off metformin due to decreased GFR.  He remains off Januvia now but takes a low dose of metformin ER.  At last visit, HbA1c was excellent, at 6.4% and he had another HbA1c since last visit, 2 months ago, at 6.5%. -At last visit, sugars were slightly higher before meals but he was snacking and also drinking grapefruit juice and we discussed about stopping this or at least dilute the juice.  We continued metformin since his GFR was ~42. -since last visit, he had an appointment with PCP and his kidney function was lower.  His metformin was stopped and he was started on Januvia.  Sugars remained controlled afterwards. sugars are still controlled with only few exceptions.  He had a very high blood sugar of 258 when he was on prednisone and also after a steroid injection.  -  I suggested to:  Patient Instructions  Continue Januvia 100 mg before breakfast.  Please return in 4 months with your sugar log.   - advised to check sugars at different times of the day - 1x a day, rotating check  times - advised  for yearly eye exams >> he is UTD - return to clinic in 4 months     2. HL - Reviewed latest lipid panel from 2 months ago: LDL above target, very good HDL, normal triglycerides Lab Results  Component Value Date   CHOL 252 (H) 08/02/2018   HDL 74 08/02/2018   LDLCALC 157 (H) 08/02/2018   TRIG 100 08/02/2018   CHOLHDL 3.4 08/02/2018  - Continues high-dose rosuvastatin without side effects.  3. Obesity - gained 10 lbs since last visit as he is not exercising since the gyms are closed due to the coronavirus pandemic -We will continue Januvia which is weight neutral.  Philemon Kingdom, MD PhD [facility] Endocrinology

## 2018-10-02 NOTE — Patient Instructions (Addendum)
Continue Januvia 100 mg before breakfast.  Please return in 4 months with your sugar log.

## 2018-10-04 ENCOUNTER — Other Ambulatory Visit: Payer: Self-pay | Admitting: Internal Medicine

## 2018-10-24 DIAGNOSIS — H04123 Dry eye syndrome of bilateral lacrimal glands: Secondary | ICD-10-CM | POA: Diagnosis not present

## 2018-10-24 DIAGNOSIS — E119 Type 2 diabetes mellitus without complications: Secondary | ICD-10-CM | POA: Diagnosis not present

## 2018-10-24 DIAGNOSIS — Z961 Presence of intraocular lens: Secondary | ICD-10-CM | POA: Diagnosis not present

## 2018-10-24 LAB — HM DIABETES EYE EXAM

## 2018-11-02 ENCOUNTER — Telehealth: Payer: Self-pay | Admitting: Cardiology

## 2018-11-02 NOTE — Telephone Encounter (Signed)
New Message   1) What problem are you experiencing? Patient is calling in to get assistance with getting a script for another CPAP machine. Please give patient a call back to assist.  2) Who is your medical equipment company? [employer]   Please route to the sleep study assistant.

## 2018-12-04 ENCOUNTER — Other Ambulatory Visit: Payer: Self-pay | Admitting: Family Medicine

## 2018-12-04 DIAGNOSIS — M2550 Pain in unspecified joint: Secondary | ICD-10-CM

## 2018-12-08 ENCOUNTER — Ambulatory Visit (INDEPENDENT_AMBULATORY_CARE_PROVIDER_SITE_OTHER): Payer: Medicare Other | Admitting: Family Medicine

## 2018-12-08 ENCOUNTER — Other Ambulatory Visit: Payer: Self-pay

## 2018-12-08 ENCOUNTER — Other Ambulatory Visit: Payer: Self-pay | Admitting: Family Medicine

## 2018-12-08 VITALS — BP 170/80 | HR 72 | Temp 97.6°F | Resp 14 | Ht 69.0 in | Wt 206.0 lb

## 2018-12-08 DIAGNOSIS — M791 Myalgia, unspecified site: Secondary | ICD-10-CM | POA: Diagnosis not present

## 2018-12-08 DIAGNOSIS — M2550 Pain in unspecified joint: Secondary | ICD-10-CM

## 2018-12-08 DIAGNOSIS — E119 Type 2 diabetes mellitus without complications: Secondary | ICD-10-CM | POA: Diagnosis not present

## 2018-12-08 DIAGNOSIS — N432 Other hydrocele: Secondary | ICD-10-CM

## 2018-12-08 DIAGNOSIS — E1122 Type 2 diabetes mellitus with diabetic chronic kidney disease: Secondary | ICD-10-CM

## 2018-12-08 DIAGNOSIS — I2510 Atherosclerotic heart disease of native coronary artery without angina pectoris: Secondary | ICD-10-CM

## 2018-12-08 DIAGNOSIS — N183 Chronic kidney disease, stage 3 unspecified: Secondary | ICD-10-CM | POA: Diagnosis not present

## 2018-12-08 DIAGNOSIS — I10 Essential (primary) hypertension: Secondary | ICD-10-CM

## 2018-12-08 MED ORDER — HYDROCHLOROTHIAZIDE 25 MG PO TABS: 25.0000 mg | ORAL_TABLET | Freq: Every day | ORAL | 3 refills | Status: DC

## 2018-12-08 NOTE — Progress Notes (Signed)
Subjective:    Patient ID: James Jones, male    DOB: 1945-07-30, 73 y.o.   MRN: [id number]  HPI  Patient is a very pleasant 73 year old African-American gentleman who presents today at the request of his pharmacist.  Pharmacist was concerned about the combination of amlodipine and Crestor for possible rhabdomyolysis.  Patient does admit that he is been having more muscle pains in his shoulders and hips and thighs recently.  The muscle aches have become debilitating.  I do not see any interaction with Crestor and amlodipine.  Certainly simvastatin and amlodipine are contraindicated but Crestor should be fine.  However I am concerned that the Crestor itself potentially could be causing muscle aches.  Of note however the patient's blood pressure today is extremely high.  I rechecked it and found his systolic blood pressure to be even higher than that.  He denies any chest pain shortness of breath or dyspnea on exertion.  He is overdue to recheck his blood work regarding his diabetes.  4 months ago his hemoglobin A1c was adequately controlled at 6.5 however it is due to recheck today.  Of note he also reports swelling in his scrotum.  On examination today, the patient has a large scrotum with an apparent hydrocele.  However there is a soft tissue-like consistency to the hydrocele.  It is not reducible.  It is not worsening with Valsalva.  It does not improve when the patient is supine.  It is so swollen is difficult to isolate the testicle itself.  I do not feel that it is a hernia however the texture is not consistent simply with hydrocele either. Past Medical History:  Diagnosis Date  . 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 Coronary artery disease, non-occlusive 03/18/2012   60-70% stenosis in RCA -- FFR 0.84; EF 60-65%  . Diabetes mellitus without complication ([place])   . GERD (gastroesophageal reflux disease)    On PPI  . History of GI bleed January 2015   No obvious  findings on EGD/colonoscopy  . Hyperlipidemia   . Hypertension   . OSA (obstructive sleep apnea) 03/18/2012   Doing better with CPAP  . Pulmonary hypertension ([place]) 12/10/2010   ECHO:  Mild PH,mild LVH; PA pressures estimated 30-40 mmHg  . 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 APPENDECTOMY  1974  . CARPAL TUNNEL RELEASE    . EYE SURGERY  1960   Left eye  . HEMORRHOID SURGERY    . LEFT HEART CATH AND CORONARY ANGIOGRAPHY  03/27/2010   Moderate mid RCA lesion,right radial approach,normal EF  . LEFT HEART CATHETERIZATION WITH CORONARY ANGIOGRAM N/A 03/17/2012   Procedure: LEFT HEART CATHETERIZATION WITH CORONARY ANGIOGRAM;  Surgeon: Leonie Man, MD;  Location: [facility] C[facility] LAB;  Service: Cardiovascular: Fractional Flow Reserve Measurement of mid RCA 60-70% stenosis = 0.84. Otherwise mild LCA CAD.  Normal EF & EDP  . SHOULDER ARTHROSCOPY  2006  . TRANSTHORACIC ECHOCARDIOGRAM  12/2010    Mild concentric LVH.  EF> 55%.  GR 1 DD.  Mild aortic sclerosis no stenosis.  PA pressures estimated 30-40 mmHg   Current Outpatient Medications on File Prior to Visit  Medication Sig Dispense Refill  . allopurinol (ZYLOPRIM) 100 MG tablet Take 1 tablet (100 mg total) by mouth daily. 30 tablet 6  . amLODipine (NORVASC) 10 MG tablet  Take 1 tablet (10 mg total) by mouth daily. 90 tablet 3  . Blood Glucose Monitoring Suppl (FREESTYLE LITE) DEVI Use to check blood sugar once daily 1 each 0  . Cholecalciferol (VITAMIN D-3) 1000 UNITS CAPS Take by mouth daily. 2 capsules.    . colchicine 0.6 MG tablet Take 1 tablet (0.6 mg total) by mouth daily. 30 tablet 2  . ferrous sulfate 325 (65 FE) MG tablet Take 1 tablet (325 mg total) by mouth 2 (two) times daily with a meal.  3  . fish oil-omega-3 fatty acids 1000 MG capsule Take 1 g by mouth daily.    Marland Kitchen glucose blood  (FREESTYLE LITE) test strip USE TO CHECK BLOOD SUGAR ONCE DAILY. 100 each 12  . Multiple Vitamin (MULTIVITAMIN WITH MINERALS) TABS Take 1 tablet by mouth daily.    . Potassium 99 MG TABS Take 99 mg by mouth daily.     . predniSONE (DELTASONE) 20 MG tablet 3 tabs poqday 1-2, 2 tabs poqday 3-4, 1 tab poqday 5-6 12 tablet 0  . PRESCRIPTION MEDICATION Uses C-PAP at bedtime    . rosuvastatin (CRESTOR) 10 MG tablet Take 1 tablet (10 mg total) by mouth daily. 90 tablet 3  . sitaGLIPtin (JANUVIA) 100 MG tablet Take 1 tablet (100 mg total) by mouth daily. 30 tablet 5  . valsartan (DIOVAN) 320 MG tablet Take 1 tablet (320 mg total) by mouth daily. 90 tablet 3   No current facility-administered medications on file prior to visit.    No Known Allergies Social History   Socioeconomic History  . Marital status: Married    Spouse name: Not on file  . Number of children: Not on file  . Years of education: Not on file  . Highest education level: Not on file  Occupational History  . Not on file  Social Needs  . Financial resource strain: Not on file  . Food insecurity    Worry: Not on file    Inability: Not on file  . Transportation needs    Medical: Not on file    Non-medical: Not on file  Tobacco Use  . Smoking status: Former Smoker    Packs/day: 1.00    Types: Cigarettes    Quit date: 04/12/2012    Years since quitting: 6.6  . Smokeless tobacco: Never Used  Substance and Sexual Activity  . Alcohol use: Yes    Alcohol/week: 1.0 standard drinks    Types: 1 Standard drinks or equivalent per week  . Drug use: Yes    Types: Marijuana    Comment: cannibus  . Sexual activity: Not on file  Lifestyle  . Physical activity    Days per week: Not on file    Minutes per session: Not on file  . Stress: Not on file  Relationships  . Social Musician on phone: Not on file    Gets together: Not on file    Attends religious service: Not on file    Active member of club or organization:  Not on file    Attends meetings of clubs or organizations: Not on file    Relationship status: Not on file  . Intimate partner violence    Fear of current or ex partner: Not on file    Emotionally abused: Not on file    Physically abused: Not on file    Forced sexual activity: Not on file  Other Topics Concern  . Not on file  Social History Narrative  Married, father of 2, grandfather of 3.   He is a former smoker of about a pack to pack and half cigarettes a day -- he quit in March of this year.    He is an avid exerciser working at least 4-5 days a week doing her walking or stationary bike.      Review of Systems  All other systems reviewed and are negative.      Objective:   Physical Exam  Constitutional: He appears well-developed and well-nourished.  Neck: Neck supple. No JVD present. No thyromegaly present.  Cardiovascular: Normal rate, regular rhythm, normal heart sounds and intact distal pulses.  No murmur heard. Pulmonary/Chest: Effort normal and breath sounds normal. No respiratory distress. He has no wheezes. He has no rales.  Abdominal: Soft. Bowel sounds are normal. He exhibits no distension and no mass. There is no abdominal tenderness. There is no rebound and no guarding.  Genitourinary: Right testis shows swelling. Left testis shows swelling.  Musculoskeletal:        General: No edema.  Lymphadenopathy:    He has no cervical adenopathy.  Vitals reviewed.         Assessment & Plan:  Myalgia - Plan: CK  Controlled type 2 diabetes mellitus without complication, without long-term current use of insulin (HCC) - Plan: Hemoglobin A1c, CBC with Differential/Platelet, COMPLETE METABOLIC PANEL WITH GFR, Lipid panel, Microalbumin, urine  Other hydrocele - Plan: US Scrotum  CKD stage 3 due to type 2 diabetes mellitus (HCC)  ASCVD (arteriosclerotic cardiovascular disease)  Essential hypertension  Regarding his myalgias, I will temporarily have the patient  discontinue Crestor and check a CK level.  Given his history of coronary artery disease a hesitate to keep the patient off his statin unless there is definite evidence of muscle breakdown in his lab work or less the patient definitively sees improvement in his muscle aches.  Regarding his diabetes, while the patient is here I would like to repeat a hemoglobin A1c and a urine microalbumin.  Ideally like to keep his hemoglobin A1c less than 6.5.  The swelling in his scrotum appears to be a large hydrocele however the texture is inconsistent with this.  It feels almost like a large hernia.  It is roughly the size of a softball.  However I cannot isolate the testicles on exam due to the swelling in the tenseness of his scrotum.  Therefore I recommended an ultrasound to evaluate further.  Given his chronic kidney disease I will recheck a BMP to monitor his creatinine.  His blood pressure today is extremely high.  Despite having gout, I will start the patient on hydrochlorothiazide 25 mg a day and recheck his blood pressure in 2 weeks.

## 2018-12-09 LAB — COMPLETE METABOLIC PANEL WITH GFR
AG Ratio: 2 (calc) (ref 1.0–2.5)
ALT: 20 U/L (ref 9–46)
AST: 22 U/L (ref 10–35)
Albumin: 4 g/dL (ref 3.6–5.1)
Alkaline phosphatase (APISO): 65 U/L (ref 35–144)
BUN/Creatinine Ratio: 9 (calc) (ref 6–22)
BUN: 15 mg/dL (ref 7–25)
CO2: 22 mmol/L (ref 20–32)
Calcium: 9.4 mg/dL (ref 8.6–10.3)
Chloride: 108 mmol/L (ref 98–110)
Creat: 1.66 mg/dL — ABNORMAL HIGH (ref 0.70–1.18)
GFR, Est African American: 46 mL/min/{1.73_m2} — ABNORMAL LOW (ref >=60)
GFR, Est Non African American: 40 mL/min/{1.73_m2} — ABNORMAL LOW (ref >=60)
Globulin: 2 g/dL (calc) (ref 1.9–3.7)
Glucose, Bld: 122 mg/dL — ABNORMAL HIGH (ref 65–99)
Potassium: 3.7 mmol/L (ref 3.5–5.3)
Sodium: 141 mmol/L (ref 135–146)
Total Bilirubin: 0.3 mg/dL (ref 0.2–1.2)
Total Protein: 6 g/dL — ABNORMAL LOW (ref 6.1–8.1)

## 2018-12-09 LAB — CBC WITH DIFFERENTIAL/PLATELET
Absolute Monocytes: 583 cells/uL (ref 200–950)
Basophils Absolute: 52 cells/uL (ref 0–200)
Basophils Relative: 0.6 %
Eosinophils Absolute: 61 cells/uL (ref 15–500)
Eosinophils Relative: 0.7 %
HCT: 36.4 % — ABNORMAL LOW (ref 38.5–50.0)
Hemoglobin: 12.1 g/dL — ABNORMAL LOW (ref 13.2–17.1)
Lymphs Abs: 1523 cells/uL (ref 850–3900)
MCH: 27.6 pg (ref 27.0–33.0)
MCHC: 33.2 g/dL (ref 32.0–36.0)
MCV: 83.1 fL (ref 80.0–100.0)
MPV: 10.2 fL (ref 7.5–12.5)
Monocytes Relative: 6.7 %
Neutro Abs: 6482 cells/uL (ref 1500–7800)
Neutrophils Relative %: 74.5 %
Platelets: 325 10*3/uL (ref 140–400)
RBC: 4.38 10*6/uL (ref 4.20–5.80)
RDW: 14.4 % (ref 11.0–15.0)
Total Lymphocyte: 17.5 %
WBC: 8.7 10*3/uL (ref 3.8–10.8)

## 2018-12-09 LAB — HEMOGLOBIN A1C
Hgb A1c MFr Bld: 6.3 % of total Hgb — ABNORMAL HIGH (ref <5.7)
Mean Plasma Glucose: 134 (calc)
eAG (mmol/L): 7.4 (calc)

## 2018-12-09 LAB — LIPID PANEL
Cholesterol: 199 mg/dL (ref <200)
HDL: 66 mg/dL (ref >=40)
LDL Cholesterol (Calc): 109 mg/dL (calc) — ABNORMAL HIGH
Non-HDL Cholesterol (Calc): 133 mg/dL (calc) — ABNORMAL HIGH (ref <130)
Total CHOL/HDL Ratio: 3 (calc) (ref <5.0)
Triglycerides: 128 mg/dL (ref <150)

## 2018-12-09 LAB — CK: Total CK: 253 U/L — ABNORMAL HIGH (ref 44–196)

## 2018-12-09 LAB — MICROALBUMIN, URINE: Microalb, Ur: 176.3 mg/dL

## 2018-12-12 ENCOUNTER — Other Ambulatory Visit: Payer: Self-pay | Admitting: Family Medicine

## 2018-12-12 DIAGNOSIS — M791 Myalgia, unspecified site: Secondary | ICD-10-CM

## 2018-12-15 ENCOUNTER — Ambulatory Visit (HOSPITAL_COMMUNITY)
Admission: RE | Admit: 2018-12-15 | Discharge: 2018-12-15 | Disposition: A | Discharge status: Home / Self Care | Payer: Medicare Other | Source: Ambulatory Visit | Attending: Family Medicine | Admitting: Family Medicine

## 2018-12-15 ENCOUNTER — Other Ambulatory Visit: Payer: Self-pay

## 2018-12-15 DIAGNOSIS — N432 Other hydrocele: Secondary | ICD-10-CM | POA: Diagnosis not present

## 2018-12-15 DIAGNOSIS — N433 Hydrocele, unspecified: Secondary | ICD-10-CM | POA: Diagnosis not present

## 2018-12-18 ENCOUNTER — Encounter: Payer: Self-pay | Admitting: Family Medicine

## 2018-12-22 ENCOUNTER — Ambulatory Visit (INDEPENDENT_AMBULATORY_CARE_PROVIDER_SITE_OTHER): Payer: Medicare Other | Admitting: Family Medicine

## 2018-12-22 ENCOUNTER — Other Ambulatory Visit: Payer: Self-pay

## 2018-12-22 ENCOUNTER — Encounter: Payer: Self-pay | Admitting: Family Medicine

## 2018-12-22 VITALS — BP 152/70 | HR 80 | Temp 97.8°F | Resp 16 | Ht 69.0 in | Wt 207.0 lb

## 2018-12-22 DIAGNOSIS — M791 Myalgia, unspecified site: Secondary | ICD-10-CM

## 2018-12-22 DIAGNOSIS — I2510 Atherosclerotic heart disease of native coronary artery without angina pectoris: Secondary | ICD-10-CM

## 2018-12-22 MED ORDER — COLCHICINE 0.6 MG PO TABS: 0.6000 mg | ORAL_TABLET | Freq: Every day | ORAL | 2 refills | Status: DC

## 2018-12-22 MED ORDER — PREDNISONE 10 MG PO TABS: 10.0000 mg | ORAL_TABLET | Freq: Every day | ORAL | 0 refills | Status: DC

## 2018-12-22 NOTE — Progress Notes (Signed)
Subjective:    Patient ID: James Jones, male    DOB: 1945-06-16, 73 y.o.   MRN: [id number]  HPI  12/01/18 Patient is a very pleasant 73 year old African-American gentleman who presents today at the request of his pharmacist.  Pharmacist was concerned about the combination of amlodipine and Crestor for possible rhabdomyolysis.  Patient does admit that he is been having more muscle pains in his shoulders and hips and thighs recently.  The muscle aches have become debilitating.  I do not see any interaction with Crestor and amlodipine.  Certainly simvastatin and amlodipine are contraindicated but Crestor should be fine.  However I am concerned that the Crestor itself potentially could be causing muscle aches.  Of note however the patient's blood pressure today is extremely high.  I rechecked it and found his systolic blood pressure to be even higher than that.  He denies any chest pain shortness of breath or dyspnea on exertion.  He is overdue to recheck his blood work regarding his diabetes.  4 months ago his hemoglobin A1c was adequately controlled at 6.5 however it is due to recheck today.  Of note he also reports swelling in his scrotum.  On examination today, the patient has a large scrotum with an apparent hydrocele.  However there is a soft tissue-like consistency to the hydrocele.  It is not reducible.  It is not worsening with Valsalva.  It does not improve when the patient is supine.  It is so swollen is difficult to isolate the testicle itself.  I do not feel that it is a hernia however the texture is not consistent simply with hydrocele either.  At that time, my plan was: Regarding his myalgias, I will temporarily have the patient discontinue Crestor and check a CK level.  Given his history of coronary artery disease a hesitate to keep the patient off his statin unless there is definite evidence of muscle breakdown in his lab work or less the patient definitively sees improvement in his muscle  aches.  Regarding his diabetes, while the patient is here I would like to repeat a hemoglobin A1c and a urine microalbumin.  Ideally like to keep his hemoglobin A1c less than 6.5.  The swelling in his scrotum appears to be a large hydrocele however the texture is inconsistent with this.  It feels almost like a large hernia.  It is roughly the size of a softball.  However I cannot isolate the testicles on exam due to the swelling in the tenseness of his scrotum.  Therefore I recommended an ultrasound to evaluate further.  Given his chronic kidney disease I will recheck a BMP to monitor his creatinine.  His blood pressure today is extremely high.  Despite having gout, I will start the patient on hydrochlorothiazide 25 mg a day and recheck his blood pressure in 2 weeks.  12/22/18 Patient CK level was mildly elevated in the mid 200s.  We had the patient discontinue his Crestor.  He is here today for recheck.  His myalgias have not improved.  He reports significant myalgias in both shoulders.  He reports significant myalgias also in both calf muscles as well as in his hamstrings.  He has not seen any significant improvement in the muscle aches since holding Crestor.  His ultrasound confirmed large hydroceles in his scrotum.  These are asymptomatic and he denies or declines any treatment for this. Past Medical History:  Diagnosis Date   CKD (chronic kidney disease) stage 2, GFR 60-89 ml/min 03/17/2012  Coronary artery disease, non-occlusive 03/18/2012   60-70% stenosis in RCA -- FFR 0.84; EF 60-65%   Diabetes mellitus without complication (HCC)    GERD (gastroesophageal reflux disease)    On PPI   History of GI bleed January 2015   No obvious findings on EGD/colonoscopy   Hydrocele, bilateral    large on [place] 2020   Hyperlipidemia    Hypertension    OSA (obstructive sleep apnea) 03/18/2012   Doing better with CPAP   Pulmonary hypertension ([place]) 12/10/2010   ECHO:  Mild PH,mild LVH; PA  pressures estimated 30-40 mmHg   RBBB, 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   Event monitor; usually during sleeping hours; therefore not on beta blocker   Past Surgical History:  Procedure Laterality Date   [place]   Left eye   HEMORRHOID SURGERY     LEFT HEART CATH AND CORONARY ANGIOGRAPHY  03/27/2010   Moderate mid RCA lesion,right radial approach,normal EF   LEFT HEART CATHETERIZATION WITH CORONARY ANGIOGRAM N/A 03/17/2012   Procedure: LEFT HEART CATHETERIZATION WITH CORONARY ANGIOGRAM;  Surgeon: Leonie Man, MD;  Location: [facility] CATH LAB;  Service: Cardiovascular: Fractional Flow Reserve Measurement of mid RCA 60-70% stenosis = 0.84. Otherwise mild LCA CAD.  Normal EF & EDP   SHOULDER ARTHROSCOPY  2006   TRANSTHORACIC ECHOCARDIOGRAM  12/2010    Mild concentric LVH.  EF> 55%.  GR 1 DD.  Mild aortic sclerosis no stenosis.  PA pressures estimated 30-40 mmHg   Current Outpatient Medications on File Prior to Visit  Medication Sig Dispense Refill   allopurinol (ZYLOPRIM) 100 MG tablet Take 1 tablet (100 mg total) by mouth daily. 30 tablet 6   amLODipine (NORVASC) 10 MG tablet Take 1 tablet (10 mg total) by mouth daily. 90 tablet 3   Blood Glucose Monitoring Suppl (FREESTYLE LITE) DEVI Use to check blood sugar once daily 1 each 0   Cholecalciferol (VITAMIN D-3) 1000 UNITS CAPS Take by mouth daily. 2 capsules.     colchicine 0.6 MG tablet Take 1 tablet (0.6 mg total) by mouth daily. 30 tablet 2   ferrous sulfate 325 (65 FE) MG tablet Take 1 tablet (325 mg total) by mouth 2 (two) times daily with a meal.  3   fish oil-omega-3 fatty acids 1000 MG capsule Take 1 g by mouth daily.     glucose blood (FREESTYLE LITE) test strip USE TO CHECK BLOOD SUGAR ONCE DAILY. 100 each 12   hydrochlorothiazide (HYDRODIURIL) 25 MG tablet Take 1 tablet (25 mg total) by mouth daily. 90 tablet 3   Multiple Vitamin  (MULTIVITAMIN WITH MINERALS) TABS Take 1 tablet by mouth daily.     Potassium 99 MG TABS Take 99 mg by mouth daily.      predniSONE (DELTASONE) 20 MG tablet 3 tabs poqday 1-2, 2 tabs poqday 3-4, 1 tab poqday 5-6 12 tablet 0   PRESCRIPTION MEDICATION Uses C-PAP at bedtime     rosuvastatin (CRESTOR) 10 MG tablet Take 1 tablet (10 mg total) by mouth daily. 90 tablet 3   sitaGLIPtin (JANUVIA) 100 MG tablet Take 1 tablet (100 mg total) by mouth daily. 30 tablet 5   valsartan (DIOVAN) 320 MG tablet Take 1 tablet (320 mg total) by mouth daily. 90 tablet 3   No current facility-administered medications on file prior to visit.    No Known Allergies Social History   Socioeconomic History  Marital status: Married    Spouse name: Not on file   Number of children: Not on file   Years of education: Not on file   Highest education level: Not on file  Occupational History   Not on file  Social Needs   Financial resource strain: Not on file   Food insecurity    Worry: Not on file    Inability: Not on file   Transportation needs    Medical: Not on file    Non-medical: Not on file  Tobacco Use   Smoking status: Former Smoker    Packs/day: 1.00    Types: Cigarettes    Quit date: 04/12/2012    Years since quitting: 6.6   Smokeless tobacco: Never Used  Substance and Sexual Activity   Alcohol use: Yes    Alcohol/week: 1.0 standard drinks    Types: 1 Standard drinks or equivalent per week   Drug use: Yes    Types: Marijuana    Comment: cannibus   Sexual activity: Not on file  Lifestyle   Physical activity    Days per week: Not on file    Minutes per session: Not on file   Stress: Not on file  Relationships   Social connections    Talks on phone: Not on file    Gets together: Not on file    Attends religious service: Not on file    Active member of club or organization: Not on file    Attends meetings of clubs or organizations: Not on file    Relationship status:  Not on file   Intimate partner violence    Fear of current or ex partner: Not on file    Emotionally abused: Not on file    Physically abused: Not on file    Forced sexual activity: Not on file  Other Topics Concern   Not on file  Social History Narrative   Married, father of 2, grandfather of 3.   He is a former smoker of about a pack to pack and half cigarettes a day -- he quit in March of this year.    He is an avid exerciser working at least 4-5 days a week doing her walking or stationary bike.      Review of Systems  All other systems reviewed and are negative.      Objective:   Physical Exam  Constitutional: He appears well-developed and well-nourished.  Neck: Neck supple. No JVD present. No thyromegaly present.  Cardiovascular: Normal rate, regular rhythm, normal heart sounds and intact distal pulses.  No murmur heard. Pulmonary/Chest: Effort normal and breath sounds normal. No respiratory distress. He has no wheezes. He has no rales.  Abdominal: Soft. Bowel sounds are normal. He exhibits no distension and no mass. There is no abdominal tenderness. There is no rebound and no guarding.  Genitourinary: Right testis shows swelling. Left testis shows swelling.  Musculoskeletal:        General: No edema.  Lymphadenopathy:    He has no cervical adenopathy.  Vitals reviewed.         Assessment & Plan:  Myalgia - Plan: Sedimentation Rate, CK  Now suspect autoimmune disease such as polymyalgia rheumatica or less likely myositis.  Repeat CK levels to see if there is any trend since discontinuing Crestor to suggest improvement in muscle breakdown.  Try the patient on prednisone 10 mg a day and recheck via telephone in 1 week to see if muscle aches have improved.  Also  check sedimentation rate for evidence of PMR.

## 2018-12-23 LAB — CK: Total CK: 274 U/L — ABNORMAL HIGH (ref 44–196)

## 2018-12-23 LAB — SEDIMENTATION RATE: Sed Rate: 9 mm/h (ref 0–20)

## 2019-01-05 ENCOUNTER — Other Ambulatory Visit: Payer: Self-pay | Admitting: Family Medicine

## 2019-01-27 ENCOUNTER — Other Ambulatory Visit: Payer: Self-pay | Admitting: Internal Medicine

## 2019-01-29 ENCOUNTER — Ambulatory Visit: Payer: Medicare Other | Admitting: Internal Medicine

## 2019-02-12 ENCOUNTER — Other Ambulatory Visit: Payer: Self-pay

## 2019-02-13 ENCOUNTER — Ambulatory Visit: Payer: Medicare Other | Admitting: Internal Medicine

## 2019-02-13 ENCOUNTER — Encounter: Payer: Self-pay | Admitting: Internal Medicine

## 2019-02-13 ENCOUNTER — Ambulatory Visit (INDEPENDENT_AMBULATORY_CARE_PROVIDER_SITE_OTHER): Payer: Medicare Other | Admitting: Internal Medicine

## 2019-02-13 VITALS — BP 160/80 | HR 82 | Ht 69.0 in | Wt 208.0 lb

## 2019-02-13 DIAGNOSIS — E785 Hyperlipidemia, unspecified: Secondary | ICD-10-CM | POA: Diagnosis not present

## 2019-02-13 DIAGNOSIS — E669 Obesity, unspecified: Secondary | ICD-10-CM

## 2019-02-13 DIAGNOSIS — E1159 Type 2 diabetes mellitus with other circulatory complications: Secondary | ICD-10-CM | POA: Diagnosis not present

## 2019-02-13 DIAGNOSIS — E66811 Obesity, class 1: Secondary | ICD-10-CM

## 2019-02-13 DIAGNOSIS — Z6830 Body mass index (BMI) 30.0-30.9, adult: Secondary | ICD-10-CM | POA: Diagnosis not present

## 2019-02-13 LAB — POCT GLYCOSYLATED HEMOGLOBIN (HGB A1C): Hemoglobin A1C: 6.6 % — AB (ref 4.0–5.6)

## 2019-02-13 NOTE — Progress Notes (Signed)
Patient ID: James Jones, male   DOB: Jun 14, 1945, 74 y.o.   MRN: [id number]  This visit occurred during the SARS-CoV-2 public health emergency.  Safety protocols were in place, including screening questions prior to the visit, additional usage of staff PPE, and extensive cleaning of exam room while observing appropriate contact time as indicated for disinfecting solutions.   HPI: James Jones is a 74 y.o.-year-old male, returning for f/u for DM2, dx in ~2005, non-insulin-dependent, lately more controlled, with complications (CAD, CKD). Last visit 3.5 months ago. PCP: Dr. Lynnea Jones  Reviewed HbA1c levels: Lab Results  Component Value Date   HGBA1C 6.3 (H) 12/08/2018   HGBA1C 6.5 (H) 08/02/2018   HGBA1C 6.4 (H) 04/11/2018  04/11/2014: HbA1c 8.4% 01/08/2014: HbA1c 7.7% He had steroid inj for gout in his elbows - 12/2013. He also had a steroid inj in knee fall 2016, too.   He is on: - Januvia 100 mg daily in a.m. Previously on low-dose Metformin ER, but stopped 07/2018 due to CKD We stopped Glipizide ER 2.5 mg daily in 07/2015. Had nausea, vomiting, loss of appetite with regular metformin.  He was on Actos. He was on insulin before (Lantus) - came off years ago.  Pt checks his sugars 1-3 times a day: - am: 87-116, 154 >> 101-113 >> 86-130 >> 79, 92-114 - 2h after b'fast: 68, 71-156, 161 >> 107 >> n/c >> 119, 160 >> n/c - before lunch: 96-110 >> 75, 99 >> 113-145 >> 87 >> 86, 105 - 2h after lunch: 107, 118 >> n/c >> 155, 285 (Prednisone) >> n/c - before dinner:  74-114 >> 110-148 (snack) >> 87 >> n/c - 2h after dinner:110-171 >> 102 >> n/c >> 103 >> 89, 148 - bedtime: 123, 146 >> 89-135 >> n/c - nighttime: 89 >> 112 >> n/c >> 100 >> n/c  Lowest 54 x1 >>...101 >> 86 >> 79; he has hypoglycemia awareness in the 70s. Highest sugar was 154 >> 148 >> 285 (Prednisone) >> 148.  Glucometer: Freestyle [facility] did not cover Freestyle Libre CGM.  Pt's meals are: - Breakfast: bacon + eggs,  sausage, grits, oatmeal, cereal, toast wheat - Lunch: BLT, soups, fruit, nuts - Dinner: chicken, pork + greens, rice  - Snacks: 1 a day: pretzels; carrots  Previously exercising by walking several times a week, now not exercising due to the coronavirus pandemic.  -+ CKD-sees nephrology (renal ultrasound showed renal cysts), latest BUN/creatinine:  Lab Results  Component Value Date   BUN 15 12/08/2018   CREATININE 1.66 (H) 12/08/2018  05/24/2014: 34/1.64 01/08/2014: 18/1.15 On valsartan. -+ HL; last set of lipids: Lab Results  Component Value Date   CHOL 199 12/08/2018   HDL 66 12/08/2018   LDLCALC 109 (H) 12/08/2018   TRIG 128 12/08/2018   CHOLHDL 3.0 12/08/2018  On rosuvastatin 40. - last eye exam was in 10/2018: No DR.  He has history of cataract surgery in 2016 and 17. - nonumbness and tingling in his feet.  Latest TSH was normal: Lab Results  Component Value Date   TSH 1.82 04/08/2016   ROS: Constitutional: no weight gain/no weight loss, no fatigue, no subjective hyperthermia, no subjective hypothermia Eyes: no blurry vision, no xerophthalmia ENT: no sore throat, no nodules palpated in neck, no dysphagia, no odynophagia, no hoarseness Cardiovascular: no CP/no SOB/no palpitations/no leg swelling Respiratory: no cough/no SOB/no wheezing Gastrointestinal: no N/no V/no D/no C/no acid reflux Musculoskeletal: no muscle aches/+ joint aches Skin: no rashes, no hair loss Neurological: no  tremors/no numbness/no tingling/no dizziness  I reviewed pt's medications, allergies, PMH, social hx, family hx, and changes were documented in the history of present illness. Otherwise, unchanged from my initial visit note.  Past Medical History:  Diagnosis Date  . 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 Coronary artery disease, non-occlusive 03/18/2012   60-70% stenosis in RCA -- FFR 0.84; EF 60-65%  . Diabetes mellitus without complication (HCC)   . GERD  (gastroesophageal reflux disease)    On PPI  . History of GI bleed January 2015   No obvious findings on EGD/colonoscopy  . Hydrocele, bilateral    large on [place] 2020  . Hyperlipidemia   . Hypertension   . OSA (obstructive sleep apnea) 03/18/2012   Doing better with CPAP  . Pulmonary hypertension (HCC) 12/10/2010   ECHO:  Mild PH,mild LVH; PA pressures estimated 30-40 mmHg  . 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   Event monitor; usually during sleeping hours; therefore not on beta blocker  + Gout  Past Surgical History:  Procedure Laterality Date  . APPENDECTOMY  1974  . CARPAL TUNNEL RELEASE    . EYE SURGERY  1960   Left eye  . HEMORRHOID SURGERY    . LEFT HEART CATH AND CORONARY ANGIOGRAPHY  03/27/2010   Moderate mid RCA lesion,right radial approach,normal EF  . LEFT HEART CATHETERIZATION WITH CORONARY ANGIOGRAM N/A 03/17/2012   Procedure: LEFT HEART CATHETERIZATION WITH CORONARY ANGIOGRAM;  Surgeon: Marykay Lex, MD;  Location: [facility] CATH LAB;  Service: Cardiovascular: Fractional Flow Reserve Measurement of mid RCA 60-70% stenosis = 0.84. Otherwise mild LCA CAD.  Normal EF & EDP  . SHOULDER ARTHROSCOPY  2006  . TRANSTHORACIC ECHOCARDIOGRAM  12/2010    Mild concentric LVH.  EF> 55%.  GR 1 DD.  Mild aortic sclerosis no stenosis.  PA pressures estimated 30-40 mmHg   History   Social History  . Marital Status: Married    Spouse Name: N/A   Occupational History  . retired   Social History Main Topics  . Smoking status: Former Smoker -- 2.00 packs/day    Quit date: 04/12/2012  . Smokeless tobacco: Never Used  . Alcohol Use: 0.5 oz/week    1 drink(s) per week  . Drug Use: Yes    Special: Marijuana     Comment: cannibus   Social History Narrative   Married, father of 2, grandfather of 3.   He is a former smoker of about a pack to pack and half cigarettes a day -- he quit in March of this year.    He is an avid exerciser working at least 4-5 days a week doing  her walking or stationary bike.   Current Outpatient Medications on File Prior to Visit  Medication Sig Dispense Refill  . allopurinol (ZYLOPRIM) 100 MG tablet Take 1 tablet (100 mg total) by mouth daily. 30 tablet 6  . amLODipine (NORVASC) 10 MG tablet Take 1 tablet (10 mg total) by mouth daily. 90 tablet 3  . Blood Glucose Monitoring Suppl (FREESTYLE LITE) DEVI Use to check blood sugar once daily 1 each 0  . Cholecalciferol (VITAMIN D-3) 1000 UNITS CAPS Take by mouth daily. 2 capsules.    . colchicine 0.6 MG tablet Take 1 tablet (0.6 mg total) by mouth daily. 30 tablet 2  . colchicine 0.6 MG tablet Take 1 tablet (0.6 mg total) by mouth daily. Generic only, not colcrys 30 tablet 2  . ferrous sulfate 325 (  65 FE) MG tablet Take 1 tablet (325 mg total) by mouth 2 (two) times daily with a meal.  3  . fish oil-omega-3 fatty acids 1000 MG capsule Take 1 g by mouth daily.    Marland Kitchen glucose blood (FREESTYLE LITE) test strip USE TO CHECK BLOOD SUGAR ONCE DAILY. E11.59 100 strip 12  . hydrochlorothiazide (HYDRODIURIL) 25 MG tablet Take 1 tablet (25 mg total) by mouth daily. 90 tablet 3  . JANUVIA 100 MG tablet TAKE 1 TABLET BY MOUTH EVERY DAY 90 tablet 1  . Multiple Vitamin (MULTIVITAMIN WITH MINERALS) TABS Take 1 tablet by mouth daily.    . Potassium 99 MG TABS Take 99 mg by mouth daily.     . predniSONE (DELTASONE) 10 MG tablet Take 1 tablet (10 mg total) by mouth daily with breakfast. 21 tablet 0  . predniSONE (DELTASONE) 20 MG tablet 3 tabs poqday 1-2, 2 tabs poqday 3-4, 1 tab poqday 5-6 12 tablet 0  . PRESCRIPTION MEDICATION Uses C-PAP at bedtime    . rosuvastatin (CRESTOR) 10 MG tablet Take 1 tablet (10 mg total) by mouth daily. 90 tablet 3  . valsartan (DIOVAN) 320 MG tablet Take 1 tablet (320 mg total) by mouth daily. 90 tablet 3   No current facility-administered medications on file prior to visit.   No Known Allergies Family History  Problem Relation Age of Onset  . Kidney failure Mother   .  Coronary artery disease Father   . Kidney failure Sister    PE: BP (!) 160/80   Pulse 82   Ht 5\' 9"  (1.753 m)   Wt 208 lb (94.3 kg)   SpO2 95%   BMI 30.72 kg/m  Body mass index is 30.72 kg/m. Wt Readings from Last 3 Encounters:  02/13/19 208 lb (94.3 kg)  12/22/18 207 lb (93.9 kg)  12/08/18 206 lb (93.4 kg)  He did not take BP meds this am.  Constitutional: overweight, in NAD Eyes: PERRLA, EOMI, no exophthalmos ENT: moist mucous membranes, no thyromegaly, no cervical lymphadenopathy Cardiovascular: RRR, No MRG Respiratory: CTA B Gastrointestinal: abdomen soft, NT, ND, BS+ Musculoskeletal: no deformities, strength intact in all 4 Skin: moist, warm, no rashes Neurological: no tremor with outstretched hands, DTR normal in all 4  ASSESSMENT: 1. DM2, non-insulin-dependent, uncontrolled, with complications - CAD - cardiologist Dr. Ellyn Hack - CKD - was seeing Dr Lowanda Foster >> will change  2. HL  3.  Obesity class I  PLAN:  1. Patient with longstanding, previously uncontrolled type 2 diabetes, with much improved control in the last 2.5 years.  He was previously on glipizide ER, which he was able to stop in 2017.  He was also on Metformin ER but he had to stop due to CKD in 07/2018.  He now continues only on Januvia with very good control. Approximately 2.5 months ago he had another HbA1c which was even better than before, at 6.3%. -At last visit, sugars are higher after he had prednisone and a steroid injection for gout.  I advised him to decrease that and stopping juice.  We did not change his regimen otherwise. -At this visit, sugars remain at goal for the records that he brings with him.  He does not check sugars quite every day but the ones that he checks are well into the normal range so I am surprised why his HbA1c is actually higher: 6.6%  -I do not feel that we need to change his regimen for now but he tells me the  Januvia is quite expensive.  He will try to fill the  prescription today, as this may be cheaper after the first of the year, but if not, he will get in touch with me and will tell me which alternative DPP 4 inhibitors are covered. -He also tells me that he started to drink sweet tea after last visit and I advised him that he will need to stop right away since this will greatly worsen his diabetes control - I suggested to:  Patient Instructions  Continue Januvia 100 mg before breakfast.  STOP SWEET TEA!  Please return in 4 months with your sugar log.   - advised to check sugars at different times of the day - 1x a day, rotating check times - advised for yearly eye exams >> he is UTD - return to clinic in 4 months    2. HL - Reviewed latest lipid panel from 11/2018: LDL above goal, HDL and triglycerides at goal Lab Results  Component Value Date   CHOL 199 12/08/2018   HDL 66 12/08/2018   LDLCALC 109 (H) 12/08/2018   TRIG 128 12/08/2018   CHOLHDL 3.0 12/08/2018  - Continues high-dose rosuvastatin without side effects.  She does have myalgias, unclear if from the statin.  3. Obesity class I -Before last visit, he gained 10 pounds as he could not go to the gym due to the coronavirus pandemic. -We will continue Januvia, which is weight neutral.  We will try to avoid insulin or sulfonylureas, which have weight gaining effects.  Carlus Pavlov, MD PhD [facility]

## 2019-02-13 NOTE — Addendum Note (Signed)
Addended by: Darliss Ridgel I on: 02/13/2019 12:01 PM   Modules accepted: Orders

## 2019-02-13 NOTE — Patient Instructions (Signed)
Continue Januvia 100 mg before breakfast.  STOP SWEET TEA!  Please return in 4 months with your sugar log.

## 2019-02-19 ENCOUNTER — Ambulatory Visit (INDEPENDENT_AMBULATORY_CARE_PROVIDER_SITE_OTHER): Payer: Medicare Other | Admitting: Family Medicine

## 2019-02-19 ENCOUNTER — Other Ambulatory Visit: Payer: Self-pay

## 2019-02-19 ENCOUNTER — Encounter: Payer: Self-pay | Admitting: Family Medicine

## 2019-02-19 VITALS — BP 154/82 | HR 70 | Temp 97.7°F | Resp 16 | Ht 69.0 in | Wt 207.0 lb

## 2019-02-19 DIAGNOSIS — Z8739 Personal history of other diseases of the musculoskeletal system and connective tissue: Secondary | ICD-10-CM | POA: Diagnosis not present

## 2019-02-19 DIAGNOSIS — E1122 Type 2 diabetes mellitus with diabetic chronic kidney disease: Secondary | ICD-10-CM

## 2019-02-19 DIAGNOSIS — N183 Chronic kidney disease, stage 3 unspecified: Secondary | ICD-10-CM

## 2019-02-19 DIAGNOSIS — I2510 Atherosclerotic heart disease of native coronary artery without angina pectoris: Secondary | ICD-10-CM | POA: Diagnosis not present

## 2019-02-19 DIAGNOSIS — E119 Type 2 diabetes mellitus without complications: Secondary | ICD-10-CM | POA: Diagnosis not present

## 2019-02-19 DIAGNOSIS — I10 Essential (primary) hypertension: Secondary | ICD-10-CM

## 2019-02-19 LAB — URINALYSIS, ROUTINE W REFLEX MICROSCOPIC
Bacteria, UA: NONE SEEN /HPF
Bilirubin Urine: NEGATIVE
Glucose, UA: NEGATIVE
Ketones, ur: NEGATIVE
Leukocytes,Ua: NEGATIVE
Nitrite: NEGATIVE
RBC / HPF: NONE SEEN /HPF (ref 0–2)
Specific Gravity, Urine: 1.02 (ref 1.001–1.03)
Squamous Epithelial / HPF: NONE SEEN /HPF (ref <=5)
WBC, UA: NONE SEEN /HPF (ref 0–5)
pH: 5.5 (ref 5.0–8.0)

## 2019-02-19 LAB — MICROSCOPIC MESSAGE

## 2019-02-19 MED ORDER — ATENOLOL 50 MG PO TABS: 50.0000 mg | ORAL_TABLET | Freq: Every day | ORAL | 3 refills | Status: DC

## 2019-02-19 NOTE — Progress Notes (Signed)
Subjective:    Patient ID: James Jones, male    DOB: 03-28-1945, 74 y.o.   MRN: [id number]  Medication Refill    Please see the office visit from November.  Patient has stayed off his statin medication since I last saw him and he states that his myalgias are better.  Unfortunately the patient has known coronary artery disease with a 60 to 70% stenosis in the right coronary artery and is also a diabetic.  Therefore, cardiovascular prevention is suboptimal.  Also blood pressure today is elevated at 154/82.  Patient denies any chest pain shortness of breath or dyspnea on exertion.  He denies any myalgias today other than normal arthritic pain.  His hemoglobin A1c was recently checked by his endocrinologist and found to be 6.6.  He is doing well on allopurinol which he takes for gout.  He has not had a gout flare since starting the allopurinol.  He has known stage III chronic kidney disease with a baseline creatinine of 1.6.  He is on maximum dose valsartan.  He is unable to take hydrochlorothiazide due to his gout.  He is due for a flu shot today but he elects to hold on his flu shot until he receives the Covid vaccine later this week. Past Medical History:  Diagnosis Date  . 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 Coronary artery disease, non-occlusive 03/18/2012   60-70% stenosis in RCA -- FFR 0.84; EF 60-65%  . Diabetes mellitus without complication (HCC)   . GERD (gastroesophageal reflux disease)    On PPI  . History of GI bleed January 2015   No obvious findings on EGD/colonoscopy  . Hydrocele, bilateral    large on [place] 2020  . Hyperlipidemia   . Hypertension   . OSA (obstructive sleep apnea) 03/18/2012   Doing better with CPAP  . Pulmonary hypertension (HCC) 12/10/2010   ECHO:  Mild PH,mild LVH; PA pressures estimated 30-40 mmHg  . 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   Event monitor; usually during sleeping hours; therefore not on beta  blocker   Past Surgical History:  Procedure Laterality Date  . APPENDECTOMY  1974  . CARPAL TUNNEL RELEASE    . EYE SURGERY  1960   Left eye  . HEMORRHOID SURGERY    . LEFT HEART CATH AND CORONARY ANGIOGRAPHY  03/27/2010   Moderate mid RCA lesion,right radial approach,normal EF  . LEFT HEART CATHETERIZATION WITH CORONARY ANGIOGRAM N/A 03/17/2012   Procedure: LEFT HEART CATHETERIZATION WITH CORONARY ANGIOGRAM;  Surgeon: Marykay Lex, MD;  Location: [facility] CATH LAB;  Service: Cardiovascular: Fractional Flow Reserve Measurement of mid RCA 60-70% stenosis = 0.84. Otherwise mild LCA CAD.  Normal EF & EDP  . SHOULDER ARTHROSCOPY  2006  . TRANSTHORACIC ECHOCARDIOGRAM  12/2010    Mild concentric LVH.  EF> 55%.  GR 1 DD.  Mild aortic sclerosis no stenosis.  PA pressures estimated 30-40 mmHg   Current Outpatient Medications on File Prior to Visit  Medication Sig Dispense Refill  . allopurinol (ZYLOPRIM) 100 MG tablet Take 1 tablet (100 mg total) by mouth daily. 30 tablet 6  . amLODipine (NORVASC) 10 MG tablet Take 1 tablet (10 mg total) by mouth daily. 90 tablet 3  . Blood Glucose Monitoring Suppl (FREESTYLE LITE) DEVI Use to check blood sugar once daily 1 each 0  . Cholecalciferol (VITAMIN D-3) 1000 UNITS CAPS Take by mouth daily. 2 capsules.    . colchicine 0.6 MG  tablet Take 1 tablet (0.6 mg total) by mouth daily. 30 tablet 2  . colchicine 0.6 MG tablet Take 1 tablet (0.6 mg total) by mouth daily. Generic only, not colcrys 30 tablet 2  . ferrous sulfate 325 (65 FE) MG tablet Take 1 tablet (325 mg total) by mouth 2 (two) times daily with a meal.  3  . fish oil-omega-3 fatty acids 1000 MG capsule Take 1 g by mouth daily.    Marland Kitchen glucose blood (FREESTYLE LITE) test strip USE TO CHECK BLOOD SUGAR ONCE DAILY. E11.59 100 strip 12  . JANUVIA 100 MG tablet TAKE 1 TABLET BY MOUTH EVERY DAY 90 tablet 1  . Multiple Vitamin (MULTIVITAMIN WITH MINERALS) TABS Take 1 tablet by mouth daily.    . Potassium 99 MG TABS  Take 99 mg by mouth daily.     Marland Kitchen PRESCRIPTION MEDICATION Uses C-PAP at bedtime    . valsartan (DIOVAN) 320 MG tablet Take 1 tablet (320 mg total) by mouth daily. 90 tablet 3   No current facility-administered medications on file prior to visit.   No Known Allergies Social History   Socioeconomic History  . Marital status: Married    Spouse name: Not on file  . Number of children: Not on file  . Years of education: Not on file  . Highest education level: Not on file  Occupational History  . Not on file  Tobacco Use  . Smoking status: Former Smoker    Packs/day: 1.00    Types: Cigarettes    Quit date: 04/12/2012    Years since quitting: 6.8  . Smokeless tobacco: Never Used  Substance and Sexual Activity  . Alcohol use: Yes    Alcohol/week: 1.0 standard drinks    Types: 1 Standard drinks or equivalent per week  . Drug use: Yes    Types: Marijuana    Comment: cannibus  . Sexual activity: Not on file  Other Topics Concern  . Not on file  Social History Narrative   Married, father of 2, grandfather of 3.   He is a former smoker of about a pack to pack and half cigarettes a day -- he quit in March of this year.    He is an avid exerciser working at least 4-5 days a week doing her walking or stationary bike.   Social Determinants of Health   Financial Resource Strain:   . Difficulty of Paying Living Expenses: Not on file  Food Insecurity:   . Worried About Programme researcher, broadcasting/film/video in the Last Year: Not on file  . Ran Out of Food in the Last Year: Not on file  Transportation Needs:   . Lack of Transportation (Medical): Not on file  . Lack of Transportation (Non-Medical): Not on file  Physical Activity:   . Days of Exercise per Week: Not on file  . Minutes of Exercise per Session: Not on file  Stress:   . Feeling of Stress : Not on file  Social Connections:   . Frequency of Communication with Friends and Family: Not on file  . Frequency of Social Gatherings with Friends and  Family: Not on file  . Attends Religious Services: Not on file  . Active Member of Clubs or Organizations: Not on file  . Attends Banker Meetings: Not on file  . Marital Status: Not on file  Intimate Partner Violence:   . Fear of Current or Ex-Partner: Not on file  . Emotionally Abused: Not on file  .  Physically Abused: Not on file  . Sexually Abused: Not on file      Review of Systems  All other systems reviewed and are negative.      Objective:   Physical Exam  Constitutional: He appears well-developed and well-nourished.  Neck: No JVD present. No thyromegaly present.  Cardiovascular: Normal rate, regular rhythm, normal heart sounds and intact distal pulses.  No murmur heard. Pulmonary/Chest: Effort normal and breath sounds normal. No respiratory distress. He has no wheezes. He has no rales.  Abdominal: Soft. Bowel sounds are normal. He exhibits no distension and no mass. There is no abdominal tenderness. There is no rebound and no guarding.  Musculoskeletal:        General: No edema.     Cervical back: Neck supple.  Lymphadenopathy:    He has no cervical adenopathy.  Vitals reviewed.         Assessment & Plan:  CKD stage 3 due to type 2 diabetes mellitus ([place]) - Plan: Urinalysis, Routine w reflex microscopic, COMPLETE METABOLIC PANEL WITH GFR, CBC with Differential, Lipid Panel  History of gout - Plan: Uric Acid  Essential hypertension - Plan: Urinalysis, Routine w reflex microscopic, COMPLETE METABOLIC PANEL WITH GFR, CBC with Differential, Lipid Panel  ASCVD (arteriosclerotic cardiovascular disease)  Controlled type 2 diabetes mellitus without complication, without long-term current use of insulin (HCC)  Check uric acid level.  Goal uric acid level is less than 6.  Increase allopurinol to achieve this.  Blood pressure is elevated.  Add atenolol given his history of coronary artery disease to his amlodipine and valsartan and recheck blood pressure  in 1 month.  He will take atenolol 50 mg a day.  Diabetes is currently well controlled with a hemoglobin A1c of 6.6.  However cardiovascular prevention is suboptimal now that the patient is no longer on a statin.  Check fasting lipid panel to obtain a baseline but I suspect that we will start the patient on a low-dose pravastatin to see if he can tolerate this better than the rosuvastatin.  Monitor renal function with a CMP.  Patient will receive Covid vaccine later this week

## 2019-02-20 LAB — LIPID PANEL
Cholesterol: 291 mg/dL — ABNORMAL HIGH (ref <200)
HDL: 51 mg/dL (ref >=40)
LDL Cholesterol (Calc): 209 mg/dL (calc) — ABNORMAL HIGH
Non-HDL Cholesterol (Calc): 240 mg/dL (calc) — ABNORMAL HIGH (ref <130)
Total CHOL/HDL Ratio: 5.7 (calc) — ABNORMAL HIGH (ref <5.0)
Triglycerides: 150 mg/dL — ABNORMAL HIGH (ref <150)

## 2019-02-20 LAB — COMPLETE METABOLIC PANEL WITH GFR
AG Ratio: 1.8 (calc) (ref 1.0–2.5)
ALT: 20 U/L (ref 9–46)
AST: 26 U/L (ref 10–35)
Albumin: 4.4 g/dL (ref 3.6–5.1)
Alkaline phosphatase (APISO): 76 U/L (ref 35–144)
BUN/Creatinine Ratio: 15 (calc) (ref 6–22)
BUN: 24 mg/dL (ref 7–25)
CO2: 21 mmol/L (ref 20–32)
Calcium: 10.1 mg/dL (ref 8.6–10.3)
Chloride: 107 mmol/L (ref 98–110)
Creat: 1.64 mg/dL — ABNORMAL HIGH (ref 0.70–1.18)
GFR, Est African American: 47 mL/min/{1.73_m2} — ABNORMAL LOW (ref >=60)
GFR, Est Non African American: 40 mL/min/{1.73_m2} — ABNORMAL LOW (ref >=60)
Globulin: 2.5 g/dL (calc) (ref 1.9–3.7)
Glucose, Bld: 112 mg/dL — ABNORMAL HIGH (ref 65–99)
Potassium: 4.2 mmol/L (ref 3.5–5.3)
Sodium: 141 mmol/L (ref 135–146)
Total Bilirubin: 0.3 mg/dL (ref 0.2–1.2)
Total Protein: 6.9 g/dL (ref 6.1–8.1)

## 2019-02-20 LAB — CBC WITH DIFFERENTIAL/PLATELET
Absolute Monocytes: 604 cells/uL (ref 200–950)
Basophils Absolute: 34 cells/uL (ref 0–200)
Basophils Relative: 0.4 %
Eosinophils Absolute: 315 cells/uL (ref 15–500)
Eosinophils Relative: 3.7 %
HCT: 41.8 % (ref 38.5–50.0)
Hemoglobin: 13.9 g/dL (ref 13.2–17.1)
Lymphs Abs: 1309 cells/uL (ref 850–3900)
MCH: 27.9 pg (ref 27.0–33.0)
MCHC: 33.3 g/dL (ref 32.0–36.0)
MCV: 83.9 fL (ref 80.0–100.0)
MPV: 10.1 fL (ref 7.5–12.5)
Monocytes Relative: 7.1 %
Neutro Abs: 6239 cells/uL (ref 1500–7800)
Neutrophils Relative %: 73.4 %
Platelets: 261 10*3/uL (ref 140–400)
RBC: 4.98 10*6/uL (ref 4.20–5.80)
RDW: 14.6 % (ref 11.0–15.0)
Total Lymphocyte: 15.4 %
WBC: 8.5 10*3/uL (ref 3.8–10.8)

## 2019-02-20 LAB — URIC ACID: Uric Acid, Serum: 5.7 mg/dL (ref 4.0–8.0)

## 2019-02-22 ENCOUNTER — Other Ambulatory Visit: Payer: Self-pay

## 2019-02-22 MED ORDER — PRAVASTATIN SODIUM 40 MG PO TABS: 40.0000 mg | ORAL_TABLET | Freq: Every day | ORAL | 0 refills | Status: DC

## 2019-02-23 ENCOUNTER — Other Ambulatory Visit: Payer: Self-pay | Admitting: Family Medicine

## 2019-03-06 ENCOUNTER — Other Ambulatory Visit: Payer: Self-pay | Admitting: Family Medicine

## 2019-03-07 ENCOUNTER — Telehealth: Payer: Self-pay | Admitting: Family Medicine

## 2019-03-07 NOTE — Telephone Encounter (Signed)
C-

## 2019-03-07 NOTE — Telephone Encounter (Signed)
Patient decided after I opened up telephone note that he would like an appointment with provider.

## 2019-03-08 ENCOUNTER — Other Ambulatory Visit: Payer: Self-pay

## 2019-03-08 ENCOUNTER — Telehealth: Payer: Self-pay | Admitting: Family Medicine

## 2019-03-08 ENCOUNTER — Encounter: Payer: Self-pay | Admitting: Family Medicine

## 2019-03-08 ENCOUNTER — Ambulatory Visit (INDEPENDENT_AMBULATORY_CARE_PROVIDER_SITE_OTHER): Payer: Medicare Other | Admitting: Family Medicine

## 2019-03-08 VITALS — BP 140/80 | HR 70 | Temp 96.9°F | Resp 18 | Ht 69.0 in | Wt 207.0 lb

## 2019-03-08 DIAGNOSIS — I2510 Atherosclerotic heart disease of native coronary artery without angina pectoris: Secondary | ICD-10-CM

## 2019-03-08 DIAGNOSIS — M10061 Idiopathic gout, right knee: Secondary | ICD-10-CM | POA: Diagnosis not present

## 2019-03-08 MED ORDER — PREDNISONE 20 MG PO TABS: ORAL_TABLET | ORAL | 0 refills | Status: DC

## 2019-03-08 NOTE — Telephone Encounter (Signed)
Patient calling for refill on his prednisone

## 2019-03-08 NOTE — Telephone Encounter (Signed)
Pt has an apt today.

## 2019-03-08 NOTE — Progress Notes (Signed)
Subjective:    Patient ID: James Jones, male    DOB: 05/14/1945, 74 y.o.   MRN: [id number]  HPI  Patient reports sudden onset of pain in his right knee.  Pain has been present now for last 3 days.  The knee is red hot and swollen.  It hurts to move.  He denies any falls or injuries.  He denies any cuts or penetrating trauma.  The knee is indurated and erythematous and warm to the touch with a moderate joint effusion. Past Medical History:  Diagnosis Date  . 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 Coronary artery disease, non-occlusive 03/18/2012   60-70% stenosis in RCA -- FFR 0.84; EF 60-65%  . Diabetes mellitus without complication (HCC)   . GERD (gastroesophageal reflux disease)    On PPI  . History of GI bleed January 2015   No obvious findings on EGD/colonoscopy  . Hydrocele, bilateral    large on [place] 2020  . Hyperlipidemia   . Hypertension   . OSA (obstructive sleep apnea) 03/18/2012   Doing better with CPAP  . Pulmonary hypertension (HCC) 12/10/2010   ECHO:  Mild PH,mild LVH; PA pressures estimated 30-40 mmHg  . 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   Event monitor; usually during sleeping hours; therefore not on beta blocker    Past Surgical History:  Procedure Laterality Date  . APPENDECTOMY  1974  . CARPAL TUNNEL RELEASE    . EYE SURGERY  1960   Left eye  . HEMORRHOID SURGERY    . LEFT HEART CATH AND CORONARY ANGIOGRAPHY  03/27/2010   Moderate mid RCA lesion,right radial approach,normal EF  . LEFT HEART CATHETERIZATION WITH CORONARY ANGIOGRAM N/A 03/17/2012   Procedure: LEFT HEART CATHETERIZATION WITH CORONARY ANGIOGRAM;  Surgeon: Marykay Lex, MD;  Location: [facility] CATH LAB;  Service: Cardiovascular: Fractional Flow Reserve Measurement of mid RCA 60-70% stenosis = 0.84. Otherwise mild LCA CAD.  Normal EF & EDP  . SHOULDER ARTHROSCOPY  2006  . TRANSTHORACIC ECHOCARDIOGRAM  12/2010    Mild concentric LVH.  EF> 55%.  GR 1 DD.   Mild aortic sclerosis no stenosis.  PA pressures estimated 30-40 mmHg   Current Outpatient Medications on File Prior to Visit  Medication Sig Dispense Refill  . allopurinol (ZYLOPRIM) 100 MG tablet TAKE 1 TABLET BY MOUTH EVERY DAY 90 tablet 2  . amLODipine (NORVASC) 10 MG tablet Take 1 tablet (10 mg total) by mouth daily. 90 tablet 3  . atenolol (TENORMIN) 50 MG tablet Take 1 tablet (50 mg total) by mouth daily. 90 tablet 3  . Blood Glucose Monitoring Suppl (FREESTYLE LITE) DEVI Use to check blood sugar once daily 1 each 0  . Cholecalciferol (VITAMIN D-3) 1000 UNITS CAPS Take by mouth daily. 2 capsules.    . colchicine 0.6 MG tablet Take 1 tablet (0.6 mg total) by mouth daily. 30 tablet 2  . colchicine 0.6 MG tablet Take 1 tablet (0.6 mg total) by mouth daily. Generic only, not colcrys 30 tablet 2  . ferrous sulfate 325 (65 FE) MG tablet Take 1 tablet (325 mg total) by mouth 2 (two) times daily with a meal.  3  . fish oil-omega-3 fatty acids 1000 MG capsule Take 1 g by mouth daily.    Marland Kitchen glucose blood (FREESTYLE LITE) test strip USE TO CHECK BLOOD SUGAR ONCE DAILY. E11.59 100 strip 12  . JANUVIA 100 MG tablet TAKE 1 TABLET BY MOUTH EVERY DAY 90  tablet 1  . Multiple Vitamin (MULTIVITAMIN WITH MINERALS) TABS Take 1 tablet by mouth daily.    . Potassium 99 MG TABS Take 99 mg by mouth daily.     . pravastatin (PRAVACHOL) 40 MG tablet Take 1 tablet (40 mg total) by mouth daily. 90 tablet 0  . PRESCRIPTION MEDICATION Uses C-PAP at bedtime    . valsartan (DIOVAN) 320 MG tablet Take 1 tablet (320 mg total) by mouth daily. 90 tablet 3   No current facility-administered medications on file prior to visit.   No Known Allergies Social History   Socioeconomic History  . Marital status: Married    Spouse name: Not on file  . Number of children: Not on file  . Years of education: Not on file  . Highest education level: Not on file  Occupational History  . Not on file  Tobacco Use  . Smoking  status: Former Smoker    Packs/day: 1.00    Types: Cigarettes    Quit date: 04/12/2012    Years since quitting: 6.9  . Smokeless tobacco: Never Used  Substance and Sexual Activity  . Alcohol use: Yes    Alcohol/week: 1.0 standard drinks    Types: 1 Standard drinks or equivalent per week  . Drug use: Yes    Types: Marijuana    Comment: cannibus  . Sexual activity: Not on file  Other Topics Concern  . Not on file  Social History Narrative   Married, father of 2, grandfather of 3.   He is a former smoker of about a pack to pack and half cigarettes a day -- he quit in March of this year.    He is an avid exerciser working at least 4-5 days a week doing her walking or stationary bike.   Social Determinants of Health   Financial Resource Strain:   . Difficulty of Paying Living Expenses: Not on file  Food Insecurity:   . Worried About Charity fundraiser in the Last Year: Not on file  . Ran Out of Food in the Last Year: Not on file  Transportation Needs:   . Lack of Transportation (Medical): Not on file  . Lack of Transportation (Non-Medical): Not on file  Physical Activity:   . Days of Exercise per Week: Not on file  . Minutes of Exercise per Session: Not on file  Stress:   . Feeling of Stress : Not on file  Social Connections:   . Frequency of Communication with Friends and Family: Not on file  . Frequency of Social Gatherings with Friends and Family: Not on file  . Attends Religious Services: Not on file  . Active Member of Clubs or Organizations: Not on file  . Attends Archivist Meetings: Not on file  . Marital Status: Not on file  Intimate Partner Violence:   . Fear of Current or Ex-Partner: Not on file  . Emotionally Abused: Not on file  . Physically Abused: Not on file  . Sexually Abused: Not on file      Review of Systems  All other systems reviewed and are negative.      Objective:   Physical Exam  Constitutional: He appears well-developed and  well-nourished.  Neck: No JVD present. No thyromegaly present.  Cardiovascular: Normal rate, regular rhythm, normal heart sounds and intact distal pulses.  No murmur heard. Pulmonary/Chest: Effort normal and breath sounds normal. No respiratory distress. He has no wheezes. He has no rales.  Abdominal: Soft. Bowel  sounds are normal.  Musculoskeletal:        General: No edema.     Cervical back: Neck supple.     Right knee: Swelling and effusion present. Decreased range of motion. Tenderness present.  Lymphadenopathy:    He has no cervical adenopathy.  Vitals reviewed.         Assessment & Plan:  Acute idiopathic gout of right knee  Begin prednisone taper pack as soon as possible.  Return for cortisone injection if no better by next week.

## 2019-03-26 ENCOUNTER — Telehealth: Payer: Self-pay | Admitting: Internal Medicine

## 2019-03-26 NOTE — Telephone Encounter (Signed)
Can we try Tradjenta 10 mg daily?  If this is not covered, either, we can try to go back to glipizide XL 2.5 mg daily before breakfast.

## 2019-03-26 NOTE — Telephone Encounter (Signed)
Patient  ph# [phone] called  re: Patient cannot afford Januvia (cost is $450.00 for 30 pills). Patient requests a RX for a more affordable medication be sent to:   [id number] [place], [place] - [address] Phone:  [phone]  Fax:  [phone]

## 2019-03-27 MED ORDER — LINAGLIPTIN 5 MG PO TABS: 10.0000 mg | ORAL_TABLET | Freq: Every day | ORAL | 1 refills | Status: DC

## 2019-03-27 NOTE — Telephone Encounter (Signed)
Tradjenta order to patient requested pharmacy, patient notified.

## 2019-03-28 MED ORDER — GLIPIZIDE ER 2.5 MG PO TB24: 2.5000 mg | ORAL_TABLET | Freq: Every day | ORAL | 2 refills | Status: DC

## 2019-03-28 NOTE — Telephone Encounter (Signed)
RX changed to Glipizide, FYI Dr. Elvera Lennox.

## 2019-03-28 NOTE — Addendum Note (Signed)
Addended by: Darliss Ridgel I on: 03/28/2019 10:53 AM   Modules accepted: Orders

## 2019-03-28 NOTE — Telephone Encounter (Signed)
Patient calling back today stating Trajenta is too expensive also it cost $320-patient is now requesting [facility]ide be [facility] [address] and he would like 90 day Rx

## 2019-04-17 ENCOUNTER — Ambulatory Visit (INDEPENDENT_AMBULATORY_CARE_PROVIDER_SITE_OTHER): Payer: Medicare Other | Admitting: Family Medicine

## 2019-04-17 ENCOUNTER — Encounter: Payer: Self-pay | Admitting: Family Medicine

## 2019-04-17 ENCOUNTER — Other Ambulatory Visit: Payer: Self-pay

## 2019-04-17 VITALS — BP 130/64 | HR 65 | Temp 98.7°F | Resp 15 | Ht 69.0 in | Wt 219.0 lb

## 2019-04-17 DIAGNOSIS — E7800 Pure hypercholesterolemia, unspecified: Secondary | ICD-10-CM

## 2019-04-17 DIAGNOSIS — N183 Chronic kidney disease, stage 3 unspecified: Secondary | ICD-10-CM

## 2019-04-17 DIAGNOSIS — Z125 Encounter for screening for malignant neoplasm of prostate: Secondary | ICD-10-CM | POA: Diagnosis not present

## 2019-04-17 DIAGNOSIS — E1122 Type 2 diabetes mellitus with diabetic chronic kidney disease: Secondary | ICD-10-CM

## 2019-04-17 DIAGNOSIS — I10 Essential (primary) hypertension: Secondary | ICD-10-CM | POA: Diagnosis not present

## 2019-04-17 DIAGNOSIS — E119 Type 2 diabetes mellitus without complications: Secondary | ICD-10-CM

## 2019-04-17 DIAGNOSIS — Z0000 Encounter for general adult medical examination without abnormal findings: Secondary | ICD-10-CM

## 2019-04-17 DIAGNOSIS — I2510 Atherosclerotic heart disease of native coronary artery without angina pectoris: Secondary | ICD-10-CM | POA: Diagnosis not present

## 2019-04-17 NOTE — Progress Notes (Signed)
Subjective:    Patient ID: James Jones, male    DOB: 08-18-1945, 74 y.o.   MRN: [id number]  HPI Patient is a very pleasant 74 year old African-American gentleman who is here today for physical exam.  I recently discontinued his statin due to severe myalgias.  However repeat fasting lipid panel in early January revealed an LDL cholesterol greater than 200!Marland Kitchen  Patient has diabetes although well controlled with an A1c of 6.6 in January.  He also has a history of coronary artery disease.  He has a history of hypertension.  All of these risk factors I believe put him at extremely high risk given that cholesterol and therefore I strongly encouraged the patient to take a statin.  He has been on pravastatin now for more than 6 weeks.  He denies any myalgias.  He seems to be tolerating the medication well without complication.  He is due to recheck a CMP and a fasting lipid panel today.  His last PSA to screen for prostate cancer was in March of last year.  Therefore he is due for this as well.  His last colonoscopy was in 2014.  He is not yet due for repeat colonoscopy.  Patient's immunizations are up-to-date except for the shingles vaccine, a booster on his tetanus shot, and a flu shot.  He politely declines a tetanus shot and a flu shot today.  He will consider shingles shot this summer after the Covid pandemic passes.  Fortunately the patient has been vaccinated for Covid.  He denies any falls, depression, or memory loss. Past Medical History:  Diagnosis Date  . 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 Coronary artery disease, non-occlusive 03/18/2012   60-70% stenosis in RCA -- FFR 0.84; EF 60-65%  . Diabetes mellitus without complication ([place])   . GERD (gastroesophageal reflux disease)    On PPI  . History of GI bleed January 2015   No obvious findings on EGD/colonoscopy  . Hydrocele, bilateral    large on [place] 2020  . Hyperlipidemia   . Hypertension   . OSA (obstructive  sleep apnea) 03/18/2012   Doing better with CPAP  . Pulmonary hypertension ([place]) 12/10/2010   ECHO:  Mild PH,mild LVH; PA pressures estimated 30-40 mmHg  . 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 APPENDECTOMY  1974  . CARPAL TUNNEL RELEASE    . EYE SURGERY  1960   Left eye  . HEMORRHOID SURGERY    . LEFT HEART CATH AND CORONARY ANGIOGRAPHY  03/27/2010   Moderate mid RCA lesion,right radial approach,normal EF  . LEFT HEART CATHETERIZATION WITH CORONARY ANGIOGRAM N/A 03/17/2012   Procedure: LEFT HEART CATHETERIZATION WITH CORONARY ANGIOGRAM;  Surgeon: Leonie Man, MD;  Location: [facility] CATH LAB;  Service: Cardiovascular: Fractional Flow Reserve Measurement of mid RCA 60-70% stenosis = 0.84. Otherwise mild LCA CAD.  Normal EF & EDP  . SHOULDER ARTHROSCOPY  2006  . TRANSTHORACIC ECHOCARDIOGRAM  12/2010    Mild concentric LVH.  EF> 55%.  GR 1 DD.  Mild aortic sclerosis no stenosis.  PA pressures estimated 30-40 mmHg   Current Outpatient Medications on File Prior to Visit  Medication Sig Dispense Refill  . allopurinol (ZYLOPRIM) 100 MG tablet TAKE 1 TABLET BY MOUTH EVERY DAY 90 tablet 2  . amLODipine (NORVASC) 10 MG tablet Take 1 tablet (10  mg total) by mouth daily. 90 tablet 3  . atenolol (TENORMIN) 50 MG tablet Take 1 tablet (50 mg total) by mouth daily. 90 tablet 3  . Blood Glucose Monitoring Suppl (FREESTYLE LITE) DEVI Use to check blood sugar once daily 1 each 0  . Cholecalciferol (VITAMIN D-3) 1000 UNITS CAPS Take by mouth daily. 2 capsules.    . ferrous sulfate 325 (65 FE) MG tablet Take 1 tablet (325 mg total) by mouth 2 (two) times daily with a meal.  3  . fish oil-omega-3 fatty acids 1000 MG capsule Take 1 g by mouth daily.    Marland Kitchen glipiZIDE (GLUCOTROL XL) 2.5 MG 24 hr tablet Take 1 tablet (2.5 mg total) by mouth daily with breakfast. 90 tablet  2  . glucose blood (FREESTYLE LITE) test strip USE TO CHECK BLOOD SUGAR ONCE DAILY. E11.59 100 strip 12  . Multiple Vitamin (MULTIVITAMIN WITH MINERALS) TABS Take 1 tablet by mouth daily.    . Potassium 99 MG TABS Take 99 mg by mouth daily.     . pravastatin (PRAVACHOL) 40 MG tablet Take 1 tablet (40 mg total) by mouth daily. 90 tablet 0  . PRESCRIPTION MEDICATION Uses C-PAP at bedtime    . valsartan (DIOVAN) 320 MG tablet Take 1 tablet (320 mg total) by mouth daily. 90 tablet 3   No current facility-administered medications on file prior to visit.   No Known Allergies Social History   Socioeconomic History  . Marital status: Married    Spouse name: Not on file  . Number of children: Not on file  . Years of education: Not on file  . Highest education level: Not on file  Occupational History  . Not on file  Tobacco Use  . Smoking status: Former Smoker    Packs/day: 1.00    Types: Cigarettes    Quit date: 04/12/2012    Years since quitting: 7.0  . Smokeless tobacco: Never Used  Substance and Sexual Activity  . Alcohol use: Yes    Alcohol/week: 1.0 standard drinks    Types: 1 Standard drinks or equivalent per week  . Drug use: Yes    Types: Marijuana    Comment: cannibus  . Sexual activity: Not on file  Other Topics Concern  . Not on file  Social History Narrative   Married, father of 2, grandfather of 3.   He is a former smoker of about a pack to pack and half cigarettes a day -- he quit in March of this year.    He is an avid exerciser working at least 4-5 days a week doing her walking or stationary bike.   Social Determinants of Health   Financial Resource Strain:   . Difficulty of Paying Living Expenses: Not on file  Food Insecurity:   . Worried About Programme researcher, broadcasting/film/video in the Last Year: Not on file  . Ran Out of Food in the Last Year: Not on file  Transportation Needs:   . Lack of Transportation (Medical): Not on file  . Lack of Transportation (Non-Medical): Not  on file  Physical Activity:   . Days of Exercise per Week: Not on file  . Minutes of Exercise per Session: Not on file  Stress:   . Feeling of Stress : Not on file  Social Connections:   . Frequency of Communication with Friends and Family: Not on file  . Frequency of Social Gatherings with Friends and Family: Not on file  . Attends Religious Services: Not  on file  . Active Member of Clubs or Organizations: Not on file  . Attends Banker Meetings: Not on file  . Marital Status: Not on file  Intimate Partner Violence:   . Fear of Current or Ex-Partner: Not on file  . Emotionally Abused: Not on file  . Physically Abused: Not on file  . Sexually Abused: Not on file   Family History  Problem Relation Age of Onset  . Kidney failure Mother   . Coronary artery disease Father   . Kidney failure Sister      Review of Systems  All other systems reviewed and are negative.      Objective:   Physical Exam  Constitutional: He is oriented to person, place, and time. He appears well-developed and well-nourished. No distress.  HENT:  Head: Normocephalic and atraumatic.  Right Ear: External ear normal.  Left Ear: External ear normal.  Nose: Nose normal.  Mouth/Throat: Oropharynx is clear and moist. No oropharyngeal exudate.  Eyes: Pupils are equal, round, and reactive to light. Conjunctivae and EOM are normal. Right eye exhibits no discharge. Left eye exhibits no discharge. No scleral icterus.  Neck: No JVD present. No tracheal deviation present. No thyromegaly present.  Cardiovascular: Normal rate, regular rhythm and intact distal pulses. Exam reveals no gallop and no friction rub.  Murmur heard. Pulmonary/Chest: Effort normal and breath sounds normal. No stridor. No respiratory distress. He has no wheezes. He has no rales. He exhibits no tenderness.  Abdominal: Soft. Bowel sounds are normal. He exhibits no distension and no mass. There is no abdominal tenderness. There is  no rebound and no guarding.  Musculoskeletal:        General: Edema present. No tenderness or deformity. Normal range of motion.     Cervical back: Normal range of motion and neck supple.  Lymphadenopathy:    He has no cervical adenopathy.  Neurological: He is alert and oriented to person, place, and time. He has normal reflexes. No cranial nerve deficit. He exhibits normal muscle tone. Coordination normal.  Skin: Skin is warm. No rash noted. He is not diaphoretic. No erythema. No pallor.  Psychiatric: He has a normal mood and affect. His behavior is normal. Judgment and thought content normal.  Vitals reviewed.         Assessment & Plan:  Controlled type 2 diabetes mellitus without complication, without long-term current use of insulin (HCC) - Plan: CBC with Differential/Platelet, COMPLETE METABOLIC PANEL WITH GFR, Lipid panel  CKD stage 3 due to type 2 diabetes mellitus (HCC) - Plan: CBC with Differential/Platelet, COMPLETE METABOLIC PANEL WITH GFR, Lipid panel  Essential hypertension - Plan: CBC with Differential/Platelet, COMPLETE METABOLIC PANEL WITH GFR, Lipid panel  ASCVD (arteriosclerotic cardiovascular disease) - Plan: CBC with Differential/Platelet, COMPLETE METABOLIC PANEL WITH GFR, Lipid panel  Pure hypercholesterolemia - Plan: CBC with Differential/Platelet, COMPLETE METABOLIC PANEL WITH GFR, Lipid panel  Prostate cancer screening - Plan: PSA  General medical exam Physical exam today is normal.  Blood pressure is excellent at 130/64.  Patient denies any depression, falls, or memory loss.  He politely declines a flu shot and a tetanus shot today.  He is already been vaccinated for Covid.  The remainder of his immunizations are up-to-date. Immunization History  Administered Date(s) Administered  . Influenza, High Dose Seasonal PF 11/24/2017  . Influenza,inj,Quad PF,6+ Mos 10/22/2014, 10/29/2015  . PFIZER SARS-COV-2 Vaccination 02/23/2019, 03/16/2019  . Pneumococcal  Conjugate-13 04/13/2016  . Pneumococcal Polysaccharide-23 02/09/2008, 04/14/2018  . Tdap  02/09/2008   Colonoscopy is not due until 2024.  I will screen for prostate cancer with PSA.  I will also check a CBC, CMP, and fasting lipid panel.  Given his history of coronary artery disease, goal LDL cholesterol is less than 70.  Hemoglobin A1c was just checked in January and was normal at 6.6.  He has a follow-up appointment with his endocrinologist in May and therefore I will defer a hemoglobin A1c at the present time.  Monitor for any evidence of anemia with a CBC.  Regular anticipatory guidance is provided.  Diabetic foot exam was performed today and was normal aside from some mild pitting edema but I would avoid diuretics in this patient given his history of gout.

## 2019-04-18 LAB — COMPLETE METABOLIC PANEL WITH GFR
AG Ratio: 1.8 (calc) (ref 1.0–2.5)
ALT: 15 U/L (ref 9–46)
AST: 22 U/L (ref 10–35)
Albumin: 4.1 g/dL (ref 3.6–5.1)
Alkaline phosphatase (APISO): 83 U/L (ref 35–144)
BUN/Creatinine Ratio: 11 (calc) (ref 6–22)
BUN: 18 mg/dL (ref 7–25)
CO2: 22 mmol/L (ref 20–32)
Calcium: 9.5 mg/dL (ref 8.6–10.3)
Chloride: 108 mmol/L (ref 98–110)
Creat: 1.71 mg/dL — ABNORMAL HIGH (ref 0.70–1.18)
GFR, Est African American: 44 mL/min/{1.73_m2} — ABNORMAL LOW (ref >=60)
GFR, Est Non African American: 38 mL/min/{1.73_m2} — ABNORMAL LOW (ref >=60)
Globulin: 2.3 g/dL (calc) (ref 1.9–3.7)
Glucose, Bld: 119 mg/dL — ABNORMAL HIGH (ref 65–99)
Potassium: 4 mmol/L (ref 3.5–5.3)
Sodium: 141 mmol/L (ref 135–146)
Total Bilirubin: 0.4 mg/dL (ref 0.2–1.2)
Total Protein: 6.4 g/dL (ref 6.1–8.1)

## 2019-04-18 LAB — CBC WITH DIFFERENTIAL/PLATELET
Absolute Monocytes: 685 cells/uL (ref 200–950)
Basophils Absolute: 71 cells/uL (ref 0–200)
Basophils Relative: 0.8 %
Eosinophils Absolute: 98 cells/uL (ref 15–500)
Eosinophils Relative: 1.1 %
HCT: 37.9 % — ABNORMAL LOW (ref 38.5–50.0)
Hemoglobin: 12.5 g/dL — ABNORMAL LOW (ref 13.2–17.1)
Lymphs Abs: 1255 cells/uL (ref 850–3900)
MCH: 27.8 pg (ref 27.0–33.0)
MCHC: 33 g/dL (ref 32.0–36.0)
MCV: 84.2 fL (ref 80.0–100.0)
MPV: 9.8 fL (ref 7.5–12.5)
Monocytes Relative: 7.7 %
Neutro Abs: 6791 cells/uL (ref 1500–7800)
Neutrophils Relative %: 76.3 %
Platelets: 263 10*3/uL (ref 140–400)
RBC: 4.5 10*6/uL (ref 4.20–5.80)
RDW: 14.5 % (ref 11.0–15.0)
Total Lymphocyte: 14.1 %
WBC: 8.9 10*3/uL (ref 3.8–10.8)

## 2019-04-18 LAB — LIPID PANEL
Cholesterol: 223 mg/dL — ABNORMAL HIGH (ref <200)
HDL: 52 mg/dL (ref >=40)
LDL Cholesterol (Calc): 141 mg/dL (calc) — ABNORMAL HIGH
Non-HDL Cholesterol (Calc): 171 mg/dL (calc) — ABNORMAL HIGH (ref <130)
Total CHOL/HDL Ratio: 4.3 (calc) (ref <5.0)
Triglycerides: 162 mg/dL — ABNORMAL HIGH (ref <150)

## 2019-04-18 LAB — PSA: PSA: 0.9 ng/mL (ref <=4.0)

## 2019-04-24 ENCOUNTER — Encounter: Payer: Self-pay | Admitting: Internal Medicine

## 2019-04-24 DIAGNOSIS — H04123 Dry eye syndrome of bilateral lacrimal glands: Secondary | ICD-10-CM | POA: Diagnosis not present

## 2019-04-24 DIAGNOSIS — E119 Type 2 diabetes mellitus without complications: Secondary | ICD-10-CM | POA: Diagnosis not present

## 2019-04-24 DIAGNOSIS — Z961 Presence of intraocular lens: Secondary | ICD-10-CM | POA: Diagnosis not present

## 2019-04-24 DIAGNOSIS — H524 Presbyopia: Secondary | ICD-10-CM | POA: Diagnosis not present

## 2019-04-24 LAB — HM DIABETES EYE EXAM

## 2019-05-08 ENCOUNTER — Telehealth: Payer: Self-pay | Admitting: Family Medicine

## 2019-05-08 MED ORDER — ATENOLOL 50 MG PO TABS: 50.0000 mg | ORAL_TABLET | Freq: Every day | ORAL | 3 refills | Status: DC

## 2019-05-08 MED ORDER — ALLOPURINOL 100 MG PO TABS: 100.0000 mg | ORAL_TABLET | Freq: Every day | ORAL | 2 refills | Status: DC

## 2019-05-08 NOTE — Telephone Encounter (Signed)
Medication called/sent to requested pharmacy  

## 2019-05-08 NOTE — Telephone Encounter (Signed)
Patient is calling to get refill on allopurinol and atenolol [facility] [phone]

## 2019-05-16 ENCOUNTER — Encounter: Payer: Self-pay | Admitting: Cardiology

## 2019-05-16 ENCOUNTER — Ambulatory Visit (INDEPENDENT_AMBULATORY_CARE_PROVIDER_SITE_OTHER): Payer: Medicare Other | Admitting: Cardiology

## 2019-05-16 ENCOUNTER — Other Ambulatory Visit: Payer: Self-pay

## 2019-05-16 VITALS — BP 133/67 | HR 60 | Ht 69.0 in | Wt 220.4 lb

## 2019-05-16 DIAGNOSIS — I4510 Unspecified right bundle-branch block: Secondary | ICD-10-CM

## 2019-05-16 DIAGNOSIS — E785 Hyperlipidemia, unspecified: Secondary | ICD-10-CM

## 2019-05-16 DIAGNOSIS — I2510 Atherosclerotic heart disease of native coronary artery without angina pectoris: Secondary | ICD-10-CM | POA: Diagnosis not present

## 2019-05-16 DIAGNOSIS — I441 Atrioventricular block, second degree: Secondary | ICD-10-CM

## 2019-05-16 DIAGNOSIS — I10 Essential (primary) hypertension: Secondary | ICD-10-CM

## 2019-05-16 NOTE — Progress Notes (Signed)
Primary Care Provider: Donita Brooks, MD Cardiologist: Bryan Lemma, MD Electrophysiologist: None  Clinic Note: Chief Complaint  Patient presents with  . Annual Exam    Doing well.  No major complaints  . Coronary Artery Disease    No angina    HPI:    James Jones is a 74 y.o. male with a PMH notable for NON-OCCLUSIVE SINGLE VESSEL CAD & significant CRFs (DM-2, HTN & HLD) with a soft MURMUR, as well as CKD-3 who presents for annual follow-up.   CATH 03/2012: 60-70% RCA lesion.  FFR 0.84.  James Jones was last seen on March 20, 2018 stating he was doing very well.  Maybe not quite like he was in his 79s again but doing fairly well.  Doing routine exercise.  He put on some weight but was working to cut that down.  Recent Hospitalizations: None  Reviewed  CV studies:    The following studies were reviewed today: (if available, images/films reviewed: From Epic Chart or Care Everywhere) . None:  Interval History:   [name] return is here today still doing well.  He says maybe he feels like he is 48 again.  Not quite as much energy as he had a couple years ago.  But still doing well.  He thinks a lot of his sensation of feeling a bit less energy is the fact that he probably should go to the gym 5 days a week as opposed to 3 days.  He says he goes to the [facility] and does walking on the track for several miles as well as other additional exercises.  He then sometimes walks around the neighborhood.  Not having any anginal symptoms with rest or exertion.  Just not as much exercise tolerance that he previously had. No heart failure symptoms or palpitations.  We had stopped his atorvastatin because of myalgias and potential memory issues and he says he really had not had any real change in symptoms.  He still has some muscle aches off and on and no will change to memory issues.  Cardiovascular Review of Symptoms (Summary): no chest pain or dyspnea on exertion positive for -  Just a little bit of reduced exercise tolerance negative for - edema, irregular heartbeat, orthopnea, palpitations, paroxysmal nocturnal dyspnea, rapid heart rate, shortness of breath or Syncope/near syncope, TIA/amaurosis fugax.  Claudication  The patient does not have symptoms concerning for COVID-19 infection (fever, chills, cough, or new shortness of breath).  The patient is practicing social distancing & Masking.   He has had both COVID-19 vaccine injections.  Did well.  REVIEWED OF SYSTEMS   Review of Systems  Constitutional: Negative for malaise/fatigue and weight loss (Still gaining weight.  Trying now to lose).  HENT: Negative for congestion and nosebleeds.   Gastrointestinal: Positive for heartburn (Pretty significant episodes if he eats the wrong thing.). Negative for blood in stool and melena.  Genitourinary: Negative for hematuria.  Musculoskeletal: Negative for falls and joint pain (Normal arthritis pains).  Neurological: Negative for dizziness and focal weakness.  Psychiatric/Behavioral: Negative.     I have reviewed and (if needed) personally updated the patient's problem list, medications, allergies, past medical and surgical history, social and family history.   PAST MEDICAL HISTORY   Past Medical History:  Diagnosis Date  . 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 Coronary artery disease, non-occlusive 03/18/2012   60-70% stenosis in RCA -- FFR 0.84; EF 60-65%  . Diabetes mellitus without  complication (HCC)   . GERD (gastroesophageal reflux disease)    On PPI  . History of GI bleed January 2015   No obvious findings on EGD/colonoscopy  . Hydrocele, bilateral    large on [place] 2020  . Hyperlipidemia   . Hypertension   . OSA (obstructive sleep apnea) 03/18/2012   Doing better with CPAP  . Pulmonary hypertension (HCC) 12/10/2010   ECHO:  Mild PH,mild LVH; PA pressures estimated 30-40 mmHg  . 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 Wenckebach  3/2-05/09/2012   Event monitor; usually during sleeping hours; therefore not on beta blocker    PAST SURGICAL HISTORY   Past Surgical History:  Procedure Laterality Date  . APPENDECTOMY  1974  . CARPAL TUNNEL RELEASE    . EYE SURGERY  1960   Left eye  . HEMORRHOID SURGERY    . LEFT HEART CATH AND CORONARY ANGIOGRAPHY  03/27/2010   Moderate mid RCA lesion,right radial approach,normal EF  . LEFT HEART CATHETERIZATION WITH CORONARY ANGIOGRAM N/A 03/17/2012   Procedure: LEFT HEART CATHETERIZATION WITH CORONARY ANGIOGRAM;  Surgeon: Marykay Lex, MD;  Location: [facility] CATH LAB;  Service: Cardiovascular: Fractional Flow Reserve Measurement of mid RCA 60-70% stenosis = 0.84. Otherwise mild LCA CAD.  Normal EF & EDP  . SHOULDER ARTHROSCOPY  2006  . TRANSTHORACIC ECHOCARDIOGRAM  12/2010    Mild concentric LVH.  EF> 55%.  GR 1 DD.  Mild aortic sclerosis no stenosis.  PA pressures estimated 30-40 mmHg    MEDICATIONS/ALLERGIES   Current Meds  Medication Sig  . allopurinol (ZYLOPRIM) 100 MG tablet Take 1 tablet (100 mg total) by mouth daily.  Marland Kitchen amLODipine (NORVASC) 10 MG tablet Take 1 tablet (10 mg total) by mouth daily.  Marland Kitchen atenolol (TENORMIN) 50 MG tablet Take 1 tablet (50 mg total) by mouth daily.  . Blood Glucose Monitoring Suppl (FREESTYLE LITE) DEVI Use to check blood sugar once daily  . Cholecalciferol (VITAMIN D-3) 1000 UNITS CAPS Take by mouth daily. 2 capsules.  . ferrous sulfate 325 (65 FE) MG tablet Take 1 tablet (325 mg total) by mouth 2 (two) times daily with a meal.  . fish oil-omega-3 fatty acids 1000 MG capsule Take 1 g by mouth daily.  Marland Kitchen glipiZIDE (GLUCOTROL XL) 2.5 MG 24 hr tablet Take 1 tablet (2.5 mg total) by mouth daily with breakfast.  . glucose blood (FREESTYLE LITE) test strip USE TO CHECK BLOOD SUGAR ONCE DAILY. E11.59  . Multiple Vitamin (MULTIVITAMIN WITH MINERALS) TABS Take 1 tablet by mouth daily.  . Potassium 99 MG TABS Take 99 mg by mouth daily.   Marland Kitchen PRESCRIPTION  MEDICATION Uses C-PAP at bedtime  . valsartan (DIOVAN) 320 MG tablet Take 1 tablet (320 mg total) by mouth daily.  . [DISCONTINUED] pravastatin (PRAVACHOL) 40 MG tablet Take 1 tablet (40 mg total) by mouth daily.    No Known Allergies  SOCIAL HISTORY/FAMILY HISTORY   Reviewed in Epic:  Pertinent findings: No major changes.  Still exercises at the [facility] 3 days a week walks about 5 miles around the track and does other exercises well.  He sometimes also walks around the neighborhood.  OBJCTIVE -PE, EKG, labs   Wt Readings from Last 3 Encounters:  05/16/19 220 lb 6.4 oz (100 kg)  04/17/19 219 lb (99.3 kg)  03/08/19 207 lb (93.9 kg)    Physical Exam: BP 133/67   Pulse 60   Ht 5\' 9"  (1.753 m)   Wt 220 lb 6.4 oz (100 kg)  SpO2 97%   BMI 32.55 kg/m  Physical Exam  Constitutional: He is oriented to person, place, and time. He appears well-developed and well-nourished. No distress.  HENT:  Head: Normocephalic and atraumatic.  Eyes:  lazy eye  Neck: No hepatojugular reflux and no JVD present. Carotid bruit is not present.  Cardiovascular: Normal rate, regular rhythm and intact distal pulses. PMI is not displaced. Exam reveals no gallop and no friction rub.  Murmur (1/6 HSM at apex) heard. Normal S1 possible S2  Pulmonary/Chest: Effort normal and breath sounds normal. No respiratory distress. He has no wheezes.  Abdominal: Soft. Bowel sounds are normal. He exhibits no distension. There is no abdominal tenderness.  Musculoskeletal:        General: No edema. Normal range of motion.     Cervical back: Normal range of motion and neck supple.  Neurological: He is alert and oriented to person, place, and time.  Psychiatric: He has a normal mood and affect. His behavior is normal. Judgment and thought content normal.  Vitals reviewed.   Adult ECG Report  Rate: 56 ;  Rhythm: sinus bradycardia and RBBB.  Otherwise T wave inversions related to repolarization changes but cannot exclude  ischemia in the inferior leads.;   Narrative Interpretation: Stable EKG.  Recent Labs:    Lab Results  Component Value Date   CHOL 223 (H) 04/17/2019   HDL 52 04/17/2019   LDLCALC 141 (H) 04/17/2019   TRIG 162 (H) 04/17/2019   CHOLHDL 4.3 04/17/2019   Lab Results  Component Value Date   CREATININE 1.71 (H) 04/17/2019   BUN 18 04/17/2019   NA 141 04/17/2019   K 4.0 04/17/2019   CL 108 04/17/2019   CO2 22 04/17/2019   Lab Results  Component Value Date   TSH 1.82 04/08/2016    ASSESSMENT/PLAN    Problem List Items Addressed This Visit    CAD (coronary artery disease), with 60-70% stenosis in RCA - Primary (Chronic)    Nonocclusive moderate single-vessel CAD noted cath. Targeting LDL less than 70 and aggressive risk factor modification of hypertension and glycemic control.  On aspirin (not listed)along with beta-blocker, calcium blocker and ARB.      Relevant Orders   EKG 12-Lead (Completed)   Hepatic function panel   Lipid panel   Essential hypertension (Chronic)    Borderline elevated pressures today.  He is on high-dose amlodipine and valsartan.  Also moderate dose atenolol.  Certainly would not want to further titrate atenolol.  We could consider diuretic if pressures go up.      Dyslipidemia, goal LDL below 70 (Chronic)    We will start her back on pravastatin based on recent labs however he ended up not taking this either because of musculoskeletal symptoms.  Plan will be to continue Pravachol for now, but check labs in June and referred to [facility] lipid clinic to determine potential additional options.      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  He is asymptomatic with no exercise fatigue that would be explained by bradycardia.  Walking fine. Continue to monitor--we may eventually have to cut down on his  atenolol dose, but would prefer to avoid changes for now..      Relevant Orders   EKG 12-Lead (Completed)   RBBB (Chronic)  Stable.  No issues         COVID-19 Education: The signs and symptoms of COVID-19 were discussed with the patient and how to seek care for testing (follow up with PCP or arrange E-visit).   The importance of social distancing was discussed today.  I spent a total of 18 minutes with the patient. >  50% of the time was spent in direct patient consultation.  Additional time spent with chart review  / charting (studies, outside notes, etc): 8 Total Time: 26 min   Current medicines are reviewed at length with the patient today.  (+/- concerns) none  Notice: This dictation was prepared with Dragon dictation along with smaller phrase technology. Any transcriptional errors that result from this process are unintentional and may not be corrected upon review.  Patient Instructions / Medication Changes & Studies & Tests Ordered   Patient Instructions  Medication Instructions:  No changes  *If you need a refill on your cardiac medications before your next appointment, please call your pharmacy*   Lab Work: Need labs HEPATIC PANEL, LIPID BOTH IN June 2021  - fasting   If you have labs (blood work) drawn today and your tests are completely normal, you will receive your results only by: Marland Kitchen MyChart Message (if you have MyChart) OR . A paper copy in the mail If you have any lab test that is abnormal or we need to change your treatment, we will call you to review the results.   Testing/Procedures: Not needed   Follow-Up: At [facility], you and your health needs are our priority.  As part of our continuing mission to provide you with exceptional heart care, we have created designated Provider Care Teams.  These Care Teams include your primary Cardiologist (physician) and Advanced Practice Providers (APPs -  Physician Assistants and Nurse Practitioners) who all  work together to provide you with the care you need, when you need it.     Your next appointment:   12 month(s)  The format for your next appointment:   In Person  Provider:   Bryan Lemma, MD   Other Instructions     Studies Ordered:   Orders Placed This Encounter  Procedures  . Hepatic function panel  . Lipid panel  . EKG 12-Lead     Bryan Lemma, M.D., M.S. Interventional Cardiologist   Pager # [phone] Phone # [phone] [address] [place], [place] [address]   Thank you for choosing Heart[facility] at [facility]!!

## 2019-05-16 NOTE — Patient Instructions (Signed)
Medication Instructions:  No changes  *If you need a refill on your cardiac medications before your next appointment, please call your pharmacy*   Lab Work: Need labs HEPATIC PANEL, LIPID BOTH IN June 2021  - fasting   If you have labs (blood work) drawn today and your tests are completely normal, you will receive your results only by: Marland Kitchen MyChart Message (if you have MyChart) OR . A paper copy in the mail If you have any lab test that is abnormal or we need to change your treatment, we will call you to review the results.   Testing/Procedures: Not needed   Follow-Up: At [facility], you and your health needs are our priority.  As part of our continuing mission to provide you with exceptional heart care, we have created designated Provider Care Teams.  These Care Teams include your primary Cardiologist (physician) and Advanced Practice Providers (APPs -  Physician Assistants and Nurse Practitioners) who all work together to provide you with the care you need, when you need it.     Your next appointment:   12 month(s)  The format for your next appointment:   In Person  Provider:   Bryan Lemma, MD   Other Instructions

## 2019-05-17 ENCOUNTER — Other Ambulatory Visit: Payer: Self-pay | Admitting: Family Medicine

## 2019-05-20 ENCOUNTER — Encounter: Payer: Self-pay | Admitting: Cardiology

## 2019-05-20 NOTE — Assessment & Plan Note (Addendum)
Nonocclusive moderate single-vessel CAD noted cath. Targeting LDL less than 70 and aggressive risk factor modification of hypertension and glycemic control.  On aspirin (not listed)along with beta-blocker, calcium blocker and ARB.

## 2019-05-20 NOTE — Assessment & Plan Note (Signed)
Stable. No issues.

## 2019-05-20 NOTE — Assessment & Plan Note (Addendum)
On stable dose of atenolol, with normal resting heart rate.  He is asymptomatic with no exercise fatigue that would be explained by bradycardia.  Walking fine. Continue to monitor--we may eventually have to cut down on his atenolol dose, but would prefer to avoid changes for now.Marland Kitchen

## 2019-05-20 NOTE — Assessment & Plan Note (Signed)
We will start her back on pravastatin based on recent labs however he ended up not taking this either because of musculoskeletal symptoms.  Plan will be to continue Pravachol for now, but check labs in June and referred to [facility] lipid clinic to determine potential additional options.

## 2019-05-20 NOTE — Assessment & Plan Note (Signed)
Borderline elevated pressures today.  He is on high-dose amlodipine and valsartan.  Also moderate dose atenolol.  Certainly would not want to further titrate atenolol.  We could consider diuretic if pressures go up.

## 2019-05-23 ENCOUNTER — Other Ambulatory Visit: Payer: Medicare Other

## 2019-05-31 ENCOUNTER — Other Ambulatory Visit: Payer: Self-pay | Admitting: Family Medicine

## 2019-05-31 MED ORDER — PRAVASTATIN SODIUM 40 MG PO TABS: 40.0000 mg | ORAL_TABLET | Freq: Every day | ORAL | 0 refills | Status: DC

## 2019-06-08 ENCOUNTER — Telehealth: Payer: Self-pay | Admitting: Family Medicine

## 2019-06-08 NOTE — Chronic Care Management (AMB) (Signed)
°  Chronic Care Management   Note  06/08/2019 Name: James Jones MRN: [id number] DOB: 1945/11/16  James Jones is a 74 y.o. year old male who is a primary care patient of Donita Brooks, MD. I reached out to [name] by phone today in response to a referral sent by [name] PCP, Donita Brooks, MD.   James Jones was given information about Chronic Care Management services today including:  1. CCM service includes personalized support from designated clinical staff supervised by his physician, including individualized plan of care and coordination with other care providers 2. 24/7 contact phone numbers for assistance for urgent and routine care needs. 3. Service will only be billed when office clinical staff spend 20 minutes or more in a month to coordinate care. 4. Only one practitioner may furnish and bill the service in a calendar month. 5. The patient may stop CCM services at any time (effective at the end of the month) by phone call to the office staff.   Patient agreed to services and verbal consent obtained.   Follow up plan:   Myra Cody  Upstream Scheduler

## 2019-06-15 ENCOUNTER — Other Ambulatory Visit: Payer: Self-pay

## 2019-06-18 ENCOUNTER — Other Ambulatory Visit: Payer: Self-pay | Admitting: Family Medicine

## 2019-06-18 DIAGNOSIS — E119 Type 2 diabetes mellitus without complications: Secondary | ICD-10-CM

## 2019-06-18 DIAGNOSIS — I10 Essential (primary) hypertension: Secondary | ICD-10-CM

## 2019-06-18 DIAGNOSIS — I2510 Atherosclerotic heart disease of native coronary artery without angina pectoris: Secondary | ICD-10-CM

## 2019-06-18 DIAGNOSIS — E1122 Type 2 diabetes mellitus with diabetic chronic kidney disease: Secondary | ICD-10-CM

## 2019-06-19 ENCOUNTER — Ambulatory Visit (INDEPENDENT_AMBULATORY_CARE_PROVIDER_SITE_OTHER): Payer: Medicare Other | Admitting: Internal Medicine

## 2019-06-19 ENCOUNTER — Encounter: Payer: Self-pay | Admitting: Internal Medicine

## 2019-06-19 ENCOUNTER — Other Ambulatory Visit: Payer: Self-pay

## 2019-06-19 VITALS — BP 130/62 | HR 60 | Ht 69.0 in | Wt 218.0 lb

## 2019-06-19 DIAGNOSIS — E669 Obesity, unspecified: Secondary | ICD-10-CM

## 2019-06-19 DIAGNOSIS — Z6830 Body mass index (BMI) 30.0-30.9, adult: Secondary | ICD-10-CM

## 2019-06-19 DIAGNOSIS — I2510 Atherosclerotic heart disease of native coronary artery without angina pectoris: Secondary | ICD-10-CM

## 2019-06-19 DIAGNOSIS — E1159 Type 2 diabetes mellitus with other circulatory complications: Secondary | ICD-10-CM | POA: Diagnosis not present

## 2019-06-19 DIAGNOSIS — E785 Hyperlipidemia, unspecified: Secondary | ICD-10-CM

## 2019-06-19 LAB — POCT GLYCOSYLATED HEMOGLOBIN (HGB A1C): Hemoglobin A1C: 6.3 % — AB (ref 4.0–5.6)

## 2019-06-19 NOTE — Addendum Note (Signed)
Addended by: Darliss Ridgel I on: 06/19/2019 10:32 AM   Modules accepted: Orders

## 2019-06-19 NOTE — Progress Notes (Signed)
Patient ID: James Jones, male   DOB: 1945/10/09, 74 y.o.   MRN: [id number]  This visit occurred during the SARS-CoV-2 public health emergency.  Safety protocols were in place, including screening questions prior to the visit, additional usage of staff PPE, and extensive cleaning of exam room while observing appropriate contact time as indicated for disinfecting solutions.   HPI: James Jones is a 74 y.o.-year-old male, returning for f/u for DM2, dx in ~2005, non-insulin-dependent, lately more controlled, with complications (CAD, CKD). Last visit 4 months ago.Marland Kitchen PCP: Dr. Jenna Luo  Reviewed HbA1c levels: Lab Results  Component Value Date   HGBA1C 6.6 (A) 02/13/2019   HGBA1C 6.3 (H) 12/08/2018   HGBA1C 6.5 (H) 08/02/2018  04/11/2014: HbA1c 8.4% 01/08/2014: HbA1c 7.7% He had steroid inj for gout in his elbows - 12/2013. He also had a steroid inj in knee fall 2016, too.   He is on: - Glipizide ER 2.5 mg daily in a.m. We had to stop Januvia since last visit due to price.  Tradjenta was also not covered. Previously on low-dose Metformin ER, but stopped 07/2018 due to CKD He was previously on glipizide ER 2.5 mg daily in 07/2015. Had nausea, vomiting, loss of appetite with regular metformin.  He was on Actos. He was on insulin before (Lantus) - came off years ago.  Pt checks his sugars 1-3 times a day: - am: 87-116, 154 >> 101-113 >> 86-130 >> 79, 92-114 >> 104-133, 144 - 2h after b'fast: 68, 71-156, 161 >> 107 >> n/c >> 119, 160 >> n/c - before lunch: 96-110 >> 75, 99 >> 113-145 >> 87 >> 86, 105 >> 119-142 - 2h after lunch: 107, 118 >> n/c >> 155, 285 (Prednisone) >> n/c - before dinner:  74-114 >> 110-148 (snack) >> 87 >> n/c - 2h after dinner:110-171 >> 102 >> n/c >> 103 >> 89, 148 >> 79, 118 - bedtime: 123, 146 >> 89-135 >> n/c - nighttime: 89 >> 112 >> n/c >> 100 >> n/c  >> 122 Lowest 54 x1 >>... 79 >> 79; he has hypoglycemia awareness in the 70s. Highest sugar was 285  (Prednisone) >> 148 >> 144.  Glucometer: Freestyle [employer] did not cover Freestyle Libre CGM.  Pt's meals are: - Breakfast: bacon + eggs, sausage, grits, oatmeal, cereal, toast wheat - Lunch: BLT, soups, fruit, nuts - Dinner: chicken, pork + greens, rice  - Snacks: 1 a day: pretzels; carrots  Previously exercising by walking several times a week, but now not exercising due to the coronavirus pandemic.  -+ CKD-sees nephrology (renal ultrasound showed renal cysts), latest BUN/creatinine:  Lab Results  Component Value Date   BUN 18 04/17/2019   CREATININE 1.71 (H) 04/17/2019  05/24/2014: 34/1.64 01/08/2014: 18/1.15 On losartan >> valsartan.. -+ HL; last set of lipids: Lab Results  Component Value Date   CHOL 223 (H) 04/17/2019   HDL 52 04/17/2019   LDLCALC 141 (H) 04/17/2019   TRIG 162 (H) 04/17/2019   CHOLHDL 4.3 04/17/2019  On rosuvastatin >> pravastatin 40, fish oil. - last eye exam was in 05/2019: No DR.  He has history of cataract surgery in 2016 and 17. - no numbness and tingling in his feet.  Latest TSH was normal: Lab Results  Component Value Date   TSH 1.82 04/08/2016   ROS: Constitutional: + weight gain/no weight loss, no fatigue, no subjective hyperthermia, no subjective hypothermia Eyes: no blurry vision, no xerophthalmia ENT: no sore throat, no nodules palpated in neck, no dysphagia, no  odynophagia, no hoarseness Cardiovascular: no CP/no SOB/no palpitations/no leg swelling Respiratory: no cough/no SOB/no wheezing Gastrointestinal: no N/no V/no D/no C/no acid reflux Musculoskeletal: + muscle aches/no joint aches Skin: no rashes, no hair loss Neurological: no tremors/no numbness/no tingling/no dizziness  I reviewed pt's medications, allergies, PMH, social hx, family hx, and changes were documented in the history of present illness. Otherwise, unchanged from my initial visit note.  Past Medical History:  Diagnosis Date  . CKD (chronic kidney disease) stage  2, GFR 60-89 ml/min 03/17/2012  . Coronary artery disease, non-occlusive 03/18/2012   60-70% stenosis in RCA -- FFR 0.84; EF 60-65%  . Diabetes mellitus without complication (HCC)   . GERD (gastroesophageal reflux disease)    On PPI  . History of GI bleed January 2015   No obvious findings on EGD/colonoscopy  . Hydrocele, bilateral    large on [place] 2020  . Hyperlipidemia   . Hypertension   . OSA (obstructive sleep apnea) 03/18/2012   Doing better with CPAP  . Pulmonary hypertension (HCC) 12/10/2010   ECHO:  Mild PH,mild LVH; PA pressures estimated 30-40 mmHg  . 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   Event monitor; usually during sleeping hours; therefore not on beta blocker  + Gout  Past Surgical History:  Procedure Laterality Date  . APPENDECTOMY  1974  . CARPAL TUNNEL RELEASE    . EYE SURGERY  1960   Left eye  . HEMORRHOID SURGERY    . LEFT HEART CATH AND CORONARY ANGIOGRAPHY  03/27/2010   Moderate mid RCA lesion,right radial approach,normal EF  . LEFT HEART CATHETERIZATION WITH CORONARY ANGIOGRAM N/A 03/17/2012   Procedure: LEFT HEART CATHETERIZATION WITH CORONARY ANGIOGRAM;  Surgeon: Marykay Lex, MD;  Location: [facility] CATH LAB;  Service: Cardiovascular: Fractional Flow Reserve Measurement of mid RCA 60-70% stenosis = 0.84. Otherwise mild LCA CAD.  Normal EF & EDP  . SHOULDER ARTHROSCOPY  2006  . TRANSTHORACIC ECHOCARDIOGRAM  12/2010    Mild concentric LVH.  EF> 55%.  GR 1 DD.  Mild aortic sclerosis no stenosis.  PA pressures estimated 30-40 mmHg   History   Social History  . Marital Status: Married    Spouse Name: N/A   Occupational History  . retired   Social History Main Topics  . Smoking status: Former Smoker -- 2.00 packs/day    Quit date: 04/12/2012  . Smokeless tobacco: Never Used  . Alcohol Use: 0.5 oz/week    1 drink(s) per week  . Drug Use: Yes    Special: Marijuana     Comment: cannibus   Social History Narrative   Married, father of 2,  grandfather of 3.   He is a former smoker of about a pack to pack and half cigarettes a day -- he quit in March of this year.    He is an avid exerciser working at least 4-5 days a week doing her walking or stationary bike.   Current Outpatient Medications on File Prior to Visit  Medication Sig Dispense Refill  . allopurinol (ZYLOPRIM) 100 MG tablet Take 1 tablet (100 mg total) by mouth daily. 90 tablet 2  . amLODipine (NORVASC) 10 MG tablet Take 1 tablet (10 mg total) by mouth daily. 90 tablet 3  . atenolol (TENORMIN) 50 MG tablet Take 1 tablet (50 mg total) by mouth daily. 90 tablet 3  . Blood Glucose Monitoring Suppl (FREESTYLE LITE) DEVI Use to check blood sugar once daily 1 each 0  . Cholecalciferol (VITAMIN D-3)  1000 UNITS CAPS Take by mouth daily. 2 capsules.    . ferrous sulfate 325 (65 FE) MG tablet Take 1 tablet (325 mg total) by mouth 2 (two) times daily with a meal.  3  . fish oil-omega-3 fatty acids 1000 MG capsule Take 1 g by mouth daily.    Marland Kitchen glipiZIDE (GLUCOTROL XL) 2.5 MG 24 hr tablet Take 1 tablet (2.5 mg total) by mouth daily with breakfast. 90 tablet 2  . glucose blood (FREESTYLE LITE) test strip USE TO CHECK BLOOD SUGAR ONCE DAILY. E11.59 100 strip 12  . Multiple Vitamin (MULTIVITAMIN WITH MINERALS) TABS Take 1 tablet by mouth daily.    . Potassium 99 MG TABS Take 99 mg by mouth daily.     . pravastatin (PRAVACHOL) 40 MG tablet Take 1 tablet (40 mg total) by mouth daily. 90 tablet 0  . PRESCRIPTION MEDICATION Uses C-PAP at bedtime    . valsartan (DIOVAN) 320 MG tablet Take 1 tablet (320 mg total) by mouth daily. 90 tablet 3   No current facility-administered medications on file prior to visit.   No Known Allergies Family History  Problem Relation Age of Onset  . Kidney failure Mother   . Coronary artery disease Father   . Kidney failure Sister    PE: BP 130/62   Pulse 60   Ht 5\' 9"  (1.753 m)   Wt 218 lb (98.9 kg)   SpO2 95%   BMI 32.19 kg/m  There is no  height or weight on file to calculate BMI. Wt Readings from Last 3 Encounters:  06/19/19 218 lb (98.9 kg)  05/16/19 220 lb 6.4 oz (100 kg)  04/17/19 219 lb (99.3 kg)   Constitutional: overweight, in NAD Eyes: PERRLA, EOMI, no exophthalmos ENT: moist mucous membranes, no thyromegaly, no cervical lymphadenopathy Cardiovascular: RRR, No MRG Respiratory: CTA B Gastrointestinal: abdomen soft, NT, ND, BS+ Musculoskeletal: no deformities, strength intact in all 4 Skin: moist, warm, no rashes Neurological: no tremor with outstretched hands, DTR normal in all 4  ASSESSMENT: 1. DM2, non-insulin-dependent, uncontrolled, with complications - CAD - cardiologist Dr. 06/17/19 - CKD - was seeing Dr Herbie Baltimore >> will change  2. HL  3.  Obesity class I  PLAN:  1. Patient with longstanding, previously uncontrolled type 2 diabetes, with much improved control in the last 3 years.  He was on a DPP 4 inhibitor, however, since last visit he had to come off due to price.  He is back on sulfonylurea low-dose.  Previously on Metformin ER but he had to stop in 07/2018 due to CKD.  At last visit, HbA1c was higher, at 6.6%, but still at goal.  The sugars remained at goal.  He was not checking sugars quite every day but the sugars he checked were well into the normal range.  He was drinking juice/sweet tea at that time and I strongly advised him to stop.  He did stop these since last visit. -At this visit, the majority of his sugars are at goal with only occasional higher CBGs up to 144 before meals.  We do not need to change his regimen for now, especially since he cannot afford higher tier medications. - I suggested to:  Patient Instructions  Continue glipizide XL 2.5 mg mg before breakfast.  Please return in 6 months with your sugar log.   - we checked his HbA1c: 6.3% (lower) - advised to check sugars at different times of the day - 1x a day, rotating check times -  advised for yearly eye exams >> he is UTD -  return to clinic in 6 months   2. HL -Reviewed latest lipid panel from 04/2019: LDL above target, as are his triglycerides: Lab Results  Component Value Date   CHOL 223 (H) 04/17/2019   HDL 52 04/17/2019   LDLCALC 141 (H) 04/17/2019   TRIG 162 (H) 04/17/2019   CHOLHDL 4.3 04/17/2019  -Continues atorvastatin but he continues to have myalgias.  He was previously on rosuvastatin and changed to atorvastatin to hopefully decrease muscle aches.  He has an appointment later this week with PCP and he plans to discuss with him about going back to rosuvastatin.  3. Obesity class I -He gained weight during the coronavirus pandemic since he could not go to the gym; he gained 13 more pounds since last visit (207 >> 218 lbs) -Unfortunately, since last visit, we had to switch from a DPP 4 inhibitor, which was weight neutral, to sulfonylurea, which is weight- inducing, due to price  Carlus Pavlov, MD PhD [facility] Endocrinology

## 2019-06-19 NOTE — Patient Instructions (Signed)
Continue glipizide XL 2.5 mg mg before breakfast.  Please return in 6 months with your sugar log.

## 2019-06-20 NOTE — Chronic Care Management (AMB) (Addendum)
Chronic Care Management Pharmacy  Name: James Jones  MRN: [id number] DOB: September 28, 1945  [name],  74 y.o. , male presents for their Initial CCM visit with the clinical pharmacist In office.  PCP : Donita Brooks, MD  Their chronic conditions include: hypertension, CAD, Diabetes, dyslipidemia.  Office Visits:  04/17/2019 ([facility]) - statin was d/c previously d/t myalgias, but was restarted d/t LDL > 200.  LDL was still high after initiation of statin so fish oil was increased to 3000 mg daily.   03/08/2019 ([facility]) - gout in right knee, started prednisone taper pack  02/19/2019 ([facility]) - added atenolol 50mg  daily due to elevated BP,   Consult Visit:  06/19/2019 08/19/2019, Endocrinology) - continued all diabetic meds, may want to discuss switch back to rosuvastatin  Medications: Outpatient Encounter Medications as of 06/21/2019  Medication Sig   allopurinol (ZYLOPRIM) 100 MG tablet Take 1 tablet (100 mg total) by mouth daily.   amLODipine (NORVASC) 10 MG tablet Take 1 tablet (10 mg total) by mouth daily.   atenolol (TENORMIN) 50 MG tablet Take 1 tablet (50 mg total) by mouth daily.   Blood Glucose Monitoring Suppl (FREESTYLE LITE) DEVI Use to check blood sugar once daily   Cholecalciferol (VITAMIN D-3) 1000 UNITS CAPS Take by mouth daily. 2 capsules.   ferrous sulfate 325 (65 FE) MG tablet Take 1 tablet (325 mg total) by mouth 2 (two) times daily with a meal.   fish oil-omega-3 fatty acids 1000 MG capsule Take 1,200 mg by mouth daily.    glipiZIDE (GLUCOTROL XL) 2.5 MG 24 hr tablet Take 1 tablet (2.5 mg total) by mouth daily with breakfast.   glucose blood (FREESTYLE LITE) test strip USE TO CHECK BLOOD SUGAR ONCE DAILY. E11.59   Multiple Vitamin (MULTIVITAMIN WITH MINERALS) TABS Take 1 tablet by mouth daily.   Potassium 99 MG TABS Take 99 mg by mouth daily.    pravastatin (PRAVACHOL) 40 MG tablet Take 1 tablet (40 mg total) by mouth daily.   PRESCRIPTION MEDICATION Uses  C-PAP at bedtime   valsartan (DIOVAN) 320 MG tablet Take 1 tablet (320 mg total) by mouth daily.   No facility-administered encounter medications on file as of 06/21/2019.     Current Diagnosis/Assessment:    06/23/2019: Low Risk    Difficulty of Paying Living Expenses: Not very hard     Goals Addressed             This Visit's Progress    CARE PLAN       CARE PLAN ENTRY  Current Barriers:  Chronic Disease Management support, education, and care coordination needs related to Hypertension, Hyperlipidemia, and Diabetes   Hypertension Pharmacist Clinical Goal(s): Over the next 180 days, patient will work with PharmD and providers to maintain BP goal <130/80 Current regimen:  Amlodipine 10mg  daily, atenolol 50mg  daily, valsartan 320mg  daily Interventions: Continue current therapy Monitor BP daily Patient self care activities - Over the next 180 days, patient will: Check BP once daily, document, and provide at future appointments Ensure daily salt intake < 2300 mg/day Report consistent BP readings > 130/80 to PharmD or PCP.  Hyperlipidemia Pharmacist Clinical Goal(s): Over the next 180 days, patient will work with PharmD and providers to achieve LDL goal < 70. Current regimen:  Pravastatin 40mg  daily Interventions: Continue current therapy RTC for lipid panel in June Patient self care activities - Over the next 180 days, patient will: Make appointment to have lipids rechecked to assess current medication  effectiveness Notify PharmD or PCP of any worsening muscle pain  Diabetes Pharmacist Clinical Goal(s): Over the next 180 days, patient will work with PharmD and providers to maintain A1c goal <7% Current regimen:  Glipizide XL 2.5mg  daily Interventions: Continue current therapy Counseled on diet and importance of eating around all doses of medications that lower blood sugar. Patient self care activities - Over the next 180 days, patient  will: Check blood sugar in the morning before eating or drinking, document, and provide at future appointments Contact provider with any episodes of hypoglycemia, or severely elevated blood sugars.  Initial goal documentation         Diabetes   Recent Relevant Labs: Lab Results  Component Value Date/Time   HGBA1C 6.3 (A) 06/19/2019 10:31 AM   HGBA1C 6.6 (A) 02/13/2019 12:01 PM   HGBA1C 6.3 (H) 12/08/2018 11:23 AM   HGBA1C 6.5 (H) 08/02/2018 08:52 AM   MICROALBUR 176.3 12/08/2018 11:23 AM   MICROALBUR 21.2 12/10/2016 11:43 AM    Kidney Function Lab Results  Component Value Date/Time   CREATININE 1.71 (H) 04/17/2019 08:55 AM   CREATININE 1.64 (H) 02/19/2019 09:29 AM   GFRNONAA 38 (L) 04/17/2019 08:55 AM   GFRAA 44 (L) 04/17/2019 08:55 AM     Checking BG:  three to four times per week  in the morning.  Recent FBG Readings: 90-120s when checking  Patient has failed these meds in past: Januvia (cost), Tradjenta (cost), metformin (kidney fxn) Patient is currently controlled on the following medications: glipizide XL 2.5mg  daily  Last diabetic Foot exam:  Lab Results  Component Value Date/Time   HMDIABEYEEXA No Retinopathy 10/24/2018 12:00 AM      We discussed: diet and exercise extensively and how to recognize and treat signs of hypoglycemia.  Discussed the importance of eating around all doses of diabetic meds.  He denies any hypoglycemic episodes as of late and just saw endocrinology with good report.  Plan  Continue current medications Hypertension    Office blood pressures are  BP Readings from Last 3 Encounters:  06/19/19 130/62  05/16/19 133/67  04/17/19 130/64    Patient has failed these meds in the past: none noted  Patient checks BP at home daily  Patient home BP readings are ranging: recent readings are 130s/60s  Patient is currently controlled on the following medications: amlodipine 10mg  daily, atenolol 50 mg daily, valsartan 320 mg  daily  Denies swelling, dizziness with medications.  Checks BP with manual monitor and on wife's digital monitor.  Recommended  Plan  Continue current medications.  Continue to monitor BP daily and report > 130/80 to PharmD or PCP.     Hyperlipidemia   Lipid Panel     Component Value Date/Time   CHOL 223 (H) 04/17/2019 0855   TRIG 162 (H) 04/17/2019 0855   HDL 52 04/17/2019 0855   CHOLHDL 4.3 04/17/2019 0855   VLDL 19 04/08/2016 0828   LDLCALC 141 (H) 04/17/2019 0855     The 10-year ASCVD risk score Mikey Bussing DC Jr., et al., 2013) is: 39.4%   Values used to calculate the score:     Age: 73 years     Sex: Male     Is Non-Hispanic African American: Yes     Diabetic: Yes     Tobacco smoker: No     Systolic Blood Pressure: 270 mmHg     Is BP treated: Yes     HDL Cholesterol: 52 mg/dL     Total Cholesterol: 223  mg/dL   Patient has failed these meds in past: rosuvastatin 40mg  Patient is currently uncontrolled on the following medications: pravastatin 40mg   Patient has increased fish oil to 1200mg  bid based on last LDL result in March when LDL was elevated.  Patient still having slight muscle pain but attributed this to arthritis/age.  Mentioned wanting to switch back to Crestor he felt this was controlling cholesterol better.  Will need lipids re-checked before making this decision.    Plan  Continue current medications.  RTC in June for lipid recheck.  If still elevated switch patient to rosuvastatin 10mg  daily pending approval from Dr. .   Vaccines   Reviewed and discussed patient's vaccination history.    Immunization History  Administered Date(s) Administered   Influenza, High Dose Seasonal PF 11/24/2017   Influenza,inj,Quad PF,6+ Mos 10/22/2014, 10/29/2015   PFIZER SARS-COV-2 Vaccination 02/23/2019, 03/16/2019   Pneumococcal Conjugate-13 04/13/2016   Pneumococcal Polysaccharide-23 02/09/2008, 04/14/2018   Tdap 02/09/2008    Plan  Recommended patient  receive Shingles vaccine in pharmacy.  Not interested at this time but will consider in the future.   Medication Management   Miscellaneous medications: allopurinol 100mg   OTC's: Vitamin D-3 2000 IU daily, ferrous sulfate 325mg  daily, MVI, Fish oil 1200mg  bid, potassium 99mg  [facility].  Patient has much cheaper copays using mail order than he did using [facility]. Patient reports using pill box method to organize medications and promote adherence. Patient denies missed doses of medication.   06/13/2016, PharmD Clinical Pharmacist 04/08/2008 Family Medicine [phone]  I have collaborated with the care management provider regarding care management and care coordination activities outlined in this encounter and have reviewed this encounter including documentation in the note and care plan. I am certifying that I agree with the content of this note and encounter as supervising physician.

## 2019-06-21 ENCOUNTER — Other Ambulatory Visit: Payer: Self-pay

## 2019-06-21 ENCOUNTER — Ambulatory Visit: Payer: Medicare Other | Admitting: Pharmacist

## 2019-06-21 DIAGNOSIS — E1159 Type 2 diabetes mellitus with other circulatory complications: Secondary | ICD-10-CM

## 2019-06-21 DIAGNOSIS — I10 Essential (primary) hypertension: Secondary | ICD-10-CM

## 2019-06-21 DIAGNOSIS — E785 Hyperlipidemia, unspecified: Secondary | ICD-10-CM

## 2019-06-21 NOTE — Patient Instructions (Addendum)
Thank you for meeting with me today!  I look forward to working with you to help you meet all of your healthcare goals and answer any questions you may have.  Feel free to contact me anytime!   Visit Information  Goals Addressed            This Visit's Progress   . CARE PLAN       CARE PLAN ENTRY  Current Barriers:  . Chronic Disease Management support, education, and care coordination needs related to Hypertension, Hyperlipidemia, and Diabetes   Hypertension . Pharmacist Clinical Goal(s): o Over the next 180 days, patient will work with PharmD and providers to maintain BP goal <130/80 . Current regimen:  o Amlodipine 10mg  daily, atenolol 50mg  daily, valsartan 320mg  daily . Interventions: o Continue current therapy o Monitor BP daily . Patient self care activities - Over the next 180 days, patient will: o Check BP once daily, document, and provide at future appointments o Ensure daily salt intake < 2300 mg/day o Report consistent BP readings > 130/80 to PharmD or PCP.  Hyperlipidemia . Pharmacist Clinical Goal(s): o Over the next 180 days, patient will work with PharmD and providers to achieve LDL goal < 70. . Current regimen:  o Pravastatin 40mg  daily . Interventions: o Continue current therapy o RTC for lipid panel in June . Patient self care activities - Over the next 180 days, patient will: o Make appointment to have lipids rechecked to assess current medication effectiveness o Notify PharmD or PCP of any worsening muscle pain  Diabetes . Pharmacist Clinical Goal(s): o Over the next 180 days, patient will work with PharmD and providers to maintain A1c goal <7% . Current regimen:  o Glipizide XL 2.5mg  daily . Interventions: o Continue current therapy o Counseled on diet and importance of eating around all doses of medications that lower blood sugar. . Patient self care activities - Over the next 180 days, patient will: o Check blood sugar in the morning before  eating or drinking, document, and provide at future appointments o Contact provider with any episodes of hypoglycemia, or severely elevated blood sugars.  Initial goal documentation        James Jones was given information about Chronic Care Management services today including:  1. CCM service includes personalized support from designated clinical staff supervised by his physician, including individualized plan of care and coordination with other care providers 2. 24/7 contact phone numbers for assistance for urgent and routine care needs. 3. Standard insurance, coinsurance, copays and deductibles apply for chronic care management only during months in which we provide at least 20 minutes of these services. Most insurances cover these services at 100%, however patients may be responsible for any copay, coinsurance and/or deductible if applicable. This service may help you avoid the need for more expensive face-to-face services. 4. Only one practitioner may furnish and bill the service in a calendar month. 5. The patient may stop CCM services at any time (effective at the end of the month) by phone call to the office staff.  Patient agreed to services and verbal consent obtained.   The patient verbalized understanding of instructions provided today and agreed to receive a mailed copy of patient instruction and/or educational materials. Telephone follow up appointment with pharmacy team member scheduled for:  12/31/2019 @ 12:30 PM Beverly Milch, PharmD Clinical Pharmacist [facility] [phone]   High Cholesterol  High cholesterol is a condition in which the blood has high levels  of a white, waxy, fat-like substance (cholesterol). The human body needs small amounts of cholesterol. The liver makes all the cholesterol that the body needs. Extra (excess) cholesterol comes from the food that we eat. Cholesterol is carried from the liver by the blood through the blood  vessels. If you have high cholesterol, deposits (plaques) may build up on the walls of your blood vessels (arteries). Plaques make the arteries narrower and stiffer. Cholesterol plaques increase your risk for heart attack and stroke. Work with your health care provider to keep your cholesterol levels in a healthy range. What increases the risk? This condition is more likely to develop in people who:  Eat foods that are high in animal fat (saturated fat) or cholesterol.  Are overweight.  Are not getting enough exercise.  Have a family history of high cholesterol. What are the signs or symptoms? There are no symptoms of this condition. How is this diagnosed? This condition may be diagnosed from the results of a blood test.  If you are older than age 61, your health care provider may check your cholesterol every 4-6 years.  You may be checked more often if you already have high cholesterol or other risk factors for heart disease. The blood test for cholesterol measures:  "Bad" cholesterol (LDL cholesterol). This is the main type of cholesterol that causes heart disease. The desired level for LDL is less than 100.  "Good" cholesterol (HDL cholesterol). This type helps to protect against heart disease by cleaning the arteries and carrying the LDL away. The desired level for HDL is 60 or higher.  Triglycerides. These are fats that the body can store or burn for energy. The desired number for triglycerides is lower than 150.  Total cholesterol. This is a measure of the total amount of cholesterol in your blood, including LDL cholesterol, HDL cholesterol, and triglycerides. A healthy number is less than 200. How is this treated? This condition is treated with diet changes, lifestyle changes, and medicines. Diet changes  This may include eating more whole grains, fruits, vegetables, nuts, and fish.  This may also include cutting back on red meat and foods that have a lot of added  sugar. Lifestyle changes  Changes may include getting at least 40 minutes of aerobic exercise 3 times a week. Aerobic exercises include walking, biking, and swimming. Aerobic exercise along with a healthy diet can help you maintain a healthy weight.  Changes may also include quitting smoking. Medicines  Medicines are usually given if diet and lifestyle changes have failed to reduce your cholesterol to healthy levels.  Your health care provider may prescribe a statin medicine. Statin medicines have been shown to reduce cholesterol, which can reduce the risk of heart disease. Follow these instructions at home: Eating and drinking If told by your health care provider:  Eat chicken (without skin), fish, veal, shellfish, ground [place] breast, and round or loin cuts of red meat.  Do not eat fried foods or fatty meats, such as hot dogs and salami.  Eat plenty of fruits, such as apples.  Eat plenty of vegetables, such as broccoli, potatoes, and carrots.  Eat beans, peas, and lentils.  Eat grains such as barley, rice, couscous, and bulgur wheat.  Eat pasta without cream sauces.  Use skim or nonfat milk, and eat low-fat or nonfat yogurt and cheeses.  Do not eat or drink whole milk, cream, ice cream, egg yolks, or hard cheeses.  Do not eat stick margarine or tub margarines that contain  trans fats (also called partially hydrogenated oils).  Do not eat saturated tropical oils, such as coconut oil and palm oil.  Do not eat cakes, cookies, crackers, or other baked goods that contain trans fats.  General instructions  Exercise as directed by your health care provider. Increase your activity level with activities such as gardening, walking, and taking the stairs.  Take over-the-counter and prescription medicines only as told by your health care provider.  Do not use any products that contain nicotine or tobacco, such as cigarettes and e-cigarettes. If you need help quitting, ask your  health care provider.  Keep all follow-up visits as told by your health care provider. This is important. Contact a health care provider if:  You are struggling to maintain a healthy diet or weight.  You need help to start on an exercise program.  You need help to stop smoking. Get help right away if:  You have chest pain.  You have trouble breathing. This information is not intended to replace advice given to you by your health care provider. Make sure you discuss any questions you have with your health care provider. Document Revised: 01/28/2017 Document Reviewed: 07/26/2015 Elsevier Patient Education  2020 [employer].

## 2019-06-22 ENCOUNTER — Other Ambulatory Visit: Payer: Self-pay | Admitting: Family Medicine

## 2019-06-22 MED ORDER — FERROUS SULFATE 325 (65 FE) MG PO TABS: 325.0000 mg | ORAL_TABLET | Freq: Two times a day (BID) | ORAL | 3 refills | Status: AC

## 2019-06-22 MED ORDER — ALLOPURINOL 100 MG PO TABS: 100.0000 mg | ORAL_TABLET | Freq: Every day | ORAL | 2 refills | Status: DC

## 2019-06-22 MED ORDER — ATENOLOL 50 MG PO TABS: 50.0000 mg | ORAL_TABLET | Freq: Every day | ORAL | 3 refills | Status: DC

## 2019-06-22 MED ORDER — VALSARTAN 320 MG PO TABS: 320.0000 mg | ORAL_TABLET | Freq: Every day | ORAL | 3 refills | Status: DC

## 2019-06-22 MED ORDER — AMLODIPINE BESYLATE 10 MG PO TABS: 10.0000 mg | ORAL_TABLET | Freq: Every day | ORAL | 3 refills | Status: DC

## 2019-06-22 MED ORDER — GLIPIZIDE ER 2.5 MG PO TB24: 2.5000 mg | ORAL_TABLET | Freq: Every day | ORAL | 2 refills | Status: DC

## 2019-06-22 MED ORDER — PRAVASTATIN SODIUM 40 MG PO TABS: 40.0000 mg | ORAL_TABLET | Freq: Every day | ORAL | 0 refills | Status: DC

## 2019-07-03 ENCOUNTER — Encounter: Payer: Self-pay | Admitting: Cardiovascular Disease

## 2019-07-03 ENCOUNTER — Telehealth: Payer: Self-pay | Admitting: *Deleted

## 2019-07-03 ENCOUNTER — Other Ambulatory Visit: Payer: Self-pay

## 2019-07-03 ENCOUNTER — Ambulatory Visit (INDEPENDENT_AMBULATORY_CARE_PROVIDER_SITE_OTHER): Payer: Medicare Other | Admitting: Cardiovascular Disease

## 2019-07-03 VITALS — BP 150/74 | HR 60 | Temp 97.3°F | Ht 68.0 in | Wt 218.0 lb

## 2019-07-03 DIAGNOSIS — G4733 Obstructive sleep apnea (adult) (pediatric): Secondary | ICD-10-CM | POA: Diagnosis not present

## 2019-07-03 DIAGNOSIS — E785 Hyperlipidemia, unspecified: Secondary | ICD-10-CM

## 2019-07-03 DIAGNOSIS — Z8739 Personal history of other diseases of the musculoskeletal system and connective tissue: Secondary | ICD-10-CM

## 2019-07-03 DIAGNOSIS — I10 Essential (primary) hypertension: Secondary | ICD-10-CM

## 2019-07-03 DIAGNOSIS — R600 Localized edema: Secondary | ICD-10-CM

## 2019-07-03 MED ORDER — FUROSEMIDE 20 MG PO TABS
20.0000 mg | ORAL_TABLET | ORAL | 3 refills | Status: DC | PRN
Start: 2019-07-03 — End: 2020-10-16

## 2019-07-03 NOTE — Telephone Encounter (Signed)
-----   Message from June Leap June, RN sent at 07/03/2019  8:46 AM EDT ----- Regarding: cpap machine Pt needs a Resmed airsense 10 auto. Settings 15-20 DME is [facility]

## 2019-07-03 NOTE — Telephone Encounter (Signed)
Per dr Richardean Chimera ordered for [facility] 10 auto. Settings 15-20 and placed to [facility] via community message

## 2019-07-03 NOTE — Patient Instructions (Signed)
Medication Instructions:  BEGIN TAKING LASIX 20MG  AS NEEDED FOR LEG SWELLING *If you need a refill on your cardiac medications before your next appointment, please call your pharmacy*   Follow-Up: At [facility], you and your health needs are our priority.  As part of our continuing mission to provide you with exceptional heart care, we have created designated Provider Care Teams.  These Care Teams include your primary Cardiologist (physician) and Advanced Practice Providers (APPs -  Physician Assistants and Nurse Practitioners) who all work together to provide you with the care you need, when you need it.  We recommend signing up for the patient portal called "MyChart".  Sign up information is provided on this After Visit Summary.  MyChart is used to connect with patients for Virtual Visits (Telemedicine).  Patients are able to view lab/test results, encounter notes, upcoming appointments, etc.  Non-urgent messages can be sent to your provider as well.   To learn more about what you can do with MyChart, go to [facility].    Your next appointment:   3 month(s)  The format for your next appointment:   In Person  Provider:   [email or web address], MD

## 2019-07-03 NOTE — Progress Notes (Signed)
Patient ID: Urijah Arko, male   DOB: 10/15/1945, 74 y.o.   MRN: [id number]    Primary cardiology: Dr. Glenetta Hew. Primary: Jenna Luo, MD  HPI: Anan Dapolito, is a 74 y.o. male who presents to the office today for a sleep clinic evaluation.  I last saw him in August 2018.   Mr. Jhalil Silvera originally from [place] to help. While in [place] in the 1990's  he was diagnosed with sleep apnea. He was given a CPAP unit. However, he never used CPAP. He has a history of hypertension, hyperlipidemia, diabetes mellitus, GERD, and remote tobacco use. He smoked for approximately 45 years but quit smoking in March 2000.  In February 2014  he was referred for a split-night sleep study. This confirmed severe sleep apnea. At that time he has significant daytime sleepiness with an Epworth scale of 15. AHI was 69.3 per hour and REM sleep was increased at 71.5 events per hour. He dropped his O2 saturation to 81% with REM sleep and had loud snoring. He underwent a split-night evaluation. He also was prescribed an 18 cm fixed water pressure.  Since initiating CPAP therapy, he does feel he is sleeping better he typically goes to have possibly 9:30 at night and wakes up around 5 to 5:30 in the morning. A download  from 06/21/2012 through June 2014 revealed 77% of the days with usage. He admits that he did not use it when he went on vacation. His average usage on days used to 6 hours and 2 minutes. He had 70% of days greater than 4 hours. HI was excellent at 1.4 is 18 cm water pressure.  When I last saw him in 2017 he was doing well.  He was compliant.  I last saw him in September 13, 2016 at which time he was continuing to use CPAP regularly.   He typically goes to bed around 10 PM and wakes up between 5 and 6 AM.  I obtained a new download in the office today from June 16 through July 15.  This reveals usage stays at 87%, and uses greater than 4 hours at 77%.  He was averaging 6 hours 1 minute of sleep.   CPAP set pressure was 18 cm.  AHI was excellent at 1.4.  There was no significant leak.  He states he exercises 3 days per week, typically walks up to 4 miles.  He denied any chest pain or palpitations. An Epworth scale in the office today and this endorsed at 8 with moderate chance of dozing while watching TV, sitting inactive in a public place, lying down to rest in the afternoon when circumstances persist, and slight chance of dozing as a passenger in a car for an hour without a break and will sitting quietly after lunch without alcohol.  Over the past 3 years, Mr. Delaughter states he has continued to be exceptionally compliant with CPAP use.  He states his use is at least 95 to close to 100% of the nights.  He typically goes to bed between 10 and 11 PM and wakes up at 5:30 AM.  He is sleeping well.  His sleep is restorative.  He is unaware of breakthrough snoring.  However he still has nocturia at least 2-3 times per night.  He recently's saw Dr. Ellyn Hack for his primary cardiology care.  He is on loading 10 mg, atenolol 50 mg and valsartan 3 to 20 mg for hypertension.  Remotely he was on Crestor and  apparently is now on pravastatin for lipid management.  Of note, LDL cholesterol was elevated at 141 on April 17, 2019 with total cholesterol 223.  He is diabetic on glipizide.  He presents for evaluation.  A new Epworth Sleepiness Scale score was calculated in the office today and this endorsed at 5 the high chance of dozing while watching television and a moderate chance of dozing while sitting inactive in a public place arguing against residual daytime sleepiness.  Past Medical History:  Diagnosis Date  . 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 Coronary artery disease, non-occlusive 03/18/2012   60-70% stenosis in RCA -- FFR 0.84; EF 60-65%  . Diabetes mellitus without complication ([place])   . GERD (gastroesophageal reflux disease)    On PPI  . History of GI bleed January 2015     No obvious findings on EGD/colonoscopy  . Hydrocele, bilateral    large on [place] 2020  . Hyperlipidemia   . Hypertension   . OSA (obstructive sleep apnea) 03/18/2012   Doing better with CPAP  . Pulmonary hypertension ([place]) 12/10/2010   ECHO:  Mild PH,mild LVH; PA pressures estimated 30-40 mmHg  . 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   Event monitor; usually during sleeping hours; therefore not on beta blocker    Past Surgical History:  Procedure Laterality Date  . APPENDECTOMY  1974  . CARPAL TUNNEL RELEASE    . EYE SURGERY  1960   Left eye  . HEMORRHOID SURGERY    . LEFT HEART CATH AND CORONARY ANGIOGRAPHY  03/27/2010   Moderate mid RCA lesion,right radial approach,normal EF  . LEFT HEART CATHETERIZATION WITH CORONARY ANGIOGRAM N/A 03/17/2012   Procedure: LEFT HEART CATHETERIZATION WITH CORONARY ANGIOGRAM;  Surgeon: Leonie Man, MD;  Location: [facility] CATH LAB;  Service: Cardiovascular: Fractional Flow Reserve Measurement of mid RCA 60-70% stenosis = 0.84. Otherwise mild LCA CAD.  Normal EF & EDP  . SHOULDER ARTHROSCOPY  2006  . TRANSTHORACIC ECHOCARDIOGRAM  12/2010    Mild concentric LVH.  EF> 55%.  GR 1 DD.  Mild aortic sclerosis no stenosis.  PA pressures estimated 30-40 mmHg    No Known Allergies  Current Outpatient Medications  Medication Sig Dispense Refill  . allopurinol (ZYLOPRIM) 100 MG tablet Take 1 tablet (100 mg total) by mouth daily. 90 tablet 2  . amLODipine (NORVASC) 10 MG tablet Take 1 tablet (10 mg total) by mouth daily. 90 tablet 3  . atenolol (TENORMIN) 50 MG tablet Take 1 tablet (50 mg total) by mouth daily. 90 tablet 3  . Blood Glucose Monitoring Suppl (FREESTYLE LITE) DEVI Use to check blood sugar once daily 1 each 0  . Cholecalciferol (VITAMIN D-3) 1000 UNITS CAPS Take by mouth daily. 2 capsules.    . ferrous sulfate 325 (65 FE) MG tablet Take 1 tablet (325 mg total) by mouth 2 (two) times daily with a meal. 180 tablet 3  . fish oil-omega-3  fatty acids 1000 MG capsule Take 1,200 mg by mouth daily.     Marland Kitchen glipiZIDE (GLUCOTROL XL) 2.5 MG 24 hr tablet Take 1 tablet (2.5 mg total) by mouth daily with breakfast. 90 tablet 2  . glucose blood (FREESTYLE LITE) test strip USE TO CHECK BLOOD SUGAR ONCE DAILY. E11.59 100 strip 12  . Multiple Vitamin (MULTIVITAMIN WITH MINERALS) TABS Take 1 tablet by mouth daily.    . Potassium 99 MG TABS Take 99 mg by mouth daily.     Marland Kitchen  pravastatin (PRAVACHOL) 40 MG tablet Take 1 tablet (40 mg total) by mouth daily. 90 tablet 0  . PRESCRIPTION MEDICATION Uses C-PAP at bedtime    . valsartan (DIOVAN) 320 MG tablet Take 1 tablet (320 mg total) by mouth daily. 90 tablet 3  . furosemide (LASIX) 20 MG tablet Take 1 tablet (20 mg total) by mouth as needed. FOR LEG SWELLING 90 tablet 3   No current facility-administered medications for this visit.    Socially He is from [place]; he is has 2 children 3 grandchildren. It smoked for over 40 years and quit smoking in March 2014. Does drink occasional alcohol. He does exercise. He is originally from [place]. He is looking [place] area for 4 years  ROS General: Negative; No fevers, chills, or night sweats;  HEENT: Negative; No changes in vision or hearing, sinus congestion, difficulty swallowing Pulmonary: Negative; No cough, wheezing, shortness of breath, hemoptysis Cardiovascular: Negative; No chest pain, presyncope, syncope, palpitations GI: Negative; No nausea, vomiting, diarrhea, or abdominal pain GU: Positive for urination at least 2 times per night. Musculoskeletal: Negative; no myalgias, joint pain, or weakness History of gout Hematologic/Oncology: Negative; no easy bruising, bleeding Endocrine: Negative; no heat/cold intolerance; no diabetes Neuro: Negative; no changes in balance, headaches Skin: Negative; No rashes or skin lesions Psychiatric: Negative; No behavioral problems, depression Sleep: Positive for severe obstructive sleep  apnea on CPAP therapy.  No residual snoring or daytime sleepiness, hypersomnolence, bruxism, restless legs, hypnogognic hallucinations, no cataplexy Other comprehensive 14 point system review is negative.  PE BP (!) 150/74   Pulse 60   Temp (!) 97.3 F (36.3 C)   Ht _0  (1.727 m)   Wt 218 lb (98.9 kg)   SpO2 97%   BMI 33.15 kg/m    Repeat blood pressure by me was elevated at 158/80.  Wt Readings from Last 3 Encounters:  07/03/19 218 lb (98.9 kg)  06/19/19 218 lb (98.9 kg)  05/16/19 220 lb 6.4 oz (100 kg)   General: Alert, oriented, no distress.  Skin: normal turgor, no rashes, warm and dry HEENT: Normocephalic, atraumatic. Pupils equal round and reactive to light; sclera anicteric; extraocular muscles intact; Nose without nasal septal hypertrophy Mouth/Parynx benign; Mallinpatti scale 3 Neck: No JVD, no carotid bruits; normal carotid upstroke Lungs: clear to ausculatation and percussion; no wheezing or rales Chest wall: without tenderness to palpitation Heart: PMI not displaced, RRR, s1 s2 normal, 1/6 systolic murmur, no diastolic murmur, no rubs, gallops, thrills, or heaves Abdomen: Central adiposity soft, nontender; no hepatosplenomehaly, BS+; abdominal aorta nontender and not dilated by palpation. Back: no CVA tenderness Pulses 2+ Musculoskeletal: full range of motion, normal strength, no joint deformities Extremities: 1+ pretibial be edema above the sock line for which the patient was unaware no clubbing cyanosis, Homan's sign negative  Neurologic: grossly nonfocal; Cranial nerves grossly wnl Psychologic: Normal mood and affect  I personally reviewed the most recent ECG from May 16, 2019 which revealed sinus bradycardia at 56 bpm, right bundle branch block and inferior T wave abnormality.  August 2018 ECG (independently read by me): Sinus bradycardia at 54 bpm.  Right bundle-branch block with repolarization changes.  Normal intervals.  LABS:  BMP Latest Ref Rng &  Units 04/17/2019 02/19/2019 12/08/2018  Glucose 65 - 99 mg/dL 119(H) 112(H) 122(H)  BUN 7 - 25 mg/dL _1 Creatinine 0.70 - 1.18 mg/dL 1.71(H) 1.64(H) 1.66(H)  BUN/Creat Ratio 6 - 22 (calc) _2 Sodium  135 - 146 mmol/L 141 141 141  Potassium 3.5 - 5.3 mmol/L 4.0 4.2 3.7  Chloride 98 - 110 mmol/L 108 107 108  CO2 20 - 32 mmol/L _0 Calcium 8.6 - 10.3 mg/dL 9.5 10.1 9.4    Hepatic Function Latest Ref Rng & Units 04/17/2019 02/19/2019 12/08/2018  Total Protein 6.1 - 8.1 g/dL 6.4 6.9 6.0(L)  Albumin 3.6 - 5.1 g/dL - - -  AST 10 - 35 U/L _1 ALT 9 - 46 U/L _2 Alk Phosphatase 40 - 115 U/L - - -  Total Bilirubin 0.2 - 1.2 mg/dL 0.4 0.3 0.3    CBC Latest Ref Rng & Units 04/17/2019 02/19/2019 12/08/2018  WBC 3.8 - 10.8 Thousand/uL 8.9 8.5 8.7  Hemoglobin 13.2 - 17.1 g/dL 12.5(L) 13.9 12.1(L)  Hematocrit 38.5 - 50.0 % 37.9(L) 41.8 36.4(L)  Platelets 140 - 400 Thousand/uL 263 261 325    Lab Results  Component Value Date   MCV 84.2 04/17/2019   MCV 83.9 02/19/2019   MCV 83.1 12/08/2018    Lab Results  Component Value Date   TSH 1.82 04/08/2016   Lab Results  Component Value Date   HGBA1C 6.3 (A) 06/19/2019   Lipid Panel     Component Value Date/Time   CHOL 223 (H) 04/17/2019 0855   TRIG 162 (H) 04/17/2019 0855   HDL 52 04/17/2019 0855   CHOLHDL 4.3 04/17/2019 0855   VLDL 19 04/08/2016 0828   LDLCALC 141 (H) 04/17/2019 0855    RADIOLOGY: No results found.  IMPRESSION: 1. OSA (obstructive sleep apnea)   2. Dyslipidemia, goal LDL below 70   3. Essential hypertension   4. Bilateral lower extremity edema   5. History of gout    ASSESSMENT AND PLAN:  Mr. Huckeby is a 25 -year-old African-American male who has a history of obstructive sleep apnea for over 20 years.  He never used CPAP until 2014 and commenced after his evaluation at [facility], which revealed severe obstructive sleep apnea with an AHI of 69.3/h and during REM  sleep 71.5 per hour.  At that time he had significantly loud snoring, frequent nocturia, and oxygen desaturation.  He had loud snoring and significant oxygen desaturation.  His CPAP machine is a Adult nurse.  When I last saw him in 2018, AHI was 1.4 at 18 cm water pressure.  I have not seen him in close to 3 years.  He has continued to use CPAP with excellent compliance and admits to almost 100% compliance with a rare instance where he has not used the machine.  I discussed with him optimal sleep duration of at least 7 to 8 hours if at all possible.  His machine is now starting to malfunction.  His machine is from 2013.  He qualifies for a new machine and I am recommending he obtain a new ResMed air sense 10 CPAP auto unit.  [employer] is his DME company who has bought out [employer].  He uses a full facemask.  I also discussed with him new mask technology.  We will place an order for his new ResMed machine need to see him within 90 days of initiation.  His blood pressure today is elevated.  He also has pretibial edema above his sock line.  He has a history of gout.  I have him a prescription for furosemide 20 mg to take perhaps several times per week which  should not increase his uric acid level.  I discussed optimal blood pressure control with ideal blood pressure less than 120/80 in stage I hypertension commencing at 130/80.  We also discussed more aggressive lipid management with target LDL less than 70 particularly with his underlying diabetes.  Most recent lipid panel was elevated and he had stated that that was done when he was taken off Crestor.  I have recommended follow-up with Dr. Ellyn Hack for optimal blood pressure and lipid management if he cannot tolerate rosuvastatin consider adding Zetia 10 mg to his current dose of statin therapy.  I discussed the importance of weight loss with his BMI of 33.15.  I will see him in 3 months for reevaluation or sooner as needed.   He is followed by  [employer] as his DME company.  I reviewed his most recent download with him in detail.  He is AHI is excellent.  He is requiring a high pressure and is set at 18 cm.  Although he is meeting compliance standards, I have strongly recommended that he increase his sleep duration since he is only sleeping 6 hours and 1 minute with CPAP therapy.  I again had long discussion with him regarding the importance of optimal sleep and the adverse consequences if his sleep apnea is under treated with reference to cardiovascular risk.  He is mildly obese and weight loss was recommended.  I encouraged him to continue his exercise since there is data concerning that exercise can attenuate sleep apnea.  His blood pressure today is controlled on valsartan HCT 320/25.  He has type 2 diabetes mellitus on metformin.  From a sleep perspective.  I will see him in one-year.  He will return to Dr. Ellyn Hack for follow-up Cardiologic evaluation. Time spent: 25 minutes  Troy Sine, MD, [facility]  07/03/2019 8:56 AM

## 2019-07-26 DIAGNOSIS — I2510 Atherosclerotic heart disease of native coronary artery without angina pectoris: Secondary | ICD-10-CM | POA: Diagnosis not present

## 2019-07-26 DIAGNOSIS — E785 Hyperlipidemia, unspecified: Secondary | ICD-10-CM | POA: Diagnosis not present

## 2019-07-27 LAB — LIPID PANEL
Chol/HDL Ratio: 4 ratio (ref 0.0–5.0)
Cholesterol, Total: 231 mg/dL — ABNORMAL HIGH (ref 100–199)
HDL: 58 mg/dL (ref >39)
LDL Chol Calc (NIH): 142 mg/dL — ABNORMAL HIGH (ref 0–99)
Triglycerides: 173 mg/dL — ABNORMAL HIGH (ref 0–149)
VLDL Cholesterol Cal: 31 mg/dL (ref 5–40)

## 2019-07-27 LAB — HEPATIC FUNCTION PANEL
ALT: 16 IU/L (ref 0–44)
AST: 21 IU/L (ref 0–40)
Albumin: 4.4 g/dL (ref 3.7–4.7)
Alkaline Phosphatase: 102 IU/L (ref 48–121)
Bilirubin Total: 0.2 mg/dL (ref 0.0–1.2)
Bilirubin, Direct: 0.08 mg/dL (ref 0.00–0.40)
Total Protein: 6.9 g/dL (ref 6.0–8.5)

## 2019-09-04 ENCOUNTER — Other Ambulatory Visit: Payer: Self-pay

## 2019-09-04 DIAGNOSIS — E1159 Type 2 diabetes mellitus with other circulatory complications: Secondary | ICD-10-CM

## 2019-09-04 MED ORDER — GLIPIZIDE ER 2.5 MG PO TB24: 2.5000 mg | ORAL_TABLET | Freq: Every day | ORAL | 1 refills | Status: DC

## 2019-09-07 ENCOUNTER — Telehealth: Payer: Self-pay | Admitting: Cardiovascular Disease

## 2019-09-07 DIAGNOSIS — E785 Hyperlipidemia, unspecified: Secondary | ICD-10-CM

## 2019-09-07 MED ORDER — ROSUVASTATIN CALCIUM 20 MG PO TABS
20.0000 mg | ORAL_TABLET | Freq: Every day | ORAL | 3 refills | Status: DC
Start: 2019-09-07 — End: 2020-04-08

## 2019-09-07 NOTE — Telephone Encounter (Signed)
Patient made aware of results and verbalized understanding. Lab orders placed.    Unfortunately, lipids look worse this time around than before.  Pravastatin does not seem to be doing the job -let us start off with switching to Crestor 20 mg daily. -> Recheck lipids in 3-4 months.  Bryan Lemma

## 2019-09-07 NOTE — Telephone Encounter (Signed)
Follow Up:        Pt is returning a call from yesterday, he did not know who called him.

## 2019-10-02 ENCOUNTER — Telehealth: Payer: Self-pay | Admitting: Pharmacist

## 2019-10-02 NOTE — Chronic Care Management (AMB) (Signed)
_  Chronic Care Management Pharmacy Assistant   Name: James Jones  MRN: [id number] DOB: 07-Jul-1945  Reason for Encounter: Disease State/ Hyperlipidemia Adherence Call   PCP : Donita Brooks, MD  Allergies:  No Known Allergies  Medications: Outpatient Encounter Medications as of 10/02/2019  Medication Sig   allopurinol (ZYLOPRIM) 100 MG tablet Take 1 tablet (100 mg total) by mouth daily.   amLODipine (NORVASC) 10 MG tablet Take 1 tablet (10 mg total) by mouth daily.   atenolol (TENORMIN) 50 MG tablet Take 1 tablet (50 mg total) by mouth daily.   Blood Glucose Monitoring Suppl (FREESTYLE LITE) DEVI Use to check blood sugar once daily   Cholecalciferol (VITAMIN D-3) 1000 UNITS CAPS Take by mouth daily. 2 capsules.   ferrous sulfate 325 (65 FE) MG tablet Take 1 tablet (325 mg total) by mouth 2 (two) times daily with a meal.   fish oil-omega-3 fatty acids 1000 MG capsule Take 1,200 mg by mouth daily.    furosemide (LASIX) 20 MG tablet Take 1 tablet (20 mg total) by mouth as needed. FOR LEG SWELLING   glipiZIDE (GLUCOTROL XL) 2.5 MG 24 hr tablet Take 1 tablet (2.5 mg total) by mouth daily with breakfast.   glucose blood (FREESTYLE LITE) test strip USE TO CHECK BLOOD SUGAR ONCE DAILY. E11.59   Multiple Vitamin (MULTIVITAMIN WITH MINERALS) TABS Take 1 tablet by mouth daily.   Potassium 99 MG TABS Take 99 mg by mouth daily.    pravastatin (PRAVACHOL) 40 MG tablet Take 1 tablet (40 mg total) by mouth daily.   PRESCRIPTION MEDICATION Uses C-PAP at bedtime   rosuvastatin (CRESTOR) 20 MG tablet Take 1 tablet (20 mg total) by mouth daily.   valsartan (DIOVAN) 320 MG tablet Take 1 tablet (320 mg total) by mouth daily.   No facility-administered encounter medications on file as of 10/02/2019.    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 03/18/2012   CAD (coronary artery disease), with 60-70% stenosis in RCA 03/18/2012   Essential hypertension 03/17/2012   RBBB 03/17/2012   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    Comprehensive medication review performed; Spoke to patient regarding cholesterol  Lipid Panel    Component Value Date/Time   CHOL 231 (H) 07/26/2019 0736   TRIG 173 (H) 07/26/2019 0736   HDL 58 07/26/2019 0736   LDLCALC 142 (H) 07/26/2019 0736   LDLCALC 141 (H) 04/17/2019 0855    10-year ASCVD risk score: The 10-year ASCVD risk score Denman George DC Montez Hageman., et al., 2013) is: 47.5%   Values used to calculate the score:     Age: 74 years     Sex: Male     Is Non-Hispanic African American: Yes     Diabetic: Yes     Tobacco smoker: No     Systolic Blood Pressure: 150 mmHg     Is BP treated: Yes     HDL Cholesterol: 58 mg/dL  Total Cholesterol: 231 mg/dL   Current antihyperlipidemic regimen:  o  Rosuvastatin Calcium 20 mg once daily  Previous antihyperlipidemic medications tried: Pravastatin 40 mg once daily    ASCVD risk enhancing conditions: age >32, DM, HTN, CKD, CHF, current smoker  What recent interventions/DTPs have been made by any provider to improve Cholesterol control since last CPP Visit: Patient saw Dr Tresa Endo (Cardiologist) on 09/07/19, Pravastatin discontinued, Rosuvastatin Calcium 20 mg started.   Any recent hospitalizations or ED visits since last visit with CPP? No  What diet changes have been made to improve Cholesterol?  o Patient has not made any  diet improvement, he still admits to eating fried food everyday, fried fish, chicken and pork chops.   What exercise is being done to improve Cholesterol?  o Patient states he walks 6 miles on Monday, Wednesday and Friday at the [facility].  Adherence Review: Does the patient have >5 day gap between last estimated fill dates? No    Follow-Up:  Pharmacist Review- Spoke with patient regarding Hyperlipidemia Adherence, patient admits to eating fried foods daily but is getting exercise 3 times a week. Patient did complain of left side hip and thigh pains for about 1 week, he thinks this is from the change of statins from Pravastatin to Rosuvastatin that was started on 09/07/19 by Cardiologist Dr. Tresa Endo. Patient is taking medication at bedtime. Patient is requesting an office visit with Dr Tanya Nones. Willa Frater, CPP notified.   Billee Cashing, CMA Clinical Pharmacist Assistant [phone]

## 2019-10-04 ENCOUNTER — Ambulatory Visit: Payer: Medicare Other | Admitting: Cardiology

## 2019-10-08 ENCOUNTER — Ambulatory Visit (INDEPENDENT_AMBULATORY_CARE_PROVIDER_SITE_OTHER): Payer: Medicare Other | Admitting: Cardiovascular Disease

## 2019-10-08 ENCOUNTER — Telehealth: Payer: Self-pay | Admitting: *Deleted

## 2019-10-08 ENCOUNTER — Other Ambulatory Visit: Payer: Self-pay

## 2019-10-08 ENCOUNTER — Encounter: Payer: Self-pay | Admitting: Cardiovascular Disease

## 2019-10-08 VITALS — BP 136/72 | HR 60 | Ht 68.0 in | Wt 205.0 lb

## 2019-10-08 DIAGNOSIS — E785 Hyperlipidemia, unspecified: Secondary | ICD-10-CM

## 2019-10-08 DIAGNOSIS — I2510 Atherosclerotic heart disease of native coronary artery without angina pectoris: Secondary | ICD-10-CM

## 2019-10-08 DIAGNOSIS — R600 Localized edema: Secondary | ICD-10-CM | POA: Diagnosis not present

## 2019-10-08 DIAGNOSIS — G4733 Obstructive sleep apnea (adult) (pediatric): Secondary | ICD-10-CM

## 2019-10-08 DIAGNOSIS — I10 Essential (primary) hypertension: Secondary | ICD-10-CM

## 2019-10-08 MED ORDER — VALSARTAN 320 MG PO TABS: 320.0000 mg | ORAL_TABLET | Freq: Every day | ORAL | 0 refills | Status: DC

## 2019-10-08 NOTE — Telephone Encounter (Signed)
Will forward to Dr Herbie Baltimore and Dr Duke Salvia for review

## 2019-10-08 NOTE — Patient Instructions (Signed)

## 2019-10-08 NOTE — Telephone Encounter (Signed)
-----   Message from Adline Peals, [place] sent at 10/08/2019  8:46 AM EDT ----- Patient is requesting a change of provider from dr. Herbie Baltimore to dr. Duke Salvia.

## 2019-10-08 NOTE — Progress Notes (Signed)
Patient ID: James Jones, male   DOB: 09-03-1945, 74 y.o.   MRN: [id number]    Primary cardiology: Dr. Glenetta Hew. Primary: Jenna Luo, MD  HPI: [name], is a 74 y.o. male who presents to the office today for a follow-up sleep clinic evaluation.   [name] originally from [place] to help. While in [place] in the 1990's  he was diagnosed with sleep apnea. He was given a CPAP unit. However, he never used CPAP. He has a history of hypertension, hyperlipidemia, diabetes mellitus, GERD, and remote tobacco use. He smoked for approximately 45 years but quit smoking in March 2000.  In February 2014  he was referred for a split-night sleep study. This confirmed severe sleep apnea. At that time he has significant daytime sleepiness with an Epworth scale of 15. AHI was 69.3 per hour and REM sleep was increased at 71.5 events per hour. He dropped his O2 saturation to 81% with REM sleep and had loud snoring. He underwent a split-night evaluation. He also was prescribed an 18 cm fixed water pressure.  Since initiating CPAP therapy, he does feel he is sleeping better he typically goes to have possibly 9:30 at night and wakes up around 5 to 5:30 in the morning. A download  from 06/21/2012 through June 2014 revealed 77% of the days with usage. He admits that he did not use it when he went on vacation. His average usage on days used to 6 hours and 2 minutes. He had 70% of days greater than 4 hours. HI was excellent at 1.4 is 18 cm water pressure.  When I last saw him in 2017 he was doing well.  He was compliant.  I saw him in September 13, 2016 at which time he was continuing to use CPAP regularly.   He typically goes to bed around 10 PM and wakes up between 5 and 6 AM.  I obtained a new download in the office today from June 16 through July 15.  This reveals usage stays at 87%, and uses greater than 4 hours at 77%.  He was averaging 6 hours 1 minute of sleep.  CPAP set pressure was 18  cm.  AHI was excellent at 1.4.  There was no significant leak.  He states he exercises 3 days per week, typically walks up to 4 miles.  He denied any chest pain or palpitations. An Epworth scale in the office today and this endorsed at 8 with moderate chance of dozing while watching TV, sitting inactive in a public place, lying down to rest in the afternoon when circumstances persist, and slight chance of dozing as a passenger in a car for an hour without a break and will sitting quietly after lunch without alcohol.  He was last seen after 3 years  in May 2021.  Over this time span, [name] states he has continued to be exceptionally compliant with CPAP use.  He states his use is at least 95 to close to 100% of the nights.  He typically goes to bed between 10 and 11 PM and wakes up at 5:30 AM.  He is sleeping well.  His sleep is restorative.  He is unaware of breakthrough snoring.  However he still has nocturia at least 2-3 times per night. A new Epworth Sleepiness Scale score was calculated in the office and this endorsed at 5 the high chance of dozing while watching television and a moderate chance of dozing while sitting inactive  in a public place arguing against residual daytime sleepiness.  He  saw Dr. Ellyn Hack for his primary cardiology care.  He is on amlodipine 10 mg, atenolol 50 mg and valsartan 3 20 mg for hypertension.  Remotely he was on Crestor and apparently at that time he was on pravastatin for lipid management.  Of note, LDL cholesterol was elevated at 141 on April 17, 2019 with total cholesterol 223.  He is diabetic on glipizide.  With his LDL increase, he was subsequently started back on rosuvastatin 20 mg in place of pravastatin.  Over the last several months, Mr. Abair states that he has done well.  He apparently plans to reestablish his cardiology care with Dr. Oval Linsey.  When I saw him in May 2021, he qualified for a new ResMed air sense 10 CPAP auto unit.  He received a new machine.  A  new download was obtained in the office today from July 31 through October 07, 2019 which shows 100% compliance with average usage at 6 hours and 41 minutes. His AutoSet unit minimum setting was 5 up to a maximum of 20.  95th percentile pressure was 14.5 with a maximum average pressure of 16.9.  AHI calculated at 4.0.  He continues to be on amlodipine 10 mg, atenolol 50 mg, furosemide as needed, and valsartan 320 mg daily for hypertension.  He is diabetic on glipizide.  He continues to be on rosuvastatin and has not had follow-up laboratory since this had been reinstituted.   Past Medical History:  Diagnosis Date  . 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 Coronary artery disease, non-occlusive 03/18/2012   60-70% stenosis in RCA -- FFR 0.84; EF 60-65%  . Diabetes mellitus without complication ([place])   . GERD (gastroesophageal reflux disease)    On PPI  . History of GI bleed January 2015   No obvious findings on EGD/colonoscopy  . Hydrocele, bilateral    large on [place] 2020  . Hyperlipidemia   . Hypertension   . OSA (obstructive sleep apnea) 03/18/2012   Doing better with CPAP  . Pulmonary hypertension ([place]) 12/10/2010   ECHO:  Mild PH,mild LVH; PA pressures estimated 30-40 mmHg  . 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   Event monitor; usually during sleeping hours; therefore not on beta blocker    Past Surgical History:  Procedure Laterality Date  . APPENDECTOMY  1974  . CARPAL TUNNEL RELEASE    . EYE SURGERY  1960   Left eye  . HEMORRHOID SURGERY    . LEFT HEART CATH AND CORONARY ANGIOGRAPHY  03/27/2010   Moderate mid RCA lesion,right radial approach,normal EF  . LEFT HEART CATHETERIZATION WITH CORONARY ANGIOGRAM N/A 03/17/2012   Procedure: LEFT HEART CATHETERIZATION WITH CORONARY ANGIOGRAM;  Surgeon: Leonie Man, MD;  Location: [facility] CATH LAB;  Service: Cardiovascular: Fractional Flow Reserve Measurement of mid RCA 60-70% stenosis = 0.84.  Otherwise mild LCA CAD.  Normal EF & EDP  . SHOULDER ARTHROSCOPY  2006  . TRANSTHORACIC ECHOCARDIOGRAM  12/2010    Mild concentric LVH.  EF> 55%.  GR 1 DD.  Mild aortic sclerosis no stenosis.  PA pressures estimated 30-40 mmHg    No Known Allergies  Current Outpatient Medications  Medication Sig Dispense Refill  . allopurinol (ZYLOPRIM) 100 MG tablet Take 1 tablet (100 mg total) by mouth daily. 90 tablet 2  . amLODipine (NORVASC) 10 MG tablet Take 1 tablet (10 mg total) by mouth daily. 90 tablet  3  . atenolol (TENORMIN) 50 MG tablet Take 1 tablet (50 mg total) by mouth daily. 90 tablet 3  . Blood Glucose Monitoring Suppl (FREESTYLE LITE) DEVI Use to check blood sugar once daily 1 each 0  . Cholecalciferol (VITAMIN D-3) 1000 UNITS CAPS Take by mouth daily. 2 capsules.    . ferrous sulfate 325 (65 FE) MG tablet Take 1 tablet (325 mg total) by mouth 2 (two) times daily with a meal. 180 tablet 3  . fish oil-omega-3 fatty acids 1000 MG capsule Take 1,200 mg by mouth daily.     Marland Kitchen glipiZIDE (GLUCOTROL XL) 2.5 MG 24 hr tablet Take 1 tablet (2.5 mg total) by mouth daily with breakfast. 90 tablet 1  . glucose blood (FREESTYLE LITE) test strip USE TO CHECK BLOOD SUGAR ONCE DAILY. E11.59 100 strip 12  . Multiple Vitamin (MULTIVITAMIN WITH MINERALS) TABS Take 1 tablet by mouth daily.    . Potassium 99 MG TABS Take 99 mg by mouth daily.     . pravastatin (PRAVACHOL) 40 MG tablet Take 1 tablet (40 mg total) by mouth daily. 90 tablet 0  . PRESCRIPTION MEDICATION Uses C-PAP at bedtime    . rosuvastatin (CRESTOR) 20 MG tablet Take 1 tablet (20 mg total) by mouth daily. 90 tablet 3  . valsartan (DIOVAN) 320 MG tablet Take 1 tablet (320 mg total) by mouth daily. 14 tablet 0  . furosemide (LASIX) 20 MG tablet Take 1 tablet (20 mg total) by mouth as needed. FOR LEG SWELLING 90 tablet 3  . predniSONE (DELTASONE) 20 MG tablet 3 tabs poqday 1-2, 2 tabs poqday 3-4, 1 tab poqday 5-6 12 tablet 0   No current  facility-administered medications for this visit.    Socially He is from [place]; he is has 2 children 3 grandchildren. It smoked for over 40 years and quit smoking in March 2014. Does drink occasional alcohol. He does exercise. He is originally from [place]. He is looking [place] area for 4 years  ROS General: Negative; No fevers, chills, or night sweats;  HEENT: Negative; No changes in vision or hearing, sinus congestion, difficulty swallowing Pulmonary: Negative; No cough, wheezing, shortness of breath, hemoptysis Cardiovascular: Negative; No chest pain, presyncope, syncope, palpitations GI: Negative; No nausea, vomiting, diarrhea, or abdominal pain GU: Positive for urination at least 2 times per night. Musculoskeletal: Negative; no myalgias, joint pain, or weakness History of gout Hematologic/Oncology: Negative; no easy bruising, bleeding Endocrine: Negative; no heat/cold intolerance; no diabetes Neuro: Negative; no changes in balance, headaches Skin: Negative; No rashes or skin lesions Psychiatric: Negative; No behavioral problems, depression Sleep: Positive for severe obstructive sleep apnea on CPAP therapy.  No residual snoring or daytime sleepiness, hypersomnolence, bruxism, restless legs, hypnogognic hallucinations, no cataplexy Other comprehensive 14 point system review is negative.  PE BP 136/72   Pulse 60   Ht 5' 8"  (1.727 m)   Wt 205 lb (93 kg)   SpO2 99%   BMI 31.17 kg/m    Repeat blood pressure by me was 130/78  Wt Readings from Last 3 Encounters:  10/09/19 209 lb (94.8 kg)  10/08/19 205 lb (93 kg)  07/03/19 218 lb (98.9 kg)   General: Alert, oriented, no distress.  He was wearing a [employer]. Skin: normal turgor, no rashes, warm and dry HEENT: Normocephalic, atraumatic. Pupils equal round and reactive to light; sclera anicteric; extraocular muscles intact;  Nose without nasal septal hypertrophy Mouth/Parynx benign; Mallinpatti scale  3 Neck:  No JVD, no carotid bruits; normal carotid upstroke Lungs: clear to ausculatation and percussion; no wheezing or rales Chest wall: without tenderness to palpitation Heart: PMI not displaced, RRR, s1 s2 normal, 1/6 systolic murmur, no diastolic murmur, no rubs, gallops, thrills, or heaves Abdomen: Central adiposity soft, nontender; no hepatosplenomehaly, BS+; abdominal aorta nontender and not dilated by palpation. Back: no CVA tenderness Pulses 2+ Musculoskeletal: full range of motion, normal strength, no joint deformities Extremities: no clubbing cyanosis or edema, Homan's sign negative  Neurologic: grossly nonfocal; Cranial nerves grossly wnl Psychologic: Normal mood and affect    I personally reviewed the ECG from May 16, 2019 which revealed sinus bradycardia at 56 bpm, right bundle branch block and inferior T wave abnormality.  August 2018 ECG (independently read by me): Sinus bradycardia at 54 bpm.  Right bundle-branch block with repolarization changes.  Normal intervals.  LABS:  BMP Latest Ref Rng & Units 04/17/2019 02/19/2019 12/08/2018  Glucose 65 - 99 mg/dL 119(H) 112(H) 122(H)  BUN 7 - 25 mg/dL 18 24 15   Creatinine 0.70 - 1.18 mg/dL 1.71(H) 1.64(H) 1.66(H)  BUN/Creat Ratio 6 - 22 (calc) 11 15 9   Sodium 135 - 146 mmol/L 141 141 141  Potassium 3.5 - 5.3 mmol/L 4.0 4.2 3.7  Chloride 98 - 110 mmol/L 108 107 108  CO2 20 - 32 mmol/L 22 21 22   Calcium 8.6 - 10.3 mg/dL 9.5 10.1 9.4    Hepatic Function Latest Ref Rng & Units 07/26/2019 04/17/2019 02/19/2019  Total Protein 6.0 - 8.5 g/dL 6.9 6.4 6.9  Albumin 3.7 - 4.7 g/dL 4.4 - -  AST 0 - 40 IU/L 21 22 26   ALT 0 - 44 IU/L 16 15 20   Alk Phosphatase 48 - 121 IU/L 102 - -  Total Bilirubin 0.0 - 1.2 mg/dL 0.2 0.4 0.3  Bilirubin, Direct 0.00 - 0.40 mg/dL 0.08 - -    CBC Latest Ref Rng & Units 04/17/2019 02/19/2019 12/08/2018  WBC 3.8 - 10.8 Thousand/uL 8.9 8.5 8.7  Hemoglobin 13.2 - 17.1 g/dL 12.5(L) 13.9 12.1(L)  Hematocrit 38  - 50 % 37.9(L) 41.8 36.4(L)  Platelets 140 - 400 Thousand/uL 263 261 325    Lab Results  Component Value Date   MCV 84.2 04/17/2019   MCV 83.9 02/19/2019   MCV 83.1 12/08/2018    Lab Results  Component Value Date   TSH 1.82 04/08/2016   Lab Results  Component Value Date   HGBA1C 6.3 (A) 06/19/2019   Lipid Panel     Component Value Date/Time   CHOL 231 (H) 07/26/2019 0736   TRIG 173 (H) 07/26/2019 0736   HDL 58 07/26/2019 0736   CHOLHDL 4.0 07/26/2019 0736   CHOLHDL 4.3 04/17/2019 0855   VLDL 19 04/08/2016 0828   LDLCALC 142 (H) 07/26/2019 0736   LDLCALC 141 (H) 04/17/2019 0855    RADIOLOGY: No results found.  IMPRESSION: 1. OSA (obstructive sleep apnea)   2. Essential hypertension   3. Hyperlipidemia with target LDL less than 70   4. Bilateral lower extremity edema      ASSESSMENT AND PLAN:  Mr. Lewin is a 62 -year-old African-American male who has a history of obstructive sleep apnea for over 20 years.  He never used CPAP until 2014 and commenced after his evaluation at [facility], which revealed severe obstructive sleep apnea with an AHI of 69.3/h and during REM sleep 71.5 per hour.  At that time he had significantly loud snoring, frequent nocturia,  and oxygen desaturation.   His initial CPAP machine was a Adult nurse.  When I  saw him in 2018, AHI was 1.4 at 18 cm water pressure.  I have not seen him in close to 3 years.  He  continued to use CPAP with excellent compliance and admits to almost 100% compliance with a rare instance where he has not used the machine.  I discussed with him optimal sleep duration of at least 7 to 8 hours if at all possible.  His machine is now starting to malfunction.  His machine is from 2013.  When I saw him in May 2021 he qualified for a new machine and I recommended he  obtain a new ResMed air sense 10 CPAP auto unit.  [employer] is his DME company who has bought out [employer] [facility] [employer].  A download was  obtained on his new machine which initially was set at a minimum pressure of 5 and maximum pressure of 20 cm.  He is 100% compliant with average use is at 6 hours and 41 minutes.  AHI was 4.0.  There was no mask leak.  I have recommended increasing his minimum pressure to start at 8 cm which should improve his AHI.  He is meeting compliance standards.  Blood pressure today is upper normal at 130/78 on his current medical regimen.  He continues to be on valsartan 320 mg, atenolol 50 mg and amlodipine 10 mg and takes furosemide as needed for leg swelling.  He is now on rosuvastatin.  He will need at some point a follow-up lipid study to make certain he is reaching his target LDL of less than 70.  He tells me he plans to establish cardiology care with Dr. Oval Linsey but does not have an appointment to see her yet.  He is diabetic.  I will see him in 1 year from a sleep perspective or sooner as needed.  Troy Sine, MD, [facility]  10/09/2019 6:20 PM

## 2019-10-09 ENCOUNTER — Ambulatory Visit (INDEPENDENT_AMBULATORY_CARE_PROVIDER_SITE_OTHER): Payer: Medicare Other | Admitting: Family Medicine

## 2019-10-09 ENCOUNTER — Encounter: Payer: Self-pay | Admitting: Cardiovascular Disease

## 2019-10-09 ENCOUNTER — Ambulatory Visit: Payer: Medicare Other | Admitting: Family Medicine

## 2019-10-09 VITALS — BP 120/70 | HR 62 | Temp 97.0°F | Ht 68.0 in | Wt 209.0 lb

## 2019-10-09 DIAGNOSIS — M5432 Sciatica, left side: Secondary | ICD-10-CM | POA: Diagnosis not present

## 2019-10-09 DIAGNOSIS — I2510 Atherosclerotic heart disease of native coronary artery without angina pectoris: Secondary | ICD-10-CM

## 2019-10-09 MED ORDER — PREDNISONE 20 MG PO TABS
ORAL_TABLET | ORAL | 0 refills | Status: DC
Start: 2019-10-09 — End: 2020-04-30

## 2019-10-09 NOTE — Telephone Encounter (Signed)
Fine by me 

## 2019-10-09 NOTE — Progress Notes (Signed)
Subjective:    Patient ID: James Jones, male    DOB: 04-30-1945, 74 y.o.   MRN: [id number]  HPI  Patient reports a 3-week history of pain beginning in his posterior left hip and radiating around his left hip into his left groin and down the anterior lateral aspect of his left leg to below his knee.  The pain is a constant ache.  It is a throbbing pain.  Standing on his leg does not make it worse.  Flexion, internal rotation, and external rotation of the left hip do not elicit any pain.  There is no tenderness to palpation over the greater trochanteric bursa.  He does have some pain in his left knee with flexion and extension however this pain is a deeper more intense pain that is not triggered by range of motion of the knee.  He denies any falls or injuries.  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 Past Medical History:  Diagnosis Date  . 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 Coronary artery disease, non-occlusive 03/18/2012   60-70% stenosis in RCA -- FFR 0.84; EF 60-65%  . Diabetes mellitus without complication ([facility])   . GERD (gastroesophageal reflux disease)    On PPI  . History of GI bleed January 2015   No obvious findings on EGD/colonoscopy  . Hydrocele, bilateral    large on [place] 2020  . Hyperlipidemia   . Hypertension   . OSA (obstructive sleep apnea) 03/18/2012   Doing better with CPAP  . Pulmonary hypertension ([facility]) 12/10/2010   ECHO:  Mild PH,mild LVH; PA pressures estimated 30-40 mmHg  . 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   Event monitor; usually during sleeping hours; therefore not on beta blocker    Past Surgical History:  Procedure Laterality Date  . APPENDECTOMY  1974  . CARPAL TUNNEL RELEASE    . EYE SURGERY  1960   Left eye  . HEMORRHOID SURGERY    . LEFT HEART CATH AND CORONARY ANGIOGRAPHY  03/27/2010   Moderate mid RCA lesion,right radial approach,normal EF  .  LEFT HEART CATHETERIZATION WITH CORONARY ANGIOGRAM N/A 03/17/2012   Procedure: LEFT HEART CATHETERIZATION WITH CORONARY ANGIOGRAM;  Surgeon: Marykay Lex, MD;  Location: [facility] CATH LAB;  Service: Cardiovascular: Fractional Flow Reserve Measurement of mid RCA 60-70% stenosis = 0.84. Otherwise mild LCA CAD.  Normal EF & EDP  . SHOULDER ARTHROSCOPY  2006  . TRANSTHORACIC ECHOCARDIOGRAM  12/2010    Mild concentric LVH.  EF> 55%.  GR 1 DD.  Mild aortic sclerosis no stenosis.  PA pressures estimated 30-40 mmHg   Current Outpatient Medications on File Prior to Visit  Medication Sig Dispense Refill  . allopurinol (ZYLOPRIM) 100 MG tablet Take 1 tablet (100 mg total) by mouth daily. 90 tablet 2  . amLODipine (NORVASC) 10 MG tablet Take 1 tablet (10 mg total) by mouth daily. 90 tablet 3  . atenolol (TENORMIN) 50 MG tablet Take 1 tablet (50 mg total) by mouth daily. 90 tablet 3  . Blood Glucose Monitoring Suppl (FREESTYLE LITE) DEVI Use to check blood sugar once daily 1 each 0  . Cholecalciferol (VITAMIN D-3) 1000 UNITS CAPS Take by mouth daily. 2 capsules.    . ferrous sulfate 325 (65 FE) MG tablet Take 1 tablet (325 mg total) by mouth 2 (two) times daily with a meal. 180 tablet 3  . fish oil-omega-3  fatty acids 1000 MG capsule Take 1,200 mg by mouth daily.     Marland Kitchen glipiZIDE (GLUCOTROL XL) 2.5 MG 24 hr tablet Take 1 tablet (2.5 mg total) by mouth daily with breakfast. 90 tablet 1  . glucose blood (FREESTYLE LITE) test strip USE TO CHECK BLOOD SUGAR ONCE DAILY. E11.59 100 strip 12  . Multiple Vitamin (MULTIVITAMIN WITH MINERALS) TABS Take 1 tablet by mouth daily.    . Potassium 99 MG TABS Take 99 mg by mouth daily.     . pravastatin (PRAVACHOL) 40 MG tablet Take 1 tablet (40 mg total) by mouth daily. 90 tablet 0  . PRESCRIPTION MEDICATION Uses C-PAP at bedtime    . rosuvastatin (CRESTOR) 20 MG tablet Take 1 tablet (20 mg total) by mouth daily. 90 tablet 3  . valsartan (DIOVAN) 320 MG tablet Take 1 tablet (320  mg total) by mouth daily. 14 tablet 0  . furosemide (LASIX) 20 MG tablet Take 1 tablet (20 mg total) by mouth as needed. FOR LEG SWELLING 90 tablet 3   No current facility-administered medications on file prior to visit.   No Known Allergies Social History   Socioeconomic History  . Marital status: Married    Spouse name: Not on file  . Number of children: Not on file  . Years of education: Not on file  . Highest education level: Not on file  Occupational History  . Not on file  Tobacco Use  . Smoking status: Former Smoker    Packs/day: 1.00    Types: Cigarettes    Quit date: 04/12/2012    Years since quitting: 7.4  . Smokeless tobacco: Never Used  Substance and Sexual Activity  . Alcohol use: Yes    Alcohol/week: 1.0 standard drink    Types: 1 Standard drinks or equivalent per week  . Drug use: Yes    Types: Marijuana    Comment: cannibus  . Sexual activity: Not on file  Other Topics Concern  . Not on file  Social History Narrative   Married, father of 2, grandfather of 3.   He is a former smoker of about a pack to pack and half cigarettes a day -- he quit in March of this year.    He is an avid exerciser working at least 4-5 days a week doing her walking or stationary bike.   Social Determinants of Health   Financial Resource Strain: Low Risk   . Difficulty of Paying Living Expenses: Not very hard  Food Insecurity:   . Worried About Programme researcher, broadcasting/film/video in the Last Year: Not on file  . Ran Out of Food in the Last Year: Not on file  Transportation Needs:   . Lack of Transportation (Medical): Not on file  . Lack of Transportation (Non-Medical): Not on file  Physical Activity:   . Days of Exercise per Week: Not on file  . Minutes of Exercise per Session: Not on file  Stress:   . Feeling of Stress : Not on file  Social Connections:   . Frequency of Communication with Friends and Family: Not on file  . Frequency of Social Gatherings with Friends and Family: Not on  file  . Attends Religious Services: Not on file  . Active Member of Clubs or Organizations: Not on file  . Attends Banker Meetings: Not on file  . Marital Status: Not on file  Intimate Partner Violence:   . Fear of Current or Ex-Partner: Not on file  .  Emotionally Abused: Not on file  . Physically Abused: Not on file  . Sexually Abused: Not on file      Review of Systems  All other systems reviewed and are negative.      Objective:   Physical Exam Vitals reviewed.  Constitutional:      Appearance: He is well-developed.  Neck:     Thyroid: No thyromegaly.     Vascular: No JVD.  Cardiovascular:     Rate and Rhythm: Normal rate and regular rhythm.     Heart sounds: Normal heart sounds. No murmur heard.   Pulmonary:     Effort: Pulmonary effort is normal. No respiratory distress.     Breath sounds: Normal breath sounds. No wheezing or rales.  Abdominal:     General: Bowel sounds are normal.     Palpations: Abdomen is soft.  Musculoskeletal:     Cervical back: Neck supple.     Lumbar back: Spasms and tenderness present. No bony tenderness. Decreased range of motion. Positive left straight leg raise test. Negative right straight leg raise test.     Left hip: No tenderness or bony tenderness. Normal range of motion. Normal strength.       Legs:  Lymphadenopathy:     Cervical: No cervical adenopathy.           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  Begin prednisone taper pack.  Recheck in 1 to 2 weeks if no better.  Repeat x-ray of the lumbar spine at that time and proceed with likely a referral for an epidural steroid injection if not improving.  Patient is due to recheck blood work in 1 month to check his cholesterol along with his blood sugar and liver and kidney function

## 2019-10-11 NOTE — Telephone Encounter (Signed)
OK 

## 2019-11-03 DIAGNOSIS — Z23 Encounter for immunization: Secondary | ICD-10-CM | POA: Diagnosis not present

## 2019-11-15 NOTE — Telephone Encounter (Signed)
Recall made for Dr Duke Salvia instead of Dr Herbie Baltimore, advised patient

## 2019-11-22 ENCOUNTER — Telehealth: Payer: Self-pay | Admitting: Pharmacist

## 2019-11-22 NOTE — Progress Notes (Signed)
Chronic Care Management Pharmacy Assistant   Name: James Jones  MRN: [id number] DOB: 1945-04-19  Reason for Encounter: Disease State  Patient Questions:  1.  Have you seen any other providers since your last visit? Yes  2.  Any changes in your medicines or health? Yes    PCP : Donita Brooks, MD   Their chronic conditions include: hypertension, CAD, Diabetes, dyslipidemia.  Office Visits: 10-09-2019 (PCP): Patient presented in the office c/o pain beginning in his posterior left hip and radiating around his left hip into his left groin and down the anterior lateral aspect of his left leg to below his knee for three weeks. Patient denies any falls or injuries. Per Dr. Tanya Nones, dx is left-sided sciatica. Prescribed Prednisone 20 mg taper pack.  Consults: 10-08-2019 (Cardiology): Patient presented in the office with Dr. Janalyn Harder f/u sleep eval. Patient recently received ResMed air sense 10 CPAP auto unit. A download of patient's CPAP usage was obtained at the office visit showing 100% compliance. Initial settings were at minimum pressure of 5 and maximum pressure of 20 cm. AHI was 4.0. Dr. Tresa Endo recommended increasing his minimum pressure to start at 8 cm which should improve his AHI. No medication changes.  Allergies:  No Known Allergies  Medications: Outpatient Encounter Medications as of 11/22/2019  Medication Sig  . allopurinol (ZYLOPRIM) 100 MG tablet Take 1 tablet (100 mg total) by mouth daily.  Marland Kitchen amLODipine (NORVASC) 10 MG tablet Take 1 tablet (10 mg total) by mouth daily.  Marland Kitchen atenolol (TENORMIN) 50 MG tablet Take 1 tablet (50 mg total) by mouth daily.  . Blood Glucose Monitoring Suppl (FREESTYLE LITE) DEVI Use to check blood sugar once daily  . Cholecalciferol (VITAMIN D-3) 1000 UNITS CAPS Take by mouth daily. 2 capsules.  . ferrous sulfate 325 (65 FE) MG tablet Take 1 tablet (325 mg total) by mouth 2 (two) times daily with a meal.  . fish oil-omega-3 fatty acids 1000 MG  capsule Take 1,200 mg by mouth daily.   . furosemide (LASIX) 20 MG tablet Take 1 tablet (20 mg total) by mouth as needed. FOR LEG SWELLING  . glipiZIDE (GLUCOTROL XL) 2.5 MG 24 hr tablet Take 1 tablet (2.5 mg total) by mouth daily with breakfast.  . glucose blood (FREESTYLE LITE) test strip USE TO CHECK BLOOD SUGAR ONCE DAILY. E11.59  . Multiple Vitamin (MULTIVITAMIN WITH MINERALS) TABS Take 1 tablet by mouth daily.  . Potassium 99 MG TABS Take 99 mg by mouth daily.   . pravastatin (PRAVACHOL) 40 MG tablet Take 1 tablet (40 mg total) by mouth daily.  . predniSONE (DELTASONE) 20 MG tablet 3 tabs poqday 1-2, 2 tabs poqday 3-4, 1 tab poqday 5-6  . PRESCRIPTION MEDICATION Uses C-PAP at bedtime  . rosuvastatin (CRESTOR) 20 MG tablet Take 1 tablet (20 mg total) by mouth daily.  . valsartan (DIOVAN) 320 MG tablet Take 1 tablet (320 mg total) by mouth daily.   No facility-administered encounter medications on file as of 11/22/2019.    Current Diagnosis: Patient Active Problem List   Diagnosis Date Noted  . Class 1 obesity with serious comorbidity and body mass index (BMI) of 30.0 to 30.9 in adult 02/13/2019  . Heartburn 03/24/2016  . OSA on CPAP 08/02/2015  . Controlled diabetes mellitus with circulatory complication, without long-term current use of insulin (HCC) 04/29/2014  . Acute blood loss anemia 02/13/2013  . Dyslipidemia, goal LDL below 70 10/15/2012  . Overweight (BMI 25.0-29.9) 10/02/2012  .  OSA (obstructive sleep apnea), has been cpap intolerant 03/18/2012  . 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 CAD (coronary artery disease), with 60-70% stenosis in RCA 03/18/2012  . Essential hypertension 03/17/2012  . RBBB 03/17/2012  . 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    Goals Addressed   None    Reviewed chart prior to disease state call. Spoke with patient regarding BP  Recent Office Vitals: BP Readings from Last 3 Encounters:    10/09/19 120/70  10/08/19 136/72  07/03/19 (!) 150/74   Pulse Readings from Last 3 Encounters:  10/09/19 62  10/08/19 60  07/03/19 60    Wt Readings from Last 3 Encounters:  10/09/19 209 lb (94.8 kg)  10/08/19 205 lb (93 kg)  07/03/19 218 lb (98.9 kg)     Kidney Function Lab Results  Component Value Date/Time   CREATININE 1.71 (H) 04/17/2019 08:55 AM   CREATININE 1.64 (H) 02/19/2019 09:29 AM   GFRNONAA 38 (L) 04/17/2019 08:55 AM   GFRAA 44 (L) 04/17/2019 08:55 AM    BMP Latest Ref Rng & Units 04/17/2019 02/19/2019 12/08/2018  Glucose 65 - 99 mg/dL 572(I) 203(T) 597(C)  BUN 7 - 25 mg/dL 18 24 15   Creatinine 0.70 - 1.18 mg/dL ) 1.63(A) 4.53(M)  BUN/Creat Ratio 6 - 22 (calc) 11 15 9   Sodium 135 - 146 mmol/L 141 141 141  Potassium 3.5 - 5.3 mmol/L 4.0 4.2 3.7  Chloride 98 - 110 mmol/L 108 107 108  CO2 20 - 32 mmol/L 22 21 22   Calcium 8.6 - 10.3 mg/dL 9.5 4.68(E 9.4    . Current antihypertensive regimen:  o Amlodipine 10 mg daily o  Atenolol 50 mg daily o  Valsartan 320 mg daily  . How often are you checking your Blood Pressure? infrequently   . Current home BP readings: Patient states he has not been checking his blood pressure often. The last blood pressure reading was 120/80 about two weeks.  . What recent interventions/DTPs have been made by any provider to improve Blood Pressure control since last CPP Visit: None  . Any recent hospitalizations or ED visits since last visit with CPP? No   . What diet changes have been made to improve Blood Pressure Control?  o Patient reports still eating fried foods. Advised to possibly add more vegetables into his diet if reducing fried foods seemed to be a burden.   . What exercise is being done to improve your Blood Pressure Control?  o Patient states he goes to the [facility] three days a week and walks about six miles.  Adherence Review: Is the patient currently on ACE/ARB medication? Yes Valsartan 320 mg Does the patient  have >5 day gap between last estimated fill dates? No CPP please confirm   Follow-Up:  Pharmacist Review   , [facility] Clinical Pharmacist Assistant [phone]

## 2019-12-25 ENCOUNTER — Ambulatory Visit (INDEPENDENT_AMBULATORY_CARE_PROVIDER_SITE_OTHER): Payer: Medicare Other | Admitting: Internal Medicine

## 2019-12-25 ENCOUNTER — Encounter: Payer: Self-pay | Admitting: Internal Medicine

## 2019-12-25 ENCOUNTER — Other Ambulatory Visit: Payer: Self-pay

## 2019-12-25 VITALS — BP 120/72 | HR 55 | Ht 68.0 in | Wt 212.4 lb

## 2019-12-25 DIAGNOSIS — E785 Hyperlipidemia, unspecified: Secondary | ICD-10-CM | POA: Diagnosis not present

## 2019-12-25 DIAGNOSIS — E669 Obesity, unspecified: Secondary | ICD-10-CM

## 2019-12-25 DIAGNOSIS — Z6830 Body mass index (BMI) 30.0-30.9, adult: Secondary | ICD-10-CM

## 2019-12-25 DIAGNOSIS — I2510 Atherosclerotic heart disease of native coronary artery without angina pectoris: Secondary | ICD-10-CM | POA: Diagnosis not present

## 2019-12-25 DIAGNOSIS — E1159 Type 2 diabetes mellitus with other circulatory complications: Secondary | ICD-10-CM | POA: Diagnosis not present

## 2019-12-25 LAB — POCT GLYCOSYLATED HEMOGLOBIN (HGB A1C): Hemoglobin A1C: 6.7 % — AB (ref 4.0–5.6)

## 2019-12-25 NOTE — Progress Notes (Signed)
Patient ID: James Jones, male   DOB: July 27, 1945, 74 y.o.   MRN: [id number]  This visit occurred during the SARS-CoV-2 public health emergency.  Safety protocols were in place, including screening questions prior to the visit, additional usage of staff PPE, and extensive cleaning of exam room while observing appropriate contact time as indicated for disinfecting solutions.   HPI: James Jones is a 74 y.o.-year-old male, returning for f/u for DM2, dx in ~2005, non-insulin-dependent, lately more controlled, with complications (CAD, CKD). Last visit 6 months ago. PCP: Dr. Lynnea FerrierWarren Pickard  Reviewed HbA1c levels: Lab Results  Component Value Date   HGBA1C 6.3 (A) 06/19/2019   HGBA1C 6.6 (A) 02/13/2019   HGBA1C 6.3 (H) 12/08/2018  04/11/2014: HbA1c 8.4% 01/08/2014: HbA1c 7.7% He had steroid inj for gout in his elbows - 12/2013. He also had a steroid inj in knee fall 2016, too.   He is on: - Glipizide ER 2.5 mg daily in a.m. We had to stop Januvia since last visit due to price.  Tradjenta was also not covered. Previously on low-dose Metformin ER, but stopped 07/2018 due to CKD Had nausea, vomiting, loss of appetite with regular metformin.  He was on Actos. He was on insulin before (Lantus) - came off years ago.  Pt checks his sugars 1-2 times a day: - am: 86-130 >> 79, 92-114 >> 104-133, 144 >> 107-129, 132 - 2h after b'fast: 107 >> n/c >> 119, 160 >> n/c - before lunch: 87 >> 86, 105 >> 119-142 >> 122, 140 - 2h after lunch: n/c >> 155, 285 (Prednisone) >> n/c - before dinner:  110-148 (snack) >> 87 >> n/c - 2h after dinner: 103 >> 89, 148 >> 79, 118 >> n/c - bedtime: 123, 146 >> 89-135 >> n/c - nighttimen/c >> 100 >> n/c  >> 122 >> n/c Lowest 54 x1 >>... 79 >> 79 >> 107; he has hypoglycemia awareness in the 70s. Highest sugar was 285 (Prednisone) >> 148 >> 144 >> 140.  Glucometer: Freestyle [employer] did not cover Freestyle Libre CGM.  Pt's meals are: - Breakfast: bacon + eggs,  sausage, grits, oatmeal, cereal, toast wheat - Lunch: BLT, soups, fruit, nuts - Dinner: chicken, pork + greens, rice  - Snacks: 1 a day: pretzels; carrots  Previously exercising by walking several times a week, but stopped exercising due to the coronavirus pandemic. [facility].  -+ CKD-sees nephrology (renal ultrasound showed renal cysts), latest BUN/creatinine:  Lab Results  Component Value Date   BUN 18 04/17/2019   CREATININE 1.71 (H) 04/17/2019  05/24/2014: 34/1.64 01/08/2014: 18/1.15 On valsartan. -+ HL; last set of lipids: Lab Results  Component Value Date   CHOL 231 (H) 07/26/2019   HDL 58 07/26/2019   LDLCALC 142 (H) 07/26/2019   TRIG 173 (H) 07/26/2019   CHOLHDL 4.0 07/26/2019  On rosuvastatin 20, fish oil.  He still has some muscle aches, improved.  He had more muscle aches when he was on pravastatin 40 before - last eye exam was in 05/2019: No DR.  He has history of cataract surgery in 2016 and 17. - no numbness and tingling in his feet.  Latest TSH was normal: Lab Results  Component Value Date   TSH 1.82 04/08/2016   ROS: Constitutional: no weight gain/+ weight loss, no fatigue, no subjective hyperthermia, no subjective hypothermia Eyes: no blurry vision, no xerophthalmia ENT: no sore throat, no nodules palpated in neck, no dysphagia, no odynophagia, no hoarseness Cardiovascular:  no CP/no SOB/no palpitations/no leg swelling Respiratory: no cough/no SOB/no wheezing Gastrointestinal: no N/no V/no D/no C/no acid reflux Musculoskeletal: + Improved muscle aches/no joint aches Skin: no rashes, no hair loss Neurological: no tremors/no numbness/no tingling/no dizziness  I reviewed pt's medications, allergies, PMH, social hx, family hx, and changes were documented in the history of present illness. Otherwise, unchanged from my initial visit note.   Past Medical History:  Diagnosis Date  . CKD (chronic kidney disease) stage 2, GFR 60-89 ml/min  03/17/2012  . Coronary artery disease, non-occlusive 03/18/2012   60-70% stenosis in RCA -- FFR 0.84; EF 60-65%  . Diabetes mellitus without complication (HCC)   . GERD (gastroesophageal reflux disease)    On PPI  . History of GI bleed January 2015   No obvious findings on EGD/colonoscopy  . Hydrocele, bilateral    large on [place] 2020  . Hyperlipidemia   . Hypertension   . OSA (obstructive sleep apnea) 03/18/2012   Doing better with CPAP  . Pulmonary hypertension (HCC) 12/10/2010   ECHO:  Mild PH,mild LVH; PA pressures estimated 30-40 mmHg  . 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   Event monitor; usually during sleeping hours; therefore not on beta blocker  + Gout  Past Surgical History:  Procedure Laterality Date  . APPENDECTOMY  1974  . CARPAL TUNNEL RELEASE    . EYE SURGERY  1960   Left eye  . HEMORRHOID SURGERY    . LEFT HEART CATH AND CORONARY ANGIOGRAPHY  03/27/2010   Moderate mid RCA lesion,right radial approach,normal EF  . LEFT HEART CATHETERIZATION WITH CORONARY ANGIOGRAM N/A 03/17/2012   Procedure: LEFT HEART CATHETERIZATION WITH CORONARY ANGIOGRAM;  Surgeon: Marykay Lex, MD;  Location[facility] C[facility] LAB;  Service: Cardiovascular: Fractional Flow Reserve Measurement of mid RCA 60-70% stenosis = 0.84. Otherwise mild LCA CAD.  Normal EF & EDP  . SHOULDER ARTHROSCOPY  2006  . TRANSTHORACIC ECHOCARDIOGRAM  12/2010    Mild concentric LVH.  EF> 55%.  GR 1 DD.  Mild aortic sclerosis no stenosis.  PA pressures estimated 30-40 mmHg   History   Social History  . Marital Status: Married    Spouse Name: N/A   Occupational History  . retired   Social History Main Topics  . Smoking status: Former Smoker -- 2.00 packs/day    Quit date: 04/12/2012  . Smokeless tobacco: Never Used  . Alcohol Use: 0.5 oz/week    1 drink(s) per week  . Drug Use: Yes    Special: Marijuana     Comment: cannibus   Social History Narrative   Married, father of 2, grandfather of 3.    He is a former smoker of about a pack to pack and half cigarettes a day -- he quit in March of this year.    He is an avid exerciser working at least 4-5 days a week doing her walking or stationary bike.   Current Outpatient Medications on File Prior to Visit  Medication Sig Dispense Refill  . allopurinol (ZYLOPRIM) 100 MG tablet Take 1 tablet (100 mg total) by mouth daily. 90 tablet 2  . amLODipine (NORVASC) 10 MG tablet Take 1 tablet (10 mg total) by mouth daily. 90 tablet 3  . atenolol (TENORMIN) 50 MG tablet Take 1 tablet (50 mg total) by mouth daily. 90 tablet 3  . Blood Glucose Monitoring Suppl (FREESTYLE LITE) DEVI Use to check blood sugar once daily 1 each 0  . Cholecalciferol (VITAMIN D-3) 1000 UNITS  CAPS Take by mouth daily. 2 capsules.    . ferrous sulfate 325 (65 FE) MG tablet Take 1 tablet (325 mg total) by mouth 2 (two) times daily with a meal. 180 tablet 3  . fish oil-omega-3 fatty acids 1000 MG capsule Take 1,200 mg by mouth daily.     . furosemide (LASIX) 20 MG tablet Take 1 tablet (20 mg total) by mouth as needed. FOR LEG SWELLING 90 tablet 3  . glipiZIDE (GLUCOTROL XL) 2.5 MG 24 hr tablet Take 1 tablet (2.5 mg total) by mouth daily with breakfast. 90 tablet 1  . glucose blood (FREESTYLE LITE) test strip USE TO CHECK BLOOD SUGAR ONCE DAILY. E11.59 100 strip 12  . Multiple Vitamin (MULTIVITAMIN WITH MINERALS) TABS Take 1 tablet by mouth daily.    . Potassium 99 MG TABS Take 99 mg by mouth daily.     . pravastatin (PRAVACHOL) 40 MG tablet Take 1 tablet (40 mg total) by mouth daily. 90 tablet 0  . predniSONE (DELTASONE) 20 MG tablet 3 tabs poqday 1-2, 2 tabs poqday 3-4, 1 tab poqday 5-6 12 tablet 0  . PRESCRIPTION MEDICATION Uses C-PAP at bedtime    . rosuvastatin (CRESTOR) 20 MG tablet Take 1 tablet (20 mg total) by mouth daily. 90 tablet 3  . valsartan (DIOVAN) 320 MG tablet Take 1 tablet (320 mg total) by mouth daily. 14 tablet 0   No current facility-administered medications  on file prior to visit.   No Known Allergies Family History  Problem Relation Age of Onset  . Kidney failure Mother   . Coronary artery disease Father   . Kidney failure Sister    PE: BP 120/72   Pulse (!) 55   Ht 5\' 8"  (1.727 m)   Wt 212 lb 6.4 oz (96.3 kg)   SpO2 95%   BMI 32.30 kg/m  Body mass index is 32.3 kg/m. Wt Readings from Last 3 Encounters:  12/25/19 212 lb 6.4 oz (96.3 kg)  10/09/19 209 lb (94.8 kg)  10/08/19 205 lb (93 kg)   Constitutional: overweight, in NAD Eyes: PERRLA, EOMI, no exophthalmos ENT: moist mucous membranes, no thyromegaly, no cervical lymphadenopathy Cardiovascular: RRR, No MRG Respiratory: CTA B Gastrointestinal: abdomen soft, NT, ND, BS+ Musculoskeletal: no deformities, strength intact in all 4 Skin: moist, warm, no rashes Neurological: no tremor with outstretched hands, DTR normal in all 4  ASSESSMENT: 1. DM2, non-insulin-dependent, uncontrolled, with complications - CAD - cardiologist Dr. 10/10/19 - CKD - was seeing Dr Herbie Baltimore >> changed  2. HL  3.  Obesity class I  PLAN:  1. Patient with longstanding, previously uncontrolled type 2 diabetes, with much improved control in the last 3.5 years.  She was on a DPP 4 inhibitor, however, he had to come off before last visit due to price and he is now back on sulfonylurea low-dose.  He was previously on Metformin ER but had to stop in 07/2018 due to CKD.  At last visit, HbA1c was still excellent, at 6.3%, at goal.  Of note, he is on iron, which can influence HbA1c levels. -At today's visit, he only brings a record of his blood sugars for the last 2 weeks.  The sugars are almost all at goal, with the exception of a slightly high CBG of 140 before lunch.  However, he only has 2 blood sugars checked later in the day, the rest of them are in the morning.  We discussed about the importance of checking blood sugars at different  times of the day and I strongly advised him to rotate his blood sugar  checks. - we checked his HbA1c: 6.8% (higher) - for now, we will continue the current regimen - I suggested to:  Patient Instructions  Continue glipizide XL 2.5 mg mg before breakfast.  Check sugars once a day, but rotate the check times.  Please return in 4-6 months with your sugar log.   - advised to check sugars at different times of the day - 1x a day, rotating check times - advised for yearly eye exams >> he is UTD - return to clinic in 4-6 months  2. HL -Reviewed his latest lipid panel from 07/2019: LDL above target, as are his triglycerides: Lab Results  Component Value Date   CHOL 231 (H) 07/26/2019   HDL 58 07/26/2019   LDLCALC 142 (H) 07/26/2019   TRIG 173 (H) 07/26/2019   CHOLHDL 4.0 07/26/2019  -He had myalgias on atorvastatin and also on rosuvastatin but at last visit was planning to go back to rosuvastatin since his muscle aches were more tolerable on this.  He is currently on Crestor 20.  3. Obesity class I -He gained weight during the coronavirus pandemic since he could not go to the gym and gained 13 more pounds before last visit.  At last visit, he weighed 218 lbs - now 212 lbs (lost 6 lbs net) -Unfortunately, before last visit, we had to switch from a DPP 4 inhibitor which was weight neutral, below sulfonylurea, which is weight inducing, due to price.  Carlus Pavlov, MD PhD [facility] Endocrinology

## 2019-12-25 NOTE — Patient Instructions (Addendum)
Continue glipizide XL 2.5 mg mg before breakfast.  Check sugars once a day, but rotate the check times.  Please return in 4-6 months with your sugar log.

## 2019-12-27 ENCOUNTER — Telehealth: Payer: Self-pay

## 2019-12-27 NOTE — Telephone Encounter (Signed)
Returning Pt phone call

## 2019-12-31 ENCOUNTER — Ambulatory Visit: Payer: Medicare Other

## 2019-12-31 DIAGNOSIS — E785 Hyperlipidemia, unspecified: Secondary | ICD-10-CM

## 2019-12-31 DIAGNOSIS — E1159 Type 2 diabetes mellitus with other circulatory complications: Secondary | ICD-10-CM

## 2019-12-31 DIAGNOSIS — I10 Essential (primary) hypertension: Secondary | ICD-10-CM

## 2019-12-31 NOTE — Patient Instructions (Addendum)
Visit Information  Goals Addressed            This Visit's Progress   . CARE PLAN       CARE PLAN ENTRY  Current Barriers:  . Chronic Disease Management support, education, and care coordination needs related to Hypertension, Hyperlipidemia, and Diabetes   Hypertension . Pharmacist Clinical Goal(s): o Over the next 30 days, patient will work with PharmD and providers to maintain BP goal <130/80 . Current regimen:  o Amlodipine 10mg  daily, atenolol 50mg  daily, valsartan 320mg  daily . Interventions: o Continue current therapy o Switch valsartan dosing to PM o Monitor BP daily . Patient self care activities - Over the next 180 days, patient will: o Check BP once daily, document, and provide at future appointments o Ensure daily salt intake < 2300 mg/day o Report consistent BP readings > 130/80 to PharmD or PCP.  Hyperlipidemia . Pharmacist Clinical Goal(s): o Over the next 30 days, patient will work with PharmD and providers to achieve LDL goal < 70. . Current regimen:  o Rosuvastatin 20mg  daily . Interventions: o Continue current therapy o RTC for lipid panel ASAP . Patient self care activities - Over the next 30 days, patient will: o Make appointment to have lipids rechecked to assess current medication effectiveness o Notify PharmD or PCP of any worsening muscle pain  Diabetes . Pharmacist Clinical Goal(s): o Over the next 30 days, patient will work with PharmD and providers to maintain A1c goal <7% . Current regimen:  o Glipizide XL 2.5mg  daily . Interventions: o Continue current therapy o Counseled on diet and importance of eating around all doses of medications that lower blood sugar. o Recommend checking blood sugar at another time during the day other than the morning a few times per week . Patient self care activities - Over the next 30 days, patient will: o Check blood sugar in the morning before eating or drinking, document, and provide at future  appointments o Contact provider with any episodes of hypoglycemia, or severely elevated blood sugars. o Keep blood sugar log and time of day checked  Please see past updates related to this goal by clicking on the "Past Updates" button in the selected goal         The patient verbalized understanding of instructions, educational materials, and care plan provided today and agreed to receive a mailed copy of patient instructions, educational materials, and care plan.   Telephone follow up appointment with pharmacy team member scheduled for: 30 days  , PharmD Clinical Pharmacist Family Medicine [phone]   Preventing High Cholesterol Cholesterol is a white, waxy substance similar to fat that the human body needs to help build cells. The liver makes all the cholesterol that a person's body needs. Having high cholesterol (hypercholesterolemia) increases a person's risk for heart disease and stroke. Extra (excess) cholesterol comes from the food the person eats. High cholesterol can often be prevented with diet and lifestyle changes. If you already have high cholesterol, you can control it with diet and lifestyle changes and with medicine. How can high cholesterol affect me? If you have high cholesterol, deposits (plaques) may build up on the walls of your arteries. The arteries are the blood vessels that carry blood away from your heart. Plaques make the arteries narrower and stiffer. This can limit or block blood flow and cause blood clots to form. Blood clots:  Are tiny balls of cells that form in your blood.  Can move  to the heart or brain, causing a heart attack or stroke. Plaques in arteries greatly increase your risk for heart attack and stroke.Making diet and lifestyle changes can reduce your risk for these conditions that may threaten your life. What can increase my risk? This condition is more likely to develop in people who:  Eat foods that are  high in saturated fat or cholesterol. Saturated fat is mostly found in: ? Foods that contain animal fat, such as red meat and some dairy products. ? Certain fatty foods made from plants, such as tropical oils.  Are overweight.  Are not getting enough exercise.  Have a family history of high cholesterol. What actions can I take to prevent this? Nutrition   Eat less saturated fat.  Avoid trans fats (partially hydrogenated oils). These are often found in margarine and in some baked goods, fried foods, and snacks bought in packages.  Avoid precooked or cured meat, such as sausages or meat loaves.  Avoid foods and drinks that have added sugars.  Eat more fruits, vegetables, and whole grains.  Choose healthy sources of protein, such as fish, poultry, lean cuts of red meat, beans, peas, lentils, and nuts.  Choose healthy sources of fat, such as: ? Nuts. ? Vegetable oils, especially olive oil. ? Fish that have healthy fats (omega-3 fatty acids), such as mackerel or salmon. The items listed above may not be a complete list of recommended foods and beverages. Contact a dietitian for more information. Lifestyle  Lose weight if you are overweight. Losing 5-10 lb (2.3-4.5 kg) can help prevent or control high cholesterol. It can also lower your risk for diabetes and high blood pressure. Ask your health care provider to help you with a diet and exercise plan to lose weight safely.  Do not use any products that contain nicotine or tobacco, such as cigarettes, e-cigarettes, and chewing tobacco. If you need help quitting, ask your health care provider.  Limit your alcohol intake. ? Do not drink alcohol if:  Your health care provider tells you not to drink.  You are pregnant, may be pregnant, or are planning to become pregnant. ? If you drink alcohol:  Limit how much you use to:  0-1 drink a day for women.  0-2 drinks a day for men.  Be aware of how much alcohol is in your drink. In  the [place] one drink equals one 12 oz bottle of beer (355 mL), one 5 oz glass of wine (148 mL), or one 1 oz glass of hard liquor (44 mL). Activity   Get enough exercise. Each week, do at least 150 minutes of exercise that takes a medium level of effort (moderate-intensity exercise). ? This is exercise that:  Makes your heart beat faster and makes you breathe harder than usual.  Allows you to still be able to talk. ? You could exercise in short sessions several times a day or longer sessions a few times a week. For example, on 5 days each week, you could walk fast or ride your bike 3 times a day for 10 minutes each time.  Do exercises as told by your health care provider. Medicines  In addition to diet and lifestyle changes, your health care provider may recommend medicines to help lower cholesterol. This may be a medicine to lower the amount of cholesterol your liver makes. You may need medicine if: ? Diet and lifestyle changes do not lower your cholesterol enough. ? You have high cholesterol and other risk factors for  heart disease or stroke.  Take over-the-counter and prescription medicines only as told by your health care provider. General information  Manage your risk factors for high cholesterol. Talk with your health care provider about all your risk factors and how to lower your risk.  Manage other conditions that you have, such as diabetes or high blood pressure (hypertension).  Have blood tests to check your cholesterol levels at regular points in time as told by your health care provider.  Keep all follow-up visits as told by your health care provider. This is important. Where to find more information  American Heart Association: www.heart.org  National Heart, Lung, and Blood Institute: [employer].is Summary  High cholesterol increases your risk for heart disease and stroke. By keeping your cholesterol level low, you can reduce your risk for these conditions.  High  cholesterol can often be prevented with diet and lifestyle changes.  Work with your health care provider to manage your risk factors, and have your blood tested regularly. This information is not intended to replace advice given to you by your health care provider. Make sure you discuss any questions you have with your health care provider. Document Revised: 05/19/2018 Document Reviewed: 10/04/2015 Elsevier Patient Education  2020 [employer].

## 2019-12-31 NOTE — Chronic Care Management (AMB) (Addendum)
Chronic Care Management Pharmacy  Name: James Jones  MRN: [id number] DOB: 10-Feb-1945  James Jones,  74 y.o. , male presents for their Follow-Up CCM visit with the clinical pharmacist In office.  PCP : Donita Brooks, MD  Their chronic conditions include: hypertension, CAD, Diabetes, dyslipidemia.  Office Visits:  04/17/2019 ([facility]) - statin was d/c previously d/t myalgias, but was restarted d/t LDL > 200.  LDL was still high after initiation of statin so fish oil was increased to 3000 mg daily.   03/08/2019 ([facility]) - gout in right knee, started prednisone taper pack  02/19/2019 ([facility]) - added atenolol 50mg  daily due to elevated BP,   Consult Visit:  12/25/2019 12/27/2019) - Patient had to dc Januvia due to cost, he is not on low dose sulfonylurea.  Also is taking rosuvastatin 20mg  due to more muscle pains on pravastatin.  No updated lipids since June.  06/19/2019 , Endocrinology) - continued all diabetic meds, may want to discuss switch back to rosuvastatin  Medications: Outpatient Encounter Medications as of 12/31/2019  Medication Sig   allopurinol (ZYLOPRIM) 100 MG tablet Take 1 tablet (100 mg total) by mouth daily.   amLODipine (NORVASC) 10 MG tablet Take 1 tablet (10 mg total) by mouth daily.   atenolol (TENORMIN) 50 MG tablet Take 1 tablet (50 mg total) by mouth daily.   Blood Glucose Monitoring Suppl (FREESTYLE LITE) DEVI Use to check blood sugar once daily   Cholecalciferol (VITAMIN D-3) 1000 UNITS CAPS Take by mouth daily. 2 capsules.   ferrous sulfate 325 (65 FE) MG tablet Take 1 tablet (325 mg total) by mouth 2 (two) times daily with a meal.   fish oil-omega-3 fatty acids 1000 MG capsule Take 1,200 mg by mouth daily.    glipiZIDE (GLUCOTROL XL) 2.5 MG 24 hr tablet Take 1 tablet (2.5 mg total) by mouth daily with breakfast.   glucose blood (FREESTYLE LITE) test strip USE TO CHECK BLOOD SUGAR ONCE DAILY. E11.59   Multiple Vitamin (MULTIVITAMIN WITH  MINERALS) TABS Take 1 tablet by mouth daily.   Potassium 99 MG TABS Take 99 mg by mouth daily.    predniSONE (DELTASONE) 20 MG tablet 3 tabs poqday 1-2, 2 tabs poqday 3-4, 1 tab poqday 5-6   PRESCRIPTION MEDICATION Uses C-PAP at bedtime   valsartan (DIOVAN) 320 MG tablet Take 1 tablet (320 mg total) by mouth daily.   furosemide (LASIX) 20 MG tablet Take 1 tablet (20 mg total) by mouth as needed. FOR LEG SWELLING   rosuvastatin (CRESTOR) 20 MG tablet Take 1 tablet (20 mg total) by mouth daily.   [DISCONTINUED] pravastatin (PRAVACHOL) 40 MG tablet Take 1 tablet (40 mg total) by mouth daily.   No facility-administered encounter medications on file as of 12/31/2019.     Current Diagnosis/Assessment:    01-13-1980: Low Risk    Difficulty of Paying Living Expenses: Not very hard     Goals Addressed             This Visit's Progress    CARE PLAN       CARE PLAN ENTRY  Current Barriers:  Chronic Disease Management support, education, and care coordination needs related to Hypertension, Hyperlipidemia, and Diabetes   Hypertension Pharmacist Clinical Goal(s): Over the next 30 days, patient will work with PharmD and providers to maintain BP goal <130/80 Current regimen:  Amlodipine 10mg  daily, atenolol 50mg  daily, valsartan 320mg  daily Interventions: Continue current therapy Switch valsartan dosing to PM Monitor BP daily Patient  self care activities - Over the next 180 days, patient will: Check BP once daily, document, and provide at future appointments Ensure daily salt intake < 2300 mg/day Report consistent BP readings > 130/80 to PharmD or PCP.  Hyperlipidemia Pharmacist Clinical Goal(s): Over the next 30 days, patient will work with PharmD and providers to achieve LDL goal < 70. Current regimen:  Rosuvastatin 20mg  daily Interventions: Continue current therapy RTC for lipid panel ASAP Patient self care activities - Over the next 30 days, patient  will: Make appointment to have lipids rechecked to assess current medication effectiveness Notify PharmD or PCP of any worsening muscle pain  Diabetes Pharmacist Clinical Goal(s): Over the next 30 days, patient will work with PharmD and providers to maintain A1c goal <7% Current regimen:  Glipizide XL 2.5mg  daily Interventions: Continue current therapy Counseled on diet and importance of eating around all doses of medications that lower blood sugar. Recommend checking blood sugar at another time during the day other than the morning a few times per week Patient self care activities - Over the next 30 days, patient will: Check blood sugar in the morning before eating or drinking, document, and provide at future appointments Contact provider with any episodes of hypoglycemia, or severely elevated blood sugars. Keep blood sugar log and time of day checked  Please see past updates related to this goal by clicking on the "Past Updates" button in the selected goal          Diabetes   Recent Relevant Labs: Lab Results  Component Value Date/Time   HGBA1C 6.7 (A) 12/25/2019 10:16 AM   HGBA1C 6.3 (A) 06/19/2019 10:31 AM   HGBA1C 6.3 (H) 12/08/2018 11:23 AM   HGBA1C 6.5 (H) 08/02/2018 08:52 AM   MICROALBUR 176.3 12/08/2018 11:23 AM   MICROALBUR 21.2 12/10/2016 11:43 AM    Kidney Function Lab Results  Component Value Date/Time   CREATININE 1.71 (H) 04/17/2019 08:55 AM   CREATININE 1.64 (H) 02/19/2019 09:29 AM   GFRNONAA 38 (L) 04/17/2019 08:55 AM   GFRAA 44 (L) 04/17/2019 08:55 AM     Checking BG: Daily in the morning.  Recent FBG Readings: 110-120  Patient has failed these meds in past: Januvia (cost), Tradjenta (cost), metformin (kidney fxn) Patient is currently controlled on the following medications:  Glipizide XL 2.5mg  daily  Last diabetic Foot exam:  Lab Results  Component Value Date/Time   HMDIABEYEEXA No Retinopathy 10/24/2018 12:00 AM      We discussed:   A1c still controlled, however is trending up since last time it was checked in May He stopped Januvia due to cost concerns, patient and wife on SSI only so he would qualify for PAP program most likely for Januvia cost assistance. He has not started monitoring his blood sugar at any other time than the morning yet, he plans to start doing so as of today. Denies any hypoglycemia Monitoring plan initiated  Plan  Continue current medications  Check blood glucose at various times per day Follow up in 4 weeks, will use Januvia with patient assistance program if needed Hypertension    Office blood pressures are  BP Readings from Last 3 Encounters:  12/25/19 120/72  10/09/19 120/70  10/08/19 136/72    Patient has failed these meds in the past: none noted  Patient checks BP at home daily  Patient home BP readings are ranging: recent readings are 170-180/70s  Patient is currently controlled on the following medications:  amlodipine 10mg  daily atenolol 50 mg  daily valsartan 320 mg daily  We discussed: Patient still monitoring as previous Reports some elevated readings in the 170s-180s systolic which is usually in the mornings, then it does down after he takes his medications Recommended he switch Valsartan to PM dosing to see if this helps morning readings Monitoring plan initiated Counseled him if consistently >130/90 to contact providers.   Plan  Continue current medications.   PM dosing on valsartan F/u on BP readings with CPA   Hyperlipidemia   Lipid Panel     Component Value Date/Time   CHOL 231 (H) 07/26/2019 0736   TRIG 173 (H) 07/26/2019 0736   HDL 58 07/26/2019 0736   CHOLHDL 4.0 07/26/2019 0736   CHOLHDL 4.3 04/17/2019 0855   VLDL 19 04/08/2016 0828   LDLCALC 142 (H) 07/26/2019 0736   LDLCALC 141 (H) 04/17/2019 0855   LABVLDL 31 07/26/2019 0736     The 10-year ASCVD risk score Denman George DC Jr., et al., 2013) is: 35.2%   Values used to calculate the score:      Age: 52 years     Sex: Male     Is Non-Hispanic African American: Yes     Diabetic: Yes     Tobacco smoker: No     Systolic Blood Pressure: 120 mmHg     Is BP treated: Yes     HDL Cholesterol: 58 mg/dL     Total Cholesterol: 231 mg/dL   Patient has failed these meds in past: Patient is currently uncontrolled on the following medications: Rosuvastatin 20mg   We discussed: Patient now taking Crestor 20mg  daily He reports myalgias are not as severe on this as other statins he has taken previously Counseled on importance of medication adherence Also recommended patient make appointment for updated lipid panel  Plan  Continue current medications.  F/u on updated lipid panel in 30 days  Vaccines   Reviewed and discussed patient's vaccination history.    Immunization History  Administered Date(s) Administered   Influenza, High Dose Seasonal PF 11/24/2017   Influenza,inj,Quad PF,6+ Mos 10/22/2014, 10/29/2015   PFIZER SARS-COV-2 Vaccination 02/23/2019, 03/16/2019   Pneumococcal Conjugate-13 04/13/2016   Pneumococcal Polysaccharide-23 02/09/2008, 04/14/2018   Tdap 02/09/2008    Plan  Recommended patient receive Shingles vaccine in pharmacy.  Not interested at this time but will consider in the future.   Medication Management   Miscellaneous medications: allopurinol 100mg   OTC's: Vitamin D-3 2000 IU daily, ferrous sulfate 325mg  daily, MVI, Fish oil 1200mg  bid, potassium 99mg  [facility]  Patient has much cheaper copays using mail order than he did using [facility]. Patient reports using pill box method to organize medications and promote adherence. Patient denies missed doses of medication.   06/14/2018, PharmD Clinical Pharmacist 04/08/2008 Family Medicine [phone]   I have collaborated with the care management provider regarding care management and care coordination activities outlined in this encounter and have  reviewed this encounter including documentation in the note and care plan. I am certifying that I agree with the content of this note and encounter as supervising physician.

## 2020-01-18 NOTE — Chronic Care Management (AMB) (Signed)
Chronic Care Management Pharmacy  Name: Cordelle Dahmen  MRN: [id number] DOB: 05/27/1945  James Jones,  74 y.o. , male presents for their Follow-Up CCM visit with the clinical pharmacist In office.  PCP : Donita Brooks, MD  Their chronic conditions include: hypertension, CAD, Diabetes, dyslipidemia.  Office Visits:  04/17/2019 ([facility]) - statin was d/c previously d/t myalgias, but was restarted d/t LDL > 200.  LDL was still high after initiation of statin so fish oil was increased to 3000 mg daily.   03/08/2019 ([facility]) - gout in right knee, started prednisone taper pack  02/19/2019 ([facility]) - added atenolol 50mg  daily due to elevated BP,   Consult Visit:  12/25/2019 12/27/2019) - Patient had to dc Januvia due to cost, he is now on low dose sulfonylurea.  Also is taking rosuvastatin 20mg  due to more muscle pains on pravastatin.  No updated lipids since June.  06/19/2019 , Endocrinology) - continued all diabetic meds, may want to discuss switch back to rosuvastatin  Medications: Outpatient Encounter Medications as of 01/30/2020  Medication Sig   allopurinol (ZYLOPRIM) 100 MG tablet Take 1 tablet (100 mg total) by mouth daily.   amLODipine (NORVASC) 10 MG tablet Take 1 tablet (10 mg total) by mouth daily.   atenolol (TENORMIN) 50 MG tablet Take 1 tablet (50 mg total) by mouth daily.   Blood Glucose Monitoring Suppl (FREESTYLE LITE) DEVI Use to check blood sugar once daily   Cholecalciferol (VITAMIN D-3) 1000 UNITS CAPS Take by mouth daily. 2 capsules.   ferrous sulfate 325 (65 FE) MG tablet Take 1 tablet (325 mg total) by mouth 2 (two) times daily with a meal.   fish oil-omega-3 fatty acids 1000 MG capsule Take 1,200 mg by mouth daily.    glipiZIDE (GLUCOTROL XL) 2.5 MG 24 hr tablet Take 1 tablet (2.5 mg total) by mouth daily with breakfast.   glucose blood (FREESTYLE LITE) test strip USE TO CHECK BLOOD SUGAR ONCE DAILY. E11.59   Multiple Vitamin (MULTIVITAMIN  WITH MINERALS) TABS Take 1 tablet by mouth daily.   Potassium 99 MG TABS Take 99 mg by mouth daily.    predniSONE (DELTASONE) 20 MG tablet 3 tabs poqday 1-2, 2 tabs poqday 3-4, 1 tab poqday 5-6   PRESCRIPTION MEDICATION Uses C-PAP at bedtime   valsartan (DIOVAN) 320 MG tablet Take 1 tablet (320 mg total) by mouth daily.   furosemide (LASIX) 20 MG tablet Take 1 tablet (20 mg total) by mouth as needed. FOR LEG SWELLING   rosuvastatin (CRESTOR) 20 MG tablet Take 1 tablet (20 mg total) by mouth daily.   No facility-administered encounter medications on file as of 01/30/2020.     Current Diagnosis/Assessment:  01[birth year]13-1978: Low Risk    Difficulty of Paying Living Expenses: Not very hard     Goals Addressed            This Visit's Progress    CARE PLAN       CARE PLAN ENTRY  Current Barriers:   Chronic Disease Management support, education, and care coordination needs related to Hypertension, Hyperlipidemia, and Diabetes   Hypertension  Pharmacist Clinical Goal(s): o Over the next 60 days, patient will work with PharmD and providers to achieve BP goal <130/80  Current regimen:  o Amlodipine 10mg  daily, atenolol 50mg  daily, valsartan 320mg  daily  Interventions: o Continue current therapy o Recommend comparison of home monitor with office monitor o Recommend visit with PCP for BP eval in office o Monitor BP  daily  Patient self care activities - Over the next 60 days, patient will: o Check BP once daily, document, and provide at future appointments o Ensure daily salt intake < 2300 mg/day o Report consistent BP readings > 130/80 to PharmD or PCP.  Hyperlipidemia  Pharmacist Clinical Goal(s): o Over the next 60 days, patient will work with PharmD and providers to achieve LDL goal < 70.  Current regimen:  o Rosuvastatin 20mg  daily  Interventions: o Continue current therapy o RTC for lipid panel ASAP  Patient self care activities - Over the next  60 days, patient will: o Make appointment to have lipids rechecked to assess current medication effectiveness o Notify PharmD or PCP of any worsening muscle pain  Diabetes  Pharmacist Clinical Goal(s): o Over the next 30 days, patient will work with PharmD and providers to maintain A1c goal <7%  Current regimen:  o Glipizide XL 2.5mg  daily  Interventions: o Continue current therapy o Counseled on diet and importance of eating around all doses of medications that lower blood sugar. o Discussed availability of Januvia PAP  Patient self care activities - Over the next 30 days, patient will: o Check blood sugar in the morning before eating or drinking, document, and provide at future appointments o Contact provider with any episodes of hypoglycemia, or severely elevated blood sugars. o Keep blood sugar log and time of day checked  Please see past updates related to this goal by clicking on the "Past Updates" button in the selected goal         Diabetes   Recent Relevant Labs: Lab Results  Component Value Date/Time   HGBA1C 6.7 (A) 12/25/2019 10:16 AM   HGBA1C 6.3 (A) 06/19/2019 10:31 AM   HGBA1C 6.3 (H) 12/08/2018 11:23 AM   HGBA1C 6.5 (H) 08/02/2018 08:52 AM   MICROALBUR 176.3 12/08/2018 11:23 AM   MICROALBUR 21.2 12/10/2016 11:43 AM    Kidney Function Lab Results  Component Value Date/Time   CREATININE 1.71 (H) 04/17/2019 08:55 AM   CREATININE 1.64 (H) 02/19/2019 09:29 AM   GFRNONAA 38 (L) 04/17/2019 08:55 AM   GFRAA 44 (L) 04/17/2019 08:55 AM     Checking BG: Daily in the morning and starting to check other times randomly throughout the day.  Recent FBG Readings: 100-110 Post prandial - 198 only reading he provided was after a large breakfast   Patient has failed these meds in past: Januvia (cost), Tradjenta (cost), metformin (kidney fxn) Patient is currently controlled on the following medications:   Glipizide XL 2.5mg  daily  Last diabetic Foot exam:  Lab  Results  Component Value Date/Time   HMDIABEYEEXA No Retinopathy 10/24/2018 12:00 AM      We discussed:   A1c still controlled, however is trending up since last time it was checked in May  Majority of his blood sugar readings are at goal  He mentions occasional glucose readings in the 70s if he does not eat throughout the day.  Counseled that it is important to eat regular meals throughout the day especially being on glipizide and its ability to rapidly lower glucose.  He is very conscious of his health maintenance and is very diligent at home monitoring.  At the moment ok to leave him off Januvia,  Recommend another A1c in 4 months and if still trending up will look into patient assistance program to add it back and help with cost.  Plan  Continue current medications  Check blood glucose at various times per day  Contact providers with any episodes of hypoglycemia.  Hypertension    Office blood pressures are  BP Readings from Last 3 Encounters:  12/25/19 120/72  10/09/19 120/70  10/08/19 136/72    Patient has failed these meds in the past: none noted  Patient checks BP at home daily  Patient home BP readings are ranging: recent readings are 157/87, 174/86  Patient is currently controlled on the following medications:   amlodipine 10mg  daily  atenolol 50 mg daily  valsartan 320 mg daily  We discussed:  Monitors at home a few times per day  Reports readings still occasionally elevated > 150 systolic.  His office BP's have been very well controlled.  He did take my recommendation to switch his valsartan to PM dosing and stated it has had little if any effect on his readings.  Recommended he schedule appointment with PCP to have BP checked in office and get updated labwork.  He is agreeable to do this at the beginning of the year after the holidays.  Counseled to notify if BP consistently elevated.   Plan  Continue current medications.   F/u on BP  readings with CPA first of the year after PCP visit PharmD follow up in 60 days   Hyperlipidemia   Lipid Panel     Component Value Date/Time   CHOL 231 (H) 07/26/2019 0736   TRIG 173 (H) 07/26/2019 0736   HDL 58 07/26/2019 0736   CHOLHDL 4.0 07/26/2019 0736   CHOLHDL 4.3 04/17/2019 0855   VLDL 19 04/08/2016 0828   LDLCALC 142 (H) 07/26/2019 0736   LDLCALC 141 (H) 04/17/2019 0855   LABVLDL 31 07/26/2019 0736     The 10-year ASCVD risk score 07/28/2019 DC Jr., et al., 2013) is: 35.2%   Values used to calculate the score:     Age: 56 years     Sex: Male     Is Non-Hispanic African American: Yes     Diabetic: Yes     Tobacco smoker: No     Systolic Blood Pressure: 120 mmHg     Is BP treated: Yes     HDL Cholesterol: 58 mg/dL     Total Cholesterol: 231 mg/dL   Patient has failed these meds in past: Patient is currently uncontrolled on the following medications:  Rosuvastatin 20mg   We discussed:  Patient now taking Crestor 20mg  daily  He reports myalgias are not as severe on this as other statins he has taken previously  Counseled on importance of medication adherence  Also recommended patient make appointment for updated lipid panel first of the year  Plan  Continue current medications.    Vaccines   Reviewed and discussed patient's vaccination history.    Immunization History  Administered Date(s) Administered   Influenza, High Dose Seasonal PF 11/24/2017   Influenza,inj,Quad PF,6+ Mos 10/22/2014, 10/29/2015   PFIZER SARS-COV-2 Vaccination 02/23/2019, 03/16/2019   Pneumococcal Conjugate-13 04/13/2016   Pneumococcal Polysaccharide-23 02/09/2008, 04/14/2018   Tdap 02/09/2008    Plan  Patient reports he has received his booster vaccine for COVID-19.  Medication Management    Miscellaneous medications: allopurinol 100mg    OTC's: Vitamin D-3 2000 IU daily, ferrous sulfate 325mg  daily, MVI, Fish oil 1200mg  bid, potassium 99mg   [facility]  o Patient has much cheaper copays using mail order than he did using [facility].  Patient reports using pill box method to organize medications and promote adherence.  Patient denies missed doses of medication.  Willa Fraterhristian Timo Hartwig, PharmD Clinical Pharmacist [facility] [phone]

## 2020-01-30 ENCOUNTER — Ambulatory Visit: Payer: Medicare Other | Admitting: Pharmacist

## 2020-01-30 DIAGNOSIS — I10 Essential (primary) hypertension: Secondary | ICD-10-CM

## 2020-01-30 DIAGNOSIS — E1159 Type 2 diabetes mellitus with other circulatory complications: Secondary | ICD-10-CM

## 2020-01-30 DIAGNOSIS — E785 Hyperlipidemia, unspecified: Secondary | ICD-10-CM

## 2020-01-30 NOTE — Patient Instructions (Addendum)
Visit Information  Goals Addressed            This Visit's Progress   . CARE PLAN       CARE PLAN ENTRY  Current Barriers:  . Chronic Disease Management support, education, and care coordination needs related to Hypertension, Hyperlipidemia, and Diabetes   Hypertension . Pharmacist Clinical Goal(s): o Over the next 60 days, patient will work with PharmD and providers to achieve BP goal <130/80 . Current regimen:  o Amlodipine 10mg  daily, atenolol 50mg  daily, valsartan 320mg  daily . Interventions: o Continue current therapy o Recommend comparison of home monitor with office monitor o Recommend visit with PCP for BP eval in office o Monitor BP daily . Patient self care activities - Over the next 60 days, patient will: o Check BP once daily, document, and provide at future appointments o Ensure daily salt intake < 2300 mg/day o Report consistent BP readings > 130/80 to PharmD or PCP.  Hyperlipidemia . Pharmacist Clinical Goal(s): o Over the next 60 days, patient will work with PharmD and providers to achieve LDL goal < 70. . Current regimen:  o Rosuvastatin 20mg  daily . Interventions: o Continue current therapy o RTC for lipid panel ASAP . Patient self care activities - Over the next 60 days, patient will: o Make appointment to have lipids rechecked to assess current medication effectiveness o Notify PharmD or PCP of any worsening muscle pain  Diabetes . Pharmacist Clinical Goal(s): o Over the next 30 days, patient will work with PharmD and providers to maintain A1c goal <7% . Current regimen:  o Glipizide XL 2.5mg  daily . Interventions: o Continue current therapy o Counseled on diet and importance of eating around all doses of medications that lower blood sugar. o Discussed availability of Januvia PAP . Patient self care activities - Over the next 30 days, patient will: o Check blood sugar in the morning before eating or drinking, document, and provide at future  appointments o Contact provider with any episodes of hypoglycemia, or severely elevated blood sugars. o Keep blood sugar log and time of day checked  Please see past updates related to this goal by clicking on the "Past Updates" button in the selected goal         The patient verbalized understanding of instructions, educational materials, and care plan provided today and agreed to receive a mailed copy of patient instructions, educational materials, and care plan.   Telephone follow up appointment with pharmacy team member scheduled for: 60 days  , PharmD Clinical Pharmacist Family Medicine [phone]   Hypertension, Adult High blood pressure (hypertension) is when the force of blood pumping through the arteries is too strong. The arteries are the blood vessels that carry blood from the heart throughout the body. Hypertension forces the heart to work harder to pump blood and may cause arteries to become narrow or stiff. Untreated or uncontrolled hypertension can cause a heart attack, heart failure, a stroke, kidney disease, and other problems. A blood pressure reading consists of a higher number over a lower number. Ideally, your blood pressure should be below 120/80. The first ("top") number is called the systolic pressure. It is a measure of the pressure in your arteries as your heart beats. The second ("bottom") number is called the diastolic pressure. It is a measure of the pressure in your arteries as the heart relaxes. What are the causes? The exact cause of this condition is not known. There are some conditions that  result in or are related to high blood pressure. What increases the risk? Some risk factors for high blood pressure are under your control. The following factors may make you more likely to develop this condition:  Smoking.  Having type 2 diabetes mellitus, high cholesterol, or both.  Not getting enough exercise or physical  activity.  Being overweight.  Having too much fat, sugar, calories, or salt (sodium) in your diet.  Drinking too much alcohol. Some risk factors for high blood pressure may be difficult or impossible to change. Some of these factors include:  Having chronic kidney disease.  Having a family history of high blood pressure.  Age. Risk increases with age.  Race. You may be at higher risk if you are African American.  Gender. Men are at higher risk than women before age 73. After age 22, women are at higher risk than men.  Having obstructive sleep apnea.  Stress. What are the signs or symptoms? High blood pressure may not cause symptoms. Very high blood pressure (hypertensive crisis) may cause:  Headache.  Anxiety.  Shortness of breath.  Nosebleed.  Nausea and vomiting.  Vision changes.  Severe chest pain.  Seizures. How is this diagnosed? This condition is diagnosed by measuring your blood pressure while you are seated, with your arm resting on a flat surface, your legs uncrossed, and your feet flat on the floor. The cuff of the blood pressure monitor will be placed directly against the skin of your upper arm at the level of your heart. It should be measured at least twice using the same arm. Certain conditions can cause a difference in blood pressure between your right and left arms. Certain factors can cause blood pressure readings to be lower or higher than normal for a short period of time:  When your blood pressure is higher when you are in a health care provider's office than when you are at home, this is called white coat hypertension. Most people with this condition do not need medicines.  When your blood pressure is higher at home than when you are in a health care provider's office, this is called masked hypertension. Most people with this condition may need medicines to control blood pressure. If you have a high blood pressure reading during one visit or you have  normal blood pressure with other risk factors, you may be asked to:  Return on a different day to have your blood pressure checked again.  Monitor your blood pressure at home for 1 week or longer. If you are diagnosed with hypertension, you may have other blood or imaging tests to help your health care provider understand your overall risk for other conditions. How is this treated? This condition is treated by making healthy lifestyle changes, such as eating healthy foods, exercising more, and reducing your alcohol intake. Your health care provider may prescribe medicine if lifestyle changes are not enough to get your blood pressure under control, and if:  Your systolic blood pressure is above 130.  Your diastolic blood pressure is above 80. Your personal target blood pressure may vary depending on your medical conditions, your age, and other factors. Follow these instructions at home: Eating and drinking   Eat a diet that is high in fiber and potassium, and low in sodium, added sugar, and fat. An example eating plan is called the DASH (Dietary Approaches to Stop Hypertension) diet. To eat this way: ? Eat plenty of fresh fruits and vegetables. Try to fill one half of  your plate at each meal with fruits and vegetables. ? Eat whole grains, such as whole-wheat pasta, brown rice, or whole-grain bread. Fill about one fourth of your plate with whole grains. ? Eat or drink low-fat dairy products, such as skim milk or low-fat yogurt. ? Avoid fatty cuts of meat, processed or cured meats, and poultry with skin. Fill about one fourth of your plate with lean proteins, such as fish, chicken without skin, beans, eggs, or tofu. ? Avoid pre-made and processed foods. These tend to be higher in sodium, added sugar, and fat.  Reduce your daily sodium intake. Most people with hypertension should eat less than 1,500 mg of sodium a day.  Do not drink alcohol if: ? Your health care provider tells you not to  drink. ? You are pregnant, may be pregnant, or are planning to become pregnant.  If you drink alcohol: ? Limit how much you use to:  0-1 drink a day for women.  0-2 drinks a day for men. ? Be aware of how much alcohol is in your drink. In the [place] one drink equals one 12 oz bottle of beer (355 mL), one 5 oz glass of wine (148 mL), or one 1 oz glass of hard liquor (44 mL). Lifestyle   Work with your health care provider to maintain a healthy body weight or to lose weight. Ask what an ideal weight is for you.  Get at least 30 minutes of exercise most days of the week. Activities may include walking, swimming, or biking.  Include exercise to strengthen your muscles (resistance exercise), such as Pilates or lifting weights, as part of your weekly exercise routine. Try to do these types of exercises for 30 minutes at least 3 days a week.  Do not use any products that contain nicotine or tobacco, such as cigarettes, e-cigarettes, and chewing tobacco. If you need help quitting, ask your health care provider.  Monitor your blood pressure at home as told by your health care provider.  Keep all follow-up visits as told by your health care provider. This is important. Medicines  Take over-the-counter and prescription medicines only as told by your health care provider. Follow directions carefully. Blood pressure medicines must be taken as prescribed.  Do not skip doses of blood pressure medicine. Doing this puts you at risk for problems and can make the medicine less effective.  Ask your health care provider about side effects or reactions to medicines that you should watch for. Contact a health care provider if you:  Think you are having a reaction to a medicine you are taking.  Have headaches that keep coming back (recurring).  Feel dizzy.  Have swelling in your ankles.  Have trouble with your vision. Get help right away if you:  Develop a severe headache or confusion.  Have  unusual weakness or numbness.  Feel faint.  Have severe pain in your chest or abdomen.  Vomit repeatedly.  Have trouble breathing. Summary  Hypertension is when the force of blood pumping through your arteries is too strong. If this condition is not controlled, it may put you at risk for serious complications.  Your personal target blood pressure may vary depending on your medical conditions, your age, and other factors. For most people, a normal blood pressure is less than 120/80.  Hypertension is treated with lifestyle changes, medicines, or a combination of both. Lifestyle changes include losing weight, eating a healthy, low-sodium diet, exercising more, and limiting alcohol. This information is not  intended to replace advice given to you by your health care provider. Make sure you discuss any questions you have with your health care provider. Document Revised: 10/05/2017 Document Reviewed: 10/05/2017 Elsevier Patient Education  2020 [employer]

## 2020-02-07 ENCOUNTER — Telehealth: Payer: Self-pay | Admitting: Pharmacist

## 2020-02-07 NOTE — Progress Notes (Signed)
Verified Adherence Gap Information. Per insurance data, the patient met goals of keeping his A1C less than 9. Patient did perform his AWV this year 04/17/2019.Patients blood pressure stayed lower than 140/90. Patients most recent A1C was 6.3.  Charlann Lange, [place] Clinical Pharmacist Assistant [phone]

## 2020-03-28 NOTE — Progress Notes (Signed)
Chronic Care Management Pharmacy Note  04/04/2020 Name:  James Jones MRN:  [id number] DOB:  02-28-1945  Subjective: James Jones is an 75 y.o. year old male who is a primary patient of Pickard, Cammie Mcgee, MD.  The CCM team was consulted for assistance with disease management and care coordination needs.    Engaged with patient by telephone for follow up visit in response to provider referral for pharmacy case management and/or care coordination services.   Consent to Services:  The patient was given the following information about Chronic Care Management services today, agreed to services, and gave verbal consent: 1. CCM service includes personalized support from designated clinical staff supervised by the primary care provider, including individualized plan of care and coordination with other care providers 2. 24/7 contact phone numbers for assistance for urgent and routine care needs. 3. Service will only be billed when office clinical staff spend 20 minutes or more in a month to coordinate care. 4. Only one practitioner may furnish and bill the service in a calendar month. 5.The patient may stop CCM services at any time (effective at the end of the month) by phone call to the office staff. 6. The patient will be responsible for cost sharing (co-pay) of up to 20% of the service fee (after annual deductible is met). Patient agreed to services and consent obtained.  Patient Care Team: Susy Frizzle, MD as PCP - General (Family Medicine) Leonie Man, MD as PCP - Cardiology (Cardiology) Edythe Clarity, [facility] as Pharmacist (Pharmacist)  Recent office visits: 04/17/2019 Dennard Schaumann) - statin was d/c previously d/t myalgias, but was restarted d/t LDL > 200.  LDL was still high after initiation of statin so fish oil was increased to 3000 mg daily.   03/08/2019 (Pickard) - gout in right knee, started prednisone taper pack  02/19/2019 (Pickard) - added atenolol 64m daily due to elevated  BP  Recent consult visits: 12/25/2019 (Renne Crigler - Patient had to dc Januvia due to cost, he is now on low dose sulfonylurea.  Also is taking rosuvastatin 264mdue to more muscle pains on pravastatin.  No updated lipids since June.  06/19/2019 ([facility]) - continued all diabetic meds, may want to discuss switch back to rosuvastatin  Hospital visits: None in previous 6 months  Objective:  Lab Results  Component Value Date   CREATININE 1.71 (H) 04/17/2019   BUN 18 04/17/2019   GFRNONAA 38 (L) 04/17/2019   GFRAA 44 (L) 04/17/2019   NA 141 04/17/2019   K 4.0 04/17/2019   CALCIUM 9.5 04/17/2019   CO2 22 04/17/2019    Lab Results  Component Value Date/Time   HGBA1C 6.7 (A) 12/25/2019 10:16 AM   HGBA1C 6.3 (A) 06/19/2019 10:31 AM   HGBA1C 6.3 (H) 12/08/2018 11:23 AM   HGBA1C 6.5 (H) 08/02/2018 08:52 AM   MICROALBUR 176.3 12/08/2018 11:23 AM   MICROALBUR 21.2 12/10/2016 11:43 AM    Last diabetic Eye exam:  Lab Results  Component Value Date/Time   HMDIABEYEEXA No Retinopathy 10/24/2018 12:00 AM    Last diabetic Foot exam: No results found for: HMDIABFOOTEX   Lab Results  Component Value Date   CHOL 231 (H) 07/26/2019   HDL 58 07/26/2019   LDLCALC 142 (H) 07/26/2019   TRIG 173 (H) 07/26/2019   CHOLHDL 4.0 07/26/2019    Hepatic Function Latest Ref Rng & Units 07/26/2019 04/17/2019 02/19/2019  Total Protein 6.0 - 8.5 g/dL 6.9 6.4 6.9  Albumin 3.7 - 4.7 g/dL 4.4 - -  AST 0 - 40 IU/L 21 22 26   ALT 0 - 44 IU/L 16 15 20   Alk Phosphatase 48 - 121 IU/L 102 - -  Total Bilirubin 0.0 - 1.2 mg/dL 0.2 0.4 0.3  Bilirubin, Direct 0.00 - 0.40 mg/dL 0.08 - -    Lab Results  Component Value Date/Time   TSH 1.82 04/08/2016 08:28 AM   TSH 1.625 03/17/2012 03:10 PM    CBC Latest Ref Rng & Units 04/17/2019 02/19/2019 12/08/2018  WBC 3.8 - 10.8 Thousand/uL 8.9 8.5 8.7  Hemoglobin 13.2 - 17.1 g/dL 12.5(L) 13.9 12.1(L)  Hematocrit 38.5 - 50.0 % 37.9(L) 41.8 36.4(L)  Platelets  140 - 400 Thousand/uL 263 261 325    No results found for: VD25OH  Clinical ASCVD: No  The 10-year ASCVD risk score Mikey Bussing DC Jr., et al., 2013) is: 35.2%   Values used to calculate the score:     Age: 73 years     Sex: Male     Is Non-Hispanic African American: Yes     Diabetic: Yes     Tobacco smoker: No     Systolic Blood Pressure: 683 mmHg     Is BP treated: Yes     HDL Cholesterol: 58 mg/dL     Total Cholesterol: 231 mg/dL    Depression screen [facility] 2/9 04/17/2019 04/14/2018 05/18/2017  Decreased Interest 0 0 0  Down, Depressed, Hopeless 0 0 0  PHQ - 2 Score 0 0 0  Altered sleeping 0 - -  Tired, decreased energy 0 - -  Change in appetite 0 - -  Feeling bad or failure about yourself  0 - -  Trouble concentrating 0 - -  Moving slowly or fidgety/restless 0 - -  PHQ-9 Score 0 - -  Difficult doing work/chores Not difficult at all - -      Social History   Tobacco Use  Smoking Status Former Smoker  . Packs/day: 1.00  . Types: Cigarettes  . Quit date: 04/12/2012  . Years since quitting: 7.9  Smokeless Tobacco Never Used   BP Readings from Last 3 Encounters:  12/25/19 120/72  10/09/19 120/70  10/08/19 136/72   Pulse Readings from Last 3 Encounters:  12/25/19 (!) 55  10/09/19 62  10/08/19 60   Wt Readings from Last 3 Encounters:  12/25/19 212 lb 6.4 oz (96.3 kg)  10/09/19 209 lb (94.8 kg)  10/08/19 205 lb (93 kg)    Assessment/Interventions: Review of patient past medical history, allergies, medications, health status, including review of consultants reports, laboratory and other test data, was performed as part of comprehensive evaluation and provision of chronic care management services.   SDOH:  (Social Determinants of Health) assessments and interventions performed: No   CCM Care Plan  No Known Allergies  Medications Reviewed Today    Reviewed by Edythe Clarity, [facility] (Pharmacist) on 04/04/20 at 1121  Med List Status: <None>  Medication Order Taking? Sig  Documenting Provider Last Dose Status Informant  allopurinol (ZYLOPRIM) 100 MG tablet [id number] Yes Take 1 tablet (100 mg total) by mouth daily. Susy Frizzle, MD Taking Active   amLODipine (NORVASC) 10 MG tablet [id number] Yes Take 1 tablet (10 mg total) by mouth daily. Susy Frizzle, MD Taking Active   atenolol (TENORMIN) 50 MG tablet [id number] Yes Take 1 tablet (50 mg total) by mouth daily. Susy Frizzle, MD Taking Active   Blood Glucose Monitoring Suppl (FREESTYLE LITE) DEVI [id number] Yes Use to check blood sugar once daily  Philemon Kingdom, MD Taking Active   Cholecalciferol (VITAMIN D-3) 1000 UNITS CAPS [id number] Yes Take by mouth daily. 2 capsules. [provider] Taking Active Self           Med Note Amedeo Plenty, Charlena Cross D   Thu Jul 31, 2015  2:06 PM)    ferrous sulfate 325 (65 FE) MG tablet [id number] Yes Take 1 tablet (325 mg total) by mouth 2 (two) times daily with a meal. Susy Frizzle, MD Taking Active   fish oil-omega-3 fatty acids 1000 MG capsule [id number] Yes Take 1,200 mg by mouth daily.  [provider] Taking Active Self  furosemide (LASIX) 20 MG tablet [id number]  Take 1 tablet (20 mg total) by mouth as needed. FOR LEG SWELLING Troy Sine, MD  Expired 10/01/19 2359   glipiZIDE (GLUCOTROL XL) 2.5 MG 24 hr tablet [id number] Yes Take 1 tablet (2.5 mg total) by mouth daily with breakfast. Philemon Kingdom, MD Taking Active   glucose blood (FREESTYLE LITE) test strip [id number] Yes USE TO CHECK BLOOD SUGAR ONCE DAILY. E11.59 Philemon Kingdom, MD Taking Active   Multiple Vitamin (MULTIVITAMIN WITH MINERALS) TABS [id number] Yes Take 1 tablet by mouth daily. [provider] Taking Active Self  Potassium 99 MG TABS [id number] Yes Take 99 mg by mouth daily.  [provider] Taking Active Self  predniSONE (DELTASONE) 20 MG tablet [id number] Yes 3 tabs poqday 1-2, 2 tabs poqday 3-4, 1 tab poqday 5-6 Susy Frizzle, MD Taking Active   PRESCRIPTION  MEDICATION [id number] Yes Uses C-PAP at bedtime [provider] Taking Active Self  rosuvastatin (CRESTOR) 20 MG tablet [id number]  Take 1 tablet (20 mg total) by mouth daily. Leonie Man, MD  Expired 12/06/19 2359   valsartan (DIOVAN) 320 MG tablet [id number] Yes Take 1 tablet (320 mg total) by mouth daily. Leonie Man, MD Taking Active   Med List Note Raiford Simmonds, RN 10/16/13 1353): Patient is using C-PAP MACHINE          Patient Active Problem List   Diagnosis Date Noted  . Class 1 obesity with serious comorbidity and body mass index (BMI) of 30.0 to 30.9 in adult 02/13/2019  . Heartburn 03/24/2016  . OSA on CPAP 08/02/2015  . Controlled diabetes mellitus with circulatory complication, without long-term current use of insulin ([place]) 04/29/2014  . Acute blood loss anemia 02/13/2013  . Dyslipidemia, goal LDL below 70 10/15/2012  . Overweight (BMI 25.0-29.9) 10/02/2012  . OSA (obstructive sleep apnea), has been cpap intolerant 03/18/2012  . 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 CAD (coronary artery disease), with 60-70% stenosis in RCA 03/18/2012  . Essential hypertension 03/17/2012  . RBBB 03/17/2012  . 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 Influenza, High Dose Seasonal PF 11/24/2017  . Influenza,inj,Quad PF,6+ Mos 10/22/2014, 10/29/2015  . PFIZER(Purple Top)SARS-COV-2 Vaccination 02/23/2019, 03/16/2019  . Pneumococcal Conjugate-13 04/13/2016  . Pneumococcal Polysaccharide-23 02/09/2008, 04/14/2018  . Tdap 02/09/2008    Conditions to be addressed/monitored:  hypertension, CAD, Diabetes, dyslipidemia  Care Plan : General Pharmacy (Adult)  Updates made by Edythe Clarity, RPH since 04/04/2020 12:00 AM    Problem: hypertension, CAD, Diabetes, dyslipidemia   Priority: High  Onset Date: 04/04/2020    Long-Range Goal: Patient-Specific Goal    Start Date: 04/04/2020  Expected End Date: 10/02/2020  This Visit's Progress: On track  Priority: High  Note:  Current Barriers:  . Unable to achieve control of fasting blood sugars    Pharmacist Clinical Goal(s):  Marland Kitchen Over the next 90 days, patient will achieve adherence to monitoring guidelines and medication adherence to achieve therapeutic efficacy . achieve control of fasting blood glucose as evidenced by home readigns . contact provider office for questions/concerns as evidenced notation of same in electronic health record through collaboration with PharmD and provider.   Interventions: . 1:1 collaboration with Susy Frizzle, MD regarding development and update of comprehensive plan of care as evidenced by provider attestation and co-signature . Inter-disciplinary care team collaboration (see longitudinal plan of care) . Comprehensive medication review performed; medication list updated in electronic medical record  Hypertension (BP goal <130/80) -controlled -Current treatment:  amlodipine 77m daily  atenolol 50 mg daily  valsartan 320 mg daily -Medications previously tried: none noted  -Current home readings: no specific readings, patient checks and reports "normal" -Current dietary habits: see DM -[facility] -Denies hypotensive/hypertensive symptoms -Educated on BP goals and benefits of medications for prevention of heart attack, stroke and kidney damage; Exercise goal of 150 minutes per week; Importance of home blood pressure monitoring; -Counseled to monitor BP at home daily, document, and provide log at future appointments -Recommended to continue current medication  Hyperlipidemia/CAD: (LDL goal < 70) -uncontrolled -Current treatment:  Rosuvastatin 260m-Medications previously tried: none noted  -Current dietary patterns: see DM -Current exercise habits: [place] [facility] goals;   Benefits of statin for ASCVD risk reduction; -Need for updated lipid panel -Recommended to continue current medication, has not had lipid panel since he has been on this dose  Diabetes (A1c goal <7%) -controlled, based on last A1c -Current medications:  Glipizide XL 2.68m61maily -Medications previously tried: Januvia (cost), Tradjenta (cost), metformin (kidney fxn) -Current home glucose readings . fasting glucose: 150-160 . post prandial glucose:  -Denies hypoglycemic/hyperglycemic symptoms -Current meal patterns:  . breakfast: boiled eggs, apple, oatmeal, cereal . lunch: normally skips . dinner: chicken baked or steamed . snacks:  . drinks: does report sodas as his weakness -Current [facility] and blood sugar goals; Exercise goal of 150 minutes per week; Benefits of routine self-monitoring of blood sugar; Limitation of carbs and sodas -Counseled to check feet daily and get yearly eye exams -Recommended patient make appt for updated A1c If A1c still elevated, consider restart of Januvia if patient can handle the cost.  -Also recommend patient work to cut back on sodas.   Patient Goals/Self-Care Activities . Over the next 90 days, patient will:  - take medications as prescribed check glucose daily, document, and provide at future appointments engage in dietary modifications by reducing soda intake Create appointment for updated A1c and other labs  Follow Up Plan: The care management team will reach out to the patient again over the next 90 days.        Medication Assistance: None required.  Patient affirms current coverage meets needs.  Patient's [facility] pharmacy [facility]:  [id number] [place] [address]: [phone]: [phone] [place] [place] [place]: [phone]: [phone] pill box? Yes Pt endorses 100% compliance  We discussed: Benefits of medication synchronization, packaging  and delivery as well as enhanced pharmacist oversight with [employer]. Patient decided to: Continue current medication management strategy  Care Plan and Follow Up Patient Decision:  Patient agrees to Care Plan and Follow-up.  Plan: The care management team will reach out to the patient again over the next 90 days.  Beverly Milch, PharmD Clinical Pharmacist [place] [phone]

## 2020-04-04 ENCOUNTER — Ambulatory Visit (INDEPENDENT_AMBULATORY_CARE_PROVIDER_SITE_OTHER): Payer: Medicare Other | Admitting: Pharmacist

## 2020-04-04 DIAGNOSIS — E1159 Type 2 diabetes mellitus with other circulatory complications: Secondary | ICD-10-CM

## 2020-04-04 DIAGNOSIS — I10 Essential (primary) hypertension: Secondary | ICD-10-CM | POA: Diagnosis not present

## 2020-04-04 DIAGNOSIS — E785 Hyperlipidemia, unspecified: Secondary | ICD-10-CM | POA: Diagnosis not present

## 2020-04-04 NOTE — Patient Instructions (Addendum)
Visit Information  Goals Addressed            This Visit's Progress   . Monitor and Manage My Blood Sugar-Diabetes Type 2       Timeframe:  Long-Range Goal Priority:  High Start Date:  04/04/20                           Expected End Date: 10/02/20                    Follow Up Date 07/08/20   - check blood sugar at prescribed times - check blood sugar if I feel it is too high or too low - take the blood sugar log to all doctor visits    Why is this important?    Checking your blood sugar at home helps to keep it from getting very high or very low.   Writing the results in a diary or log helps the doctor know how to care for you.   Your blood sugar log should have the time, date and the results.   Also, write down the amount of insulin or other medicine that you take.   Other information, like what you ate, exercise done and how you were feeling, will also be helpful.     Notes: Try to work on cutting back on sodas and limiting carbohydrates.  Shoot for < 130 fasting glucose.      Patient Care Plan: General Pharmacy (Adult)    Problem Identified: hypertension, CAD, Diabetes, dyslipidemia   Priority: High  Onset Date: 04/04/2020    Long-Range Goal: Patient-Specific Goal   Start Date: 04/04/2020  Expected End Date: 10/02/2020  This Visit's Progress: On track  Priority: High  Note:   Current Barriers:  . Unable to achieve control of fasting blood sugars    Pharmacist Clinical Goal(s):  Marland Kitchen Over the next 90 days, patient will achieve adherence to monitoring guidelines and medication adherence to achieve therapeutic efficacy . achieve control of fasting blood glucose as evidenced by home readigns . contact provider office for questions/concerns as evidenced notation of same in electronic health record through collaboration with PharmD and provider.   Interventions: . 1:1 collaboration with Donita Brooks, MD regarding development and update of comprehensive plan of  care as evidenced by provider attestation and co-signature . Inter-disciplinary care team collaboration (see longitudinal plan of care) . Comprehensive medication review performed; medication list updated in electronic medical record  Hypertension (BP goal <130/80) -controlled -Current treatment:  amlodipine 10mg  daily  atenolol 50 mg daily  valsartan 320 mg daily -Medications previously tried: none noted  -Current home readings: no specific readings, patient checks and reports "normal" -Current dietary habits: see DM -Current exercise habits: goes to [facility] and walkes 3 times per week -Denies hypotensive/hypertensive symptoms -Educated on BP goals and benefits of medications for prevention of heart attack, stroke and kidney damage; Exercise goal of 150 minutes per week; Importance of home blood pressure monitoring; -Counseled to monitor BP at home daily, document, and provide log at future appointments -Recommended to continue current medication  Hyperlipidemia/CAD: (LDL goal < 70) -uncontrolled -Current treatment:  Rosuvastatin 20mg  -Medications previously tried: none noted  -Current dietary patterns: see DM -Current exercise habits: walks at [facility] -Educated on Cholesterol goals;  Benefits of statin for ASCVD risk reduction; -Need for updated lipid panel -Recommended to continue current medication, has not had lipid panel since he has been on this  dose  Diabetes (A1c goal <7%) -controlled, based on last A1c -Current medications:  Glipizide XL 2.5mg  daily -Medications previously tried: Januvia (cost), Tradjenta (cost), metformin (kidney fxn) -Current home glucose readings . fasting glucose: 150-160 . post prandial glucose:  -Denies hypoglycemic/hyperglycemic symptoms -Current meal patterns:  . breakfast: boiled eggs, apple, oatmeal, cereal . lunch: normally skips . dinner: chicken baked or steamed . snacks:  . drinks: does report sodas as his weakness -Current  exercise: [facility] -Educated onA1c and blood sugar goals; Exercise goal of 150 minutes per week; Benefits of routine self-monitoring of blood sugar; Limitation of carbs and sodas -Counseled to check feet daily and get yearly eye exams -Recommended patient make appt for updated A1c If A1c still elevated, consider restart of Januvia if patient can handle the cost.  -Also recommend patient work to cut back on sodas.   Patient Goals/Self-Care Activities . Over the next 90 days, patient will:  - take medications as prescribed check glucose daily, document, and provide at future appointments engage in dietary modifications by reducing soda intake Create appointment for updated A1c and other labs  Follow Up Plan: The care management team will reach out to the patient again over the next 90 days.        The patient verbalized understanding of instructions, educational materials, and care plan provided today and agreed to receive a mailed copy of patient instructions, educational materials, and care plan.  Telephone follow up appointment with pharmacy team member scheduled for: 3 months  James Jones, [facility]  Diabetes Mellitus and Nutrition, Adult When you have diabetes, or diabetes mellitus, it is very important to have healthy eating habits because your blood sugar (glucose) levels are greatly affected by what you eat and drink. Eating healthy foods in the right amounts, at about the same times every day, can help you:  Control your blood glucose.  Lower your risk of heart disease.  Improve your blood pressure.  Reach or maintain a healthy weight. What can affect my meal plan? Every person with diabetes is different, and each person has different needs for a meal plan. Your health care provider may recommend that you work with a dietitian to make a meal plan that is best for you. Your meal plan may vary depending on factors such as:  The calories you  need.  The medicines you take.  Your weight.  Your blood glucose, blood pressure, and cholesterol levels.  Your activity level.  Other health conditions you have, such as heart or kidney disease. How do carbohydrates affect me? Carbohydrates, also called carbs, affect your blood glucose level more than any other type of food. Eating carbs naturally raises the amount of glucose in your blood. Carb counting is a method for keeping track of how many carbs you eat. Counting carbs is important to keep your blood glucose at a healthy level, especially if you use insulin or take certain oral diabetes medicines. It is important to know how many carbs you can safely have in each meal. This is different for every person. Your dietitian can help you calculate how many carbs you should have at each meal and for each snack. How does alcohol affect me? Alcohol can cause a sudden decrease in blood glucose (hypoglycemia), especially if you use insulin or take certain oral diabetes medicines. Hypoglycemia can be a life-threatening condition. Symptoms of hypoglycemia, such as sleepiness, dizziness, and confusion, are similar to symptoms of having too much alcohol.  Do not drink alcohol if: ? Your health care provider tells you not to drink. ? You are pregnant, may be pregnant, or are planning to become pregnant.  If you drink alcohol: ? Do not drink on an empty stomach. ? Limit how much you use to:  0-1 drink a day for women.  0-2 drinks a day for men. ? Be aware of how much alcohol is in your drink. In the [place] one drink equals one 12 oz bottle of beer (355 mL), one 5 oz glass of wine (148 mL), or one 1 oz glass of hard liquor (44 mL). ? Keep yourself hydrated with water, diet soda, or unsweetened iced tea.  Keep in mind that regular soda, juice, and other mixers may contain a lot of sugar and must be counted as carbs. What are tips for following this plan? Reading food labels  Start by checking  the serving size on the "Nutrition Facts" label of packaged foods and drinks. The amount of calories, carbs, fats, and other nutrients listed on the label is based on one serving of the item. Many items contain more than one serving per package.  Check the total grams (g) of carbs in one serving. You can calculate the number of servings of carbs in one serving by dividing the total carbs by 15. For example, if a food has 30 g of total carbs per serving, it would be equal to 2 servings of carbs.  Check the number of grams (g) of saturated fats and trans fats in one serving. Choose foods that have a low amount or none of these fats.  Check the number of milligrams (mg) of salt (sodium) in one serving. Most people should limit total sodium intake to less than 2,300 mg per day.  Always check the nutrition information of foods labeled as "low-fat" or "nonfat." These foods may be higher in added sugar or refined carbs and should be avoided.  Talk to your dietitian to identify your daily goals for nutrients listed on the label. Shopping  Avoid buying canned, pre-made, or processed foods. These foods tend to be high in fat, sodium, and added sugar.  Shop around the outside edge of the grocery store. This is where you will most often find fresh fruits and vegetables, bulk grains, fresh meats, and fresh dairy. Cooking  Use low-heat cooking methods, such as baking, instead of high-heat cooking methods like deep frying.  Cook using healthy oils, such as olive, canola, or sunflower oil.  Avoid cooking with butter, cream, or high-fat meats. Meal planning  Eat meals and snacks regularly, preferably at the same times every day. Avoid going long periods of time without eating.  Eat foods that are high in fiber, such as fresh fruits, vegetables, beans, and whole grains. Talk with your dietitian about how many servings of carbs you can eat at each meal.  Eat 4-6 oz (112-168 g) of lean protein each day,  such as lean meat, chicken, fish, eggs, or tofu. One ounce (oz) of lean protein is equal to: ? 1 oz (28 g) of meat, chicken, or fish. ? 1 egg. ?  cup (62 g) of tofu.  Eat some foods each day that contain healthy fats, such as avocado, nuts, seeds, and fish.   What foods should I eat? Fruits Berries. Apples. Oranges. Peaches. Apricots. Plums. Grapes. Mango. Papaya. Pomegranate. Kiwi. Cherries. Vegetables Lettuce. Spinach. Leafy greens, including kale, chard, collard greens, and mustard greens. Beets. Cauliflower. Cabbage. Broccoli. Carrots. Green beans.  Tomatoes. Peppers. Onions. Cucumbers. Brussels sprouts. Grains Whole grains, such as whole-wheat or whole-grain bread, crackers, tortillas, cereal, and pasta. Unsweetened oatmeal. Quinoa. Brown or wild rice. Meats and other proteins Seafood. Poultry without skin. Lean cuts of poultry and beef. Tofu. Nuts. Seeds. Dairy Low-fat or fat-free dairy products such as milk, yogurt, and cheese. The items listed above may not be a complete list of foods and beverages you can eat. Contact a dietitian for more information. What foods should I avoid? Fruits Fruits canned with syrup. Vegetables Canned vegetables. Frozen vegetables with butter or cream sauce. Grains Refined white flour and flour products such as bread, pasta, snack foods, and cereals. Avoid all processed foods. Meats and other proteins Fatty cuts of meat. Poultry with skin. Breaded or fried meats. Processed meat. Avoid saturated fats. Dairy Full-fat yogurt, cheese, or milk. Beverages Sweetened drinks, such as soda or iced tea. The items listed above may not be a complete list of foods and beverages you should avoid. Contact a dietitian for more information. Questions to ask a health care provider  Do I need to meet with a diabetes educator?  Do I need to meet with a dietitian?  What number can I call if I have questions?  When are the best times to check my blood  glucose? Where to find more information:  American Diabetes Association: diabetes.org  Academy of Nutrition and Dietetics: www.eatright.[employer] of Diabetes and Digestive and Kidney Diseases: CarFlippers.tn  Association of Diabetes Care and Education Specialists: www.diabeteseducator.org Summary  It is important to have healthy eating habits because your blood sugar (glucose) levels are greatly affected by what you eat and drink.  A healthy meal plan will help you control your blood glucose and maintain a healthy lifestyle.  Your health care provider may recommend that you work with a dietitian to make a meal plan that is best for you.  Keep in mind that carbohydrates (carbs) and alcohol have immediate effects on your blood glucose levels. It is important to count carbs and to use alcohol carefully. This information is not intended to replace advice given to you by your health care provider. Make sure you discuss any questions you have with your health care provider. Document Revised: 01/02/2019 Document Reviewed: 01/02/2019 Elsevier Patient Education  2021 [employer].

## 2020-04-07 ENCOUNTER — Telehealth: Payer: Self-pay | Admitting: Family Medicine

## 2020-04-07 NOTE — Telephone Encounter (Signed)
Pt came into office needing a refill of these meds:  valsartan (DIOVAN) 320 MG tablet rosuvastatin (CRESTOR) 20 MG tablet  amLODipine (NORVASC) 10 MG tablet  sent to [facility] on [address]  Cb#: [phone]

## 2020-04-08 MED ORDER — AMLODIPINE BESYLATE 10 MG PO TABS: 10.0000 mg | ORAL_TABLET | Freq: Every day | ORAL | 3 refills | Status: DC

## 2020-04-08 MED ORDER — VALSARTAN 320 MG PO TABS: 320.0000 mg | ORAL_TABLET | Freq: Every day | ORAL | 0 refills | Status: DC

## 2020-04-08 MED ORDER — ROSUVASTATIN CALCIUM 20 MG PO TABS: 20.0000 mg | ORAL_TABLET | Freq: Every day | ORAL | 2 refills | Status: DC

## 2020-04-08 NOTE — Telephone Encounter (Signed)
Prescription sent to pharmacy.

## 2020-04-11 ENCOUNTER — Other Ambulatory Visit: Payer: Medicare Other

## 2020-04-11 ENCOUNTER — Other Ambulatory Visit: Payer: Self-pay

## 2020-04-11 DIAGNOSIS — I10 Essential (primary) hypertension: Secondary | ICD-10-CM

## 2020-04-11 DIAGNOSIS — E1159 Type 2 diabetes mellitus with other circulatory complications: Secondary | ICD-10-CM

## 2020-04-11 DIAGNOSIS — Z1159 Encounter for screening for other viral diseases: Secondary | ICD-10-CM

## 2020-04-11 DIAGNOSIS — E1122 Type 2 diabetes mellitus with diabetic chronic kidney disease: Secondary | ICD-10-CM | POA: Diagnosis not present

## 2020-04-11 DIAGNOSIS — N183 Chronic kidney disease, stage 3 unspecified: Secondary | ICD-10-CM | POA: Diagnosis not present

## 2020-04-11 DIAGNOSIS — E119 Type 2 diabetes mellitus without complications: Secondary | ICD-10-CM

## 2020-04-11 DIAGNOSIS — E785 Hyperlipidemia, unspecified: Secondary | ICD-10-CM | POA: Diagnosis not present

## 2020-04-11 DIAGNOSIS — I2510 Atherosclerotic heart disease of native coronary artery without angina pectoris: Secondary | ICD-10-CM

## 2020-04-14 LAB — COMPLETE METABOLIC PANEL WITH GFR
AG Ratio: 1.8 (calc) (ref 1.0–2.5)
ALT: 20 U/L (ref 9–46)
AST: 31 U/L (ref 10–35)
Albumin: 4.2 g/dL (ref 3.6–5.1)
Alkaline phosphatase (APISO): 94 U/L (ref 35–144)
BUN/Creatinine Ratio: 13 (calc) (ref 6–22)
BUN: 23 mg/dL (ref 7–25)
CO2: 19 mmol/L — ABNORMAL LOW (ref 20–32)
Calcium: 9.9 mg/dL (ref 8.6–10.3)
Chloride: 106 mmol/L (ref 98–110)
Creat: 1.76 mg/dL — ABNORMAL HIGH (ref 0.70–1.18)
GFR, Est African American: 43 mL/min/{1.73_m2} — ABNORMAL LOW (ref >=60)
GFR, Est Non African American: 37 mL/min/{1.73_m2} — ABNORMAL LOW (ref >=60)
Globulin: 2.3 g/dL (calc) (ref 1.9–3.7)
Glucose, Bld: 109 mg/dL — ABNORMAL HIGH (ref 65–99)
Potassium: 4.2 mmol/L (ref 3.5–5.3)
Sodium: 138 mmol/L (ref 135–146)
Total Bilirubin: 0.3 mg/dL (ref 0.2–1.2)
Total Protein: 6.5 g/dL (ref 6.1–8.1)

## 2020-04-14 LAB — LIPID PANEL
Cholesterol: 167 mg/dL (ref <200)
HDL: 54 mg/dL (ref >=40)
LDL Cholesterol (Calc): 92 mg/dL (calc)
Non-HDL Cholesterol (Calc): 113 mg/dL (calc) (ref <130)
Total CHOL/HDL Ratio: 3.1 (calc) (ref <5.0)
Triglycerides: 118 mg/dL (ref <150)

## 2020-04-14 LAB — CBC WITH DIFFERENTIAL/PLATELET
Absolute Monocytes: 447 cells/uL (ref 200–950)
Basophils Absolute: 62 cells/uL (ref 0–200)
Basophils Relative: 0.8 %
Eosinophils Absolute: 108 cells/uL (ref 15–500)
Eosinophils Relative: 1.4 %
HCT: 45.8 % (ref 38.5–50.0)
Hemoglobin: 14.8 g/dL (ref 13.2–17.1)
Lymphs Abs: 1432 cells/uL (ref 850–3900)
MCH: 27.7 pg (ref 27.0–33.0)
MCHC: 32.3 g/dL (ref 32.0–36.0)
MCV: 85.6 fL (ref 80.0–100.0)
MPV: 9.7 fL (ref 7.5–12.5)
Monocytes Relative: 5.8 %
Neutro Abs: 5652 cells/uL (ref 1500–7800)
Neutrophils Relative %: 73.4 %
Platelets: 304 10*3/uL (ref 140–400)
RBC: 5.35 10*6/uL (ref 4.20–5.80)
RDW: 13.2 % (ref 11.0–15.0)
Total Lymphocyte: 18.6 %
WBC: 7.7 10*3/uL (ref 3.8–10.8)

## 2020-04-14 LAB — HEMOGLOBIN A1C
Hgb A1c MFr Bld: 7 % of total Hgb — ABNORMAL HIGH (ref <5.7)
Mean Plasma Glucose: 154 mg/dL
eAG (mmol/L): 8.5 mmol/L

## 2020-04-14 LAB — HEPATITIS C ANTIBODY
Hepatitis C Ab: NONREACTIVE
SIGNAL TO CUT-OFF: 0.01 (ref <1.00)

## 2020-04-14 LAB — MICROALBUMIN / CREATININE URINE RATIO
Creatinine, Urine: 53 mg/dL (ref 20–320)
Microalb Creat Ratio: 1885 mcg/mg creat — ABNORMAL HIGH (ref <30)
Microalb, Ur: 99.9 mg/dL

## 2020-04-15 ENCOUNTER — Ambulatory Visit (INDEPENDENT_AMBULATORY_CARE_PROVIDER_SITE_OTHER): Payer: Medicare Other | Admitting: Family Medicine

## 2020-04-15 ENCOUNTER — Other Ambulatory Visit: Payer: Self-pay

## 2020-04-15 ENCOUNTER — Telehealth: Payer: Self-pay | Admitting: Family Medicine

## 2020-04-15 ENCOUNTER — Encounter: Payer: Self-pay | Admitting: Family Medicine

## 2020-04-15 VITALS — BP 162/70 | HR 64 | Temp 98.0°F | Ht 68.0 in | Wt 210.0 lb

## 2020-04-15 DIAGNOSIS — I10 Essential (primary) hypertension: Secondary | ICD-10-CM | POA: Diagnosis not present

## 2020-04-15 DIAGNOSIS — E1122 Type 2 diabetes mellitus with diabetic chronic kidney disease: Secondary | ICD-10-CM

## 2020-04-15 DIAGNOSIS — N183 Chronic kidney disease, stage 3 unspecified: Secondary | ICD-10-CM

## 2020-04-15 DIAGNOSIS — I2510 Atherosclerotic heart disease of native coronary artery without angina pectoris: Secondary | ICD-10-CM

## 2020-04-15 DIAGNOSIS — Z0001 Encounter for general adult medical examination with abnormal findings: Secondary | ICD-10-CM | POA: Diagnosis not present

## 2020-04-15 DIAGNOSIS — E1159 Type 2 diabetes mellitus with other circulatory complications: Secondary | ICD-10-CM | POA: Diagnosis not present

## 2020-04-15 DIAGNOSIS — Z0000 Encounter for general adult medical examination without abnormal findings: Secondary | ICD-10-CM

## 2020-04-15 MED ORDER — DAPAGLIFLOZIN PROPANEDIOL 10 MG PO TABS: 10.0000 mg | ORAL_TABLET | Freq: Every day | ORAL | 5 refills | Status: DC

## 2020-04-15 MED ORDER — DOXAZOSIN MESYLATE 2 MG PO TABS: 2.0000 mg | ORAL_TABLET | Freq: Every day | ORAL | 11 refills | Status: DC

## 2020-04-15 NOTE — Telephone Encounter (Signed)
Patient called in after he went to the pharmacy to pick up dapagliflozin propanediol (FARXIGA) 10 MG TABS tablet He states that it is over $500 he needs something else called in.   CB# [phone]

## 2020-04-15 NOTE — Progress Notes (Signed)
Subjective:    Patient ID: James Jones, male    DOB: [birth year], 75 y.o.   MRN: [id number]  HPI Patient is a very pleasant 75 year old African-American gentleman who presents today for complete physical exam.  His most recent lab work is listed below: Lab on 04/11/2020  Component Date Value Ref Range Status  . WBC 04/11/2020 7.7  3.8 - 10.8 Thousand/uL Final  . RBC 04/11/2020 5.35  4.20 - 5.80 Million/uL Final  . Hemoglobin 04/11/2020 14.8  13.2 - 17.1 g/dL Final  . HCT 97/67/3419 45.8  38.5 - 50.0 % Final  . MCV 04/11/2020 85.6  80.0 - 100.0 fL Final  . MCH 04/11/2020 27.7  27.0 - 33.0 pg Final  . MCHC 04/11/2020 32.3  32.0 - 36.0 g/dL Final  . RDW 37/90/2409 13.2  11.0 - 15.0 % Final  . Platelets 04/11/2020 304  140 - 400 Thousand/uL Final  . MPV 04/11/2020 9.7  7.5 - 12.5 fL Final  . Neutro Abs 04/11/2020 5,652  1,500 - 7,800 cells/uL Final  . Lymphs Abs 04/11/2020 1,432  850 - 3,900 cells/uL Final  . Absolute Monocytes 04/11/2020 447  200 - 950 cells/uL Final  . Eosinophils Absolute 04/11/2020 108  15 - 500 cells/uL Final  . Basophils Absolute 04/11/2020 62  0 - 200 cells/uL Final  . Neutrophils Relative % 04/11/2020 73.4  % Final  . Total Lymphocyte 04/11/2020 18.6  % Final  . Monocytes Relative 04/11/2020 5.8  % Final  . Eosinophils Relative 04/11/2020 1.4  % Final  . Basophils Relative 04/11/2020 0.8  % Final  . Glucose, Bld 04/11/2020 109* 65 - 99 mg/dL Final   Comment: .            Fasting reference interval . For someone without known diabetes, a glucose value between 100 and 125 mg/dL is consistent with prediabetes and should be confirmed with a follow-up test. .   . BUN 04/11/2020 23  7 - 25 mg/dL Final  . Creat 73/53/2992 1.76* 0.70 - 1.18 mg/dL Final   Comment: For patients >88 years of age, the reference limit for Creatinine is approximately 13% higher for people identified as African-American. .   . GFR, Est Non African American 04/11/2020 37* > OR =  60 mL/min/1.29m2 Final  . GFR, Est African American 04/11/2020 43* > OR = 60 mL/min/1.71m2 Final  . BUN/Creatinine Ratio 04/11/2020 13  6 - 22 (calc) Final  . Sodium 04/11/2020 138  135 - 146 mmol/L Final  . Potassium 04/11/2020 4.2  3.5 - 5.3 mmol/L Final  . Chloride 04/11/2020 106  98 - 110 mmol/L Final  . CO2 04/11/2020 19* 20 - 32 mmol/L Final  . Calcium 04/11/2020 9.9  8.6 - 10.3 mg/dL Final  . Total Protein 04/11/2020 6.5  6.1 - 8.1 g/dL Final  . Albumin 42/68/3419 4.2  3.6 - 5.1 g/dL Final  . Globulin 62/22/9798 2.3  1.9 - 3.7 g/dL (calc) Final  . AG Ratio 04/11/2020 1.8  1.0 - 2.5 (calc) Final  . Total Bilirubin 04/11/2020 0.3  0.2 - 1.2 mg/dL Final  . Alkaline phosphatase (APISO) 04/11/2020 94  35 - 144 U/L Final  . AST 04/11/2020 31  10 - 35 U/L Final  . ALT 04/11/2020 20  9 - 46 U/L Final  . Hgb A1c MFr Bld 04/11/2020 7.0* <5.7 % of total Hgb Final   Comment: For someone without known diabetes, a hemoglobin A1c value of 6.5% or greater indicates that they may  have  diabetes and this should be confirmed with a follow-up  test. . For someone with known diabetes, a value <7% indicates  that their diabetes is well controlled and a value  greater than or equal to 7% indicates suboptimal  control. A1c targets should be individualized based on  duration of diabetes, age, comorbid conditions, and  other considerations. . Currently, no consensus exists regarding use of hemoglobin A1c for diagnosis of diabetes for children. .   . Mean Plasma Glucose 04/11/2020 154  mg/dL Final  . eAG (mmol/L) 78/29/5621 8.5  mmol/L Final  . Hepatitis C Ab 04/11/2020 NON-REACTIVE  NON-REACTI Final  . SIGNAL TO CUT-OFF 04/11/2020 0.01  <1.00 Final   Comment: . HCV antibody was non-reactive. There is no laboratory  evidence of HCV infection. . In most cases, no further action is required. However, if recent HCV exposure is suspected, a test for HCV RNA (test code 30865) is suggested. . For  additional information please refer to http://education.[facility].com/faq/FAQ22v1 (This link is being provided for informational/ educational purposes only.) .   Marland Kitchen Cholesterol 04/11/2020 167  <200 mg/dL Final  . HDL 78/46/9629 54  > OR = 40 mg/dL Final  . Triglycerides 04/11/2020 118  <150 mg/dL Final  . LDL Cholesterol (Calc) 04/11/2020 92  mg/dL (calc) Final   Comment: Reference range: <100 . Desirable range <100 mg/dL for primary prevention;   <70 mg/dL for patients with CHD or diabetic patients  with > or = 2 CHD risk factors. Marland Kitchen LDL-C is now calculated using the Martin-Hopkins  calculation, which is a validated novel method providing  better accuracy than the Friedewald equation in the  estimation of LDL-C.  Horald Pollen et al. Lenox Ahr. 5284;132(44): 2061-2068  (http://education.[facility].com/faq/FAQ164)   . Total CHOL/HDL Ratio 04/11/2020 3.1  <0.1 (calc) Final  . Non-HDL Cholesterol (Calc) 04/11/2020 113  <130 mg/dL (calc) Final   Comment: For patients with diabetes plus 1 major ASCVD risk  factor, treating to a non-HDL-C goal of <100 mg/dL  (LDL-C of <02 mg/dL) is considered a therapeutic  option.   . Creatinine, Urine 04/11/2020 53  20 - 320 mg/dL Final  . Microalb, Ur 72/53/6644 99.9  mg/dL Final   Comment: Verified by repeat analysis. Marland Kitchen Reference Range Not established   . Microalb Creat Ratio 04/11/2020 1,885* <30 mcg/mg creat Final   Comment: . The ADA defines abnormalities in albumin excretion as follows: Marland Kitchen Albuminuria Category        Result (mcg/mg creatinine) . Normal to Mildly increased   <30 Moderately increased         30-299  Severely increased           > OR = 300 . The ADA recommends that at least two of three specimens collected within a 3-6 month period be abnormal before considering a patient to be within a diagnostic category.    What concerns me most on his lab work is his chronic kidney disease as well as his elevated albumin to  creatinine ratio of 1800!Marland Kitchen  He is already on maximum dose valsartan.  Hemoglobin A1c was 7.0.  Therefore we discussed other options we have to control diabetic nephropathy.  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  I rechecked his blood pressure today and found to be 162/70.  Patient has been checking his blood pressure at home and consistently seeing systolic blood pressures in the 1 60-1 70 range.  I believe the combination of  diabetes as well as hypertension is likely causing nephropathy and elevated proteinuria.  Colonoscopy is not due.  He will be due for a PSA after March 9.  We have elected to check this when he follows up in 6 months.  I reviewed his immunization records below.  He is due for the shingles vaccine.  He has had a booster on his COVID shot. Immunization History  Administered Date(s) Administered  . Influenza, High Dose Seasonal PF 11/24/2017  . Influenza,inj,Quad PF,6+ Mos 10/22/2014, 10/29/2015  . PFIZER(Purple Top)SARS-COV-2 Vaccination 02/23/2019, 03/16/2019  . Pneumococcal Conjugate-13 04/13/2016  . Pneumococcal Polysaccharide-23 02/09/2008, 04/14/2018  . Tdap 02/09/2008    Past Medical History:  Diagnosis Date  . 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 Coronary artery disease, non-occlusive 03/18/2012   60-70% stenosis in RCA -- FFR 0.84; EF 60-65%  . Diabetes mellitus without complication (HCC)   . GERD (gastroesophageal reflux disease)    On PPI  . History of GI bleed January 2015   No obvious findings on EGD/colonoscopy  . Hydrocele, bilateral    large on US 2020  . Hyperlipidemia   . Hypertension   . OSA (obstructive sleep apnea) 03/18/2012   Doing better with CPAP  . Pulmonary hypertension (HCC) 12/10/2010   ECHO:  Mild PH,mild LVH; PA pressures estimated 30-40 mmHg  . 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   Event monitor; usually during sleeping hours;  therefore not on beta blocker   Past Surgical History:  Procedure Laterality Date  . APPENDECTOMY  1974  . CARPAL TUNNEL RELEASE    . EYE SURGERY  1960   Left eye  . HEMORRHOID SURGERY    . LEFT HEART CATH AND CORONARY ANGIOGRAPHY  03/27/2010   Moderate mid RCA lesion,right radial approach,normal EF  . LEFT HEART CATHETERIZATION WITH CORONARY ANGIOGRAM N/A 03/17/2012   Procedure: LEFT HEART CATHETERIZATION WITH CORONARY ANGIOGRAM;  Surgeon: Marykay Lexavid W Harding, MD;  [facility];  Service: Cardiovascular: Fractional Flow Reserve Measurement of mid RCA 60-70% stenosis = 0.84. Otherwise mild LCA CAD.  Normal EF & EDP  . SHOULDER ARTHROSCOPY  2006  . TRANSTHORACIC ECHOCARDIOGRAM  12/2010    Mild concentric LVH.  EF> 55%.  GR 1 DD.  Mild aortic sclerosis no stenosis.  PA pressures estimated 30-40 mmHg   Current Outpatient Medications on File Prior to Visit  Medication Sig Dispense Refill  . allopurinol (ZYLOPRIM) 100 MG tablet Take 1 tablet (100 mg total) by mouth daily. 90 tablet 2  . amLODipine (NORVASC) 10 MG tablet Take 1 tablet (10 mg total) by mouth daily. 90 tablet 3  . atenolol (TENORMIN) 50 MG tablet Take 1 tablet (50 mg total) by mouth daily. 90 tablet 3  . Blood Glucose Monitoring Suppl (FREESTYLE LITE) DEVI Use to check blood sugar once daily 1 each 0  . Cholecalciferol (VITAMIN D-3) 1000 UNITS CAPS Take by mouth daily. 2 capsules.    . ferrous sulfate 325 (65 FE) MG tablet Take 1 tablet (325 mg total) by mouth 2 (two) times daily with a meal. 180 tablet 3  . fish oil-omega-3 fatty acids 1000 MG capsule Take 1,200 mg by mouth daily.     Marland Kitchen. glipiZIDE (GLUCOTROL XL) 2.5 MG 24 hr tablet Take 1 tablet (2.5 mg total) by mouth daily with breakfast. 90 tablet 1  . glucose blood (FREESTYLE LITE) test strip USE TO CHECK BLOOD SUGAR ONCE DAILY. E11.59 100 strip 12  . Multiple  Vitamin (MULTIVITAMIN WITH MINERALS) TABS Take 1 tablet by mouth daily.    . Potassium 99 MG TABS Take 99 mg by  mouth daily.     . predniSONE (DELTASONE) 20 MG tablet 3 tabs poqday 1-2, 2 tabs poqday 3-4, 1 tab poqday 5-6 12 tablet 0  . PRESCRIPTION MEDICATION Uses C-PAP at bedtime    . rosuvastatin (CRESTOR) 20 MG tablet Take 1 tablet (20 mg total) by mouth daily. 90 tablet 2  . valsartan (DIOVAN) 320 MG tablet Take 1 tablet (320 mg total) by mouth daily. 90 tablet 0  . furosemide (LASIX) 20 MG tablet Take 1 tablet (20 mg total) by mouth as needed. FOR LEG SWELLING 90 tablet 3   No current facility-administered medications on file prior to visit.   No Known Allergies Social History   Socioeconomic History  . Marital status: Married    Spouse name: Not on file  . Number of children: Not on file  . Years of education: Not on file  . Highest education level: Not on file  Occupational History  . Not on file  Tobacco Use  . Smoking status: Former Smoker    Packs/day: 1.00    Types: Cigarettes    Quit date: 04/12/2012    Years since quitting: 8.0  . Smokeless tobacco: Never Used  Substance and Sexual Activity  . Alcohol use: Yes    Alcohol/week: 1.0 standard drink    Types: 1 Standard drinks or equivalent per week  . Drug use: Yes    Types: Marijuana    Comment: cannibus  . Sexual activity: Not on file  Other Topics Concern  . Not on file  Social History Narrative   Married, father of 2, grandfather of 3.   He is a former smoker of about a pack to pack and half cigarettes a day -- he quit in March of this year.    He is an avid exerciser working at least 4-5 days a week doing her walking or stationary bike.   Social Determinants of Health   Financial Resource Strain: Low Risk   . Difficulty of Paying Living Expenses: Not very hard  Food Insecurity: Not on file  Transportation Needs: Not on file  Physical Activity: Not on file  Stress: Not on file  Social Connections: Not on file  Intimate Partner Violence: Not on file   Family History  Problem Relation Age of Onset  . Kidney  failure Mother   . Coronary artery disease Father   . Kidney failure Sister      Review of Systems  All other systems reviewed and are negative.      Objective:   Physical Exam Vitals reviewed.  Constitutional:      General: He is not in acute distress.    Appearance: He is well-developed. He is not diaphoretic.  HENT:     Head: Normocephalic and atraumatic.     Right Ear: External ear normal.     Left Ear: External ear normal.     Nose: Nose normal.     Mouth/Throat:     Pharynx: No oropharyngeal exudate.  Eyes:     General: No scleral icterus.       Right eye: No discharge.        Left eye: No discharge.     Conjunctiva/sclera: Conjunctivae normal.     Pupils: Pupils are equal, round, and reactive to light.  Neck:     Thyroid: No thyromegaly.  Vascular: No JVD.     Trachea: No tracheal deviation.  Cardiovascular:     Rate and Rhythm: Normal rate and regular rhythm.     Heart sounds: Murmur heard.  No friction rub. No gallop.   Pulmonary:     Effort: Pulmonary effort is normal. No respiratory distress.     Breath sounds: Normal breath sounds. No stridor. No wheezing or rales.  Chest:     Chest wall: No tenderness.  Abdominal:     General: Bowel sounds are normal. There is no distension.     Palpations: Abdomen is soft. There is no mass.     Tenderness: There is no abdominal tenderness. There is no guarding or rebound.  Musculoskeletal:        General: No tenderness or deformity. Normal range of motion.     Cervical back: Normal range of motion and neck supple.  Lymphadenopathy:     Cervical: No cervical adenopathy.  Skin:    General: Skin is warm.     Coloration: Skin is not pale.     Findings: No erythema or rash.  Neurological:     Mental Status: He is alert and oriented to person, place, and time.     Cranial Nerves: No cranial nerve deficit.     Motor: No abnormal muscle tone.     Coordination: Coordination normal.     Deep Tendon Reflexes:  Reflexes are normal and symmetric.  Psychiatric:        Behavior: Behavior normal.        Thought Content: Thought content normal.        Judgment: Judgment normal.           Assessment & Plan:  General medical exam  Essential hypertension  CKD stage 3 due to type 2 diabetes mellitus (HCC)  ASCVD (arteriosclerotic cardiovascular disease)  Controlled type 2 diabetes mellitus with other circulatory complication, without long-term current use of insulin (HCC)  Blood pressure is elevated today.  He also has chronic kidney disease.  Therefore I am going to start him on doxazosin 2 mg a day and recheck blood pressure in 1 month.  Given his elevated albumin to creatinine ratio coupled with diabetes I recommended adding Farxiga 10 mg a day and rechecking a urine albumin to creatinine ratio in 6 months along with his renal function.  Colonoscopy is up-to-date.  He is due for a PSA anytime after March 9.  He is elected to come back in 6 months we will just check it then.  Immunizations are up-to-date except for the shingles vaccine which I recommended.  He denies any falls, depression, or memory loss.  Swelling in his legs is well controlled.  He denies any neuropathy in his feet.

## 2020-04-16 NOTE — Telephone Encounter (Signed)
Pt will call the insurance company to see what med is covered and call back

## 2020-04-17 ENCOUNTER — Telehealth: Payer: Self-pay | Admitting: Family Medicine

## 2020-04-17 MED ORDER — DAPAGLIFLOZIN PROPANEDIOL 10 MG PO TABS: 10.0000 mg | ORAL_TABLET | Freq: Every day | ORAL | 3 refills | Status: DC

## 2020-04-17 NOTE — Telephone Encounter (Signed)
Spoke with pt, changed medication and refill amount due to cost. Medication sent

## 2020-04-17 NOTE — Telephone Encounter (Signed)
Pt called in wanting to get a 90 day supply of  dapagliflozin propanediol (FARXIGA) 10 MG TABS tablet  Pt stated he would rather pay for 90 day supply, and to please send to   [facility]  Cb#: [phone]

## 2020-04-28 DIAGNOSIS — E119 Type 2 diabetes mellitus without complications: Secondary | ICD-10-CM | POA: Diagnosis not present

## 2020-04-28 DIAGNOSIS — H35033 Hypertensive retinopathy, bilateral: Secondary | ICD-10-CM | POA: Diagnosis not present

## 2020-04-28 DIAGNOSIS — H04123 Dry eye syndrome of bilateral lacrimal glands: Secondary | ICD-10-CM | POA: Diagnosis not present

## 2020-04-28 DIAGNOSIS — H524 Presbyopia: Secondary | ICD-10-CM | POA: Diagnosis not present

## 2020-04-28 LAB — HM DIABETES EYE EXAM

## 2020-04-29 ENCOUNTER — Telehealth: Payer: Self-pay | Admitting: Pharmacist

## 2020-04-29 ENCOUNTER — Telehealth: Payer: Self-pay | Admitting: *Deleted

## 2020-04-29 ENCOUNTER — Telehealth: Payer: Self-pay

## 2020-04-29 NOTE — Progress Notes (Addendum)
Chronic Care Management Pharmacy Assistant   Name: James Jones  MRN: [id number] DOB: October 18, 1945  Reason for Encounter: Disease State For DM   Conditions to be addressed/monitored: hypertension, CAD, Diabetes, dyslipidemia.  Recent office visits:  04/15/20 Dr. Tanya Nones For General medical exam.STARTED Dapaglifozin Propanediol 10 mg daily, Doxazosin 2 mg daily.  Recent consult visits:  None since 04/04/20  Hospital visits:  None in previous 6 months  Medications: Outpatient Encounter Medications as of 04/29/2020  Medication Sig   allopurinol (ZYLOPRIM) 100 MG tablet Take 1 tablet (100 mg total) by mouth daily.   amLODipine (NORVASC) 10 MG tablet Take 1 tablet (10 mg total) by mouth daily.   atenolol (TENORMIN) 50 MG tablet Take 1 tablet (50 mg total) by mouth daily.   Blood Glucose Monitoring Suppl (FREESTYLE LITE) DEVI Use to check blood sugar once daily   Cholecalciferol (VITAMIN D-3) 1000 UNITS CAPS Take by mouth daily. 2 capsules.   dapagliflozin propanediol (FARXIGA) 10 MG TABS tablet Take 1 tablet (10 mg total) by mouth daily before breakfast.   doxazosin (CARDURA) 2 MG tablet Take 1 tablet (2 mg total) by mouth daily.   ferrous sulfate 325 (65 FE) MG tablet Take 1 tablet (325 mg total) by mouth 2 (two) times daily with a meal.   fish oil-omega-3 fatty acids 1000 MG capsule Take 1,200 mg by mouth daily.    furosemide (LASIX) 20 MG tablet Take 1 tablet (20 mg total) by mouth as needed. FOR LEG SWELLING   glipiZIDE (GLUCOTROL XL) 2.5 MG 24 hr tablet Take 1 tablet (2.5 mg total) by mouth daily with breakfast.   glucose blood (FREESTYLE LITE) test strip USE TO CHECK BLOOD SUGAR ONCE DAILY. E11.59   Multiple Vitamin (MULTIVITAMIN WITH MINERALS) TABS Take 1 tablet by mouth daily.   Potassium 99 MG TABS Take 99 mg by mouth daily.    predniSONE (DELTASONE) 20 MG tablet 3 tabs poqday 1-2, 2 tabs poqday 3-4, 1 tab poqday 5-6   PRESCRIPTION MEDICATION Uses C-PAP at bedtime    rosuvastatin (CRESTOR) 20 MG tablet Take 1 tablet (20 mg total) by mouth daily.   valsartan (DIOVAN) 320 MG tablet Take 1 tablet (320 mg total) by mouth daily.   No facility-administered encounter medications on file as of 04/29/2020.   Recent Relevant Labs: Lab Results  Component Value Date/Time   HGBA1C 7.0 (H) 04/11/2020 09:28 AM   HGBA1C 6.7 (A) 12/25/2019 10:16 AM   HGBA1C 6.3 (A) 06/19/2019 10:31 AM   HGBA1C 6.3 (H) 12/08/2018 11:23 AM   MICROALBUR 99.9 04/11/2020 09:28 AM   MICROALBUR 176.3 12/08/2018 11:23 AM    Kidney Function Lab Results  Component Value Date/Time   CREATININE 1.76 (H) 04/11/2020 09:28 AM   CREATININE 1.71 (H) 04/17/2019 08:55 AM   GFRNONAA 37 (L) 04/11/2020 09:28 AM   GFRAA 43 (L) 04/11/2020 09:28 AM    Current antihyperglycemic regimen:  Glipizide XL 2.5 mg daily  Farxiga 10 mg daily   What recent interventions/DTPs have been made to improve glycemic control:  None.  Have there been any recent hospitalizations or ED visits since last visit with CPP? Patient stated no.  Patient reports hypoglycemic symptoms, including Nervous/irritable   Patient reports hyperglycemic symptoms, including fatigue and polyuria   How often are you checking your blood sugar? Patient stated twice daily   What are your blood sugars ranging?  Patient stated sometimes he writes his blood sugar readings down but he was not at home at the  time of the call so I was unable to get any blood sugar readings.   During the week, how often does your blood glucose drop below 70? Patient stated in the last two weeks his blood sugar has dropped below 70, 3-4 times. He is aware of what to do when the blood sugar drops below 70.   Are you checking your feet daily/regularly?  Patient stated he checks his feet regularly.  Adherence Review: Is the patient currently on a STATIN medication? Rosuvastatin 20 mg    Is the patient currently on ACE/ARB medication? Valsartan 320 mg   Does  the patient have >5 day gap between last estimated fill dates? Per misc rpts, no.  Star Rating Drugs:Valsartan 320 mg 04/08/20 90 DS , Rosuvastatin 20 mg 04/08/20 90 DS, Glipizide 2.5 mg 1 tablet daily 02/06/20 90 DS, Dapagliflozin propanediol 10 mg 1 tablet daily 04/17/20 90 DS  Patient stated since he started Farxiga 10 mg daily he has a lot of health concerns. He stated this medication has caused him to having tingling in his hands and arms.He stated his blood pressure has been running high around the 160's. He stated his stomach his hurting. He stated he feels nervous and not at ease since he stated [place]. Ive sent a message to Dr. Tanya Nones to inform him of this medication concern.    Follow:Up Pharmacist Review  Hulen Luster, RMA Clinical Pharmacist Assistant [phone]  10 minutes spent in review, coordination, and documentation.  Patient has appointment with Dr. Tanya Nones tomorrow.  Will follow and review after that visit.  Reviewed by: Willa Frater, PharmD Clinical Pharmacist [facility] [phone]

## 2020-04-29 NOTE — Telephone Encounter (Signed)
Call placed to patient to inquire.   Reports that since starting Farxiga, he has noted FSBS to be around 70 or below. States that he feels very nauseated with low FSBS, but he has no appetite at night.   Also states that he feels his BP has been running high, but is not sure if it is due to anxiety due to low FSBS. States that systolic is running up to 160 intermittently, but diastolic remains 42-35.  Also reports that he is having increased numbness and tingling in extremities and he fears neuropathy is getting worse due to SE of [place].   Advised to stop [place] x1 week. Advised to come in for evaluation if Sx do not subside off medication. Advised to contact office with BP/ FSBS readings after 1 week off medication.   States that he has appointment on 04/30/2020 to discuss with  PCP.

## 2020-04-29 NOTE — Telephone Encounter (Signed)
Pt has been scheduled for 4pm 04/30/20

## 2020-04-29 NOTE — Telephone Encounter (Signed)
James Jones makes him pee out extra sugar.  I wouldn't expect it to cause hot or cold flashes unless something else is wrong like an infection somewhere.  I would like to see him to make sure there is no infection.

## 2020-04-29 NOTE — Telephone Encounter (Signed)
-----   Message from Raquel Sarna sent at 04/29/2020  4:03 PM EDT ----- Regarding: Medication Concerns Good afternoon. I spoke to the patient recently and he had some concerns about his Farxiga:  Patient stated since he started Farxiga 10 mg daily he has a lot of health concerns. He stated this medication has caused him to having tingling in his hands and arms.He stated his blood pressure has been running high around the 160's. He stated his stomach his hurting. He stated he feels nervous and not at ease since he stated [place].

## 2020-04-30 ENCOUNTER — Ambulatory Visit (INDEPENDENT_AMBULATORY_CARE_PROVIDER_SITE_OTHER): Payer: Medicare Other | Admitting: Family Medicine

## 2020-04-30 ENCOUNTER — Other Ambulatory Visit: Payer: Self-pay

## 2020-04-30 ENCOUNTER — Encounter: Payer: Self-pay | Admitting: Family Medicine

## 2020-04-30 VITALS — BP 144/70 | HR 84 | Temp 98.2°F | Resp 14 | Ht 68.0 in | Wt 210.0 lb

## 2020-04-30 DIAGNOSIS — E1122 Type 2 diabetes mellitus with diabetic chronic kidney disease: Secondary | ICD-10-CM

## 2020-04-30 DIAGNOSIS — R350 Frequency of micturition: Secondary | ICD-10-CM | POA: Diagnosis not present

## 2020-04-30 DIAGNOSIS — E1159 Type 2 diabetes mellitus with other circulatory complications: Secondary | ICD-10-CM | POA: Diagnosis not present

## 2020-04-30 DIAGNOSIS — I2510 Atherosclerotic heart disease of native coronary artery without angina pectoris: Secondary | ICD-10-CM | POA: Diagnosis not present

## 2020-04-30 DIAGNOSIS — N183 Chronic kidney disease, stage 3 unspecified: Secondary | ICD-10-CM | POA: Diagnosis not present

## 2020-04-30 MED ORDER — ALLOPURINOL 100 MG PO TABS: 100.0000 mg | ORAL_TABLET | Freq: Every day | ORAL | 3 refills | Status: DC

## 2020-04-30 MED ORDER — ATENOLOL 50 MG PO TABS: 50.0000 mg | ORAL_TABLET | Freq: Every day | ORAL | 3 refills | Status: DC

## 2020-04-30 MED ORDER — DOXAZOSIN MESYLATE 2 MG PO TABS: 2.0000 mg | ORAL_TABLET | Freq: Every day | ORAL | 3 refills | Status: DC

## 2020-04-30 NOTE — Progress Notes (Signed)
Subjective:    Patient ID: James Jones, male    DOB: 06-Jul-1945, 75 y.o.   MRN: [id number]  HPI Please see the patient's office visit on March 8.  Due to his elevated A1c, his proteinuria, his declining renal function, I recommended starting him on Farxiga in an effort to prevent progression of his chronic kidney disease, reduce his risk of death from cardiovascular disease, and to better manage his diabetes.  Since taking the medication, the patient states he had a multitude of symptoms.  He states that he feels jittery similar to what he experiences with hypoglycemia.  He also reports feeling weak.  Has had abdominal discomfort.  Has had shaking chills.  He gives the example of having to sit wrapped in a blanket.  He has had hot flashes.  He has felt nauseated.  I obtain a urinalysis today to rule out a urinary tract infection.  There is +2 glucose, +3 protein, trace blood.  Otherwise there is no leukocyte esterase and no nitrates.  Patient states that he is still on the [place].  He is tried drinking more water over the last 2 days and his symptoms have all but gone away.  He denies any cough or shortness of breath or chest pain.  He has had no further abdominal discomfort.  He denies any dysuria, urgency, hematuria, or back pain.  He has had no nausea or vomiting.  The weak spells have improved. Past Medical History:  Diagnosis Date  . 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 Coronary artery disease, non-occlusive 03/18/2012   60-70% stenosis in RCA -- FFR 0.84; EF 60-65%  . Diabetes mellitus without complication (HCC)   . GERD (gastroesophageal reflux disease)    On PPI  . History of GI bleed January 2015   No obvious findings on EGD/colonoscopy  . Hydrocele, bilateral    large on [place] 2020  . Hyperlipidemia   . Hypertension   . OSA (obstructive sleep apnea) 03/18/2012   Doing better with CPAP  . Pulmonary hypertension (HCC) 12/10/2010   ECHO:  Mild PH,mild LVH;  PA pressures estimated 30-40 mmHg  . 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 APPENDECTOMY  1974  . CARPAL TUNNEL RELEASE    . EYE SURGERY  1960   Left eye  . HEMORRHOID SURGERY    . LEFT HEART CATH AND CORONARY ANGIOGRAPHY  03/27/2010   Moderate mid RCA lesion,right radial approach,normal EF  . LEFT HEART CATHETERIZATION WITH CORONARY ANGIOGRAM N/A 03/17/2012   Procedure: LEFT HEART CATHETERIZATION WITH CORONARY ANGIOGRAM;  Surgeon: Marykay Lex, MD;  Location: [facility] CATH LAB;  Service: Cardiovascular: Fractional Flow Reserve Measurement of mid RCA 60-70% stenosis = 0.84. Otherwise mild LCA CAD.  Normal EF & EDP  . SHOULDER ARTHROSCOPY  2006  . TRANSTHORACIC ECHOCARDIOGRAM  12/2010    Mild concentric LVH.  EF> 55%.  GR 1 DD.  Mild aortic sclerosis no stenosis.  PA pressures estimated 30-40 mmHg   Current Outpatient Medications on File Prior to Visit  Medication Sig Dispense Refill  . amLODipine (NORVASC) 10 MG tablet Take 1 tablet (10 mg total) by mouth daily. 90 tablet 3  . Blood Glucose Monitoring Suppl (FREESTYLE LITE) DEVI Use to check blood sugar once daily 1 each 0  . Cholecalciferol (VITAMIN D-3) 1000 UNITS CAPS Take  by mouth daily. 2 capsules.    . dapagliflozin propanediol (FARXIGA) 10 MG TABS tablet Take 1 tablet (10 mg total) by mouth daily before breakfast. 90 tablet 3  . ferrous sulfate 325 (65 FE) MG tablet Take 1 tablet (325 mg total) by mouth 2 (two) times daily with a meal. 180 tablet 3  . fish oil-omega-3 fatty acids 1000 MG capsule Take 1,200 mg by mouth daily.     Marland Kitchen glipiZIDE (GLUCOTROL XL) 2.5 MG 24 hr tablet Take 1 tablet (2.5 mg total) by mouth daily with breakfast. 90 tablet 1  . glucose blood (FREESTYLE LITE) test strip USE TO CHECK BLOOD SUGAR ONCE DAILY. E11.59 100 strip 12  . Multiple Vitamin (MULTIVITAMIN WITH  MINERALS) TABS Take 1 tablet by mouth daily.    . Potassium 99 MG TABS Take 99 mg by mouth daily.     Marland Kitchen PRESCRIPTION MEDICATION Uses C-PAP at bedtime    . rosuvastatin (CRESTOR) 20 MG tablet Take 1 tablet (20 mg total) by mouth daily. 90 tablet 2  . valsartan (DIOVAN) 320 MG tablet Take 1 tablet (320 mg total) by mouth daily. 90 tablet 0  . furosemide (LASIX) 20 MG tablet Take 1 tablet (20 mg total) by mouth as needed. FOR LEG SWELLING 90 tablet 3   No current facility-administered medications on file prior to visit.   No Known Allergies Social History   Socioeconomic History  . Marital status: Married    Spouse name: Not on file  . Number of children: Not on file  . Years of education: Not on file  . Highest education level: Not on file  Occupational History  . Not on file  Tobacco Use  . Smoking status: Former Smoker    Packs/day: 1.00    Types: Cigarettes    Quit date: 04/12/2012    Years since quitting: 8.0  . Smokeless tobacco: Never Used  Substance and Sexual Activity  . Alcohol use: Yes    Alcohol/week: 1.0 standard drink    Types: 1 Standard drinks or equivalent per week  . Drug use: Yes    Types: Marijuana    Comment: cannibus  . Sexual activity: Not on file  Other Topics Concern  . Not on file  Social History Narrative   Married, father of 2, grandfather of 3.   He is a former smoker of about a pack to pack and half cigarettes a day -- he quit in March of this year.    He is an avid exerciser working at least 4-5 days a week doing her walking or stationary bike.   Social Determinants of Health   Financial Resource Strain: Low Risk   . Difficulty of Paying Living Expenses: Not very hard  Food Insecurity: Not on file  Transportation Needs: Not on file  Physical Activity: Not on file  Stress: Not on file  Social Connections: Not on file  Intimate Partner Violence: Not on file   Family History  Problem Relation Age of Onset  . Kidney failure Mother   .  Coronary artery disease Father   . Kidney failure Sister      Review of Systems  All other systems reviewed and are negative.      Objective:   Physical Exam Vitals reviewed.  Constitutional:      General: He is not in acute distress.    Appearance: He is well-developed. He is not diaphoretic.  HENT:     Head: Normocephalic and atraumatic.  Right Ear: External ear normal.     Left Ear: External ear normal.     Nose: Nose normal.     Mouth/Throat:     Pharynx: No oropharyngeal exudate.  Eyes:     General: No scleral icterus.       Right eye: No discharge.        Left eye: No discharge.     Conjunctiva/sclera: Conjunctivae normal.     Pupils: Pupils are equal, round, and reactive to light.  Neck:     Thyroid: No thyromegaly.     Vascular: No JVD.     Trachea: No tracheal deviation.  Cardiovascular:     Rate and Rhythm: Normal rate and regular rhythm.     Heart sounds: Murmur heard.  No friction rub. No gallop.   Pulmonary:     Effort: Pulmonary effort is normal. No respiratory distress.     Breath sounds: Normal breath sounds. No stridor. No wheezing or rales.  Chest:     Chest wall: No tenderness.  Abdominal:     General: Bowel sounds are normal. There is no distension.     Palpations: Abdomen is soft. There is no mass.     Tenderness: There is no abdominal tenderness. There is no guarding or rebound.  Musculoskeletal:        General: No tenderness or deformity. Normal range of motion.     Cervical back: Normal range of motion and neck supple.  Lymphadenopathy:     Cervical: No cervical adenopathy.  Skin:    General: Skin is warm.     Coloration: Skin is not pale.     Findings: No erythema or rash.  Neurological:     Mental Status: He is alert and oriented to person, place, and time.     Cranial Nerves: No cranial nerve deficit.     Motor: No abnormal muscle tone.     Coordination: Coordination normal.     Deep Tendon Reflexes: Reflexes are normal and  symmetric.  Psychiatric:        Behavior: Behavior normal.        Thought Content: Thought content normal.        Judgment: Judgment normal.           Assessment & Plan:   Urinary frequency - Plan: Urinalysis, Routine w reflex microscopic  CKD stage 3 due to type 2 diabetes mellitus (HCC)  Controlled type 2 diabetes mellitus with other circulatory complication, without long-term current use of insulin (HCC)  ASCVD (arteriosclerotic cardiovascular disease)  I believe the patient was likely dehydrated.  I do not believe that [name] would cause all the side effects.  There is no apparent urinary tract infection.  I have asked him to hold the [place] for 1 week.  Of asked him to push fluids.  If he feels completely better, I would like him to retry [place] next week.  If symptoms return we are going to discontinue the medication altogether.  If he does better we will maintain the medication where it is.

## 2020-05-01 LAB — URINALYSIS, ROUTINE W REFLEX MICROSCOPIC
Bacteria, UA: NONE SEEN /HPF
Bilirubin Urine: NEGATIVE
Hyaline Cast: NONE SEEN /LPF
Ketones, ur: NEGATIVE
Leukocytes,Ua: NEGATIVE
Nitrite: NEGATIVE
Specific Gravity, Urine: 1.015 (ref 1.001–1.03)
Squamous Epithelial / HPF: NONE SEEN /HPF (ref <=5)
WBC, UA: NONE SEEN /HPF (ref 0–5)
pH: 5.5 (ref 5.0–8.0)

## 2020-05-01 LAB — MICROSCOPIC MESSAGE

## 2020-05-13 DIAGNOSIS — Z23 Encounter for immunization: Secondary | ICD-10-CM | POA: Diagnosis not present

## 2020-05-27 ENCOUNTER — Encounter: Payer: Self-pay | Admitting: Internal Medicine

## 2020-05-27 ENCOUNTER — Ambulatory Visit (INDEPENDENT_AMBULATORY_CARE_PROVIDER_SITE_OTHER): Payer: Medicare Other | Admitting: Internal Medicine

## 2020-05-27 ENCOUNTER — Other Ambulatory Visit: Payer: Self-pay

## 2020-05-27 VITALS — BP 128/88 | HR 59 | Ht 68.0 in | Wt 211.2 lb

## 2020-05-27 DIAGNOSIS — E785 Hyperlipidemia, unspecified: Secondary | ICD-10-CM | POA: Diagnosis not present

## 2020-05-27 DIAGNOSIS — E669 Obesity, unspecified: Secondary | ICD-10-CM

## 2020-05-27 DIAGNOSIS — Z6830 Body mass index (BMI) 30.0-30.9, adult: Secondary | ICD-10-CM

## 2020-05-27 DIAGNOSIS — E1159 Type 2 diabetes mellitus with other circulatory complications: Secondary | ICD-10-CM

## 2020-05-27 DIAGNOSIS — I2510 Atherosclerotic heart disease of native coronary artery without angina pectoris: Secondary | ICD-10-CM

## 2020-05-27 MED ORDER — FREESTYLE LITE TEST VI STRP: ORAL_STRIP | 12 refills | Status: DC

## 2020-05-27 NOTE — Patient Instructions (Addendum)
Continue: - Glipizide XL 2.5 mg mg before breakfast - Farxiga 10 mg before breakfast  Cut back on eggs and other fatty foods as much as possible.  Please return in 4-6 months with your sugar log.

## 2020-05-27 NOTE — Progress Notes (Signed)
Patient ID: Dayden Viverette, male   DOB: [birth year], 75 y.o.   MRN: [id number]  This visit occurred during the SARS-CoV-2 public health emergency.  Safety protocols were in place, including screening questions prior to the visit, additional usage of staff PPE, and extensive cleaning of exam room while observing appropriate contact time as indicated for disinfecting solutions.   HPI: Timothee Gali is a 75 y.o.-year-old male, returning for f/u for DM2, dx in ~2005, non-insulin-dependent, lately more controlled, with complications (CAD, CKD). Last visit 5 months ago. PCP: Dr. Lynnea Ferrier  Interim history: Since last visit, he has done well, but he had a higher HbA1c at last visit with PCP last month.  PCP added [place].  He tolerates it well. He denies significantly increased urination, blurry vision, nausea.  Reviewed HbA1c levels: Lab Results  Component Value Date   HGBA1C 7.0 (H) 04/11/2020   HGBA1C 6.7 (A) 12/25/2019   HGBA1C 6.3 (A) 06/19/2019  04/11/2014: HbA1c 8.4% 01/08/2014: HbA1c 7.7% He had steroid inj for gout in his elbows - 12/2013. He also had a steroid inj in knee fall 2016, too.   He is on: - Glipizide ER 2.5 mg daily in a.m. - Farxiga 10 mg daily - started by PCP - started 1 mo ago - very expensive (664$ for 3 mo >> but will be cheaper further) We had to stop Januvia since last visit due to price.  Tradjenta was also not covered. Previously on low-dose Metformin ER, but stopped 07/2018 due to CKD Had nausea, vomiting, loss of appetite with regular metformin.  He was on Actos. He was on insulin before (Lantus) - came off years ago.  Pt checks his sugars 1-2 times a day - on Faxiga: - am: 79, 92-114 >> 104-133, 144 >> 107-129, 132 >> 87-124, 141 - 2h after b'fast: 107 >> n/c >> 119, 160 >> n/c >> 119, 133 - before lunch: 87 >> 86, 105 >> 119-142 >> 122, 140 > 109-148 - 2h after lunch: n/c >> 155, 285 (Prednisone) >> n/c >> 110 - before dinner:  110-148 (snack) >> 87 >>  n/c >> 91, 151 - 2h after dinner: 103 >> 89, 148 >> 79, 118 >> n/c >> 107, 164 - bedtime: 123, 146 >> 89-135 >> n/c >> 109-142, 187 - nighttimen/c >> 100 >> n/c  >> 122 >> n/c Lowest 54 x1 >>... 79 >> 107 >> 87; he has hypoglycemia awareness in the 70s. Highest sugar was 285 (Prednisone) >> 148 >> 144 >> 140 >> 187  Glucometer: Freestyle [employer] did not cover Freestyle Libre CGM.  Pt's meals are: - Breakfast: bacon + eggs, sausage, grits, oatmeal, cereal, toast wheat - Lunch: BLT, soups, fruit, nuts - Dinner: chicken, pork + greens, rice  - Snacks: 1 a day: pretzels; carrots  Previously exercising by walking several times a week, but stopped exercising due to the coronavirus pandemic. Now walking 6 mi MWF at the [facility].  -+ CKD-sees nephrology (renal ultrasound showed renal cysts), latest BUN/creatinine:  Lab Results  Component Value Date   BUN 23 04/11/2020   CREATININE 1.76 (H) 04/11/2020  05/24/2014: 34/1.64 01/08/2014: 18/1.15 On valsartan.  -+ HL; last set of lipids: Lab Results  Component Value Date   CHOL 167 04/11/2020   HDL 54 04/11/2020   LDLCALC 92 04/11/2020   TRIG 118 04/11/2020   CHOLHDL 3.1 04/11/2020  On rosuvastatin 20, fish oil.  He still has some muscle aches, improved.  He had more muscle aches when he was  on pravastatin 40 before.  - last eye exam was in 04/2020: No DR.  He has history of cataract surgery in 2016 and 17.  - no numbness and tingling in his feet.  Latest TSH was normal: Lab Results  Component Value Date   TSH 1.82 04/08/2016   ROS: Constitutional: no weight gain/no weight loss, no fatigue, no subjective hyperthermia, no subjective hypothermia Eyes: no blurry vision, no xerophthalmia ENT: no sore throat, no nodules palpated in neck, no dysphagia, no odynophagia, no hoarseness Cardiovascular: no CP/no SOB/no palpitations/no leg swelling Respiratory: no cough/no SOB/no wheezing Gastrointestinal: no N/no V/no D/no C/no acid  reflux Musculoskeletal: no muscle aches/no joint aches Skin: no rashes, no hair loss Neurological: no tremors/no numbness/no tingling/no dizziness  I reviewed pt's medications, allergies, PMH, social hx, family hx, and changes were documented in the history of present illness. Otherwise, unchanged from my initial visit note.   Past Medical History:  Diagnosis Date  . 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 Coronary artery disease, non-occlusive 03/18/2012   60-70% stenosis in RCA -- FFR 0.84; EF 60-65%  . Diabetes mellitus without complication (HCC)   . GERD (gastroesophageal reflux disease)    On PPI  . History of GI bleed January 2015   No obvious findings on EGD/colonoscopy  . Hydrocele, bilateral    large on US 2020  . Hyperlipidemia   . Hypertension   . OSA (obstructive sleep apnea) 03/18/2012   Doing better with CPAP  . Pulmonary hypertension (HCC) 12/10/2010   ECHO:  Mild PH,mild LVH; PA pressures estimated 30-40 mmHg  . 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   Event monitor; usually during sleeping hours; therefore not on beta blocker  + Gout  Past Surgical History:  Procedure Laterality Date  . APPENDECTOMY  1974  . CARPAL TUNNEL RELEASE    . EYE SURGERY  1960   Left eye  . HEMORRHOID SURGERY    . LEFT HEART CATH AND CORONARY ANGIOGRAPHY  03/27/2010   Moderate mid RCA lesion,right radial approach,normal EF  . LEFT HEART CATHETERIZATION WITH CORONARY ANGIOGRAM N/A 03/17/2012   Procedure: LEFT HEART CATHETERIZATION WITH CORONARY ANGIOGRAM;  Surgeon: Marykay Lexavid W Harding, MD;  [facility] C[facility] LAB;  Service: Cardiovascular: Fractional Flow Reserve Measurement of mid RCA 60-70% stenosis = 0.84. Otherwise mild LCA CAD.  Normal EF & EDP  . SHOULDER ARTHROSCOPY  2006  . TRANSTHORACIC ECHOCARDIOGRAM  12/2010    Mild concentric LVH.  EF> 55%.  GR 1 DD.  Mild aortic sclerosis no stenosis.  PA pressures estimated 30-40 mmHg   History    Social History  . Marital Status: Married    Spouse Name: N/A   Occupational History  . retired   Social History Main Topics  . Smoking status: Former Smoker -- 2.00 packs/day    Quit date: 04/12/2012  . Smokeless tobacco: Never Used  . Alcohol Use: 0.5 oz/week    1 drink(s) per week  . Drug Use: Yes    Special: Marijuana     Comment: cannibus   Social History Narrative   Married, father of 2, grandfather of 3.   He is a former smoker of about a pack to pack and half cigarettes a day -- he quit in March of this year.    He is an avid exerciser working at least 4-5 days a week doing her walking or stationary bike.   Current Outpatient Medications on File Prior to Visit  Medication Sig Dispense Refill  . allopurinol (ZYLOPRIM) 100 MG tablet Take 1 tablet (100 mg total) by mouth daily. 90 tablet 3  . amLODipine (NORVASC) 10 MG tablet Take 1 tablet (10 mg total) by mouth daily. 90 tablet 3  . atenolol (TENORMIN) 50 MG tablet Take 1 tablet (50 mg total) by mouth daily. 90 tablet 3  . Blood Glucose Monitoring Suppl (FREESTYLE LITE) DEVI Use to check blood sugar once daily 1 each 0  . Cholecalciferol (VITAMIN D-3) 1000 UNITS CAPS Take by mouth daily. 2 capsules.    . dapagliflozin propanediol (FARXIGA) 10 MG TABS tablet Take 1 tablet (10 mg total) by mouth daily before breakfast. 90 tablet 3  . doxazosin (CARDURA) 2 MG tablet Take 1 tablet (2 mg total) by mouth daily. 90 tablet 3  . ferrous sulfate 325 (65 FE) MG tablet Take 1 tablet (325 mg total) by mouth 2 (two) times daily with a meal. 180 tablet 3  . fish oil-omega-3 fatty acids 1000 MG capsule Take 1,200 mg by mouth daily.     . furosemide (LASIX) 20 MG tablet Take 1 tablet (20 mg total) by mouth as needed. FOR LEG SWELLING 90 tablet 3  . glipiZIDE (GLUCOTROL XL) 2.5 MG 24 hr tablet Take 1 tablet (2.5 mg total) by mouth daily with breakfast. 90 tablet 1  . glucose blood (FREESTYLE LITE) test strip USE TO CHECK BLOOD SUGAR ONCE  DAILY. E11.59 100 strip 12  . Multiple Vitamin (MULTIVITAMIN WITH MINERALS) TABS Take 1 tablet by mouth daily.    . Potassium 99 MG TABS Take 99 mg by mouth daily.     Marland Kitchen PRESCRIPTION MEDICATION Uses C-PAP at bedtime    . rosuvastatin (CRESTOR) 20 MG tablet Take 1 tablet (20 mg total) by mouth daily. 90 tablet 2  . valsartan (DIOVAN) 320 MG tablet Take 1 tablet (320 mg total) by mouth daily. 90 tablet 0   No current facility-administered medications on file prior to visit.   No Known Allergies Family History  Problem Relation Age of Onset  . Kidney failure Mother   . Coronary artery disease Father   . Kidney failure Sister    PE: BP 128/88 (BP Location: Right Arm, Patient Position: Sitting, Cuff Size: Normal)   Pulse (!) 59   Ht 5\' 8"  (1.727 m)   Wt 211 lb 3.2 oz (95.8 kg)   SpO2 98%   BMI 32.11 kg/m  Body mass index is 32.11 kg/m. Wt Readings from Last 3 Encounters:  05/27/20 211 lb 3.2 oz (95.8 kg)  04/30/20 210 lb (95.3 kg)  04/15/20 210 lb (95.3 kg)   Constitutional: overweight, in NAD Eyes: PERRLA, EOMI, no exophthalmos ENT: moist mucous membranes, no thyromegaly, no cervical lymphadenopathy Cardiovascular: RRR, No MRG Respiratory: CTA B Gastrointestinal: abdomen soft, NT, ND, BS+ Musculoskeletal: no deformities, strength intact in all 4 Skin: moist, warm, no rashes Neurological: no tremor with outstretched hands, DTR normal in all 4  ASSESSMENT: 1. DM2, non-insulin-dependent, uncontrolled, with complications - CAD - cardiologist Dr. 06/15/20 - CKD - was seeing Dr Herbie Baltimore >> changed  2. HL  3.  Obesity class I  PLAN:  1. Patient with longstanding, previously uncontrolled type 2 diabetes, with much improved control in the last 4 years.  He was on a DPP 4 inhibitor but he had to come off last year due to price.  He is now on a low-dose sulfonylurea.  He was previously on metformin ER but had to stop in 07/2018  due to CKD.  His HbA1c improved to 6.3%, but since  then, it increased. At last visit, HbA1c was slightly higher, at 6.8% and we did not change his regimen.  Since then, he had another HbA1c, of 7.0%, higher, last month. -At last visit, sugars were almost all at goal but he was not checking sugars later in the day and I strongly advised him to do so. -At this visit, sugars are almost all at goal, with only 3 mildly hyperglycemic exceptions in the last 2 weeks.  Upon questioning, PCP added Farxiga 10 mg daily after last HbA1c returned higher last months.  This is a wonderful addition if patient can afford it.  He had to pay more than $600 for the first prescription for 3 months, but apparently further prescriptions will be cheaper.  We can continue with this for now.  We can also continue the low-dose glipizide. - we did discuss about improving diet by reducing fatty foods to improve his insulin resistance and therefore his diabetes control - I suggested to:  Patient Instructions  Continue: - Glipizide XL 2.5 mg mg before breakfast - Farxiga 10 mg before breakfast  Cut back on eggs and other fatty foods as much as possible.  Please return in 4-6 months with your sugar log.   - advised to check sugars at different times of the day - 1x a day, rotating check times - advised for yearly eye exams >> he is UTD - return to clinic in 4-6 months  2. HL -Reviewed latest lipid panel from 04/2020: LDL above target, the rest of the fractions at goal: Lab Results  Component Value Date   CHOL 167 04/11/2020   HDL 54 04/11/2020   LDLCALC 92 04/11/2020   TRIG 118 04/11/2020   CHOLHDL 3.1 04/11/2020  -He had myalgias on atorvastatin and also on rosuvastatin, but currently on Crestor 20 mg daily without side effects -At this visit, we discussed about the still high LDL despite using Crestor.  He tells me that he eats 2 eggs every day.  We discussed about cutting down fatty foods including eggs.  3. Obesity class I -He lost 6 pounds before last visit, lost  1 lb since then -He continues on a sulfonylurea, which can cause weight gain, but we are using a low-dose.  He could not continue a DPP 4 inhibitor due to insurance coverage.  Carlus Pavlov, MD PhD [facility] Endocrinology

## 2020-07-02 NOTE — Progress Notes (Signed)
Chronic Care Management Pharmacy Note  07/08/2020 Name:  James Jones MRN:  [id number] DOB:  1945-05-14  Subjective: James Jones is an 75 y.o. year old male who is a primary patient of Pickard, Cammie Mcgee, MD.  The CCM team was consulted for assistance with disease management and care coordination needs.    Engaged with patient by telephone for follow up visit in response to provider referral for pharmacy case management and/or care coordination services.   Consent to Services:  The patient was given the following information about Chronic Care Management services today, agreed to services, and gave verbal consent: 1. CCM service includes personalized support from designated clinical staff supervised by the primary care provider, including individualized plan of care and coordination with other care providers 2. 24/7 contact phone numbers for assistance for urgent and routine care needs. 3. Service will only be billed when office clinical staff spend 20 minutes or more in a month to coordinate care. 4. Only one practitioner may furnish and bill the service in a calendar month. 5.The patient may stop CCM services at any time (effective at the end of the month) by phone call to the office staff. 6. The patient will be responsible for cost sharing (co-pay) of up to 20% of the service fee (after annual deductible is met). Patient agreed to services and consent obtained.  Patient Care Team: Susy Frizzle, MD as PCP - General (Family Medicine) Leonie Man, MD as PCP - Cardiology (Cardiology) Edythe Clarity, [facility] as Pharmacist (Pharmacist)  Recent office visits: 04/30/20 Dennard Schaumann) - urinary frequency, possible hypoglycemia, he held [place] for a week.  Was to resume if symptoms resolved.  Recent consult visits: 05/27/20 Cruzita Lederer, Endocrine) - no changes to meds, did report a possible copay issue with [place].  Patient to work on diet including eggs, etc.  Hospital visits: None in  previous 6 months   Objective:  Lab Results  Component Value Date   CREATININE 1.76 (H) 04/11/2020   BUN 23 04/11/2020   GFRNONAA 37 (L) 04/11/2020   GFRAA 43 (L) 04/11/2020   NA 138 04/11/2020   K 4.2 04/11/2020   CALCIUM 9.9 04/11/2020   CO2 19 (L) 04/11/2020   GLUCOSE 109 (H) 04/11/2020    Lab Results  Component Value Date/Time   HGBA1C 7.0 (H) 04/11/2020 09:28 AM   HGBA1C 6.7 (A) 12/25/2019 10:16 AM   HGBA1C 6.3 (A) 06/19/2019 10:31 AM   HGBA1C 6.3 (H) 12/08/2018 11:23 AM   MICROALBUR 99.9 04/11/2020 09:28 AM   MICROALBUR 176.3 12/08/2018 11:23 AM    Last diabetic Eye exam:  Lab Results  Component Value Date/Time   HMDIABEYEEXA No Retinopathy 04/28/2020 12:00 AM    Last diabetic Foot exam: No results found for: HMDIABFOOTEX   Lab Results  Component Value Date   CHOL 167 04/11/2020   HDL 54 04/11/2020   LDLCALC 92 04/11/2020   TRIG 118 04/11/2020   CHOLHDL 3.1 04/11/2020    Hepatic Function Latest Ref Rng & Units 04/11/2020 07/26/2019 04/17/2019  Total Protein 6.1 - 8.1 g/dL 6.5 6.9 6.4  Albumin 3.7 - 4.7 g/dL - 4.4 -  AST 10 - 35 U/L _0 ALT 9 - 46 U/L _1 Alk Phosphatase 48 - 121 IU/L - 102 -  Total Bilirubin 0.2 - 1.2 mg/dL 0.3 0.2 0.4  Bilirubin, Direct 0.00 - 0.40 mg/dL - 0.08 -    Lab Results  Component Value Date/Time   TSH 1.82  04/08/2016 08:28 AM   TSH 1.625 03/17/2012 03:10 PM    CBC Latest Ref Rng & Units 04/11/2020 04/17/2019 02/19/2019  WBC 3.8 - 10.8 Thousand/uL 7.7 8.9 8.5  Hemoglobin 13.2 - 17.1 g/dL 14.8 12.5(L) 13.9  Hematocrit 38.5 - 50.0 % 45.8 37.9(L) 41.8  Platelets 140 - 400 Thousand/uL 304 263 261    No results found for: VD25OH  Clinical ASCVD: No  The 10-year ASCVD risk score Mikey Bussing DC Jr., et al., 2013) is: 36.5%   Values used to calculate the score:     Age: 76 years     Sex: Male     Is Non-Hispanic African American: Yes     Diabetic: Yes     Tobacco smoker: No     Systolic Blood Pressure: 161 mmHg     Is BP  treated: Yes     HDL Cholesterol: 54 mg/dL     Total Cholesterol: 167 mg/dL    Depression screen [facility] 2/9 04/15/2020 04/17/2019 04/14/2018  Decreased Interest 0 0 0  Down, Depressed, Hopeless 0 0 0  PHQ - 2 Score 0 0 0  Altered sleeping - 0 -  Tired, decreased energy - 0 -  Change in appetite - 0 -  Feeling bad or failure about yourself  - 0 -  Trouble concentrating - 0 -  Moving slowly or fidgety/restless - 0 -  PHQ-9 Score - 0 -  Difficult doing work/chores - Not difficult at all -      Social History   Tobacco Use  Smoking Status Former Smoker  . Packs/day: 1.00  . Types: Cigarettes  . Quit date: 04/12/2012  . Years since quitting: 8.2  Smokeless Tobacco Never Used   BP Readings from Last 3 Encounters:  05/27/20 128/88  04/30/20 (!) 144/70  04/15/20 (!) 162/70   Pulse Readings from Last 3 Encounters:  05/27/20 (!) 59  04/30/20 84  04/15/20 64   Wt Readings from Last 3 Encounters:  05/27/20 211 lb 3.2 oz (95.8 kg)  04/30/20 210 lb (95.3 kg)  04/15/20 210 lb (95.3 kg)   BMI Readings from Last 3 Encounters:  05/27/20 32.11 kg/m  04/30/20 31.93 kg/m  04/15/20 31.93 kg/m    Assessment/Interventions: Review of patient past medical history, allergies, medications, health status, including review of consultants reports, laboratory and other test data, was performed as part of comprehensive evaluation and provision of chronic care management services.   SDOH:  (Social Determinants of Health) assessments and interventions performed: Yes   Financial Resource Strain: Not on file    SDOH Screenings   Alcohol Screen: Not on file  Depression (PHQ2-9): Low Risk   . PHQ-2 Score: 0  Financial Resource Strain: Not on file  Food Insecurity: Not on file  Housing: Not on file  Physical Activity: Not on file  Social Connections: Not on file  Stress: Not on file  Tobacco Use: Medium Risk  . Smoking Tobacco Use: Former Smoker  . Smokeless Tobacco Use: Never Used   Transportation Needs: Not on file    [place]  No Known Allergies  Medications Reviewed Today    Reviewed by Edythe Clarity, [facility] (Pharmacist) on 07/08/20 at 1149  Med List Status: <None>  Medication Order Taking? Sig Documenting Provider Last Dose Status Informant  allopurinol (ZYLOPRIM) 100 MG tablet [id number] No Take 1 tablet (100 mg total) by mouth daily. Susy Frizzle, MD Taking Active   amLODipine (NORVASC) 10 MG tablet [id number] No Take 1 tablet (  10 mg total) by mouth daily. Susy Frizzle, MD Taking Active   atenolol (TENORMIN) 50 MG tablet [id number] No Take 1 tablet (50 mg total) by mouth daily. Susy Frizzle, MD Taking Active   Blood Glucose Monitoring Suppl (FREESTYLE LITE) DEVI [id number] No Use to check blood sugar once daily Philemon Kingdom, MD Taking Active   Cholecalciferol (VITAMIN D-3) 1000 UNITS CAPS [id number] No Take by mouth daily. 2 capsules. [provider] Taking Active Self           Med Note Amedeo Plenty, Charlena Cross D   Thu Jul 31, 2015  2:06 PM)    dapagliflozin propanediol (FARXIGA) 10 MG TABS tablet [id number] No Take 1 tablet (10 mg total) by mouth daily before breakfast. Susy Frizzle, MD Taking Active   doxazosin (CARDURA) 2 MG tablet [id number] No Take 1 tablet (2 mg total) by mouth daily. Susy Frizzle, MD Taking Active   ferrous sulfate 325 (65 FE) MG tablet [id number] No Take 1 tablet (325 mg total) by mouth 2 (two) times daily with a meal. Susy Frizzle, MD Taking Active   fish oil-omega-3 fatty acids 1000 MG capsule [id number] No Take 1,200 mg by mouth daily.  [provider] Taking Active Self  furosemide (LASIX) 20 MG tablet [id number]  Take 1 tablet (20 mg total) by mouth as needed. FOR LEG SWELLING Troy Sine, MD  Expired 10/01/19 2359   glipiZIDE (GLUCOTROL XL) 2.5 MG 24 hr tablet [id number]  TAKE 1 TABLET (2.5 MG TOTAL) BY MOUTH DAILY WITH BREAKFAST. Philemon Kingdom, MD  Active   glucose blood (FREESTYLE LITE)  test strip [id number]  USE TO CHECK BLOOD SUGAR 1-2x DAILY. E11.59 Philemon Kingdom, MD  Active   Multiple Vitamin (MULTIVITAMIN WITH MINERALS) TABS [id number] No Take 1 tablet by mouth daily. [provider] Taking Active Self  Potassium 99 MG TABS [id number] No Take 99 mg by mouth daily.  [provider] Taking Active Self  PRESCRIPTION MEDICATION [id number] No Uses C-PAP at bedtime [provider] Taking Active Self  rosuvastatin (CRESTOR) 20 MG tablet [id number] No Take 1 tablet (20 mg total) by mouth daily. Susy Frizzle, MD Taking Active   valsartan (DIOVAN) 320 MG tablet [id number]  TAKE 1 TABLET BY MOUTH EVERY DAY Susy Frizzle, MD  Active   Med List Note Ines Bloomer 10/16/13 1353): Patient is using C-PAP MACHINE          Patient Active Problem List   Diagnosis Date Noted  . Class 1 obesity with serious comorbidity and body mass index (BMI) of 30.0 to 30.9 in adult 02/13/2019  . Heartburn 03/24/2016  . OSA on CPAP 08/02/2015  . Controlled diabetes mellitus with circulatory complication, without long-term current use of insulin ([place]) 04/29/2014  . Acute blood loss anemia 02/13/2013  . Dyslipidemia, goal LDL below 70 10/15/2012  . Overweight (BMI 25.0-29.9) 10/02/2012  . OSA (obstructive sleep apnea), has been cpap intolerant 03/18/2012  . 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 CAD (coronary artery disease), with 60-70% stenosis in RCA 03/18/2012  . Essential hypertension 03/17/2012  . RBBB 03/17/2012  . 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 Influenza, High Dose Seasonal PF 11/24/2017  . Influenza,inj,Quad PF,6+ Mos 10/22/2014, 10/29/2015  . PFIZER(Purple Top)SARS-COV-2 Vaccination 02/23/2019, 03/16/2019  . Pneumococcal Conjugate-13 04/13/2016  . Pneumococcal Polysaccharide-23 02/09/2008, 04/14/2018  . Tdap 02/09/2008  Conditions to be addressed/monitored:  HTN, DM, HLD  Care Plan : General Pharmacy (Adult)  Updates made by Edythe Clarity, RPH since 07/08/2020 12:00 AM    Problem: hypertension, CAD, Diabetes, dyslipidemia   Priority: High  Onset Date: 04/04/2020    Long-Range Goal: Patient-Specific Goal   Start Date: 04/04/2020  Expected End Date: 10/02/2020  Recent Progress: On track  Priority: High  Note:   Current Barriers:  . Unable to achieve control of fasting blood sugars, and BP    Pharmacist Clinical Goal(s):  Marland Kitchen Over the next 90 days, patient will achieve adherence to monitoring guidelines and medication adherence to achieve therapeutic efficacy . achieve control of fasting blood glucose as evidenced by home readigns . contact provider office for questions/concerns as evidenced notation of same in electronic health record through collaboration with PharmD and provider.   Interventions: . 1:1 collaboration with Susy Frizzle, MD regarding development and update of comprehensive plan of care as evidenced by provider attestation and co-signature . Inter-disciplinary care team collaboration (see longitudinal plan of care) . Comprehensive medication review performed; medication list updated in electronic medical record  Hypertension (BP goal <130/80) -controlled -Current treatment:  amlodipine 74m daily  atenolol 50 mg daily  valsartan 320 mg daily  Doxazosin 216mdaily - NEW -Medications previously tried: none noted  -Current home readings: no specific readings, patient checks and reports "normal" -Current dietary habits: see DM -[facility] -Denies hypotensive/hypertensive symptoms -Educated on BP goals and benefits of medications for prevention of heart attack, stroke and kidney damage; Exercise goal of 150 minutes per week; Importance of home blood pressure monitoring; -Counseled to monitor BP at home daily,  document, and provide log at future appointments -Recommended to continue current medication  Update 07/08/20 He reports having some blood pressures at 15182ystolic when he wakes up in the morning.  He continues to take all of his BP medications in the morning.  I have suggested he try moving his valsartan to an evening dose to see if this helps his morning blood pressures.  These BP's he is checking are also before any medications.  I have suggested he check his BP approximately 2 hours after his medications to see how they are effecting his BP.  Encouraged continued exercise and salt limitation.    Recommend continue current meds and will initiate monitoring plan.  Hyperlipidemia/CAD: (LDL goal < 70) -uncontrolled -Current treatment:  Rosuvastatin 2070mMedications previously tried: none noted  -Current dietary patterns: see DM -[place] habits[place] goals;  Benefits of statin for ASCVD risk reduction; -Need for updated lipid panel -Recommended to continue current medication, has not had lipid panel since he has been on this dose  Diabetes (A1c goal <7%) -controlled, based on last A1c -Current medications:  Glipizide XL 2.5mg65mily  Farxiga 10mg38mly? -Medications previously tried: JanuvTongat), Tradjenta (cost), metformin (kidney fxn) -Current home glucose readings . fasting glucose: 150-160 . post prandial glucose:  -Denies hypoglycemic/hyperglycemic symptoms -Current meal patterns:  . breakfast: boiled eggs, apple, oatmeal, cereal . lunch: normally skips . dinner: chicken baked or steamed . snacks:  . drinks: does report sodas as his weakness -Current exercise: [facility] and blood sugar goals; Exercise goal of 150 minutes per week; Benefits of routine self-monitoring of blood sugar; Limitation of carbs and sodas -Counseled to check feet daily and get yearly  eye exams -Recommended  patient make appt for updated A1c If A1c still elevated, consider restart of Januvia if patient can handle the cost.  -Also recommend patient work to cut back on sodas.  Update 07/08/20 -Patient recently started on Farxiga due to recent A1c increase to 7.0. Fasting Sugars - 112 Before Bedtime - 117 No other logs provided  He reports copay is acceptable after his deductible.  Based on income he would not qualify for and PAP programs.  Explained the benefit of medication and why he was experiencing more frequent urination.  Recommend he schedule A1c within the next week or two which would be 3 months after his most recent.    He continues to cut back on sodas.  Encouraged continued lifestyle mods to improve A1c  Patient Goals/Self-Care Activities . Over the next 90 days, patient will:  - take medications as prescribed check glucose daily, document, and provide at future appointments engage in dietary modifications by reducing soda intake Create appointment for updated A1c and other labs  Follow Up Plan: The care management team will reach out to the patient again over the next 90 days.            Medication Assistance: None required.  Patient affirms current coverage meets needs.  Patient's preferred pharmacy is:  [id number] [place], [place] [address] [place] [place]Phone: [phone]: [phone] [facility] [address]Phone: [phone]: [phone] Uses pill box? Yes Pt endorses 100% compliance  We discussed: Benefits of medication synchronization, packaging and delivery as well as enhanced pharmacist oversight with [employer]. Patient decided to: Continue current medication management strategy  Care Plan and Follow Up Patient Decision:  Patient agrees to Care Plan and Follow-up.  Plan:  The care management team will reach out to the patient again over the next 90 days.  CBeverly Milch PharmD Clinical Pharmacist [place]([phone]

## 2020-07-04 ENCOUNTER — Other Ambulatory Visit: Payer: Self-pay | Admitting: Internal Medicine

## 2020-07-04 ENCOUNTER — Other Ambulatory Visit: Payer: Self-pay | Admitting: Family Medicine

## 2020-07-04 DIAGNOSIS — E1159 Type 2 diabetes mellitus with other circulatory complications: Secondary | ICD-10-CM

## 2020-07-08 ENCOUNTER — Ambulatory Visit (INDEPENDENT_AMBULATORY_CARE_PROVIDER_SITE_OTHER): Payer: Medicare Other | Admitting: Pharmacist

## 2020-07-08 DIAGNOSIS — I10 Essential (primary) hypertension: Secondary | ICD-10-CM

## 2020-07-08 DIAGNOSIS — E1159 Type 2 diabetes mellitus with other circulatory complications: Secondary | ICD-10-CM | POA: Diagnosis not present

## 2020-07-08 NOTE — Patient Instructions (Addendum)
Visit Information  Goals Addressed            This Visit's Progress   . Monitor and Manage My Blood Sugar-Diabetes Type 2   On track    Timeframe:  Long-Range Goal Priority:  High Start Date:  04/04/20                           Expected End Date: 10/02/20                    Follow Up Date 07/08/20   - check blood sugar at prescribed times - check blood sugar if I feel it is too high or too low - take the blood sugar log to all doctor visits    Why is this important?    Checking your blood sugar at home helps to keep it from getting very high or very low.   Writing the results in a diary or log helps the doctor know how to care for you.   Your blood sugar log should have the time, date and the results.   Also, write down the amount of insulin or other medicine that you take.   Other information, like what you ate, exercise done and how you were feeling, will also be helpful.     Notes: Try to work on cutting back on sodas and limiting carbohydrates.  Shoot for < 130 fasting glucose. 07/08/20 -Cut back on sodas, started on Farxiga.  FBG seems to be controlled based on home logs.      Patient Care Plan: General Pharmacy (Adult)    Problem Identified: hypertension, CAD, Diabetes, dyslipidemia   Priority: High  Onset Date: 04/04/2020    Long-Range Goal: Patient-Specific Goal   Start Date: 04/04/2020  Expected End Date: 10/02/2020  Recent Progress: On track  Priority: High  Note:   Current Barriers:  . Unable to achieve control of fasting blood sugars, and BP    Pharmacist Clinical Goal(s):  Marland Kitchen Over the next 90 days, patient will achieve adherence to monitoring guidelines and medication adherence to achieve therapeutic efficacy . achieve control of fasting blood glucose as evidenced by home readigns . contact provider office for questions/concerns as evidenced notation of same in electronic health record through collaboration with PharmD and provider.    Interventions: . 1:1 collaboration with Donita Brooks, MD regarding development and update of comprehensive plan of care as evidenced by provider attestation and co-signature . Inter-disciplinary care team collaboration (see longitudinal plan of care) . Comprehensive medication review performed; medication list updated in electronic medical record  Hypertension (BP goal <130/80) -controlled -Current treatment:  amlodipine 10mg  daily  atenolol 50 mg daily  valsartan 320 mg daily  Doxazosin 2mg  daily - NEW -Medications previously tried: none noted  -Current home readings: no specific readings, patient checks and reports "normal" -Current dietary habits: see DM -Current exercise habits: goes to [facility] and walkes 3 times per week -Denies hypotensive/hypertensive symptoms -Educated on BP goals and benefits of medications for prevention of heart attack, stroke and kidney damage; Exercise goal of 150 minutes per week; Importance of home blood pressure monitoring; -Counseled to monitor BP at home daily, document, and provide log at future appointments -Recommended to continue current medication  Update 07/08/20 He reports having some blood pressures at 150 systolic when he wakes up in the morning.  He continues to take all of his BP medications in the morning.  I have suggested  he try moving his valsartan to an evening dose to see if this helps his morning blood pressures.  These BP's he is checking are also before any medications.  I have suggested he check his BP approximately 2 hours after his medications to see how they are effecting his BP.  Encouraged continued exercise and salt limitation.    Recommend continue current meds and will initiate monitoring plan.  Hyperlipidemia/CAD: (LDL goal < 70) -uncontrolled -Current treatment:  Rosuvastatin 20mg  -Medications previously tried: none noted  -Current dietary patterns: see DM -Current exercise habits: walks at [facility] -Educated  on Cholesterol goals;  Benefits of statin for ASCVD risk reduction; -Need for updated lipid panel -Recommended to continue current medication, has not had lipid panel since he has been on this dose  Diabetes (A1c goal <7%) -controlled, based on last A1c -Current medications:  Glipizide XL 2.5mg  daily  Farxiga 10mg  daily? -Medications previously tried: [facility] (cost), Tradjenta (cost), metformin (kidney fxn) -Current home glucose readings . fasting glucose: 150-160 . post prandial glucose:  -Denies hypoglycemic/hyperglycemic symptoms -Current meal patterns:  . breakfast: boiled eggs, apple, oatmeal, cereal . lunch: normally skips . dinner: chicken baked or steamed . snacks:  . drinks: does report sodas as his weakness -Current exercise: walks three times weekly at [facility] on A1c and blood sugar goals; Exercise goal of 150 minutes per week; Benefits of routine self-monitoring of blood sugar; Limitation of carbs and sodas -Counseled to check feet daily and get yearly eye exams -Recommended patient make appt for updated A1c If A1c still elevated, consider restart of Januvia if patient can handle the cost.  -Also recommend patient work to cut back on sodas.  Update 07/08/20 -Patient recently started on Farxiga due to recent A1c increase to 7.0. Fasting Sugars - 112 Before Bedtime - 117 No other logs provided  He reports copay is acceptable after his deductible.  Based on income he would not qualify for and PAP programs.  Explained the benefit of medication and why he was experiencing more frequent urination.  Recommend he schedule A1c within the next week or two which would be 3 months after his most recent.    He continues to cut back on sodas.  Encouraged continued lifestyle mods to improve A1c  Patient Goals/Self-Care Activities . Over the next 90 days, patient will:  - take medications as prescribed check glucose daily, document, and provide at future  appointments engage in dietary modifications by reducing soda intake Create appointment for updated A1c and other labs  Follow Up Plan: The care management team will reach out to the patient again over the next 90 days.            The patient verbalized understanding of instructions, educational materials, and care plan provided today and agreed to receive a mailed copy of patient instructions, educational materials, and care plan.  Telephone follow up appointment with pharmacy team member scheduled for: 90 days  07/10/20, [facility]  Managing Your Hypertension Hypertension, also called high blood pressure, is when the force of the blood pressing against the walls of the arteries is too strong. Arteries are blood vessels that carry blood from your heart throughout your body. Hypertension forces the heart to work harder to pump blood and may cause the arteries to become narrow or stiff. Understanding blood pressure readings Your personal target blood pressure may vary depending on your medical conditions, your age, and other factors. A blood pressure reading includes a higher number over a  lower number. Ideally, your blood pressure should be below 120/80. You should know that:  The first, or top, number is called the systolic pressure. It is a measure of the pressure in your arteries as your heart beats.  The second, or bottom number, is called the diastolic pressure. It is a measure of the pressure in your arteries as the heart relaxes. Blood pressure is classified into four stages. Based on your blood pressure reading, your health care provider may use the following stages to determine what type of treatment you need, if any. Systolic pressure and diastolic pressure are measured in a unit called mmHg. Normal  Systolic pressure: below 120.  Diastolic pressure: below 80. Elevated  Systolic pressure: 120-129.  Diastolic pressure: below 80. Hypertension stage 1  Systolic  pressure: 130-139.  Diastolic pressure: 80-89. Hypertension stage 2  Systolic pressure: 140 or above.  Diastolic pressure: 90 or above. How can this condition affect me? Managing your hypertension is an important responsibility. Over time, hypertension can damage the arteries and decrease blood flow to important parts of the body, including the brain, heart, and kidneys. Having untreated or uncontrolled hypertension can lead to:  A heart attack.  A stroke.  A weakened blood vessel (aneurysm).  Heart failure.  Kidney damage.  Eye damage.  Metabolic syndrome.  Memory and concentration problems.  Vascular dementia. What actions can I take to manage this condition? Hypertension can be managed by making lifestyle changes and possibly by taking medicines. Your health care provider will help you make a plan to bring your blood pressure within a normal range. Nutrition  Eat a diet that is high in fiber and potassium, and low in salt (sodium), added sugar, and fat. An example eating plan is called the Dietary Approaches to Stop Hypertension (DASH) diet. To eat this way: ? Eat plenty of fresh fruits and vegetables. Try to fill one-half of your plate at each meal with fruits and vegetables. ? Eat whole grains, such as whole-wheat pasta, brown rice, or whole-grain bread. Fill about one-fourth of your plate with whole grains. ? Eat low-fat dairy products. ? Avoid fatty cuts of meat, processed or cured meats, and poultry with skin. Fill about one-fourth of your plate with lean proteins such as fish, chicken without skin, beans, eggs, and tofu. ? Avoid pre-made and processed foods. These tend to be higher in sodium, added sugar, and fat.  Reduce your daily sodium intake. Most people with hypertension should eat less than 1,500 mg of sodium a day.   Lifestyle  Work with your health care provider to maintain a healthy body weight or to lose weight. Ask what an ideal weight is for you.  Get  at least 30 minutes of exercise that causes your heart to beat faster (aerobic exercise) most days of the week. Activities may include walking, swimming, or biking.  Include exercise to strengthen your muscles (resistance exercise), such as weight lifting, as part of your weekly exercise routine. Try to do these types of exercises for 30 minutes at least 3 days a week.  Do not use any products that contain nicotine or tobacco, such as cigarettes, e-cigarettes, and chewing tobacco. If you need help quitting, ask your health care provider.  Control any long-term (chronic) conditions you have, such as high cholesterol or diabetes.  Identify your sources of stress and find ways to manage stress. This may include meditation, deep breathing, or making time for fun activities.   Alcohol use  Do not drink  alcohol if: ? Your health care provider tells you not to drink. ? You are pregnant, may be pregnant, or are planning to become pregnant.  If you drink alcohol: ? Limit how much you use to:  0-1 drink a day for women.  0-2 drinks a day for men. ? Be aware of how much alcohol is in your drink. In the [place] one drink equals one 12 oz bottle of beer (355 mL), one 5 oz glass of wine (148 mL), or one 1 oz glass of hard liquor (44 mL). Medicines Your health care provider may prescribe medicine if lifestyle changes are not enough to get your blood pressure under control and if:  Your systolic blood pressure is 130 or higher.  Your diastolic blood pressure is 80 or higher. Take medicines only as told by your health care provider. Follow the directions carefully. Blood pressure medicines must be taken as told by your health care provider. The medicine does not work as well when you skip doses. Skipping doses also puts you at risk for problems. Monitoring Before you monitor your blood pressure:  Do not smoke, drink caffeinated beverages, or exercise within 30 minutes before taking a  measurement.  Use the bathroom and empty your bladder (urinate).  Sit quietly for at least 5 minutes before taking measurements. Monitor your blood pressure at home as told by your health care provider. To do this:  Sit with your back straight and supported.  Place your feet flat on the floor. Do not cross your legs.  Support your arm on a flat surface, such as a table. Make sure your upper arm is at heart level.  Each time you measure, take two or three readings one minute apart and record the results. You may also need to have your blood pressure checked regularly by your health care provider.   General information  Talk with your health care provider about your diet, exercise habits, and other lifestyle factors that may be contributing to hypertension.  Review all the medicines you take with your health care provider because there may be side effects or interactions.  Keep all visits as told by your health care provider. Your health care provider can help you create and adjust your plan for managing your high blood pressure. Where to find more information  National Heart, Lung, and Blood Institute: PopSteam.is  American Heart Association: www.heart.org Contact a health care provider if:  You think you are having a reaction to medicines you have taken.  You have repeated (recurrent) headaches.  You feel dizzy.  You have swelling in your ankles.  You have trouble with your vision. Get help right away if:  You develop a severe headache or confusion.  You have unusual weakness or numbness, or you feel faint.  You have severe pain in your chest or abdomen.  You vomit repeatedly.  You have trouble breathing. These symptoms may represent a serious problem that is an emergency. Do not wait to see if the symptoms will go away. Get medical help right away. Call your local emergency services (911 in the [place] Do not drive yourself to the  hospital. Summary  Hypertension is when the force of blood pumping through your arteries is too strong. If this condition is not controlled, it may put you at risk for serious complications.  Your personal target blood pressure may vary depending on your medical conditions, your age, and other factors. For most people, a normal blood pressure is less than 120/80.  Hypertension is managed by lifestyle changes, medicines, or both.  Lifestyle changes to help manage hypertension include losing weight, eating a healthy, low-sodium diet, exercising more, stopping smoking, and limiting alcohol. This information is not intended to replace advice given to you by your health care provider. Make sure you discuss any questions you have with your health care provider. Document Revised: 03/02/2019 Document Reviewed: 12/26/2018 Elsevier Patient Education  2021 [employer]

## 2020-07-10 ENCOUNTER — Telehealth: Payer: Self-pay | Admitting: Pharmacist

## 2020-07-10 NOTE — Progress Notes (Signed)
ERROR

## 2020-07-29 ENCOUNTER — Telehealth: Payer: Self-pay | Admitting: Pharmacist

## 2020-07-29 NOTE — Progress Notes (Addendum)
Chronic Care Management Pharmacy Assistant   Name: James Jones  MRN: [id number] DOB: 1945-05-12  Reason for Encounter: Disease State For HTN.   Conditions to be addressed/monitored: HTN, DM, HLD  Recent office visits:  None since 07/08/20  Recent consult visits:  None since 07/08/20  Hospital visits:  None since 07/08/20  Medications: Outpatient Encounter Medications as of 07/29/2020  Medication Sig   allopurinol (ZYLOPRIM) 100 MG tablet Take 1 tablet (100 mg total) by mouth daily.   amLODipine (NORVASC) 10 MG tablet Take 1 tablet (10 mg total) by mouth daily.   atenolol (TENORMIN) 50 MG tablet Take 1 tablet (50 mg total) by mouth daily.   Blood Glucose Monitoring Suppl (FREESTYLE LITE) DEVI Use to check blood sugar once daily   Cholecalciferol (VITAMIN D-3) 1000 UNITS CAPS Take by mouth daily. 2 capsules.   dapagliflozin propanediol (FARXIGA) 10 MG TABS tablet Take 1 tablet (10 mg total) by mouth daily before breakfast.   doxazosin (CARDURA) 2 MG tablet Take 1 tablet (2 mg total) by mouth daily.   ferrous sulfate 325 (65 FE) MG tablet Take 1 tablet (325 mg total) by mouth 2 (two) times daily with a meal.   fish oil-omega-3 fatty acids 1000 MG capsule Take 1,200 mg by mouth daily.    furosemide (LASIX) 20 MG tablet Take 1 tablet (20 mg total) by mouth as needed. FOR LEG SWELLING   glipiZIDE (GLUCOTROL XL) 2.5 MG 24 hr tablet TAKE 1 TABLET (2.5 MG TOTAL) BY MOUTH DAILY WITH BREAKFAST.   glucose blood (FREESTYLE LITE) test strip USE TO CHECK BLOOD SUGAR 1-2x DAILY. E11.59   Multiple Vitamin (MULTIVITAMIN WITH MINERALS) TABS Take 1 tablet by mouth daily.   Potassium 99 MG TABS Take 99 mg by mouth daily.    PRESCRIPTION MEDICATION Uses C-PAP at bedtime   rosuvastatin (CRESTOR) 20 MG tablet Take 1 tablet (20 mg total) by mouth daily.   valsartan (DIOVAN) 320 MG tablet TAKE 1 TABLET BY MOUTH EVERY DAY   No facility-administered encounter medications on file as of 07/29/2020.     Reviewed chart prior to disease state call. Spoke with patient regarding BP  Recent Office Vitals: BP Readings from Last 3 Encounters:  05/27/20 128/88  04/30/20 (!) 144/70  04/15/20 (!) 162/70   Pulse Readings from Last 3 Encounters:  05/27/20 (!) 59  04/30/20 84  04/15/20 64    Wt Readings from Last 3 Encounters:  05/27/20 211 lb 3.2 oz (95.8 kg)  04/30/20 210 lb (95.3 kg)  04/15/20 210 lb (95.3 kg)     Kidney Function Lab Results  Component Value Date/Time   CREATININE 1.76 (H) 04/11/2020 09:28 AM   CREATININE 1.71 (H) 04/17/2019 08:55 AM   GFRNONAA 37 (L) 04/11/2020 09:28 AM   GFRAA 43 (L) 04/11/2020 09:28 AM    BMP Latest Ref Rng & Units 04/11/2020 04/17/2019 02/19/2019  Glucose 65 - 99 mg/dL 364(W) 803(O) 122(Q)  BUN 7 - 25 mg/dL 23 18 24   Creatinine 0.70 - 1.18 mg/dL ) 8.25(O) 0.37(C)  BUN/Creat Ratio 6 - 22 (calc) 13 11 15   Sodium 135 - 146 mmol/L 138 141 141  Potassium 3.5 - 5.3 mmol/L 4.2 4.0 4.2  Chloride 98 - 110 mmol/L 106 108 107  CO2 20 - 32 mmol/L 19(L) 22 21  Calcium 8.6 - 10.3 mg/dL 9.9 9.5 4.88(Q    Current antihypertensive regimen:  Amlodipine 10mg  daily Atenolol 50 mg daily Valsartan 320 mg daily Doxazosin 2mg  daily  How  often are you checking your Blood Pressure? Patient stated daily.  Current home BP readings: Patient stated his blood pressure ranges around 136-150/ 76-82.  What recent interventions/DTPs have been made by any provider to improve Blood Pressure control since last CPP Visit: None  Any recent hospitalizations or ED visits since last visit with CPP? Patient stated no.  What diet changes have been made to improve Blood Pressure Control?  Patient stated he eats a well rounded diet.   What exercise is being done to improve your Blood Pressure Control?  Patient stated he goes to the [facility] three days a week.   Adherence Review: Is the patient currently on ACE/ARB medication? Valsartan 320 mg 90 DS 07/04/20  Does the  patient have >5 day gap between last estimated fill dates? Per misc rpts, no.  Star Rating Drugs: Valsartan 320 mg 90 DS 07/04/20, Glipizide 2.5 mg 90 DS 07/05/20, Dapagliflozin 10 mg 90 DS 07/01/20, Rosuvastatin 20 mg 90 DS 07/06/20.  Follow-Up:Pharmacist Review  Hulen Luster, RMA Clinical Pharmacist Assistant [phone]  10 minutes spent in review, coordination, and documentation.  Reviewed by: Willa Frater, PharmD Clinical Pharmacist [facility] [phone]

## 2020-07-31 ENCOUNTER — Other Ambulatory Visit: Payer: Self-pay | Admitting: Family Medicine

## 2020-08-12 ENCOUNTER — Telehealth: Payer: Self-pay | Admitting: Pharmacist

## 2020-08-12 NOTE — Progress Notes (Addendum)
Chronic Care Management Pharmacy Assistant   Name: Andersson Larrabee  MRN: [id number] DOB: 02-13-1945  Reason for Encounter: Disease State  For DM   Conditions to be addressed/monitored: HTN, DM, HLD  Recent office visits:  None since 07/29/20  Recent consult visits:  None since 07/29/20  Hospital visits:  None since 07/29/20  Medications: Outpatient Encounter Medications as of 08/12/2020  Medication Sig   allopurinol (ZYLOPRIM) 100 MG tablet Take 1 tablet (100 mg total) by mouth daily.   amLODipine (NORVASC) 10 MG tablet Take 1 tablet (10 mg total) by mouth daily.   atenolol (TENORMIN) 50 MG tablet Take 1 tablet (50 mg total) by mouth daily.   Blood Glucose Monitoring Suppl (FREESTYLE LITE) DEVI Use to check blood sugar once daily   Cholecalciferol (VITAMIN D-3) 1000 UNITS CAPS Take by mouth daily. 2 capsules.   dapagliflozin propanediol (FARXIGA) 10 MG TABS tablet Take 1 tablet (10 mg total) by mouth daily before breakfast.   doxazosin (CARDURA) 2 MG tablet Take 1 tablet (2 mg total) by mouth daily.   ferrous sulfate 325 (65 FE) MG tablet Take 1 tablet (325 mg total) by mouth 2 (two) times daily with a meal.   fish oil-omega-3 fatty acids 1000 MG capsule Take 1,200 mg by mouth daily.    furosemide (LASIX) 20 MG tablet Take 1 tablet (20 mg total) by mouth as needed. FOR LEG SWELLING   glipiZIDE (GLUCOTROL XL) 2.5 MG 24 hr tablet TAKE 1 TABLET (2.5 MG TOTAL) BY MOUTH DAILY WITH BREAKFAST.   glucose blood (FREESTYLE LITE) test strip USE TO CHECK BLOOD SUGAR 1-2x DAILY. E11.59   Multiple Vitamin (MULTIVITAMIN WITH MINERALS) TABS Take 1 tablet by mouth daily.   Potassium 99 MG TABS Take 99 mg by mouth daily.    PRESCRIPTION MEDICATION Uses C-PAP at bedtime   rosuvastatin (CRESTOR) 20 MG tablet Take 1 tablet (20 mg total) by mouth daily.   valsartan (DIOVAN) 320 MG tablet TAKE 1 TABLET BY MOUTH EVERY DAY   No facility-administered encounter medications on file as of 08/12/2020.    Recent Relevant Labs: Lab Results  Component Value Date/Time   HGBA1C 7.0 (H) 04/11/2020 09:28 AM   HGBA1C 6.7 (A) 12/25/2019 10:16 AM   HGBA1C 6.3 (A) 06/19/2019 10:31 AM   HGBA1C 6.3 (H) 12/08/2018 11:23 AM   MICROALBUR 99.9 04/11/2020 09:28 AM   MICROALBUR 176.3 12/08/2018 11:23 AM    Kidney Function Lab Results  Component Value Date/Time   CREATININE 1.76 (H) 04/11/2020 09:28 AM   CREATININE 1.71 (H) 04/17/2019 08:55 AM   GFRNONAA 37 (L) 04/11/2020 09:28 AM   GFRAA 43 (L) 04/11/2020 09:28 AM    Current antihyperglycemic regimen:  Glipizide XL 2.5mg  daily Farxiga 10mg  daily  What recent interventions/DTPs have been made to improve glycemic control:  None.  Have there been any recent hospitalizations or ED visits since last visit with CPP? Patient stated no.  Patient denies hypoglycemic symptoms, including None  Patient denies hyperglycemic symptoms, including none  How often are you checking your blood sugar? Patient stated once daily  What are your blood sugars ranging?  112 08/12/20 100 08/11/20 117 08/10/20 167 08/09/20 104 08/08/20 106 08/07/20 107 08/06/20 134 08/05/20 118 08/04/20 129 08/03/20 102 08/02/20 103 08/01/20 114 07/31/20  During the week, how often does your blood glucose drop below 70? Patient stated Never  Are you checking your feet daily/regularly?  Patient stated he checks his feet regularly.   Adherence Review: Is the  patient currently on a STATIN medication? Rosuvastatin 20 mg  Is the patient currently on ACE/ARB medication? Valsartan 320 mg  Does the patient have >5 day gap between last estimated fill dates? Per misc rtps, no.  Star Rating Drugs: Valsartan 320 mg 90 DS 07/04/20, Dapagliflozin 10 mg 90 DS 07/01/20, Rosuvastatin 20 mg 90 DS 07/06/20.   Follow-Up:Pharmacist Review  Hulen Luster, RMA Clinical Pharmacist Assistant [phone]  10 minutes spent in review, coordination, and documentation.  Reviewed by: Willa Frater,  PharmD Clinical Pharmacist [facility] [phone]

## 2020-08-21 ENCOUNTER — Telehealth: Payer: Self-pay | Admitting: *Deleted

## 2020-08-21 MED ORDER — NIRMATRELVIR/RITONAVIR (PAXLOVID) TABLET (RENAL DOSING): 2.0000 | ORAL_TABLET | Freq: Two times a day (BID) | ORAL | 0 refills | Status: AC

## 2020-08-21 NOTE — Telephone Encounter (Signed)
Patient tested positive for COVID on 08/21/2020 with home test.   Sx began on 08/20/2020 and include fatigue, dizziness, chills, weakness, productive cough with white colored mucus, body aches, HA, ear pain, nausea.  Advised to continue symptom management with OTC medications: Tylenol/ Ibuprofen for fever/ body aches, Mucinex/ Delsym for cough/ chest congestion, Afrin/Sudafed/nasal saline for sinus pressure/ nasal congestion.  GFR 43. Order for Paxlovid sent to pharmacy for renal dose.Advised possible SE include nausea, diarrhea, loss of taste and hypertension.  If chest pain, shortness of breath, fever >104 that is unresponsive to antipyretics noted, or if patient is unable to tolerate fluids, advised to go to ER for evaluation.   Advised that the CDC recommends the following criteria prior to ending isolation in vaccinated persons at least 5 days since symptoms onset, then 5 additional days of masking AND 3 days fever free without antipyretics (Tylenol or Ibuprofen) AND improvement in respiratory symptoms.

## 2020-09-01 DIAGNOSIS — Z862 Personal history of diseases of the blood and blood-forming organs and certain disorders involving the immune mechanism: Secondary | ICD-10-CM | POA: Diagnosis not present

## 2020-09-01 DIAGNOSIS — D500 Iron deficiency anemia secondary to blood loss (chronic): Secondary | ICD-10-CM | POA: Diagnosis not present

## 2020-09-24 NOTE — Progress Notes (Signed)
Cardiology Office Note:    Date:  09/25/2020   ID:  James Jones, DOB Aug 22, 1945, MRN [id number]  PCP:  Donita Brooks, MD   [facility] Providers Cardiologist:  Bryan Lemma, MD     Referring MD: Donita Brooks, MD   No chief complaint on file.   History of Present Illness:    James Jones is a 75 y.o. male with a hx of CKD stage 2, non-occlusive CAD, diabetes mellitus, GERD, hyperlipidemia, hypertension, OSA on CPAP, who is here for follow-up and to re-establish cardiology care with me. He was previously a patient of Dr. Herbie Baltimore, who last saw him 05/2019 for follow-up. At that time he was doing well but complained of a lack of energy. He had a remote cardiac cath 03/2012 that revealed 60-70% RCA stenosis. He had myalgias on atorvastatin but has tolerated rosuvastatin.  Today, he states he feels not bad overall. He does not recall having chest pains prior to his previous cath. Lately, he has been having heartburn for a while now primarily after he eats. He does not feel heartburn during exertion. Oatmeal and grits especially gives him terrible heartburn. He was given pantoprazole but that did not seem to help. At home, he checks his blood pressure at home every day, and presents a log today. On average it was lower in 08/2020, but seems to be increasing lately. Six months ago he was given [place] and noticed higher blood pressures prior to 08/2020. Currently he is taking Farxiga, valsartan, and atenolol. For exercise he goes to the [facility] 3 days a week, and walks 6 miles. He used to work as a Nurse, children's. Typically he tries to watch his diet and sometimes avoids salt. Only very seldom will he need to take Lasix. He denies any palpitations, chest pain, or shortness of breath. No lightheadedness, headaches, syncope, orthopnea, or PND. Also has no lower extremity edema or exertional symptoms.    Past Medical History:  Diagnosis Date   CKD (chronic kidney disease) stage 2,  GFR 60-89 ml/min 03/17/2012   Coronary artery disease, non-occlusive 03/18/2012   60-70% stenosis in RCA -- FFR 0.84; EF 60-65%   Diabetes mellitus without complication (HCC)    GERD (gastroesophageal reflux disease)    On PPI   History of GI bleed January 2015   No obvious findings on EGD/colonoscopy   Hydrocele, bilateral    large on [place] 2020   Hyperlipidemia    Hypertension    OSA (obstructive sleep apnea) 03/18/2012   Doing better with CPAP   Pulmonary hypertension (HCC) 12/10/2010   ECHO:  Mild PH,mild LVH; PA pressures estimated 30-40 mmHg   RBBB, 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   Event monitor; usually during sleeping hours; therefore not on beta blocker    Past Surgical History:  Procedure Laterality Date   APPENDECTOMY  1974   CARPAL TUNNEL RELEASE     EYE SURGERY  1960   Left eye   HEMORRHOID SURGERY     LEFT HEART CATH AND CORONARY ANGIOGRAPHY  03/27/2010   Moderate mid RCA lesion,right radial approach,normal EF   LEFT HEART CATHETERIZATION WITH CORONARY ANGIOGRAM N/A 03/17/2012   Procedure: LEFT HEART CATHETERIZATION WITH CORONARY ANGIOGRAM;  Surgeon: Marykay Lex, MD;  Location: [facility] CATH LAB;  Service: Cardiovascular: Fractional Flow Reserve Measurement of mid RCA 60-70% stenosis = 0.84. Otherwise mild LCA CAD.  Normal EF & EDP   SHOULDER ARTHROSCOPY  2006   TRANSTHORACIC ECHOCARDIOGRAM  12/2010    Mild concentric LVH.  EF> 55%.  GR 1 DD.  Mild aortic sclerosis no stenosis.  PA pressures estimated 30-40 mmHg    Current Medications: Current Meds  Medication Sig   allopurinol (ZYLOPRIM) 100 MG tablet Take 1 tablet (100 mg total) by mouth daily.   amLODipine (NORVASC) 10 MG tablet Take 1 tablet (10 mg total) by mouth daily.   Blood Glucose Monitoring Suppl (FREESTYLE LITE) DEVI Use to check blood sugar once daily   carvedilol (COREG) 25 MG tablet Take 1 tablet (25 mg total) by mouth 2 (two) times daily.   Cholecalciferol (VITAMIN D-3) 1000 UNITS CAPS  Take by mouth daily. 2 capsules.   dapagliflozin propanediol (FARXIGA) 10 MG TABS tablet Take 1 tablet (10 mg total) by mouth daily before breakfast.   ferrous sulfate 325 (65 FE) MG tablet Take 1 tablet (325 mg total) by mouth 2 (two) times daily with a meal.   fish oil-omega-3 fatty acids 1000 MG capsule Take 1,200 mg by mouth daily.    furosemide (LASIX) 20 MG tablet Take 1 tablet (20 mg total) by mouth as needed. FOR LEG SWELLING   glipiZIDE (GLUCOTROL XL) 2.5 MG 24 hr tablet TAKE 1 TABLET (2.5 MG TOTAL) BY MOUTH DAILY WITH BREAKFAST.   glucose blood (FREESTYLE LITE) test strip USE TO CHECK BLOOD SUGAR 1-2x DAILY. E11.59   Multiple Vitamin (MULTIVITAMIN WITH MINERALS) TABS Take 1 tablet by mouth daily.   Potassium 99 MG TABS Take 99 mg by mouth daily.    PRESCRIPTION MEDICATION Uses C-PAP at bedtime   valsartan (DIOVAN) 320 MG tablet TAKE 1 TABLET BY MOUTH EVERY DAY   [DISCONTINUED] atenolol (TENORMIN) 50 MG tablet Take 1 tablet (50 mg total) by mouth daily.   [DISCONTINUED] doxazosin (CARDURA) 2 MG tablet Take 1 tablet (2 mg total) by mouth daily.   [DISCONTINUED] rosuvastatin (CRESTOR) 20 MG tablet Take 1 tablet (20 mg total) by mouth daily.     Allergies:   Atorvastatin   Social History   Socioeconomic History   Marital status: Married    Spouse name: Not on file   Number of children: Not on file   Years of education: Not on file   Highest education level: Not on file  Occupational History   Not on file  Tobacco Use   Smoking status: Former    Packs/day: 1.00    Types: Cigarettes    Quit date: 04/12/2012    Years since quitting: 8.4   Smokeless tobacco: Never  Substance and Sexual Activity   Alcohol use: Yes    Alcohol/week: 1.0 standard drink    Types: 1 Standard drinks or equivalent per week   Drug use: Yes    Types: Marijuana    Comment: cannibus   Sexual activity: Not on file  Other Topics Concern   Not on file  Social History Narrative   Married, father of 2,  grandfather of 3.   He is a former smoker of about a pack to pack and half cigarettes a day -- he quit in March of this year.    He is an avid exerciser working at least 4-5 days a week doing her walking or stationary bike.   Social Determinants of Health   Financial Resource Strain: Not on file  Food Insecurity: Not on file  Transportation Needs: Not on file  Physical Activity: Not on file  Stress: Not on file  Social Connections: Not on file     Family  History: The patient's family history includes Coronary artery disease in his father; Hypertension in his mother and sister; Kidney failure in his mother and sister; Stroke in his maternal grandmother.  ROS:   Please see the history of present illness.    (+) Heartburn All other systems reviewed and are negative.  EKGs/Labs/Other Studies Reviewed:    The following studies were reviewed today: No recent CV studies.  EKG:   09/25/2020: Sinus rhythm. Rate 62 bpm. RBBB.  Recent Labs: 04/11/2020: ALT 20; BUN 23; Creat 1.76; Hemoglobin 14.8; Platelets 304; Potassium 4.2; Sodium 138  Recent Lipid Panel    Component Value Date/Time   CHOL 167 04/11/2020 0928   CHOL 231 (H) 07/26/2019 0736   TRIG 118 04/11/2020 0928   HDL 54 04/11/2020 0928   HDL 58 07/26/2019 0736   CHOLHDL 3.1 04/11/2020 0928   VLDL 19 04/08/2016 0828   LDLCALC 92 04/11/2020 0928     Risk Assessment/Calculations:           Physical Exam:    Wt Readings from Last 3 Encounters:  09/25/20 210 lb 3.2 oz (95.3 kg)  05/27/20 211 lb 3.2 oz (95.8 kg)  04/30/20 210 lb (95.3 kg)   VS:  BP (!) 142/66 (BP Location: Right Arm, Patient Position: Sitting)   Ht 5\' 8"  (1.727 m)   Wt 210 lb 3.2 oz (95.3 kg)   BMI 31.96 kg/m  , BMI Body mass index is 31.96 kg/m. GENERAL:  Well appearing HEENT: Pupils equal round and reactive, fundi not visualized, oral mucosa unremarkable NECK:  No jugular venous distention, waveform within normal limits, carotid upstroke brisk  and symmetric, no bruits, no thyromegaly LYMPHATICS:  No cervical adenopathy LUNGS:  Clear to auscultation bilaterally HEART:  RRR.  PMI not displaced or sustained,S1 and S2 within normal limits, no S3, no S4, no clicks, no rubs, 3/6 systolic murmur at LUSB ABD:  Flat, positive bowel sounds normal in frequency in pitch, no bruits, no rebound, no guarding, no midline pulsatile mass, no hepatomegaly, no splenomegaly EXT:  2 plus pulses throughout, no edema, no cyanosis no clubbing SKIN:  No rashes no nodules NEURO:  Cranial nerves II through XII grossly intact, motor grossly intact throughout PSYCH:  Cognitively intact, oriented to person place and time   ASSESSMENT:    1. Murmur   2. Senile calcific aortic valve sclerosis   3. Essential hypertension   4. OSA on CPAP   5. CAD (coronary artery disease), with 60-70% stenosis in RCA   6. Dyslipidemia, goal LDL below 70   7. Therapeutic drug monitoring    PLAN:    In order of problems listed above: Senile calcific aortic valve sclerosis 3 out of 6 systolic murmur noted on exam.  This is been here historically, but he has not had an echo in 9 years.  We will repeat his echocardiogram to ensure that he has not developed aortic stenosis.  Essential hypertension Blood pressure is poorly controlled both here and at home.  Given his chronic kidney disease we will switch atenolol to carvedilol 25 mg twice a day.  We will also increase his doxazosin to 4 mg.  He will work on limiting his sodium intake and keep tracking his blood pressure at home.  Continue amlodipine and valsartan.  We will also get renal artery Dopplers and a TSH to evaluate his resistant hypertension.  He has no history of hypokalemia and has never had any adrenal adenomas on his multiple CT scans.  Therefore we will not test for hyperaldosteronism.  OSA on CPAP Continue CPAP.  CAD (coronary artery disease), with 60-70% stenosis in RCA He is asymptomatic and has no angina.  He  exercises regularly.  Continue amlodipine and switch to carvedilol.  Increase rosuvastatin to 40 mg, as his LDL goal should be less than 70.  Repeat lipids and a CMP in 2 to 3 months.  Dyslipidemia, goal LDL below 70 Increase rosuvastatin to 40 mg.  Repeat lipids and a CMP in 2 to 3 months.        Disposition: FU with APP in 2 months. FU with Ilisha Blust C. Duke Salvia, MD, [facility] in 6 months.  Medication Adjustments/Labs and Tests Ordered: Current medicines are reviewed at length with the patient today.  Concerns regarding medicines are outlined above.   Orders Placed This Encounter  Procedures   TSH   Lipid panel   Comprehensive metabolic panel   EKG 12-Lead   ECHOCARDIOGRAM COMPLETE   VAS US RENAL ARTERY DUPLEX    Meds ordered this encounter  Medications   doxazosin (CARDURA) 4 MG tablet    Sig: Take 1 tablet (4 mg total) by mouth daily.    Dispense:  90 tablet    Refill:  3    NEW DOSE, D/C 2  MG RX   rosuvastatin (CRESTOR) 40 MG tablet    Sig: Take 1 tablet (40 mg total) by mouth daily.    Dispense:  90 tablet    Refill:  3    NEW DOSE, D/C 20 MG RX PATIENT WILL CALL WHEN NEEDS FILLED   carvedilol (COREG) 25 MG tablet    Sig: Take 1 tablet (25 mg total) by mouth 2 (two) times daily.    Dispense:  180 tablet    Refill:  3    D/C ATENOLOL    Patient Instructions  Medication Instructions:  STOP ATENOLOL   START CARVEDILOL 25 MG TWICE A DAY   INCREASE DOXAZOSIN TO 4 MG DAILY   INCREASE YOUR ROSUVASTATIN TO 40 MG DAILY   *If you need a refill on your cardiac medications before your next appointment, please call your pharmacy*   Lab Work: TSH WHEN YOU GO FOR YOUR RENAL DUPLEX  FASTING LP/CMET IN 2-3 MONTHS   If you have labs (blood work) drawn today and your tests are completely normal, you will receive your results only by: MyChart Message (if you have MyChart) OR A paper copy in the mail If you have any lab test that is abnormal or we need to change your  treatment, we will call you to review the results.  Testing/Procedures: Your physician has requested that you have a renal artery duplex. During this test, an ultrasound is used to evaluate blood flow to the kidneys. Allow one hour for this exam. Do not eat after midnight the day before and avoid carbonated beverages. Take your medications as you usually do. [facility] AT [address]   Your physician has requested that you have an echocardiogram. Echocardiography is a painless test that uses sound waves to create images of your heart. It provides your doctor with information about the size and shape of your heart and how well your heart's chambers and valves are working. This procedure takes approximately one hour. There are no restrictions for this procedure.  [facility] AT [address]   Follow-Up: At [facility], you and your health needs are our priority.  As part of our  continuing mission to provide you with exceptional heart care, we have created designated Provider Care Teams.  These Care Teams include your primary Cardiologist (physician) and Advanced Practice Providers (APPs -  Physician Assistants and Nurse Practitioners) who all work together to provide you with the care you need, when you need it.  We recommend signing up for the patient portal called "MyChart".  Sign up information is provided on this After Visit Summary.  MyChart is used to connect with patients for Virtual Visits (Telemedicine).  Patients are able to view lab/test results, encounter notes, upcoming appointments, etc.  Non-urgent messages can be sent to your provider as well.   To learn more about what you can do with MyChart, go to [email or web address].    Your next appointment:   6 month(s)  The format for your next appointment:   In Person  Provider:   Chilton Si, MD  Your physician recommends that you schedule a follow-up appointment in 2 MONTHS WITH [name]  NP    I,Mathew Stumpf,acting as a scribe for Chilton Si, MD.,have documented all relevant documentation on the behalf of Chilton Si, MD,as directed by  Chilton Si, MD while in the presence of Chilton Si, MD.  I, Suellyn Meenan C. Duke Salvia, MD have reviewed all documentation for this visit.  The documentation of the exam, diagnosis, procedures, and orders on 09/25/2020 are all accurate and complete.   Signed, Chilton Si, MD  09/25/2020 10:24 AM    [facility]

## 2020-09-25 ENCOUNTER — Other Ambulatory Visit: Payer: Self-pay

## 2020-09-25 ENCOUNTER — Encounter (HOSPITAL_BASED_OUTPATIENT_CLINIC_OR_DEPARTMENT_OTHER): Payer: Self-pay | Admitting: Cardiovascular Disease

## 2020-09-25 ENCOUNTER — Ambulatory Visit (INDEPENDENT_AMBULATORY_CARE_PROVIDER_SITE_OTHER): Payer: Medicare Other | Admitting: Cardiovascular Disease

## 2020-09-25 VITALS — BP 142/66 | Ht 68.0 in | Wt 210.2 lb

## 2020-09-25 DIAGNOSIS — I2510 Atherosclerotic heart disease of native coronary artery without angina pectoris: Secondary | ICD-10-CM | POA: Diagnosis not present

## 2020-09-25 DIAGNOSIS — R011 Cardiac murmur, unspecified: Secondary | ICD-10-CM | POA: Diagnosis not present

## 2020-09-25 DIAGNOSIS — E785 Hyperlipidemia, unspecified: Secondary | ICD-10-CM | POA: Diagnosis not present

## 2020-09-25 DIAGNOSIS — Z5181 Encounter for therapeutic drug level monitoring: Secondary | ICD-10-CM | POA: Diagnosis not present

## 2020-09-25 DIAGNOSIS — I350 Nonrheumatic aortic (valve) stenosis: Secondary | ICD-10-CM | POA: Diagnosis not present

## 2020-09-25 DIAGNOSIS — G4733 Obstructive sleep apnea (adult) (pediatric): Secondary | ICD-10-CM

## 2020-09-25 DIAGNOSIS — I10 Essential (primary) hypertension: Secondary | ICD-10-CM | POA: Diagnosis not present

## 2020-09-25 DIAGNOSIS — Z9989 Dependence on other enabling machines and devices: Secondary | ICD-10-CM

## 2020-09-25 MED ORDER — ROSUVASTATIN CALCIUM 40 MG PO TABS: 40.0000 mg | ORAL_TABLET | Freq: Every day | ORAL | 3 refills | Status: DC

## 2020-09-25 MED ORDER — CARVEDILOL 25 MG PO TABS: 25.0000 mg | ORAL_TABLET | Freq: Two times a day (BID) | ORAL | 3 refills | Status: DC

## 2020-09-25 MED ORDER — DOXAZOSIN MESYLATE 4 MG PO TABS: 4.0000 mg | ORAL_TABLET | Freq: Every day | ORAL | 3 refills | Status: DC

## 2020-09-25 NOTE — Patient Instructions (Signed)
Medication Instructions:  STOP ATENOLOL   START CARVEDILOL 25 MG TWICE A DAY   INCREASE DOXAZOSIN TO 4 MG DAILY   INCREASE YOUR ROSUVASTATIN TO 40 MG DAILY   *If you need a refill on your cardiac medications before your next appointment, please call your pharmacy*   Lab Work: TSH WHEN YOU GO FOR YOUR RENAL DUPLEX  FASTING LP/CMET IN 2-3 MONTHS   If you have labs (blood work) drawn today and your tests are completely normal, you will receive your results only by: MyChart Message (if you have MyChart) OR A paper copy in the mail If you have any lab test that is abnormal or we need to change your treatment, we will call you to review the results.  Testing/Procedures: Your physician has requested that you have a renal artery duplex. During this test, an ultrasound is used to evaluate blood flow to the kidneys. Allow one hour for this exam. Do not eat after midnight the day before and avoid carbonated beverages. Take your medications as you usually do. [facility] AT [address]   Your physician has requested that you have an echocardiogram. Echocardiography is a painless test that uses sound waves to create images of your heart. It provides your doctor with information about the size and shape of your heart and how well your heart's chambers and valves are working. This procedure takes approximately one hour. There are no restrictions for this procedure.  [facility] AT [address]   Follow-Up: At [facility], you and your health needs are our priority.  As part of our continuing mission to provide you with exceptional heart care, we have created designated Provider Care Teams.  These Care Teams include your primary Cardiologist (physician) and Advanced Practice Providers (APPs -  Physician Assistants and Nurse Practitioners) who all work together to provide you with the care you need, when you need it.  We recommend signing up for the patient portal  called "MyChart".  Sign up information is provided on this After Visit Summary.  MyChart is used to connect with patients for Virtual Visits (Telemedicine).  Patients are able to view lab/test results, encounter notes, upcoming appointments, etc.  Non-urgent messages can be sent to your provider as well.   To learn more about what you can do with MyChart, go to [email or web address].    Your next appointment:   6 month(s)  The format for your next appointment:   In Person  Provider:   Chilton Si, MD  Your physician recommends that you schedule a follow-up appointment in 2 MONTHS WITH [name] NP

## 2020-09-25 NOTE — Assessment & Plan Note (Signed)
Blood pressure is poorly controlled both here and at home.  Given his chronic kidney disease we will switch atenolol to carvedilol 25 mg twice a day.  We will also increase his doxazosin to 4 mg.  He will work on limiting his sodium intake and keep tracking his blood pressure at home.  Continue amlodipine and valsartan.  We will also get renal artery Dopplers and a TSH to evaluate his resistant hypertension.  He has no history of hypokalemia and has never had any adrenal adenomas on his multiple CT scans.  Therefore we will not test for hyperaldosteronism.

## 2020-09-25 NOTE — Assessment & Plan Note (Signed)
3 out of 6 systolic murmur noted on exam.  This is been here historically, but he has not had an echo in 9 years.  We will repeat his echocardiogram to ensure that he has not developed aortic stenosis.

## 2020-09-25 NOTE — Assessment & Plan Note (Signed)
Continue CPAP.  

## 2020-09-25 NOTE — Assessment & Plan Note (Signed)
Increase rosuvastatin to 40 mg.  Repeat lipids and a CMP in 2 to 3 months.

## 2020-09-25 NOTE — Assessment & Plan Note (Signed)
He is asymptomatic and has no angina.  He exercises regularly.  Continue amlodipine and switch to carvedilol.  Increase rosuvastatin to 40 mg, as his LDL goal should be less than 70.  Repeat lipids and a CMP in 2 to 3 months.

## 2020-10-02 ENCOUNTER — Other Ambulatory Visit: Payer: Self-pay

## 2020-10-02 ENCOUNTER — Ambulatory Visit (HOSPITAL_COMMUNITY)
Admission: RE | Admit: 2020-10-02 | Discharge: 2020-10-02 | Disposition: A | Discharge status: Home / Self Care | Payer: Medicare Other | Source: Ambulatory Visit | Attending: Cardiology | Admitting: Cardiology

## 2020-10-02 DIAGNOSIS — I10 Essential (primary) hypertension: Secondary | ICD-10-CM | POA: Diagnosis not present

## 2020-10-02 LAB — TSH: TSH: 1.74 u[IU]/mL (ref 0.450–4.500)

## 2020-10-07 ENCOUNTER — Other Ambulatory Visit: Payer: Self-pay

## 2020-10-07 ENCOUNTER — Encounter: Payer: Self-pay | Admitting: Internal Medicine

## 2020-10-07 ENCOUNTER — Ambulatory Visit (INDEPENDENT_AMBULATORY_CARE_PROVIDER_SITE_OTHER): Payer: Medicare Other | Admitting: Internal Medicine

## 2020-10-07 VITALS — BP 140/78 | HR 59 | Ht 68.0 in | Wt 208.2 lb

## 2020-10-07 DIAGNOSIS — E669 Obesity, unspecified: Secondary | ICD-10-CM

## 2020-10-07 DIAGNOSIS — E1159 Type 2 diabetes mellitus with other circulatory complications: Secondary | ICD-10-CM | POA: Diagnosis not present

## 2020-10-07 DIAGNOSIS — E785 Hyperlipidemia, unspecified: Secondary | ICD-10-CM | POA: Diagnosis not present

## 2020-10-07 DIAGNOSIS — Z6830 Body mass index (BMI) 30.0-30.9, adult: Secondary | ICD-10-CM

## 2020-10-07 DIAGNOSIS — I2510 Atherosclerotic heart disease of native coronary artery without angina pectoris: Secondary | ICD-10-CM | POA: Diagnosis not present

## 2020-10-07 LAB — POCT GLYCOSYLATED HEMOGLOBIN (HGB A1C): Hemoglobin A1C: 6.6 % — AB (ref 4.0–5.6)

## 2020-10-07 NOTE — Addendum Note (Signed)
Addended by: Kenyon Ana on: 10/07/2020 09:34 AM   Modules accepted: Orders

## 2020-10-07 NOTE — Progress Notes (Signed)
Patient ID: James CanalesJames Bottoms, male   DOB: 1946/01/29, 75 y.o.   MRN: [id number]  This visit occurred during the SARS-CoV-2 public health emergency.  Safety protocols were in place, including screening questions prior to the visit, additional usage of staff PPE, and extensive cleaning of exam room while observing appropriate contact time as indicated for disinfecting solutions.   HPI: James Jones is a 75 y.o.-year-old male, returning for f/u for DM2, dx in ~2005, non-insulin-dependent, lately more controlled, with complications (CAD, CKD). Last visit 4 months ago. PCP: Dr. Lynnea FerrierWarren Pickard  Interim history: No blurry vision, nausea, chest pain.  He continues to have increased urination (which is not new). He has more heartburn lately.  Reviewed HbA1c levels: Lab Results  Component Value Date   HGBA1C 7.0 (H) 04/11/2020   HGBA1C 6.7 (A) 12/25/2019   HGBA1C 6.3 (A) 06/19/2019  04/11/2014: HbA1c 8.4% 01/08/2014: HbA1c 7.7% He had steroid inj for gout in his elbows - 12/2013. He also had a steroid inj in knee fall 2016, too.   He is on: - Glipizide ER 2.5 mg daily in a.m. - Farxiga 10 mg daily - started by PCP - started 1 mo ago - very expensive (600$ for 3 mo >> now cheaper) We had to stop Januvia since last visit due to price.  Tradjenta was also not covered. Previously on low-dose Metformin ER, but stopped 07/2018 due to CKD Had nausea, vomiting, loss of appetite with regular metformin.  He was on Actos. He was on insulin before (Lantus) - came off years ago.  Pt checks his sugars 1-2 times a day,: - am: 104-133, 144 >> 107-129, 132 >> 87-124, 141 >> 98-118, 123 - 2h after b'fast: 107 >> n/c >> 119, 160 >> n/c >> 119, 133 >> n/c - before lunch: 87 >> 86, 105 >> 119-142 >> 122, 140 > 109-148 >> 133 - 2h after lunch: n/c >> 155, 285 (Prednisone) >> n/c >> 110 >> 124-179 - before dinner:  110-148 (snack) >> 87 >> n/c >> 91, 151 >> 91 - 2h after dinner: 89, 148 >> 79, 118 >> n/c >> 107, 164  >> 118, 128 - bedtime: 123, 146 >> 89-135 >> n/c >> 109-142, 187 >> 98-139 - nighttimen/c >> 100 >> n/c  >> 122 >> n/c Lowest 54 x1 >>... 87 >> 91; he has hypoglycemia awareness in the 70s. Highest sugar was 285 (Prednisone) >> .Marland Kitchen.. 187 >> 179  Glucometer: Freestyle [employer] did not cover Freestyle Libre CGM.  Pt's meals are: - Breakfast: bacon + eggs, sausage, grits, oatmeal, cereal, toast wheat - Lunch: BLT, soups, fruit, nuts - Dinner: chicken, pork + greens, rice  - Snacks: 1 a day: pretzels; carrots  Previously exercising by walking several times a week, but stopped exercising due to the coronavirus pandemic. [facility]  -+ CKD-sees nephrology (renal ultrasound showed renal cysts), latest BUN/creatinine:  Lab Results  Component Value Date   BUN 23 04/11/2020   CREATININE 1.76 (H) 04/11/2020  05/24/2014: 34/1.64 01/08/2014: 18/1.15 On valsartan.  -+ HL; last set of lipids: Lab Results  Component Value Date   CHOL 167 04/11/2020   HDL 54 04/11/2020   LDLCALC 92 04/11/2020   TRIG 118 04/11/2020   CHOLHDL 3.1 04/11/2020  On rosuvastatin 40, increased from 20 , fish oil.  He still has some muscle aches, improved.  He had more muscle aches when he was on pravastatin 40 before.  - last eye exam was in  04/2020: No DR.  He has history of cataract surgery in 2016 and 17.  - no numbness and tingling in his feet.  Latest TSH was normal: Lab Results  Component Value Date   TSH 1.740 10/02/2020   ROS: Constitutional: no weight gain/no weight loss, no fatigue, no subjective hyperthermia, no subjective hypothermia, + increased urination Eyes: no blurry vision, no xerophthalmia ENT: no sore throat, no nodules palpated in neck, no dysphagia, no odynophagia, no hoarseness Cardiovascular: no CP/no SOB/no palpitations/no leg swelling Respiratory: no cough/no SOB/no wheezing Gastrointestinal: no N/no V/no D/no C/+ acid reflux Musculoskeletal: no muscle aches/no  joint aches Skin: no rashes, no hair loss Neurological: no tremors/no numbness/no tingling/no dizziness  I reviewed pt's medications, allergies, PMH, social hx, family hx, and changes were documented in the history of present illness. Otherwise, unchanged from my initial visit note.  Past Medical History:  Diagnosis Date   CKD (chronic kidney disease) stage 2, GFR 60-89 ml/min 03/17/2012   Coronary artery disease, non-occlusive 03/18/2012   60-70% stenosis in RCA -- FFR 0.84; EF 60-65%   Diabetes mellitus without complication (HCC)    GERD (gastroesophageal reflux disease)    On PPI   History of GI bleed January 2015   No obvious findings on EGD/colonoscopy   Hydrocele, bilateral    large on [place] 2020   Hyperlipidemia    Hypertension    OSA (obstructive sleep apnea) 03/18/2012   Doing better with CPAP   Pulmonary hypertension (HCC) 12/10/2010   ECHO:  Mild PH,mild LVH; PA pressures estimated 30-40 mmHg   RBBB, 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   Event monitor; usually during sleeping hours; therefore not on beta blocker  + Gout  Past Surgical History:  Procedure Laterality Date   APPENDECTOMY  1974   CARPAL TUNNEL RELEASE     EYE SURGERY  1960   Left eye   HEMORRHOID SURGERY     LEFT HEART CATH AND CORONARY ANGIOGRAPHY  03/27/2010   Moderate mid RCA lesion,right radial approach,normal EF   LEFT HEART CATHETERIZATION WITH CORONARY ANGIOGRAM N/A 03/17/2012   Procedure: LEFT HEART CATHETERIZATION WITH CORONARY ANGIOGRAM;  Surgeon: Marykay Lex, MD;  Location[facility] C[facility] LAB;  Service: Cardiovascular: Fractional Flow Reserve Measurement of mid RCA 60-70% stenosis = 0.84. Otherwise mild LCA CAD.  Normal EF & EDP   SHOULDER ARTHROSCOPY  2006   TRANSTHORACIC ECHOCARDIOGRAM  12/2010    Mild concentric LVH.  EF> 55%.  GR 1 DD.  Mild aortic sclerosis no stenosis.  PA pressures estimated 30-40 mmHg   History   Social History   Marital Status: Married    Spouse Name: N/A    Occupational History   retired   Social History Main Topics   Smoking status: Former Smoker -- 2.00 packs/day    Quit date: 04/12/2012   Smokeless tobacco: Never Used   Alcohol Use: 0.5 oz/week    1 drink(s) per week   Drug Use: Yes    Special: Marijuana     Comment: cannibus   Social History Narrative   Married, father of 2, grandfather of 3.   He is a former smoker of about a pack to pack and half cigarettes a day -- he quit in March of this year.    He is an avid exerciser working at least 4-5 days a week doing her walking or stationary bike.   Current Outpatient Medications on File Prior to Visit  Medication Sig Dispense Refill   allopurinol (ZYLOPRIM)  100 MG tablet Take 1 tablet (100 mg total) by mouth daily. 90 tablet 3   amLODipine (NORVASC) 10 MG tablet Take 1 tablet (10 mg total) by mouth daily. 90 tablet 3   Blood Glucose Monitoring Suppl (FREESTYLE LITE) DEVI Use to check blood sugar once daily 1 each 0   carvedilol (COREG) 25 MG tablet Take 1 tablet (25 mg total) by mouth 2 (two) times daily. 180 tablet 3   Cholecalciferol (VITAMIN D-3) 1000 UNITS CAPS Take by mouth daily. 2 capsules.     dapagliflozin propanediol (FARXIGA) 10 MG TABS tablet Take 1 tablet (10 mg total) by mouth daily before breakfast. 90 tablet 3   doxazosin (CARDURA) 4 MG tablet Take 1 tablet (4 mg total) by mouth daily. 90 tablet 3   ferrous sulfate 325 (65 FE) MG tablet Take 1 tablet (325 mg total) by mouth 2 (two) times daily with a meal. 180 tablet 3   fish oil-omega-3 fatty acids 1000 MG capsule Take 1,200 mg by mouth daily.      furosemide (LASIX) 20 MG tablet Take 1 tablet (20 mg total) by mouth as needed. FOR LEG SWELLING 90 tablet 3   glipiZIDE (GLUCOTROL XL) 2.5 MG 24 hr tablet TAKE 1 TABLET (2.5 MG TOTAL) BY MOUTH DAILY WITH BREAKFAST. 90 tablet 1   glucose blood (FREESTYLE LITE) test strip USE TO CHECK BLOOD SUGAR 1-2x DAILY. E11.59 200 strip 12   Multiple Vitamin (MULTIVITAMIN WITH  MINERALS) TABS Take 1 tablet by mouth daily.     Potassium 99 MG TABS Take 99 mg by mouth daily.      PRESCRIPTION MEDICATION Uses C-PAP at bedtime     rosuvastatin (CRESTOR) 40 MG tablet Take 1 tablet (40 mg total) by mouth daily. 90 tablet 3   valsartan (DIOVAN) 320 MG tablet TAKE 1 TABLET BY MOUTH EVERY DAY 90 tablet 0   No current facility-administered medications on file prior to visit.   Allergies  Allergen Reactions   Atorvastatin     myalgias   Family History  Problem Relation Age of Onset   Hypertension Mother    Kidney failure Mother    Coronary artery disease Father    Hypertension Sister    Kidney failure Sister    Stroke Maternal Grandmother    PE: BP 140/78 (BP Location: Right Arm, Patient Position: Sitting, Cuff Size: Normal)   Pulse (!) 59   Ht 5\' 8"  (1.727 m)   Wt 208 lb 3.2 oz (94.4 kg)   SpO2 97%   BMI 31.66 kg/m  Body mass index is 31.66 kg/m. Wt Readings from Last 3 Encounters:  10/07/20 208 lb 3.2 oz (94.4 kg)  09/25/20 210 lb 3.2 oz (95.3 kg)  05/27/20 211 lb 3.2 oz (95.8 kg)   Constitutional: overweight, in NAD Eyes: PERRLA, EOMI, no exophthalmos ENT: moist mucous membranes, no thyromegaly, no cervical lymphadenopathy Cardiovascular: RRR, No RG, + 2/6 SEM Respiratory: CTA B Gastrointestinal: abdomen soft, NT, ND, BS+ Musculoskeletal: no deformities, strength intact in all 4 Skin: moist, warm, no rashes Neurological: no tremor with outstretched hands, DTR normal in all 4  ASSESSMENT: 1. DM2, non-insulin-dependent, uncontrolled, with complications - CAD - cardiologist Dr. 05/29/20 - CKD - was seeing Dr Herbie Baltimore >> changed  2. HL  3.  Obesity class I  PLAN:  1. Patient with longstanding, previously uncontrolled type 2 diabetes, with much improved control in the last several years.  He was on a DPP 4 inhibitor but had to come off  in 2021 due to price.  He is now on low-dose sulfonylurea and also [place], added earlier this year by PCP.  Of  note, we cannot use metformin for him due to his CKD.  At last visit, she was mentioning that [name] is very expensive ($600 for 3 months), but he was hoping that the price would improve after the first payment.  At last visit, HbA1c was 7.0%.  At that time, we did not change his regimen but we discussed about cutting back on eggs and other fatty foods as much as possible to decrease his insulin resistance. -At today's visit, sugars are excellent, almost all at goal, without hyper or hypoglycemic values.  As of now, he tells me that [name] is covered and affordable, so we will continue the current regimen.  We again discussed about the importance of cutting down fatty foods. - I suggested to:  Patient Instructions  Please continue: - Glipizide XL 2.5 mg mg before breakfast - Farxiga 10 mg before breakfast  Cut back on eggs and other fatty foods as much as possible.  Please return in 4-6 months with your sugar log.   - we checked his HbA1c: 6.6% (improved) - advised to check sugars at different times of the day - 1x a day, rotating check times - advised for yearly eye exams >> he is UTD - return to clinic in 4-6 months  2. HL -Reviewed latest lipid panel from 04/2020: LDL above our target of <70, otherwise at goal: Lab Results  Component Value Date   CHOL 167 04/11/2020   HDL 54 04/11/2020   LDLCALC 92 04/11/2020   TRIG 118 04/11/2020   CHOLHDL 3.1 04/11/2020  -He had myalgias on atorvastatin and also on rosuvastatin, but he is tolerating 20 mg daily of Crestor well -At last visit I advised him to start decreasing fatty foods.  However, he continues to eat 2 eggs every day.  Discussed about changing this to every other day for now and continue to reduce the intake afterwards.  3. Obesity class I -Weight was stable at last visit, previously lost 6 pounds -He continues on the SGLT2 inhibitor, which should also help with weight loss -At last visit, I also discussed with him about  cutting down on fatty foods -he lost 3 lbs since last OV  Carlus Pavlov, MD PhD [facility] Endocrinology

## 2020-10-07 NOTE — Patient Instructions (Signed)
Please continue: - Glipizide XL 2.5 mg mg before breakfast - Farxiga 10 mg before breakfast  Cut back on eggs and other fatty foods as much as possible.  Please return in 4-6 months with your sugar log.

## 2020-10-09 ENCOUNTER — Telehealth: Payer: Self-pay

## 2020-10-09 ENCOUNTER — Ambulatory Visit (HOSPITAL_COMMUNITY): Payer: Medicare Other | Attending: Cardiology

## 2020-10-09 ENCOUNTER — Other Ambulatory Visit: Payer: Self-pay

## 2020-10-09 DIAGNOSIS — R011 Cardiac murmur, unspecified: Secondary | ICD-10-CM | POA: Diagnosis not present

## 2020-10-09 MED ORDER — PERFLUTREN LIPID MICROSPHERE
1.0000 mL | INTRAVENOUS | Status: AC | PRN
  Administered 2020-10-09: 1 mL via INTRAVENOUS

## 2020-10-10 LAB — ECHOCARDIOGRAM COMPLETE
AR max vel: 1.45 cm2
AV Area VTI: 1.39 cm2
AV Area mean vel: 1.35 cm2
AV Mean grad: 14.3 mmHg
AV Peak grad: 25.8 mmHg
Ao pk vel: 2.54 m/s
Area-P 1/2: 2.82 cm2
S' Lateral: 2.9 cm

## 2020-10-16 ENCOUNTER — Encounter: Payer: Self-pay | Admitting: Family Medicine

## 2020-10-16 ENCOUNTER — Ambulatory Visit (INDEPENDENT_AMBULATORY_CARE_PROVIDER_SITE_OTHER): Payer: Medicare Other | Admitting: Family Medicine

## 2020-10-16 ENCOUNTER — Other Ambulatory Visit: Payer: Self-pay

## 2020-10-16 VITALS — BP 124/68 | HR 60 | Temp 98.1°F | Resp 14 | Ht 68.0 in | Wt 210.0 lb

## 2020-10-16 DIAGNOSIS — I10 Essential (primary) hypertension: Secondary | ICD-10-CM

## 2020-10-16 DIAGNOSIS — N183 Chronic kidney disease, stage 3 unspecified: Secondary | ICD-10-CM

## 2020-10-16 DIAGNOSIS — N433 Hydrocele, unspecified: Secondary | ICD-10-CM | POA: Diagnosis not present

## 2020-10-16 DIAGNOSIS — K219 Gastro-esophageal reflux disease without esophagitis: Secondary | ICD-10-CM

## 2020-10-16 DIAGNOSIS — I2510 Atherosclerotic heart disease of native coronary artery without angina pectoris: Secondary | ICD-10-CM

## 2020-10-16 DIAGNOSIS — E1122 Type 2 diabetes mellitus with diabetic chronic kidney disease: Secondary | ICD-10-CM | POA: Diagnosis not present

## 2020-10-16 DIAGNOSIS — E1159 Type 2 diabetes mellitus with other circulatory complications: Secondary | ICD-10-CM | POA: Diagnosis not present

## 2020-10-16 LAB — COMPLETE METABOLIC PANEL WITH GFR
AG Ratio: 2 (calc) (ref 1.0–2.5)
ALT: 18 U/L (ref 9–46)
AST: 23 U/L (ref 10–35)
Albumin: 4.2 g/dL (ref 3.6–5.1)
Alkaline phosphatase (APISO): 84 U/L (ref 35–144)
BUN/Creatinine Ratio: 15 (calc) (ref 6–22)
BUN: 29 mg/dL — ABNORMAL HIGH (ref 7–25)
CO2: 19 mmol/L — ABNORMAL LOW (ref 20–32)
Calcium: 9.3 mg/dL (ref 8.6–10.3)
Chloride: 110 mmol/L (ref 98–110)
Creat: 1.97 mg/dL — ABNORMAL HIGH (ref 0.70–1.28)
Globulin: 2.1 g/dL (calc) (ref 1.9–3.7)
Glucose, Bld: 111 mg/dL — ABNORMAL HIGH (ref 65–99)
Potassium: 4.2 mmol/L (ref 3.5–5.3)
Sodium: 138 mmol/L (ref 135–146)
Total Bilirubin: 0.3 mg/dL (ref 0.2–1.2)
Total Protein: 6.3 g/dL (ref 6.1–8.1)
eGFR: 35 mL/min/{1.73_m2} — ABNORMAL LOW (ref >=60)

## 2020-10-16 LAB — CBC WITH DIFFERENTIAL/PLATELET
Absolute Monocytes: 544 cells/uL (ref 200–950)
Basophils Absolute: 27 cells/uL (ref 0–200)
Basophils Relative: 0.4 %
Eosinophils Absolute: 150 cells/uL (ref 15–500)
Eosinophils Relative: 2.2 %
HCT: 41.7 % (ref 38.5–50.0)
Hemoglobin: 13.4 g/dL (ref 13.2–17.1)
Lymphs Abs: 1190 cells/uL (ref 850–3900)
MCH: 27.5 pg (ref 27.0–33.0)
MCHC: 32.1 g/dL (ref 32.0–36.0)
MCV: 85.6 fL (ref 80.0–100.0)
MPV: 9.8 fL (ref 7.5–12.5)
Monocytes Relative: 8 %
Neutro Abs: 4889 cells/uL (ref 1500–7800)
Neutrophils Relative %: 71.9 %
Platelets: 220 10*3/uL (ref 140–400)
RBC: 4.87 10*6/uL (ref 4.20–5.80)
RDW: 14.8 % (ref 11.0–15.0)
Total Lymphocyte: 17.5 %
WBC: 6.8 10*3/uL (ref 3.8–10.8)

## 2020-10-16 LAB — LIPID PANEL
Cholesterol: 141 mg/dL (ref <200)
HDL: 51 mg/dL (ref >=40)
LDL Cholesterol (Calc): 74 mg/dL (calc)
Non-HDL Cholesterol (Calc): 90 mg/dL (calc) (ref <130)
Total CHOL/HDL Ratio: 2.8 (calc) (ref <5.0)
Triglycerides: 78 mg/dL (ref <150)

## 2020-10-16 MED ORDER — PANTOPRAZOLE SODIUM 40 MG PO TBEC: 40.0000 mg | DELAYED_RELEASE_TABLET | Freq: Every day | ORAL | 3 refills | Status: DC

## 2020-10-16 NOTE — Addendum Note (Signed)
Addended by: Lynnea Ferrier T on: 10/16/2020 09:59 AM   Modules accepted: Level of Service

## 2020-10-16 NOTE — Progress Notes (Addendum)
Subjective:    Patient ID: James Jones, male    DOB: 01-Dec-1945, 75 y.o.   MRN: [id number]  HPI Patient recently met with his endocrinologist and his hemoglobin A1c was 6.6!  TSH was also within normal limits.  Met with cardiology who performed an echocardiogram which did reveal moderately elevated pressures in his pulmonary artery, and ejection fraction of 60 to 65% and some mild aortic stenosis but otherwise was normal.  He denies any chest pain shortness of breath or dyspnea on exertion.  His blood pressure is 124/68.  He is due to recheck his liver function test, kidney function, fasting lipid panel.  Given his history of coronary artery disease, his goal LDL cholesterol is less than 70.  He denies any myalgias or right upper quadrant pain on his statin.  He is compliant with his aspirin and statin.  However he has been having a lot of heartburn over the last month.  He denies any melena or hematochezia.  Past Medical History:  Diagnosis Date   CKD (chronic kidney disease) stage 2, GFR 60-89 ml/min 03/17/2012   Coronary artery disease, non-occlusive 03/18/2012   60-70% stenosis in RCA -- FFR 0.84; EF 60-65%   Diabetes mellitus without complication (HCC)    GERD (gastroesophageal reflux disease)    On PPI   History of GI bleed January 2015   No obvious findings on EGD/colonoscopy   Hydrocele, bilateral    large on [place] 2020   Hyperlipidemia    Hypertension    OSA (obstructive sleep apnea) 03/18/2012   Doing better with CPAP   Pulmonary hypertension ([place]) 12/10/2010   ECHO:  Mild PH,mild LVH; PA pressures estimated 30-40 mmHg   RBBB, 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   Event monitor; usually during sleeping hours; therefore not on beta blocker   Past Surgical History:  Procedure Laterality Date   [place]   Left eye   HEMORRHOID SURGERY     LEFT HEART CATH AND CORONARY ANGIOGRAPHY  03/27/2010   Moderate mid RCA  lesion,right radial approach,normal EF   LEFT HEART CATHETERIZATION WITH CORONARY ANGIOGRAM N/A 03/17/2012   Procedure: LEFT HEART CATHETERIZATION WITH CORONARY ANGIOGRAM;  Surgeon: Leonie Man, MD;  Location: [facility] CATH LAB;  Service: Cardiovascular: Fractional Flow Reserve Measurement of mid RCA 60-70% stenosis = 0.84. Otherwise mild LCA CAD.  Normal EF & EDP   SHOULDER ARTHROSCOPY  2006   TRANSTHORACIC ECHOCARDIOGRAM  12/2010    Mild concentric LVH.  EF> 55%.  GR 1 DD.  Mild aortic sclerosis no stenosis.  PA pressures estimated 30-40 mmHg   Current Outpatient Medications on File Prior to Visit  Medication Sig Dispense Refill   allopurinol (ZYLOPRIM) 100 MG tablet Take 1 tablet (100 mg total) by mouth daily. 90 tablet 3   amLODipine (NORVASC) 10 MG tablet Take 1 tablet (10 mg total) by mouth daily. 90 tablet 3   Blood Glucose Monitoring Suppl (FREESTYLE LITE) DEVI Use to check blood sugar once daily 1 each 0   carvedilol (COREG) 25 MG tablet Take 1 tablet (25 mg total) by mouth 2 (two) times daily. 180 tablet 3   Cholecalciferol (VITAMIN D-3) 1000 UNITS CAPS Take by mouth daily. 2 capsules.     dapagliflozin propanediol (FARXIGA) 10 MG TABS tablet Take 1 tablet (10 mg total) by mouth daily before breakfast. 90 tablet 3   doxazosin (CARDURA) 4 MG tablet Take  1 tablet (4 mg total) by mouth daily. 90 tablet 3   ferrous sulfate 325 (65 FE) MG tablet Take 1 tablet (325 mg total) by mouth 2 (two) times daily with a meal. 180 tablet 3   fish oil-omega-3 fatty acids 1000 MG capsule Take 1,200 mg by mouth daily.      glipiZIDE (GLUCOTROL XL) 2.5 MG 24 hr tablet TAKE 1 TABLET (2.5 MG TOTAL) BY MOUTH DAILY WITH BREAKFAST. 90 tablet 1   glucose blood (FREESTYLE LITE) test strip USE TO CHECK BLOOD SUGAR 1-2x DAILY. E11.59 200 strip 12   Multiple Vitamin (MULTIVITAMIN WITH MINERALS) TABS Take 1 tablet by mouth daily.     Potassium 99 MG TABS Take 99 mg by mouth daily.      PRESCRIPTION MEDICATION Uses C-PAP  at bedtime     rosuvastatin (CRESTOR) 40 MG tablet Take 1 tablet (40 mg total) by mouth daily. 90 tablet 3   valsartan (DIOVAN) 320 MG tablet TAKE 1 TABLET BY MOUTH EVERY DAY 90 tablet 0   No current facility-administered medications on file prior to visit.   Allergies  Allergen Reactions   Atorvastatin     myalgias   Social History   Socioeconomic History   Marital status: Married    Spouse name: Not on file   Number of children: Not on file   Years of education: Not on file   Highest education level: Not on file  Occupational History   Not on file  Tobacco Use   Smoking status: Former    Packs/day: 1.00    Types: Cigarettes    Quit date: 04/12/2012    Years since quitting: 8.5   Smokeless tobacco: Never  Substance and Sexual Activity   Alcohol use: Yes    Alcohol/week: 1.0 standard drink    Types: 1 Standard drinks or equivalent per week   Drug use: Yes    Types: Marijuana    Comment: cannibus   Sexual activity: Not on file  Other Topics Concern   Not on file  Social History Narrative   Married, father of 2, grandfather of 3.   He is a former smoker of about a pack to pack and half cigarettes a day -- he quit in March of this year.    He is an avid exerciser working at least 4-5 days a week doing her walking or stationary bike.   Social Determinants of Health   Financial Resource Strain: Not on file  Food Insecurity: Not on file  Transportation Needs: Not on file  Physical Activity: Not on file  Stress: Not on file  Social Connections: Not on file  Intimate Partner Violence: Not on file   Family History  Problem Relation Age of Onset   Hypertension Mother    Kidney failure Mother    Coronary artery disease Father    Hypertension Sister    Kidney failure Sister    Stroke Maternal Grandmother      Review of Systems  All other systems reviewed and are negative.     Objective:   Physical Exam Vitals reviewed.  Constitutional:      General: He is not  in acute distress.    Appearance: He is well-developed. He is not diaphoretic.  HENT:     Head: Normocephalic and atraumatic.     Right Ear: External ear normal.     Left Ear: External ear normal.     Nose: Nose normal.     Mouth/Throat:  Pharynx: No oropharyngeal exudate.  Eyes:     General: No scleral icterus.       Right eye: No discharge.        Left eye: No discharge.     Conjunctiva/sclera: Conjunctivae normal.     Pupils: Pupils are equal, round, and reactive to light.  Neck:     Thyroid: No thyromegaly.     Vascular: No JVD.     Trachea: No tracheal deviation.  Cardiovascular:     Rate and Rhythm: Normal rate and regular rhythm.     Heart sounds: Murmur heard.    No friction rub. No gallop.  Pulmonary:     Effort: Pulmonary effort is normal. No respiratory distress.     Breath sounds: Normal breath sounds. No stridor. No wheezing or rales.  Chest:     Chest wall: No tenderness.  Abdominal:     General: Bowel sounds are normal. There is no distension.     Palpations: Abdomen is soft. There is no mass.     Tenderness: There is no abdominal tenderness. There is no guarding or rebound.  Musculoskeletal:        General: No tenderness or deformity. Normal range of motion.     Cervical back: Normal range of motion and neck supple.  Lymphadenopathy:     Cervical: No cervical adenopathy.  Skin:    General: Skin is warm.     Coloration: Skin is not pale.     Findings: No erythema or rash.  Neurological:     Mental Status: He is alert and oriented to person, place, and time.     Cranial Nerves: No cranial nerve deficit.     Motor: No abnormal muscle tone.     Coordination: Coordination normal.     Deep Tendon Reflexes: Reflexes are normal and symmetric.  Psychiatric:        Behavior: Behavior normal.        Thought Content: Thought content normal.        Judgment: Judgment normal.          Assessment & Plan:  Benign essential HTN - Plan: COMPLETE METABOLIC  PANEL WITH GFR, CBC with Differential/Platelet, Lipid panel  Hydrocele, unspecified hydrocele type - Plan: Ambulatory referral to Urology  Controlled type 2 diabetes mellitus with other circulatory complication, without long-term current use of insulin ([place])  CKD stage 3 due to type 2 diabetes mellitus ([place])  ASCVD (arteriosclerotic cardiovascular disease) Patient has large hydroceles that are causing him discomfort.  He would like to see urology to discuss having them repaired.  His blood pressure today is excellent at 124/68.  His last A1c within the last month was well controlled at 6.6.  I will check a fasting lipid panel.  Goal LDL cholesterol is less than 70.  Also monitor his stage III chronic kidney disease.  Recommended high-dose flu shot as well as a COVID booster as soon as they become available.  We will try the patient on Protonix 40 mg daily for heartburn and reassess in 2 to 3 weeks if not improving.  If improving, I would like him to take the medication for a month and then try discontinuing the medication

## 2020-10-17 ENCOUNTER — Other Ambulatory Visit: Payer: Self-pay | Admitting: *Deleted

## 2020-10-17 DIAGNOSIS — E1122 Type 2 diabetes mellitus with diabetic chronic kidney disease: Secondary | ICD-10-CM

## 2020-11-11 DIAGNOSIS — K219 Gastro-esophageal reflux disease without esophagitis: Secondary | ICD-10-CM | POA: Diagnosis not present

## 2020-11-11 DIAGNOSIS — I129 Hypertensive chronic kidney disease with stage 1 through stage 4 chronic kidney disease, or unspecified chronic kidney disease: Secondary | ICD-10-CM | POA: Diagnosis not present

## 2020-11-11 DIAGNOSIS — N1832 Chronic kidney disease, stage 3b: Secondary | ICD-10-CM | POA: Diagnosis not present

## 2020-11-11 DIAGNOSIS — E1122 Type 2 diabetes mellitus with diabetic chronic kidney disease: Secondary | ICD-10-CM | POA: Diagnosis not present

## 2020-11-11 DIAGNOSIS — I272 Pulmonary hypertension, unspecified: Secondary | ICD-10-CM | POA: Diagnosis not present

## 2020-11-11 DIAGNOSIS — R809 Proteinuria, unspecified: Secondary | ICD-10-CM | POA: Diagnosis not present

## 2020-11-19 ENCOUNTER — Encounter: Payer: Self-pay | Admitting: Cardiovascular Disease

## 2020-11-19 ENCOUNTER — Other Ambulatory Visit: Payer: Self-pay

## 2020-11-19 ENCOUNTER — Ambulatory Visit (INDEPENDENT_AMBULATORY_CARE_PROVIDER_SITE_OTHER): Payer: Medicare Other | Admitting: Cardiovascular Disease

## 2020-11-19 VITALS — BP 116/60 | HR 52 | Ht 68.0 in | Wt 210.4 lb

## 2020-11-19 DIAGNOSIS — I10 Essential (primary) hypertension: Secondary | ICD-10-CM | POA: Diagnosis not present

## 2020-11-19 DIAGNOSIS — N1832 Chronic kidney disease, stage 3b: Secondary | ICD-10-CM | POA: Diagnosis not present

## 2020-11-19 DIAGNOSIS — I4510 Unspecified right bundle-branch block: Secondary | ICD-10-CM | POA: Diagnosis not present

## 2020-11-19 DIAGNOSIS — N430 Encysted hydrocele: Secondary | ICD-10-CM | POA: Diagnosis not present

## 2020-11-19 DIAGNOSIS — N401 Enlarged prostate with lower urinary tract symptoms: Secondary | ICD-10-CM | POA: Diagnosis not present

## 2020-11-19 DIAGNOSIS — R351 Nocturia: Secondary | ICD-10-CM | POA: Diagnosis not present

## 2020-11-19 DIAGNOSIS — Z125 Encounter for screening for malignant neoplasm of prostate: Secondary | ICD-10-CM | POA: Diagnosis not present

## 2020-11-19 DIAGNOSIS — I2510 Atherosclerotic heart disease of native coronary artery without angina pectoris: Secondary | ICD-10-CM

## 2020-11-19 DIAGNOSIS — G4733 Obstructive sleep apnea (adult) (pediatric): Secondary | ICD-10-CM

## 2020-11-19 DIAGNOSIS — N4341 Spermatocele of epididymis, single: Secondary | ICD-10-CM | POA: Diagnosis not present

## 2020-11-19 DIAGNOSIS — Z9989 Dependence on other enabling machines and devices: Secondary | ICD-10-CM

## 2020-11-19 DIAGNOSIS — E785 Hyperlipidemia, unspecified: Secondary | ICD-10-CM

## 2020-11-19 NOTE — Progress Notes (Signed)
Patient ID: James Jones, male   DOB: May 04, 1945, 75 y.o.   MRN: [id number]    Primary cardiology: Dr. Glenetta Hew. Primary: Jenna Luo, MD  HPI: James Jones is a 75 y.o. male who presents to the office today for a 10 month follow-up sleep clinic evaluation.   [name] originally from [place] to help. While in [place] in the 1990's  he was diagnosed with sleep apnea. He was given a CPAP unit. However, he never used CPAP. He has a history of hypertension, hyperlipidemia, diabetes mellitus, GERD, and remote tobacco use. He smoked for approximately 45 years but quit smoking in March 2000.  In February 2014  he was referred for a split-night sleep study. This confirmed severe sleep apnea. At that time he has significant daytime sleepiness with an Epworth scale of 15. AHI was 69.3 per hour and REM sleep was increased at 71.5 events per hour. He dropped his O2 saturation to 81% with REM sleep and had loud snoring. He underwent a split-night evaluation. He also was prescribed an 18 cm fixed water pressure.  Since initiating CPAP therapy, he does feel he is sleeping better he typically goes to have possibly 9:30 at night and wakes up around 5 to 5:30 in the morning. A download  from 06/21/2012 through June 2014 revealed 77% of the days with usage. He admits that he did not use it when he went on vacation. His average usage on days used to 6 hours and 2 minutes. He had 70% of days greater than 4 hours. HI was excellent at 1.4 is 18 cm water pressure.  When I last saw him in 2017 he was doing well.  He was compliant.  I saw him in September 13, 2016 at which time he was continuing to use CPAP regularly.   He typically goes to bed around 10 PM and wakes up between 5 and 6 AM.  I obtained a new download in the office today from June 16 through July 15.  This reveals usage stays at 87%, and uses greater than 4 hours at 77%.  He was averaging 6 hours 1 minute of sleep.  CPAP set pressure  was 18 cm.  AHI was excellent at 1.4.  There was no significant leak.  He states he exercises 3 days per week, typically walks up to 4 miles.  He denied any chest pain or palpitations. An Epworth scale in the office today and this endorsed at 8 with moderate chance of dozing while watching TV, sitting inactive in a public place, lying down to rest in the afternoon when circumstances persist, and slight chance of dozing as a passenger in a car for an hour without a break and will sitting quietly after lunch without alcohol.  He was seen after 3 years  in May 2021.  Over this time span, James Jones states he has continued to be exceptionally compliant with CPAP use.  He states his use is at least 95 to close to 100% of the nights.  He typically goes to bed between 10 and 11 PM and wakes up at 5:30 AM.  He is sleeping well.  His sleep is restorative.  He is unaware of breakthrough snoring.  However he still has nocturia at least 2-3 times per night. A new Epworth Sleepiness Scale score was calculated in the office and this endorsed at 5 the high chance of dozing while watching television and a moderate chance of dozing while sitting  inactive in a public place arguing against residual daytime sleepiness.  He  saw Dr. Ellyn Hack for his primary cardiology care.  He is on amlodipine 10 mg, atenolol 50 mg and valsartan 3 20 mg for hypertension.  Remotely he was on Crestor and apparently at that time he was on pravastatin for lipid management.  Of note, LDL cholesterol was elevated at 141 on April 17, 2019 with total cholesterol 223.  He is diabetic on glipizide.  With his LDL increase, he was subsequently started back on rosuvastatin 20 mg in place of pravastatin.  I last saw him on October 08, 2019.  Over the previous months he had continued to do well.  He was planning to reestablish his cardiology care with Dr. Oval Linsey.  When I saw him in May 2021, he qualified for a new ResMed air sense 10 CPAP auto unit.  He received  a new machine.  A new download was obtained in the office today from July 31 through October 07, 2019 which showed 100% compliance with average usage at 6 hours and 41 minutes. His AutoSet unit minimum setting was 5 up to a maximum of 20.  95th percentile pressure was 14.5 with a maximum average pressure of 16.9.  AHI calculated at 4.0.  He continued to be on amlodipine 10 mg, atenolol 50 mg, furosemide as needed, and valsartan 320 mg daily for hypertension.  He is diabetic on glipizide.  He continued to be on rosuvastatin and has not had follow-up laboratory since this had been reinstituted.  Since I last saw him, he has continued to be reliable and compliant with CPAP use.  He has establish his cardiology care with Dr. Oval Linsey.  His blood pressure has been stable on a regimen consisting of amlodipine 10 mg, carvedilol 25 mg twice a day, doxazosin 4 mg daily, valsartan 320 mg.  He is diabetic on Farxiga 10 mg daily in addition to glipizide.  He is on pantoprazole for GERD and takes allopurinol for history of gout.  Obtained a new download of his CPAP therapy from October 20, 2020 through November 18, 2020.  Compliance is excellent.  Average sleep duration is 6 hours and 44 minutes.  He typically goes to bed at 11 PM and wakes up at 6 AM.  His AutoSet unit is set at a minimum pressure of 8 with maximum of 20 and his AHI is 2.5.  He is sleeping well.  He denies breakthrough snoring.  He still wakes up several times per night for nocturia.  Due to progressive increase in creatinine, he was referred by Dr. Dennard Schaumann to [facility] and had seen a nephrologist whose name at present he could not remember.  He presents for yearly sleep evaluation.   Past Medical History:  Diagnosis Date   CKD (chronic kidney disease) stage 2, GFR 60-89 ml/min 03/17/2012   Coronary artery disease, non-occlusive 03/18/2012   60-70% stenosis in RCA -- FFR 0.84; EF 60-65%   Diabetes mellitus without complication (HCC)    GERD  (gastroesophageal reflux disease)    On PPI   History of GI bleed January 2015   No obvious findings on EGD/colonoscopy   Hydrocele, bilateral    large on [place] 2020   Hyperlipidemia    Hypertension    OSA (obstructive sleep apnea) 03/18/2012   Doing better with CPAP   Pulmonary hypertension ([place]) 12/10/2010   ECHO:  Mild PH,mild LVH; PA pressures estimated 30-40 mmHg   RBBB, 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   Wenckebach  3/2-05/09/2012   Event monitor; usually during sleeping hours; therefore not on beta blocker    Past Surgical History:  Procedure Laterality Date   [place]   Left eye   HEMORRHOID SURGERY     LEFT HEART CATH AND CORONARY ANGIOGRAPHY  03/27/2010   Moderate mid RCA lesion,right radial approach,normal EF   LEFT HEART CATHETERIZATION WITH CORONARY ANGIOGRAM N/A 03/17/2012   Procedure: LEFT HEART CATHETERIZATION WITH CORONARY ANGIOGRAM;  Surgeon: Leonie Man, MD;  Location: [facility] CATH LAB;  Service: Cardiovascular: Fractional Flow Reserve Measurement of mid RCA 60-70% stenosis = 0.84. Otherwise mild LCA CAD.  Normal EF & EDP   SHOULDER ARTHROSCOPY  2006   TRANSTHORACIC ECHOCARDIOGRAM  12/2010    Mild concentric LVH.  EF> 55%.  GR 1 DD.  Mild aortic sclerosis no stenosis.  PA pressures estimated 30-40 mmHg    Allergies  Allergen Reactions   Atorvastatin     myalgias    Current Outpatient Medications  Medication Sig Dispense Refill   allopurinol (ZYLOPRIM) 100 MG tablet Take 1 tablet (100 mg total) by mouth daily. 90 tablet 3   amLODipine (NORVASC) 10 MG tablet Take 1 tablet (10 mg total) by mouth daily. 90 tablet 3   Blood Glucose Monitoring Suppl (FREESTYLE LITE) DEVI Use to check blood sugar once daily 1 each 0   carvedilol (COREG) 25 MG tablet Take 1 tablet (25 mg total) by mouth 2 (two) times daily. 180 tablet 3   Cholecalciferol (VITAMIN D-3) 1000 UNITS CAPS Take by mouth daily. 2 capsules.     dapagliflozin propanediol  (FARXIGA) 10 MG TABS tablet Take 1 tablet (10 mg total) by mouth daily before breakfast. 90 tablet 3   doxazosin (CARDURA) 4 MG tablet Take 1 tablet (4 mg total) by mouth daily. 90 tablet 3   ferrous sulfate 325 (65 FE) MG tablet Take 1 tablet (325 mg total) by mouth 2 (two) times daily with a meal. 180 tablet 3   fish oil-omega-3 fatty acids 1000 MG capsule Take 1,200 mg by mouth daily.      glipiZIDE (GLUCOTROL XL) 2.5 MG 24 hr tablet TAKE 1 TABLET (2.5 MG TOTAL) BY MOUTH DAILY WITH BREAKFAST. 90 tablet 1   glucose blood (FREESTYLE LITE) test strip USE TO CHECK BLOOD SUGAR 1-2x DAILY. E11.59 200 strip 12   Multiple Vitamin (MULTIVITAMIN WITH MINERALS) TABS Take 1 tablet by mouth daily.     pantoprazole (PROTONIX) 40 MG tablet Take 1 tablet (40 mg total) by mouth daily. 30 tablet 3   Potassium 99 MG TABS Take 99 mg by mouth daily.      PRESCRIPTION MEDICATION Uses C-PAP at bedtime     rosuvastatin (CRESTOR) 40 MG tablet Take 1 tablet (40 mg total) by mouth daily. 90 tablet 3   valsartan (DIOVAN) 320 MG tablet TAKE 1 TABLET BY MOUTH EVERY DAY 90 tablet 0   No current facility-administered medications for this visit.    Socially He is from [place]; he is has 2 children 3 grandchildren. It smoked for over 40 years and quit smoking in March 2014. Does drink occasional alcohol. He does exercise. He is originally from [place]. He is looking  area for 4 years  ROS General: Negative; No fevers, chills, or night sweats;  HEENT: Negative; No changes in vision or hearing, sinus congestion, difficulty swallowing Pulmonary: Negative; No cough, wheezing, shortness of breath,  hemoptysis Cardiovascular: Positive for hypertension, negative for chest pain PND orthopnea GI: Negative; No nausea, vomiting, diarrhea, or abdominal pain GU: Positive for urination at least 2 times per night. Musculoskeletal: Negative; no myalgias, joint pain, or weakness History of  gout Hematologic/Oncology: Negative; no easy bruising, bleeding Endocrine: Negative; no heat/cold intolerance; no diabetes Neuro: Negative; no changes in balance, headaches Skin: Negative; No rashes or skin lesions Psychiatric: Negative; No behavioral problems, depression Sleep: Positive for severe obstructive sleep apnea on CPAP therapy.  No residual snoring or daytime sleepiness, hypersomnolence, bruxism, restless legs, hypnogognic hallucinations, no cataplexy Other comprehensive 14 point system review is negative.  PE BP 116/60 (BP Location: Right Arm, Patient Position: Sitting, Cuff Size: Large)   Pulse (!) 52   Ht _0  (1.727 m)   Wt 210 lb 6.4 oz (95.4 kg)   SpO2 97%   BMI 31.99 kg/m    Repeat blood pressure was 120/70  Wt Readings from Last 3 Encounters:  11/19/20 210 lb 6.4 oz (95.4 kg)  10/16/20 210 lb (95.3 kg)  10/07/20 208 lb 3.2 oz (94.4 kg)   General: Alert, oriented, no distress.  Skin: normal turgor, no rashes, warm and dry HEENT: Normocephalic, atraumatic. Pupils equal round and reactive to light; sclera anicteric; extraocular muscles intact;  Nose without nasal septal hypertrophy Mouth/Parynx benign; Mallinpatti scale 3 Neck: No JVD, no carotid bruits; normal carotid upstroke Lungs: clear to ausculatation and percussion; no wheezing or rales Chest wall: without tenderness to palpitation Heart: PMI not displaced, RRR, s1 s2 normal, 1/6 systolic murmur, no diastolic murmur, no rubs, gallops, thrills, or heaves Abdomen: Central adiposity;  soft, nontender; no hepatosplenomehaly, BS+; abdominal aorta nontender and not dilated by palpation. Back: no CVA tenderness Pulses 2+ Musculoskeletal: full range of motion, normal strength, no joint deformities Extremities: no clubbing cyanosis or edema, Homan's sign negative  Neurologic: grossly nonfocal; Cranial nerves grossly wnl Psychologic: Normal mood and affect  November 19, 2020: ECG (independently read by me):  Sinus bradycardia 52 bpm, right bundle branch block.  No ectopy  I personally reviewed the ECG from May 16, 2019 which revealed sinus bradycardia at 56 bpm, right bundle branch block and inferior T wave abnormality.  August 2018 ECG (independently read by me): Sinus bradycardia at 54 bpm.  Right bundle-branch block with repolarization changes.  Normal intervals.  LABS:  BMP Latest Ref Rng & Units 10/16/2020 04/11/2020 04/17/2019  Glucose 65 - 99 mg/dL 111(H) 109(H) 119(H)  BUN 7 - 25 mg/dL 29(H) 23 18  Creatinine 0.70 - 1.28 mg/dL 1.97(H) 1.76(H) 1.71(H)  BUN/Creat Ratio 6 - 22 (calc) _1 Sodium 135 - 146 mmol/L 138 138 141  Potassium 3.5 - 5.3 mmol/L 4.2 4.2 4.0  Chloride 98 - 110 mmol/L 110 106 108  CO2 20 - 32 mmol/L 19(L) 19(L) 22  Calcium 8.6 - 10.3 mg/dL 9.3 9.9 9.5    Hepatic Function Latest Ref Rng & Units 10/16/2020 04/11/2020 07/26/2019  Total Protein 6.1 - 8.1 g/dL 6.3 6.5 6.9  Albumin 3.7 - 4.7 g/dL - - 4.4  AST 10 - 35 U/L _2 ALT 9 - 46 U/L _3 Alk Phosphatase 48 - 121 IU/L - - 102  Total Bilirubin 0.2 - 1.2 mg/dL 0.3 0.3 0.2  Bilirubin, Direct 0.00 - 0.40 mg/dL - - 0.08    CBC Latest Ref Rng & Units 10/16/2020 04/11/2020 04/17/2019  WBC 3.8 - 10.8 Thousand/uL 6.8 7.7 8.9  Hemoglobin 13.2 -  17.1 g/dL 13.4 14.8 12.5(L)  Hematocrit 38.5 - 50.0 % 41.7 45.8 37.9(L)  Platelets 140 - 400 Thousand/uL 220 304 263    Lab Results  Component Value Date   MCV 85.6 10/16/2020   MCV 85.6 04/11/2020   MCV 84.2 04/17/2019    Lab Results  Component Value Date   TSH 1.740 10/02/2020   Lab Results  Component Value Date   HGBA1C 6.6 (A) 10/07/2020   Lipid Panel     Component Value Date/Time   CHOL 141 10/16/2020 0916   CHOL 231 (H) 07/26/2019 0736   TRIG 78 10/16/2020 0916   HDL 51 10/16/2020 0916   HDL 58 07/26/2019 0736   CHOLHDL 2.8 10/16/2020 0916   VLDL 19 04/08/2016 0828   LDLCALC 74 10/16/2020 0916    RADIOLOGY: No results found.  IMPRESSION: 1.  OSA on CPAP   2. Essential hypertension   3. RBBB   4. Hyperlipidemia with target LDL less than 70   5. Stage 3b chronic kidney disease ([place])      ASSESSMENT AND PLAN:  [name] is a 75 year-old African-American male who will be turning 75 years in 4 days.  He  has a history of obstructive sleep apnea for over 20 years.  He never used CPAP until 2014 and commenced after his evaluation at [facility], which revealed severe obstructive sleep apnea with an AHI of 69.3/h and during REM sleep 71.5 per hour.  At that time he had significantly loud snoring, frequent nocturia, and oxygen desaturation.   His initial CPAP machine was a Adult nurse.  When I  saw him in 2018, AHI was 1.4 at 18 cm water pressure.  I have not seen him in close to 3 years.  He  continued to use CPAP with excellent compliance and admits to almost 100% compliance with a rare instance where he has not used the machine.  I discussed with him optimal sleep duration of at least 7 to 8 hours if at all possible.  His old machine from 2013 started to malfunction.   When I saw him in May 2021 he qualified for a new machine and I recommended he  obtain a new ResMed air sense 10 CPAP auto unit.  [employer] is his DME company who has bought out [employer].  When I saw him in August 2021 a  download was obtained on his new machine which initially was set at a minimum pressure of 5 and maximum pressure of 20 cm.  He is 100% compliant with average use is at 6 hours and 41 minutes.  AHI was 4.0.  There was no mask leak.  I at that time, recommended increasing his minimum pressure to start at 8 cm which should improve his AHI.  He is meeting compliance standards.  Over the year, he admits to 100% compliance with CPAP use.  On his most recent download from September 12 through November 18, 2020, he was averaging 6 hours and 44 minutes of CPAP use per night.  At his pressure range of 8 to 20 cm, AHI was 2.5.  Presently,  I will make minor adjustments to his settings.  I am changing his ramp time from 20 minutes down to 15 minutes, increasing his ramp start pressure to 6 cm of water, increasing EPR to 3, and will change his pressure range from 10 to 20 cm of water.  This should even further improve his treatment.  His blood pressure today is stable and a repeat by me was 120/70 on his multidrug regimen consisting of amlodipine 10 mg, carvedilol 25 mg twice a day, doxazosin 4 mg daily, and valsartan 320 mg.  He is now on Farxiga 10 mg in addition to glipizide for his diabetes mellitus.  He is tolerating rosuvastatin 40 mg for hyperlipidemia.  Lipid studies from October 16, 2020 showed an LDL cholesterol of 74.  Laboratory at that time revealed creatinine increasing to 1.97 and as result he was seen at [facility] for initiation of nephrology evaluation.  Presently he is doing well.  He denies chest pain PND orthopnea.  I will see him in 1 year for reevaluation or sooner as needed.    Troy Sine, MD, [facility]  11/19/2020 6:13 PM

## 2020-11-19 NOTE — Patient Instructions (Signed)
Medication Instructions:  No Changes In Medications at this time.  *If you need a refill on your cardiac medications before your next appointment, please call your pharmacy*  Follow-Up: At [facility], you and your health needs are our priority.  As part of our continuing mission to provide you with exceptional heart care, we have created designated Provider Care Teams.  These Care Teams include your primary Cardiologist (physician) and Advanced Practice Providers (APPs -  Physician Assistants and Nurse Practitioners) who all work together to provide you with the care you need, when you need it.  Your next appointment:   1 year(s)  The format for your next appointment:   In Person  Provider:   Thomas Kelly, MD 

## 2020-11-25 ENCOUNTER — Ambulatory Visit (INDEPENDENT_AMBULATORY_CARE_PROVIDER_SITE_OTHER): Payer: Medicare Other | Admitting: Family

## 2020-11-25 ENCOUNTER — Other Ambulatory Visit: Payer: Self-pay

## 2020-11-25 ENCOUNTER — Encounter (HOSPITAL_BASED_OUTPATIENT_CLINIC_OR_DEPARTMENT_OTHER): Payer: Self-pay | Admitting: Family

## 2020-11-25 VITALS — BP 130/68 | HR 51 | Ht 67.0 in | Wt 214.2 lb

## 2020-11-25 DIAGNOSIS — G4733 Obstructive sleep apnea (adult) (pediatric): Secondary | ICD-10-CM | POA: Diagnosis not present

## 2020-11-25 DIAGNOSIS — N1832 Chronic kidney disease, stage 3b: Secondary | ICD-10-CM | POA: Diagnosis not present

## 2020-11-25 DIAGNOSIS — I10 Essential (primary) hypertension: Secondary | ICD-10-CM | POA: Diagnosis not present

## 2020-11-25 DIAGNOSIS — I25118 Atherosclerotic heart disease of native coronary artery with other forms of angina pectoris: Secondary | ICD-10-CM

## 2020-11-25 DIAGNOSIS — Z9989 Dependence on other enabling machines and devices: Secondary | ICD-10-CM | POA: Diagnosis not present

## 2020-11-25 DIAGNOSIS — E785 Hyperlipidemia, unspecified: Secondary | ICD-10-CM | POA: Diagnosis not present

## 2020-11-25 DIAGNOSIS — I350 Nonrheumatic aortic (valve) stenosis: Secondary | ICD-10-CM

## 2020-11-25 NOTE — Progress Notes (Signed)
Office Visit    Patient Name: James Jones Date of Encounter: 11/25/2020  PCP:  Donita Brooks, MD   [facility]  Cardiologist:  Chilton Si, MD  Advanced Practice Provider:  No care team member to display Electrophysiologist:  None      Chief Complaint    James Jones is a 75 y.o. male with a hx of CKD stage III, nonocclusive coronary artery disease, DM2, GERD, hyperlipidemia, hypertension, OSA on CPAP, aortic stenosis presents today for hypertension follow-up  Past Medical History    Past Medical History:  Diagnosis Date   CKD (chronic kidney disease) stage 2, GFR 60-89 ml/min 03/17/2012   Coronary artery disease, non-occlusive 03/18/2012   60-70% stenosis in RCA -- FFR 0.84; EF 60-65%   Diabetes mellitus without complication (HCC)    GERD (gastroesophageal reflux disease)    On PPI   History of GI bleed January 2015   No obvious findings on EGD/colonoscopy   Hydrocele, bilateral    large on [place] 2020   Hyperlipidemia    Hypertension    OSA (obstructive sleep apnea) 03/18/2012   Doing better with CPAP   Pulmonary hypertension (HCC) 12/10/2010   ECHO:  Mild PH,mild LVH; PA pressures estimated 30-40 mmHg   RBBB, 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   Event monitor; usually during sleeping hours; therefore not on beta blocker   Past Surgical History:  Procedure Laterality Date   APPENDECTOMY  1974   CARPAL TUNNEL RELEASE     EYE SURGERY  1960   Left eye   HEMORRHOID SURGERY     LEFT HEART CATH AND CORONARY ANGIOGRAPHY  03/27/2010   Moderate mid RCA lesion,right radial approach,normal EF   LEFT HEART CATHETERIZATION WITH CORONARY ANGIOGRAM N/A 03/17/2012   Procedure: LEFT HEART CATHETERIZATION WITH CORONARY ANGIOGRAM;  Surgeon: Marykay Lex, MD;  Location: [facility] CATH LAB;  Service: Cardiovascular: Fractional Flow Reserve Measurement of mid RCA 60-70% stenosis = 0.84. Otherwise mild LCA CAD.  Normal EF & EDP   SHOULDER ARTHROSCOPY   2006   TRANSTHORACIC ECHOCARDIOGRAM  12/2010    Mild concentric LVH.  EF> 55%.  GR 1 DD.  Mild aortic sclerosis no stenosis.  PA pressures estimated 30-40 mmHg    Allergies  Allergies  Allergen Reactions   Atorvastatin     myalgias    History of Present Illness    James Jones is a 75 y.o. male with a hx of CKD stage III, nonocclusive coronary artery disease, DM2, GERD, hyperlipidemia, hypertension, OSA on CPAP, aortic stenosis.  He was last seen 09/25/2020 by Dr. Duke Salvia.  Remote cardiac catheterization February 2014 with 60 to 70% RCA stenosis.  Previous myalgias on atorvastatin but tolerates rosuvastatin.  He was last seen by Dr. Okey Dupre of 09/25/2020.  He endorsed heartburn and trialed pantoprazole without much improvement.  Blood pressure at home was increasing.  He was exercising regularly at the [facility].  Grade 3 out of 6 murmur was noted on exam and echocardiogram ordered.  Blood pressure was poorly controlled and as such atenolol changed to carvedilol and doxazosin increased.  Amlodipine and valsartan were continued.  Renal artery duplex was ordered which showed no renal artery stenosis bilaterally.  Echocardiogram 10/09/2020 with normal LVEF 60 to 65%, no R WMA, moderately elevated PASP, mild MR, mild to moderate aortic valve stenosis.  Presents today for follow-up. He checks his blood pressure at home with systolic readings around the 120s-150s. Notes it is often lower in  the doctor's office. Wonders whether his arm cuff is correct. Checks at 6am with morning medications around 10 am. Reports no shortness of breath nor dyspnea on exertion. Reports no chest pain, pressure, or tightness. No edema, orthopnea, PND. Reports no palpitations.  Still enjoys going to the [facility] 3 times per week. Eats out often but tries to make healthy choices.   EKGs/Labs/Other Studies Reviewed:   The following studies were reviewed today:  Renal artery duplex 10/02/2020  Right: No evidence of right renal artery  stenosis. RRV flow present.         Cyst(s) noted. Normal size right kidney. Abnormal right         Resistive Index. Normal cortical thickness of right kidney.  Left:  No evidence of left renal artery stenosis. LRV flow present.         Cyst(s) noted. Normal size of left kidney. Abnormal left         Resisitve Index. Normal cortical thickness of the left         kidney.  Mesenteric:  Normal Celiac artery and Superior Mesenteric artery findings. Areas of  limited  visceral study include left renal artery due to dense bowel gasocket in  this  area.   Echo 10/09/2020  1. Left ventricular ejection fraction, by estimation, is 60 to 65%. The  left ventricle has normal function. The left ventricle has no regional  wall motion abnormalities. Left ventricular diastolic parameters were  normal.   2. Right ventricular systolic function is normal. The right ventricular  size is normal. There is moderately elevated pulmonary artery systolic  pressure. The estimated right ventricular systolic pressure is 47.6 mmHg.   3. The mitral valve is normal in structure. Mild mitral valve  regurgitation. No evidence of mitral stenosis.   4. The aortic valve is tricuspid. There is moderate calcification of the  aortic valve. There is moderate thickening of the aortic valve. Aortic  valve regurgitation is not visualized. Mild to moderate aortic valve  stenosis. Aortic valve area, by VTI  measures 1.39 cm. Aortic valve mean gradient measures 14.2 mmHg. Aortic  valve Vmax measures 2.54 m/s.   5. The inferior vena cava is normal in size with greater than 50%  respiratory variability, suggesting right atrial pressure of 3 mmHg.   EKG: No EKG today  Recent Labs: 10/02/2020: TSH 1.740 10/16/2020: ALT 18; BUN 29; Creat 1.97; Hemoglobin 13.4; Platelets 220; Potassium 4.2; Sodium 138  Recent Lipid Panel    Component Value Date/Time   CHOL 141 10/16/2020 0916   CHOL 231 (H) 07/26/2019 0736   TRIG 78 10/16/2020  0916   HDL 51 10/16/2020 0916   HDL 58 07/26/2019 0736   CHOLHDL 2.8 10/16/2020 0916   VLDL 19 04/08/2016 0828   LDLCALC 74 10/16/2020 0916   Home Medications   No outpatient medications have been marked as taking for the 11/25/20 encounter (Appointment) with Alver Sorrow, NP.     Review of Systems      All other systems reviewed and are otherwise negative except as noted above.  Physical Exam    VS:  There were no vitals taken for this visit. , BMI There is no height or weight on file to calculate BMI.  Wt Readings from Last 3 Encounters:  11/19/20 210 lb 6.4 oz (95.4 kg)  10/16/20 210 lb (95.3 kg)  10/07/20 208 lb 3.2 oz (94.4 kg)     GEN: Well nourished, well developed, in no acute distress. HEENT:  normal. Neck: Supple, no JVD, carotid bruits, or masses. Cardiac: RRR, no  rubs, or gallops. Gr 3/6 systolic murmur. No clubbing, cyanosis, edema.  Radials/PT 2+ and equal bilaterally.  Respiratory:  Respirations regular and unlabored, clear to auscultation bilaterally. GI: Soft, nontender, nondistended. MS: No deformity or atrophy. Skin: Warm and dry, no rash. Neuro:  Strength and sensation are intact. Psych: Normal affect.  Assessment & Plan    Murmur / Mild to moderate AS -mild to moderate aortic stenosis by echo 10/2020. Periodic echo as clinically indicated for monitoring. No chest pain, syncope, shortness of breath. Continue optimal blood pressure control.   Hypertension - BP well controlled. Continue current antihypertensive regimen. Will bring BP cuff to next clinic visit to ensure accuracy. BP regimen includes: Valsartan 320mg  QD, Coreg 25mg  BID, Amlodipine 10mg  QD, Doxazosin 4mg  QD. Low salt diet encouraged.   Bradycardia - Asymptomatic with no lightheadedness nor dizziness. Continue current dose Coreg as it is providing optimal BP control. If notes symptoms of bradycardia plan to reduce dose.   Dm2 - Follows with endocrinology. Appreciate inclusion of  SGLT2i.  OSA on CPAP -follows with Dr. . CPAP compliance encouraged.   CAD with 60 to 70% stenosis of RCA - Stable with no anginal symptoms. No indication for ischemic evaluation.  GDMT includes Coreg, Rosuvastatin. Heart healthy diet and regular cardiovascular exercise encouraged.    Hyperlipidemia, LDL goal less than 70 - 10/16/20 LDL 74 after only 3 weeks on increased dose Crestor. Anticipate LDL now at goal. Continue Crestor 40mg  daily. Denies myalgias.    Disposition: Follow up in February 2023 as scheduled  with Dr. or APP.  Signed, Mayford Knife, NP 11/25/2020, 7:38 AM [facility]

## 2020-11-25 NOTE — Patient Instructions (Signed)
Medication Instructions:  Continue your current medications.   *If you need a refill on your cardiac medications before your next appointment, please call your pharmacy*   Lab Work: None ordered today.   Testing/Procedures: None ordered today.   Follow-Up: At [facility], you and your health needs are our priority.  As part of our continuing mission to provide you with exceptional heart care, we have created designated Provider Care Teams.  These Care Teams include your primary Cardiologist (physician) and Advanced Practice Providers (APPs -  Physician Assistants and Nurse Practitioners) who all work together to provide you with the care you need, when you need it.  We recommend signing up for the patient portal called "MyChart".  Sign up information is provided on this After Visit Summary.  MyChart is used to connect with patients for Virtual Visits (Telemedicine).  Patients are able to view lab/test results, encounter notes, upcoming appointments, etc.  Non-urgent messages can be sent to your provider as well.   To learn more about what you can do with MyChart, go to [email or web address].    Your next appointment:   As scheduled with Dr. Duke Salvia   Other Instructions  Heart Healthy Diet Recommendations: A low-salt diet is recommended. Meats should be grilled, baked, or boiled. Avoid fried foods. Focus on lean protein sources like fish or chicken with vegetables and fruits. The American Heart Association is a Chief Technology Officer!    Exercise recommendations: The American Heart Association recommends 150 minutes of moderate intensity exercise weekly. Try 30 minutes of moderate intensity exercise 4-5 times per week. This could include walking, jogging, or swimming.  Keep up the good work at the [employer]!

## 2020-12-08 DIAGNOSIS — Z23 Encounter for immunization: Secondary | ICD-10-CM | POA: Diagnosis not present

## 2020-12-15 ENCOUNTER — Other Ambulatory Visit: Payer: Self-pay | Admitting: Family Medicine

## 2020-12-25 DIAGNOSIS — Z5181 Encounter for therapeutic drug level monitoring: Secondary | ICD-10-CM | POA: Diagnosis not present

## 2020-12-25 DIAGNOSIS — I10 Essential (primary) hypertension: Secondary | ICD-10-CM | POA: Diagnosis not present

## 2020-12-25 DIAGNOSIS — E785 Hyperlipidemia, unspecified: Secondary | ICD-10-CM | POA: Diagnosis not present

## 2020-12-25 LAB — COMPREHENSIVE METABOLIC PANEL
ALT: 18 IU/L (ref 0–44)
AST: 22 IU/L (ref 0–40)
Albumin/Globulin Ratio: 2.5 — ABNORMAL HIGH (ref 1.2–2.2)
Albumin: 4.5 g/dL (ref 3.7–4.7)
Alkaline Phosphatase: 102 IU/L (ref 44–121)
BUN/Creatinine Ratio: 11 (ref 10–24)
BUN: 21 mg/dL (ref 8–27)
Bilirubin Total: 0.3 mg/dL (ref 0.0–1.2)
CO2: 20 mmol/L (ref 20–29)
Calcium: 9.3 mg/dL (ref 8.6–10.2)
Chloride: 106 mmol/L (ref 96–106)
Creatinine, Ser: 1.93 mg/dL — ABNORMAL HIGH (ref 0.76–1.27)
Globulin, Total: 1.8 g/dL (ref 1.5–4.5)
Glucose: 130 mg/dL — ABNORMAL HIGH (ref 70–99)
Potassium: 3.9 mmol/L (ref 3.5–5.2)
Sodium: 140 mmol/L (ref 134–144)
Total Protein: 6.3 g/dL (ref 6.0–8.5)
eGFR: 35 mL/min/{1.73_m2} — ABNORMAL LOW (ref >59)

## 2020-12-25 LAB — LIPID PANEL
Chol/HDL Ratio: 2.9 ratio (ref 0.0–5.0)
Cholesterol, Total: 140 mg/dL (ref 100–199)
HDL: 49 mg/dL (ref >39)
LDL Chol Calc (NIH): 67 mg/dL (ref 0–99)
Triglycerides: 138 mg/dL (ref 0–149)
VLDL Cholesterol Cal: 24 mg/dL (ref 5–40)

## 2021-01-05 ENCOUNTER — Other Ambulatory Visit: Payer: Self-pay | Admitting: Internal Medicine

## 2021-01-05 DIAGNOSIS — E1159 Type 2 diabetes mellitus with other circulatory complications: Secondary | ICD-10-CM

## 2021-01-20 ENCOUNTER — Telehealth: Payer: Self-pay | Admitting: Pharmacist

## 2021-01-20 ENCOUNTER — Telehealth (HOSPITAL_BASED_OUTPATIENT_CLINIC_OR_DEPARTMENT_OTHER): Payer: Self-pay | Admitting: *Deleted

## 2021-01-20 NOTE — Progress Notes (Addendum)
Chronic Care Management Pharmacy Assistant   Name: James Jones  MRN: [id number] DOB: [birth year]   Reason for Encounter: Disease State - Diabetes Call    Recent office visits:  10/16/20 Lynnea Ferrier, MD (PCP) - Family Medicine - Labs were ordered. Referral placed to Urology - pantoprazole (PROTONIX) 40 MG tablet Take 1 tablet (40 mg total) by mouth daily prescribed.   Recent consult visits:  11/25/20 Gillian Shields, NP - Mild Aortic Stenosis - no medication changes. Follow up in February 2023 as scheduled  with Dr. Duke Salvia or APP.  11/19/20 - Nicki Guadalajara, MD  Cardiology - OSA on CPAP- EKG obtained in office. No medication changes. Follow up in 1 year.   10/07/20 Carlus Pavlov MD - Internal Medicine - Diabetes - Las were ordered. No medication changes. Follow up in 4 months.   09/25/20 Chilton Si, MD - Cardiology - Murmur - Labs were ordered. EKG and ECHO ordered. carvedilol (COREG) 25 MG tablet Take 1 tablet (25 mg total) by mouth 2 (two) times daily, doxazosin (CARDURA) 4 MG tablet Take 1 tablet (4 mg total) by mouth daily, and rosuvastatin (CRESTOR) 40 MG tablet Take 1 tablet (40 mg total) by mouth daily (INCREASE) were prescribed. Follow up in 2 months.   Hospital visits:  None in previous 6 months  Medications: Outpatient Encounter Medications as of 01/20/2021  Medication Sig   allopurinol (ZYLOPRIM) 100 MG tablet Take 1 tablet (100 mg total) by mouth daily.   amLODipine (NORVASC) 10 MG tablet Take 1 tablet (10 mg total) by mouth daily.   Blood Glucose Monitoring Suppl (FREESTYLE LITE) DEVI Use to check blood sugar once daily   carvedilol (COREG) 25 MG tablet Take 1 tablet (25 mg total) by mouth 2 (two) times daily.   Cholecalciferol (VITAMIN D-3) 1000 UNITS CAPS Take by mouth daily. 2 capsules.   dapagliflozin propanediol (FARXIGA) 10 MG TABS tablet Take 1 tablet (10 mg total) by mouth daily before breakfast.   doxazosin (CARDURA) 4 MG tablet Take 1 tablet (4 mg  total) by mouth daily.   ferrous sulfate 325 (65 FE) MG tablet Take 1 tablet (325 mg total) by mouth 2 (two) times daily with a meal.   fish oil-omega-3 fatty acids 1000 MG capsule Take 1,200 mg by mouth daily.    glipiZIDE (GLUCOTROL XL) 2.5 MG 24 hr tablet TAKE 1 TABLET BY MOUTH DAILY WITH BREAKFAST.   glucose blood (FREESTYLE LITE) test strip USE TO CHECK BLOOD SUGAR 1-2x DAILY. E11.59   Multiple Vitamin (MULTIVITAMIN WITH MINERALS) TABS Take 1 tablet by mouth daily.   pantoprazole (PROTONIX) 40 MG tablet Take 1 tablet (40 mg total) by mouth daily.   Potassium 99 MG TABS Take 99 mg by mouth daily.    PRESCRIPTION MEDICATION Uses C-PAP at bedtime   rosuvastatin (CRESTOR) 40 MG tablet Take 1 tablet (40 mg total) by mouth daily.   valsartan (DIOVAN) 320 MG tablet TAKE 1 TABLET BY MOUTH EVERY DAY   No facility-administered encounter medications on file as of 01/20/2021.    Current antihyperglycemic regimen:  Glipizide XL 2.5mg  daily Farxiga 10mg  daily   What recent interventions/DTPs have been made to improve glycemic control:  Patient reported checking his blood sugars daily.   Have there been any recent hospitalizations or ED visits since last visit with CPP?  Patient has not had nay ER visits or hospitalizations since last visit with CPP.   Patient denies hypoglycemic symptoms, including Sweaty, Shaky, Hungry, Nervous/irritable, and Vision changes  Patient denies hyperglycemic symptoms, including blurry vision, excessive thirst, fatigue, polyuria, and weakness   How often are you checking your blood sugar? Patient reported checking blood sugar   What are your blood sugars ranging?  Fasting: 98-110 Before meals:  After meals:  Bedtime:   During the week, how often does your blood glucose drop below 70?  Patient denied any blood sugar readings lower than 70.  Are you checking your feet daily/regularly? Patient reported he does check his feet regularly. He has had some  discomfort in his left foot but denies any broken skin or sores or redness. Patient will make an appt with PCP if worsens or does not improve in several days.     Adherence Review: Is the patient currently on a STATIN medication? Yes Is the patient currently on ACE/ARB medication? Yes Does the patient have >5 day gap between last estimated fill dates? No  Glipizide XL 2.5mg  daily - last filled 10/04/20 90 days  Farxiga 10mg  daily - last filled 10/16/20 90 days   Care Gaps  AWV: done 04/15/20 Colonoscopy: unknown  DM Eye Exam: due 04/28/21 DM Foot Exam:  due 04/15/21 Microalbumin: done 04/11/20 HbgAIC: done 04/11/20 (7.0) DEXA: N/A Mammogram: N/A  Star Rating Drugs: Valsartan 320 mg - last filled 09/10/20 90 days  Dapagliflozin 10 mg - last filled 10/16/20 90 days  Rosuvastatin 20 mg - last filled 11/04/20 90 days   Future Appointments  Date Time Provider Department Center  03/17/2021  9:00 AM 05/15/2021, MD [facility] None  04/02/2021  8:00 AM 04/04/2021, MD [facility]   Chilton Si, [facility] Clinical Pharmacist Assistant  [phone]

## 2021-01-20 NOTE — Telephone Encounter (Deleted)
-----   Message from Chilton Si, MD sent at 01/16/2021 12:33 PM EST ----- Cholesterol levels look great.  Kidney function is abnormal but stable.  If he doesn't have a nephrologist please refer.

## 2021-01-20 NOTE — Telephone Encounter (Signed)
Left message to call back  

## 2021-01-20 NOTE — Telephone Encounter (Signed)
-----   Message from Chilton Si, MD sent at 01/16/2021 12:33 PM EST ----- Cholesterol levels look great.  Kidney function is abnormal but stable.  If he doesn't have a nephrologist please refer.

## 2021-01-22 ENCOUNTER — Other Ambulatory Visit: Payer: Self-pay | Admitting: Family Medicine

## 2021-01-22 NOTE — Telephone Encounter (Signed)
Patient returning call.

## 2021-01-22 NOTE — Telephone Encounter (Signed)
Left message to call back  

## 2021-01-27 ENCOUNTER — Encounter: Payer: Self-pay | Admitting: Family Medicine

## 2021-01-27 DIAGNOSIS — Z862 Personal history of diseases of the blood and blood-forming organs and certain disorders involving the immune mechanism: Secondary | ICD-10-CM | POA: Insufficient documentation

## 2021-01-27 DIAGNOSIS — Z23 Encounter for immunization: Secondary | ICD-10-CM | POA: Diagnosis not present

## 2021-01-28 ENCOUNTER — Other Ambulatory Visit: Payer: Self-pay

## 2021-01-28 MED ORDER — AMLODIPINE BESYLATE 10 MG PO TABS: 10.0000 mg | ORAL_TABLET | Freq: Every day | ORAL | 3 refills | Status: DC

## 2021-02-04 NOTE — Telephone Encounter (Signed)
Patient sees Dr Malen Gauze with [facility] and has follow up 03/2021 Left message to call back

## 2021-02-16 NOTE — Telephone Encounter (Signed)
Earvin Hansen, LPN  [phone]  [id number] PM EST Back to Top    Reviewed labs with patient confirmed nephrology appointment

## 2021-03-12 ENCOUNTER — Telehealth: Payer: Self-pay | Admitting: Pharmacist

## 2021-03-12 NOTE — Progress Notes (Addendum)
Chronic Care Management Pharmacy Assistant   Name: James Jones  MRN: [id number] DOB: 09-12-1945   Reason for Encounter: Disease State - Diabetes Call     Recent office visits:  None noted.   Recent consult visits:  None noted.   Hospital visits:  None in previous 6 months  Medications: Outpatient Encounter Medications as of 03/12/2021  Medication Sig   allopurinol (ZYLOPRIM) 100 MG tablet Take 1 tablet (100 mg total) by mouth daily.   amLODipine (NORVASC) 10 MG tablet Take 1 tablet (10 mg total) by mouth daily.   Blood Glucose Monitoring Suppl (FREESTYLE LITE) DEVI Use to check blood sugar once daily   carvedilol (COREG) 25 MG tablet Take 1 tablet (25 mg total) by mouth 2 (two) times daily.   Cholecalciferol (VITAMIN D-3) 1000 UNITS CAPS Take by mouth daily. 2 capsules.   dapagliflozin propanediol (FARXIGA) 10 MG TABS tablet Take 1 tablet (10 mg total) by mouth daily before breakfast.   doxazosin (CARDURA) 4 MG tablet Take 1 tablet (4 mg total) by mouth daily.   ferrous sulfate 325 (65 FE) MG tablet Take 1 tablet (325 mg total) by mouth 2 (two) times daily with a meal.   fish oil-omega-3 fatty acids 1000 MG capsule Take 1,200 mg by mouth daily.    glipiZIDE (GLUCOTROL XL) 2.5 MG 24 hr tablet TAKE 1 TABLET BY MOUTH DAILY WITH BREAKFAST.   glucose blood (FREESTYLE LITE) test strip USE TO CHECK BLOOD SUGAR 1-2x DAILY. E11.59   Multiple Vitamin (MULTIVITAMIN WITH MINERALS) TABS Take 1 tablet by mouth daily.   pantoprazole (PROTONIX) 40 MG tablet TAKE 1 TABLET BY MOUTH EVERY DAY   Potassium 99 MG TABS Take 99 mg by mouth daily.    PRESCRIPTION MEDICATION Uses C-PAP at bedtime   rosuvastatin (CRESTOR) 40 MG tablet Take 1 tablet (40 mg total) by mouth daily.   valsartan (DIOVAN) 320 MG tablet TAKE 1 TABLET BY MOUTH EVERY DAY   No facility-administered encounter medications on file as of 03/12/2021.    Current antihyperglycemic regimen:  Glipizide XL 2.5mg  daily Farxiga 10mg   daily   What recent interventions/DTPs have been made to improve glycemic control:  Patient reported no changes in his current medication regimen.   Have there been any recent hospitalizations or ED visits since last visit with CPP?  Patient has not had any hospitalizations or ED visits since last visit with CPP.   Patient denies hypoglycemic symptoms, including Sweaty, Shaky, Hungry, Nervous/irritable, and Vision changes   Patient denies hyperglycemic symptoms, including blurry vision, excessive thirst, fatigue, polyuria, and weakness   How often are you checking your blood sugar? Patient reported checking blood sugars daily every morning.    What are your blood sugars ranging?  Fasting: 110-120 Before meals:  After meals:  Bedtime:   During the week, how often does your blood glucose drop below 70?  Patient denied any readings of 70 or less.   Are you checking your feet daily/regularly? Patient reported he is checking his feet regularly. He currently has no concerns and his foot has improved since his last assessment call.     Adherence Review: Is the patient currently on a STATIN medication? Yes Is the patient currently on ACE/ARB medication? Yes Does the patient have >5 day gap between last estimated fill dates? No   Care Gaps   AWV: done 04/15/20 Colonoscopy: unknown  DM Eye Exam: due 04/28/21 DM Foot Exam:  due 04/15/21 Microalbumin: done 04/11/20 HbgAIC: done 04/11/20 (  7.0) DEXA: N/A Mammogram: N/A  Star Rating Drugs: Valsartan 320 mg - last filled 01/27/21 90 days  Dapagliflozin 10 mg - last filled 01/07/21 90 days  Rosuvastatin 20 mg - last filled 11/04/20 90 days  Future Appointments  Date Time Provider [place]  03/17/2021  9:00 AM Philemon Kingdom, MD [facility] None  04/02/2021  8:00 AM Skeet Latch, MD [facility] [place], [place] Pharmacist Assistant  [phone]   8 minutes spent in review, coordination, and  documentation.  Reviewed by: Beverly Milch, PharmD Clinical Pharmacist [phone]

## 2021-03-17 ENCOUNTER — Other Ambulatory Visit: Payer: Self-pay

## 2021-03-17 ENCOUNTER — Encounter: Payer: Self-pay | Admitting: Internal Medicine

## 2021-03-17 ENCOUNTER — Ambulatory Visit (INDEPENDENT_AMBULATORY_CARE_PROVIDER_SITE_OTHER): Payer: Medicare Other | Admitting: Internal Medicine

## 2021-03-17 VITALS — BP 128/70 | HR 68 | Ht 67.0 in | Wt 210.6 lb

## 2021-03-17 DIAGNOSIS — E669 Obesity, unspecified: Secondary | ICD-10-CM

## 2021-03-17 DIAGNOSIS — Z6830 Body mass index (BMI) 30.0-30.9, adult: Secondary | ICD-10-CM

## 2021-03-17 DIAGNOSIS — E785 Hyperlipidemia, unspecified: Secondary | ICD-10-CM | POA: Diagnosis not present

## 2021-03-17 DIAGNOSIS — E1159 Type 2 diabetes mellitus with other circulatory complications: Secondary | ICD-10-CM | POA: Diagnosis not present

## 2021-03-17 LAB — POCT GLYCOSYLATED HEMOGLOBIN (HGB A1C): Hemoglobin A1C: 6.6 % — AB (ref 4.0–5.6)

## 2021-03-17 MED ORDER — GLIPIZIDE ER 2.5 MG PO TB24: 2.5000 mg | ORAL_TABLET | Freq: Every day | ORAL | 3 refills | Status: DC

## 2021-03-17 MED ORDER — DAPAGLIFLOZIN PROPANEDIOL 10 MG PO TABS
10.0000 mg | ORAL_TABLET | Freq: Every day | ORAL | 3 refills | Status: DC
Start: 2021-03-17 — End: 2022-04-07

## 2021-03-17 NOTE — Progress Notes (Signed)
Patient ID: James Jones, male   DOB: 05-08-1945, 76 y.o.   MRN: [id number]  This visit occurred during the SARS-CoV-2 public health emergency.  Safety protocols were in place, including screening questions prior to the visit, additional usage of staff PPE, and extensive cleaning of exam room while observing appropriate contact time as indicated for disinfecting solutions.   HPI: James Jones is a 76 y.o.-year-old male, returning for f/u for DM2, dx in ~2005, non-insulin-dependent, lately more controlled, with complications (CAD, CKD). Last visit 5 months ago. PCP: Dr. Jenna Luo  Interim history: He continues to have chronic increased urination.  No blurry vision, nausea, chest pain. His acid reflux resolved on Protonix. No steroid inj's since last OV. No gout attacks since last visit.  He does mention occasional pain in the hands and feet.  No numbness or tingling.  Reviewed HbA1c levels: Lab Results  Component Value Date   HGBA1C 6.6 (A) 10/07/2020   HGBA1C 7.0 (H) 04/11/2020   HGBA1C 6.7 (A) 12/25/2019  04/11/2014: HbA1c 8.4% 01/08/2014: HbA1c 7.7% He had steroid inj for gout in his elbows - 12/2013. He also had a steroid inj in knee fall 2016, too.   He is on: - Glipizide ER 2.5 mg daily in a.m. - Farxiga 10 mg daily - started by PCP - started 1 mo ago - very expensive (600$ for 3 mo >> now cheaper) We had to stop Januvia since last visit due to price.  Tradjenta was also not covered. Previously on low-dose Metformin ER, but stopped 07/2018 due to CKD Had nausea, vomiting, loss of appetite with regular metformin.  He was on Actos. He was on insulin before (Lantus) - came off years ago.  Pt checks his sugars 1-2 times a day,: - am: 107-129, 132 >> 87-124, 141 >> 98-118, 123 >> 97-131, 136 (most 100-120) - 2h after b'fast: 119, 160 >> n/c >> 119, 133 >> n/c >> 118 - before lunch: 119-142 >> 122, 140 > 109-148 >> 133 >> 154 - 2h after lunch: 155, 285 (steroid) >> n/c >> 110  >> 124-179 >> 99 - before dinner:  110-148 >> 87 >> n/c >> 91, 151 >> 91 >> 82 - 2h after dinner: 79, 118 >> n/c >> 107, 164 >> 118, 128 >> 112, 131 - bedtime: 89-135 >> n/c >> 109-142, 187 >> 98-139 >> 104 - nighttimen/c >> 100 >> n/c  >> 122 >> n/c Lowest 54 x1 >>... 87 >> 91 >> 82; he has hypoglycemia awareness in the 70s. Highest sugar was 285 (Prednisone) >> .Marland Kitchen. 187 >> 179 >> 154  Glucometer: Freestyle [employer] did not cover Freestyle Libre CGM.  Pt's meals are: - Breakfast: bacon + eggs, sausage, grits, oatmeal, cereal, toast wheat - Lunch: BLT, soups, fruit, nuts - Dinner: chicken, pork + greens, rice  - Snacks: 1 a day: pretzels; carrots  Previously exercising by walking several times a week, but stopped exercising due to the coronavirus pandemic. Walking 6 mi MWF at the [facility].  -+ CKD-sees nephrology (renal ultrasound showed renal cysts), latest BUN/creatinine:  Lab Results  Component Value Date   BUN 21 12/25/2020   CREATININE 1.93 (H) 12/25/2020  05/24/2014: 34/1.64 01/08/2014: 18/1.15 On valsartan.  -+ HL; last set of lipids: Lab Results  Component Value Date   CHOL 140 12/25/2020   HDL 49 12/25/2020   LDLCALC 67 12/25/2020   TRIG 138 12/25/2020   CHOLHDL 2.9 12/25/2020  On rosuvastatin 40, increased from 20 , fish oil.  He  still has some muscle aches, improved.  He had more muscle aches when he was on pravastatin 40 before.  - last eye exam was in 04/2020: No DR.  He has history of cataract surgery in 2016 and 17.  - no numbness and tingling in his feet. + Occasional pain in hands and feet  Latest TSH was normal: Lab Results  Component Value Date   TSH 1.740 10/02/2020   ROS: + see HPI  I reviewed pt's medications, allergies, PMH, social hx, family hx, and changes were documented in the history of present illness. Otherwise, unchanged from my initial visit note.  Past Medical History:  Diagnosis Date   CKD (chronic kidney disease) stage 2, GFR 60-89  ml/min 03/17/2012   Coronary artery disease, non-occlusive 03/18/2012   60-70% stenosis in RCA -- FFR 0.84; EF 60-65%   Diabetes mellitus without complication (HCC)    GERD (gastroesophageal reflux disease)    On PPI   History of GI bleed January 2015   No obvious findings on EGD/colonoscopy   Hydrocele, bilateral    large on [place] 2020   Hyperlipidemia    Hypertension    OSA (obstructive sleep apnea) 03/18/2012   Doing better with CPAP   Pulmonary hypertension ([place]) 12/10/2010   ECHO:  Mild PH,mild LVH; PA pressures estimated 30-40 mmHg   RBBB, 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   Event monitor; usually during sleeping hours; therefore not on beta blocker  + Gout  Past Surgical History:  Procedure Laterality Date   [place]   Left eye   HEMORRHOID SURGERY     LEFT HEART CATH AND CORONARY ANGIOGRAPHY  03/27/2010   Moderate mid RCA lesion,right radial approach,normal EF   LEFT HEART CATHETERIZATION WITH CORONARY ANGIOGRAM N/A 03/17/2012   Procedure: LEFT HEART CATHETERIZATION WITH CORONARY ANGIOGRAM;  Surgeon: Leonie Man, MD;  Location: [facility] CATH LAB;  Service: Cardiovascular: Fractional Flow Reserve Measurement of mid RCA 60-70% stenosis = 0.84. Otherwise mild LCA CAD.  Normal EF & EDP   SHOULDER ARTHROSCOPY  2006   TRANSTHORACIC ECHOCARDIOGRAM  12/2010    Mild concentric LVH.  EF> 55%.  GR 1 DD.  Mild aortic sclerosis no stenosis.  PA pressures estimated 30-40 mmHg   History   Social History   Marital Status: Married    Spouse Name: N/A   Occupational History   retired   Social History Main Topics   Smoking status: Former Smoker -- 2.00 packs/day    Quit date: 04/12/2012   Smokeless tobacco: Never Used   Alcohol Use: 0.5 oz/week    1 drink(s) per week   Drug Use: Yes    Special: Marijuana     Comment: cannibus   Social History Narrative   Married, father of 2, grandfather of 3.   He is a former  smoker of about a pack to pack and half cigarettes a day -- he quit in March of this year.    He is an avid exerciser working at least 4-5 days a week doing her walking or stationary bike.   Current Outpatient Medications on File Prior to Visit  Medication Sig Dispense Refill   allopurinol (ZYLOPRIM) 100 MG tablet Take 1 tablet (100 mg total) by mouth daily. 90 tablet 3   amLODipine (NORVASC) 10 MG tablet Take 1 tablet (10 mg total) by mouth daily. 90 tablet 3   Blood Glucose Monitoring Suppl (  FREESTYLE LITE) DEVI Use to check blood sugar once daily 1 each 0   carvedilol (COREG) 25 MG tablet Take 1 tablet (25 mg total) by mouth 2 (two) times daily. 180 tablet 3   Cholecalciferol (VITAMIN D-3) 1000 UNITS CAPS Take by mouth daily. 2 capsules.     dapagliflozin propanediol (FARXIGA) 10 MG TABS tablet Take 1 tablet (10 mg total) by mouth daily before breakfast. 90 tablet 3   doxazosin (CARDURA) 4 MG tablet Take 1 tablet (4 mg total) by mouth daily. 90 tablet 3   ferrous sulfate 325 (65 FE) MG tablet Take 1 tablet (325 mg total) by mouth 2 (two) times daily with a meal. 180 tablet 3   fish oil-omega-3 fatty acids 1000 MG capsule Take 1,200 mg by mouth daily.      glipiZIDE (GLUCOTROL XL) 2.5 MG 24 hr tablet TAKE 1 TABLET BY MOUTH DAILY WITH BREAKFAST. 90 tablet 1   glucose blood (FREESTYLE LITE) test strip USE TO CHECK BLOOD SUGAR 1-2x DAILY. E11.59 200 strip 12   Multiple Vitamin (MULTIVITAMIN WITH MINERALS) TABS Take 1 tablet by mouth daily.     pantoprazole (PROTONIX) 40 MG tablet TAKE 1 TABLET BY MOUTH EVERY DAY 90 tablet 3   Potassium 99 MG TABS Take 99 mg by mouth daily.      PRESCRIPTION MEDICATION Uses C-PAP at bedtime     rosuvastatin (CRESTOR) 40 MG tablet Take 1 tablet (40 mg total) by mouth daily. 90 tablet 3   valsartan (DIOVAN) 320 MG tablet TAKE 1 TABLET BY MOUTH EVERY DAY 90 tablet 0   No current facility-administered medications on file prior to visit.   Allergies  Allergen  Reactions   Atorvastatin     myalgias   Family History  Problem Relation Age of Onset   Hypertension Mother    Kidney failure Mother    Coronary artery disease Father    Hypertension Sister    Kidney failure Sister    Stroke Maternal Grandmother    PE: BP 128/70 (BP Location: Right Arm, Patient Position: Sitting, Cuff Size: Normal)    Pulse 68    Ht 5\' 7"  (1.702 m)    Wt 210 lb 9.6 oz (95.5 kg)    SpO2 97%    BMI 32.98 kg/m   Wt Readings from Last 3 Encounters:  03/17/21 210 lb 9.6 oz (95.5 kg)  11/25/20 214 lb 3.2 oz (97.2 kg)  11/19/20 210 lb 6.4 oz (95.4 kg)   Constitutional: overweight, in NAD Eyes: PERRLA, EOMI, no exophthalmos ENT: moist mucous membranes, no thyromegaly, no cervical lymphadenopathy Cardiovascular: RRR, No RG, + 2/6 SEM Respiratory: CTA B Musculoskeletal: no deformities, strength intact in all 4 Skin: moist, warm, no rashes Neurological: no tremor with outstretched hands, DTR normal in all 4  ASSESSMENT: 1. DM2, non-insulin-dependent, uncontrolled, with complications - CAD - cardiologist Dr. Ellyn Hack - CKD - was seeing Dr Lowanda Foster >> changed  2. HL  3.  Obesity class I  PLAN:  1. Patient with longstanding, previously uncontrolled type 2 diabetes, with much improved control in the last several years.  He is currently on sulfonylurea and SGLT2 inhibitor.  He was on a DPP 4 inhibitor in the past.  We cannot use metformin for him due to his CKD.   Wilder Glade was very expensive for him previously, but at last visit he mentions that it was covered and affordable.  At last visit, sugars were excellent, almost all at goal, without hyper or hypoglycemic values.  We did not change his regimen.  HbA1c was better, at 6.6%. -At today's visit, sugars remain very well controlled, with only an occasional slightly hypoglycemic value in the morning (in the 130s, usually after coffee), and 1 slightly high blood sugar before lunch.  No lows.  For now, there is no need to change  his regimen.  I refilled his prescriptions. - I suggested to:  Patient Instructions  Please continue: - Glipizide XL 2.5 mg mg before breakfast - Farxiga 10 mg before breakfast  Please return in 6 months with your sugar log.   - we checked his HbA1c: 6.6% (stable, at goal) - advised to check sugars at different times of the day - 1x a day, rotating check times - advised for yearly eye exams >> he is UTD - UTD with foot exam.  He does not have a podiatrist. - return to clinic in 6 months  2. HL -Reviewed latest lipid panel from 12/2020: LDL above our target of less than 55 due to his cardiovascular disease, the rest of the fractions at goal Lab Results  Component Value Date   CHOL 140 12/25/2020   HDL 49 12/25/2020   LDLCALC 67 12/25/2020   TRIG 138 12/25/2020   CHOLHDL 2.9 12/25/2020  -He had myalgias on atorvastatin and also on rosuvastatin, but he is tolerating 20 mg of Crestor daily well -At last visit I advised him to cut down on eggs.  He was eating 2 every day.  3. Obesity class I -He continues on SGLT2 inhibitor which should also help with weight loss -At last visit I advised him to cut down fatty foods -he lost 3 lbs before and 4 pounds since then  Philemon Kingdom, MD PhD [facility]

## 2021-03-17 NOTE — Patient Instructions (Addendum)
Please continue: - Glipizide XL 2.5 mg mg before breakfast - Farxiga 10 mg before breakfast  Please return in 6 months with your sugar log.

## 2021-03-24 DIAGNOSIS — N1832 Chronic kidney disease, stage 3b: Secondary | ICD-10-CM | POA: Diagnosis not present

## 2021-03-29 ENCOUNTER — Other Ambulatory Visit: Payer: Self-pay | Admitting: Family Medicine

## 2021-03-29 DIAGNOSIS — U071 COVID-19: Secondary | ICD-10-CM | POA: Diagnosis not present

## 2021-03-31 DIAGNOSIS — I272 Pulmonary hypertension, unspecified: Secondary | ICD-10-CM | POA: Diagnosis not present

## 2021-03-31 DIAGNOSIS — I129 Hypertensive chronic kidney disease with stage 1 through stage 4 chronic kidney disease, or unspecified chronic kidney disease: Secondary | ICD-10-CM | POA: Diagnosis not present

## 2021-03-31 DIAGNOSIS — E1122 Type 2 diabetes mellitus with diabetic chronic kidney disease: Secondary | ICD-10-CM | POA: Diagnosis not present

## 2021-03-31 DIAGNOSIS — R809 Proteinuria, unspecified: Secondary | ICD-10-CM | POA: Diagnosis not present

## 2021-03-31 DIAGNOSIS — K219 Gastro-esophageal reflux disease without esophagitis: Secondary | ICD-10-CM | POA: Diagnosis not present

## 2021-03-31 DIAGNOSIS — N1831 Chronic kidney disease, stage 3a: Secondary | ICD-10-CM | POA: Diagnosis not present

## 2021-04-01 NOTE — Progress Notes (Signed)
Cardiology Office Note:    Date:  04/02/2021   ID:  James Jones, DOB 1945/11/06, MRN [id number]  PCP:  Susy Frizzle, MD   [facility] Providers Cardiologist:  Skeet Latch, MD     Referring MD: Susy Frizzle, MD   No chief complaint on file.  History of Present Illness:    James Jones is a 76 y.o. male with a hx of mild aortic stenosis, CKD stage 2, non-occlusive CAD, diabetes mellitus, GERD, hyperlipidemia, hypertension, OSA on CPAP, who is here for follow-up. He was previously a patient of Dr. Ellyn Hack, who last saw him 05/2019 for follow-up. At that time he was doing well but complained of a lack of energy. He had a remote cardiac cath 03/2012 that revealed 60-70% RCA stenosis. He had myalgias on atorvastatin but has tolerated rosuvastatin.  At his last appointment he was feeling well overall aside from issues with frequent acid reflux.  He was noted to have a systolic murmur and was referred for an echo 10/2020 that revealed LVEF 60 to 65% with normal diastolic function.  PASP was moderately elevated at 47 mmHg.  He had mild aortic stenosis.  Mean gradient was 14.2 mmHg.  He followed up with Laurann Montana, NP 11/25/20 and his blood pressure was well controlled on valsartan, coreg, amlodipine, and doxazosin. Today, he states he is feeling not bad overall with no new complaints. He monitors his at home blood pressure daily, and has seen readings as high as 152. It was 147/78 this morning. However, he has noticed readings such as 124 later in the day. Typically he takes valsartan at night, 9-10 PM. He takes his morning doses around 10 AM. He is exercising 3 days a week by walking 6 miles in about 30 minutes. He also performs some other cardiovascular exercises. Concerning his diet he sometimes monitors his sodium intake. Until a few weeks ago, he was drinking caffeinated coffee. He has since switched to decaf. He uses his CPAP faithfully. He denies any palpitations, chest pain,  shortness of breath, or peripheral edema. No lightheadedness, headaches, syncope, orthopnea, or PND.   Past Medical History:  Diagnosis Date   Aortic stenosis 04/02/2021   CKD (chronic kidney disease) stage 2, GFR 60-89 ml/min 03/17/2012   Coronary artery disease, non-occlusive 03/18/2012   60-70% stenosis in RCA -- FFR 0.84; EF 60-65%   Diabetes mellitus without complication (HCC)    GERD (gastroesophageal reflux disease)    On PPI   History of GI bleed January 2015   No obvious findings on EGD/colonoscopy   Hydrocele, bilateral    large on [place] 2020   Hyperlipidemia    Hypertension    Obesity (BMI 30.0-34.9) 10/02/2012   OSA (obstructive sleep apnea) 03/18/2012   Doing better with CPAP   Pulmonary hypertension ([place]) 12/10/2010   ECHO:  Mild PH,mild LVH; PA pressures estimated 30-40 mmHg   RBBB, 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   Event monitor; usually during sleeping hours; therefore not on beta blocker    Past Surgical History:  Procedure Laterality Date   [place]   Left eye   HEMORRHOID SURGERY     LEFT HEART CATH AND CORONARY ANGIOGRAPHY  03/27/2010   Moderate mid RCA lesion,right radial approach,normal EF   LEFT HEART CATHETERIZATION WITH CORONARY ANGIOGRAM N/A 03/17/2012   Procedure: LEFT HEART CATHETERIZATION WITH CORONARY ANGIOGRAM;  Surgeon: Leonie Man, MD;  Location: [place] C[facility];  Service: Cardiovascular: Fractional Flow Reserve Measurement of mid RCA 60-70% stenosis = 0.84. Otherwise mild LCA CAD.  Normal EF & EDP   SHOULDER ARTHROSCOPY  2006   TRANSTHORACIC ECHOCARDIOGRAM  12/2010    Mild concentric LVH.  EF> 55%.  GR 1 DD.  Mild aortic sclerosis no stenosis.  PA pressures estimated 30-40 mmHg    Current Medications: Current Meds  Medication Sig   allopurinol (ZYLOPRIM) 100 MG tablet Take 1 tablet (100 mg total) by mouth daily.   amLODipine (NORVASC) 10 MG tablet Take 1 tablet (10 mg total)  by mouth daily.   Blood Glucose Monitoring Suppl (FREESTYLE LITE) DEVI Use to check blood sugar once daily   carvedilol (COREG) 25 MG tablet Take 1 tablet (25 mg total) by mouth 2 (two) times daily.   Cholecalciferol (VITAMIN D-3) 1000 UNITS CAPS Take by mouth daily. 2 capsules.   dapagliflozin propanediol (FARXIGA) 10 MG TABS tablet Take 1 tablet (10 mg total) by mouth daily before breakfast.   ferrous sulfate 325 (65 FE) MG tablet Take 1 tablet (325 mg total) by mouth 2 (two) times daily with a meal.   fish oil-omega-3 fatty acids 1000 MG capsule Take 1,200 mg by mouth daily.    glipiZIDE (GLUCOTROL XL) 2.5 MG 24 hr tablet Take 1 tablet (2.5 mg total) by mouth daily with breakfast.   glucose blood (FREESTYLE LITE) test strip USE TO CHECK BLOOD SUGAR 1-2x DAILY. E11.59   Multiple Vitamin (MULTIVITAMIN WITH MINERALS) TABS Take 1 tablet by mouth daily.   pantoprazole (PROTONIX) 40 MG tablet TAKE 1 TABLET BY MOUTH EVERY DAY   Potassium 99 MG TABS Take 99 mg by mouth daily.    PRESCRIPTION MEDICATION Uses C-PAP at bedtime   rosuvastatin (CRESTOR) 40 MG tablet Take 1 tablet (40 mg total) by mouth daily.   valsartan (DIOVAN) 320 MG tablet TAKE 1 TABLET BY MOUTH EVERY DAY   [DISCONTINUED] doxazosin (CARDURA) 4 MG tablet Take 1 tablet (4 mg total) by mouth daily.     Allergies:   Atorvastatin   Social History   Socioeconomic History   Marital status: Married    Spouse name: Not on file   Number of children: Not on file   Years of education: Not on file   Highest education level: Not on file  Occupational History   Not on file  Tobacco Use   Smoking status: Former    Packs/day: 1.00    Types: Cigarettes    Quit date: 04/12/2012    Years since quitting: 8.9   Smokeless tobacco: Never  Substance and Sexual Activity   Alcohol use: Yes    Alcohol/week: 1.0 standard drink    Types: 1 Standard drinks or equivalent per week   Drug use: Yes    Types: Marijuana    Comment: cannibus   Sexual  activity: Not on file  Other Topics Concern   Not on file  Social History Narrative   Married, father of 2, grandfather of 3.   He is a former smoker of about a pack to pack and half cigarettes a day -- he quit in March of this year.    He is an avid exerciser working at least 4-5 days a week doing her walking or stationary bike.   Social Determinants of Health   Financial Resource Strain: Not on file  Food Insecurity: Not on file  Transportation Needs: Not on file  Physical Activity: Not on file  Stress:  Not on file  Social Connections: Not on file     Family History: The patient's family history includes Coronary artery disease in his father; Hypertension in his mother and sister; Kidney failure in his mother and sister; Stroke in his maternal grandmother.  ROS:   Please see the history of present illness.    All other systems reviewed and are negative.  EKGs/Labs/Other Studies Reviewed:    The following studies were reviewed today:  Echo 10/09/2020: Sonographer Comments: Technically difficult study due to poor echo windows  and suboptimal apical window. Image acquisition challenging due to patient  body habitus.  IMPRESSIONS    1. Left ventricular ejection fraction, by estimation, is 60 to 65%. The  left ventricle has normal function. The left ventricle has no regional  wall motion abnormalities. Left ventricular diastolic parameters were  normal.   2. Right ventricular systolic function is normal. The right ventricular  size is normal. There is moderately elevated pulmonary artery systolic  pressure. The estimated right ventricular systolic pressure is 0000000 mmHg.   3. The mitral valve is normal in structure. Mild mitral valve  regurgitation. No evidence of mitral stenosis.   4. The aortic valve is tricuspid. There is moderate calcification of the  aortic valve. There is moderate thickening of the aortic valve. Aortic  valve regurgitation is not visualized. Mild to  moderate aortic valve  stenosis. Aortic valve area, by VTI  measures 1.39 cm. Aortic valve mean gradient measures 14.2 mmHg. Aortic  valve Vmax measures 2.54 m/s.   5. The inferior vena cava is normal in size with greater than 50%  respiratory variability, suggesting right atrial pressure of 3 mmHg.   Comparison(s): Report only. 12/10/10 EF >55%. PA pressure 38mmHg. Compared  to prior echo 2012, PA pressures have increased and AS is now present.   Bilateral Renal Artery Dopplers 10/02/2020: Summary:  Renal:     Right: No evidence of right renal artery stenosis. RRV flow present.         Cyst(s) noted. Normal size right kidney. Abnormal right         Resistive Index. Normal cortical thickness of right kidney.  Left:  No evidence of left renal artery stenosis. LRV flow present.         Cyst(s) noted. Normal size of left kidney. Abnormal left         Resisitve Index. Normal cortical thickness of the left         kidney.  Mesenteric:  Normal Celiac artery and Superior Mesenteric artery findings. Areas of  limited visceral study include left renal artery due to dense bowel gasocket in  this area.   EKG:  EKG is personally reviewed. 04/02/2020: EKG was not ordered. 09/25/2020: Sinus rhythm. Rate 62 bpm. RBBB.  Recent Labs: 10/02/2020: TSH 1.740 10/16/2020: Hemoglobin 13.4; Platelets 220 12/25/2020: ALT 18; BUN 21; Creatinine, Ser 1.93; Potassium 3.9; Sodium 140   Recent Lipid Panel    Component Value Date/Time   CHOL 140 12/25/2020 0742   TRIG 138 12/25/2020 0742   HDL 49 12/25/2020 0742   CHOLHDL 2.9 12/25/2020 0742   CHOLHDL 2.8 10/16/2020 0916   VLDL 19 04/08/2016 0828   LDLCALC 67 12/25/2020 0742   LDLCALC 74 10/16/2020 0916    Physical Exam:    Wt Readings from Last 3 Encounters:  04/02/21 207 lb 11.2 oz (94.2 kg)  03/17/21 210 lb 9.6 oz (95.5 kg)  11/25/20 214 lb 3.2 oz (97.2 kg)   VS:  BP  140/64 (BP Location: Right Arm, Patient Position: Sitting, Cuff Size: Normal)     Pulse 61    Ht 5\' 7"  (1.702 m)    Wt 207 lb 11.2 oz (94.2 kg)    BMI 32.53 kg/m  , BMI Body mass index is 32.53 kg/m. GENERAL:  Well appearing HEENT: Pupils equal round and reactive, fundi not visualized, oral mucosa unremarkable NECK:  No jugular venous distention, waveform within normal limits, carotid upstroke brisk and symmetric, no bruits, no thyromegaly LUNGS:  Clear to auscultation bilaterally HEART:  RRR.  PMI not displaced or sustained,S1 and S2 within normal limits, no S3, no S4, no clicks, no rubs, 3/6 systolic murmur at LUSB ABD:  Flat, positive bowel sounds normal in frequency in pitch, no bruits, no rebound, no guarding, no midline pulsatile mass, no hepatomegaly, no splenomegaly EXT:  2 plus pulses throughout, no edema, no cyanosis no clubbing SKIN:  No rashes no nodules NEURO:  Cranial nerves II through XII grossly intact, motor grossly intact throughout PSYCH:  Cognitively intact, oriented to person place and time   ASSESSMENT:    1. Essential hypertension   2. OSA on CPAP   3. CAD (coronary artery disease), with 60-70% stenosis in RCA   4. Obesity (BMI 30.0-34.9)   5. Nonrheumatic aortic valve stenosis     PLAN:    In order of problems listed above:  Essential hypertension Blood pressure has been uncontrolled lately.  He is exercising regularly.  He has struggled some with sodium intake and plans to work on this.  He uses his CPAP faithfully.  Thyroid function is normal.  We will plan to increase doxazosin to 4 mg twice daily.  His blood pressure tends to be high in the morning when he first checks it and then improves later in the day.  Hopefully adding an evening dose will help improve his early a.m. blood pressures.  Continue amlodipine, carvedilol, and valsartan.  He would like to enroll in our remote patient monitoring study.  We will have him return on an advanced hypertension clinic today so that we can help him do this.  OSA on CPAP Continue CPAP.  CAD  (coronary artery disease), with 60-70% stenosis in RCA Moderate non-obstructive CAD.  Medically managed and doing well.  Continue amlodipine, carvedilol, and rosuvastatin.  LDL is well controlled.  Controlled diabetes mellitus with circulatory complication, without long-term current use of insulin (HCC) Continue Farxiga.  Obesity (BMI 30.0-34.9) Continue working on diet and exercise.  Aortic stenosis Mild aortic stenosis.  Mean gradient 14 mmHg 10/2020.      Disposition: FU with Saagar Tortorella C. Duke Salviaandolph, MD, [facility] in [facility] clinic in 1 month.  Medication Adjustments/Labs and Tests Ordered: Current medicines are reviewed at length with the patient today.  Concerns regarding medicines are outlined above.   No orders of the defined types were placed in this encounter.  Meds ordered this encounter  Medications   doxazosin (CARDURA) 4 MG tablet    Sig: Take 1 tablet (4 mg total) by mouth 2 (two) times daily.    Dispense:  180 tablet    Refill:  3    NEW DOSE, D/C 4 MG DAILY RX   Patient Instructions  Medication Instructions:  INCREASE YOUR DOXAZOSIN TO 4 MG TWICE A DAY   Labwork: NONE  Testing/Procedures: NONE  Follow-Up: 04/29/2021 1:15 PM [facility]   If you need a refill on your cardiac medications before your next appointment, please call your pharmacy.  I,Mathew Stumpf,acting as a Education administrator for Skeet Latch, MD.,have documented all relevant documentation on the behalf of Skeet Latch, MD,as directed by  Skeet Latch, MD while in the presence of Skeet Latch, MD.  I, Glencoe Oval Linsey, MD have reviewed all documentation for this visit.  The documentation of the exam, diagnosis, procedures, and orders on 04/02/2021 are all accurate and complete.  Signed, Skeet Latch, MD  04/02/2021 8:27 AM    [facility]

## 2021-04-02 ENCOUNTER — Other Ambulatory Visit: Payer: Self-pay

## 2021-04-02 ENCOUNTER — Ambulatory Visit (INDEPENDENT_AMBULATORY_CARE_PROVIDER_SITE_OTHER): Payer: Medicare Other | Admitting: Cardiovascular Disease

## 2021-04-02 ENCOUNTER — Encounter (HOSPITAL_BASED_OUTPATIENT_CLINIC_OR_DEPARTMENT_OTHER): Payer: Self-pay | Admitting: Cardiovascular Disease

## 2021-04-02 DIAGNOSIS — Z9989 Dependence on other enabling machines and devices: Secondary | ICD-10-CM

## 2021-04-02 DIAGNOSIS — I2510 Atherosclerotic heart disease of native coronary artery without angina pectoris: Secondary | ICD-10-CM

## 2021-04-02 DIAGNOSIS — E669 Obesity, unspecified: Secondary | ICD-10-CM | POA: Diagnosis not present

## 2021-04-02 DIAGNOSIS — G4733 Obstructive sleep apnea (adult) (pediatric): Secondary | ICD-10-CM

## 2021-04-02 DIAGNOSIS — I10 Essential (primary) hypertension: Secondary | ICD-10-CM

## 2021-04-02 DIAGNOSIS — I350 Nonrheumatic aortic (valve) stenosis: Secondary | ICD-10-CM | POA: Diagnosis not present

## 2021-04-02 HISTORY — DX: Nonrheumatic aortic (valve) stenosis: I35.0

## 2021-04-02 MED ORDER — DOXAZOSIN MESYLATE 4 MG PO TABS: 4.0000 mg | ORAL_TABLET | Freq: Two times a day (BID) | ORAL | 3 refills | Status: DC

## 2021-04-02 NOTE — Assessment & Plan Note (Signed)
Continue working on diet and exercise.

## 2021-04-02 NOTE — Assessment & Plan Note (Signed)
-  Continue Farxiga 

## 2021-04-02 NOTE — Assessment & Plan Note (Signed)
Mild aortic stenosis.  Mean gradient 14 mmHg 10/2020.

## 2021-04-02 NOTE — Assessment & Plan Note (Signed)
Blood pressure has been uncontrolled lately.  He is exercising regularly.  He has struggled some with sodium intake and plans to work on this.  He uses his CPAP faithfully.  Thyroid function is normal.  We will plan to increase doxazosin to 4 mg twice daily.  His blood pressure tends to be high in the morning when he first checks it and then improves later in the day.  Hopefully adding an evening dose will help improve his early a.m. blood pressures.  Continue amlodipine, carvedilol, and valsartan.  He would like to enroll in our remote patient monitoring study.  We will have him return on an advanced hypertension clinic today so that we can help him do this.

## 2021-04-02 NOTE — Assessment & Plan Note (Signed)
Moderate non-obstructive CAD.  Medically managed and doing well.  Continue amlodipine, carvedilol, and rosuvastatin.  LDL is well controlled.

## 2021-04-02 NOTE — Assessment & Plan Note (Signed)
Continue CPAP.  

## 2021-04-02 NOTE — Patient Instructions (Signed)
Medication Instructions:  INCREASE YOUR DOXAZOSIN TO 4 MG TWICE A DAY   Labwork: NONE  Testing/Procedures: NONE  Follow-Up: 04/29/2021 1:15 PM WITH DR [facility]   If you need a refill on your cardiac medications before your next appointment, please call your pharmacy.

## 2021-04-20 DIAGNOSIS — Z20822 Contact with and (suspected) exposure to covid-19: Secondary | ICD-10-CM | POA: Diagnosis not present

## 2021-04-21 ENCOUNTER — Telehealth: Payer: Self-pay | Admitting: Pharmacist

## 2021-04-21 NOTE — Progress Notes (Signed)
? ? ?Chronic Care Management ?Pharmacy Assistant  ? ?Name: James Jones  MRN: [id number] DOB: January 15, 1946 ? ? ?Reason for Encounter: Disease State - Diabetes Call  ?  ? ?Recent office visits:  ?03/17/21 Carlus Pavlov, MD - Internal Medicine - Diabetes - labs were ordered. No medication changes. Follow up in 6 months.  ? ?Recent consult visits:  ?04/02/21 Chilton Si, MD - Cardiology - Hypertension - doxazosin (CARDURA) 4 MG tablet prescribed. Follow up in 1 month.  ? ?Hospital visits:  ?None in previous 6 months ? ?Medications: ?Outpatient Encounter Medications as of 04/21/2021  ?Medication Sig  ? allopurinol (ZYLOPRIM) 100 MG tablet Take 1 tablet (100 mg total) by mouth daily.  ? amLODipine (NORVASC) 10 MG tablet Take 1 tablet (10 mg total) by mouth daily.  ? Blood Glucose Monitoring Suppl (FREESTYLE LITE) DEVI Use to check blood sugar once daily  ? carvedilol (COREG) 25 MG tablet Take 1 tablet (25 mg total) by mouth 2 (two) times daily.  ? Cholecalciferol (VITAMIN D-3) 1000 UNITS CAPS Take by mouth daily. 2 capsules.  ? dapagliflozin propanediol (FARXIGA) 10 MG TABS tablet Take 1 tablet (10 mg total) by mouth daily before breakfast.  ? doxazosin (CARDURA) 4 MG tablet Take 1 tablet (4 mg total) by mouth 2 (two) times daily.  ? ferrous sulfate 325 (65 FE) MG tablet Take 1 tablet (325 mg total) by mouth 2 (two) times daily with a meal.  ? fish oil-omega-3 fatty acids 1000 MG capsule Take 1,200 mg by mouth daily.   ? glipiZIDE (GLUCOTROL XL) 2.5 MG 24 hr tablet Take 1 tablet (2.5 mg total) by mouth daily with breakfast.  ? glucose blood (FREESTYLE LITE) test strip USE TO CHECK BLOOD SUGAR 1-2x DAILY. E11.59  ? Multiple Vitamin (MULTIVITAMIN WITH MINERALS) TABS Take 1 tablet by mouth daily.  ? pantoprazole (PROTONIX) 40 MG tablet TAKE 1 TABLET BY MOUTH EVERY DAY  ? Potassium 99 MG TABS Take 99 mg by mouth daily.   ? PRESCRIPTION MEDICATION Uses C-PAP at bedtime  ? rosuvastatin (CRESTOR) 40 MG tablet Take 1 tablet  (40 mg total) by mouth daily.  ? valsartan (DIOVAN) 320 MG tablet TAKE 1 TABLET BY MOUTH EVERY DAY  ? ?No facility-administered encounter medications on file as of 04/21/2021.  ? ? ?Current antihyperglycemic regimen:  ?Glipizide XL 2.5mg  daily ?Farxiga 10mg  daily ? ? ?What recent interventions/DTPs have been made to improve glycemic control:  ?Patient denied any recent medication changes.  ? ?Have there been any recent hospitalizations or ED visits since last visit with CPP?  ?Patient has not had any hospitalizations or ED visits since last visit with CPP.  ? ?Patient denies hypoglycemic symptoms, including Pale, Sweaty, Shaky, Hungry, Nervous/irritable, and Vision changes ? ? ?Patient denies hyperglycemic symptoms, including blurry vision, excessive thirst, fatigue, polyuria, and weakness ? ? ?How often are you checking your blood sugar? ?Patient reported checking blood sugars every morning.  ?  ?What are your blood sugars ranging?  ?Fasting: 100-107 ?Before meals:  ?After meals:  ?Bedtime:  ? ?During the week, how often does your blood glucose drop below 70? ? Patient denied any readings below 70 in a long time reported.  ? ?Are you checking your feet daily/regularly? ?Patient does check his feet regularly and currently denies and concerns at the moment.  ? ? ?Adherence Review: ?Is the patient currently on a STATIN medication? Yes ?Is the patient currently on ACE/ARB medication? Yes ?Does the patient have >5 day gap between last  estimated fill dates? No ? ? ?Care Gaps ?  ?AWV: done 04/15/20 ?Colonoscopy: unknown  ?DM Eye Exam: due 04/28/21 ?DM Foot Exam:  due 04/15/21 ?Microalbumin: done 04/11/20 ?HbgAIC: done 04/11/20 (7.0) ?DEXA: N/A ?Mammogram: N/A ? ?  ?Star Rating Drugs: ?Valsartan 320 mg - last filled 01/27/21 90 days  ?Dapagliflozin 10 mg - last filled 04/08/21 90 days  ?Rosuvastatin 20 mg - last filled 02/04/21 90 days ? ? ?Future Appointments  ?Date Time Provider Department Center  ?04/29/2021  1:15 PM Chilton Si, MD [facility]  ?09/08/2021  9:00 AM Carlus Pavlov, MD [facility] None  ? ? ?Liza Showfety, CCMA ?Clinical Pharmacist Assistant  ?[phone] ? ? ?

## 2021-04-23 ENCOUNTER — Ambulatory Visit (INDEPENDENT_AMBULATORY_CARE_PROVIDER_SITE_OTHER): Payer: Medicare Other

## 2021-04-23 ENCOUNTER — Other Ambulatory Visit: Payer: Self-pay

## 2021-04-23 VITALS — Ht 67.0 in | Wt 207.0 lb

## 2021-04-23 DIAGNOSIS — Z596 Low income: Secondary | ICD-10-CM | POA: Diagnosis not present

## 2021-04-23 DIAGNOSIS — Z0001 Encounter for general adult medical examination with abnormal findings: Secondary | ICD-10-CM

## 2021-04-23 DIAGNOSIS — Z87891 Personal history of nicotine dependence: Secondary | ICD-10-CM

## 2021-04-23 NOTE — Progress Notes (Signed)
? ?Subjective:  ? James Jones is a 76 y.o. male who presents for Medicare Annual/Subsequent preventive examination. ?Virtual Visit via Telephone Note ? ?I connected with  [name] on 04/23/21 at  9:00 AM EDT by telephone and verified that I am speaking with the correct person using two identifiers. ? ?Location: ?Patient: HOME ?Provider: [facility] ?Persons participating in the virtual visit: patient/Nurse Health Advisor ?  ?I discussed the limitations, risks, security and privacy concerns of performing an evaluation and management service by telephone and the availability of in person appointments. The patient expressed understanding and agreed to proceed. ? ?Interactive audio and video telecommunications were attempted between this nurse and patient, however failed, due to patient having technical difficulties OR patient did not have access to video capability.  We continued and completed visit with audio only. ? ?Some vital signs may be absent or patient reported.  ? ?Chriss Driver, LPN ? ?Review of Systems    ? ?Cardiac Risk Factors include: advanced age (>28men, >53 women);diabetes mellitus;hypertension;dyslipidemia;male gender;sedentary lifestyle;obesity (BMI >30kg/m2);smoking/ tobacco exposure ? ?   ?Objective:  ?  ?Today's Vitals  ? 04/23/21 0913  ?Weight: 207 lb (93.9 kg)  ?Height: 5\' 7"  (1.702 m)  ? ?Body mass index is 32.42 kg/m?. ? ?Advanced Directives 04/23/2021 10/05/2017 11/27/2015 03/17/2012  ?Does Patient Have a Medical Advance Directive? No No No Patient does not have advance directive  ?Would patient like information on creating a medical advance directive? No - Patient declined - - -  ?Pre-existing out of facility DNR order (yellow form or pink MOST form) - - - No  ? ? ?Current Medications (verified) ?Outpatient Encounter Medications as of 04/23/2021  ?Medication Sig  ? allopurinol (ZYLOPRIM) 100 MG tablet Take 1 tablet (100 mg total) by mouth daily.  ? amLODipine (NORVASC) 10 MG tablet Take 1  tablet (10 mg total) by mouth daily.  ? Blood Glucose Monitoring Suppl (FREESTYLE LITE) DEVI Use to check blood sugar once daily  ? Cholecalciferol (VITAMIN D-3) 1000 UNITS CAPS Take by mouth daily. 2 capsules.  ? dapagliflozin propanediol (FARXIGA) 10 MG TABS tablet Take 1 tablet (10 mg total) by mouth daily before breakfast.  ? doxazosin (CARDURA) 4 MG tablet Take 1 tablet (4 mg total) by mouth 2 (two) times daily.  ? ferrous sulfate 325 (65 FE) MG tablet Take 1 tablet (325 mg total) by mouth 2 (two) times daily with a meal.  ? fish oil-omega-3 fatty acids 1000 MG capsule Take 1,200 mg by mouth daily.   ? glipiZIDE (GLUCOTROL XL) 2.5 MG 24 hr tablet Take 1 tablet (2.5 mg total) by mouth daily with breakfast.  ? glucose blood (FREESTYLE LITE) test strip USE TO CHECK BLOOD SUGAR 1-2x DAILY. E11.59  ? Multiple Vitamin (MULTIVITAMIN WITH MINERALS) TABS Take 1 tablet by mouth daily.  ? pantoprazole (PROTONIX) 40 MG tablet TAKE 1 TABLET BY MOUTH EVERY DAY  ? Potassium 99 MG TABS Take 99 mg by mouth daily.   ? PRESCRIPTION MEDICATION Uses C-PAP at bedtime  ? rosuvastatin (CRESTOR) 40 MG tablet Take 1 tablet (40 mg total) by mouth daily.  ? valsartan (DIOVAN) 320 MG tablet TAKE 1 TABLET BY MOUTH EVERY DAY  ? carvedilol (COREG) 25 MG tablet Take 1 tablet (25 mg total) by mouth 2 (two) times daily.  ? ?No facility-administered encounter medications on file as of 04/23/2021.  ? ? ?Allergies (verified) ?Atorvastatin  ? ?History: ?Past Medical History:  ?Diagnosis Date  ? Aortic stenosis 04/02/2021  ?  CKD (chronic kidney disease) stage 2, GFR 60-89 ml/min 03/17/2012  ? Coronary artery disease, non-occlusive 03/18/2012  ? 60-70% stenosis in RCA -- FFR 0.84; EF 60-65%  ? Diabetes mellitus without complication ([place])   ? GERD (gastroesophageal reflux disease)   ? On PPI  ? History of GI bleed January 2015  ? No obvious findings on EGD/colonoscopy  ? Hydrocele, bilateral   ? large on [place] 2020  ? Hyperlipidemia   ? Hypertension   ?  Obesity (BMI 30.0-34.9) 10/02/2012  ? OSA (obstructive sleep apnea) 03/18/2012  ? Doing better with CPAP  ? Pulmonary hypertension ([place]) 12/10/2010  ? ECHO:  Mild PH,mild LVH; PA pressures estimated 30-40 mmHg  ? 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  ? Event monitor; usually during sleeping hours; therefore not on beta blocker  ? ?Past Surgical History:  ?Procedure Laterality Date  ? APPENDECTOMY  1974  ? CARPAL TUNNEL RELEASE    ? EYE SURGERY  1960  ? Left eye  ? HEMORRHOID SURGERY    ? LEFT HEART CATH AND CORONARY ANGIOGRAPHY  03/27/2010  ? Moderate mid RCA lesion,right radial approach,normal EF  ? LEFT HEART CATHETERIZATION WITH CORONARY ANGIOGRAM N/A 03/17/2012  ? Procedure: LEFT HEART CATHETERIZATION WITH CORONARY ANGIOGRAM;  Surgeon: Leonie [place], MD;  Location: [facility] C[facility] LAB;  Service: Cardiovascular: Fractional Flow Reserve Measurement of mid RCA 60-70% stenosis = 0.84. Otherwise mild LCA CAD.  Normal EF & EDP  ? SHOULDER ARTHROSCOPY  2006  ? TRANSTHORACIC ECHOCARDIOGRAM  12/2010  ?  Mild concentric LVH.  EF> 55%.  GR 1 DD.  Mild aortic sclerosis no stenosis.  PA pressures estimated 30-40 mmHg  ? ?Family History  ?Problem Relation Age of Onset  ? Hypertension Mother   ? Kidney failure Mother   ? Coronary artery disease Father   ? Hypertension Sister   ? Kidney failure Sister   ? Stroke Maternal Grandmother   ? ?Social History  ? ?Socioeconomic History  ? Marital status: Married  ?  Spouse name: Not on file  ? Number of children: Not on file  ? Years of education: Not on file  ? Highest education level: Not on file  ?Occupational History  ? Not on file  ?Tobacco Use  ? Smoking status: Former  ?  Packs/day: 1.00  ?  Types: Cigarettes  ?  Quit date: 04/12/2012  ?  Years since quitting: 9.0  ? Smokeless tobacco: Never  ?Substance and Sexual Activity  ? Alcohol use: Yes  ?  Alcohol/week: 1.0 standard drink  ?  Types: 1 Standard drinks or equivalent per week  ? Drug use: Yes  ?  Types: Marijuana  ?   Comment: cannibus  ? Sexual activity: Not on file  ?Other Topics Concern  ? Not on file  ?Social History Narrative  ? Married, father of 2, grandfather of 3.  ? He is a former smoker of about a pack to pack and half cigarettes a day -- he quit in March of this year.   ? He is an avid exerciser working at least 4-5 days a week doing her walking or stationary bike.  ? ?Social Determinants of Health  ? ?Financial Resource Strain: Medium Risk  ? Difficulty of Paying Living Expenses: Somewhat hard  ?Food Insecurity: No Food Insecurity  ? Worried About Charity fundraiser in the Last Year: Never true  ? Ran Out of Food in the Last Year: Never true  ?Transportation Needs: No Transportation  Needs  ? Lack of Transportation (Medical): No  ? Lack of Transportation (Non-Medical): No  ?Physical Activity: Sufficiently Active  ? Days of Exercise per Week: 3 days  ? Minutes of Exercise per Session: 60 min  ?Stress: No Stress Concern Present  ? Feeling of Stress : Only a little  ?Social Connections: Socially Integrated  ? Frequency of Communication with Friends and Family: More than three times a week  ? Frequency of Social Gatherings with Friends and Family: More than three times a week  ? Attends Religious Services: More than 4 times per year  ? Active Member of Clubs or Organizations: Yes  ? Attends Archivist Meetings: More than 4 times per year  ? Marital Status: Married  ? ? ?Tobacco Counseling ?Counseling given: Not Answered ? ? ?Clinical Intake: ? ?Pre-visit preparation completed: Yes ? ?Pain : No/denies pain ? ?  ? ?BMI - recorded: 32.42 ?Nutritional Status: BMI > 30  Obese ?Nutritional Risks: None ?Diabetes: Yes ? ?How often do you need to have someone help you when you read instructions, pamphlets, or other written materials from your doctor or pharmacy?: 1 - Never ? ?Diabetic?Nutrition Risk Assessment: ? ?Has the patient had any N/V/D within the last 2 months?  No  ?Does the patient have any non-healing wounds?   No  ?Has the patient had any unintentional weight loss or weight gain?  No  ? ?Diabetes: ? ?Is the patient diabetic?  Yes  ?If diabetic, was a CBG obtained today?  No  ?Did the patient bring in their glucometer

## 2021-04-23 NOTE — Patient Instructions (Signed)
Mr. Rosengren , ?Thank you for taking time to come for your Medicare Wellness Visit. I appreciate your ongoing commitment to your health goals. Please review the following plan we discussed and let me know if I can assist you in the future.  ? ?Screening recommendations/referrals: ?Colonoscopy: Done 02/08/2013. Repeat not required. ? ?Recommended yearly ophthalmology/optometry visit for glaucoma screening and checkup ?Recommended yearly dental visit for hygiene and checkup ? ?Vaccinations: ?Influenza vaccine: Done 01/27/21 Repeat annually ? ?Pneumococcal vaccine: Discussed.  ?Tdap vaccine: Done 02/09/2008 Repeat in 10 years ? ?Shingles vaccine: Discussed.    ?Covid-19: Done 02/23/19, 03/16/19, 11/03/19, 05/13/20.  ? ?Advanced directives: Advance directive discussed with you today. Even though you declined this today, please call our office should you change your mind, and we can give you the proper paperwork for you to fill out. ? ? ?Conditions/risks identified: KEEP UP THE GOOD WORK!! ? ?Next appointment: Follow up in one year for your annual wellness visit. 2024. ? ?Preventive Care 5 Years and Older, Male ? ?Preventive care refers to lifestyle choices and visits with your health care provider that can promote health and wellness. ?What does preventive care include? ?A yearly physical exam. This is also called an annual well check. ?Dental exams once or twice a year. ?Routine eye exams. Ask your health care provider how often you should have your eyes checked. ?Personal lifestyle choices, including: ?Daily care of your teeth and gums. ?Regular physical activity. ?Eating a healthy diet. ?Avoiding tobacco and drug use. ?Limiting alcohol use. ?Practicing safe sex. ?Taking low doses of aspirin every day. ?Taking vitamin and mineral supplements as recommended by your health care provider. ?What happens during an annual well check? ?The services and screenings done by your health care provider during your annual well check will  depend on your age, overall health, lifestyle risk factors, and family history of disease. ?Counseling  ?Your health care provider may ask you questions about your: ?Alcohol use. ?Tobacco use. ?Drug use. ?Emotional well-being. ?Home and relationship well-being. ?Sexual activity. ?Eating habits. ?History of falls. ?Memory and ability to understand (cognition). ?Work and work Astronomer. ?Screening  ?You may have the following tests or measurements: ?Height, weight, and BMI. ?Blood pressure. ?Lipid and cholesterol levels. These may be checked every 5 years, or more frequently if you are over 41 years old. ?Skin check. ?Lung cancer screening. You may have this screening every year starting at age 83 if you have a 30-pack-year history of smoking and currently smoke or have quit within the past 15 years. ?Fecal occult blood test (FOBT) of the stool. You may have this test every year starting at age 70. ?Flexible sigmoidoscopy or colonoscopy. You may have a sigmoidoscopy every 5 years or a colonoscopy every 10 years starting at age 15. ?Prostate cancer screening. Recommendations will vary depending on your family history and other risks. ?Hepatitis C blood test. ?Hepatitis B blood test. ?Sexually transmitted disease (STD) testing. ?Diabetes screening. This is done by checking your blood sugar (glucose) after you have not eaten for a while (fasting). You may have this done every 1-3 years. ?Abdominal aortic aneurysm (AAA) screening. You may need this if you are a current or former smoker. ?Osteoporosis. You may be screened starting at age 52 if you are at high risk. ?Talk with your health care provider about your test results, treatment options, and if necessary, the need for more tests. ?Vaccines  ?Your health care provider may recommend certain vaccines, such as: ?Influenza vaccine. This is recommended every year. ?  Tetanus, diphtheria, and acellular pertussis (Tdap, Td) vaccine. You may need a Td booster every 10  years. ?Zoster vaccine. You may need this after age 31. ?Pneumococcal 13-valent conjugate (PCV13) vaccine. One dose is recommended after age 43. ?Pneumococcal polysaccharide (PPSV23) vaccine. One dose is recommended after age 70. ?Talk to your health care provider about which screenings and vaccines you need and how often you need them. ?This information is not intended to replace advice given to you by your health care provider. Make sure you discuss any questions you have with your health care provider. ?Document Released: 02/21/2015 Document Revised: 10/15/2015 Document Reviewed: 11/26/2014 ?Elsevier Interactive Patient Education ? 2017 [place]. ? ?Fall Prevention in the Home ?Falls can cause injuries. They can happen to people of all ages. There are many things you can do to make your home safe and to help prevent falls. ?What can I do on the outside of my home? ?Regularly fix the edges of walkways and driveways and fix any cracks. ?Remove anything that might make you trip as you walk through a door, such as a raised step or threshold. ?Trim any bushes or trees on the path to your home. ?Use bright outdoor lighting. ?Clear any walking paths of anything that might make someone trip, such as rocks or tools. ?Regularly check to see if handrails are loose or broken. Make sure that both sides of any steps have handrails. ?Any raised decks and porches should have guardrails on the edges. ?Have any leaves, snow, or ice cleared regularly. ?Use sand or salt on walking paths during winter. ?Clean up any spills in your garage right away. This includes oil or grease spills. ?What can I do in the bathroom? ?Use night lights. ?Install grab bars by the toilet and in the tub and shower. Do not use towel bars as grab bars. ?Use non-skid mats or decals in the tub or shower. ?If you need to sit down in the shower, use a plastic, non-slip stool. ?Keep the floor dry. Clean up any water that spills on the floor as soon as it  happens. ?Remove soap buildup in the tub or shower regularly. ?Attach bath mats securely with double-sided non-slip rug tape. ?Do not have throw rugs and other things on the floor that can make you trip. ?What can I do in the bedroom? ?Use night lights. ?Make sure that you have a light by your bed that is easy to reach. ?Do not use any sheets or blankets that are too big for your bed. They should not hang down onto the floor. ?Have a firm chair that has side arms. You can use this for support while you get dressed. ?Do not have throw rugs and other things on the floor that can make you trip. ?What can I do in the kitchen? ?Clean up any spills right away. ?Avoid walking on wet floors. ?Keep items that you use a lot in easy-to-reach places. ?If you need to reach something above you, use a strong step stool that has a grab bar. ?Keep electrical cords out of the way. ?Do not use floor polish or wax that makes floors slippery. If you must use wax, use non-skid floor wax. ?Do not have throw rugs and other things on the floor that can make you trip. ?What can I do with my stairs? ?Do not leave any items on the stairs. ?Make sure that there are handrails on both sides of the stairs and use them. Fix handrails that are broken or loose.  Make sure that handrails are as long as the stairways. ?Check any carpeting to make sure that it is firmly attached to the stairs. Fix any carpet that is loose or worn. ?Avoid having throw rugs at the top or bottom of the stairs. If you do have throw rugs, attach them to the floor with carpet tape. ?Make sure that you have a light switch at the top of the stairs and the bottom of the stairs. If you do not have them, ask someone to add them for you. ?What else can I do to help prevent falls? ?Wear shoes that: ?Do not have high heels. ?Have rubber bottoms. ?Are comfortable and fit you well. ?Are closed at the toe. Do not wear sandals. ?If you use a stepladder: ?Make sure that it is fully opened.  Do not climb a closed stepladder. ?Make sure that both sides of the stepladder are locked into place. ?Ask someone to hold it for you, if possible. ?Clearly mark and make sure that you can see: ?Any grab bars or

## 2021-04-26 ENCOUNTER — Other Ambulatory Visit: Payer: Self-pay | Admitting: Family Medicine

## 2021-04-29 ENCOUNTER — Other Ambulatory Visit: Payer: Self-pay

## 2021-04-29 ENCOUNTER — Encounter (HOSPITAL_BASED_OUTPATIENT_CLINIC_OR_DEPARTMENT_OTHER): Payer: Self-pay | Admitting: Cardiovascular Disease

## 2021-04-29 ENCOUNTER — Ambulatory Visit (INDEPENDENT_AMBULATORY_CARE_PROVIDER_SITE_OTHER): Payer: Medicare Other | Admitting: Cardiovascular Disease

## 2021-04-29 DIAGNOSIS — G4733 Obstructive sleep apnea (adult) (pediatric): Secondary | ICD-10-CM | POA: Diagnosis not present

## 2021-04-29 DIAGNOSIS — E785 Hyperlipidemia, unspecified: Secondary | ICD-10-CM

## 2021-04-29 DIAGNOSIS — I2510 Atherosclerotic heart disease of native coronary artery without angina pectoris: Secondary | ICD-10-CM | POA: Diagnosis not present

## 2021-04-29 DIAGNOSIS — E119 Type 2 diabetes mellitus without complications: Secondary | ICD-10-CM | POA: Diagnosis not present

## 2021-04-29 DIAGNOSIS — Z9989 Dependence on other enabling machines and devices: Secondary | ICD-10-CM

## 2021-04-29 DIAGNOSIS — I10 Essential (primary) hypertension: Secondary | ICD-10-CM | POA: Diagnosis not present

## 2021-04-29 DIAGNOSIS — H35033 Hypertensive retinopathy, bilateral: Secondary | ICD-10-CM | POA: Diagnosis not present

## 2021-04-29 DIAGNOSIS — H04223 Epiphora due to insufficient drainage, bilateral lacrimal glands: Secondary | ICD-10-CM | POA: Diagnosis not present

## 2021-04-29 DIAGNOSIS — I350 Nonrheumatic aortic (valve) stenosis: Secondary | ICD-10-CM

## 2021-04-29 DIAGNOSIS — H524 Presbyopia: Secondary | ICD-10-CM | POA: Diagnosis not present

## 2021-04-29 LAB — HM DIABETES EYE EXAM

## 2021-04-29 NOTE — Assessment & Plan Note (Signed)
Moderate, non-obstructive CAD.  He is exercising regularly and has no symptoms.  Lipids are well controlled.  Continue carvedilol, amlodipine, and rosuvastatin. ?

## 2021-04-29 NOTE — Patient Instructions (Signed)
Medication Instructions:  ?Your physician recommends that you continue on your current medications as directed. Please refer to the Current Medication list given to you today.  ? ?*If you need a refill on your cardiac medications before your next appointment, please call your pharmacy* ? ?Lab Work: ?NONE ? ?Testing/Procedures: ?Your physician has requested that you have an echocardiogram. Echocardiography is a painless test that uses sound waves to create images of your heart. It provides your doctor with information about the size and shape of your heart and how well your heart?s chambers and valves are working. This procedure takes approximately one hour. There are no restrictions for this procedure.  ?IN September BEFORE FOLLOW UP  ? ?Follow-Up: ?At [facility], you and your health needs are our priority.  As part of our continuing mission to provide you with exceptional heart care, we have created designated Provider Care Teams.  These Care Teams include your primary Cardiologist (physician) and Advanced Practice Providers (APPs -  Physician Assistants and Nurse Practitioners) who all work together to provide you with the care you need, when you need it. ? ?We recommend signing up for the patient portal called "MyChart".  Sign up information is provided on this After Visit Summary.  MyChart is used to connect with patients for Virtual Visits (Telemedicine).  Patients are able to view lab/test results, encounter notes, upcoming appointments, etc.  Non-urgent messages can be sent to your provider as well.   ?To learn more about what you can do with MyChart, go to [email or web address].   ? ?Your next appointment:   ?IN 6 month(s) AFTER YOU HAVE ECHO  ? ?The format for your next appointment:   ?In Person ? ?Provider:   ?Chilton Si, MD  ? ? ?

## 2021-04-29 NOTE — Assessment & Plan Note (Signed)
He is doing well with his CPAP.  Continue using it regularly. ?

## 2021-04-29 NOTE — Assessment & Plan Note (Signed)
Blood pressure is now much better controlled.  Continue amlodipine, carvedilol, doxazosin, and valsartan. ?

## 2021-04-29 NOTE — Assessment & Plan Note (Signed)
Mild aortic stenosis on echo 10/2020.  Plan to repeat echo 10/2021 and follow-up after that. ?

## 2021-04-29 NOTE — Progress Notes (Signed)
?Cardiology Office Note:   ? ?Date:  04/29/2021  ? ?ID:  James Jones, DOB Sep 12, 1945, MRN [id number] ? ?PCP:  Susy Frizzle, MD ?  ?[facility] Providers ?Cardiologist:  Skeet Latch, MD    ? ?Referring MD: Susy Frizzle, MD  ? ?No chief complaint on file. ? ?History of Present Illness:   ? ?James Jones is a 76 y.o. male with a hx of mild aortic stenosis, CKD stage 2, non-occlusive CAD, diabetes mellitus, GERD, hyperlipidemia, hypertension, OSA on CPAP, who is here for follow-up. He was previously a patient of Dr. Ellyn Hack, who last saw him 05/2019 for follow-up. At that time he was doing well but complained of a lack of energy. He had a remote cardiac cath 03/2012 that revealed 60-70% RCA stenosis. He had myalgias on atorvastatin but has tolerated rosuvastatin. ? ?He was noted to have a systolic murmur and was referred for an echo 10/2020 that revealed LVEF 60 to 65% with normal diastolic function.  PASP was moderately elevated at 47 mmHg.  He had mild aortic stenosis.  Mean gradient was 14.2 mmHg.  He followed up with Laurann Montana, NP 11/25/20 and his blood pressure was well controlled on valsartan, coreg, amlodipine, and doxazosin. His home blood pressures were uncontrolled, so doxazosin was increased. He wanted to enroll in our remote patient monitoring study and presents today for follow-up. Today, he is feeling good aside from constantly feeling cold. He notes that this morning there was pallor of the skin in his hands. His current antihypertensive regimen is working well. At home he typically sees readings such as [id number] systolic. For exercise he is walking often. This morning he completed 6 miles. Every night he faithfully uses his CPAP. He denies any palpitations, chest pain, shortness of breath, or peripheral edema. No lightheadedness, headaches, syncope, orthopnea, or PND. No bleeding issues. ? ? ?Past Medical History:  ?Diagnosis Date  ? Aortic stenosis 04/02/2021  ? CKD (chronic kidney  disease) stage 2, GFR 60-89 ml/min 03/17/2012  ? Coronary artery disease, non-occlusive 03/18/2012  ? 60-70% stenosis in RCA -- FFR 0.84; EF 60-65%  ? Diabetes mellitus without complication ([place])   ? GERD (gastroesophageal reflux disease)   ? On PPI  ? History of GI bleed January 2015  ? No obvious findings on EGD/colonoscopy  ? Hydrocele, bilateral   ? large on [place] 2020  ? Hyperlipidemia   ? Hypertension   ? Obesity (BMI 30.0-34.9) 10/02/2012  ? OSA (obstructive sleep apnea) 03/18/2012  ? Doing better with CPAP  ? Pulmonary hypertension ([place]) 12/10/2010  ? ECHO:  Mild PH,mild LVH; PA pressures estimated 30-40 mmHg  ? 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  ? Event monitor; usually during sleeping hours; therefore not on beta blocker  ? ? ?Past Surgical History:  ?Procedure Laterality Date  ? APPENDECTOMY  1974  ? CARPAL TUNNEL RELEASE    ? EYE SURGERY  1960  ? Left eye  ? HEMORRHOID SURGERY    ? LEFT HEART CATH AND CORONARY ANGIOGRAPHY  03/27/2010  ? Moderate mid RCA lesion,right radial approach,normal EF  ? LEFT HEART CATHETERIZATION WITH CORONARY ANGIOGRAM N/A 03/17/2012  ? Procedure: LEFT HEART CATHETERIZATION WITH CORONARY ANGIOGRAM;  Surgeon: Leonie Man, MD;  Location: [facility] CATH LAB;  Service: Cardiovascular: Fractional Flow Reserve Measurement of mid RCA 60-70% stenosis = 0.84. Otherwise mild LCA CAD.  Normal EF & EDP  ? SHOULDER ARTHROSCOPY  2006  ? TRANSTHORACIC ECHOCARDIOGRAM  12/2010  ?  Mild  concentric LVH.  EF> 55%.  GR 1 DD.  Mild aortic sclerosis no stenosis.  PA pressures estimated 30-40 mmHg  ? ? ?Current Medications: ?Current Meds  ?Medication Sig  ? allopurinol (ZYLOPRIM) 100 MG tablet Take 1 tablet (100 mg total) by mouth daily.  ? amLODipine (NORVASC) 10 MG tablet Take 1 tablet (10 mg total) by mouth daily.  ? Blood Glucose Monitoring Suppl (FREESTYLE LITE) DEVI Use to check blood sugar once daily  ? carvedilol (COREG) 25 MG tablet Take 1 tablet (25 mg total) by mouth 2 (two) times  daily.  ? Cholecalciferol (VITAMIN D-3) 1000 UNITS CAPS Take by mouth daily. 2 capsules.  ? dapagliflozin propanediol (FARXIGA) 10 MG TABS tablet Take 1 tablet (10 mg total) by mouth daily before breakfast.  ? doxazosin (CARDURA) 4 MG tablet Take 1 tablet (4 mg total) by mouth 2 (two) times daily.  ? ferrous sulfate 325 (65 FE) MG tablet Take 1 tablet (325 mg total) by mouth 2 (two) times daily with a meal.  ? fish oil-omega-3 fatty acids 1000 MG capsule Take 1,200 mg by mouth daily.   ? glipiZIDE (GLUCOTROL XL) 2.5 MG 24 hr tablet Take 1 tablet (2.5 mg total) by mouth daily with breakfast.  ? glucose blood (FREESTYLE LITE) test strip USE TO CHECK BLOOD SUGAR 1-2x DAILY. E11.59  ? Multiple Vitamin (MULTIVITAMIN WITH MINERALS) TABS Take 1 tablet by mouth daily.  ? pantoprazole (PROTONIX) 40 MG tablet TAKE 1 TABLET BY MOUTH EVERY DAY  ? PRESCRIPTION MEDICATION Uses C-PAP at bedtime  ? rosuvastatin (CRESTOR) 40 MG tablet Take 1 tablet (40 mg total) by mouth daily.  ? valsartan (DIOVAN) 320 MG tablet TAKE 1 TABLET BY MOUTH EVERY DAY  ?  ? ?Allergies:   Atorvastatin  ? ?Social History  ? ?Socioeconomic History  ? Marital status: Married  ?  Spouse name: Not on file  ? Number of children: Not on file  ? Years of education: Not on file  ? Highest education level: Not on file  ?Occupational History  ? Not on file  ?Tobacco Use  ? Smoking status: Former  ?  Packs/day: 1.00  ?  Types: Cigarettes  ?  Quit date: 04/12/2012  ?  Years since quitting: 9.0  ? Smokeless tobacco: Never  ?Substance and Sexual Activity  ? Alcohol use: Yes  ?  Alcohol/week: 1.0 standard drink  ?  Types: 1 Standard drinks or equivalent per week  ? Drug use: Yes  ?  Types: Marijuana  ?  Comment: cannibus  ? Sexual activity: Not on file  ?Other Topics Concern  ? Not on file  ?Social History Narrative  ? Married, father of 2, grandfather of 3.  ? He is a former smoker of about a pack to pack and half cigarettes a day -- he quit in March of this year.   ? He  is an avid exerciser working at least 4-5 days a week doing her walking or stationary bike.  ? ?Social Determinants of Health  ? ?Financial Resource Strain: Medium Risk  ? Difficulty of Paying Living Expenses: Somewhat hard  ?Food Insecurity: No Food Insecurity  ? Worried About Charity fundraiser in the Last Year: Never true  ? Ran Out of Food in the Last Year: Never true  ?Transportation Needs: No Transportation Needs  ? Lack of Transportation (Medical): No  ? Lack of Transportation (Non-Medical): No  ?Physical Activity: Sufficiently Active  ? Days of Exercise per Week: 3 days  ?  Minutes of Exercise per Session: 60 min  ?Stress: No Stress Concern Present  ? Feeling of Stress : Only a little  ?Social Connections: Socially Integrated  ? Frequency of Communication with Friends and Family: More than three times a week  ? Frequency of Social Gatherings with Friends and Family: More than three times a week  ? Attends Religious Services: More than 4 times per year  ? Active Member of Clubs or Organizations: Yes  ? Attends Archivist Meetings: More than 4 times per year  ? Marital Status: Married  ?  ? ?Family History: ?The patient's family history includes Coronary artery disease in his father; Hypertension in his mother and sister; Kidney failure in his mother and sister; Stroke in his maternal grandmother. ? ?ROS:   ?Please see the history of present illness.    ?(+) Chills ?All other systems reviewed and are negative. ? ?EKGs/Labs/Other Studies Reviewed:   ? ?The following studies were reviewed today: ? ?Echo 10/09/2020: ?Sonographer Comments: Technically difficult study due to poor echo windows and suboptimal apical window. Image acquisition challenging due to patient body habitus.  ?IMPRESSIONS  ? ? 1. Left ventricular ejection fraction, by estimation, is 60 to 65%. The  ?left ventricle has normal function. The left ventricle has no regional  ?wall motion abnormalities. Left ventricular diastolic parameters  were  ?normal.  ? 2. Right ventricular systolic function is normal. The right ventricular  ?size is normal. There is moderately elevated pulmonary artery systolic  ?pressure. The estimated right ventricular s

## 2021-04-29 NOTE — Assessment & Plan Note (Signed)
Lipids are controlled on rosuvastatin.  Continue current dose. ?

## 2021-04-30 ENCOUNTER — Other Ambulatory Visit: Payer: Medicare Other

## 2021-04-30 DIAGNOSIS — E1159 Type 2 diabetes mellitus with other circulatory complications: Secondary | ICD-10-CM

## 2021-04-30 DIAGNOSIS — E1122 Type 2 diabetes mellitus with diabetic chronic kidney disease: Secondary | ICD-10-CM

## 2021-04-30 DIAGNOSIS — N183 Chronic kidney disease, stage 3 unspecified: Secondary | ICD-10-CM | POA: Diagnosis not present

## 2021-05-01 ENCOUNTER — Encounter: Payer: Self-pay | Admitting: Internal Medicine

## 2021-05-01 LAB — COMPREHENSIVE METABOLIC PANEL
AG Ratio: 2 (calc) (ref 1.0–2.5)
ALT: 13 U/L (ref 9–46)
AST: 18 U/L (ref 10–35)
Albumin: 4 g/dL (ref 3.6–5.1)
Alkaline phosphatase (APISO): 92 U/L (ref 35–144)
BUN/Creatinine Ratio: 12 (calc) (ref 6–22)
BUN: 22 mg/dL (ref 7–25)
CO2: 23 mmol/L (ref 20–32)
Calcium: 8.7 mg/dL (ref 8.6–10.3)
Chloride: 109 mmol/L (ref 98–110)
Creat: 1.82 mg/dL — ABNORMAL HIGH (ref 0.70–1.28)
Globulin: 2 g/dL (calc) (ref 1.9–3.7)
Glucose, Bld: 111 mg/dL — ABNORMAL HIGH (ref 65–99)
Potassium: 3.9 mmol/L (ref 3.5–5.3)
Sodium: 140 mmol/L (ref 135–146)
Total Bilirubin: 0.3 mg/dL (ref 0.2–1.2)
Total Protein: 6 g/dL — ABNORMAL LOW (ref 6.1–8.1)

## 2021-05-01 LAB — HEMOGLOBIN A1C
Hgb A1c MFr Bld: 6.7 % of total Hgb — ABNORMAL HIGH (ref <5.7)
Mean Plasma Glucose: 146 mg/dL
eAG (mmol/L): 8.1 mmol/L

## 2021-05-07 ENCOUNTER — Ambulatory Visit (INDEPENDENT_AMBULATORY_CARE_PROVIDER_SITE_OTHER): Payer: Medicare Other | Admitting: Family Medicine

## 2021-05-07 ENCOUNTER — Other Ambulatory Visit: Payer: Self-pay | Admitting: Family Medicine

## 2021-05-07 VITALS — BP 122/58 | HR 56 | Temp 97.3°F | Ht 68.0 in | Wt 210.2 lb

## 2021-05-07 DIAGNOSIS — Z122 Encounter for screening for malignant neoplasm of respiratory organs: Secondary | ICD-10-CM

## 2021-05-07 DIAGNOSIS — N401 Enlarged prostate with lower urinary tract symptoms: Secondary | ICD-10-CM

## 2021-05-07 DIAGNOSIS — N183 Chronic kidney disease, stage 3 unspecified: Secondary | ICD-10-CM | POA: Diagnosis not present

## 2021-05-07 DIAGNOSIS — R0989 Other specified symptoms and signs involving the circulatory and respiratory systems: Secondary | ICD-10-CM | POA: Diagnosis not present

## 2021-05-07 DIAGNOSIS — E1159 Type 2 diabetes mellitus with other circulatory complications: Secondary | ICD-10-CM

## 2021-05-07 DIAGNOSIS — Z125 Encounter for screening for malignant neoplasm of prostate: Secondary | ICD-10-CM

## 2021-05-07 DIAGNOSIS — R3911 Hesitancy of micturition: Secondary | ICD-10-CM | POA: Diagnosis not present

## 2021-05-07 DIAGNOSIS — F17210 Nicotine dependence, cigarettes, uncomplicated: Secondary | ICD-10-CM | POA: Diagnosis not present

## 2021-05-07 DIAGNOSIS — E1122 Type 2 diabetes mellitus with diabetic chronic kidney disease: Secondary | ICD-10-CM

## 2021-05-07 DIAGNOSIS — I2510 Atherosclerotic heart disease of native coronary artery without angina pectoris: Secondary | ICD-10-CM

## 2021-05-07 DIAGNOSIS — I10 Essential (primary) hypertension: Secondary | ICD-10-CM | POA: Diagnosis not present

## 2021-05-07 NOTE — Progress Notes (Signed)
? ?Subjective:  ? ? Patient ID: James Jones, male    DOB: 1945/07/13, 76 y.o.   MRN: [id number] ? ?HPI ?Patient is a very pleasant 76 year old African-American gentleman who is here today for his regular checkup.  He has a past medical history of type 2 diabetes mellitus which is well controlled with an A1c of 6.7.  However he also has stage IIIb chronic kidney disease and is the reason he is on [place].  He denies any frequency.  He denies any urgency.  He denies any dysuria but he does occasionally have hesitancy.  He is overdue for a PSA to screen for prostate cancer.  He is also overdue for a CT to screen for lung cancer given his history of smoking.  He continues to smoke marijuana and quit smoking cigarettes within the last few years.  He denies any chest pain or shortness of breath.  He does have 3/6 systolic ejection murmur consistent with aortic stenosis.  His cardiologist is recently scheduled him for an echocardiogram to monitor this.  He has prominent left-sided carotid bruit which may be due to the aortic stenosis but he has not had carotid Dopplers.  He denies any claudication.  Diabetic foot exam was performed today and is normal.  He denies any neuropathy in his feet. ?Abstract on 05/01/2021  ?Component Date Value Ref Range Status  ? HM Diabetic Eye Exam 04/29/2021 No Retinopathy  No Retinopathy Final  ?Lab on 04/30/2021  ?Component Date Value Ref Range Status  ? Hgb A1c MFr Bld 04/30/2021 6.7 (H)  <5.7 % of total Hgb Final  ? Comment: For someone without known diabetes, a hemoglobin A1c ?value of 6.5% or greater indicates that they may have  ?diabetes and this should be confirmed with a follow-up  ?test. ?. ?For someone with known diabetes, a value <7% indicates  ?that their diabetes is well controlled and a value  ?greater than or equal to 7% indicates suboptimal  ?control. A1c targets should be individualized based on  ?duration of diabetes, age, comorbid conditions, and  ?other  considerations. ?. ?Currently, no consensus exists regarding use of ?hemoglobin A1c for diagnosis of diabetes for children. ?. ?  ? Mean Plasma Glucose 04/30/2021 146  mg/dL Final  ? eAG (mmol/L) 04/30/2021 8.1  mmol/L Final  ? Glucose, Bld 04/30/2021 111 (H)  65 - 99 mg/dL Final  ? Comment: . ?           Fasting reference interval ?. ?For someone without known diabetes, a glucose value ?between 100 and 125 mg/dL is consistent with ?prediabetes and should be confirmed with a ?follow-up test. ?. ?  ? BUN 04/30/2021 22  7 - 25 mg/dL Final  ? Creat 04/30/2021 1.82 (H)  0.70 - 1.28 mg/dL Final  ? BUN/Creatinine Ratio 04/30/2021 12  6 - 22 (calc) Final  ? Sodium 04/30/2021 140  135 - 146 mmol/L Final  ? Potassium 04/30/2021 3.9  3.5 - 5.3 mmol/L Final  ? Chloride 04/30/2021 109  98 - 110 mmol/L Final  ? CO2 04/30/2021 23  20 - 32 mmol/L Final  ? Calcium 04/30/2021 8.7  8.6 - 10.3 mg/dL Final  ? Total Protein 04/30/2021 6.0 (L)  6.1 - 8.1 g/dL Final  ? Albumin 04/30/2021 4.0  3.6 - 5.1 g/dL Final  ? Globulin 04/30/2021 2.0  1.9 - 3.7 g/dL (calc) Final  ? AG Ratio 04/30/2021 2.0  1.0 - 2.5 (calc) Final  ? Total Bilirubin 04/30/2021 0.3  0.2 -  1.2 mg/dL Final  ? Alkaline phosphatase (APISO) 04/30/2021 92  35 - 144 U/L Final  ? AST 04/30/2021 18  10 - 35 U/L Final  ? ALT 04/30/2021 13  9 - 46 U/L Final  ? ? ?Past Medical History:  ?Diagnosis Date  ? Aortic stenosis 04/02/2021  ? CKD (chronic kidney disease) stage 2, GFR 60-89 ml/min 03/17/2012  ? Coronary artery disease, non-occlusive 03/18/2012  ? 60-70% stenosis in RCA -- FFR 0.84; EF 60-65%  ? Diabetes mellitus without complication ([place])   ? GERD (gastroesophageal reflux disease)   ? On PPI  ? History of GI bleed January 2015  ? No obvious findings on EGD/colonoscopy  ? Hydrocele, bilateral   ? large on [place] 2020  ? Hyperlipidemia   ? Hypertension   ? Obesity (BMI 30.0-34.9) 10/02/2012  ? OSA (obstructive sleep apnea) 03/18/2012  ? Doing better with CPAP  ? Pulmonary hypertension  ([place]) 12/10/2010  ? ECHO:  Mild PH,mild LVH; PA pressures estimated 30-40 mmHg  ? 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  ? Event monitor; usually during sleeping hours; therefore not on beta blocker  ? ?Past Surgical History:  ?Procedure Laterality Date  ? APPENDECTOMY  1974  ? CARPAL TUNNEL RELEASE    ? EYE SURGERY  1960  ? Left eye  ? HEMORRHOID SURGERY    ? LEFT HEART CATH AND CORONARY ANGIOGRAPHY  03/27/2010  ? Moderate mid RCA lesion,right radial approach,normal EF  ? LEFT HEART CATHETERIZATION WITH CORONARY ANGIOGRAM N/A 03/17/2012  ? Procedure: LEFT HEART CATHETERIZATION WITH CORONARY ANGIOGRAM;  Surgeon: Leonie Man, MD;  Location: [facility] CATH LAB;  Service: Cardiovascular: Fractional Flow Reserve Measurement of mid RCA 60-70% stenosis = 0.84. Otherwise mild LCA CAD.  Normal EF & EDP  ? SHOULDER ARTHROSCOPY  2006  ? TRANSTHORACIC ECHOCARDIOGRAM  12/2010  ?  Mild concentric LVH.  EF> 55%.  GR 1 DD.  Mild aortic sclerosis no stenosis.  PA pressures estimated 30-40 mmHg  ? ?Current Outpatient Medications on File Prior to Visit  ?Medication Sig Dispense Refill  ? allopurinol (ZYLOPRIM) 100 MG tablet Take 1 tablet (100 mg total) by mouth daily. 90 tablet 3  ? amLODipine (NORVASC) 10 MG tablet Take 1 tablet (10 mg total) by mouth daily. 90 tablet 3  ? Blood Glucose Monitoring Suppl (FREESTYLE LITE) DEVI Use to check blood sugar once daily 1 each 0  ? Cholecalciferol (VITAMIN D-3) 1000 UNITS CAPS Take by mouth daily. 2 capsules.    ? dapagliflozin propanediol (FARXIGA) 10 MG TABS tablet Take 1 tablet (10 mg total) by mouth daily before breakfast. 90 tablet 3  ? doxazosin (CARDURA) 4 MG tablet Take 1 tablet (4 mg total) by mouth 2 (two) times daily. 180 tablet 3  ? ferrous sulfate 325 (65 FE) MG tablet Take 1 tablet (325 mg total) by mouth 2 (two) times daily with a meal. 180 tablet 3  ? fish oil-omega-3 fatty acids 1000 MG capsule Take 1,200 mg by mouth daily.     ? glipiZIDE (GLUCOTROL XL) 2.5 MG 24  hr tablet Take 1 tablet (2.5 mg total) by mouth daily with breakfast. 90 tablet 3  ? glucose blood (FREESTYLE LITE) test strip USE TO CHECK BLOOD SUGAR 1-2x DAILY. E11.59 200 strip 12  ? Multiple Vitamin (MULTIVITAMIN WITH MINERALS) TABS Take 1 tablet by mouth daily.    ? pantoprazole (PROTONIX) 40 MG tablet TAKE 1 TABLET BY MOUTH EVERY DAY 90 tablet 3  ? Potassium 99  MG TABS Take 99 mg by mouth daily.    ? PRESCRIPTION MEDICATION Uses C-PAP at bedtime    ? rosuvastatin (CRESTOR) 40 MG tablet Take 1 tablet (40 mg total) by mouth daily. 90 tablet 3  ? valsartan (DIOVAN) 320 MG tablet TAKE 1 TABLET BY MOUTH EVERY DAY 90 tablet 1  ? carvedilol (COREG) 25 MG tablet Take 1 tablet (25 mg total) by mouth 2 (two) times daily. 180 tablet 3  ? ?No current facility-administered medications on file prior to visit.  ? ?Allergies  ?Allergen Reactions  ? Atorvastatin   ?  myalgias  ? ?Social History  ? ?Socioeconomic History  ? Marital status: Married  ?  Spouse name: Not on file  ? Number of children: Not on file  ? Years of education: Not on file  ? Highest education level: Not on file  ?Occupational History  ? Not on file  ?Tobacco Use  ? Smoking status: Former  ?  Packs/day: 1.00  ?  Types: Cigarettes  ?  Quit date: 04/12/2012  ?  Years since quitting: 9.0  ? Smokeless tobacco: Never  ?Substance and Sexual Activity  ? Alcohol use: Yes  ?  Alcohol/week: 1.0 standard drink  ?  Types: 1 Standard drinks or equivalent per week  ? Drug use: Yes  ?  Types: Marijuana  ?  Comment: cannibus  ? Sexual activity: Not on file  ?Other Topics Concern  ? Not on file  ?Social History Narrative  ? Married, father of 2, grandfather of 3.  ? He is a former smoker of about a pack to pack and half cigarettes a day -- he quit in March of this year.   ? He is an avid exerciser working at least 4-5 days a week doing her walking or stationary bike.  ? ?Social Determinants of Health  ? ?Financial Resource Strain: Medium Risk  ? Difficulty of Paying Living  Expenses: Somewhat hard  ?Food Insecurity: No Food Insecurity  ? Worried About Charity fundraiser in the Last Year: Never true  ? Ran Out of Food in the Last Year: Never true  ?Transportation Needs: No Transportation

## 2021-05-08 LAB — PSA: PSA: 0.97 ng/mL (ref <=4.00)

## 2021-05-12 ENCOUNTER — Ambulatory Visit
Admission: RE | Admit: 2021-05-12 | Discharge: 2021-05-12 | Disposition: A | Discharge status: Home / Self Care | Payer: Medicare Other | Source: Ambulatory Visit | Attending: Family Medicine | Admitting: Family Medicine

## 2021-05-12 DIAGNOSIS — R0989 Other specified symptoms and signs involving the circulatory and respiratory systems: Secondary | ICD-10-CM

## 2021-05-12 DIAGNOSIS — E7800 Pure hypercholesterolemia, unspecified: Secondary | ICD-10-CM | POA: Diagnosis not present

## 2021-05-12 DIAGNOSIS — I6523 Occlusion and stenosis of bilateral carotid arteries: Secondary | ICD-10-CM | POA: Diagnosis not present

## 2021-05-12 DIAGNOSIS — I10 Essential (primary) hypertension: Secondary | ICD-10-CM | POA: Diagnosis not present

## 2021-05-14 ENCOUNTER — Telehealth: Payer: Self-pay | Admitting: Pharmacist

## 2021-05-14 ENCOUNTER — Other Ambulatory Visit (HOSPITAL_BASED_OUTPATIENT_CLINIC_OR_DEPARTMENT_OTHER): Payer: Medicare Other

## 2021-05-14 ENCOUNTER — Telehealth: Payer: Self-pay | Admitting: Student

## 2021-05-14 ENCOUNTER — Other Ambulatory Visit: Payer: Self-pay | Admitting: Family Medicine

## 2021-05-14 DIAGNOSIS — R590 Localized enlarged lymph nodes: Secondary | ICD-10-CM

## 2021-05-14 NOTE — Progress Notes (Signed)
error 

## 2021-05-14 NOTE — Progress Notes (Signed)
? ? ?Chronic Care Management ?Pharmacy Assistant  ? ?Name: James Jones  MRN: [id number] DOB: 1945-10-30 ? ? ?Reason for Encounter: Disease State - Diabetes Call  ?  ? ?Recent office visits:  ?05/07/21 Jenna Luo, MD - Family Medicine - Hypertension - Labs were ordered. CT chest and Carotid duplex ordered. Follow up as scheduled.  ? ?04/23/21 Annual Medicare Wellness Completed ? ? ?Recent consult visits:  ?04/29/21 Skeet Latch, MD - ECHO ordered. No medication changes. Follow up in 6 months.  ? ?Hospital visits:  ?None in previous 6 months ? ?Medications: ?Outpatient Encounter Medications as of 05/14/2021  ?Medication Sig  ? allopurinol (ZYLOPRIM) 100 MG tablet TAKE 1 TABLET BY MOUTH EVERY DAY  ? amLODipine (NORVASC) 10 MG tablet Take 1 tablet (10 mg total) by mouth daily.  ? Blood Glucose Monitoring Suppl (FREESTYLE LITE) DEVI Use to check blood sugar once daily  ? carvedilol (COREG) 25 MG tablet Take 1 tablet (25 mg total) by mouth 2 (two) times daily.  ? Cholecalciferol (VITAMIN D-3) 1000 UNITS CAPS Take by mouth daily. 2 capsules.  ? dapagliflozin propanediol (FARXIGA) 10 MG TABS tablet Take 1 tablet (10 mg total) by mouth daily before breakfast.  ? doxazosin (CARDURA) 4 MG tablet Take 1 tablet (4 mg total) by mouth 2 (two) times daily.  ? ferrous sulfate 325 (65 FE) MG tablet Take 1 tablet (325 mg total) by mouth 2 (two) times daily with a meal.  ? fish oil-omega-3 fatty acids 1000 MG capsule Take 1,200 mg by mouth daily.   ? glipiZIDE (GLUCOTROL XL) 2.5 MG 24 hr tablet Take 1 tablet (2.5 mg total) by mouth daily with breakfast.  ? glucose blood (FREESTYLE LITE) test strip USE TO CHECK BLOOD SUGAR 1-2x DAILY. E11.59  ? Multiple Vitamin (MULTIVITAMIN WITH MINERALS) TABS Take 1 tablet by mouth daily.  ? pantoprazole (PROTONIX) 40 MG tablet TAKE 1 TABLET BY MOUTH EVERY DAY  ? Potassium 99 MG TABS Take 99 mg by mouth daily.  ? PRESCRIPTION MEDICATION Uses C-PAP at bedtime  ? rosuvastatin (CRESTOR) 40 MG tablet  Take 1 tablet (40 mg total) by mouth daily.  ? valsartan (DIOVAN) 320 MG tablet TAKE 1 TABLET BY MOUTH EVERY DAY  ? ?No facility-administered encounter medications on file as of 05/14/2021.  ? ? ?Current antihyperglycemic regimen:  ?Glipizide XL 2.5mg  daily ?Farxiga 10mg  daily ?  ?  ?What recent interventions/DTPs have been made to improve glycemic control:  ?Patient denied any recent medication changes.  ?  ?Have there been any recent hospitalizations or ED visits since last visit with CPP?  ?Patient has not had any hospitalizations or ED visits since last visit with CPP.  ?  ?Patient denies hypoglycemic symptoms, including Pale, Sweaty, Shaky, Hungry, Nervous/irritable, and Vision changes ?  ?  ?Patient denies hyperglycemic symptoms, including blurry vision, excessive thirst, fatigue, polyuria, and weakness ?  ?  ?How often are you checking your blood sugar? ?Patient reported checking blood sugars every morning.  ?             ?What are your blood sugars ranging?  ?Fasting: 100-115 ?Before meals:  ?After meals:  ?Bedtime:  ?  ?During the week, how often does your blood glucose drop below 70? ? Patient denied any readings below 70 in a long time reported.  ?  ?Are you checking your feet daily/regularly? ?Patient does check his feet regularly and currently denies and concerns at the moment.  ?  ?  ?Adherence Review: ?Is the  patient currently on a STATIN medication? Yes ?Is the patient currently on ACE/ARB medication? Yes ?Does the patient have >5 day gap between last estimated fill dates? No ?  ?  ?Care Gaps ?  ?AWV: done 04/23/21 ?Colonoscopy: unknown  ?DM Eye Exam: due 04/28/21 ?DM Foot Exam:  due 04/15/21 ?Microalbumin: done 04/11/20 ?HbgAIC: done 04/30/21 (6.7) ?DEXA: N/A ?Mammogram: N/A ?  ?  ?Star Rating Drugs: ?Valsartan 320 mg - last filled 04/28/21 90 days  ?Dapagliflozin 10 mg - last filled 04/08/21 90 days  ?Rosuvastatin 20 mg - last filled 05/07/21 90 days ? ? ?Future Appointments  ?Date Time Provider [place]  ?09/08/2021  9:00 AM Philemon Kingdom, MD [id number] None  ?11/03/2021 10:00 AM Skeet Latch, MD [facility]  ?04/29/2022  9:00 AM [facility]SE HEALTH ADVISOR [facility]  ? ? ? ?Liza Showfety, CCMA ?Clinical Pharmacist Assistant  ?[phone] ? ?

## 2021-05-15 DIAGNOSIS — Z20822 Contact with and (suspected) exposure to covid-19: Secondary | ICD-10-CM | POA: Diagnosis not present

## 2021-05-22 ENCOUNTER — Other Ambulatory Visit (HOSPITAL_BASED_OUTPATIENT_CLINIC_OR_DEPARTMENT_OTHER): Payer: Medicare Other

## 2021-05-26 ENCOUNTER — Ambulatory Visit (INDEPENDENT_AMBULATORY_CARE_PROVIDER_SITE_OTHER): Payer: Medicare Other

## 2021-05-26 DIAGNOSIS — I350 Nonrheumatic aortic (valve) stenosis: Secondary | ICD-10-CM | POA: Diagnosis not present

## 2021-05-26 LAB — ECHOCARDIOGRAM COMPLETE
AR max vel: 1.71 cm2
AV Area VTI: 1.93 cm2
AV Area mean vel: 1.8 cm2
AV Mean grad: 9 mmHg
AV Peak grad: 19.9 mmHg
Ao pk vel: 2.23 m/s
Area-P 1/2: 3.46 cm2
Calc EF: 66.5 %
S' Lateral: 2.89 cm
Single Plane A2C EF: 66.3 %
Single Plane A4C EF: 65.5 %

## 2021-06-04 ENCOUNTER — Ambulatory Visit (INDEPENDENT_AMBULATORY_CARE_PROVIDER_SITE_OTHER): Payer: Medicare Other | Admitting: Cardiovascular Disease

## 2021-06-04 ENCOUNTER — Encounter (HOSPITAL_BASED_OUTPATIENT_CLINIC_OR_DEPARTMENT_OTHER): Payer: Self-pay | Admitting: Cardiovascular Disease

## 2021-06-04 DIAGNOSIS — G4733 Obstructive sleep apnea (adult) (pediatric): Secondary | ICD-10-CM

## 2021-06-04 DIAGNOSIS — I2510 Atherosclerotic heart disease of native coronary artery without angina pectoris: Secondary | ICD-10-CM

## 2021-06-04 DIAGNOSIS — I272 Pulmonary hypertension, unspecified: Secondary | ICD-10-CM

## 2021-06-04 DIAGNOSIS — I10 Essential (primary) hypertension: Secondary | ICD-10-CM | POA: Diagnosis not present

## 2021-06-04 DIAGNOSIS — E785 Hyperlipidemia, unspecified: Secondary | ICD-10-CM | POA: Diagnosis not present

## 2021-06-04 DIAGNOSIS — I350 Nonrheumatic aortic (valve) stenosis: Secondary | ICD-10-CM | POA: Diagnosis not present

## 2021-06-04 DIAGNOSIS — Z9989 Dependence on other enabling machines and devices: Secondary | ICD-10-CM | POA: Diagnosis not present

## 2021-06-04 HISTORY — DX: Pulmonary hypertension, unspecified: I27.20

## 2021-06-04 MED ORDER — DOXAZOSIN MESYLATE 2 MG PO TABS: 2.0000 mg | ORAL_TABLET | Freq: Two times a day (BID) | ORAL | 3 refills | Status: DC

## 2021-06-04 NOTE — Assessment & Plan Note (Signed)
Blood pressure in the office was okay, but at home it has been elevated.  Continue amlodipine, carvedilol, valsartan, and increase doxazosin to 6 mg twice daily. ?

## 2021-06-04 NOTE — Assessment & Plan Note (Signed)
Mild aortic stenosis on echo 04/2021. ?

## 2021-06-04 NOTE — Progress Notes (Signed)
?Cardiology Office Note:   ? ?Date:  06/04/2021  ? ?ID:  James Jones, DOB [birth year], MRN [id number] ? ?PCP:  Susy Frizzle, MD ?  ?[facility] Providers ?Cardiologist:  Skeet Latch, MD    ? ?Referring MD: Susy Frizzle, MD  ? ?No chief complaint on file. ? ?History of Present Illness:   ? ?James Jones is a 76 y.o. male with a hx of mild aortic stenosis, CKD stage 2, non-occlusive CAD, diabetes mellitus, GERD, hyperlipidemia, hypertension, OSA on CPAP, who is here for follow-up. He was previously a patient of Dr. Ellyn Hack, who last saw him 05/2019 for follow-up. At that time he was doing well but complained of a lack of energy. He had a remote cardiac cath 03/2012 that revealed 60-70% RCA stenosis. He had myalgias on atorvastatin but has tolerated rosuvastatin. ? ?He was noted to have a systolic murmur and was referred for an echo 10/2020 that revealed LVEF 60 to 65% with normal diastolic function.  PASP was moderately elevated at 47 mmHg.  He had mild aortic stenosis.  Mean gradient was 14.2 mmHg.  He followed up with Laurann Montana, NP 11/25/20 and his blood pressure was well controlled on valsartan, coreg, amlodipine, and doxazosin. His home blood pressures were uncontrolled, so doxazosin was increased.  He followed up 04/2021 and his blood pressures were controlled.  He saw his PCP later that month and a carotid bruit was noted.  He had carotid Dopplers 05/2021 that revealed minor carotid atherosclerosis.  There was a concern for supraclavicular adenopathy.  He has a chest CT pending.  He had an echo 05/2021 that revealed LVEF 60 to 65% with mild LVH and grade 2 diastolic dysfunction.  Right ventricular function was normal but his PASP was 60 mmHg.  Right atrial pressure was 15 mmHg.  He had mild aortic stenosis with a mean gradient of 9 mmHg. ? ?Overall Mr. Halvorson continues to feel well.  He exercises at the [facility] to 3 days/week and has no problems on the elliptical.  However he was surprised that last  month he went to [facility] and felt very short of breath when walking.  He had to stop several times, which is unusual for him.  He has not had any chest pain or pressure.  He denies lower extremity edema, orthopnea, or PND.  He notes a long history of smoking off and on.  He suddenly quit for good 5 years ago.  He has no history of joint swelling or rashes.  He has not had any unusual weight loss recently.  He had normal ANA and rheumatoid factor levels in 2020.  At home his blood pressure has been in the [id number] to 0000000 systolic.  The diastolic blood pressures have been at goal. ? ? ?Past Medical History:  ?Diagnosis Date  ? Aortic stenosis 04/02/2021  ? CKD (chronic kidney disease) stage 2, GFR 60-89 ml/min 03/17/2012  ? Coronary artery disease, non-occlusive 03/18/2012  ? 60-70% stenosis in RCA -- FFR 0.84; EF 60-65%  ? Diabetes mellitus without complication ([place])   ? GERD (gastroesophageal reflux disease)   ? On PPI  ? History of GI bleed January 2015  ? No obvious findings on EGD/colonoscopy  ? Hydrocele, bilateral   ? large on [place] 2020  ? Hyperlipidemia   ? Hypertension   ? Obesity (BMI 30.0-34.9) 10/02/2012  ? OSA (obstructive sleep apnea) 03/18/2012  ? Doing better with CPAP  ? Pulmonary hypertension ([place]) 12/10/2010  ? ECHO:  Mild PH,mild LVH; PA pressures estimated 30-40 mmHg  ? Pulmonary hypertension, unspecified ([place]) 06/04/2021  ? 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  ? Event monitor; usually during sleeping hours; therefore not on beta blocker  ? ? ?Past Surgical History:  ?Procedure Laterality Date  ? APPENDECTOMY  1974  ? CARPAL TUNNEL RELEASE    ? EYE SURGERY  1960  ? Left eye  ? HEMORRHOID SURGERY    ? LEFT HEART CATH AND CORONARY ANGIOGRAPHY  03/27/2010  ? Moderate mid RCA lesion,right radial approach,normal EF  ? LEFT HEART CATHETERIZATION WITH CORONARY ANGIOGRAM N/A 03/17/2012  ? Procedure: LEFT HEART CATHETERIZATION WITH CORONARY ANGIOGRAM;  Surgeon: Leonie Man, MD;  Location: [facility]  CATH LAB;  Service: Cardiovascular: Fractional Flow Reserve Measurement of mid RCA 60-70% stenosis = 0.84. Otherwise mild LCA CAD.  Normal EF & EDP  ? SHOULDER ARTHROSCOPY  2006  ? TRANSTHORACIC ECHOCARDIOGRAM  12/2010  ?  Mild concentric LVH.  EF> 55%.  GR 1 DD.  Mild aortic sclerosis no stenosis.  PA pressures estimated 30-40 mmHg  ? ? ?Current Medications: ?Current Meds  ?Medication Sig  ? allopurinol (ZYLOPRIM) 100 MG tablet TAKE 1 TABLET BY MOUTH EVERY DAY  ? amLODipine (NORVASC) 10 MG tablet Take 1 tablet (10 mg total) by mouth daily.  ? Blood Glucose Monitoring Suppl (FREESTYLE LITE) DEVI Use to check blood sugar once daily  ? carvedilol (COREG) 25 MG tablet Take 1 tablet (25 mg total) by mouth 2 (two) times daily.  ? Cholecalciferol (VITAMIN D-3) 1000 UNITS CAPS Take by mouth daily. 2 capsules.  ? dapagliflozin propanediol (FARXIGA) 10 MG TABS tablet Take 1 tablet (10 mg total) by mouth daily before breakfast.  ? doxazosin (CARDURA) 2 MG tablet Take 1 tablet (2 mg total) by mouth 2 (two) times daily. TO BE TAKEN WITH 4 MG TABLET FOR TOTAL OF 6 MG  TWICE A DAY  ? doxazosin (CARDURA) 4 MG tablet Take 1 tablet (4 mg total) by mouth 2 (two) times daily.  ? ferrous sulfate 325 (65 FE) MG tablet Take 1 tablet (325 mg total) by mouth 2 (two) times daily with a meal.  ? fish oil-omega-3 fatty acids 1000 MG capsule Take 1,200 mg by mouth daily.   ? glipiZIDE (GLUCOTROL XL) 2.5 MG 24 hr tablet Take 1 tablet (2.5 mg total) by mouth daily with breakfast.  ? glucose blood (FREESTYLE LITE) test strip USE TO CHECK BLOOD SUGAR 1-2x DAILY. E11.59  ? Multiple Vitamin (MULTIVITAMIN WITH MINERALS) TABS Take 1 tablet by mouth daily.  ? pantoprazole (PROTONIX) 40 MG tablet TAKE 1 TABLET BY MOUTH EVERY DAY  ? Potassium 99 MG TABS Take 99 mg by mouth daily.  ? PRESCRIPTION MEDICATION Uses C-PAP at bedtime  ? rosuvastatin (CRESTOR) 40 MG tablet Take 1 tablet (40 mg total) by mouth daily.  ? valsartan (DIOVAN) 320 MG tablet TAKE 1  TABLET BY MOUTH EVERY DAY  ?  ? ?Allergies:   Atorvastatin  ? ?Social History  ? ?Socioeconomic History  ? Marital status: Married  ?  Spouse name: Not on file  ? Number of children: Not on file  ? Years of education: Not on file  ? Highest education level: Not on file  ?Occupational History  ? Not on file  ?Tobacco Use  ? Smoking status: Former  ?  Packs/day: 1.00  ?  Types: Cigarettes  ?  Quit date: 04/12/2012  ?  Years since quitting: 9.1  ? Smokeless tobacco:  Never  ?Substance and Sexual Activity  ? Alcohol use: Yes  ?  Alcohol/week: 1.0 standard drink  ?  Types: 1 Standard drinks or equivalent per week  ? Drug use: Yes  ?  Types: Marijuana  ?  Comment: cannibus  ? Sexual activity: Not on file  ?Other Topics Concern  ? Not on file  ?Social History Narrative  ? Married, father of 2, grandfather of 3.  ? He is a former smoker of about a pack to pack and half cigarettes a day -- he quit in March of this year.   ? He is an avid exerciser working at least 4-5 days a week doing her walking or stationary bike.  ? ?Social Determinants of Health  ? ?Financial Resource Strain: Medium Risk  ? Difficulty of Paying Living Expenses: Somewhat hard  ?Food Insecurity: No Food Insecurity  ? Worried About Charity fundraiser in the Last Year: Never true  ? Ran Out of Food in the Last Year: Never true  ?Transportation Needs: No Transportation Needs  ? Lack of Transportation (Medical): No  ? Lack of Transportation (Non-Medical): No  ?Physical Activity: Sufficiently Active  ? Days of Exercise per Week: 3 days  ? Minutes of Exercise per Session: 60 min  ?Stress: No Stress Concern Present  ? Feeling of Stress : Only a little  ?Social Connections: Socially Integrated  ? Frequency of Communication with Friends and Family: More than three times a week  ? Frequency of Social Gatherings with Friends and Family: More than three times a week  ? Attends Religious Services: More than 4 times per year  ? Active Member of Clubs or Organizations:  Yes  ? Attends Archivist Meetings: More than 4 times per year  ? Marital Status: Married  ?  ? ?Family History: ?The patient's family history includes Coronary artery disease in his father; Hyp

## 2021-06-04 NOTE — Patient Instructions (Signed)
Medication Instructions:  ?INCREASE YOUR DOXAZOSIN TO 6 MG TWICE A DAY  ? ?*If you need a refill on your cardiac medications before your next appointment, please call your pharmacy* ? ?Lab Work: ?NONE  ? ?Testing/Procedures: ?VQ SCAN  ? ?CHEST XRAY  ? ?Your physician has recommended that you have a pulmonary function test. Pulmonary Function Tests are a group of tests that measure how well air moves in and out of your lungs. ? ?Follow-Up: ?At [facility], you and your health needs are our priority.  As part of our continuing mission to provide you with exceptional heart care, we have created designated Provider Care Teams.  These Care Teams include your primary Cardiologist (physician) and Advanced Practice Providers (APPs -  Physician Assistants and Nurse Practitioners) who all work together to provide you with the care you need, when you need it. ? ?We recommend signing up for the patient portal called "MyChart".  Sign up information is provided on this After Visit Summary.  MyChart is used to connect with patients for Virtual Visits (Telemedicine).  Patients are able to view lab/test results, encounter notes, upcoming appointments, etc.  Non-urgent messages can be sent to your provider as well.   ?To learn more about what you can do with MyChart, go to [email or web address].   ? ?Your next appointment:   ?6 month(s) ? ?The format for your next appointment:   ?In Person ? ?Provider:   ?Chilton Si, MD ?  ?2 MONTHS WITH CAITLIN W NP  ? ? ? Ventilation Perfusion Scan ? ?A ventilation-perfusion scan is a procedure to look at airflow and blood flow in the lungs. It is most often done to look for a blood clot that may have traveled to the lungs (pulmonary embolus). ?During this scan, radioactive compounds are injected into the bloodstream and are also breathed in. The compounds are not harmful. They are given at very low doses and stay in the body for a short time. After the compounds are injected and  breathed in, a camera is used to scan the lungs. ?Tell a health care provider about: ?Any allergies you have. ?All medicines you are taking, including vitamins, herbs, eye drops, creams, and over-the-counter medicines. ?Any blood disorders you have. ?Any surgeries you have had. ?Any medical conditions you have. ?Whether you are pregnant or may be pregnant. ?Whether or not you are breastfeeding. ?What are the risks? ?Generally, this is a safe procedure. However, problems may occur, including an allergic reaction to the radioactive compounds. ?What happens before the procedure? ?Do not use any products that contain nicotine or tobacco before the procedure. These products include cigarettes, chewing tobacco, and vaping devices, such as e-cigarettes. If you need help quitting, ask your health care provider. ?Ask your health care provider about changing or stopping your regular medicines. This is especially important if you are taking diabetes medicines or blood thinners. ?What happens during the procedure? ?An IV will be inserted into one of your veins. The IV will stay in place for the entire exam. ?A small amount of radioactive material will be injected into your bloodstream. ?Your lungs will be scanned with a camera. This camera will record the images. ?You will be asked to inhale a second radioactive compound. Take deep breaths as directed by the health care provider. ?Your lungs will be scanned again. ?Your IV will be removed. ?The procedure may vary among health care providers and hospitals. ?What can I expect after the procedure? ?You may go home unless your health  care provider instructs you differently. ?You may continue with your normal activities and diet as instructed by your health care provider. ?It is up to you to get the results of your procedure. Ask your health care provider, or the department that is doing the procedure, when your results will be ready. ?Summary ?A ventilation-perfusion scan is a  procedure to look at airflow and blood flow in the lungs. It is most often done to look for blood clots that may have traveled to the lungs. ?The radioactive compounds used during the procedure are not harmful. ?Your lungs will be scanned with a camera. This camera will record the images. ?It is up to you to get the results of your procedure. Ask your health care provider, or the department that is doing the procedure, when your results will be ready. ?This information is not intended to replace advice given to you by your health care provider. Make sure you discuss any questions you have with your health care provider. ?Document Revised: 10/08/2020 Document Reviewed: 12/28/2019 ?Elsevier Patient Education ? 2023 Elsevier Inc. ? ? ? ?

## 2021-06-04 NOTE — Assessment & Plan Note (Signed)
PASP was 60 mmHg on echo.  In general he does not have any heart failure symptoms.  He did have exertional dyspnea recently which was new for him.  He does not have any history of any autoimmune disease and his overall seems unlikely given his lack of symptoms.  LFTs and TFTs tees have been within normal limits.  We will get a V/q. scan to rule out chronic thromboembolic pulmonary disease.  We will also get PFTs given his smoking history.  He is consistent with using his CPAP machine.  We discussed the fact that if this work-up is unremarkable we will likely need to get a right heart catheterization.  He expressed agreement and understanding. ?

## 2021-06-04 NOTE — Assessment & Plan Note (Signed)
Lipids are well-controlled on rosuvastatin. 

## 2021-06-04 NOTE — Assessment & Plan Note (Signed)
Continue regular CPAP use 

## 2021-06-05 ENCOUNTER — Telehealth (HOSPITAL_BASED_OUTPATIENT_CLINIC_OR_DEPARTMENT_OTHER): Payer: Self-pay | Admitting: Cardiovascular Disease

## 2021-06-05 DIAGNOSIS — Z20822 Contact with and (suspected) exposure to covid-19: Secondary | ICD-10-CM | POA: Diagnosis not present

## 2021-06-05 NOTE — Telephone Encounter (Signed)
Left message for patient regarding the Tuesday 06/16/21 11:00 am PFT appointment at [facility]--arrival time is 10:45 am for check in---1st floor admissions office for check in ----will mail information to patient and requested he call with questions ?

## 2021-06-11 ENCOUNTER — Ambulatory Visit: Payer: Medicare Other

## 2021-06-11 ENCOUNTER — Ambulatory Visit (HOSPITAL_COMMUNITY)
Admission: RE | Admit: 2021-06-11 | Discharge: 2021-06-11 | Disposition: A | Discharge status: Home / Self Care | Payer: Medicare Other | Source: Ambulatory Visit | Attending: Cardiovascular Disease | Admitting: Cardiovascular Disease

## 2021-06-11 DIAGNOSIS — I270 Primary pulmonary hypertension: Secondary | ICD-10-CM | POA: Diagnosis not present

## 2021-06-11 DIAGNOSIS — I272 Pulmonary hypertension, unspecified: Secondary | ICD-10-CM | POA: Diagnosis not present

## 2021-06-11 DIAGNOSIS — I10 Essential (primary) hypertension: Secondary | ICD-10-CM | POA: Diagnosis not present

## 2021-06-11 DIAGNOSIS — Z20822 Contact with and (suspected) exposure to covid-19: Secondary | ICD-10-CM | POA: Diagnosis not present

## 2021-06-11 MED ORDER — TECHNETIUM TO 99M ALBUMIN AGGREGATED
4.4000 | Freq: Once | INTRAVENOUS | Status: AC
  Administered 2021-06-11: 4.4 via INTRAVENOUS

## 2021-06-13 DIAGNOSIS — Z20822 Contact with and (suspected) exposure to covid-19: Secondary | ICD-10-CM | POA: Diagnosis not present

## 2021-06-16 ENCOUNTER — Ambulatory Visit (HOSPITAL_COMMUNITY)
Admission: RE | Admit: 2021-06-16 | Discharge: 2021-06-16 | Disposition: A | Discharge status: Home / Self Care | Payer: Medicare Other | Source: Ambulatory Visit | Attending: Cardiovascular Disease | Admitting: Cardiovascular Disease

## 2021-06-16 DIAGNOSIS — I272 Pulmonary hypertension, unspecified: Secondary | ICD-10-CM | POA: Diagnosis not present

## 2021-06-16 LAB — PULMONARY FUNCTION TEST
DL/VA % pred: 72 %
DL/VA: 2.89 ml/min/mmHg/L
DLCO unc % pred: 61 %
DLCO unc: 14.28 ml/min/mmHg
FEF 25-75 Post: 1.37 L/sec
FEF 25-75 Pre: 1.34 L/sec
FEF2575-%Change-Post: 1 %
FEF2575-%Pred-Post: 68 %
FEF2575-%Pred-Pre: 66 %
FEV1-%Change-Post: 0 %
FEV1-%Pred-Post: 78 %
FEV1-%Pred-Pre: 78 %
FEV1-Post: 1.97 L
FEV1-Pre: 1.95 L
FEV1FVC-%Change-Post: -4 %
FEV1FVC-%Pred-Pre: 95 %
FEV6-%Change-Post: 6 %
FEV6-%Pred-Post: 89 %
FEV6-%Pred-Pre: 84 %
FEV6-Post: 2.89 L
FEV6-Pre: 2.72 L
FEV6FVC-%Pred-Post: 105 %
FEV6FVC-%Pred-Pre: 105 %
FVC-%Change-Post: 6 %
FVC-%Pred-Post: 84 %
FVC-%Pred-Pre: 79 %
FVC-Post: 2.89 L
FVC-Pre: 2.72 L
Post FEV1/FVC ratio: 68 %
Post FEV6/FVC ratio: 100 %
Pre FEV1/FVC ratio: 72 %
Pre FEV6/FVC Ratio: 100 %
RV % pred: 123 %
RV: 3.06 L
TLC % pred: 92 %
TLC: 6.12 L

## 2021-06-16 MED ORDER — ALBUTEROL SULFATE (2.5 MG/3ML) 0.083% IN NEBU
2.5000 mg | INHALATION_SOLUTION | Freq: Once | RESPIRATORY_TRACT | Status: AC
  Administered 2021-06-16: 2.5 mg via RESPIRATORY_TRACT

## 2021-06-17 ENCOUNTER — Ambulatory Visit
Admission: RE | Admit: 2021-06-17 | Discharge: 2021-06-17 | Disposition: A | Discharge status: Home / Self Care | Payer: Medicare Other | Source: Ambulatory Visit | Attending: Family Medicine | Admitting: Family Medicine

## 2021-06-17 DIAGNOSIS — I2510 Atherosclerotic heart disease of native coronary artery without angina pectoris: Secondary | ICD-10-CM | POA: Diagnosis not present

## 2021-06-17 DIAGNOSIS — J432 Centrilobular emphysema: Secondary | ICD-10-CM | POA: Diagnosis not present

## 2021-06-17 DIAGNOSIS — F17210 Nicotine dependence, cigarettes, uncomplicated: Secondary | ICD-10-CM

## 2021-06-17 DIAGNOSIS — I358 Other nonrheumatic aortic valve disorders: Secondary | ICD-10-CM | POA: Diagnosis not present

## 2021-06-17 DIAGNOSIS — Z122 Encounter for screening for malignant neoplasm of respiratory organs: Secondary | ICD-10-CM

## 2021-06-17 DIAGNOSIS — Z20828 Contact with and (suspected) exposure to other viral communicable diseases: Secondary | ICD-10-CM | POA: Diagnosis not present

## 2021-06-17 DIAGNOSIS — Z87891 Personal history of nicotine dependence: Secondary | ICD-10-CM | POA: Diagnosis not present

## 2021-06-17 DIAGNOSIS — R590 Localized enlarged lymph nodes: Secondary | ICD-10-CM | POA: Diagnosis not present

## 2021-06-17 MED ORDER — IOPAMIDOL (ISOVUE-300) INJECTION 61%
75.0000 mL | Freq: Once | INTRAVENOUS | Status: AC | PRN
  Administered 2021-06-17: 75 mL via INTRAVENOUS

## 2021-07-14 DIAGNOSIS — N1832 Chronic kidney disease, stage 3b: Secondary | ICD-10-CM | POA: Diagnosis not present

## 2021-07-17 ENCOUNTER — Telehealth: Payer: Self-pay | Admitting: Cardiovascular Disease

## 2021-07-17 NOTE — Telephone Encounter (Signed)
Pt is returning call. Req call back.  

## 2021-07-17 NOTE — Telephone Encounter (Signed)
Left message to call back  

## 2021-07-17 NOTE — Telephone Encounter (Signed)
Patient stating he is returning nurse's call.

## 2021-07-18 ENCOUNTER — Other Ambulatory Visit: Payer: Self-pay | Admitting: Family Medicine

## 2021-07-20 NOTE — Telephone Encounter (Signed)
Pharmacy requesting a lower cost alternative.

## 2021-07-23 DIAGNOSIS — K219 Gastro-esophageal reflux disease without esophagitis: Secondary | ICD-10-CM | POA: Diagnosis not present

## 2021-07-23 DIAGNOSIS — E1122 Type 2 diabetes mellitus with diabetic chronic kidney disease: Secondary | ICD-10-CM | POA: Diagnosis not present

## 2021-07-23 DIAGNOSIS — N1832 Chronic kidney disease, stage 3b: Secondary | ICD-10-CM | POA: Diagnosis not present

## 2021-07-23 DIAGNOSIS — E872 Acidosis, unspecified: Secondary | ICD-10-CM | POA: Diagnosis not present

## 2021-07-23 DIAGNOSIS — I272 Pulmonary hypertension, unspecified: Secondary | ICD-10-CM | POA: Diagnosis not present

## 2021-07-23 DIAGNOSIS — R809 Proteinuria, unspecified: Secondary | ICD-10-CM | POA: Diagnosis not present

## 2021-07-23 DIAGNOSIS — I129 Hypertensive chronic kidney disease with stage 1 through stage 4 chronic kidney disease, or unspecified chronic kidney disease: Secondary | ICD-10-CM | POA: Diagnosis not present

## 2021-07-24 ENCOUNTER — Telehealth: Payer: Self-pay

## 2021-07-24 ENCOUNTER — Telehealth: Payer: Self-pay | Admitting: Pharmacist

## 2021-07-24 NOTE — Progress Notes (Signed)
Chronic Care Management Pharmacy Assistant   Name: James Jones  MRN: [id number] DOB: Oct 03, 1945   Reason for Encounter: Disease State - Diabetes Call     Recent office visits:  None noted.   Recent consult visits:  06/04/21 Chilton Si MD - Cardiology - Pulmonary Hypertension - Chest XR ordered. EKG performed. PFTs ordered. doxazosin (CARDURA) 4 MG tablet and doxazosin (CARDURA) 2 MG tablet prescribed. Follow up in 6 months.   Hospital visits:  None in previous 6 months  Medications: Outpatient Encounter Medications as of 07/24/2021  Medication Sig   allopurinol (ZYLOPRIM) 100 MG tablet TAKE 1 TABLET BY MOUTH EVERY DAY   amLODipine (NORVASC) 10 MG tablet Take 1 tablet (10 mg total) by mouth daily.   Blood Glucose Monitoring Suppl (FREESTYLE LITE) DEVI Use to check blood sugar once daily   carvedilol (COREG) 25 MG tablet Take 1 tablet (25 mg total) by mouth 2 (two) times daily.   Cholecalciferol (VITAMIN D-3) 1000 UNITS CAPS Take by mouth daily. 2 capsules.   dapagliflozin propanediol (FARXIGA) 10 MG TABS tablet Take 1 tablet (10 mg total) by mouth daily before breakfast.   doxazosin (CARDURA) 2 MG tablet Take 1 tablet (2 mg total) by mouth 2 (two) times daily. TO BE TAKEN WITH 4 MG TABLET FOR TOTAL OF 6 MG  TWICE A DAY   doxazosin (CARDURA) 4 MG tablet Take 1 tablet (4 mg total) by mouth 2 (two) times daily.   ferrous sulfate 325 (65 FE) MG tablet Take 1 tablet (325 mg total) by mouth 2 (two) times daily with a meal.   fish oil-omega-3 fatty acids 1000 MG capsule Take 1,200 mg by mouth daily.    glipiZIDE (GLUCOTROL XL) 2.5 MG 24 hr tablet Take 1 tablet (2.5 mg total) by mouth daily with breakfast.   glucose blood (FREESTYLE LITE) test strip USE TO CHECK BLOOD SUGAR 1-2x DAILY. E11.59   Multiple Vitamin (MULTIVITAMIN WITH MINERALS) TABS Take 1 tablet by mouth daily.   omeprazole (PRILOSEC) 20 MG capsule Take 1 capsule (20 mg total) by mouth daily.   Potassium 99 MG TABS  Take 99 mg by mouth daily.   PRESCRIPTION MEDICATION Uses C-PAP at bedtime   rosuvastatin (CRESTOR) 40 MG tablet Take 1 tablet (40 mg total) by mouth daily.   valsartan (DIOVAN) 320 MG tablet TAKE 1 TABLET BY MOUTH EVERY DAY   No facility-administered encounter medications on file as of 07/24/2021.    Current antihyperglycemic regimen:  Glipizide XL 2.5mg  daily Farxiga 10mg  daily   What recent interventions/DTPs have been made to improve glycemic control:    Have there been any recent hospitalizations or ED visits since last visit with CPP?  Patient has not had any hospitalizations or ED visits since last visit since CPP.  Patient  hypoglycemic symptoms, including    Patient  hyperglycemic symptoms, including    How often are you checking your blood sugar?    What are your blood sugars ranging?  Fasting:  Before meals:  After meals:  Bedtime:   During the week, how often does your blood glucose drop below 70?    Are you checking your feet daily/regularly?     Adherence Review: Is the patient currently on a STATIN medication? Yes Is the patient currently on ACE/ARB medication? Yes Does the patient have >5 day gap between last estimated fill dates? No   Care Gaps   AWV: done 04/23/21 Colonoscopy: unknown  DM Eye Exam: due 04/28/21 DM Foot  Exam:  due 04/15/21 Microalbumin: done 04/11/20 HbgAIC: done 04/30/21 (6.7) DEXA: N/A Mammogram: N/A     Star Rating Drugs: Valsartan 320 mg - last filled 04/28/21 90 days  Dapagliflozin 10 mg - last filled 07/07/21 90 days  Rosuvastatin 20 mg - last filled 05/07/21 90 days   Future Appointments  Date Time Provider Department Center  08/04/2021 11:20 AM Alver Sorrow, NP [facility]  09/08/2021  9:00 AM Carlus Pavlov, MD [facility] None  11/03/2021 10:00 AM Chilton Si, MD [facility]  04/29/2022  9:00 AM [facility]-NURSE HEALTH ADVISOR [facility]   Multiple attempts were made to contact patient. Attempts were  unsuccessful. / ls,CMA   Eugenie Filler, [facility] Clinical Pharmacist Assistant  [phone]

## 2021-07-24 NOTE — Telephone Encounter (Signed)
Pt called to see why medication was switched from Pantoprazole(protonix 40mg ) to Omeprazole (Prilosec) 20mg .   Explained to pt that the Protonix was discontinue in 12/22; Dr. put him on Omeprazole which is also an acid reflux medication as well as the Protonix.   Advice pt to call if you experience any issue or have any concerns re Prilosec.  Pt voiced understanding and nothing at this time.

## 2021-08-04 ENCOUNTER — Ambulatory Visit (INDEPENDENT_AMBULATORY_CARE_PROVIDER_SITE_OTHER): Payer: Medicare Other | Admitting: Family

## 2021-08-04 ENCOUNTER — Encounter (HOSPITAL_BASED_OUTPATIENT_CLINIC_OR_DEPARTMENT_OTHER): Payer: Self-pay | Admitting: Family

## 2021-08-04 VITALS — BP 122/78 | HR 58 | Ht 68.0 in | Wt 211.0 lb

## 2021-08-04 DIAGNOSIS — R942 Abnormal results of pulmonary function studies: Secondary | ICD-10-CM

## 2021-08-04 DIAGNOSIS — N1832 Chronic kidney disease, stage 3b: Secondary | ICD-10-CM

## 2021-08-04 DIAGNOSIS — E785 Hyperlipidemia, unspecified: Secondary | ICD-10-CM

## 2021-08-04 DIAGNOSIS — I350 Nonrheumatic aortic (valve) stenosis: Secondary | ICD-10-CM

## 2021-08-04 DIAGNOSIS — I2510 Atherosclerotic heart disease of native coronary artery without angina pectoris: Secondary | ICD-10-CM

## 2021-08-04 DIAGNOSIS — I272 Pulmonary hypertension, unspecified: Secondary | ICD-10-CM

## 2021-08-04 DIAGNOSIS — I10 Essential (primary) hypertension: Secondary | ICD-10-CM | POA: Diagnosis not present

## 2021-08-06 ENCOUNTER — Telehealth (HOSPITAL_BASED_OUTPATIENT_CLINIC_OR_DEPARTMENT_OTHER): Payer: Self-pay | Admitting: Family

## 2021-08-06 NOTE — Telephone Encounter (Signed)
Left message for patient regarding the Thursday 08/27/21 2:30 pm appointment with Dr. Thora Lance at [facility]---[address], [address]----arrival time is 2:15 pm for check in---will mail information to patient and it is available in My Chart.

## 2021-08-21 ENCOUNTER — Telehealth: Payer: Self-pay | Admitting: Pharmacist

## 2021-08-21 NOTE — Progress Notes (Signed)
Chronic Care Management Pharmacy Assistant    Name: James Jones  MRN: [id number] DOB: Jun 17, 1945   Reason for Encounter: Disease State - Diabetes Call    Recent office visits:  None noted.   Recent consult visits:  08/04/21 Gillian Shields, NP - Pulmonary Hypertension - Referral to Pulmonology - No medication changes noted.  Follow up in 3-4 months.   Hospital visits:  None in previous 6 months   Medications: Outpatient Encounter Medications as of 08/21/2021  Medication Sig   allopurinol (ZYLOPRIM) 100 MG tablet TAKE 1 TABLET BY MOUTH EVERY DAY   amLODipine (NORVASC) 10 MG tablet Take 1 tablet (10 mg total) by mouth daily.   Blood Glucose Monitoring Suppl (FREESTYLE LITE) DEVI Use to check blood sugar once daily   carvedilol (COREG) 25 MG tablet Take 1 tablet (25 mg total) by mouth 2 (two) times daily.   Cholecalciferol (VITAMIN D-3) 1000 UNITS CAPS Take by mouth daily. 2 capsules.   dapagliflozin propanediol (FARXIGA) 10 MG TABS tablet Take 1 tablet (10 mg total) by mouth daily before breakfast.   doxazosin (CARDURA) 2 MG tablet Take 1 tablet (2 mg total) by mouth 2 (two) times daily. TO BE TAKEN WITH 4 MG TABLET FOR TOTAL OF 6 MG  TWICE A DAY   doxazosin (CARDURA) 4 MG tablet Take 1 tablet (4 mg total) by mouth 2 (two) times daily.   ferrous sulfate 325 (65 FE) MG tablet Take 1 tablet (325 mg total) by mouth 2 (two) times daily with a meal.   fish oil-omega-3 fatty acids 1000 MG capsule Take 1,200 mg by mouth daily.    glipiZIDE (GLUCOTROL XL) 2.5 MG 24 hr tablet Take 1 tablet (2.5 mg total) by mouth daily with breakfast.   glucose blood (FREESTYLE LITE) test strip USE TO CHECK BLOOD SUGAR 1-2x DAILY. E11.59   Multiple Vitamin (MULTIVITAMIN WITH MINERALS) TABS Take 1 tablet by mouth daily.   omeprazole (PRILOSEC) 20 MG capsule Take 1 capsule (20 mg total) by mouth daily.   Potassium 99 MG TABS Take 99 mg by mouth daily.   PRESCRIPTION MEDICATION Uses C-PAP at bedtime    rosuvastatin (CRESTOR) 40 MG tablet Take 1 tablet (40 mg total) by mouth daily.   valsartan (DIOVAN) 320 MG tablet TAKE 1 TABLET BY MOUTH EVERY DAY   No facility-administered encounter medications on file as of 08/21/2021.    Current antihyperglycemic regimen:  Glipizide XL 2.5mg  daily Farxiga 10mg  daily   What recent interventions/DTPs have been made to improve glycemic control:  Patient denied any recent changes in medication regimen since last visit with CPP.  Have there been any recent hospitalizations or ED visits since last visit with CPP?  Patient has not had any hospitalizations or ED visits since last visit since CPP.  Patient denies hypoglycemic symptoms, including Sweaty and Shaky   Patient denies hyperglycemic symptoms, including blurry vision, excessive thirst, fatigue, polyuria, and weakness   How often are you checking your blood sugar? Patient reported checking blood sugars regularly   What are your blood sugars ranging?  Fasting: 110-120 Before meals:  After meals:  Bedtime:   During the week, how often does your blood glucose drop below 70?  Patient denied any readings 70 or below.  Are you checking your feet daily/regularly? Patient reported checking feet regularly and denied any current concerns.    Adherence Review: Is the patient currently on a STATIN medication? Yes Is the patient currently on ACE/ARB medication? Yes Does the  patient have >5 day gap between last estimated fill dates? No   Care Gaps   AWV: done 04/23/21 Colonoscopy: unknown  DM Eye Exam: due 04/28/21 DM Foot Exam:  due 04/15/21 Microalbumin: done 04/11/20 HbgAIC: done 04/30/21 (6.7) DEXA: N/A Mammogram: N/A     Star Rating Drugs: Valsartan 320 mg - last filled 07/26/21 90 days  Dapagliflozin 10 mg - last filled 07/07/21 90 days  Rosuvastatin 20 mg - last filled 08/05/21 90 days    Future Appointments  Date Time Provider Department Center  08/27/2021  2:30 PM Omar Person, MD [facility] None  09/08/2021  9:00 AM Carlus Pavlov, MD [facility] None  11/03/2021 10:00 AM Chilton Si, MD [facility]  04/29/2022  9:00 AM [facility]-NURSE HEALTH ADVISOR [facility]     Eugenie Filler, [facility] Clinical Pharmacist Assistant  [phone]

## 2021-08-26 NOTE — Progress Notes (Signed)
Synopsis: Referred for abnormal PFTs by Loel Dubonnet, NP  Subjective:   PATIENT ID: James Jones GENDER: male DOB: September 02, 1945, MRN: [id number]  Chief Complaint  Patient presents with   Pulmonary Consult    Referred by Dr. Oval Linsey. Pt c/o DOE for the past year. He gets winded walking stairs or any incline. He had abnormal PFT May 2023.    75yM with history of AS, CKD, GERD, OSA on ?VSIMV with PEEP 5 and PS 10 Vt 650 RR 12 - sees Dr. Claiborne Billings for this, HTN, [place]  He's never known about having asthma. He did get covid a couple years ago - mild and wasn't hospitalized. His DOE has gradually worsened over the past year. He doesn't have any orthopnea. He has no cough.   He has never tried any inhalers. He has never had a course of prednisone for trouble breathing.    Otherwise pertinent review of systems is negative.  No family history of lung disease  He smoked 50 years 2ppd. Quit 2022. 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 Still smokes weed - joints, has tried edibles but not same effect.   Past Medical History:  Diagnosis Date   Aortic stenosis 04/02/2021   CKD (chronic kidney disease) stage 2, GFR 60-89 ml/min 03/17/2012   Coronary artery disease, non-occlusive 03/18/2012   60-70% stenosis in RCA -- FFR 0.84; EF 60-65%   Diabetes mellitus without complication (HCC)    GERD (gastroesophageal reflux disease)    On PPI   History of GI bleed January 2015   No obvious findings on EGD/colonoscopy   Hydrocele, bilateral    large on [place] 2020   Hyperlipidemia    Hypertension    Obesity (BMI 30.0-34.9) 10/02/2012   OSA (obstructive sleep apnea) 03/18/2012   Doing better with CPAP   Pulmonary hypertension ([place]) 12/10/2010   ECHO:  Mild PH,mild LVH; PA pressures estimated 30-40 mmHg   Pulmonary hypertension, unspecified ([place]) 06/04/2021   RBBB, 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   Event monitor; usually during sleeping hours; therefore not on beta blocker      Family History  Problem Relation Age of Onset   Hypertension Mother    Kidney failure Mother    Coronary artery disease Father    Hypertension Sister    Kidney failure Sister    Stroke Maternal Grandmother      Past Surgical History:  Procedure Laterality Date   APPENDECTOMY  1974   CARPAL TUNNEL RELEASE     EYE SURGERY  1960   Left eye   HEMORRHOID SURGERY     LEFT HEART CATH AND CORONARY ANGIOGRAPHY  03/27/2010   Moderate mid RCA lesion,right radial approach,normal EF   LEFT HEART CATHETERIZATION WITH CORONARY ANGIOGRAM N/A 03/17/2012   Procedure: LEFT HEART CATHETERIZATION WITH CORONARY ANGIOGRAM;  Surgeon: Leonie Man, MD;  Location: [facility] CATH LAB;  Service: Cardiovascular: Fractional Flow Reserve Measurement of mid RCA 60-70% stenosis = 0.84. Otherwise mild LCA CAD.  Normal EF & EDP   SHOULDER ARTHROSCOPY  2006   TRANSTHORACIC ECHOCARDIOGRAM  12/2010    Mild concentric LVH.  EF> 55%.  GR 1 DD.  Mild aortic sclerosis no stenosis.  PA pressures estimated 30-40 mmHg    Social History   Socioeconomic History   Marital status: Married    Spouse name: Not on file   Number of children: Not on file   Years of education: Not on file   Highest education level:  Not on file  Occupational History   Not on file  Tobacco Use   Smoking status: Former    Packs/day: 2.00    Years: 50.00    Total pack years: 100.00    Types: Cigarettes    Quit date: 10/09/2020    Years since quitting: 0.8   Smokeless tobacco: Never  Vaping Use   Vaping Use: Never used  Substance and Sexual Activity   Alcohol use: Yes    Alcohol/week: 1.0 standard drink of alcohol    Types: 1 Standard drinks or equivalent per week   Drug use: Yes    Types: Marijuana    Comment: cannibus, occ use   Sexual activity: Not on file  Other Topics Concern   Not on file  Social History Narrative   Married, father of 2, grandfather of 3.   He is a former smoker of about a pack to pack and half cigarettes a day --  he quit in March of this year.    He is an avid exerciser working at least 4-5 days a week doing her walking or stationary bike.   Social Determinants of Health   Financial Resource Strain: Medium Risk (04/23/2021)   Overall Financial Resource Strain (CARDIA)    Difficulty of Paying Living Expenses: Somewhat hard  Food Insecurity: No Food Insecurity (04/23/2021)   Hunger Vital Sign    Worried About Running Out of Food in the Last Year: Never true    Ran Out of Food in the Last Year: Never true  Transportation Needs: No Transportation Needs (04/23/2021)   PRAPARE - Hydrologist (Medical): No    Lack of Transportation (Non-Medical): No  Physical Activity: Sufficiently Active (04/23/2021)   Exercise Vital Sign    Days of Exercise per Week: 3 days    Minutes of Exercise per Session: 60 min  Stress: No Stress Concern Present (04/23/2021)   Gans    Feeling of Stress : Only a little  Social Connections: Socially Integrated (04/23/2021)   Social Connection and Isolation Panel [NHANES]    Frequency of Communication with Friends and Family: More than three times a week    Frequency of Social Gatherings with Friends and Family: More than three times a week    Attends Religious Services: More than 4 times per year    Active Member of [employer] or Organizations: Yes    Attends Music therapist: More than 4 times per year    Marital Status: Married  Human resources officer Violence: Not At Risk (04/23/2021)   Humiliation, Afraid, Rape, and Kick questionnaire    Fear of Current or Ex-Partner: No    Emotionally Abused: No    Physically Abused: No    Sexually Abused: No     Allergies  Allergen Reactions   Atorvastatin     myalgias     Outpatient Medications Prior to Visit  Medication Sig Dispense Refill   allopurinol (ZYLOPRIM) 100 MG tablet TAKE 1 TABLET BY MOUTH EVERY DAY 90 tablet 3    amLODipine (NORVASC) 10 MG tablet Take 1 tablet (10 mg total) by mouth daily. 90 tablet 3   Blood Glucose Monitoring Suppl (FREESTYLE LITE) DEVI Use to check blood sugar once daily 1 each 0   carvedilol (COREG) 25 MG tablet Take 1 tablet (25 mg total) by mouth 2 (two) times daily. 180 tablet 3   Cholecalciferol (VITAMIN D-3) 1000 UNITS CAPS Take by  mouth daily. 2 capsules.     dapagliflozin propanediol (FARXIGA) 10 MG TABS tablet Take 1 tablet (10 mg total) by mouth daily before breakfast. 90 tablet 3   doxazosin (CARDURA) 2 MG tablet Take 1 tablet (2 mg total) by mouth 2 (two) times daily. TO BE TAKEN WITH 4 MG TABLET FOR TOTAL OF 6 MG  TWICE A DAY 180 tablet 3   doxazosin (CARDURA) 4 MG tablet Take 1 tablet (4 mg total) by mouth 2 (two) times daily. 180 tablet 3   ferrous sulfate 325 (65 FE) MG tablet Take 1 tablet (325 mg total) by mouth 2 (two) times daily with a meal. 180 tablet 3   fish oil-omega-3 fatty acids 1000 MG capsule Take 1,200 mg by mouth daily.      glipiZIDE (GLUCOTROL XL) 2.5 MG 24 hr tablet Take 1 tablet (2.5 mg total) by mouth daily with breakfast. 90 tablet 3   glucose blood (FREESTYLE LITE) test strip USE TO CHECK BLOOD SUGAR 1-2x DAILY. E11.59 200 strip 12   Multiple Vitamin (MULTIVITAMIN WITH MINERALS) TABS Take 1 tablet by mouth daily.     omeprazole (PRILOSEC) 20 MG capsule Take 1 capsule (20 mg total) by mouth daily. 90 capsule 3   PRESCRIPTION MEDICATION Uses C-PAP at bedtime     rosuvastatin (CRESTOR) 40 MG tablet Take 1 tablet (40 mg total) by mouth daily. 90 tablet 3   valsartan (DIOVAN) 320 MG tablet TAKE 1 TABLET BY MOUTH EVERY DAY 90 tablet 1   Potassium 99 MG TABS Take 99 mg by mouth daily.     No facility-administered medications prior to visit.       Objective:   Physical Exam:  General appearance: 76 y.o., male, NAD, conversant  Eyes: anicteric sclerae; PERRL, tracking appropriately HENT: NCAT; MMM Neck: Trachea midline; no lymphadenopathy, no  JVD Lungs: CTAB, no crackles, no wheeze, with normal respiratory effort CV: RRR, no murmur  Abdomen: Soft, non-tender; non-distended, BS present  Extremities: No peripheral edema, warm Skin: Normal turgor and texture; no rash Psych: Appropriate affect Neuro: Alert and oriented to person and place, no focal deficit     Vitals:   08/27/21 1434  BP: 112/60  Pulse: (!) 59  Temp: 98 F (36.7 C)  TempSrc: Oral  SpO2: 98%  Weight: 207 lb (93.9 kg)  Height: 5\' 7"  (1.702 m)   98% on RA BMI Readings from Last 3 Encounters:  08/27/21 32.42 kg/m  08/04/21 32.08 kg/m  06/04/21 32.30 kg/m   Wt Readings from Last 3 Encounters:  08/27/21 207 lb (93.9 kg)  08/04/21 211 lb (95.7 kg)  06/04/21 212 lb 6.4 oz (96.3 kg)     CBC    Component Value Date/Time   WBC 6.8 10/16/2020 0916   RBC 4.87 10/16/2020 0916   HGB 13.4 10/16/2020 0916   HCT 41.7 10/16/2020 0916   PLT 220 10/16/2020 0916   MCV 85.6 10/16/2020 0916   MCH 27.5 10/16/2020 0916   MCHC 32.1 10/16/2020 0916   RDW 14.8 10/16/2020 0916   LYMPHSABS 1,190 10/16/2020 0916   MONOABS 434 04/08/2016 0828   EOSABS 150 10/16/2020 0916   BASOSABS 27 10/16/2020 0916    Eos 90-150  Chest Imaging: CT Chest 06/17/21 reviewed by me with bronchial wall thickening, a few foci of scar in lower lobes, possible emphysema  Pulmonary Functions Testing Results:    Latest Ref Rng & Units 06/16/2021   10:46 AM  PFT Results  FVC-Pre L 2.72  FVC-Predicted Pre % 79   FVC-Post L 2.89   FVC-Predicted Post % 84   Pre FEV1/FVC % % 72   Post FEV1/FCV % % 68   FEV1-Pre L 1.95   FEV1-Predicted Pre % 78   FEV1-Post L 1.97   DLCO uncorrected ml/min/mmHg 14.28   DLCO UNC% % 61   DLVA Predicted % 72   TLC L 6.12   TLC % Predicted % 92   RV % Predicted % 123    PFT 06/16/21 reviewed by me with mild obstruction, air trapping, mildly reduced diffusing capacity, no BD response   Echocardiogram:   TTE 05/26/21:  1. Left ventricular ejection  fraction, by estimation, is 60 to 65%. Left  ventricular ejection fraction by 3D volume is 61 %. The left ventricle has  normal function. The left ventricle has no regional wall motion  abnormalities. There is mild left  ventricular hypertrophy. Left ventricular diastolic parameters are  consistent with Grade II diastolic dysfunction (pseudonormalization).  Elevated left atrial pressure.   2. Right ventricular systolic function is normal. The right ventricular  size is mildly enlarged. There is severely elevated pulmonary artery  systolic pressure. The estimated right ventricular systolic pressure is  60.4 mmHg.   3. Left atrial size was mildly dilated.   4. Right atrial size was mildly dilated.   5. The mitral valve is normal in structure. Trivial mitral valve  regurgitation. No evidence of mitral stenosis.   6. The inferior vena cava is dilated in size with <50% respiratory  variability, suggesting right atrial pressure of 15 mmHg.   7. The aortic valve is calcified. There is moderate calcification of the  aortic valve. Aortic valve regurgitation is not visualized. Mild aortic  valve stenosis. Vmax 2.2 m/s, MG , AVA 1.9 cm^2, DI 0.47       Assessment & Plan:   # DOE # COPD gold functional group B His DOE may be multifactorial however he does appear to have untreated COPD.   # Smoking  # ?Complex sleep-disordered breathing: Appears to be on v-SIMV with PEEP 5 and PS 10 Vt 650 RR 12. Tolerating well, not excessively sleepy, excellent adherence. Followed by Dr. Tresa Endo.  Plan: - trial anoro 1 puff once daily, will send message to pharmacy to see which LABA/LAMA least expensive for him - albuterol prn - qualifies for lung cancer screening, sounds interested, order referral next visit   RTC 3 months      Omar Person, MD [facility] 08/27/2021 2:47 PM

## 2021-08-27 ENCOUNTER — Encounter: Payer: Self-pay | Admitting: Student

## 2021-08-27 ENCOUNTER — Ambulatory Visit (INDEPENDENT_AMBULATORY_CARE_PROVIDER_SITE_OTHER): Payer: Medicare Other | Admitting: Student

## 2021-08-27 VITALS — BP 112/60 | HR 59 | Temp 98.0°F | Ht 67.0 in | Wt 207.0 lb

## 2021-08-27 DIAGNOSIS — R0609 Other forms of dyspnea: Secondary | ICD-10-CM

## 2021-08-27 DIAGNOSIS — F17200 Nicotine dependence, unspecified, uncomplicated: Secondary | ICD-10-CM

## 2021-08-27 DIAGNOSIS — J449 Chronic obstructive pulmonary disease, unspecified: Secondary | ICD-10-CM | POA: Diagnosis not present

## 2021-08-27 MED ORDER — ANORO ELLIPTA 62.5-25 MCG/ACT IN AEPB
1.0000 | INHALATION_SPRAY | Freq: Every day | RESPIRATORY_TRACT | 0 refills | Status: DC
Start: 2021-08-27 — End: 2023-09-08

## 2021-08-27 MED ORDER — ALBUTEROL SULFATE HFA 108 (90 BASE) MCG/ACT IN AERS: 2.0000 | INHALATION_SPRAY | Freq: Four times a day (QID) | RESPIRATORY_TRACT | 6 refills | Status: DC | PRN

## 2021-08-27 NOTE — Patient Instructions (Addendum)
-   START anoro 1 puff once daily every day - albuterol 1-2 puffs ONLY as needed - this is a rescue inhaler - see you in 3 months, I'll place referral for lung cancer screening at that visit

## 2021-09-08 ENCOUNTER — Other Ambulatory Visit: Payer: Self-pay | Admitting: Internal Medicine

## 2021-09-08 ENCOUNTER — Ambulatory Visit (INDEPENDENT_AMBULATORY_CARE_PROVIDER_SITE_OTHER): Payer: Medicare Other | Admitting: Internal Medicine

## 2021-09-08 ENCOUNTER — Encounter: Payer: Self-pay | Admitting: Internal Medicine

## 2021-09-08 VITALS — BP 162/88 | HR 57 | Ht 67.0 in | Wt 208.2 lb

## 2021-09-08 DIAGNOSIS — Z6830 Body mass index (BMI) 30.0-30.9, adult: Secondary | ICD-10-CM

## 2021-09-08 DIAGNOSIS — E1159 Type 2 diabetes mellitus with other circulatory complications: Secondary | ICD-10-CM | POA: Diagnosis not present

## 2021-09-08 DIAGNOSIS — I10 Essential (primary) hypertension: Secondary | ICD-10-CM

## 2021-09-08 DIAGNOSIS — E785 Hyperlipidemia, unspecified: Secondary | ICD-10-CM | POA: Diagnosis not present

## 2021-09-08 DIAGNOSIS — I152 Hypertension secondary to endocrine disorders: Secondary | ICD-10-CM

## 2021-09-08 DIAGNOSIS — E669 Obesity, unspecified: Secondary | ICD-10-CM

## 2021-09-08 DIAGNOSIS — I2510 Atherosclerotic heart disease of native coronary artery without angina pectoris: Secondary | ICD-10-CM | POA: Diagnosis not present

## 2021-09-08 LAB — POCT GLYCOSYLATED HEMOGLOBIN (HGB A1C): Hemoglobin A1C: 6.5 % — AB (ref 4.0–5.6)

## 2021-09-08 MED ORDER — FREESTYLE LITE TEST VI STRP: ORAL_STRIP | 12 refills | Status: DC

## 2021-09-08 NOTE — Progress Notes (Signed)
Patient ID: James Jones, male   DOB: 1946-01-28, 76 y.o.   MRN: [id number]  HPI: James Jones is a 76 y.o.-year-old male, returning for f/u for DM2, dx in ~2005, non-insulin-dependent, lately more controlled, with complications (CAD, CKD). Last visit 6 months ago. PCP: Dr. Jenna Luo  Interim history: No blurry vision, nausea, chest pain. No steroid inj's since last OV. No gout attacks since last visit.  He saw pulmonology recently and was diagnosed with COPD.  He has a history of smoking.  Reviewed HbA1c levels: Lab Results  Component Value Date   HGBA1C 6.7 (H) 04/30/2021   HGBA1C 6.6 (A) 03/17/2021   HGBA1C 6.6 (A) 10/07/2020  04/11/2014: HbA1c 8.4% 01/08/2014: HbA1c 7.7% He had steroid inj for gout in his elbows - 12/2013. He also had a steroid inj in knee fall 2016, too.   He is on: - Glipizide ER 2.5 mg daily in a.m. - Farxiga 10 mg daily - started by PCP - started 1 mo ago - very expensive (600$ for 3 mo >> now cheaper) We had to stop Januvia since last visit due to price.  Tradjenta was also not covered. Previously on low-dose Metformin ER, but stopped 07/2018 due to CKD Had nausea, vomiting, loss of appetite with regular metformin.  He was on Actos. He was on insulin before (Lantus) - came off years ago.  Pt checks his sugars 1-2 times a day,: - am: 98-118, 123 >> 97-131, 136 (most 100-120) >> 91-122, 134 (pasta) - 2h after b'fast: 119, 160 >> n/c >> 119, 133 >> n/c >> 118 >> n/c - before lunch: 119-142 >> 122, 140 > 109-148 >> 133 >> 154 >> 94 - 2h after lunch: n/c >> 110 >> 124-179 >> 99 >> 65, 78-120 - before dinner:  110-148 >> 87 >> n/c >> 91, 151 >> 91 >> 82 >> n/c - 2h after dinner: n/c >> 107, 164 >> 118, 128 >> 112, 131 >> n/c - bedtime: n/c >> 109-142, 187 >> 98-139 >> 104 >> 103-150 - nighttimen/c >> 100 >> n/c  >> 122 >> n/c Lowest 54 x1 >>... 91 >> 82>> 65; he has hypoglycemia awareness in the 70s. Highest sugar was 285 (Prednisone) >> .Marland Kitchen. 179 >> 154  >> 150  Glucometer: Freestyle [employer] did not cover Freestyle Libre CGM.  Pt's meals are: - Breakfast: bacon + eggs, sausage, grits, oatmeal, cereal, toast wheat - Lunch: BLT, soups, fruit, nuts - Dinner: chicken, pork + greens, rice  - Snacks: 1 a day: pretzels; carrots  Previously exercising by walking several times a week, but stopped exercising due to the coronavirus pandemic. Walking 6 mi MWF at the [facility].  -+ CKD-sees nephrology (renal ultrasound showed renal cysts), latest BUN/creatinine:  Lab Results  Component Value Date   BUN 22 04/30/2021   CREATININE 1.82 (H) 04/30/2021  05/24/2014: 34/1.64 01/08/2014: 18/1.15 On valsartan.  -+ HL; last set of lipids: Lab Results  Component Value Date   CHOL 140 12/25/2020   HDL 49 12/25/2020   LDLCALC 67 12/25/2020   TRIG 138 12/25/2020   CHOLHDL 2.9 12/25/2020  On rosuvastatin 40, fish oil.  He still has some muscle aches, improved.  He had more muscle aches when he was on pravastatin 40 before.  - last eye exam was in 04/2021: No DR.  He has history of cataract surgery in 2016 and 17.  - no numbness and tingling in his feet. + Occasional pain in hands and feet.  Latest TSH was normal:  Lab Results  Component Value Date   TSH 1.740 10/02/2020   ROS: + see HPI  I reviewed pt's medications, allergies, PMH, social hx, family hx, and changes were documented in the history of present illness. Otherwise, unchanged from my initial visit note.  Past Medical History:  Diagnosis Date   Aortic stenosis 04/02/2021   CKD (chronic kidney disease) stage 2, GFR 60-89 ml/min 03/17/2012   Coronary artery disease, non-occlusive 03/18/2012   60-70% stenosis in RCA -- FFR 0.84; EF 60-65%   Diabetes mellitus without complication (HCC)    GERD (gastroesophageal reflux disease)    On PPI   History of GI bleed January 2015   No obvious findings on EGD/colonoscopy   Hydrocele, bilateral    large on [place] 2020   Hyperlipidemia    Hypertension     Obesity (BMI 30.0-34.9) 10/02/2012   OSA (obstructive sleep apnea) 03/18/2012   Doing better with CPAP   Pulmonary hypertension ([place]) 12/10/2010   ECHO:  Mild PH,mild LVH; PA pressures estimated 30-40 mmHg   Pulmonary hypertension, unspecified ([place]) 06/04/2021   RBBB, 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   Event monitor; usually during sleeping hours; therefore not on beta blocker  + Gout  Past Surgical History:  Procedure Laterality Date   [place]   Left eye   HEMORRHOID SURGERY     LEFT HEART CATH AND CORONARY ANGIOGRAPHY  03/27/2010   Moderate mid RCA lesion,right radial approach,normal EF   LEFT HEART CATHETERIZATION WITH CORONARY ANGIOGRAM N/A 03/17/2012   Procedure: LEFT HEART CATHETERIZATION WITH CORONARY ANGIOGRAM;  Surgeon: Leonie Man, MD;  Location: [facility] CATH LAB;  Service: Cardiovascular: Fractional Flow Reserve Measurement of mid RCA 60-70% stenosis = 0.84. Otherwise mild LCA CAD.  Normal EF & EDP   SHOULDER ARTHROSCOPY  2006   TRANSTHORACIC ECHOCARDIOGRAM  12/2010    Mild concentric LVH.  EF> 55%.  GR 1 DD.  Mild aortic sclerosis no stenosis.  PA pressures estimated 30-40 mmHg   History   Social History   Marital Status: Married    Spouse Name: N/A   Occupational History   retired   Social History Main Topics   Smoking status: Former Smoker -- 2.00 packs/day    Quit date: 04/12/2012   Smokeless tobacco: Never Used   Alcohol Use: 0.5 oz/week    1 drink(s) per week   Drug Use: Yes    Special: Marijuana     Comment: cannibus   Social History Narrative   Married, father of 2, grandfather of 3.   He is a former smoker of about a pack to pack and half cigarettes a day -- he quit in March of this year.    He is an avid exerciser working at least 4-5 days a week doing her walking or stationary bike.   Current Outpatient Medications on File Prior to Visit  Medication Sig Dispense Refill    albuterol (VENTOLIN HFA) 108 (90 Base) MCG/ACT inhaler Inhale 2 puffs into the lungs every 6 (six) hours as needed for wheezing or shortness of breath. 8 g 6   allopurinol (ZYLOPRIM) 100 MG tablet TAKE 1 TABLET BY MOUTH EVERY DAY 90 tablet 3   amLODipine (NORVASC) 10 MG tablet Take 1 tablet (10 mg total) by mouth daily. 90 tablet 3   Blood Glucose Monitoring Suppl (FREESTYLE LITE) DEVI Use to check blood sugar once daily 1 each 0  carvedilol (COREG) 25 MG tablet Take 1 tablet (25 mg total) by mouth 2 (two) times daily. 180 tablet 3   Cholecalciferol (VITAMIN D-3) 1000 UNITS CAPS Take by mouth daily. 2 capsules.     dapagliflozin propanediol (FARXIGA) 10 MG TABS tablet Take 1 tablet (10 mg total) by mouth daily before breakfast. 90 tablet 3   doxazosin (CARDURA) 2 MG tablet Take 1 tablet (2 mg total) by mouth 2 (two) times daily. TO BE TAKEN WITH 4 MG TABLET FOR TOTAL OF 6 MG  TWICE A DAY 180 tablet 3   doxazosin (CARDURA) 4 MG tablet Take 1 tablet (4 mg total) by mouth 2 (two) times daily. 180 tablet 3   ferrous sulfate 325 (65 FE) MG tablet Take 1 tablet (325 mg total) by mouth 2 (two) times daily with a meal. 180 tablet 3   fish oil-omega-3 fatty acids 1000 MG capsule Take 1,200 mg by mouth daily.      glipiZIDE (GLUCOTROL XL) 2.5 MG 24 hr tablet Take 1 tablet (2.5 mg total) by mouth daily with breakfast. 90 tablet 3   glucose blood (FREESTYLE LITE) test strip USE TO CHECK BLOOD SUGAR 1-2x DAILY. E11.59 200 strip 12   Multiple Vitamin (MULTIVITAMIN WITH MINERALS) TABS Take 1 tablet by mouth daily.     omeprazole (PRILOSEC) 20 MG capsule Take 1 capsule (20 mg total) by mouth daily. 90 capsule 3   PRESCRIPTION MEDICATION Uses C-PAP at bedtime     rosuvastatin (CRESTOR) 40 MG tablet Take 1 tablet (40 mg total) by mouth daily. 90 tablet 3   umeclidinium-vilanterol (ANORO ELLIPTA) 62.5-25 MCG/ACT AEPB Inhale 1 puff into the lungs daily. 7 each 0   valsartan (DIOVAN) 320 MG tablet TAKE 1 TABLET BY  MOUTH EVERY DAY 90 tablet 1   No current facility-administered medications on file prior to visit.   Allergies  Allergen Reactions   Atorvastatin     myalgias   Family History  Problem Relation Age of Onset   Hypertension Mother    Kidney failure Mother    Coronary artery disease Father    Hypertension Sister    Kidney failure Sister    Stroke Maternal Grandmother    PE: BP (!) 162/88 (BP Location: Left Arm, Patient Position: Sitting, Cuff Size: Normal)   Pulse (!) 57   Ht 5\' 7"  (1.702 m)   Wt 208 lb 3.2 oz (94.4 kg)   SpO2 97%   BMI 32.61 kg/m   Wt Readings from Last 3 Encounters:  09/08/21 208 lb 3.2 oz (94.4 kg)  08/27/21 207 lb (93.9 kg)  08/04/21 211 lb (95.7 kg)   Constitutional: overweight, in NAD Eyes: EOMI, no exophthalmos ENT: moist mucous membranes, no thyromegaly, no cervical lymphadenopathy Cardiovascular: RRR, No RG, + 2/6 SEM Respiratory: CTA B Musculoskeletal: no deformities Skin: moist, warm, no rashes Neurological: no tremor with outstretched hands Diabetic Foot Exam - Simple   Simple Foot Form Visual Inspection No deformities, no ulcerations, no other skin breakdown bilaterally: Yes Sensation Testing Intact to touch and monofilament testing bilaterally: Yes Pulse Check Posterior Tibialis and Dorsalis pulse intact bilaterally: Yes Comments    ASSESSMENT: 1. DM2, non-insulin-dependent, uncontrolled, with complications - CAD - cardiologist Dr. 08/06/21 - CKD - was seeing Dr Herbie Baltimore >> changed  2. HL  3.  Obesity class I  4. HTN  PLAN:  1. Patient with longstanding, previously uncontrolled type 2 diabetes, with much improved control in the last several years.  Currently on sulfonylurea low-dose  and SGLT2 inhibitor.  He was on a DPP 4 inhibitor in the past.  We cannot use metformin for him due to his CKD.   [name] was previously very expensive but afterwards, it became affordable. -At last visit, sugars remain very well controlled, with  only an occasional slightly hyperglycemic value in the morning (in the 130s, but usually after coffee), and without lows.  We did not change his regimen. -At today's visit, sugars remain at goal in the morning and late during the day, but he did have slightly low blood sugars in the middle of the day, after lunch.  Therefore, at today's visit, I advised him to stop glipizide.  We will continue with Farxiga, which she tolerates well.  I advised him to stay well-hydrated while on this. - I suggested to:  Patient Instructions  Please stop: - Glipizide ER  Continue: - Farxiga 10 mg before b'fast.  Please return in 6 months with your sugar log.   - we checked his HbA1c: 6.5% (lower) - advised to check sugars at different times of the day - 1x a day, rotating check times - advised for yearly eye exams >> he is UTD - return to clinic in 6 months  2. HL -Reviewed latest lipid panel from 12/2020: LDL above our target of less than 55 due to his cardiovascular disease, otherwise lipids at goal: Lab Results  Component Value Date   CHOL 140 12/25/2020   HDL 49 12/25/2020   LDLCALC 67 12/25/2020   TRIG 138 12/25/2020   CHOLHDL 2.9 12/25/2020  -He had myalgias on atorvastatin and also on rosuvastatin, but he is tolerating Crestor 40 mg daily with good tolerance -We discussed in the past about cutting down on eggs.  He was eating 2 every day.  3. Obesity class I -He continues on SGLT2 inhibitor, which should also help with weight loss -We discussed in the past about cutting down on fatty foods -He lost 7 pounds before the last 2 visits combined - lost 2 more lbs since last OV  4. HTN -Blood pressure is quite high today, at 162/88.  He mentions that he frequently has this blood pressure in the morning, sometimes even higher.  He takes the following regimen (but did not take his blood pressure medicines yet this morning): Amlodipine at night Diovan at night Doxazosin twice a day Coreg twice a  day He is taking these correctly.  I advised him to discuss with the treating physician, as he may need intensification of treatment.  Carlus Pavlov, MD PhD [facility]

## 2021-09-08 NOTE — Patient Instructions (Signed)
Please stop: - Glipizide ER  Continue: - Farxiga 10 mg before b'fast.  Please return in 6 months with your sugar log.

## 2021-09-10 ENCOUNTER — Other Ambulatory Visit: Payer: Self-pay

## 2021-09-10 MED ORDER — CARVEDILOL 25 MG PO TABS: 25.0000 mg | ORAL_TABLET | Freq: Two times a day (BID) | ORAL | 1 refills | Status: DC

## 2021-09-10 NOTE — Telephone Encounter (Signed)
Requested Prescriptions  Pending Prescriptions Disp Refills  . carvedilol (COREG) 25 MG tablet 180 tablet 3    Sig: Take 1 tablet (25 mg total) by mouth 2 (two) times daily.     Cardiovascular: Beta Blockers 3 Failed - 09/10/2021 12:05 PM      Failed - Cr in normal range and within 360 days    Creat  Date Value Ref Range Status  04/30/2021 1.82 (H) 0.70 - 1.28 mg/dL Final   Creatinine, Urine  Date Value Ref Range Status  04/11/2020 53 20 - 320 mg/dL Final         Failed - Last BP in normal range    BP Readings from Last 1 Encounters:  09/08/21 (!) 162/88         Passed - AST in normal range and within 360 days    AST  Date Value Ref Range Status  04/30/2021 18 10 - 35 U/L Final         Passed - ALT in normal range and within 360 days    ALT  Date Value Ref Range Status  04/30/2021 13 9 - 46 U/L Final         Passed - Last Heart Rate in normal range    Pulse Readings from Last 1 Encounters:  09/08/21 (!) 57         Passed - Valid encounter within last 6 months    Recent Outpatient Visits          4 months ago Benign essential HTN   [facility] Tanya Nones, Priscille Heidelberg, MD   10 months ago Benign essential HTN   [facility] Pickard, Priscille Heidelberg, MD   1 year ago Urinary frequency   [facility] Pickard, Priscille Heidelberg, MD   1 year ago General medical exam   [facility] Donita Brooks, MD   1 year ago Left sided sciatica   [facility] Pickard, Priscille Heidelberg, MD      Future Appointments            In 1 month Chilton Si, MD [facility]   In 2 months Meier, Ivor Costa, MD [facility]

## 2021-09-10 NOTE — Telephone Encounter (Signed)
Pharmacy faxed a refill request for Medication carvedilol (COREG) 25 MG tablet [[id number]] carvedilol (COREG) 25 MG tablet [[id number]]  ENDED   Order Details Dose: 25 mg Route: Oral Frequency: 2 times daily  Dispense Quantity: 180 tablet Refills: 3   Note to Pharmacy: D/C ATENOLOL       Sig: Take 1 tablet (25 mg total) by mouth 2 (two) times daily.       Start Date: 09/25/20 End Date: 08/27/21 after 180 doses  Written Date: 09/25/20 Expiration Date: 09/25/21

## 2021-10-05 ENCOUNTER — Encounter (HOSPITAL_COMMUNITY): Payer: Self-pay

## 2021-10-05 ENCOUNTER — Other Ambulatory Visit: Payer: Self-pay

## 2021-10-05 ENCOUNTER — Observation Stay (HOSPITAL_COMMUNITY)
Admission: EM | Admit: 2021-10-05 | Discharge: 2021-10-06 | Disposition: A | Discharge status: Home / Self Care | Payer: Medicare Other | Attending: Internal Medicine | Admitting: Internal Medicine

## 2021-10-05 ENCOUNTER — Emergency Department (HOSPITAL_COMMUNITY): Payer: Medicare Other

## 2021-10-05 DIAGNOSIS — R079 Chest pain, unspecified: Secondary | ICD-10-CM | POA: Diagnosis not present

## 2021-10-05 DIAGNOSIS — Z79899 Other long term (current) drug therapy: Secondary | ICD-10-CM | POA: Insufficient documentation

## 2021-10-05 DIAGNOSIS — I2510 Atherosclerotic heart disease of native coronary artery without angina pectoris: Secondary | ICD-10-CM | POA: Diagnosis not present

## 2021-10-05 DIAGNOSIS — N1832 Chronic kidney disease, stage 3b: Secondary | ICD-10-CM | POA: Diagnosis not present

## 2021-10-05 DIAGNOSIS — I130 Hypertensive heart and chronic kidney disease with heart failure and stage 1 through stage 4 chronic kidney disease, or unspecified chronic kidney disease: Secondary | ICD-10-CM | POA: Insufficient documentation

## 2021-10-05 DIAGNOSIS — Z9989 Dependence on other enabling machines and devices: Secondary | ICD-10-CM | POA: Diagnosis not present

## 2021-10-05 DIAGNOSIS — G4733 Obstructive sleep apnea (adult) (pediatric): Secondary | ICD-10-CM

## 2021-10-05 DIAGNOSIS — E1159 Type 2 diabetes mellitus with other circulatory complications: Secondary | ICD-10-CM | POA: Diagnosis not present

## 2021-10-05 DIAGNOSIS — Z87891 Personal history of nicotine dependence: Secondary | ICD-10-CM | POA: Diagnosis not present

## 2021-10-05 DIAGNOSIS — R0789 Other chest pain: Principal | ICD-10-CM

## 2021-10-05 DIAGNOSIS — I10 Essential (primary) hypertension: Secondary | ICD-10-CM

## 2021-10-05 DIAGNOSIS — E785 Hyperlipidemia, unspecified: Secondary | ICD-10-CM | POA: Diagnosis not present

## 2021-10-05 DIAGNOSIS — J9811 Atelectasis: Secondary | ICD-10-CM | POA: Diagnosis not present

## 2021-10-05 DIAGNOSIS — I5032 Chronic diastolic (congestive) heart failure: Secondary | ICD-10-CM | POA: Diagnosis not present

## 2021-10-05 DIAGNOSIS — E119 Type 2 diabetes mellitus without complications: Secondary | ICD-10-CM | POA: Insufficient documentation

## 2021-10-05 DIAGNOSIS — K219 Gastro-esophageal reflux disease without esophagitis: Secondary | ICD-10-CM | POA: Diagnosis not present

## 2021-10-05 DIAGNOSIS — I272 Pulmonary hypertension, unspecified: Secondary | ICD-10-CM | POA: Diagnosis not present

## 2021-10-05 HISTORY — DX: Chronic diastolic (congestive) heart failure: I50.32

## 2021-10-05 LAB — CBC WITH DIFFERENTIAL/PLATELET
Abs Immature Granulocytes: 0.01 10*3/uL (ref 0.00–0.07)
Basophils Absolute: 0 10*3/uL (ref 0.0–0.1)
Basophils Relative: 1 %
Eosinophils Absolute: 0.2 10*3/uL (ref 0.0–0.5)
Eosinophils Relative: 3 %
HCT: 35.5 % — ABNORMAL LOW (ref 39.0–52.0)
Hemoglobin: 11.9 g/dL — ABNORMAL LOW (ref 13.0–17.0)
Immature Granulocytes: 0 %
Lymphocytes Relative: 23 %
Lymphs Abs: 1.3 10*3/uL (ref 0.7–4.0)
MCH: 27.6 pg (ref 26.0–34.0)
MCHC: 33.5 g/dL (ref 30.0–36.0)
MCV: 82.4 fL (ref 80.0–100.0)
Monocytes Absolute: 0.5 10*3/uL (ref 0.1–1.0)
Monocytes Relative: 8 %
Neutro Abs: 3.6 10*3/uL (ref 1.7–7.7)
Neutrophils Relative %: 65 %
Platelets: 188 10*3/uL (ref 150–400)
RBC: 4.31 MIL/uL (ref 4.22–5.81)
RDW: 13.9 % (ref 11.5–15.5)
WBC: 5.5 10*3/uL (ref 4.0–10.5)
nRBC: 0 % (ref 0.0–0.2)

## 2021-10-05 LAB — TROPONIN I (HIGH SENSITIVITY)
Troponin I (High Sensitivity): 13 ng/L (ref <18)
Troponin I (High Sensitivity): 17 ng/L (ref <18)

## 2021-10-05 LAB — BASIC METABOLIC PANEL
Anion gap: 9 (ref 5–15)
BUN: 16 mg/dL (ref 8–23)
CO2: 23 mmol/L (ref 22–32)
Calcium: 9 mg/dL (ref 8.9–10.3)
Chloride: 111 mmol/L (ref 98–111)
Creatinine, Ser: 1.8 mg/dL — ABNORMAL HIGH (ref 0.61–1.24)
GFR, Estimated: 38 mL/min — ABNORMAL LOW (ref >60)
Glucose, Bld: 159 mg/dL — ABNORMAL HIGH (ref 70–99)
Potassium: 3.6 mmol/L (ref 3.5–5.1)
Sodium: 143 mmol/L (ref 135–145)

## 2021-10-05 LAB — CBC
HCT: 37.4 % — ABNORMAL LOW (ref 39.0–52.0)
Hemoglobin: 12.3 g/dL — ABNORMAL LOW (ref 13.0–17.0)
MCH: 27.6 pg (ref 26.0–34.0)
MCHC: 32.9 g/dL (ref 30.0–36.0)
MCV: 83.9 fL (ref 80.0–100.0)
Platelets: 204 10*3/uL (ref 150–400)
RBC: 4.46 MIL/uL (ref 4.22–5.81)
RDW: 14.1 % (ref 11.5–15.5)
WBC: 6.6 10*3/uL (ref 4.0–10.5)
nRBC: 0 % (ref 0.0–0.2)

## 2021-10-05 LAB — MAGNESIUM: Magnesium: 1.7 mg/dL (ref 1.7–2.4)

## 2021-10-05 LAB — BRAIN NATRIURETIC PEPTIDE: B Natriuretic Peptide: 63.7 pg/mL (ref 0.0–100.0)

## 2021-10-05 MED ORDER — NITROGLYCERIN 0.4 MG SL SUBL
0.4000 mg | SUBLINGUAL_TABLET | SUBLINGUAL | Status: DC | PRN
  Administered 2021-10-05: 0.4 mg via SUBLINGUAL
  Filled 2021-10-05: qty 1

## 2021-10-05 MED ORDER — ALUM & MAG HYDROXIDE-SIMETH 200-200-20 MG/5ML PO SUSP
30.0000 mL | Freq: Once | ORAL | Status: AC
  Administered 2021-10-05: 30 mL via ORAL
  Filled 2021-10-05: qty 30

## 2021-10-05 MED ORDER — INSULIN ASPART 100 UNIT/ML IJ SOLN
0.0000 [IU] | Freq: Three times a day (TID) | INTRAMUSCULAR | Status: DC
  Administered 2021-10-06: 2 [IU] via SUBCUTANEOUS

## 2021-10-05 MED ORDER — ROSUVASTATIN CALCIUM 20 MG PO TABS
40.0000 mg | ORAL_TABLET | Freq: Every day | ORAL | Status: DC
  Administered 2021-10-06: 40 mg via ORAL
  Filled 2021-10-05: qty 2

## 2021-10-05 MED ORDER — ACETAMINOPHEN 325 MG PO TABS: 650.0000 mg | ORAL_TABLET | Freq: Four times a day (QID) | ORAL | Status: DC | PRN

## 2021-10-05 MED ORDER — PANTOPRAZOLE SODIUM 40 MG PO TBEC
40.0000 mg | DELAYED_RELEASE_TABLET | Freq: Every day | ORAL | Status: DC
  Administered 2021-10-06: 40 mg via ORAL
  Filled 2021-10-05: qty 1

## 2021-10-05 MED ORDER — LIDOCAINE VISCOUS HCL 2 % MT SOLN
15.0000 mL | Freq: Once | OROMUCOSAL | Status: AC
  Administered 2021-10-05: 15 mL via ORAL
  Filled 2021-10-05: qty 15

## 2021-10-05 MED ORDER — ACETAMINOPHEN 650 MG RE SUPP: 650.0000 mg | Freq: Four times a day (QID) | RECTAL | Status: DC | PRN

## 2021-10-05 MED ORDER — KETOROLAC TROMETHAMINE 15 MG/ML IJ SOLN
15.0000 mg | Freq: Once | INTRAMUSCULAR | Status: AC
  Administered 2021-10-05: 15 mg via INTRAMUSCULAR
  Filled 2021-10-05: qty 1

## 2021-10-05 MED ORDER — DOXAZOSIN MESYLATE 4 MG PO TABS
6.0000 mg | ORAL_TABLET | Freq: Two times a day (BID) | ORAL | Status: DC
  Administered 2021-10-05 – 2021-10-06 (×2): 6 mg via ORAL
  Filled 2021-10-05 (×3): qty 1

## 2021-10-05 MED ORDER — HYDROCODONE-ACETAMINOPHEN 5-325 MG PO TABS
1.0000 | ORAL_TABLET | Freq: Once | ORAL | Status: AC
  Administered 2021-10-05: 1 via ORAL
  Filled 2021-10-05: qty 1

## 2021-10-05 MED ORDER — IRBESARTAN 300 MG PO TABS
300.0000 mg | ORAL_TABLET | Freq: Every day | ORAL | Status: DC
  Administered 2021-10-06: 300 mg via ORAL
  Filled 2021-10-05: qty 1

## 2021-10-05 MED ORDER — ASPIRIN 325 MG PO TABS
325.0000 mg | ORAL_TABLET | Freq: Once | ORAL | Status: AC
  Administered 2021-10-05: 325 mg via ORAL
  Filled 2021-10-05: qty 1

## 2021-10-05 MED ORDER — POTASSIUM CHLORIDE CRYS ER 20 MEQ PO TBCR
40.0000 meq | EXTENDED_RELEASE_TABLET | Freq: Once | ORAL | Status: AC
  Administered 2021-10-05: 40 meq via ORAL
  Filled 2021-10-05: qty 2

## 2021-10-05 NOTE — ED Provider Notes (Signed)
  Provider Note MRN:  [id number]  Arrival date & time: 10/05/21    ED Course and Medical Decision Making  Assumed care from Pickering/ALI- PA at shift change.  See note from prior team for complete details, in brief:  76 yo male Follows with Dr Duke Salvia Left sided chest pain, unrelieved with meds here XR stable, Trop stable, ECG w/o acute ischemia (RBBB noted unch) HEART score is 5 D/w Dr Anne Fu - stress test tomorrow Dr Anne Fu recommends admit to medicine  D/w hospitalist who accepts pt for admission    Procedures  Final Clinical Impressions(s) / ED Diagnoses     ICD-10-CM   1. Moderate risk chest pain  R07.9       ED Discharge Orders     None       Discharge Instructions   None        Sloan Leiter, DO 10/05/21 1942

## 2021-10-05 NOTE — ED Triage Notes (Signed)
Complains of chest pain x 3 days with no other symptioms.  Reports he even went to the gym this morning.

## 2021-10-05 NOTE — ED Notes (Signed)
ED TO INPATIENT HANDOFF REPORT  ED Nurse Name and Phone #:  Morrie Sheldon RN [phone]  S Name/Age/Gender James Jones 76 y.o. male Room/Bed: 012C/012C  Code Status   Code Status: DNR  Home/SNF/Other Home Patient oriented to: self, place, time, and situation Is this baseline? Yes   Triage Complete: Triage complete  Chief Complaint Chest pain [R07.9]  Triage Note Complains of chest pain x 3 days with no other symptioms.  Reports he even went to the gym this morning.    Allergies Allergies  Allergen Reactions   Atorvastatin     myalgias    Level of Care/Admitting Diagnosis ED Disposition     ED Disposition  Admit   Condition  --   Comment  Hospital Area: [facility] [[id number]]  Level of Care: Telemetry Cardiac [103]  May place patient in observation at [facility] or [facility] if equivalent level of care is available:: No  Covid Evaluation: Asymptomatic - no recent exposure (last 10 days) testing not required  Diagnosis: Chest pain [938101]  Admitting Physician: Angie Fava [[id number]]  Attending Physician: Angie Fava [[id number]]          B Medical/Surgery History Past Medical History:  Diagnosis Date   Aortic stenosis 04/02/2021   Chronic diastolic heart failure (HCC)    CKD (chronic kidney disease) stage 2, GFR 60-89 ml/min 03/17/2012   Coronary artery disease, non-occlusive 03/18/2012   60-70% stenosis in RCA -- FFR 0.84; EF 60-65%   Diabetes mellitus without complication (HCC)    GERD (gastroesophageal reflux disease)    On PPI   History of GI bleed 02/2013   No obvious findings on EGD/colonoscopy   Hydrocele, bilateral    large on [place] 2020   Hyperlipidemia    Hypertension    Obesity (BMI 30.0-34.9) 10/02/2012   OSA (obstructive sleep apnea) 03/18/2012   Doing better with CPAP   Pulmonary hypertension (HCC) 12/10/2010   ECHO:  Mild PH,mild LVH; PA pressures estimated 30-40 mmHg   Pulmonary hypertension, unspecified (HCC)  06/04/2021   RBBB, 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   Event monitor; usually during sleeping hours; therefore not on beta blocker   Past Surgical History:  Procedure Laterality Date   APPENDECTOMY  1974   CARPAL TUNNEL RELEASE     EYE SURGERY  1960   Left eye   HEMORRHOID SURGERY     LEFT HEART CATH AND CORONARY ANGIOGRAPHY  03/27/2010   Moderate mid RCA lesion,right radial approach,normal EF   LEFT HEART CATHETERIZATION WITH CORONARY ANGIOGRAM N/A 03/17/2012   Procedure: LEFT HEART CATHETERIZATION WITH CORONARY ANGIOGRAM;  Surgeon: Marykay Lex, MD;  Location: [facility] CATH LAB;  Service: Cardiovascular: Fractional Flow Reserve Measurement of mid RCA 60-70% stenosis = 0.84. Otherwise mild LCA CAD.  Normal EF & EDP   SHOULDER ARTHROSCOPY  2006   TRANSTHORACIC ECHOCARDIOGRAM  12/2010    Mild concentric LVH.  EF> 55%.  GR 1 DD.  Mild aortic sclerosis no stenosis.  PA pressures estimated 30-40 mmHg     A IV Location/Drains/Wounds Patient Lines/Drains/Airways Status     Active Line/Drains/Airways     Name Placement date Placement time Site Days   Wound 08/03/12 Abrasion(s) Arm Right 08/03/12  0955  Arm  3350            Intake/Output Last 24 hours No intake or output data in the 24 hours ending 10/05/21 2035  Labs/Imaging Results for orders placed or performed during the hospital encounter  of 10/05/21 (from the past 48 hour(s))  CBC     Status: Abnormal   Collection Time: 10/05/21 11:55 AM  Result Value Ref Range   WBC 6.6 4.0 - 10.5 K/uL   RBC 4.46 4.22 - 5.81 MIL/uL   Hemoglobin 12.3 (L) 13.0 - 17.0 g/dL   HCT 70.6 (L) 23.7 - 62.8 %   MCV 83.9 80.0 - 100.0 fL   MCH 27.6 26.0 - 34.0 pg   MCHC 32.9 30.0 - 36.0 g/dL   RDW 31.5 17.6 - 16.0 %   Platelets 204 150 - 400 K/uL   nRBC 0.0 0.0 - 0.2 %    Comment: Performed at [facility], [address], [place], [place] [address]  Basic metabolic panel     Status: Abnormal   Collection Time: 10/05/21 11:55  AM  Result Value Ref Range   Sodium 143 135 - 145 mmol/L   Potassium 3.6 3.5 - 5.1 mmol/L   Chloride 111 98 - 111 mmol/L   CO2 23 22 - 32 mmol/L   Glucose, Bld 159 (H) 70 - 99 mg/dL    Comment: Glucose reference range applies only to samples taken after fasting for at least 8 hours.   BUN 16 8 - 23 mg/dL   Creatinine, Ser 6.26 (H) 0.61 - 1.24 mg/dL   Calcium 9.0 8.9 - 94.8 mg/dL   GFR, Estimated 38 (L) >60 mL/min    Comment: (NOTE) Calculated using the CKD-EPI Creatinine Equation (2021)    Anion gap 9 5 - 15    Comment: Performed at [facility], [address], [place], [place] [address]  Troponin I (High Sensitivity)     Status: None   Collection Time: 10/05/21 11:55 AM  Result Value Ref Range   Troponin I (High Sensitivity) 13 <18 ng/L    Comment: (NOTE) Elevated high sensitivity troponin I (hsTnI) values and significant  changes across serial measurements may suggest ACS but many other  chronic and acute conditions are known to elevate hsTnI results.  Refer to the "Links" section for chest pain algorithms and additional  guidance. Performed at [facility], [address], [place], [place] [address]   Troponin I (High Sensitivity)     Status: None   Collection Time: 10/05/21  4:12 PM  Result Value Ref Range   Troponin I (High Sensitivity) 17 <18 ng/L    Comment: (NOTE) Elevated high sensitivity troponin I (hsTnI) values and significant  changes across serial measurements may suggest ACS but many other  chronic and acute conditions are known to elevate hsTnI results.  Refer to the "Links" section for chest pain algorithms and additional  guidance. Performed at [facility], [address] [place], [place] [address]    DG Chest 2 View  Result Date: 10/05/2021 CLINICAL DATA:  Chest pain. EXAM: CHEST - 2 VIEW COMPARISON:  Jun 11, 2021. FINDINGS: The heart size and mediastinal contours are within normal limits. Minimal bibasilar subsegmental atelectasis  is noted. The visualized skeletal structures are unremarkable. IMPRESSION: Minimal bibasilar subsegmental atelectasis. Electronically Signed   By: Lupita Raider M.D.   On: 10/05/2021 12:15    Pending Labs Unresulted Labs (From admission, onward)     Start     Ordered   10/06/21 0500  CBC with Differential/Platelet  Tomorrow morning,   R        10/05/21 1933   10/06/21 0500  Comprehensive metabolic panel  Tomorrow morning,  R        10/05/21 1933   10/06/21 0500  Magnesium  Tomorrow morning,   R        10/05/21 1933   10/06/21 0500  Lipase, blood  Tomorrow morning,   R        10/05/21 1957   10/05/21 2000  Brain natriuretic peptide  Add-on,   AD        10/05/21 1959   10/05/21 1959  Rapid urine drug screen (hospital performed)  Once,   R        10/05/21 1958   10/05/21 1934  Magnesium  Add-on,   AD        10/05/21 1933            Vitals/Pain Today's Vitals   10/05/21 1845 10/05/21 1900 10/05/21 1915 10/05/21 1959  BP: (!) 148/107 (!) 164/66 (!) 158/65   Pulse: 62 (!) 58 (!) 54   Resp: (!) 22 19 13    Temp:    98 F (36.7 C)  TempSrc:    Oral  SpO2: 95% 95% 95%   Weight:      Height:      PainSc:        Isolation Precautions No active isolations  Medications Medications  nitroGLYCERIN (NITROSTAT) SL tablet 0.4 mg (0.4 mg Sublingual Given 10/05/21 1622)  aspirin tablet 325 mg (has no administration in time range)  acetaminophen (TYLENOL) tablet 650 mg (has no administration in time range)    Or  acetaminophen (TYLENOL) suppository 650 mg (has no administration in time range)  potassium chloride SA (KLOR-CON M) CR tablet 40 mEq (has no administration in time range)  insulin aspart (novoLOG) injection 0-9 Units (has no administration in time range)  pantoprazole (PROTONIX) EC tablet 40 mg (has no administration in time range)  rosuvastatin (CRESTOR) tablet 40 mg (has no administration in time range)  irbesartan (AVAPRO) tablet 300 mg (has no administration in time  range)  doxazosin (CARDURA) tablet 6 mg (has no administration in time range)  HYDROcodone-acetaminophen (NORCO/VICODIN) 5-325 MG per tablet 1 tablet (1 tablet Oral Given 10/05/21 1749)  ketorolac (TORADOL) 15 MG/ML injection 15 mg (15 mg Intramuscular Given 10/05/21 1749)  alum & mag hydroxide-simeth (MAALOX/MYLANTA) 200-200-20 MG/5ML suspension 30 mL (30 mLs Oral Given 10/05/21 1839)    And  lidocaine (XYLOCAINE) 2 % viscous mouth solution 15 mL (15 mLs Oral Given 10/05/21 1839)    Mobility walks Low fall risk   Focused Assessments    R Recommendations: See Admitting Provider Note  Report given to:   Additional Notes:

## 2021-10-05 NOTE — ED Provider Triage Note (Signed)
Emergency Medicine Provider Triage Evaluation Note  James Jones , a 76 y.o. male  was evaluated in triage.  Pt complains of left-sided intermittent chest pain for the past 3 days.  He denies prior CAD however does have history of CAD listed in his chart.  Denies associated shortness of breath, lightheadedness, palpitations, or diaphoresis  Review of Systems  Positive: As above Negative: As above  Physical Exam  There were no vitals taken for this visit. Gen:   Awake, no distress   Resp:  Normal effort MSK:   Moves extremities without difficulty  Other:    Medical Decision Making  Medically screening exam initiated at 11:48 AM.  Appropriate orders placed.  Ollin Hochmuth was informed that the remainder of the evaluation will be completed by another provider, this initial triage assessment does not replace that evaluation, and the importance of remaining in the ED until their evaluation is complete.    Marita Kansas, PA-C 10/05/21 1149

## 2021-10-05 NOTE — ED Provider Notes (Cosign Needed Addendum)
[facility] EMERGENCY DEPARTMENT Provider Note   CSN: [id number] Arrival date & time: 10/05/21  1117     History  Chief Complaint  Patient presents with   Chest Pain    James Jones is a 76 y.o. male.  Pt complains of left-sided intermittent chest pain for the past 3 days.  He denies prior CAD however does have history of CAD listed in his chart.  Denies associated shortness of breath, lightheadedness, palpitations, or diaphoresis.   The history is provided by the patient. No language interpreter was used.       Home Medications Prior to Admission medications   Medication Sig Start Date End Date Taking? Authorizing Provider  albuterol (VENTOLIN HFA) 108 (90 Base) MCG/ACT inhaler Inhale 2 puffs into the lungs every 6 (six) hours as needed for wheezing or shortness of breath. 08/27/21   Maryjane Hurter, MD  allopurinol (ZYLOPRIM) 100 MG tablet TAKE 1 TABLET BY MOUTH EVERY DAY 05/07/21   Susy Frizzle, MD  amLODipine (NORVASC) 10 MG tablet Take 1 tablet (10 mg total) by mouth daily. 01/28/21   Susy Frizzle, MD  Blood Glucose Monitoring Suppl (FREESTYLE LITE) DEVI Use to check blood sugar once daily 11/18/17   Philemon Kingdom, MD  carvedilol (COREG) 25 MG tablet Take 1 tablet (25 mg total) by mouth 2 (two) times daily. 09/10/21   Susy Frizzle, MD  Cholecalciferol (VITAMIN D-3) 1000 UNITS CAPS Take by mouth daily. 2 capsules.    [provider]  dapagliflozin propanediol (FARXIGA) 10 MG TABS tablet Take 1 tablet (10 mg total) by mouth daily before breakfast. 03/17/21   Philemon Kingdom, MD  doxazosin (CARDURA) 2 MG tablet Take 1 tablet (2 mg total) by mouth 2 (two) times daily. TO BE TAKEN WITH 4 MG TABLET FOR TOTAL OF 6 MG  TWICE A DAY 06/04/21   Skeet Latch, MD  doxazosin (CARDURA) 4 MG tablet Take 1 tablet (4 mg total) by mouth 2 (two) times daily. 04/02/21   Skeet Latch, MD  ferrous sulfate 325 (65 FE) MG tablet Take 1 tablet (325  mg total) by mouth 2 (two) times daily with a meal. 06/22/19   Susy Frizzle, MD  fish oil-omega-3 fatty acids 1000 MG capsule Take 1,200 mg by mouth daily.     [provider]  glucose blood (FREESTYLE LITE) test strip USE TO CHECK BLOOD SUGAR 1-2x DAILY. E11.59 09/08/21   Philemon Kingdom, MD  Multiple Vitamin (MULTIVITAMIN WITH MINERALS) TABS Take 1 tablet by mouth daily.    [provider]  omeprazole (PRILOSEC) 20 MG capsule Take 1 capsule (20 mg total) by mouth daily. 07/23/21   Susy Frizzle, MD  PRESCRIPTION MEDICATION Uses C-PAP at bedtime    [provider]  rosuvastatin (CRESTOR) 40 MG tablet Take 1 tablet (40 mg total) by mouth daily. 09/25/20   Skeet Latch, MD  umeclidinium-vilanterol [facility] ELLIPTA) 62.5-25 MCG/ACT AEPB Inhale 1 puff into the lungs daily. 08/27/21   Maryjane Hurter, MD  valsartan (DIOVAN) 320 MG tablet TAKE 1 TABLET BY MOUTH EVERY DAY 04/28/21   Susy Frizzle, MD      Allergies    Atorvastatin    Review of Systems   Review of Systems  Constitutional:  Negative for chills, diaphoresis and fever.  Respiratory:  Negative for shortness of breath.   Cardiovascular:  Positive for chest pain. Negative for palpitations and leg swelling.  Neurological:  Negative for syncope, weakness and light-headedness.  Physical Exam Updated Vital Signs BP (!) 156/65 (BP Location: Right Arm)   Pulse 60   Temp 98.5 F (36.9 C) (Oral)   Resp 20   Ht 5\' 7"  (1.702 m)   Wt 94.3 kg   SpO2 96%   BMI 32.58 kg/m  Physical Exam Vitals and nursing note reviewed.  Constitutional:      General: He is not in acute distress.    Appearance: Normal appearance. He is not ill-appearing.  HENT:     Head: Normocephalic and atraumatic.     Nose: Nose normal.  Eyes:     General: No scleral icterus.       Right eye: Discharge present.        Left eye: Discharge present.    Extraocular Movements: Extraocular movements intact.      Conjunctiva/sclera: Conjunctivae normal.     Comments: Bilateral conjunctivitis.  He reports this is chronic.   Cardiovascular:     Rate and Rhythm: Normal rate and regular rhythm.     Pulses: Normal pulses.  Pulmonary:     Effort: Pulmonary effort is normal. No respiratory distress.     Breath sounds: Normal breath sounds. No wheezing or rales.  Abdominal:     General: There is no distension.     Tenderness: There is no abdominal tenderness.  Musculoskeletal:        General: Normal range of motion.     Cervical back: Normal range of motion.  Skin:    General: Skin is warm and dry.  Neurological:     General: No focal deficit present.     Mental Status: He is alert. Mental status is at baseline.     ED Results / Procedures / Treatments   Labs (all labs ordered are listed, but only abnormal results are displayed) Labs Reviewed  CBC - Abnormal; Notable for the following components:      Result Value   Hemoglobin 12.3 (*)    HCT 37.4 (*)    All other components within normal limits  BASIC METABOLIC PANEL - Abnormal; Notable for the following components:   Glucose, Bld 159 (*)    Creatinine, Ser 1.80 (*)    GFR, Estimated 38 (*)    All other components within normal limits  TROPONIN I (HIGH SENSITIVITY)  TROPONIN I (HIGH SENSITIVITY)    EKG None  Radiology DG Chest 2 View  Result Date: 10/05/2021 CLINICAL DATA:  Chest pain. EXAM: CHEST - 2 VIEW COMPARISON:  Jun 11, 2021. FINDINGS: The heart size and mediastinal contours are within normal limits. Minimal bibasilar subsegmental atelectasis is noted. The visualized skeletal structures are unremarkable. IMPRESSION: Minimal bibasilar subsegmental atelectasis. Electronically Signed   By: Jun 13, 2021 M.D.   On: 10/05/2021 12:15    Procedures Procedures    Medications Ordered in ED Medications - No data to display  ED Course/ Medical Decision Making/ A&P Clinical Course as of 10/05/21 1927  Mon Oct 05, 2021  1921  Patient discussed with Dr. Oct 07, 2021 who agrees with admission to medicine service and cardiology will evaluate patient tomorrow to determine what studies they need from cardiac standpoint for further evaluation.  [AA]    Clinical Course User Index [AA] Anne Fu, PA-C                           Medical Decision Making Amount and/or Complexity of Data Reviewed Labs: ordered. Radiology: ordered.  Risk OTC drugs. Prescription drug  management. Decision regarding hospitalization.   Medical Decision Making / ED Course   This patient presents to the ED for concern of left-sided chest pain, this involves an extensive number of treatment options, and is a complaint that carries with it a high risk of complications and morbidity.  The differential diagnosis includes ACS, MSK pain, PE, pneumonia  MDM: 76 year old male with past medical history significant for CAD, COPD, CKD presents today for left-sided chest pain.  Started this morning.  Without associated lightheadedness, palpitations, nausea, or diaphoresis.  It is not pleuritic in nature.  Not made worse by palpation.  Denies cough or shortness of breath.  He does have history of acid reflux as well and reports compliance with pantoprazole.  On exam bilateral conjunctivitis noted with drainage.  Both patient and wife state this is chronic.  He denies pain in his eye.  Work-up that was initiated and triage shows creatinine of 1.8 which is slightly improved from his baseline, glucose 159 otherwise without acute changes, CBC with hemoglobin of 12.3 was without acute changes.  Initial troponin 13.  Repeat pending. Repeat troponin 70.  Bradycardia but remains without lightheadedness or other complaints.  Despite nitro, Toradol, Norco, and GI cocktail patient without any improvement.  Will discuss with hospitalist to admit for ACS rule out.  Low risk for PE given no hypoxia, tachycardia, or tachypnea.  He denies shortness of breath.  Low suspicion for PE.   He does have bilateral subsegmental atelectasis.  Given his history of COPD question COPD exacerbation.  However he is without wheezing, cough, or fever.  Low suspicion for COPD exacerbation.   Lab Tests: -I ordered, reviewed, and interpreted labs.   The pertinent results include:   Labs Reviewed  CBC - Abnormal; Notable for the following components:      Result Value   Hemoglobin 12.3 (*)    HCT 37.4 (*)    All other components within normal limits  BASIC METABOLIC PANEL - Abnormal; Notable for the following components:   Glucose, Bld 159 (*)    Creatinine, Ser 1.80 (*)    GFR, Estimated 38 (*)    All other components within normal limits  TROPONIN I (HIGH SENSITIVITY)  TROPONIN I (HIGH SENSITIVITY)      EKG  EKG Interpretation  Date/Time:    Ventricular Rate:    PR Interval:    QRS Duration:   QT Interval:    QTC Calculation:   R Axis:     Text Interpretation:           Imaging Studies ordered: I ordered imaging studies including chest x-ray I independently visualized and interpreted imaging. I agree with the radiologist interpretation   Medicines ordered and prescription drug management: Meds ordered this encounter  Medications   nitroGLYCERIN (NITROSTAT) SL tablet 0.4 mg    -I have reviewed the patients home medicines and have made adjustments as needed  Cardiac Monitoring: The patient was maintained on a cardiac monitor.  I personally viewed and interpreted the cardiac monitored which showed an underlying rhythm of: Bradycardia  Reevaluation: After the interventions noted above, I reevaluated the patient and found that they have :stayed the same  Co morbidities that complicate the patient evaluation  Past Medical History:  Diagnosis Date   Aortic stenosis 04/02/2021   CKD (chronic kidney disease) stage 2, GFR 60-89 ml/min 03/17/2012   Coronary artery disease, non-occlusive 03/18/2012   60-70% stenosis in RCA -- FFR 0.84; EF 60-65%   Diabetes  mellitus  without complication (HCC)    GERD (gastroesophageal reflux disease)    On PPI   History of GI bleed January 2015   No obvious findings on EGD/colonoscopy   Hydrocele, bilateral    large on [place] 2020   Hyperlipidemia    Hypertension    Obesity (BMI 30.0-34.9) 10/02/2012   OSA (obstructive sleep apnea) 03/18/2012   Doing better with CPAP   Pulmonary hypertension (HCC) 12/10/2010   ECHO:  Mild PH,mild LVH; PA pressures estimated 30-40 mmHg   Pulmonary hypertension, unspecified (HCC) 06/04/2021   RBBB, 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   Final Clinical Impression(s) / ED Diagnoses Final diagnoses:  Moderate risk chest pain    Rx / DC Orders ED Discharge Orders     None         Marita Kansas, PA-C 10/05/21 1927    Marita Kansas, PA-C 10/05/21 1930    Benjiman Core, MD 10/06/21 [phone]

## 2021-10-05 NOTE — H&P (Signed)
History and Physical    PLEASE NOTE THAT DRAGON DICTATION SOFTWARE WAS USED IN THE CONSTRUCTION OF THIS NOTE.   James Jones ZDG:[id number] DOB: [birth year] DOA: 10/05/2021  PCP: Donita Brooks, MD  Patient coming from: home   I have personally briefly reviewed patient's old medical records in [facility]  Chief Complaint: chest pain  HPI: James Jones is a 76 y.o. male with medical history significant for coronary artery disease with coronary angiography in February 2014 showing 60 to 70% stenosis of the RCA, mild aortic stenosis, type 2 diabetes mellitus, GERD, hypertension, hyperlipidemia, obstructive sleep apnea on nocturnal CPAP, chronic diastolic heart failure, CKD 3B associated baseline creatinine range 1.6-1.9, who is admitted to [facility] on 10/05/2021 for further evaluation and management of chest pain after presenting from home to [facility] ED complaining of such.   The following history is obtained via my discussions with the patient in addition to my discussions with the EDP and via chart review.  The patient reports 4 days of intermittent sharp, left-sided chest discomfort, with first episode starting on Thursday, 10/01/2021.  He notes radiation of the pain laterally, to the area just inferior to the axilla, but denies any radiation into the back, shoulder, or left arm proper.  Chest pain is been nonpleuritic and not reproducible with direct palpation over the left anterior chest wall.  However, he does note an element of reproducibility of his chest discomfort with certain rotational movements of his torso as well as with laying on his left side.  He states " I am concerned that he may have a muscle strain".   These episodes of chest pain have been occurring at rest, including when the patient is sitting watching television.  They are nonexertional.  He notes that he experienced an episode of the above left-sided chest discomfort [place] [facility] [place]  he then walked 6 miles on the stepper, without any exacerbation of his chest discomfort.  No improvement in the chest pain with rest, and he confirms that he has not taken a sublingual nitroglycerin for this chest discomfort at home.  He did receive a sublingual nitroglycerin x1 in the ED this evening, noting no ensuing improvement with this measure.  Denies any associated shortness of breath, nausea, vomiting, diaphoresis, dizziness, palpitations, presyncope, or syncope.  This is also not associate any subjective fever, chills, rigors, generalized myalgias, nor any new onset cough, hemoptysis, wheezing.   Denies any personal history of DVT/PE.  No recent trauma or travel denies any new peripheral swelling, erythema, calf tenderness.   While the patient denies any history of coronary artery disease, chart review reveals a history of nonobstructive CAD.  Specifically, left-sided coronary angiography in February 2014 revealed 60 to 70% stenosis of the RCA.  This appears to be the patient's most recent left-sided coronary angiography.  He notes that he may have underwent cardiac stress test after this left-sided heart cath, but states that this would have been several years ago.    Most recent echocardiogram was performed on 05/26/2021 and was notable for LVEF 60 to 65%, no focal wall motion abnormalities, grade 2 diastolic dysfunction, normal right ventricular systolic function and showed severely elevated pulmonary artery systolic pressures.  His echo was also notable for trivial mitral regurgitation and mild aortic stenosis, with aortic valve area of 1.9 cm2 calculated via continuity equation using V-max.  Medical history notable for type 2 diabetes mellitus, hypertension, hyperlipidemia.  He also has a  history of stage IIIb chronic kidney disease with baseline creatinine 1.6-1.9, with most recent prior serum creatinine data point noted to be 1.82 in March 2023.    ED Course:  Vital signs in the ED  were notable for the following: Afebrile; heart rate [id number]; systolic blood pressures in the 130s to 160s mmHg; respiratory rate 16-22, oxygen saturation 95 to 100% on room air.  Labs were notable for the following: BMP notable for potassium 3.6, bicarbonate 23, creatinine 1.80, glucose 159.  Initial high-sensitivity troponin I found to be 13, 35 trending up slightly to 17.  CBC notable for will with cell count 6600, hemoglobin 12.3 compared to most recent prior hemoglobin of 13.4 on 10/16/20.  Imaging and additional notable ED work-up: EKG, interpretation of which is limited by the presence of artifact.  However, within these confines, appears to demonstrate sinus bradycardia with single PVC, heart rate 57, right bundle branch block with QRS 140 ms, and she is no overt evidence of T wave or ST changes, including no evidence of ST elevation.  2 view chest x-ray shows minimal bibasilar subsegmental atelectasis, but otherwise no evidence of acute cardiopulmonary process, including no evidence of infiltrate, edema, effusion, or pneumothorax.  EDP discussed the patient's case with the on-call cardiologist, Dr.Skains, Who recommended admission to the hospital service for further evaluation management of presenting chest discomfort.  Dr. Marlou Porch conveyed that cardiology would formally consult, see the patient, and determine need/nature of additional ischemic evaluation.   While in the ED, the following were administered: GI cocktail with viscous lidocaine, Toradol 15 mg IM x1, Norco 5/325 mg p.o. x1, and sublingual nitroglycerin x1 dose.  Patient with mild residual chest discomfort after these measures, which she states are for dosing no improvement in the intensity of his discomfort.  Subsequently, the patient was admitted for overnight observation for further evaluation management of presenting chest discomfort.     Review of Systems: As per HPI otherwise 10 point review of systems negative.   Past Medical  History:  Diagnosis Date   Aortic stenosis 04/02/2021   Chronic diastolic heart failure (HCC)    CKD (chronic kidney disease) stage 2, GFR 60-89 ml/min 03/17/2012   Coronary artery disease, non-occlusive 03/18/2012   60-70% stenosis in RCA -- FFR 0.84; EF 60-65%   Diabetes mellitus without complication (HCC)    GERD (gastroesophageal reflux disease)    On PPI   History of GI bleed 02/2013   No obvious findings on EGD/colonoscopy   Hydrocele, bilateral    large on [place] 2020   Hyperlipidemia    Hypertension    Obesity (BMI 30.0-34.9) 10/02/2012   OSA (obstructive sleep apnea) 03/18/2012   Doing better with CPAP   Pulmonary hypertension ([place]) 12/10/2010   ECHO:  Mild PH,mild LVH; PA pressures estimated 30-40 mmHg   Pulmonary hypertension, unspecified ([place]) 06/04/2021   RBBB, 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   Event monitor; usually during sleeping hours; therefore not on beta blocker    Past Surgical History:  Procedure Laterality Date   [place]   Left eye   HEMORRHOID SURGERY     LEFT HEART CATH AND CORONARY ANGIOGRAPHY  03/27/2010   Moderate mid RCA lesion,right radial approach,normal EF   LEFT HEART CATHETERIZATION WITH CORONARY ANGIOGRAM N/A 03/17/2012   Procedure: LEFT HEART CATHETERIZATION WITH CORONARY ANGIOGRAM;  Surgeon: Leonie Man, MD;  Location: [facility] CATH LAB;  Service:  Cardiovascular: Fractional Flow Reserve Measurement of mid RCA 60-70% stenosis = 0.84. Otherwise mild LCA CAD.  Normal EF & EDP   SHOULDER ARTHROSCOPY  2006   TRANSTHORACIC ECHOCARDIOGRAM  12/2010    Mild concentric LVH.  EF> 55%.  GR 1 DD.  Mild aortic sclerosis no stenosis.  PA pressures estimated 30-40 mmHg    Social History:  reports that he quit smoking about a year ago. His smoking use included cigarettes. He has a 100.00 pack-year smoking history. He has never used smokeless tobacco. He reports current alcohol use of  about 1.0 standard drink of alcohol per week. He reports current drug use. Drug: Marijuana.   Allergies  Allergen Reactions   Atorvastatin     myalgias    Family History  Problem Relation Age of Onset   Hypertension Mother    Kidney failure Mother    Coronary artery disease Father    Hypertension Sister    Kidney failure Sister    Stroke Maternal Grandmother     Family history reviewed and not pertinent    Prior to Admission medications   Medication Sig Start Date End Date Taking? Authorizing Provider  allopurinol (ZYLOPRIM) 100 MG tablet TAKE 1 TABLET BY MOUTH EVERY DAY 05/07/21  Yes Susy Frizzle, MD  amLODipine (NORVASC) 10 MG tablet Take 1 tablet (10 mg total) by mouth daily. 01/28/21  Yes Susy Frizzle, MD  carvedilol (COREG) 25 MG tablet Take 1 tablet (25 mg total) by mouth 2 (two) times daily. 09/10/21  Yes Susy Frizzle, MD  Cholecalciferol (VITAMIN D-3) 1000 UNITS CAPS Take by mouth daily. 2 capsules.   Yes [provider]  dapagliflozin propanediol (FARXIGA) 10 MG TABS tablet Take 1 tablet (10 mg total) by mouth daily before breakfast. 03/17/21  Yes Philemon Kingdom, MD  doxazosin (CARDURA) 2 MG tablet Take 1 tablet (2 mg total) by mouth 2 (two) times daily. TO BE TAKEN WITH 4 MG TABLET FOR TOTAL OF 6 MG  TWICE A DAY 06/04/21  Yes Skeet Latch, MD  doxazosin (CARDURA) 4 MG tablet Take 1 tablet (4 mg total) by mouth 2 (two) times daily. 04/02/21  Yes Skeet Latch, MD  ferrous sulfate 325 (65 FE) MG tablet Take 1 tablet (325 mg total) by mouth 2 (two) times daily with a meal. 06/22/19  Yes Susy Frizzle, MD  fish oil-omega-3 fatty acids 1000 MG capsule Take 1,200 mg by mouth daily.    Yes [provider]  Multiple Vitamin (MULTIVITAMIN WITH MINERALS) TABS Take 1 tablet by mouth daily.   Yes [provider]  omeprazole (PRILOSEC) 20 MG capsule Take 1 capsule (20 mg total) by mouth daily. 07/23/21  Yes Susy Frizzle, MD   PRESCRIPTION MEDICATION Uses C-PAP at bedtime   Yes [provider]  rosuvastatin (CRESTOR) 40 MG tablet Take 1 tablet (40 mg total) by mouth daily. 09/25/20  Yes Skeet Latch, MD  valsartan (DIOVAN) 320 MG tablet TAKE 1 TABLET BY MOUTH EVERY DAY 04/28/21  Yes Susy Frizzle, MD  albuterol (VENTOLIN HFA) 108 (90 Base) MCG/ACT inhaler Inhale 2 puffs into the lungs every 6 (six) hours as needed for wheezing or shortness of breath. Patient not taking: Reported on 10/05/2021 08/27/21   Maryjane Hurter, MD  Blood Glucose Monitoring Suppl (FREESTYLE LITE) DEVI Use to check blood sugar once daily 11/18/17   Philemon Kingdom, MD  glucose blood (FREESTYLE LITE) test strip USE TO CHECK BLOOD SUGAR 1-2x DAILY. E11.59 09/08/21  Philemon Kingdom, MD  umeclidinium-vilanterol [facility] ELLIPTA) 62.5-25 MCG/ACT AEPB Inhale 1 puff into the lungs daily. Patient not taking: Reported on 10/05/2021 08/27/21   Maryjane Hurter, MD     Objective    Physical Exam: Vitals:   10/05/21 1845 10/05/21 1900 10/05/21 1915 10/05/21 1959  BP: (!) 148/107 (!) 164/66 (!) 158/65   Pulse: 62 (!) 58 (!) 54   Resp: (!) 22 19 13    Temp:    98 F (36.7 C)  TempSrc:    Oral  SpO2: 95% 95% 95%   Weight:      Height:        General: appears to be stated age; alert, oriented Skin: warm, dry, no rash Head:  AT/[place] Mouth:  Oral mucosa membranes appear moist, normal dentition Neck: supple; trachea midline Chest: No reproducibility of patient's chest discomfort with direct palpation over the left portion of the anterior chest wall. Heart:  RRR; did not appreciate any M/R/G Lungs: CTAB, did not appreciate any wheezes, rales, or rhonchi Abdomen: + BS; soft, ND, NT Vascular: 2+ pedal pulses b/l; 2+ radial pulses b/l Extremities: no peripheral edema, no muscle wasting Neuro: strength and sensation intact in upper and lower extremities b/l   Labs on Admission: I have personally reviewed following labs and  imaging studies  CBC: Recent Labs  Lab 10/05/21 1155  WBC 6.6  HGB 12.3*  HCT 37.4*  MCV 83.9  PLT 0000000   Basic Metabolic Panel: Recent Labs  Lab 10/05/21 1155  NA 143  K 3.6  CL 111  CO2 23  GLUCOSE 159*  BUN 16  CREATININE 1.80*  CALCIUM 9.0   GFR: Estimated Creatinine Clearance: 37.6 mL/min (A) (by C-G formula based on SCr of 1.8 mg/dL (H)). Liver Function Tests: No results for input(s): "AST", "ALT", "ALKPHOS", "BILITOT", "PROT", "ALBUMIN" in the last 168 hours. No results for input(s): "LIPASE", "AMYLASE" in the last 168 hours. No results for input(s): "AMMONIA" in the last 168 hours. Coagulation Profile: No results for input(s): "INR", "PROTIME" in the last 168 hours. Cardiac Enzymes: No results for input(s): "CKTOTAL", "CKMB", "CKMBINDEX", "TROPONINI" in the last 168 hours. BNP (last 3 results) No results for input(s): "PROBNP" in the last 8760 hours. HbA1C: No results for input(s): "HGBA1C" in the last 72 hours. CBG: No results for input(s): "GLUCAP" in the last 168 hours. Lipid Profile: No results for input(s): "CHOL", "HDL", "LDLCALC", "TRIG", "CHOLHDL", "LDLDIRECT" in the last 72 hours. Thyroid Function Tests: No results for input(s): "TSH", "T4TOTAL", "FREET4", "T3FREE", "THYROIDAB" in the last 72 hours. Anemia Panel: No results for input(s): "VITAMINB12", "FOLATE", "FERRITIN", "TIBC", "IRON", "RETICCTPCT" in the last 72 hours. Urine analysis:    Component Value Date/Time   COLORURINE YELLOW 04/30/2020 [place] 04/30/2020 1647   LABSPEC 1.015 04/30/2020 1647   PHURINE 5.5 04/30/2020 1647   GLUCOSEU 2+ (A) 04/30/2020 1647   HGBUR TRACE (A) 04/30/2020 1647   BILIRUBINUR NEGATIVE 10/05/2017 1201   KETONESUR NEGATIVE 04/30/2020 1647   PROTEINUR 3+ (A) 04/30/2020 1647   UROBILINOGEN 0.2 02/13/2013 1410   NITRITE NEGATIVE 04/30/2020 1647   LEUKOCYTESUR NEGATIVE 04/30/2020 1647    Radiological Exams on Admission: DG Chest 2  View  Result Date: 10/05/2021 CLINICAL DATA:  Chest pain. EXAM: CHEST - 2 VIEW COMPARISON:  Jun 11, 2021. FINDINGS: The heart size and mediastinal contours are within normal limits. Minimal bibasilar subsegmental atelectasis is noted. The visualized skeletal structures are unremarkable. IMPRESSION: Minimal bibasilar subsegmental atelectasis. Electronically Signed  By: Marijo Conception M.D.   On: 10/05/2021 12:15     EKG: Independently reviewed, with result as described above.    Assessment/Plan   Principal Problem:   Atypical chest pain Active Problems:   Essential hypertension   Dyslipidemia, goal LDL below 70   Controlled diabetes mellitus with circulatory complication, without long-term current use of insulin (HCC)   OSA on CPAP   GERD (gastroesophageal reflux disease)   Chronic diastolic heart failure (HCC)   Stage 3b chronic kidney disease (CKD) ([place])      #) Atypical chest pain: 4 days of intermittent, sharp left-sided chest pain that appears to have a positional component and element of reproducibility with certain movements of the upper torso, with this pain noted to be nonexertional, nonpleuritic, and not responsive to sublingual nitroglycerin.  Given this description, presenting chest pain appears to be atypical for ACS.  However, given the presence of left-sided chest pain in this patient with known CAD as well as multiple risk factors predisposing him to interval progression of underlying nonobstructive disease identified on most recent prior coronary angiography (60 to 70% stenosis of RCA and February 2014) , who has not recently undergone stress test, and a presenting heart score of 5 conveying moderate risk of major acute cardiac event over the ensuing 6 weeks, the decision was made to admit patient for overnight observation for further evaluation of his presenting chest pain.  Of note, presentation appears less suggestive of pulmonary embolism.  Troponin x2 and nonelevated,  We will EKG shows no evidence of acute ischemic changes including no evidence of STEMI.  However, noted to be a degree of artifact associate with this initial EKG. will order updated EKG to further evaluate.  He exhibits mild sinus bradycardia in the ED which was notable given the distribution of the patient's known nonobstructive CAD involving the RCA.  Is normotensive, and we will continue to monitor heart rate while acknowledging that he is also on Coreg as an outpatient.  Chest x-ray without any evidence of acute process, including no evidence of pneumothorax.  I described above, EDP discussed case with cardiology, who will formally consult, and evaluate patient in the morning to determine need/nature of additional coronary ischemic evaluation.   He is status post full dose aspirin x1 in the ED this evening.  In the setting of his current bradycardia, will hold this evening's dose of Coreg.    Plan: trend serial troponin. Monitor on telemetry. PRN sublingual nitroglycerin.  Potassium chloride 40 mill) p.o. x1 dose now in order to maintain serum potassium level greater than or equal to 4.0.  Add on serum magnesium level.  CMP in the morning.  Check lipase.  Repeat CBC in the morning.  Echocardiogram ordered for the morning.  Urinary drug screen.  Repeat EKG in the setting of prior artifact.  Continue home high intensity atorvastatin.  Continue home valsartan.             #) Chronic diastolic heart failure: documented history of such, with most recent echocardiogram performed in April 2023 notable for LVEF 60 to 65% as well as grade 2 diastolic dysfunction, with additional details as conveyed above. No clinical or radiographic evidence to suggest acutely decompensated heart failure at this time. home diuretic regimen reportedly consists of the following: None.    Plan: monitor strict I's & O's and daily weights. Repeat CMP in AM. Check serum mag level.           #)  Type 2 Diabetes  [place]: documented history of such. Home insulin regimen: None. Home oral hypoglycemic agents: Dapagliflozin. presenting blood sugar: 159.  Most recent hemoglobin A1c noted to be 6.5% on 09/08/2021.   Plan: accuchecks QAC and HS with low dose SSI. hold home oral hypoglycemic agents during this hospitalization.           #) GERD: documented h/o such; on omeprazole as outpatient.   Plan: continue home PPI.             #) Essential Hypertension: documented h/o such, with outpatient antihypertensive regimen including amlodipine, Coreg, doxazosin, valsartan.  SBP's in the ED today: 130s to 160s mmHg. start above, in the setting of sinus bradycardia noted in the ED, will hold home Coreg this evening.   Plan: Close monitoring of subsequent BP via routine VS. hold beta-blocker, and continue home valsartan, doxazosin.  Can likely also resume home amlodipine, noting next dose due tomorrow morning.          #) Hyperlipidemia: documented h/o such. On high intensity rosuvastatin as outpatient.    Plan: continue home statin.            #) Obstructive sleep apnea: Documented history of such, with patient reporting good compliance with home nocturnal CPAP.  Plan: I placed an order with respiratory therapy, requesting provision of nocturnal CPAP at home settings.          #) CKD 3B: Associated with baseline creatinine range 1.6-1.9, with presenting serum creatinine consistent with his baseline.  Plan: Monitor strict I's and O's Daily weights.  Tempt avoid nephrotoxic agents.  Repeat CMP in the morning.      DVT prophylaxis: SCD's   Code Status: DNR (per my discussions with the patient this evening, he wishes to be DNR) Family Communication: none Disposition Plan: Per Rounding Team Consults called: EDP discussed case with on-call cardiology, Dr.Skains, Who will formally consult, as further detailed above;  Admission status: Observation; cardiac  telemetry.    PLEASE NOTE THAT DRAGON DICTATION SOFTWARE WAS USED IN THE CONSTRUCTION OF THIS NOTE.   James Born Jonie Burdell DO [facility]  From 7PM - 7AM   10/05/2021, 8:17 PM

## 2021-10-06 ENCOUNTER — Observation Stay (HOSPITAL_BASED_OUTPATIENT_CLINIC_OR_DEPARTMENT_OTHER): Payer: Medicare Other

## 2021-10-06 DIAGNOSIS — E785 Hyperlipidemia, unspecified: Secondary | ICD-10-CM | POA: Diagnosis not present

## 2021-10-06 DIAGNOSIS — I10 Essential (primary) hypertension: Secondary | ICD-10-CM | POA: Diagnosis not present

## 2021-10-06 DIAGNOSIS — I130 Hypertensive heart and chronic kidney disease with heart failure and stage 1 through stage 4 chronic kidney disease, or unspecified chronic kidney disease: Secondary | ICD-10-CM | POA: Diagnosis not present

## 2021-10-06 DIAGNOSIS — N1832 Chronic kidney disease, stage 3b: Secondary | ICD-10-CM | POA: Diagnosis not present

## 2021-10-06 DIAGNOSIS — G4733 Obstructive sleep apnea (adult) (pediatric): Secondary | ICD-10-CM | POA: Diagnosis not present

## 2021-10-06 DIAGNOSIS — I272 Pulmonary hypertension, unspecified: Secondary | ICD-10-CM | POA: Diagnosis not present

## 2021-10-06 DIAGNOSIS — R079 Chest pain, unspecified: Secondary | ICD-10-CM

## 2021-10-06 DIAGNOSIS — E119 Type 2 diabetes mellitus without complications: Secondary | ICD-10-CM | POA: Diagnosis not present

## 2021-10-06 DIAGNOSIS — Z87891 Personal history of nicotine dependence: Secondary | ICD-10-CM | POA: Diagnosis not present

## 2021-10-06 DIAGNOSIS — I5032 Chronic diastolic (congestive) heart failure: Secondary | ICD-10-CM | POA: Diagnosis not present

## 2021-10-06 DIAGNOSIS — Z79899 Other long term (current) drug therapy: Secondary | ICD-10-CM | POA: Diagnosis not present

## 2021-10-06 DIAGNOSIS — I2510 Atherosclerotic heart disease of native coronary artery without angina pectoris: Secondary | ICD-10-CM | POA: Diagnosis not present

## 2021-10-06 DIAGNOSIS — R0789 Other chest pain: Secondary | ICD-10-CM | POA: Diagnosis not present

## 2021-10-06 LAB — COMPREHENSIVE METABOLIC PANEL
ALT: 27 U/L (ref 0–44)
AST: 28 U/L (ref 15–41)
Albumin: 3.4 g/dL — ABNORMAL LOW (ref 3.5–5.0)
Alkaline Phosphatase: 78 U/L (ref 38–126)
Anion gap: 9 (ref 5–15)
BUN: 14 mg/dL (ref 8–23)
CO2: 23 mmol/L (ref 22–32)
Calcium: 8.9 mg/dL (ref 8.9–10.3)
Chloride: 108 mmol/L (ref 98–111)
Creatinine, Ser: 1.78 mg/dL — ABNORMAL HIGH (ref 0.61–1.24)
GFR, Estimated: 39 mL/min — ABNORMAL LOW (ref >60)
Glucose, Bld: 184 mg/dL — ABNORMAL HIGH (ref 70–99)
Potassium: 3.2 mmol/L — ABNORMAL LOW (ref 3.5–5.1)
Sodium: 140 mmol/L (ref 135–145)
Total Bilirubin: 0.3 mg/dL (ref 0.3–1.2)
Total Protein: 5.9 g/dL — ABNORMAL LOW (ref 6.5–8.1)

## 2021-10-06 LAB — ECHOCARDIOGRAM COMPLETE
AR max vel: 1.73 cm2
AV Area VTI: 1.95 cm2
AV Area mean vel: 1.66 cm2
AV Mean grad: 14 mmHg
AV Peak grad: 25.8 mmHg
Ao pk vel: 2.54 m/s
Area-P 1/2: 3.85 cm2
Height: 67 in
S' Lateral: 3 cm
Weight: 3328 oz

## 2021-10-06 LAB — TROPONIN I (HIGH SENSITIVITY)
Troponin I (High Sensitivity): 14 ng/L (ref <18)
Troponin I (High Sensitivity): 17 ng/L (ref <18)

## 2021-10-06 LAB — LIPASE, BLOOD: Lipase: 47 U/L (ref 11–51)

## 2021-10-06 LAB — MAGNESIUM: Magnesium: 1.5 mg/dL — ABNORMAL LOW (ref 1.7–2.4)

## 2021-10-06 LAB — GLUCOSE, CAPILLARY
Glucose-Capillary: 110 mg/dL — ABNORMAL HIGH (ref 70–99)
Glucose-Capillary: 154 mg/dL — ABNORMAL HIGH (ref 70–99)

## 2021-10-06 MED ORDER — MAGNESIUM SULFATE 2 GM/50ML IV SOLN: 2.0000 g | Freq: Once | INTRAVENOUS | Status: DC

## 2021-10-06 MED ORDER — PANTOPRAZOLE SODIUM 40 MG PO TBEC: 40.0000 mg | DELAYED_RELEASE_TABLET | Freq: Every day | ORAL | 1 refills | Status: DC

## 2021-10-06 MED ORDER — CARVEDILOL 25 MG PO TABS: 12.5000 mg | ORAL_TABLET | Freq: Two times a day (BID) | ORAL | 1 refills | Status: DC

## 2021-10-06 NOTE — Progress Notes (Signed)
Pt placed on cpap and seems to be tolerating it well at this time.  

## 2021-10-06 NOTE — Discharge Summary (Signed)
Physician Discharge Summary  Hooper Petteway POE:[id number] DOB: 1945/12/02 DOA: 10/05/2021  PCP: Donita Brooks, MD  Admit date: 10/05/2021 Discharge date: 10/06/2021  Admitted From: Home Disposition:  Home   Recommendations for Outpatient Follow-up:  Follow up with PCP in 1-2 weeks Please obtain BMP/CBC in one week To follow-up with his cardiologist, appointment scheduled with cardiology for 10/16/2021 Just antihypertensive medication as needed, patient blood pressure has been soft, and with bradycardia on admission, so Coreg has been decreased by half, and his amlodipine has been stopped, as well doxazosin has been decreased as well, adjust this as needed.    Discharge Condition:Stable CODE STATUS:FULL Diet recommendation: Heart Healthy / Carb Modified   Brief/Interim Summary:  James Jones is a 76 y.o. male with medical history significant for coronary artery disease with coronary angiography in February 2014 showing 60 to 70% stenosis of the RCA, mild aortic stenosis, type 2 diabetes mellitus, GERD, hypertension, hyperlipidemia, obstructive sleep apnea on nocturnal CPAP, chronic diastolic heart failure, CKD 3B associated baseline creatinine range 1.6-1.9, who is admitted to [facility] on 10/05/2021 for further evaluation and management of chest pain after presenting from home to [facility] ED complaining of such.   Atypical chest pain: -  4 days of intermittent, sharp left-sided chest pain that appears to have a positional component and element of reproducibility with certain movements of the upper torso, with this pain noted to be nonexertional, nonpleuritic, and not responsive to sublingual nitroglycerin -Neurology input greatly appreciated, appears noncardiac chest pain, EKG nonacute, troponins negative x3, this point no further recommendation for inpatient work-up, appointment has been scheduled for outpatient follow-up for possible stress versus cardiac cath  Chronic diastolic  heart failure -Euvolemic  Type 2 diabetes mellitus -Continue with home medications  GERD  -We will change his omeprazole to pantoprazole as chest pain may be related to reflux  Hypertension -Overall blood pressure has been on the lower side to acceptable during hospital stay, he is on multiple outpatient antihypertensive medication including amlodipine, Coreg, doxazosin and valsartan, he was only kept on doxazosin and valsartan and blood pressure has remained acceptable and that, Coreg has been held for bradycardia heart rate in the 50s, so upon discharge will discontinue amlodipine, decrease his Coreg from 25 mg p.o. twice daily to 12.5 mg p.o. twice daily due to bradycardia, and will decrease his doxazosin as well, continue with losartan.  Hyperlipidemia -Continue with rosuvastatin   Discharge Diagnoses:  Principal Problem:   Atypical chest pain Active Problems:   Essential hypertension   Dyslipidemia, goal LDL below 70   Controlled diabetes mellitus with circulatory complication, without long-term current use of insulin (HCC)   OSA on CPAP   GERD (gastroesophageal reflux disease)   Chronic diastolic heart failure (HCC)   Stage 3b chronic kidney disease (CKD) (HCC)    Discharge Instructions  Discharge Instructions     Diet - low sodium heart healthy   Complete by: As directed    Discharge instructions   Complete by: As directed    Follow with Primary MD Donita Brooks, MD   Get CBC, CMP,  checked  by Primary MD next visit.    Activity: As tolerated with Full fall precautions use walker/cane & assistance as needed   Disposition Home    Diet: Heart Healthy   On your next visit with your primary care physician please Get Medicines reviewed and adjusted.   Please request your Prim.MD to go over all Hospital Tests and Procedure/Radiological results at  the follow up, please get all Hospital records sent to your Prim MD by signing hospital release before you go  home.   If you experience worsening of your admission symptoms, develop shortness of breath, life threatening emergency, suicidal or homicidal thoughts you must seek medical attention immediately by calling 911 or calling your MD immediately  if symptoms less severe.  You Must read complete instructions/literature along with all the possible adverse reactions/side effects for all the Medicines you take and that have been prescribed to you. Take any new Medicines after you have completely understood and accpet all the possible adverse reactions/side effects.   Do not drive, operating heavy machinery, perform activities at heights, swimming or participation in water activities or provide baby sitting services if your were admitted for syncope or siezures until you have seen by Primary MD or a Neurologist and advised to do so again.  Do not drive when taking Pain medications.    Do not take more than prescribed Pain, Sleep and Anxiety Medications  Special Instructions: If you have smoked or chewed Tobacco  in the last 2 yrs please stop smoking, stop any regular Alcohol  and or any Recreational drug use.  Wear Seat belts while driving.   Please note  You were cared for by a hospitalist during your hospital stay. If you have any questions about your discharge medications or the care you received while you were in the hospital after you are discharged, you can call the unit and asked to speak with the hospitalist on call if the hospitalist that took care of you is not available. Once you are discharged, your primary care physician will handle any further medical issues. Please note that NO REFILLS for any discharge medications will be authorized once you are discharged, as it is imperative that you return to your primary care physician (or establish a relationship with a primary care physician if you do not have one) for your aftercare needs so that they can reassess your need for medications and  monitor your lab values.   Increase activity slowly   Complete by: As directed       Allergies as of 10/06/2021       Reactions   Atorvastatin    myalgias        Medication List     STOP taking these medications    amLODipine 10 MG tablet Commonly known as: NORVASC   omeprazole 20 MG capsule Commonly known as: PRILOSEC Replaced by: pantoprazole 40 MG tablet       TAKE these medications    albuterol 108 (90 Base) MCG/ACT inhaler Commonly known as: VENTOLIN HFA Inhale 2 puffs into the lungs every 6 (six) hours as needed for wheezing or shortness of breath.   allopurinol 100 MG tablet Commonly known as: ZYLOPRIM TAKE 1 TABLET BY MOUTH EVERY DAY   Anoro Ellipta 62.5-25 MCG/ACT Aepb Generic drug: umeclidinium-vilanterol Inhale 1 puff into the lungs daily.   carvedilol 25 MG tablet Commonly known as: COREG Take 0.5 tablets (12.5 mg total) by mouth 2 (two) times daily with a meal. What changed:  how much to take when to take this   dapagliflozin propanediol 10 MG Tabs tablet Commonly known as: Farxiga Take 1 tablet (10 mg total) by mouth daily before breakfast.   doxazosin 4 MG tablet Commonly known as: Cardura Take 1 tablet (4 mg total) by mouth 2 (two) times daily. What changed: Another medication with the same name was removed. Continue taking this  medication, and follow the directions you see here.   ferrous sulfate 325 (65 FE) MG tablet Take 1 tablet (325 mg total) by mouth 2 (two) times daily with a meal.   fish oil-omega-3 fatty acids 1000 MG capsule Take 1,200 mg by mouth daily.   FreeStyle Lite Devi Use to check blood sugar once daily   FREESTYLE LITE test strip Generic drug: glucose blood USE TO CHECK BLOOD SUGAR 1-2x DAILY. E11.59   multivitamin with minerals Tabs tablet Take 1 tablet by mouth daily.   pantoprazole 40 MG tablet Commonly known as: PROTONIX Take 1 tablet (40 mg total) by mouth daily. Start taking on: October 07, 2021 Replaces: omeprazole 20 MG capsule   PRESCRIPTION MEDICATION Uses C-PAP at bedtime   rosuvastatin 40 MG tablet Commonly known as: CRESTOR Take 1 tablet (40 mg total) by mouth daily.   valsartan 320 MG tablet Commonly known as: DIOVAN TAKE 1 TABLET BY MOUTH EVERY DAY   Vitamin D-3 25 MCG (1000 UT) Caps Take by mouth daily. 2 capsules.        Follow-up Information     Loel Dubonnet, NP Follow up on 10/16/2021.   Specialty: Cardiology Why: at 8:50 AM  --Dr. Blenda Mounts Nurse Practitioner Contact information: [place] [address] [phone]                Allergies  Allergen Reactions   Atorvastatin     myalgias    Consultations: cardiolgoy   Procedures/Studies: ECHOCARDIOGRAM COMPLETE  Result Date: 10/06/2021    ECHOCARDIOGRAM REPORT   Patient Name:   KEVAUN BONEWITZ Date of Exam: 10/06/2021 Medical Rec #:  [id number]    Height:       67.0 in Accession #:    [id number]   Weight:       208.0 lb Date of Birth:  May 24, 1945   BSA:          2.056 m Patient Age:    29 years     BP:           111/52 mmHg Patient Gender: M            HR:           60 bpm. Exam Location:  Inpatient Procedure: 2D Echo, Cardiac Doppler and Color Doppler Indications:    Chest Pain  History:        Patient has prior history of Echocardiogram examinations, most                 recent 05/26/2021. CHF, CAD; Risk Factors:Dyslipidemia and                 Diabetes.  Sonographer:    Memory Argue Referring Phys: [id number] [place]  1. Left ventricular ejection fraction, by estimation, is 60 to 65%. The left ventricle has normal function. The left ventricle has no regional wall motion abnormalities. There is mild concentric left ventricular hypertrophy. Left ventricular diastolic parameters are consistent with Grade I diastolic dysfunction (impaired relaxation).  2. Right ventricular systolic function is normal. The right ventricular size is normal. There is  mildly elevated pulmonary artery systolic pressure. The estimated right ventricular systolic pressure is [id number] mmHg.  3. Left atrial size was mildly dilated.  4. The mitral valve is normal in structure. Trivial mitral valve regurgitation. No evidence of mitral stenosis.  5. The aortic valve was not well visualized. There is mild calcification of the aortic valve. There is mild thickening  of the aortic valve. Aortic valve regurgitation is not visualized. Mild aortic valve stenosis. Aortic valve mean gradient measures 14.0 mmHg. Normal LV stroke volume index DVI 0.56.  6. The inferior vena cava is dilated in size with <50% respiratory variability, suggesting right atrial pressure of 15 mmHg. Comparison(s): No significant change from prior study. FINDINGS  Left Ventricle: Left ventricular ejection fraction, by estimation, is 60 to 65%. The left ventricle has normal function. The left ventricle has no regional wall motion abnormalities. The left ventricular internal cavity size was normal in size. There is  mild concentric left ventricular hypertrophy. Left ventricular diastolic parameters are consistent with Grade I diastolic dysfunction (impaired relaxation). Right Ventricle: The right ventricular size is normal. No increase in right ventricular wall thickness. Right ventricular systolic function is normal. There is mildly elevated pulmonary artery systolic pressure. The tricuspid regurgitant velocity is 2.44  m/s, and with an assumed right atrial pressure of 15 mmHg, the estimated right ventricular systolic pressure is 38.8 mmHg. Left Atrium: Left atrial size was mildly dilated. Right Atrium: Right atrial size was not well visualized. Pericardium: There is no evidence of pericardial effusion. Mitral Valve: The mitral valve is normal in structure. Trivial mitral valve regurgitation. No evidence of mitral valve stenosis. Tricuspid Valve: The tricuspid valve is normal in structure. Tricuspid valve regurgitation is not  demonstrated. No evidence of tricuspid stenosis. Aortic Valve: The aortic valve was not well visualized. There is mild calcification of the aortic valve. There is mild thickening of the aortic valve. There is mild aortic valve annular calcification. Aortic valve regurgitation is not visualized. Mild aortic stenosis is present. Aortic valve mean gradient measures 14.0 mmHg. Aortic valve peak gradient measures 25.8 mmHg. Aortic valve area, by VTI measures 1.95 cm. Pulmonic Valve: The pulmonic valve was normal in structure. Pulmonic valve regurgitation is not visualized. No evidence of pulmonic stenosis. Aorta: The aortic root is normal in size and structure. Venous: The inferior vena cava is dilated in size with less than 50% respiratory variability, suggesting right atrial pressure of 15 mmHg. IAS/Shunts: No atrial level shunt detected by color flow Doppler.  LEFT VENTRICLE PLAX 2D LVIDd:         4.40 cm   Diastology LVIDs:         3.00 cm   LV e' medial:    5.33 cm/s LV PW:         1.30 cm   LV E/e' medial:  16.0 LV IVS:        1.20 cm   LV e' lateral:   7.94 cm/s LVOT diam:     2.10 cm   LV E/e' lateral: 10.7 LV SV:         111 LV SV Index:   54 LVOT Area:     3.46 cm  RIGHT VENTRICLE RV S prime:     13.10 cm/s TAPSE (M-mode): 2.2 cm LEFT ATRIUM             Index        RIGHT ATRIUM           Index LA diam:        3.30 cm 1.60 cm/m   RA Area:     18.60 cm LA Vol (A2C):   81.8 ml 39.81 ml/m  RA Volume:   48.90 ml  23.78 ml/m LA Vol (A4C):   64.8 ml 31.51 ml/m LA Biplane Vol: 70.1 ml 34.09 ml/m  AORTIC VALVE AV Area (Vmax):  1.73 cm AV Area (Vmean):   1.66 cm AV Area (VTI):     1.95 cm AV Vmax:           254.00 cm/s AV Vmean:          167.500 cm/s AV VTI:            0.567 m AV Peak Grad:      25.8 mmHg AV Mean Grad:      14.0 mmHg LVOT Vmax:         127.00 cm/s LVOT Vmean:        80.500 cm/s LVOT VTI:          0.320 m LVOT/AV VTI ratio: 0.56  AORTA Ao Root diam: 3.20 cm MITRAL VALVE                TRICUSPID VALVE MV Area (PHT): 3.85 cm    TR Peak grad:   23.8 mmHg MV Decel Time: 197 msec    TR Vmax:        244.00 cm/s MV E velocity: 85.20 cm/s MV A velocity: 95.40 cm/s  SHUNTS MV E/A ratio:  0.89        Systemic VTI:  0.32 m                            Systemic Diam: 2.10 cm Rudean Haskell MD Electronically signed by Rudean Haskell MD Signature Date/Time: 10/06/2021/11:01:23 AM    Final    DG Chest 2 View  Result Date: 10/05/2021 CLINICAL DATA:  Chest pain. EXAM: CHEST - 2 VIEW COMPARISON:  Jun 11, 2021. FINDINGS: The heart size and mediastinal contours are within normal limits. Minimal bibasilar subsegmental atelectasis is noted. The visualized skeletal structures are unremarkable. IMPRESSION: Minimal bibasilar subsegmental atelectasis. Electronically Signed   By: Marijo Conception M.D.   On: 10/05/2021 12:15   (Echo, Carotid, EGD, Colonoscopy, ERCP)    Subjective:  No nausea, no vomiting, no shortness of breath, he mains with some intermittent nontypical chest pain. Discharge Exam: Vitals:   10/06/21 0800 10/06/21 1153  BP: (!) 142/66 (!) 137/41  Pulse: (!) 58 (!) 57  Resp:    Temp: 97.9 F (36.6 C) 99.7 F (37.6 C)  SpO2: 97% 95%   Vitals:   10/06/21 0011 10/06/21 0400 10/06/21 0800 10/06/21 1153  BP: 135/72 (!) 111/52 (!) 142/66 (!) 137/41  Pulse: (!) 53 (!) 54 (!) 58 (!) 57  Resp: 14     Temp: (!) 97.5 F (36.4 C) (!) 97.5 F (36.4 C) 97.9 F (36.6 C) 99.7 F (37.6 C)  TempSrc: Axillary  Oral Oral  SpO2: 94% 94% 97% 95%  Weight:      Height:        General: Pt is alert, awake, not in acute distress Cardiovascular: RRR, S1/S2 +, no rubs, no gallops Respiratory: CTA bilaterally, no wheezing, no rhonchi Abdominal: Soft, NT, ND, bowel sounds + Extremities: no edema, no cyanosis    The results of significant diagnostics from this hospitalization (including imaging, microbiology, ancillary and laboratory) are listed below for reference.      Microbiology: No results found for this or any previous visit (from the past 240 hour(s)).   Labs: BNP (last 3 results) Recent Labs    10/05/21 1155  BNP [id number]   Basic Metabolic Panel: Recent Labs  Lab 10/05/21 1155 10/05/21 1612 10/05/21 2348  NA 143  --  140  K 3.6  --  3.2*  CL 111  --  108  CO2 23  --  23  GLUCOSE 159*  --  184*  BUN 16  --  14  CREATININE 1.80*  --  1.78*  CALCIUM 9.0  --  8.9  MG  --  1.7 1.5*   Liver Function Tests: Recent Labs  Lab 10/05/21 2348  AST 28  ALT 27  ALKPHOS 78  BILITOT 0.3  PROT 5.9*  ALBUMIN 3.4*   Recent Labs  Lab 10/05/21 2348  LIPASE 47   No results for input(s): "AMMONIA" in the last 168 hours. CBC: Recent Labs  Lab 10/05/21 1155 10/05/21 2348  WBC 6.6 5.5  NEUTROABS  --  3.6  HGB 12.3* 11.9*  HCT 37.4* 35.5*  MCV 83.9 82.4  PLT 204 188   Cardiac Enzymes: No results for input(s): "CKTOTAL", "CKMB", "CKMBINDEX", "TROPONINI" in the last 168 hours. BNP: Invalid input(s): "POCBNP" CBG: Recent Labs  Lab 10/06/21 0900 10/06/21 1155  GLUCAP 110* 154*   D-Dimer No results for input(s): "DDIMER" in the last 72 hours. Hgb A1c No results for input(s): "HGBA1C" in the last 72 hours. Lipid Profile No results for input(s): "CHOL", "HDL", "LDLCALC", "TRIG", "CHOLHDL", "LDLDIRECT" in the last 72 hours. Thyroid function studies No results for input(s): "TSH", "T4TOTAL", "T3FREE", "THYROIDAB" in the last 72 hours.  Invalid input(s): "FREET3" Anemia work up No results for input(s): "VITAMINB12", "FOLATE", "FERRITIN", "TIBC", "IRON", "RETICCTPCT" in the last 72 hours. Urinalysis    Component Value Date/Time   COLORURINE YELLOW 04/30/2020 1647   APPEARANCEUR CLEAR 04/30/2020 1647   LABSPEC 1.015 04/30/2020 1647   PHURINE 5.5 04/30/2020 1647   GLUCOSEU 2+ (A) 04/30/2020 1647   HGBUR TRACE (A) 04/30/2020 1647   BILIRUBINUR NEGATIVE 10/05/2017 1201   KETONESUR NEGATIVE 04/30/2020 1647   PROTEINUR 3+ (A)  04/30/2020 1647   UROBILINOGEN 0.2 02/13/2013 1410   NITRITE NEGATIVE 04/30/2020 1647   LEUKOCYTESUR NEGATIVE 04/30/2020 1647   Sepsis Labs Recent Labs  Lab 10/05/21 1155 10/05/21 2348  WBC 6.6 5.5   Microbiology No results found for this or any previous visit (from the past 240 hour(s)).   Time coordinating discharge: Over 30 minutes  SIGNED:   Phillips Climes, MD  [facility] 10/06/2021, 2:22 PM Pager   If 7PM-7AM, please contact night-coverage [email or web address]

## 2021-10-06 NOTE — Discharge Instructions (Signed)
Follow with Primary MD Donita Brooks, MD   Get CBC, CMP,  checked  by Primary MD next visit.    Activity: As tolerated with Full fall precautions use walker/cane & assistance as needed   Disposition Home    Diet: Heart Healthy   On your next visit with your primary care physician please Get Medicines reviewed and adjusted.   Please request your Prim.MD to go over all Hospital Tests and Procedure/Radiological results at the follow up, please get all Hospital records sent to your Prim MD by signing hospital release before you go home.   If you experience worsening of your admission symptoms, develop shortness of breath, life threatening emergency, suicidal or homicidal thoughts you must seek medical attention immediately by calling 911 or calling your MD immediately  if symptoms less severe.  You Must read complete instructions/literature along with all the possible adverse reactions/side effects for all the Medicines you take and that have been prescribed to you. Take any new Medicines after you have completely understood and accpet all the possible adverse reactions/side effects.   Do not drive, operating heavy machinery, perform activities at heights, swimming or participation in water activities or provide baby sitting services if your were admitted for syncope or siezures until you have seen by Primary MD or a Neurologist and advised to do so again.  Do not drive when taking Pain medications.    Do not take more than prescribed Pain, Sleep and Anxiety Medications  Special Instructions: If you have smoked or chewed Tobacco  in the last 2 yrs please stop smoking, stop any regular Alcohol  and or any Recreational drug use.  Wear Seat belts while driving.   Please note  You were cared for by a hospitalist during your hospital stay. If you have any questions about your discharge medications or the care you received while you were in the hospital after you are discharged, you  can call the unit and asked to speak with the hospitalist on call if the hospitalist that took care of you is not available. Once you are discharged, your primary care physician will handle any further medical issues. Please note that NO REFILLS for any discharge medications will be authorized once you are discharged, as it is imperative that you return to your primary care physician (or establish a relationship with a primary care physician if you do not have one) for your aftercare needs so that they can reassess your need for medications and monitor your lab values.

## 2021-10-06 NOTE — Consult Note (Addendum)
Cardiology Consultation   Patient ID: James Jones MRN: [id number]; DOB: 1945/08/18  Admit date: 10/05/2021 Date of Consult: 10/06/2021  PCP:  Donita Brooks, MD   [facility] Providers Cardiologist:  Chilton Si, MD        Patient Profile:   James Jones is a 76 y.o. male with a hx of  CKD 3, non obstructive CAD, DM2, GERD, HLD, HTN, OSA on CPAP, and AS who is being seen 10/06/2021 for the evaluation of chest pain at the request of Dr. Randol Kern.  History of Present Illness:   Mr. Matczak with above hx and last cardiac cath 2014 with LM 10-20% stenosis, and LAD with up to 40% stenosis, LCX has minimal irregularities, RCA with 60-70% focal/napkin ring  The result was FFR of 0.84 which is not physiologically significant.  Has done well since cath with last echo 05/2021 with EF 60-65%, no RWMA, mild LVH, G2DD, Rt and Lt atrium mildly dilated.  RV is mildly enlarged. Severely elevated PA systolic pressure, RV systolic pressure 60.4 mmhg Mild aortic stenosis is present. Aortic valve mean gradient   measures 9.0 mmHg. Aortic valve peak gradient measures 19.9 mmHg. Aortic valve area, by VTI measures 1.93 cm.    Pt presented to ER 10/05/21 with chest pain.  He had 4 days of intermittent sharp Lt sided chest pain pain , not reproducible, non pleuritic.  No SOB nausea, vomiting diaphoresis.  He has risk factors for CAD with DM, HTN, HLD   he has despite the pain continue to do 6 miles on his elliptical.   The pain comes with no rhyme or reason.    In ER GI cocktail with viscous lidocaine, Toradol 15 mg IM x1, Norco 5/325 mg p.o. x1, and sublingual nitroglycerin x1 dose and ASA.  EKG:  The EKG was personally reviewed and demonstrates:  SB at 45 with RBBB + PVC and no acute ST changes.  RBBB is old  today SB at 24 and RBBB no acute changes Telemetry:  Telemetry was personally reviewed and demonstrates:  SB with occ PVC  Echo without much change.   BNP 63.7, HS troponin  13,17,14,17 neg MI  Na 140, K+ 3.2 BUN 14, Cr 1.78 Mg+ 1.5   GFR 39 Hgb 11.9, WBC 5.5 plts 188  2V CXR  Minimal bibasilar subsegmental atelectasis.    BP 142/66 P 50s mostly afebrile R 14  Past Medical History:  Diagnosis Date   Aortic stenosis 04/02/2021   Chronic diastolic heart failure (HCC)    CKD (chronic kidney disease) stage 2, GFR 60-89 ml/min 03/17/2012   Coronary artery disease, non-occlusive 03/18/2012   60-70% stenosis in RCA -- FFR 0.84; EF 60-65%   Diabetes mellitus without complication (HCC)    GERD (gastroesophageal reflux disease)    On PPI   History of GI bleed 02/2013   No obvious findings on EGD/colonoscopy   Hydrocele, bilateral    large on [place] 2020   Hyperlipidemia    Hypertension    Obesity (BMI 30.0-34.9) 10/02/2012   OSA (obstructive sleep apnea) 03/18/2012   Doing better with CPAP   Pulmonary hypertension (HCC) 12/10/2010   ECHO:  Mild PH,mild LVH; PA pressures estimated 30-40 mmHg   Pulmonary hypertension, unspecified (HCC) 06/04/2021   RBBB, 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   Event monitor; usually during sleeping hours; therefore not on beta blocker    Past Surgical History:  Procedure Laterality Date   APPENDECTOMY  1974   CARPAL TUNNEL  RELEASE     EYE SURGERY  1960   Left eye   HEMORRHOID SURGERY     LEFT HEART CATH AND CORONARY ANGIOGRAPHY  03/27/2010   Moderate mid RCA lesion,right radial approach,normal EF   LEFT HEART CATHETERIZATION WITH CORONARY ANGIOGRAM N/A 03/17/2012   Procedure: LEFT HEART CATHETERIZATION WITH CORONARY ANGIOGRAM;  Surgeon: Leonie Man, MD;  Location: [facility] CATH LAB;  Service: Cardiovascular: Fractional Flow Reserve Measurement of mid RCA 60-70% stenosis = 0.84. Otherwise mild LCA CAD.  Normal EF & EDP   SHOULDER ARTHROSCOPY  2006   TRANSTHORACIC ECHOCARDIOGRAM  12/2010    Mild concentric LVH.  EF> 55%.  GR 1 DD.  Mild aortic sclerosis no stenosis.  PA pressures estimated 30-40 mmHg     Home  Medications:  Prior to Admission medications   Medication Sig Start Date End Date Taking? Authorizing Provider  allopurinol (ZYLOPRIM) 100 MG tablet TAKE 1 TABLET BY MOUTH EVERY DAY 05/07/21  Yes Susy Frizzle, MD  amLODipine (NORVASC) 10 MG tablet Take 1 tablet (10 mg total) by mouth daily. 01/28/21  Yes Susy Frizzle, MD  carvedilol (COREG) 25 MG tablet Take 1 tablet (25 mg total) by mouth 2 (two) times daily. 09/10/21  Yes Susy Frizzle, MD  Cholecalciferol (VITAMIN D-3) 1000 UNITS CAPS Take by mouth daily. 2 capsules.   Yes [provider]  dapagliflozin propanediol (FARXIGA) 10 MG TABS tablet Take 1 tablet (10 mg total) by mouth daily before breakfast. 03/17/21  Yes Philemon Kingdom, MD  doxazosin (CARDURA) 2 MG tablet Take 1 tablet (2 mg total) by mouth 2 (two) times daily. TO BE TAKEN WITH 4 MG TABLET FOR TOTAL OF 6 MG  TWICE A DAY 06/04/21  Yes Skeet Latch, MD  doxazosin (CARDURA) 4 MG tablet Take 1 tablet (4 mg total) by mouth 2 (two) times daily. 04/02/21  Yes Skeet Latch, MD  ferrous sulfate 325 (65 FE) MG tablet Take 1 tablet (325 mg total) by mouth 2 (two) times daily with a meal. 06/22/19  Yes Susy Frizzle, MD  fish oil-omega-3 fatty acids 1000 MG capsule Take 1,200 mg by mouth daily.    Yes [provider]  Multiple Vitamin (MULTIVITAMIN WITH MINERALS) TABS Take 1 tablet by mouth daily.   Yes [provider]  omeprazole (PRILOSEC) 20 MG capsule Take 1 capsule (20 mg total) by mouth daily. 07/23/21  Yes Susy Frizzle, MD  PRESCRIPTION MEDICATION Uses C-PAP at bedtime   Yes [provider]  rosuvastatin (CRESTOR) 40 MG tablet Take 1 tablet (40 mg total) by mouth daily. 09/25/20  Yes Skeet Latch, MD  valsartan (DIOVAN) 320 MG tablet TAKE 1 TABLET BY MOUTH EVERY DAY 04/28/21  Yes Susy Frizzle, MD  albuterol (VENTOLIN HFA) 108 (90 Base) MCG/ACT inhaler Inhale 2 puffs into the lungs every 6 (six) hours as needed for  wheezing or shortness of breath. Patient not taking: Reported on 10/05/2021 08/27/21   Maryjane Hurter, MD  Blood Glucose Monitoring Suppl (FREESTYLE LITE) DEVI Use to check blood sugar once daily 11/18/17   Philemon Kingdom, MD  glucose blood (FREESTYLE LITE) test strip USE TO CHECK BLOOD SUGAR 1-2x DAILY. E11.59 09/08/21   Philemon Kingdom, MD  umeclidinium-vilanterol (ANORO ELLIPTA) 62.5-25 MCG/ACT AEPB Inhale 1 puff into the lungs daily. Patient not taking: Reported on 10/05/2021 08/27/21   Maryjane Hurter, MD    Inpatient Medications: Scheduled Meds:  doxazosin  6 mg Oral Q12H   insulin aspart  0-9 Units Subcutaneous TID WC   irbesartan  300 mg Oral Daily   pantoprazole  40 mg Oral Daily   rosuvastatin  40 mg Oral Daily   Continuous Infusions:  PRN Meds: acetaminophen **OR** acetaminophen, nitroGLYCERIN  Allergies:    Allergies  Allergen Reactions   Atorvastatin     myalgias    Social History:   Social History   Socioeconomic History   Marital status: Married    Spouse name: Not on file   Number of children: Not on file   Years of education: Not on file   Highest education level: Not on file  Occupational History   Not on file  Tobacco Use   Smoking status: Former    Packs/day: 2.00    Years: 50.00    Total pack years: 100.00    Types: Cigarettes    Quit date: 10/09/2020    Years since quitting: 0.9   Smokeless tobacco: Never  Vaping Use   Vaping Use: Never used  Substance and Sexual Activity   Alcohol use: Yes    Alcohol/week: 1.0 standard drink of alcohol    Types: 1 Standard drinks or equivalent per week   Drug use: Yes    Types: Marijuana    Comment: cannibus, occ use   Sexual activity: Not on file  Other Topics Concern   Not on file  Social History Narrative   Married, father of 2, grandfather of 3.   He is a former smoker of about a pack to pack and half cigarettes a day -- he quit in March of this year.    He is an avid exerciser working at  least 4-5 days a week doing her walking or stationary bike.   Social Determinants of Health   Financial Resource Strain: Medium Risk (04/23/2021)   Overall Financial Resource Strain (CARDIA)    Difficulty of Paying Living Expenses: Somewhat hard  Food Insecurity: No Food Insecurity (04/23/2021)   Hunger Vital Sign    Worried About Running Out of Food in the Last Year: Never true    Ran Out of Food in the Last Year: Never true  Transportation Needs: No Transportation Needs (04/23/2021)   PRAPARE - Hydrologist (Medical): No    Lack of Transportation (Non-Medical): No  Physical Activity: Sufficiently Active (04/23/2021)   Exercise Vital Sign    Days of Exercise per Week: 3 days    Minutes of Exercise per Session: 60 min  Stress: No Stress Concern Present (04/23/2021)   [place]    Feeling of Stress : Only a little  Social Connections: Socially Integrated (04/23/2021)   Social Connection and Isolation Panel [NHANES]    Frequency of Communication with Friends and Family: More than three times a week    Frequency of Social Gatherings with Friends and Family: More than three times a week    Attends Religious Services: More than 4 times per year    Active Member of [employer] or Organizations: Yes    Attends Archivist Meetings: More than 4 times per year    Marital Status: Married  Human resources officer Violence: Not At Risk (04/23/2021)   Humiliation, Afraid, Rape, and Kick questionnaire    Fear of Current or Ex-Partner: No    Emotionally Abused: No    Physically Abused: No    Sexually Abused: No    Family History:    Family History  Problem Relation Age  of Onset   Hypertension Mother    Kidney failure Mother    Coronary artery disease Father    Hypertension Sister    Kidney failure Sister    Stroke Maternal Grandmother      ROS:  Please see the history of present illness.   General:no colds or fevers, no weight changes Skin:no rashes or ulcers HEENT:no blurred vision, no congestion CV:see HPI PUL:see HPI GI:no diarrhea constipation or melena, no indigestion GU:no hematuria, no dysuria MS:no joint pain, no claudication Neuro:no syncope, no lightheadedness Endo:+ diabetes, no thyroid disease  All other ROS reviewed and negative.     Physical Exam/Data:   Vitals:   10/05/21 2359 10/06/21 0011 10/06/21 0400 10/06/21 0800  BP:  135/72 (!) 111/52 (!) 142/66  Pulse: (!) 54 (!) 53 (!) 54 (!) 58  Resp: 17 14    Temp:  (!) 97.5 F (36.4 C) (!) 97.5 F (36.4 C) 97.9 F (36.6 C)  TempSrc:  Axillary    SpO2: 94% 94% 94% 97%  Weight:      Height:        Intake/Output Summary (Last 24 hours) at 10/06/2021 1051 Last data filed at 10/06/2021 0700 Gross per 24 hour  Intake --  Output 900 ml  Net -900 ml      10/05/2021   11:49 AM 09/08/2021    8:59 AM 08/27/2021    2:34 PM  Last 3 Weights  Weight (lbs) 208 lb 208 lb 3.2 oz 207 lb  Weight (kg) 94.348 kg 94.439 kg 93.895 kg     Body mass index is 32.58 kg/m.  General:  Well nourished, well developed, in no acute distress though still with pain on occ HEENT: normal Neck: + JVD Vascular: + carotid bruits; Distal pulses 2+ bilaterally Cardiac:  normal S1, S2; RRR; no murmur gallup rub or click Lungs:  clear to auscultation bilaterally, no wheezing, rhonchi or rales  Abd: soft, nontender, no hepatomegaly  Ext: no edema Musculoskeletal:  No deformities, BUE and BLE strength normal and equal Skin: warm and dry  Neuro:  CNs 2-12 intact, no focal abnormalities noted Psych:  Normal affect    Relevant CV Studies:  TTE 10/06/21 IMPRESSIONS     1. Left ventricular ejection fraction, by estimation, is 60 to 65%. The  left ventricle has normal function. The left ventricle has no regional  wall motion abnormalities. There is mild concentric left ventricular  hypertrophy. Left ventricular diastolic   parameters are consistent with Grade I diastolic dysfunction (impaired  relaxation).   2. Right ventricular systolic function is normal. The right ventricular  size is normal. There is mildly elevated pulmonary artery systolic  pressure. The estimated right ventricular systolic pressure is 38.8 mmHg.   3. Left atrial size was mildly dilated.   4. The mitral valve is normal in structure. Trivial mitral valve  regurgitation. No evidence of mitral stenosis.   5. The aortic valve was not well visualized. There is mild calcification  of the aortic valve. There is mild thickening of the aortic valve. Aortic  valve regurgitation is not visualized. Mild aortic valve stenosis. Aortic  valve mean gradient measures  14.0 mmHg. Normal LV stroke volume index DVI 0.56.   6. The inferior vena cava is dilated in size with <50% respiratory  variability, suggesting right atrial pressure of 15 mmHg.   Comparison(s): No significant change from prior study.   FINDINGS   Left Ventricle: Left ventricular ejection fraction, by estimation, is 60  to  65%. The left ventricle has normal function. The left ventricle has no  regional wall motion abnormalities. The left ventricular internal cavity  size was normal in size. There is   mild concentric left ventricular hypertrophy. Left ventricular diastolic  parameters are consistent with Grade I diastolic dysfunction (impaired  relaxation).   Right Ventricle: The right ventricular size is normal. No increase in  right ventricular wall thickness. Right ventricular systolic function is  normal. There is mildly elevated pulmonary artery systolic pressure. The  tricuspid regurgitant velocity is 2.44   m/s, and with an assumed right atrial pressure of 15 mmHg, the estimated  right ventricular systolic pressure is [id number] mmHg.   Left Atrium: Left atrial size was mildly dilated.   Right Atrium: Right atrial size was not well visualized.   Pericardium: There is no  evidence of pericardial effusion.   Mitral Valve: The mitral valve is normal in structure. Trivial mitral  valve regurgitation. No evidence of mitral valve stenosis.   Tricuspid Valve: The tricuspid valve is normal in structure. Tricuspid  valve regurgitation is not demonstrated. No evidence of tricuspid  stenosis.   Aortic Valve: The aortic valve was not well visualized. There is mild  calcification of the aortic valve. There is mild thickening of the aortic  valve. There is mild aortic valve annular calcification. Aortic valve  regurgitation is not visualized. Mild  aortic stenosis is present. Aortic valve mean gradient measures 14.0 mmHg.  Aortic valve peak gradient measures 25.8 mmHg. Aortic valve area, by VTI  measures 1.95 cm.   Pulmonic Valve: The pulmonic valve was normal in structure. Pulmonic valve  regurgitation is not visualized. No evidence of pulmonic stenosis.   Aorta: The aortic root is normal in size and structure.   Venous: The inferior vena cava is dilated in size with less than 50%  respiratory variability, suggesting right atrial pressure of 15 mmHg.   IAS/Shunts: No atrial level shunt detected by color flow Doppler.   TTE 05/26/21   1. Left ventricular ejection fraction, by estimation, is 60 to 65%. Left  ventricular ejection fraction by 3D volume is 61 %. The left ventricle has  normal function. The left ventricle has no regional wall motion  abnormalities. There is mild left  ventricular hypertrophy. Left ventricular diastolic parameters are  consistent with Grade II diastolic dysfunction (pseudonormalization).  Elevated left atrial pressure.   2. Right ventricular systolic function is normal. The right ventricular  size is mildly enlarged. There is severely elevated pulmonary artery  systolic pressure. The estimated right ventricular systolic pressure is  [id number] mmHg.   3. Left atrial size was mildly dilated.   4. Right atrial size was mildly dilated.    5. The mitral valve is normal in structure. Trivial mitral valve  regurgitation. No evidence of mitral stenosis.   6. The inferior vena cava is dilated in size with <50% respiratory  variability, suggesting right atrial pressure of 15 mmHg.   7. The aortic valve is calcified. There is moderate calcification of the  aortic valve. Aortic valve regurgitation is not visualized. Mild aortic  valve stenosis. Vmax 2.2 m/s, MG 48mmHg, AVA 1.9 cm^2, DI 0.47   Comparison(s): EF 60%, AS mean 14.2 mmHg, peak 25.8 mmHg, PASP 38mmHg.   FINDINGS   Left Ventricle: Left ventricular ejection fraction, by estimation, is 60  to 65%. Left ventricular ejection fraction by 3D volume is 61 %. The left  ventricle has normal function. The left ventricle has no regional wall  motion abnormalities. The left  ventricular internal cavity size was normal in size. There is mild left  ventricular hypertrophy. Left ventricular diastolic parameters are  consistent with Grade II diastolic dysfunction (pseudonormalization).  Elevated left atrial pressure.   Right Ventricle: The right ventricular size is mildly enlarged. No  increase in right ventricular wall thickness. Right ventricular systolic  function is normal. There is severely elevated pulmonary artery systolic  pressure. The tricuspid regurgitant  velocity is 3.37 m/s, and with an assumed right atrial pressure of 15  mmHg, the estimated right ventricular systolic pressure is [id number] mmHg.   Left Atrium: Left atrial size was mildly dilated.   Right Atrium: Right atrial size was mildly dilated.   Pericardium: There is no evidence of pericardial effusion.   Mitral Valve: The mitral valve is normal in structure. Trivial mitral  valve regurgitation. No evidence of mitral valve stenosis.   Tricuspid Valve: The tricuspid valve is normal in structure. Tricuspid  valve regurgitation is trivial.   Aortic Valve: The aortic valve is calcified. There is moderate   calcification of the aortic valve. Aortic valve regurgitation is not  visualized. Mild aortic stenosis is present. Aortic valve mean gradient  measures 9.0 mmHg. Aortic valve peak gradient  measures 19.9 mmHg. Aortic valve area, by VTI measures 1.93 cm.   Pulmonic Valve: The pulmonic valve was not well visualized. Pulmonic valve  regurgitation is trivial.   Aorta: The aortic root and ascending aorta are structurally normal, with  no evidence of dilitation.   Venous: The inferior vena cava is dilated in size with less than 50%  respiratory variability, suggesting right atrial pressure of 15 mmHg.   IAS/Shunts: The interatrial septum was not well visualized.   05/2021 Carotid duplex IMPRESSION: Minor carotid atherosclerosis. Negative for significant stenosis. Degree of narrowing less than 50% bilaterally by ultrasound criteria.   Patent antegrade vertebral flow bilaterally   Findings suspicious for abnormal supraclavicular adenopathy. Consider follow-up imaging with neck CT with contrast.   Renal artery duplex 10/02/2020  Right: No evidence of right renal artery stenosis. RRV flow present.         Cyst(s) noted. Normal size right kidney. Abnormal right         Resistive Index. Normal cortical thickness of right kidney.  Left:  No evidence of left renal artery stenosis. LRV flow present.         Cyst(s) noted. Normal size of left kidney. Abnormal left         Resisitve Index. Normal cortical thickness of the left         kidney.  Mesenteric:  Normal Celiac artery and Superior Mesenteric artery findings. Areas of  limited  visceral study include left renal artery due to dense bowel gasocket in  this  area.   Echo 10/09/2020  1. Left ventricular ejection fraction, by estimation, is 60 to 65%. The  left ventricle has normal function. The left ventricle has no regional  wall motion abnormalities. Left ventricular diastolic parameters were  normal.   2. Right ventricular  systolic function is normal. The right ventricular  size is normal. There is moderately elevated pulmonary artery systolic  pressure. The estimated right ventricular systolic pressure is 0000000 mmHg.   3. The mitral valve is normal in structure. Mild mitral valve  regurgitation. No evidence of mitral stenosis.   4. The aortic valve is tricuspid. There is moderate calcification of the  aortic valve. There is moderate thickening of the aortic valve. Aortic  valve regurgitation is not visualized. Mild to moderate aortic valve  stenosis. Aortic valve area, by VTI  measures 1.39 cm. Aortic valve mean gradient measures 14.2 mmHg. Aortic  valve Vmax measures 2.54 m/s.   5. The inferior vena cava is normal in size with greater than 50%  respiratory variability, suggesting right atrial pressure of 3 mmHg.   Laboratory Data:  High Sensitivity Troponin:   Recent Labs  Lab 10/05/21 1155 10/05/21 1612 10/05/21 2348 10/06/21 0501  TROPONINIHS 13 17 14 17      Chemistry Recent Labs  Lab 10/05/21 1155 10/05/21 1612 10/05/21 2348  NA 143  --  140  K 3.6  --  3.2*  CL 111  --  108  CO2 23  --  23  GLUCOSE 159*  --  184*  BUN 16  --  14  CREATININE 1.80*  --  1.78*  CALCIUM 9.0  --  8.9  MG  --  1.7 1.5*  GFRNONAA 38*  --  39*  ANIONGAP 9  --  9    Recent Labs  Lab 10/05/21 2348  PROT 5.9*  ALBUMIN 3.4*  AST 28  ALT 27  ALKPHOS 78  BILITOT 0.3   Lipids No results for input(s): "CHOL", "TRIG", "HDL", "LABVLDL", "LDLCALC", "CHOLHDL" in the last 168 hours.  Hematology Recent Labs  Lab 10/05/21 1155 10/05/21 2348  WBC 6.6 5.5  RBC 4.46 4.31  HGB 12.3* 11.9*  HCT 37.4* 35.5*  MCV 83.9 82.4  MCH 27.6 27.6  MCHC 32.9 33.5  RDW 14.1 13.9  PLT 204 188   Thyroid No results for input(s): "TSH", "FREET4" in the last 168 hours.  BNP Recent Labs  Lab 10/05/21 1155  BNP 63.7    DDimer No results for input(s): "DDIMER" in the last 168 hours.   Radiology/Studies:  DG Chest 2  View  Result Date: 10/05/2021 CLINICAL DATA:  Chest pain. EXAM: CHEST - 2 VIEW COMPARISON:  Jun 11, 2021. FINDINGS: The heart size and mediastinal contours are within normal limits. Minimal bibasilar subsegmental atelectasis is noted. The visualized skeletal structures are unremarkable. IMPRESSION: Minimal bibasilar subsegmental atelectasis. Electronically Signed   By: Marijo Conception M.D.   On: 10/05/2021 12:15     Assessment and Plan:   Chest pain with neg MI, no acute EKG changes, known CAD with RCA stenosis in 2014.  Also with known DM, HTN, HLD and FH CAD.  Atypical and exercise has not increased pain or episodes.   Would consider nuc study vs cath Dr. Acie Fredrickson to see CAD last cath 2014 with CAD. HLD continue statin HTN on cardura and avapro Hypo mag 1.5 today  Pulmonary HTN    Risk Assessment/Risk Scores:     HEAR Score (for undifferentiated chest pain):             For questions or updates, please contact [place] Please consult [email or web address] for contact info under    Signed, Cecilie Kicks, NP  10/06/2021 10:51 AM   Attending Note:   The patient was seen and examined.  Agree with assessment and plan as noted above.  Changes made to the above note as needed.  Patient seen and independently examined with Cecilie Kicks, PA .   We discussed all aspects of the encounter. I agree with the assessment and plan as stated above.   1.  Atypical chest pain: Hassaan presents to the hospital with atypical chest pain.  These are very brief stabbing chest pains.  These  have been going on for the past 6 or 7 days.  The pains are not associated with a deep breath.  Not associated with eating or drinking.  Not associated with lying down or sitting forward.  They are not exertional.  In fact he worked out at his gym yesterday and did his usual elliptical workout.  Troponin levels here in the hospital were negative x3.  He does have history of mild coronary artery disease by heart  catheterization in 2014.  He has baseline chronic kidney disease with a creatinine of around 1.8.  I like to avoid contrast study if possible.  We will have him follow-up with Dr. Duke Salvia in the office.  We can consider doing a stress Myoview study at the office if she feels that the pain warrants further work-up.  2.  Pulmonary hypertension: Pulmonary pressures by today's echo are slightly improved.  He still has mild to moderate pulmonary hypertension.  He is not short of breath at rest.   I have spent a total of 40 minutes with patient reviewing hospital  notes , telemetry, EKGs, labs and examining patient as well as establishing an assessment and plan that was discussed with the patient.  > 50% of time was spent in direct patient care.    Vesta Mixer, Montez Hageman., MD, [facility] 10/06/2021, 12:40 PM [address]  [address] Office [phone] Pager [phone]

## 2021-10-14 ENCOUNTER — Other Ambulatory Visit: Payer: Self-pay | Admitting: Family Medicine

## 2021-10-15 ENCOUNTER — Other Ambulatory Visit: Payer: Self-pay

## 2021-10-15 NOTE — Progress Notes (Signed)
Office Visit    Patient Name: James Jones Date of Encounter: 10/16/2021  PCP:  Donita Brooks, MD   [facility]  Cardiologist:  Chilton Si, MD  Advanced Practice Provider:  No care team member to display Electrophysiologist:  None      Chief Complaint    James Jones is a 76 y.o. male with a hx of CKD stage III, nonocclusive coronary artery disease, DM2, GERD, hyperlipidemia, hypertension, OSA on CPAP, aortic stenosis presents today for hospital follow-up  Past Medical History    Past Medical History:  Diagnosis Date   Aortic stenosis 04/02/2021   Chronic diastolic heart failure (HCC)    CKD (chronic kidney disease) stage 2, GFR 60-89 ml/min 03/17/2012   Coronary artery disease, non-occlusive 03/18/2012   60-70% stenosis in RCA -- FFR 0.84; EF 60-65%   Diabetes mellitus without complication (HCC)    GERD (gastroesophageal reflux disease)    On PPI   History of GI bleed 02/2013   No obvious findings on EGD/colonoscopy   Hydrocele, bilateral    large on [place] 2020   Hyperlipidemia    Hypertension    Obesity (BMI 30.0-34.9) 10/02/2012   OSA (obstructive sleep apnea) 03/18/2012   Doing better with CPAP   Pulmonary hypertension (HCC) 12/10/2010   ECHO:  Mild PH,mild LVH; PA pressures estimated 30-40 mmHg   Pulmonary hypertension, unspecified (HCC) 06/04/2021   RBBB, 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   Event monitor; usually during sleeping hours; therefore not on beta blocker   Past Surgical History:  Procedure Laterality Date   APPENDECTOMY  1974   CARPAL TUNNEL RELEASE     EYE SURGERY  1960   Left eye   HEMORRHOID SURGERY     LEFT HEART CATH AND CORONARY ANGIOGRAPHY  03/27/2010   Moderate mid RCA lesion,right radial approach,normal EF   LEFT HEART CATHETERIZATION WITH CORONARY ANGIOGRAM N/A 03/17/2012   Procedure: LEFT HEART CATHETERIZATION WITH CORONARY ANGIOGRAM;  Surgeon: Marykay Lex, MD;  Location: [facility] CATH LAB;   Service: Cardiovascular: Fractional Flow Reserve Measurement of mid RCA 60-70% stenosis = 0.84. Otherwise mild LCA CAD.  Normal EF & EDP   SHOULDER ARTHROSCOPY  2006   TRANSTHORACIC ECHOCARDIOGRAM  12/2010    Mild concentric LVH.  EF> 55%.  GR 1 DD.  Mild aortic sclerosis no stenosis.  PA pressures estimated 30-40 mmHg    Allergies  Allergies  Allergen Reactions   Atorvastatin     myalgias    History of Present Illness    James Jones is a 76 y.o. male with a hx of CKD stage III, nonocclusive coronary artery disease, DM2, GERD, hyperlipidemia, hypertension, OSA on CPAP, aortic stenosis.  He was last seen 08/04/21.  Remote cardiac catheterization February 2014 with 60 to 70% RCA stenosis.  Previous myalgias on atorvastatin but tolerates rosuvastatin.  He was seen by Dr. Okey Dupre of 09/25/2020.  He endorsed heartburn and trialed pantoprazole without much improvement. Grade 3 out of 6 murmur was noted on exam and echocardiogram ordered.  Blood pressure was poorly controlled and as such atenolol changed to carvedilol and doxazosin increased.  Amlodipine and valsartan were continued.  Renal artery duplex showed no renal artery stenosis bilaterally.  Echocardiogram 10/09/2020 with normal LVEF 60 to 65%, no R WMA, moderately elevated PASP, mild MR, mild to moderate aortic valve stenosis.   At follow up 11/2020 BP was well controlled. At follow up 04/2021 BP was elevated. Doxazosin increased to 4mg  BID.  At follow up 04/2021 BP was well controlled. Carotid duplex 05/2021 minor carotid atherosclerosis negative for significant stenosis. Did note possible abnormal supraclavicular adenopathy and PCP recommended CT. Echo 05/2021 LVEF 60-65%, mild LVH, gr2DD, RV normal function, RV mildly enlarged, severely elevated PASP, bilateral atria mildly dilated, trivial MR, RA pressure 57mmHg, mild aortic stenosis. At follow up 06/04/21 no heart failure symptoms but did notice exertional dyspnea. PFTs, VQ scan ordered. VQ scan with  no PE. CT 06/17/21 asymmetric left supraclavicular soft tissue which may reflect adjacent nonspecific nonenlarged lymph nodes. CT chest Lung-RADS2 recommended for follow up 12 mos.  PFTs were abnormal and referred to pulmonology.    Seen 08/04/21 feeling overall well - exercising three times per week at [facility] for 30 minutes. Due to pulmonary hypertension, exertional dyspnea - referred to pulmonology. He was started on Anoro by pulmonology 08/2021.   Admitted 8/28-8/29/23 after presenting with chest pain. Chest pain was atypical sharp, left sided with positional component. HS troponin negative x3.  Noted to be bradycardic and hypotensive while admitted. Carvedilol decreased by half, Amlodipine discontinued, Doxazosin decreased. Echo 2021/10/27 with EF 60-65%, mild LVH, gr1DD, mildly elevated PASP, LA mildly dilated, trivial MR, mild AS (mean gradient 1mmHg).   Presents today for hospital follow up regarding chest pain, bradycardia, and hypotension. He reports feeling well overall since his hospital admission. He denies any chest pain or pressure since hospital discharge. He denies any shortness of breath, dizziness, lightheadedness, or dyspnea. He does note to have right sided back pain that is intermittent. He reports this back pain does feel similar to the pain he experienced in his chest. He continues to go to the [facility] three times weekly for 1 hour at at time. He does not have to stop for dyspnea. He states he has been taken 6mg  doxazosin twice daily with no hypotension at home. He continues to take blood pressure at home, however is taking his blood pressure two hours prior to taking is medication. Home blood pressure measures from 125/69 to 158/73.   EKGs/Labs/Other Studies Reviewed:   The following studies were reviewed today: TTE 10-27-2021 IMPRESSIONS     1. Left ventricular ejection fraction, by estimation, is 60 to 65%. The  left ventricle has normal function. The left ventricle has no regional   wall motion abnormalities. There is mild concentric left ventricular  hypertrophy. Left ventricular diastolic  parameters are consistent with Grade I diastolic dysfunction (impaired  relaxation).   2. Right ventricular systolic function is normal. The right ventricular  size is normal. There is mildly elevated pulmonary artery systolic  pressure. The estimated right ventricular systolic pressure is [id number] mmHg.   3. Left atrial size was mildly dilated.   4. The mitral valve is normal in structure. Trivial mitral valve  regurgitation. No evidence of mitral stenosis.   5. The aortic valve was not well visualized. There is mild calcification  of the aortic valve. There is mild thickening of the aortic valve. Aortic  valve regurgitation is not visualized. Mild aortic valve stenosis. Aortic  valve mean gradient measures  14.0 mmHg. Normal LV stroke volume index DVI 0.56.   6. The inferior vena cava is dilated in size with <50% respiratory  variability, suggesting right atrial pressure of 15 mmHg.   Comparison(s): No significant change from prior study.  05/2021 Carotid duplex IMPRESSION: Minor carotid atherosclerosis. Negative for significant stenosis. Degree of narrowing less than 50% bilaterally by ultrasound criteria.   Patent antegrade vertebral flow bilaterally  Findings suspicious for abnormal supraclavicular adenopathy. Consider follow-up imaging with neck CT with contrast.  Renal artery duplex 10/02/2020  Right: No evidence of right renal artery stenosis. RRV flow present.         Cyst(s) noted. Normal size right kidney. Abnormal right         Resistive Index. Normal cortical thickness of right kidney.  Left:  No evidence of left renal artery stenosis. LRV flow present.         Cyst(s) noted. Normal size of left kidney. Abnormal left         Resisitve Index. Normal cortical thickness of the left         kidney.  Mesenteric:  Normal Celiac artery and Superior Mesenteric  artery findings. Areas of  limited  visceral study include left renal artery due to dense bowel gasocket in  this  area.   Echo 10/09/2020  1. Left ventricular ejection fraction, by estimation, is 60 to 65%. The  left ventricle has normal function. The left ventricle has no regional  wall motion abnormalities. Left ventricular diastolic parameters were  normal.   2. Right ventricular systolic function is normal. The right ventricular  size is normal. There is moderately elevated pulmonary artery systolic  pressure. The estimated right ventricular systolic pressure is 47.6 mmHg.   3. The mitral valve is normal in structure. Mild mitral valve  regurgitation. No evidence of mitral stenosis.   4. The aortic valve is tricuspid. There is moderate calcification of the  aortic valve. There is moderate thickening of the aortic valve. Aortic  valve regurgitation is not visualized. Mild to moderate aortic valve  stenosis. Aortic valve area, by VTI  measures 1.39 cm. Aortic valve mean gradient measures 14.2 mmHg. Aortic  valve Vmax measures 2.54 m/s.   5. The inferior vena cava is normal in size with greater than 50%  respiratory variability, suggesting right atrial pressure of 3 mmHg.   EKG: No EKG today  Recent Labs: 10/05/2021: ALT 27; B Natriuretic Peptide 63.7; BUN 14; Creatinine, Ser 1.78; Hemoglobin 11.9; Magnesium 1.5; Platelets 188; Potassium 3.2; Sodium 140  Recent Lipid Panel    Component Value Date/Time   CHOL 140 12/25/2020 0742   TRIG 138 12/25/2020 0742   HDL 49 12/25/2020 0742   CHOLHDL 2.9 12/25/2020 0742   CHOLHDL 2.8 10/16/2020 0916   VLDL 19 04/08/2016 0828   LDLCALC 67 12/25/2020 0742   LDLCALC 74 10/16/2020 0916   Home Medications   Current Meds  Medication Sig   albuterol (VENTOLIN HFA) 108 (90 Base) MCG/ACT inhaler Inhale 2 puffs into the lungs every 6 (six) hours as needed for wheezing or shortness of breath.   allopurinol (ZYLOPRIM) 100 MG tablet TAKE 1  TABLET BY MOUTH EVERY DAY   aspirin EC 81 MG tablet Take 1 tablet (81 mg total) by mouth daily. Swallow whole.   Blood Glucose Monitoring Suppl (FREESTYLE LITE) DEVI Use to check blood sugar once daily   carvedilol (COREG) 25 MG tablet Take 0.5 tablets (12.5 mg total) by mouth 2 (two) times daily with a meal.   Cholecalciferol (VITAMIN D-3) 1000 UNITS CAPS Take by mouth daily. 2 capsules.   dapagliflozin propanediol (FARXIGA) 10 MG TABS tablet Take 1 tablet (10 mg total) by mouth daily before breakfast.   ferrous sulfate 325 (65 FE) MG tablet Take 1 tablet (325 mg total) by mouth 2 (two) times daily with a meal.   fish oil-omega-3 fatty acids 1000 MG capsule Take 1,200  mg by mouth daily.    glucose blood (FREESTYLE LITE) test strip USE TO CHECK BLOOD SUGAR 1-2x DAILY. E11.59   Multiple Vitamin (MULTIVITAMIN WITH MINERALS) TABS Take 1 tablet by mouth daily.   pantoprazole (PROTONIX) 40 MG tablet Take 1 tablet (40 mg total) by mouth daily.   PRESCRIPTION MEDICATION Uses C-PAP at bedtime   rosuvastatin (CRESTOR) 40 MG tablet Take 1 tablet (40 mg total) by mouth daily.   umeclidinium-vilanterol (ANORO ELLIPTA) 62.5-25 MCG/ACT AEPB Inhale 1 puff into the lungs daily.   valsartan (DIOVAN) 320 MG tablet TAKE 1 TABLET BY MOUTH EVERY DAY   [DISCONTINUED] doxazosin (CARDURA) 4 MG tablet Take 1 tablet (4 mg total) by mouth 2 (two) times daily.     Review of Systems      All other systems reviewed and are otherwise negative except as noted above.  Physical Exam    VS:  BP 136/70   Pulse (!) 56   Ht 5\' 7"  (1.702 m)   Wt 206 lb (93.4 kg)   BMI 32.26 kg/m  , BMI Body mass index is 32.26 kg/m.  Wt Readings from Last 3 Encounters:  10/16/21 206 lb (93.4 kg)  10/05/21 208 lb (94.3 kg)  09/08/21 208 lb 3.2 oz (94.4 kg)     GEN: Well nourished, well developed, in no acute distress. HEENT: normal. Neck: Supple, no JVD, carotid bruits, or masses. Cardiac: RRR, no  rubs, or gallops. Gr 3/6 systolic  murmur. No clubbing, cyanosis, edema.  Radials/PT 2+ and equal bilaterally.  Respiratory:  Respirations regular and unlabored, clear to auscultation bilaterally. GI: Soft, nontender, nondistended. MS: No deformity or atrophy. Skin: Warm and dry, no rash. Neuro:  Strength and sensation are intact. Psych: Normal affect.  Assessment & Plan    Murmur / Mild to moderate AS - Mild aortic stenosis by echo 05/2021 and 10/2021 with mean gradient 14.17mmHg. Continue optimal blood pressure control. Repeat ECHO 10/2022.   Pulmonary hypertension / COPD - No heart failure symptoms. PFT 06/2021 abnormal referred to pulmonology. VQ 06/2021 no PE. Follows with pulmonology.   Hypertension - Recent admission for hypotension. Repeat in office mildly elevated. Plan to have patient take blood pressure 1 hour after medication. Bring at home blood pressure into office during next visit. Continue doxazosin 6mg , valsartan 320mg , and carvedilol 25mg   Bradycardia - Asymptomatic with no lightheadedness nor dizziness. Continue current dose Coreg 12.5mg  twice daily as it is providing optimal BP control. If notes symptoms of bradycardia plan to reduce dose. Heart rate in the 50's in office.   Dm2 - Follows with endocrinology. Appreciate inclusion of SGLT2i.  OSA on CPAP - CPAP compliance encouraged.   CAD with 60 to 70% stenosis of RCA - With recent chest pain admission will plan for [facility] for ischemic evaluation. Trops negative x3 during recent admission. CP overall atypical but last ischemic evaluation done in [place] over ten years ago. Will add Aspirin 81mg  EC to regimen. GDMT includes Coreg, Rosuvastatin, Aspirin. Heart healthy diet and regular cardiovascular exercise encouraged.    Shared Decision Making/Informed Consent The risks [chest pain, shortness of breath, cardiac arrhythmias, dizziness, blood pressure fluctuations, myocardial infarction, stroke/transient ischemic attack, nausea, vomiting, allergic reaction,  radiation exposure, metallic taste sensation and life-threatening complications (estimated to be 1 in 10,000)], benefits (risk stratification, diagnosing coronary artery disease, treatment guidance) and alternatives of a nuclear stress test were discussed in detail with Mr. Doring and he agrees to proceed.   Hyperlipidemia, LDL goal less than  70 - 12/25/20 LDL 67 after on Crestor. LDL now at goal. Continue Crestor 40mg  daily. Denies myalgias.   Disposition: Follow up as scheduled with Dr. Oval Linsey or APP.  Signed, Loel Dubonnet, NP 10/16/2021, 9:43 AM [place]

## 2021-10-15 NOTE — Telephone Encounter (Signed)
Pharmacy faxed a refill request for rosuvastatin (CRESTOR) 40 MG tablet [[id number]]    Order Details Dose: 40 mg Route: Oral Frequency: Daily  Dispense Quantity: 90 tablet Refills: 3   Note to Pharmacy: NEW DOSE, D/C 20 MG RX PATIENT WILL CALL WHEN NEEDS FILLED       Sig: Take 1 tablet (40 mg total) by mouth daily.       Start Date: 09/25/20 End Date: --  Written Date: 09/25/20 Expiration Date: 09/25/21  Original Order:  rosuvastatin (CRESTOR) 20 MG tablet [[id number]]

## 2021-10-15 NOTE — Telephone Encounter (Signed)
Please advice. Looks like pt has an allergy to this medication?

## 2021-10-16 ENCOUNTER — Ambulatory Visit (INDEPENDENT_AMBULATORY_CARE_PROVIDER_SITE_OTHER): Payer: Medicare Other | Admitting: Family

## 2021-10-16 ENCOUNTER — Encounter (HOSPITAL_BASED_OUTPATIENT_CLINIC_OR_DEPARTMENT_OTHER): Payer: Self-pay | Admitting: Family

## 2021-10-16 VITALS — BP 136/70 | HR 56 | Ht 67.0 in | Wt 206.0 lb

## 2021-10-16 DIAGNOSIS — N1832 Chronic kidney disease, stage 3b: Secondary | ICD-10-CM

## 2021-10-16 DIAGNOSIS — G4733 Obstructive sleep apnea (adult) (pediatric): Secondary | ICD-10-CM

## 2021-10-16 DIAGNOSIS — I350 Nonrheumatic aortic (valve) stenosis: Secondary | ICD-10-CM | POA: Diagnosis not present

## 2021-10-16 DIAGNOSIS — E785 Hyperlipidemia, unspecified: Secondary | ICD-10-CM | POA: Diagnosis not present

## 2021-10-16 DIAGNOSIS — I272 Pulmonary hypertension, unspecified: Secondary | ICD-10-CM | POA: Diagnosis not present

## 2021-10-16 DIAGNOSIS — I2510 Atherosclerotic heart disease of native coronary artery without angina pectoris: Secondary | ICD-10-CM | POA: Diagnosis not present

## 2021-10-16 DIAGNOSIS — Z9989 Dependence on other enabling machines and devices: Secondary | ICD-10-CM

## 2021-10-16 DIAGNOSIS — I10 Essential (primary) hypertension: Secondary | ICD-10-CM

## 2021-10-16 MED ORDER — ROSUVASTATIN CALCIUM 40 MG PO TABS: 40.0000 mg | ORAL_TABLET | Freq: Every day | ORAL | 3 refills | Status: DC

## 2021-10-16 MED ORDER — ASPIRIN 81 MG PO TBEC: 81.0000 mg | DELAYED_RELEASE_TABLET | Freq: Every day | ORAL | 3 refills | Status: AC

## 2021-10-16 MED ORDER — DOXAZOSIN MESYLATE 4 MG PO TABS: 6.0000 mg | ORAL_TABLET | Freq: Two times a day (BID) | ORAL | 3 refills | Status: DC

## 2021-10-16 NOTE — Patient Instructions (Signed)
.  Medication Instructions:  Your physician has recommended you make the following change in your medication:   Continue: Doxazosin 6mg  (1.5 tablets) mg twice daily   Start: Aspirin 81mg  EC daily   *If you need a refill on your cardiac medications before your next appointment, please call your pharmacy*  Testing/Procedures: Your Physician has requested you have a Myocardial Perfusion Imaging Study.   Please arrive 15 minutes prior to your appointment time for registration and insurance purposes.   The test will take approximately 3 to 4 hours to complete; you may bring reading material. If someone comes with you to your appointment, they will need to remain in the main lobby due to limited testing space in the testing area. **If you are pregnant or breastfeeding, please notify the nuclear lab prior to your appointment**   How to prepare for your test:  - Do not eat or drink 3 hours prior to your test, except you may have water  - Do not consume products containing caffeine ( regular or decaf) 12 hours prior to your test ( coffee, chocolate, sodas or teas)  - Do bring a list of your current medications with you. If not listed below, you may take your medications as normal (Hold beta blocker- 24 hour prior for exercise myoview)   - Do wear comfortable clothes (no dresses or overalls) and walking shoes ( tennis shoes preferred), no heel or open toe shoes are allowed - Do not wear cologne, perfume, aftershave, or lotions ( deodorant is allowed)  - If these instructions are not followed, your test will be rescheduled   If you cannot keep your appointment, please provide 24 hours notice to the Nuclear Lab, to avoid a possible $50 charge to your account!    Follow-Up: At [facility], you and your health needs are our priority.  As part of our continuing mission to provide you with exceptional heart care, we have created designated Provider Care Teams.  These Care Teams include your  primary Cardiologist (physician) and Advanced Practice Providers (APPs -  Physician Assistants and Nurse Practitioners) who all work together to provide you with the care you need, when you need it.  We recommend signing up for the patient portal called "MyChart".  Sign up information is provided on this After Visit Summary.  MyChart is used to connect with patients for Virtual Visits (Telemedicine).  Patients are able to view lab/test results, encounter notes, upcoming appointments, etc.  Non-urgent messages can be sent to your provider as well.   To learn more about what you can do with MyChart, go to .    Your next appointment:   Follow up as scheduled   Other Instructions Please check your blood pressure about an hour after your meds, 11am, and [facility] will call you in about 2 weeks to check in on your blood pressures!   Please bring your blood pressure cuff with you to your next appointment.   Important Information About Sugar

## 2021-10-19 ENCOUNTER — Other Ambulatory Visit (HOSPITAL_BASED_OUTPATIENT_CLINIC_OR_DEPARTMENT_OTHER): Payer: Self-pay | Admitting: Family

## 2021-10-19 DIAGNOSIS — I2510 Atherosclerotic heart disease of native coronary artery without angina pectoris: Secondary | ICD-10-CM

## 2021-10-26 ENCOUNTER — Telehealth (HOSPITAL_COMMUNITY): Payer: Self-pay

## 2021-10-26 NOTE — Telephone Encounter (Signed)
Detailed instructions left on the patient's answering machine. Asked to call back with any questions. S.Tommi Crepeau EMTP 

## 2021-10-29 ENCOUNTER — Ambulatory Visit (HOSPITAL_COMMUNITY): Payer: Medicare Other | Attending: Family

## 2021-10-29 DIAGNOSIS — I2510 Atherosclerotic heart disease of native coronary artery without angina pectoris: Secondary | ICD-10-CM

## 2021-10-29 LAB — MYOCARDIAL PERFUSION IMAGING
LV dias vol: 92 mL (ref 62–150)
LV sys vol: 38 mL
Nuc Stress EF: 59 %
Peak HR: 70 {beats}/min
Rest HR: 60 {beats}/min
Rest Nuclear Isotope Dose: 10.8 mCi
SDS: 2
SRS: 0
SSS: 2
ST Depression (mm): 0 mm
Stress Nuclear Isotope Dose: 32 mCi
TID: 0.94

## 2021-10-29 MED ORDER — TECHNETIUM TC 99M TETROFOSMIN IV KIT
32.0000 | PACK | Freq: Once | INTRAVENOUS | Status: AC | PRN
  Administered 2021-10-29: 32 via INTRAVENOUS

## 2021-10-29 MED ORDER — REGADENOSON 0.4 MG/5ML IV SOLN
0.4000 mg | Freq: Once | INTRAVENOUS | Status: AC
  Administered 2021-10-29: 0.4 mg via INTRAVENOUS

## 2021-10-29 MED ORDER — TECHNETIUM TC 99M TETROFOSMIN IV KIT
10.8000 | PACK | Freq: Once | INTRAVENOUS | Status: AC | PRN
  Administered 2021-10-29: 10.8 via INTRAVENOUS

## 2021-11-02 ENCOUNTER — Encounter (HOSPITAL_BASED_OUTPATIENT_CLINIC_OR_DEPARTMENT_OTHER): Payer: Self-pay

## 2021-11-03 ENCOUNTER — Telehealth (HOSPITAL_BASED_OUTPATIENT_CLINIC_OR_DEPARTMENT_OTHER): Payer: Self-pay

## 2021-11-03 ENCOUNTER — Ambulatory Visit (HOSPITAL_BASED_OUTPATIENT_CLINIC_OR_DEPARTMENT_OTHER): Payer: Medicare Other | Admitting: Cardiovascular Disease

## 2021-11-03 NOTE — Telephone Encounter (Addendum)
Called results to patient and left results on VM (ok per DPR), instructions left to call office back if patient has any questions!    ----- Message from Loel Dubonnet, NP sent at 10/29/2021  4:31 PM EDT ----- Stress test low risk with no evidence of ischemia and normal LVEF.  This is reassuring.  Follow-up as scheduled.

## 2021-11-05 NOTE — Telephone Encounter (Signed)
BP log:

## 2021-11-06 MED ORDER — DOXAZOSIN MESYLATE 8 MG PO TABS: 8.0000 mg | ORAL_TABLET | Freq: Two times a day (BID) | ORAL | 3 refills | Status: DC

## 2021-11-19 ENCOUNTER — Telehealth: Payer: Self-pay | Admitting: Student

## 2021-11-19 NOTE — Telephone Encounter (Signed)
Can we reschedule with any MD next available? He's scheduled for 8:30 during my procedure block.  Thanks!  Nate

## 2021-11-19 NOTE — Telephone Encounter (Signed)
Spoke with patient's wife, they will call back to reschedule. Canceling appointment.

## 2021-11-19 NOTE — Telephone Encounter (Signed)
I tried calling the pt to reschedule- had to [facility]

## 2021-11-20 ENCOUNTER — Encounter (HOSPITAL_BASED_OUTPATIENT_CLINIC_OR_DEPARTMENT_OTHER): Payer: Self-pay | Admitting: Family

## 2021-11-20 ENCOUNTER — Ambulatory Visit (INDEPENDENT_AMBULATORY_CARE_PROVIDER_SITE_OTHER): Payer: Medicare Other | Admitting: Family

## 2021-11-20 VITALS — BP 140/66 | HR 58 | Ht 68.0 in | Wt 205.0 lb

## 2021-11-20 DIAGNOSIS — G4733 Obstructive sleep apnea (adult) (pediatric): Secondary | ICD-10-CM | POA: Diagnosis not present

## 2021-11-20 DIAGNOSIS — R001 Bradycardia, unspecified: Secondary | ICD-10-CM

## 2021-11-20 DIAGNOSIS — I2510 Atherosclerotic heart disease of native coronary artery without angina pectoris: Secondary | ICD-10-CM

## 2021-11-20 DIAGNOSIS — I272 Pulmonary hypertension, unspecified: Secondary | ICD-10-CM

## 2021-11-20 DIAGNOSIS — I10 Essential (primary) hypertension: Secondary | ICD-10-CM | POA: Diagnosis not present

## 2021-11-20 DIAGNOSIS — I350 Nonrheumatic aortic (valve) stenosis: Secondary | ICD-10-CM

## 2021-11-20 DIAGNOSIS — E785 Hyperlipidemia, unspecified: Secondary | ICD-10-CM | POA: Diagnosis not present

## 2021-11-20 NOTE — Patient Instructions (Signed)
Medication Instructions:  Continue your current medications.   *If you need a refill on your cardiac medications before your next appointment, please call your pharmacy*   Lab Work/Testing/Procedures: None ordered today.   Your recent stress test looked great!   Follow-Up: At [facility], you and your health needs are our priority.  As part of our continuing mission to provide you with exceptional heart care, we have created designated Provider Care Teams.  These Care Teams include your primary Cardiologist (physician) and Advanced Practice Providers (APPs -  Physician Assistants and Nurse Practitioners) who all work together to provide you with the care you need, when you need it.  We recommend signing up for the patient portal called "MyChart".  Sign up information is provided on this After Visit Summary.  MyChart is used to connect with patients for Virtual Visits (Telemedicine).  Patients are able to view lab/test results, encounter notes, upcoming appointments, etc.  Non-urgent messages can be sent to your provider as well.   To learn more about what you can do with MyChart, go to [email or web address].    Your next appointment:    You are due for sleep follow up with Dr. Claiborne Billings, they will reach out to you to schedule.   6 month(s) In Person with Skeet Latch, MD or Laurann Montana, NP    Other Instructions  Heart Healthy Diet Recommendations: A low-salt diet is recommended. Meats should be grilled, baked, or boiled. Avoid fried foods. Focus on lean protein sources like fish or chicken with vegetables and fruits. The American Heart Association is a Microbiologist!  American Heart Association Diet and Lifeystyle Recommendations   Exercise recommendations: The American Heart Association recommends 150 minutes of moderate intensity exercise weekly. Try 30 minutes of moderate intensity exercise 4-5 times per week. This could include walking, jogging, or  swimming.   Important Information About Sugar

## 2021-11-20 NOTE — Progress Notes (Signed)
Office Visit    Patient Name: James Jones Date of Encounter: 11/20/2021  PCP:  James Frizzle, MD   [place]  Cardiologist:  James Latch, MD  Advanced Practice Provider:  No care team member to display Electrophysiologist:  None      Chief Complaint    James Jones is a 76 y.o. male with a hx of CKD stage III, nonocclusive coronary artery disease, DM2, GERD, hyperlipidemia, hypertension, OSA on CPAP, aortic stenosis presents today for follow-up after James Jones   Past Medical History    Past Medical History:  Diagnosis Date   Aortic stenosis 04/02/2021   Chronic diastolic heart failure ([place])    CKD (chronic kidney disease) stage 2, GFR 60-89 ml/min 03/17/2012   Coronary artery disease, non-occlusive 03/18/2012   60-70% stenosis in RCA -- FFR 0.84; EF 60-65%   Diabetes mellitus without complication ([place])    GERD (gastroesophageal reflux disease)    On PPI   History of GI bleed 02/2013   No obvious findings on EGD/colonoscopy   Hydrocele, bilateral    large on [place] 2020   Hyperlipidemia    Hypertension    Obesity (BMI 30.0-34.9) 10/02/2012   OSA (obstructive sleep apnea) 03/18/2012   Doing better with CPAP   Pulmonary hypertension ([place]) 12/10/2010   ECHO:  Mild PH,mild LVH; PA pressures estimated 30-40 mmHg   Pulmonary hypertension, unspecified ([place]) 06/04/2021   RBBB, 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   Event monitor; usually during sleeping hours; therefore not on beta blocker   Past Surgical History:  Procedure Laterality Date   [place]   Left eye   HEMORRHOID SURGERY     LEFT HEART CATH AND CORONARY ANGIOGRAPHY  03/27/2010   Moderate mid RCA lesion,right radial approach,normal EF   LEFT HEART CATHETERIZATION WITH CORONARY ANGIOGRAM N/A 03/17/2012   Procedure: LEFT HEART CATHETERIZATION WITH CORONARY ANGIOGRAM;  Surgeon: James Man, MD;   Location: [facility] CATH LAB;  Service: Cardiovascular: Fractional Flow Reserve Measurement of mid RCA 60-70% stenosis = 0.84. Otherwise mild LCA CAD.  Normal EF & EDP   SHOULDER ARTHROSCOPY  2006   TRANSTHORACIC ECHOCARDIOGRAM  12/2010    Mild concentric LVH.  EF> 55%.  GR 1 DD.  Mild aortic sclerosis no stenosis.  PA pressures estimated 30-40 mmHg    Allergies  Allergies  Allergen Reactions   Atorvastatin     myalgias    History of Present Illness    James Jones is a 76 y.o. male with a hx of CKD stage III, nonocclusive coronary artery disease, DM2, GERD, hyperlipidemia, hypertension, OSA on CPAP, aortic stenosis.  He was last seen 08/04/21.  Remote cardiac catheterization February 2014 with 60 to 70% RCA stenosis.  Previous myalgias on atorvastatin but tolerates rosuvastatin.  He was seen by James Jones of 09/25/2020.  He endorsed heartburn and trialed pantoprazole without much improvement. Grade 3 out of 6 murmur was noted on exam and echocardiogram ordered.  Blood pressure was poorly controlled and as such atenolol changed to carvedilol and doxazosin increased.  Amlodipine and valsartan were continued.  Renal artery duplex showed no renal artery stenosis bilaterally.  Echocardiogram 10/09/2020 with normal LVEF 60 to 65%, no R WMA, moderately elevated PASP, mild MR, mild to moderate aortic valve stenosis.   At follow up 11/2020 BP was well controlled. At follow up 04/2021 BP was elevated. Doxazosin increased  to 4mg  BID. At follow up 04/2021 BP was well controlled. Carotid duplex 05/2021 minor carotid atherosclerosis negative for significant stenosis. Did note possible abnormal supraclavicular adenopathy and PCP recommended CT. Echo 05/2021 LVEF 60-65%, mild LVH, gr2DD, RV normal function, RV mildly enlarged, severely elevated PASP, bilateral atria mildly dilated, trivial MR, RA pressure 75mmHg, mild aortic stenosis. At follow up 06/04/21 no heart failure symptoms but did notice exertional dyspnea. PFTs, VQ  scan ordered. VQ scan with no PE. CT 06/17/21 asymmetric left supraclavicular soft tissue which may reflect adjacent nonspecific nonenlarged lymph nodes. CT chest Lung-RADS2 recommended for follow up 12 mos.  PFTs were abnormal and referred to pulmonology.    Seen 08/04/21 feeling overall well - exercising three times per week at [facility] for 30 minutes. Due to pulmonary hypertension, exertional dyspnea - referred to pulmonology. He was started on Anoro by pulmonology 08/2021.   Admitted 8/28-8/29/23 after presenting with chest pain. Chest pain was atypical sharp, left sided with positional component. HS troponin negative x3.  Noted to be bradycardic and hypotensive while admitted. Carvedilol decreased by half, Amlodipine discontinued, Doxazosin decreased. Echo 10/06/21 with EF 60-65%, mild LVH, gr1DD, mildly elevated PASP, LA mildly dilated, trivial MR, mild AS (mean gradient 69mmHg).   Seen in clinic postdischarge and recommended for Myoview.  Myoview 10/29/2021 low risk study with no evidence of ischemia.  He presents today for follow-up independently.  Feeling overall well since last seen.  Reassured by stress test results.  Taking blood pressure at home with blood pressure consistently less than 130/80 when he checks after his medications.  Continues to exercise 3 times per week at the [facility]. Reports no shortness of breath nor dyspnea on exertion. Reports no chest pain, pressure, or tightness. No edema, orthopnea, PND. Reports no palpitations.    EKGs/Labs/Other Studies Reviewed:   The following studies were reviewed today:  Lexiscan Myoview 10/29/21   The study is normal. The study is low risk.   Resting ECG rhythm shows sinus bradycardia. ECG demonstrates right bundle branch block. The ECG shows premature ventricular contractions.   No ST deviation was noted. There were no arrhythmias during stress. There were no arrhythmias during recovery.   LV perfusion is normal. There is no evidence of ischemia.  There is no evidence of infarction. There is a mild inferior wall perfusion defect on supine stress and upright stress imaging. Wall motion is normal in this region, and there is a prominent loop of bowel adjacent to the inferior wall on stress images that may be contributing to attenuation artifact.   Left ventricular function is normal. Nuclear stress EF: 59 %. The left ventricular ejection fraction is normal (55-65%). End diastolic cavity size is normal. End systolic cavity size is normal.   Prior study not available for comparison.   Given artifact, consider alternate stress modality if symptoms are refractory to medical therapy.  TTE 10/06/21 IMPRESSIONS   1. Left ventricular ejection fraction, by estimation, is 60 to 65%. The  left ventricle has normal function. The left ventricle has no regional  wall motion abnormalities. There is mild concentric left ventricular  hypertrophy. Left ventricular diastolic  parameters are consistent with Grade I diastolic dysfunction (impaired  relaxation).   2. Right ventricular systolic function is normal. The right ventricular  size is normal. There is mildly elevated pulmonary artery systolic  pressure. The estimated right ventricular systolic pressure is [id number] mmHg.   3. Left atrial size was mildly dilated.   4. The mitral valve is  normal in structure. Trivial mitral valve  regurgitation. No evidence of mitral stenosis.   5. The aortic valve was not well visualized. There is mild calcification  of the aortic valve. There is mild thickening of the aortic valve. Aortic  valve regurgitation is not visualized. Mild aortic valve stenosis. Aortic  valve mean gradient measures  14.0 mmHg. Normal LV stroke volume index DVI 0.56.   6. The inferior vena cava is dilated in size with <50% respiratory  variability, suggesting right atrial pressure of 15 mmHg.   Comparison(s): No significant change from prior study.  05/2021 Carotid duplex IMPRESSION: Minor  carotid atherosclerosis. Negative for significant stenosis. Degree of narrowing less than 50% bilaterally by ultrasound criteria.   Patent antegrade vertebral flow bilaterally   Findings suspicious for abnormal supraclavicular adenopathy. Consider follow-up imaging with neck CT with contrast.  Renal artery duplex 10/02/2020  Right: No evidence of right renal artery stenosis. RRV flow present.         Cyst(s) noted. Normal size right kidney. Abnormal right         Resistive Index. Normal cortical thickness of right kidney.  Left:  No evidence of left renal artery stenosis. LRV flow present.         Cyst(s) noted. Normal size of left kidney. Abnormal left         Resisitve Index. Normal cortical thickness of the left         kidney.  Mesenteric:  Normal Celiac artery and Superior Mesenteric artery findings. Areas of  limited  visceral study include left renal artery due to dense bowel gasocket in  this  area.   Echo 10/09/2020  1. Left ventricular ejection fraction, by estimation, is 60 to 65%. The  left ventricle has normal function. The left ventricle has no regional  wall motion abnormalities. Left ventricular diastolic parameters were  normal.   2. Right ventricular systolic function is normal. The right ventricular  size is normal. There is moderately elevated pulmonary artery systolic  pressure. The estimated right ventricular systolic pressure is 0000000 mmHg.   3. The mitral valve is normal in structure. Mild mitral valve  regurgitation. No evidence of mitral stenosis.   4. The aortic valve is tricuspid. There is moderate calcification of the  aortic valve. There is moderate thickening of the aortic valve. Aortic  valve regurgitation is not visualized. Mild to moderate aortic valve  stenosis. Aortic valve area, by VTI  measures 1.39 cm. Aortic valve mean gradient measures 14.2 mmHg. Aortic  valve Vmax measures 2.54 m/s.   5. The inferior vena cava is normal in size with  greater than 50%  respiratory variability, suggesting right atrial pressure of 3 mmHg.   EKG: No EKG today  Recent Labs: 10/05/2021: ALT 27; B Natriuretic Peptide 63.7; BUN 14; Creatinine, Ser 1.78; Hemoglobin 11.9; Magnesium 1.5; Platelets 188; Potassium 3.2; Sodium 140  Recent Lipid Panel    Component Value Date/Time   CHOL 140 12/25/2020 0742   TRIG 138 12/25/2020 0742   HDL 49 12/25/2020 0742   CHOLHDL 2.9 12/25/2020 0742   CHOLHDL 2.8 10/16/2020 0916   VLDL 19 04/08/2016 0828   LDLCALC 67 12/25/2020 0742   LDLCALC 74 10/16/2020 0916   Home Medications   Current Meds  Medication Sig   albuterol (VENTOLIN HFA) 108 (90 Base) MCG/ACT inhaler Inhale 2 puffs into the lungs every 6 (six) hours as needed for wheezing or shortness of breath.   allopurinol (ZYLOPRIM) 100 MG tablet TAKE 1  TABLET BY MOUTH EVERY DAY   aspirin EC 81 MG tablet Take 1 tablet (81 mg total) by mouth daily. Swallow whole.   Blood Glucose Monitoring Suppl (FREESTYLE LITE) DEVI Use to check blood sugar once daily   carvedilol (COREG) 25 MG tablet Take 0.5 tablets (12.5 mg total) by mouth 2 (two) times daily with a meal.   Cholecalciferol (VITAMIN D-3) 1000 UNITS CAPS Take by mouth daily. 2 capsules.   dapagliflozin propanediol (FARXIGA) 10 MG TABS tablet Take 1 tablet (10 mg total) by mouth daily before breakfast.   doxazosin (CARDURA) 8 MG tablet Take 1 tablet (8 mg total) by mouth 2 (two) times daily.   ferrous sulfate 325 (65 FE) MG tablet Take 1 tablet (325 mg total) by mouth 2 (two) times daily with a meal.   fish oil-omega-3 fatty acids 1000 MG capsule Take 1,200 mg by mouth daily.    glucose blood (FREESTYLE LITE) test strip USE TO CHECK BLOOD SUGAR 1-2x DAILY. E11.59   Multiple Vitamin (MULTIVITAMIN WITH MINERALS) TABS Take 1 tablet by mouth daily.   pantoprazole (PROTONIX) 40 MG tablet Take 1 tablet (40 mg total) by mouth daily.   PRESCRIPTION MEDICATION Uses C-PAP at bedtime   rosuvastatin (CRESTOR) 40  MG tablet Take 1 tablet (40 mg total) by mouth daily.   umeclidinium-vilanterol (ANORO ELLIPTA) 62.5-25 MCG/ACT AEPB Inhale 1 puff into the lungs daily.   valsartan (DIOVAN) 320 MG tablet TAKE 1 TABLET BY MOUTH EVERY DAY     Review of Systems      All other systems reviewed and are otherwise negative except as noted above.  Physical Exam    VS:  BP (!) 140/66   Pulse (!) 58   Ht 5\' 8"  (1.727 m)   Wt 205 lb (93 kg)   BMI 31.17 kg/m  , BMI Body mass index is 31.17 kg/m.  Wt Readings from Last 3 Encounters:  11/20/21 205 lb (93 kg)  10/29/21 206 lb (93.4 kg)  10/16/21 206 lb (93.4 kg)    GEN: Well nourished, overweight, well developed, in no acute distress. HEENT: normal. Neck: Supple, no JVD, carotid bruits, or masses. Cardiac: RRR, no  rubs, or gallops. Gr 3/6 systolic murmur. No clubbing, cyanosis, edema.  Radials/PT 2+ and equal bilaterally.  Respiratory:  Respirations regular and unlabored, clear to auscultation bilaterally. GI: Soft, nontender, nondistended. MS: No deformity or atrophy. Skin: Warm and dry, no rash. Neuro:  Strength and sensation are intact. Psych: Normal affect.  Assessment & Plan    Murmur / Mild to moderate AS - Mild aortic stenosis by echo 05/2021 and 10/2021 with mean gradient 14.54mmHg. Continue optimal blood pressure control. Repeat ECHO 10/2022. Can be coordinated at follow up.   Pulmonary hypertension / COPD - No heart failure symptoms. PFT 06/2021 abnormal referred to pulmonology. VQ 06/2021 no PE. Follows with pulmonology.   Hypertension - BP elevated in clinic. Well controlled at home after medications <130/80. Continue present dose Doxazosin, Carvedilol. Discussed to monitor BP at home at least 2 hours after medications and sitting for 5-10 minutes. He will contact us if BP consistently >130/80. If noted, could resume Amlodipine which was previously discontinued due to hypotension.   Bradycardia - Asymptomatic with no lightheadedness nor dizziness.  Continue current dose Coreg 12.5mg  twice daily as it is providing optimal BP control. If notes symptoms of bradycardia plan to reduce dose. Heart rate in the 50's in office.   Dm2 - Follows with endocrinology. Appreciate inclusion of SGLT2i.  OSA on CPAP - CPAP compliance encouraged.   CAD with 60 to 70% stenosis of RCA - 10/2021 low risk stress test. Stable with no anginal symptoms. GDMT includes Coreg, Rosuvastatin, Aspirin. Heart healthy diet and regular cardiovascular exercise encouraged.    Hyperlipidemia, LDL goal less than 70 - 12/25/20 LDL 67 after on Crestor. LDL now at goal. Continue Crestor 40mg  daily. Denies myalgias.   Disposition: Follow up in 6 months with Dr. Oval Linsey or APP.  Signed, Loel Dubonnet, NP 11/20/2021, 10:01 AM [place]

## 2021-11-26 ENCOUNTER — Ambulatory Visit: Payer: Medicare Other | Admitting: Student

## 2021-11-27 DIAGNOSIS — Z23 Encounter for immunization: Secondary | ICD-10-CM | POA: Diagnosis not present

## 2021-12-09 ENCOUNTER — Encounter: Payer: Self-pay | Admitting: Cardiovascular Disease

## 2021-12-09 ENCOUNTER — Ambulatory Visit: Payer: Medicare Other | Attending: Cardiovascular Disease | Admitting: Cardiovascular Disease

## 2021-12-09 VITALS — BP 120/64 | HR 61 | Ht 68.0 in | Wt 208.4 lb

## 2021-12-09 DIAGNOSIS — E785 Hyperlipidemia, unspecified: Secondary | ICD-10-CM | POA: Insufficient documentation

## 2021-12-09 DIAGNOSIS — I2510 Atherosclerotic heart disease of native coronary artery without angina pectoris: Secondary | ICD-10-CM | POA: Diagnosis not present

## 2021-12-09 DIAGNOSIS — G4733 Obstructive sleep apnea (adult) (pediatric): Secondary | ICD-10-CM | POA: Insufficient documentation

## 2021-12-09 DIAGNOSIS — E669 Obesity, unspecified: Secondary | ICD-10-CM | POA: Insufficient documentation

## 2021-12-09 DIAGNOSIS — I10 Essential (primary) hypertension: Secondary | ICD-10-CM | POA: Diagnosis not present

## 2021-12-09 DIAGNOSIS — N1832 Chronic kidney disease, stage 3b: Secondary | ICD-10-CM | POA: Insufficient documentation

## 2021-12-09 NOTE — Progress Notes (Signed)
Patient ID: James Jones, male   DOB: 1945-04-25, 76 y.o.   MRN: [id number]       Primary cardiology: Dr. Glenetta Hew > Dr. Oval Linsey Primary: Jenna Luo, MD  HPI: James Jones is a 76 y.o. male who presents to the office today for a 13 month follow-up sleep clinic evaluation.   James Jones originally from [place] to help. While in [place] in the 1990's  he was diagnosed with sleep apnea. He was given a CPAP unit. However, he never used CPAP. He has a history of hypertension, hyperlipidemia, diabetes mellitus, GERD, and remote tobacco use. He smoked for approximately 45 years but quit smoking in March 2000.  In February 2014  he was referred for a split-night sleep study. This confirmed severe sleep apnea. At that time he has significant daytime sleepiness with an Epworth scale of 15. AHI was 69.3 per hour and REM sleep was increased at 71.5 events per hour. He dropped his O2 saturation to 81% with REM sleep and had loud snoring. He underwent a split-night evaluation. He also was prescribed an 18 cm fixed water pressure.  Since initiating CPAP therapy, he does feel he is sleeping better he typically goes to have possibly 9:30 at night and wakes up around 5 to 5:30 in the morning. A download  from 06/21/2012 through June 2014 revealed 77% of the days with usage. He admits that he did not use it when he went on vacation. His average usage on days used to 6 hours and 2 minutes. He had 70% of days greater than 4 hours. HI was excellent at 1.4 is 18 cm water pressure.  When I last saw him in 2017 he was doing well.  He was compliant.  I saw him in September 13, 2016 at which time he was continuing to use CPAP regularly.   He typically goes to bed around 10 PM and wakes up between 5 and 6 AM.  I obtained a new download in the office today from June 16 through July 15.  This reveals usage stays at 87%, and uses greater than 4 hours at 77%.  He was averaging 6 hours 1 minute of sleep.   CPAP set pressure was 18 cm.  AHI was excellent at 1.4.  There was no significant leak.  He states he exercises 3 days per week, typically walks up to 4 miles.  He denied any chest pain or palpitations. An Epworth scale in the office today and this endorsed at 8 with moderate chance of dozing while watching TV, sitting inactive in a public place, lying down to rest in the afternoon when circumstances persist, and slight chance of dozing as a passenger in a car for an hour without a break and will sitting quietly after lunch without alcohol.  He was seen after 3 years  in May 2021.  Over this time span, James Jones states he has continued to be exceptionally compliant with CPAP use.  He states his use is at least 95 to close to 100% of the nights.  He typically goes to bed between 10 and 11 PM and wakes up at 5:30 AM.  He is sleeping well.  His sleep is restorative.  He is unaware of breakthrough snoring.  However he still has nocturia at least 2-3 times per night. A new Epworth Sleepiness Scale score was calculated in the office and this endorsed at 5 the high chance of dozing while watching television and a  moderate chance of dozing while sitting inactive in a public place arguing against residual daytime sleepiness.  He  saw Dr. Ellyn Hack for his primary cardiology care.  He is on amlodipine 10 mg, atenolol 50 mg and valsartan 3 20 mg for hypertension.  Remotely he was on Crestor and apparently at that time he was on pravastatin for lipid management.  Of note, LDL cholesterol was elevated at 141 on April 17, 2019 with total cholesterol 223.  He is diabetic on glipizide.  With his LDL increase, he was subsequently started back on rosuvastatin 20 mg in place of pravastatin.  I saw him on October 08, 2019.  Over the previous months he had continued to do well.  He was planning to reestablish his cardiology care with Dr. Oval Linsey.  When I saw him in May 2021, he qualified for a new ResMed air sense 10 CPAP auto unit.   He received a new machine.  A new download was obtained in the office today from July 31 through October 07, 2019 which showed 100% compliance with average usage at 6 hours and 41 minutes. His AutoSet unit minimum setting was 5 up to a maximum of 20.  95th percentile pressure was 14.5 with a maximum average pressure of 16.9.  AHI calculated at 4.0.  He continued to be on amlodipine 10 mg, atenolol 50 mg, furosemide as needed, and valsartan 320 mg daily for hypertension.  He is diabetic on glipizide.  He continued to be on rosuvastatin and has not had follow-up laboratory since this had been reinstituted.  I last saw him on November 19, 2020.  Since his prior evaluation he remained compliant with CPAP use.  He established his cardiology care with Dr. Oval Linsey.  His blood pressure has been stable on a regimen consisting of amlodipine 10 mg, carvedilol 25 mg twice a day, doxazosin 4 mg daily, valsartan 320 mg.  He is diabetic on Farxiga 10 mg daily in addition to glipizide.  He is on pantoprazole for GERD and takes allopurinol for history of gout.  Obtained a new download of his CPAP therapy from October 20, 2020 through November 18, 2020.  Compliance is excellent.  Average sleep duration is 6 hours and 44 minutes.  He typically goes to bed at 11 PM and wakes up at 6 AM.  His AutoSet unit is set at a minimum pressure of 8 with maximum of 20 and his AHI is 2.5.  He is sleeping well.  He denies breakthrough snoring.  He still wakes up several times per night for nocturia.  Due to progressive increase in creatinine, he was referred by Dr. Dennard Schaumann to [facility] and had seen a nephrologist whose name at present he could not remember.    James Jones last saw him, he has done well.  He denies any chest pain or shortness of breath.  He continues to use CPAP therapy.  A download was obtained from October 2 through December 08, 2021.  Compliance is excellent with 100% use with average use at 7 hours and 36 minutes.  His  ResMed AirSense 10 AutoSet unit is set at a pressure range of 10 to 20 cm with a 95th percentile pressure at 14.7 and maximum average pressure at 17.1.  AHI is 2.8.  He typically goes to bed between 9 and 10 PM and wakes up between 5 and 6 AM.  He has been exercising 3 days/week.  He continues to be on carvedilol 12.5 mg twice a day, [place]  10 mg daily, valsartan 320 mg daily in addition to doxazosin 8 mg twice a day for blood pressure.  He also is on an Anora Ellipta, as needed albuterol for his lungs.  He is on rosuvastatin 40 mg and over-the-counter fish oil for hyperlipidemia and pantoprazole for GERD.  He presents for evaluation.   Past Medical History:  Diagnosis Date   Aortic stenosis 04/02/2021   Chronic diastolic heart failure (HCC)    CKD (chronic kidney disease) stage 2, GFR 60-89 ml/min 03/17/2012   Coronary artery disease, non-occlusive 03/18/2012   60-70% stenosis in RCA -- FFR 0.84; EF 60-65%   Diabetes mellitus without complication (HCC)    GERD (gastroesophageal reflux disease)    On PPI   History of GI bleed 02/2013   No obvious findings on EGD/colonoscopy   Hydrocele, bilateral    large on [place] 2020   Hyperlipidemia    Hypertension    Obesity (BMI 30.0-34.9) 10/02/2012   OSA (obstructive sleep apnea) 03/18/2012   Doing better with CPAP   Pulmonary hypertension ([place]) 12/10/2010   ECHO:  Mild PH,mild LVH; PA pressures estimated 30-40 mmHg   Pulmonary hypertension, unspecified ([place]) 06/04/2021   RBBB, 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   Event monitor; usually during sleeping hours; therefore not on beta blocker    Past Surgical History:  Procedure Laterality Date   [place]   Left eye   HEMORRHOID SURGERY     LEFT HEART CATH AND CORONARY ANGIOGRAPHY  03/27/2010   Moderate mid RCA lesion,right radial approach,normal EF   LEFT HEART CATHETERIZATION WITH CORONARY ANGIOGRAM N/A 03/17/2012    Procedure: LEFT HEART CATHETERIZATION WITH CORONARY ANGIOGRAM;  Surgeon: Leonie Man, MD;  Location: [facility] CATH LAB;  Service: Cardiovascular: Fractional Flow Reserve Measurement of mid RCA 60-70% stenosis = 0.84. Otherwise mild LCA CAD.  Normal EF & EDP   SHOULDER ARTHROSCOPY  2006   TRANSTHORACIC ECHOCARDIOGRAM  12/2010    Mild concentric LVH.  EF> 55%.  GR 1 DD.  Mild aortic sclerosis no stenosis.  PA pressures estimated 30-40 mmHg    Allergies  Allergen Reactions   Atorvastatin     myalgias    Current Outpatient Medications  Medication Sig Dispense Refill   albuterol (VENTOLIN HFA) 108 (90 Base) MCG/ACT inhaler Inhale 2 puffs into the lungs every 6 (six) hours as needed for wheezing or shortness of breath. 8 g 6   allopurinol (ZYLOPRIM) 100 MG tablet TAKE 1 TABLET BY MOUTH EVERY DAY 90 tablet 3   aspirin EC 81 MG tablet Take 1 tablet (81 mg total) by mouth daily. Swallow whole. 90 tablet 3   Blood Glucose Monitoring Suppl (FREESTYLE LITE) DEVI Use to check blood sugar once daily 1 each 0   carvedilol (COREG) 25 MG tablet Take 0.5 tablets (12.5 mg total) by mouth 2 (two) times daily with a meal. 180 tablet 1   Cholecalciferol (VITAMIN D-3) 1000 UNITS CAPS Take by mouth daily. 2 capsules.     dapagliflozin propanediol (FARXIGA) 10 MG TABS tablet Take 1 tablet (10 mg total) by mouth daily before breakfast. 90 tablet 3   doxazosin (CARDURA) 8 MG tablet Take 1 tablet (8 mg total) by mouth 2 (two) times daily. 180 tablet 3   ferrous sulfate 325 (65 FE) MG tablet Take 1 tablet (325 mg total) by mouth 2 (two) times daily with a meal. 180 tablet 3  fish oil-omega-3 fatty acids 1000 MG capsule Take 1,200 mg by mouth daily.      glucose blood (FREESTYLE LITE) test strip USE TO CHECK BLOOD SUGAR 1-2x DAILY. E11.59 200 strip 12   Multiple Vitamin (MULTIVITAMIN WITH MINERALS) TABS Take 1 tablet by mouth daily.     pantoprazole (PROTONIX) 40 MG tablet Take 1 tablet (40 mg total) by mouth daily. 30  tablet 1   PRESCRIPTION MEDICATION Uses C-PAP at bedtime     rosuvastatin (CRESTOR) 40 MG tablet Take 1 tablet (40 mg total) by mouth daily. 90 tablet 3   umeclidinium-vilanterol (ANORO ELLIPTA) 62.5-25 MCG/ACT AEPB Inhale 1 puff into the lungs daily. 7 each 0   valsartan (DIOVAN) 320 MG tablet TAKE 1 TABLET BY MOUTH EVERY DAY 90 tablet 1   No current facility-administered medications for this visit.    Socially He is from [place]; he is has 2 children 3 grandchildren. It smoked for over 40 years and quit smoking in March 2014. Does drink occasional alcohol. He does exercise. He is originally from [place]. He is looking [place] area for 4 years  ROS General: Negative; No fevers, chills, or night sweats;  HEENT: Negative; No changes in vision or hearing, sinus congestion, difficulty swallowing Pulmonary: Negative; No cough, wheezing, shortness of breath, hemoptysis Cardiovascular: Positive for hypertension, negative for chest pain PND orthopnea GI: Negative; No nausea, vomiting, diarrhea, or abdominal pain GU: Positive for urination at least 2 times per night. Musculoskeletal: Negative; no myalgias, joint pain, or weakness History of gout Hematologic/Oncology: Negative; no easy bruising, bleeding Endocrine: Negative; no heat/cold intolerance; no diabetes Neuro: Negative; no changes in balance, headaches Skin: Negative; No rashes or skin lesions Psychiatric: Negative; No behavioral problems, depression Sleep: Positive for severe obstructive sleep apnea on CPAP therapy.  No residual snoring or daytime sleepiness, hypersomnolence, bruxism, restless legs, hypnogognic hallucinations, no cataplexy Other comprehensive 14 point system review is negative.  PE BP 120/64   Pulse 61   Ht _0  (1.727 m)   Wt 208 lb 6.4 oz (94.5 kg)   SpO2 95%   BMI 31.69 kg/m    Repeat blood pressure by me was 132/64  Wt Readings from Last 3 Encounters:  12/09/21 208 lb 6.4 oz (94.5  kg)  11/20/21 205 lb (93 kg)  10/29/21 206 lb (93.4 kg)   General: Alert, oriented, no distress.  Skin: normal turgor, no rashes, warm and dry HEENT: Normocephalic, atraumatic. Pupils equal round and reactive to light; sclera anicteric; extraocular muscles intact;  Nose without nasal septal hypertrophy Mouth/Parynx benign; Mallinpatti scale 3 Neck: No JVD, no carotid bruits; normal carotid upstroke Lungs: clear to ausculatation and percussion; no wheezing or rales Chest wall: without tenderness to palpitation Heart: PMI not displaced, RRR, s1 s2 normal, 1/6 systolic murmur, no diastolic murmur, no rubs, gallops, thrills, or heaves Abdomen: soft, nontender; no hepatosplenomehaly, BS+; abdominal aorta nontender and not dilated by palpation. Back: no CVA tenderness Pulses 2+ Musculoskeletal: full range of motion, normal strength, no joint deformities Extremities: no clubbing cyanosis or edema, Homan's sign negative  Neurologic: grossly nonfocal; Cranial nerves grossly wnl Psychologic: Normal mood and affect   December 09, 2021 ECG (independently read by me):  Sinus rhythm at 61, PVC, RBBB; QTc 469 msec  November 19, 2020: ECG (independently read by me): Sinus bradycardia 52 bpm, right bundle branch block.  No ectopy  I personally reviewed the ECG from May 16, 2019 which revealed sinus bradycardia at 56 bpm,  right bundle branch block and inferior T wave abnormality.  August 2018 ECG (independently read by me): Sinus bradycardia at 54 bpm.  Right bundle-branch block with repolarization changes.  Normal intervals.  LABS:     Latest Ref Rng & Units 10/05/2021   11:48 PM 10/05/2021   11:55 AM 04/30/2021    8:23 AM  BMP  Glucose 70 - 99 mg/dL 184  159  111   BUN 8 - 23 mg/dL _0 Creatinine 0.61 - 1.24 mg/dL 1.78  1.80  1.82   BUN/Creat Ratio 6 - 22 (calc)   12   Sodium 135 - 145 mmol/L 140  143  140   Potassium 3.5 - 5.1 mmol/L 3.2  3.6  3.9   Chloride 98 - 111 mmol/L 108  111   109   CO2 22 - 32 mmol/L _1 Calcium 8.9 - 10.3 mg/dL 8.9  9.0  8.7        Latest Ref Rng & Units 10/05/2021   11:48 PM 04/30/2021    8:23 AM 12/25/2020    7:42 AM  Hepatic Function  Total Protein 6.5 - 8.1 g/dL 5.9  6.0  6.3   Albumin 3.5 - 5.0 g/dL 3.4   4.5   AST 15 - 41 U/L _2 ALT 0 - 44 U/L _3 Alk Phosphatase 38 - 126 U/L 78   102   Total Bilirubin 0.3 - 1.2 mg/dL 0.3  0.3  0.3        Latest Ref Rng & Units 10/05/2021   11:48 PM 10/05/2021   11:55 AM 10/16/2020    9:16 AM  CBC  WBC 4.0 - 10.5 K/uL 5.5  6.6  6.8   Hemoglobin 13.0 - 17.0 g/dL 11.9  12.3  13.4   Hematocrit 39.0 - 52.0 % 35.5  37.4  41.7   Platelets 150 - 400 K/uL 188  204  220     Lab Results  Component Value Date   MCV 82.4 10/05/2021   MCV 83.9 10/05/2021   MCV 85.6 10/16/2020    Lab Results  Component Value Date   TSH 1.740 10/02/2020   Lab Results  Component Value Date   HGBA1C 6.5 (A) 09/08/2021   Lipid Panel     Component Value Date/Time   CHOL 140 12/25/2020 0742   TRIG 138 12/25/2020 0742   HDL 49 12/25/2020 0742   CHOLHDL 2.9 12/25/2020 0742   CHOLHDL 2.8 10/16/2020 0916   VLDL 19 04/08/2016 0828   LDLCALC 67 12/25/2020 0742   LDLCALC 74 10/16/2020 0916    RADIOLOGY: No results found.  IMPRESSION:  1. Essential hypertension   2. OSA (obstructive sleep apnea)   3. Hyperlipidemia LDL goal <70   4. Stage 3b chronic kidney disease (HCC)   5. Obesity (BMI 30.0-34.9)     ASSESSMENT AND PLAN: James Jones is a 76 year-old African-American male who  has a history of obstructive sleep apnea for over 20 years.  He never used CPAP until 2014 and commenced after his evaluation at [facility], which revealed severe obstructive sleep apnea with an AHI of 69.3/h and during REM sleep 71.5 per hour.  At that time he had significantly loud snoring, frequent nocturia, and oxygen desaturation.   His initial CPAP machine was a [employer].  When I  saw  him in 2018, AHI was 1.4 at 18 cm water pressure.  I have not seen him in close to 3 years.  He  continued to use CPAP with excellent compliance and admits to almost 100% compliance with a rare instance where he has not used the machine.  I discussed with him optimal sleep duration of at least 7 to 8 hours if at all possible.  His old machine from 2013 started to malfunction.   When I saw him in May 2021 he qualified for a new machine and I recommended he  obtain a new ResMed AirSense 10 CPAP auto unit.  [employer] is his DME company who has bought out [facility].  When I saw him in August 2021 a  download was obtained on his new machine which initially was set at a minimum pressure of 5 and maximum pressure of 20 cm.  He is 100% compliant with average use is at 6 hours and 41 minutes.  AHI was 4.0.  There was no mask leak.  I at that time, recommended increasing his minimum pressure to start at 8 cm which should improve his AHI.  He is meeting compliance standards.  Over the year, he admits to 100% compliance with CPAP use.  On his most recent download from September 12 through November 18, 2020, he was averaging 6 hours and 44 minutes of CPAP use per night.  At his pressure range of 8 to 20 cm, AHI was 2.5.  At his October 2022 evaluation I made minor adjustments to his settings and changed his ramp time from 20 minutes down to 15 minutes, increasing his ramp start pressure to 6 cm of water, increasing EPR to 3, and will change his pressure range from 10 to 20 cm of water.  This should even further improve his treatment.  Only, he needs to have excellent compliance with CPAP use.  His most recent download from October 2 through December 08, 2021 shows average use of 7 hours and 36 minutes.  AHI is 2.8 and his 95th percentile pressure is 14.7 with maximum average of 17.1.  I will make further slight adjustment to his auto mode and change his settings to a range of 12 to 20 cm of water.  He  typically goes to bed between 9 and 10 PM and wakes up between 5 and 6 AM.  His blood pressure today is stable on his multiple medical regimen consisting of carvedilol 12.5 mg twice a day, doxazosin 8 mg twice a day, valsartan 320 mg daily, in addition to his Farxiga 10 mg.  He is on rosuvastatin 40 mg for hyperlipidemia and continues to be on an Anora Ellipta and albuterol as well as pantoprazole.  He will return to the cardiology care of Dr. Oval Linsey.  I discussed the importance of continued exercise and weight loss.  Presently he is exercising 3 days/week.  I will see him in 1 year for reevaluation or sooner as needed.   Troy Sine, MD, [facility]  12/14/2021 1:21 PM

## 2021-12-09 NOTE — Patient Instructions (Addendum)
Medication Instructions:  Continue same medications *If you need a refill on your cardiac medications before your next appointment, please call your pharmacy*   Lab Work: None ordered   Testing/Procedures: None ordered   Follow-Up: At [facility], you and your health needs are our priority.  As part of our continuing mission to provide you with exceptional heart care, we have created designated Provider Care Teams.  These Care Teams include your primary Cardiologist (physician) and Advanced Practice Providers (APPs -  Physician Assistants and Nurse Practitioners) who all work together to provide you with the care you need, when you need it.  We recommend signing up for the patient portal called "MyChart".  Sign up information is provided on this After Visit Summary.  MyChart is used to connect with patients for Virtual Visits (Telemedicine).  Patients are able to view lab/test results, encounter notes, upcoming appointments, etc.  Non-urgent messages can be sent to your provider as well.   To learn more about what you can do with MyChart, go to [email or web address].    Your next appointment:  1 year    Call in July to schedule Nov appointment     The format for your next appointment: Office    Provider:  Dr.Kelly   Important Information About Sugar       

## 2021-12-14 ENCOUNTER — Encounter: Payer: Self-pay | Admitting: Cardiovascular Disease

## 2021-12-15 ENCOUNTER — Other Ambulatory Visit: Payer: Self-pay | Admitting: Internal Medicine

## 2022-01-12 DIAGNOSIS — E1129 Type 2 diabetes mellitus with other diabetic kidney complication: Secondary | ICD-10-CM | POA: Diagnosis not present

## 2022-01-12 DIAGNOSIS — N1832 Chronic kidney disease, stage 3b: Secondary | ICD-10-CM | POA: Diagnosis not present

## 2022-01-21 DIAGNOSIS — I272 Pulmonary hypertension, unspecified: Secondary | ICD-10-CM | POA: Diagnosis not present

## 2022-01-21 DIAGNOSIS — E872 Acidosis, unspecified: Secondary | ICD-10-CM | POA: Diagnosis not present

## 2022-01-21 DIAGNOSIS — R809 Proteinuria, unspecified: Secondary | ICD-10-CM | POA: Diagnosis not present

## 2022-01-21 DIAGNOSIS — N1831 Chronic kidney disease, stage 3a: Secondary | ICD-10-CM | POA: Diagnosis not present

## 2022-01-21 DIAGNOSIS — E1122 Type 2 diabetes mellitus with diabetic chronic kidney disease: Secondary | ICD-10-CM | POA: Diagnosis not present

## 2022-01-21 DIAGNOSIS — E1129 Type 2 diabetes mellitus with other diabetic kidney complication: Secondary | ICD-10-CM | POA: Diagnosis not present

## 2022-01-21 DIAGNOSIS — K219 Gastro-esophageal reflux disease without esophagitis: Secondary | ICD-10-CM | POA: Diagnosis not present

## 2022-01-21 DIAGNOSIS — K0889 Other specified disorders of teeth and supporting structures: Secondary | ICD-10-CM | POA: Diagnosis not present

## 2022-01-21 DIAGNOSIS — I129 Hypertensive chronic kidney disease with stage 1 through stage 4 chronic kidney disease, or unspecified chronic kidney disease: Secondary | ICD-10-CM | POA: Diagnosis not present

## 2022-02-03 DIAGNOSIS — M79641 Pain in right hand: Secondary | ICD-10-CM | POA: Insufficient documentation

## 2022-02-16 DIAGNOSIS — M67431 Ganglion, right wrist: Secondary | ICD-10-CM | POA: Insufficient documentation

## 2022-02-20 ENCOUNTER — Other Ambulatory Visit: Payer: Self-pay | Admitting: Family Medicine

## 2022-02-22 ENCOUNTER — Telehealth: Payer: Self-pay | Admitting: Family Medicine

## 2022-02-22 ENCOUNTER — Other Ambulatory Visit: Payer: Self-pay | Admitting: Family Medicine

## 2022-02-22 NOTE — Telephone Encounter (Signed)
Prescription Request  02/22/2022  Is this a "Controlled Substance" medicine? No  LOV: Visit date not found  What is the name of the medication or equipment?  **NEW SCRIPT NEEDED; PREVIOUS SCRIPT DISCONTINUED (SEE BELOW)  amLODipine (NORVASC) 10 MG tablet [[id number]]  DISCONTINUED   Have you contacted your pharmacy to request a refill? Yes   Which pharmacy would you like this sent to?  [id number] [address] [place], [place] [address] [place] [address] Phone: [phone] Fax: [phone]    Patient notified that their request is being sent to the clinical staff for review and that they should receive a response within 2 business days.   Please advise pharmacist at [phone].

## 2022-02-22 NOTE — Telephone Encounter (Signed)
Unable to refill per protocol, Rx request is too soon. Last refill 05/07/21 for 90 and 3 refills. Will refuse.  Requested Prescriptions  Pending Prescriptions Disp Refills   allopurinol (ZYLOPRIM) 100 MG tablet [Pharmacy Med Name: ALLOPURINOL 100 MG TABLET] 90 tablet 3    Sig: TAKE 1 TABLET BY [place] DAY     Endocrinology:  Gout Agents - allopurinol Failed - 02/20/2022 10:55 AM      Failed - Uric Acid in normal range and within 360 days    Uric Acid, Serum  Date Value Ref Range Status  02/19/2019 5.7 4.0 - 8.0 mg/dL Final    Comment:    Therapeutic target for gout patients: <6.0 mg/dL .          Failed - Cr in normal range and within 360 days    Creat  Date Value Ref Range Status  04/30/2021 1.82 (H) 0.70 - 1.28 mg/dL Final   Creatinine, Ser  Date Value Ref Range Status  10/05/2021 1.78 (H) 0.61 - 1.24 mg/dL Final   Creatinine, Urine  Date Value Ref Range Status  04/11/2020 53 20 - 320 mg/dL Final         Passed - Valid encounter within last 12 months    Recent Outpatient Visits           9 months ago Benign essential HTN   [place] Pickard, Cammie Mcgee, MD   1 year ago Benign essential HTN   [place] Susy Frizzle, MD   1 year ago Urinary frequency   [place] Susy Frizzle, MD   1 year ago General medical exam   [place] Susy Frizzle, MD   2 years ago Left sided sciatica   [place] Pickard, Cammie Mcgee, MD              Passed - CBC within normal limits and completed in the last 12 months    WBC  Date Value Ref Range Status  10/05/2021 5.5 4.0 - 10.5 K/uL Final   RBC  Date Value Ref Range Status  10/05/2021 4.31 4.22 - 5.81 MIL/uL Final   Hemoglobin  Date Value Ref Range Status  10/05/2021 11.9 (L) 13.0 - 17.0 g/dL Final   HCT  Date Value Ref Range Status  10/05/2021 35.5 (L) 39.0 - 52.0 % Final   MCHC  Date Value Ref Range Status   10/05/2021 33.5 30.0 - 36.0 g/dL Final   [facility]  Date Value Ref Range Status  10/05/2021 27.6 26.0 - 34.0 pg Final   MCV  Date Value Ref Range Status  10/05/2021 82.4 80.0 - 100.0 fL Final   No results found for: "PLTCOUNTKUC", "LABPLAT", "POCPLA" RDW  Date Value Ref Range Status  10/05/2021 13.9 11.5 - 15.5 % Final

## 2022-02-23 ENCOUNTER — Other Ambulatory Visit: Payer: Self-pay | Admitting: Family Medicine

## 2022-02-23 MED ORDER — AMLODIPINE BESYLATE 10 MG PO TABS: 10.0000 mg | ORAL_TABLET | Freq: Every day | ORAL | 3 refills | Status: DC

## 2022-03-08 ENCOUNTER — Other Ambulatory Visit: Payer: Self-pay | Admitting: Family Medicine

## 2022-03-11 ENCOUNTER — Ambulatory Visit: Payer: Medicare Other | Admitting: Internal Medicine

## 2022-04-04 ENCOUNTER — Other Ambulatory Visit: Payer: Self-pay | Admitting: Family Medicine

## 2022-04-07 ENCOUNTER — Other Ambulatory Visit: Payer: Self-pay | Admitting: Internal Medicine

## 2022-04-07 ENCOUNTER — Other Ambulatory Visit: Payer: Self-pay | Admitting: Family Medicine

## 2022-04-22 ENCOUNTER — Ambulatory Visit (INDEPENDENT_AMBULATORY_CARE_PROVIDER_SITE_OTHER): Payer: Medicare Other | Admitting: Internal Medicine

## 2022-04-22 ENCOUNTER — Encounter: Payer: Self-pay | Admitting: Internal Medicine

## 2022-04-22 VITALS — BP 138/82 | HR 60 | Ht 68.0 in | Wt 202.8 lb

## 2022-04-22 DIAGNOSIS — Z6830 Body mass index (BMI) 30.0-30.9, adult: Secondary | ICD-10-CM

## 2022-04-22 DIAGNOSIS — E785 Hyperlipidemia, unspecified: Secondary | ICD-10-CM | POA: Diagnosis not present

## 2022-04-22 DIAGNOSIS — E669 Obesity, unspecified: Secondary | ICD-10-CM | POA: Diagnosis not present

## 2022-04-22 DIAGNOSIS — E1159 Type 2 diabetes mellitus with other circulatory complications: Secondary | ICD-10-CM | POA: Diagnosis not present

## 2022-04-22 LAB — LIPID PANEL
Cholesterol: 156 mg/dL (ref 0–200)
HDL: 63.3 mg/dL (ref >39.00)
LDL Cholesterol: 55 mg/dL (ref 0–99)
NonHDL: 92.89
Total CHOL/HDL Ratio: 2
Triglycerides: 189 mg/dL — ABNORMAL HIGH (ref 0.0–149.0)
VLDL: 37.8 mg/dL (ref 0.0–40.0)

## 2022-04-22 LAB — POCT GLYCOSYLATED HEMOGLOBIN (HGB A1C): Hemoglobin A1C: 6.9 % — AB (ref 4.0–5.6)

## 2022-04-22 NOTE — Patient Instructions (Addendum)
Please continue: - Farxiga 10 mg before b'fast.  Please return in 4 months with your sugar log.

## 2022-04-22 NOTE — Progress Notes (Addendum)
Patient ID: James Jones, male   DOB: 1945/12/12, 77 y.o.   MRN: [id number]  HPI: James Jones is a 77 y.o.-year-old male, returning for f/u for DM2, dx in ~2005, non-insulin-dependent, lately more controlled, with complications (CAD, CKD). Last visit 7 months ago. PCP: Dr. Jenna Luo  Interim history: No blurry vision, nausea, chest pain.  He has occasional numbness in his toes.  Reviewed HbA1c levels: Lab Results  Component Value Date   HGBA1C 6.5 (A) 09/08/2021   HGBA1C 6.7 (H) 04/30/2021   HGBA1C 6.6 (A) 03/17/2021  04/11/2014: HbA1c 8.4% 01/08/2014: HbA1c 7.7% He had steroid inj for gout in his elbows - 12/2013. He also had a steroid inj in knee fall 2016, too.   He is on: - Farxiga 10 mg daily - started by PCP - started 1 mo ago - very expensive (600$ for 3 mo >> now cheaper) We had to stop Januvia since last visit due to price.  Tradjenta was also not covered. Previously on low-dose Metformin ER, but stopped 07/2018 due to CKD Had nausea, vomiting, loss of appetite with regular metformin.  He was on Actos. He was on insulin before (Lantus) - came off years ago. We stopped glipizide ER 2.5 mg daily in 09/2021.  Pt checks his sugars 1-2 times a day,: - am: 97-131, 136 (most 100-120) >> 91-122, 134 (pasta)>> 106-123 - 2h after b'fast: 119, 160 >> n/c >> 119, 133 >> n/c >> 118 >> n/c - before lunch: 122, 140 > 109-148 >> 133 >> 154 >> 94 >> 89-134 - 2h after lunch: n/c >> 110 >> 124-179 >> 99 >> 65, 78-120>> 102-149 - before dinner:  n/c >> 91, 151 >> 91 >> 82 >> n/c >> 93-97, 175 (grapes) - 2h after dinner: n/c >> 107, 164 >> 118, 128 >> 112, 131 >> n/c >> 187 - bedtime: n/c >> 109-142, 187 >> 98-139 >> 104 >> 103-150 >> 88, 172 - nighttimen/c >> 100 >> n/c  >> 122 >> n/c Lowest 54 x1 >>... 91 >> 82>> 65 >> 88; he has hypoglycemia awareness in the 70s. Highest sugar was 285 (Prednisone) >> .Marland Kitchen. 179 >> 154 >> 150 >> 187  Glucometer: Freestyle [employer] did not cover  Freestyle Libre CGM.  Pt's meals are: - Breakfast: bacon + eggs, sausage, grits, oatmeal, cereal, toast wheat - Lunch: BLT, soups, fruit, nuts - Dinner: chicken, pork + greens, rice  - Snacks: 1 a day: pretzels; carrots  Previously exercising by walking several times a week, but stopped exercising due to the coronavirus pandemic. Walking 6 mi MWF at the [facility].  -+ CKD-sees nephrology (renal ultrasound showed renal cysts), latest BUN/creatinine:  Lab Results  Component Value Date   BUN 14 10/05/2021   CREATININE 1.78 (H) 10/05/2021  05/24/2014: 34/1.64 01/08/2014: 18/1.15 On valsartan.  -+ HL; last set of lipids: Lab Results  Component Value Date   CHOL 140 12/25/2020   HDL 49 12/25/2020   LDLCALC 67 12/25/2020   TRIG 138 12/25/2020   CHOLHDL 2.9 12/25/2020  On rosuvastatin 40, fish oil.  He still has some muscle aches, improved.  He had more muscle aches when he was on pravastatin 40 before.  - last eye exam was in 04/2021: No DR.  He has history of cataract surgery in 2016 and 17. Coming up next week.  - no numbness and tingling in his feet. + Occasional pain in hands and feet. Last foot exam 04/2021.  Latest TSH was normal: Lab Results  Component  Value Date   TSH 1.740 10/02/2020   ROS: + see HPI  I reviewed pt's medications, allergies, PMH, social hx, family hx, and changes were documented in the history of present illness. Otherwise, unchanged from my initial visit note.  Past Medical History:  Diagnosis Date   Aortic stenosis 04/02/2021   Chronic diastolic heart failure (HCC)    CKD (chronic kidney disease) stage 2, GFR 60-89 ml/min 03/17/2012   Coronary artery disease, non-occlusive 03/18/2012   60-70% stenosis in RCA -- FFR 0.84; EF 60-65%   Diabetes mellitus without complication (HCC)    GERD (gastroesophageal reflux disease)    On PPI   History of GI bleed 02/2013   No obvious findings on EGD/colonoscopy   Hydrocele, bilateral    large on [place] 2020    Hyperlipidemia    Hypertension    Obesity (BMI 30.0-34.9) 10/02/2012   OSA (obstructive sleep apnea) 03/18/2012   Doing better with CPAP   Pulmonary hypertension ([place]) 12/10/2010   ECHO:  Mild PH,mild LVH; PA pressures estimated 30-40 mmHg   Pulmonary hypertension, unspecified ([place]) 06/04/2021   RBBB, 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   Event monitor; usually during sleeping hours; therefore not on beta blocker  + Gout  Past Surgical History:  Procedure Laterality Date   [place]   Left eye   HEMORRHOID SURGERY     LEFT HEART CATH AND CORONARY ANGIOGRAPHY  03/27/2010   Moderate mid RCA lesion,right radial approach,normal EF   LEFT HEART CATHETERIZATION WITH CORONARY ANGIOGRAM N/A 03/17/2012   Procedure: LEFT HEART CATHETERIZATION WITH CORONARY ANGIOGRAM;  Surgeon: Leonie Man, MD;  Location: [facility] C[facility] LAB;  Service: Cardiovascular: Fractional Flow Reserve Measurement of mid RCA 60-70% stenosis = 0.84. Otherwise mild LCA CAD.  Normal EF & EDP   SHOULDER ARTHROSCOPY  2006   TRANSTHORACIC ECHOCARDIOGRAM  12/2010    Mild concentric LVH.  EF> 55%.  GR 1 DD.  Mild aortic sclerosis no stenosis.  PA pressures estimated 30-40 mmHg   History   Social History   Marital Status: Married    Spouse Name: N/A   Occupational History   retired   Social History Main Topics   Smoking status: Former Smoker -- 2.00 packs/day    Quit date: 04/12/2012   Smokeless tobacco: Never Used   Alcohol Use: 0.5 oz/week    1 drink(s) per week   Drug Use: Yes    Special: Marijuana     Comment: cannibus   Social History Narrative   Married, father of 2, grandfather of 3.   He is a former smoker of about a pack to pack and half cigarettes a day -- he quit in March of this year.    He is an avid exerciser working at least 4-5 days a week doing her walking or stationary bike.   Current Outpatient Medications on File Prior to Visit   Medication Sig Dispense Refill   amLODipine (NORVASC) 10 MG tablet Take 1 tablet (10 mg total) by mouth daily. 90 tablet 3   albuterol (VENTOLIN HFA) 108 (90 Base) MCG/ACT inhaler Inhale 2 puffs into the lungs every 6 (six) hours as needed for wheezing or shortness of breath. 8 g 6   allopurinol (ZYLOPRIM) 100 MG tablet TAKE 1 TABLET BY MOUTH EVERY DAY 90 tablet 3   aspirin EC 81 MG tablet Take 1 tablet (81 mg total) by mouth daily. Swallow  whole. 90 tablet 3   Blood Glucose Monitoring Suppl (FREESTYLE LITE) DEVI Use to check blood sugar once daily 1 each 0   carvedilol (COREG) 25 MG tablet TAKE 1 TABLET BY MOUTH TWICE A DAY 60 tablet 0   Cholecalciferol (VITAMIN D-3) 1000 UNITS CAPS Take by mouth daily. 2 capsules.     dapagliflozin propanediol (FARXIGA) 10 MG TABS tablet TAKE 1 TABLET BY MOUTH DAILY BEFORE BREAKFAST. 90 tablet 1   doxazosin (CARDURA) 8 MG tablet Take 1 tablet (8 mg total) by mouth 2 (two) times daily. 180 tablet 3   ferrous sulfate 325 (65 FE) MG tablet Take 1 tablet (325 mg total) by mouth 2 (two) times daily with a meal. 180 tablet 3   fish oil-omega-3 fatty acids 1000 MG capsule Take 1,200 mg by mouth daily.      glucose blood (FREESTYLE LITE) test strip USE TO CHECK BLOOD SUGAR 1-2X DAILY. E11.59 200 strip 3   Multiple Vitamin (MULTIVITAMIN WITH MINERALS) TABS Take 1 tablet by mouth daily.     pantoprazole (PROTONIX) 40 MG tablet Take 1 tablet (40 mg total) by mouth daily. 30 tablet 1   PRESCRIPTION MEDICATION Uses C-PAP at bedtime     rosuvastatin (CRESTOR) 40 MG tablet Take 1 tablet (40 mg total) by mouth daily. 90 tablet 3   umeclidinium-vilanterol (ANORO ELLIPTA) 62.5-25 MCG/ACT AEPB Inhale 1 puff into the lungs daily. 7 each 0   valsartan (DIOVAN) 320 MG tablet TAKE 1 TABLET BY MOUTH EVERY DAY 90 tablet 1   No current facility-administered medications on file prior to visit.   Allergies  Allergen Reactions   Atorvastatin     myalgias   Family History  Problem  Relation Age of Onset   Hypertension Mother    Kidney failure Mother    Coronary artery disease Father    Hypertension Sister    Kidney failure Sister    Stroke Maternal Grandmother    PE: BP 138/82 (BP Location: Left Arm, Patient Position: Sitting, Cuff Size: Normal)   Pulse 60   Ht '5\' 8"'$  (1.727 m)   Wt 202 lb 12.8 oz (92 kg)   SpO2 93%   BMI 30.84 kg/m   Wt Readings from Last 3 Encounters:  04/22/22 202 lb 12.8 oz (92 kg)  12/09/21 208 lb 6.4 oz (94.5 kg)  11/20/21 205 lb (93 kg)   Constitutional: overweight, in NAD Eyes: EOMI, no exophthalmos ENT: no thyromegaly, no cervical lymphadenopathy Cardiovascular: RRR, No RG, + 2/6 SEM Respiratory: CTA B Musculoskeletal: no deformities Skin: no rashes Neurological: no tremor with outstretched hands Diabetic Foot Exam - Simple   Simple Foot Form Diabetic Foot exam was performed with the following findings: Yes 04/22/2022 11:31 AM  Visual Inspection No deformities, no ulcerations, no other skin breakdown bilaterally: Yes Sensation Testing Intact to touch and monofilament testing bilaterally: Yes Pulse Check Posterior Tibialis and Dorsalis pulse intact bilaterally: Yes Comments    ASSESSMENT: 1. DM2, non-insulin-dependent, uncontrolled, with complications - CAD - cardiologist Dr. Ellyn Hack - CKD - was seeing Dr Lowanda Foster >> Dr. Harrie Jeans  2. HL  3.  Obesity class I  4. HTN  PLAN:  1. Patient with longstanding, previously uncontrolled, type 2 diabetes, with much improved control in the last several years.  At last visit, he was on sulfonylurea, low-dose, and SGLT2 inhibitor.  He was on a DPP 4 inhibitor in the past.  We cannot use metformin for him due to his CKD.  Wilder Glade is now  affordable for him. -At last visit, sugars were at goal in the morning and later during the day but he did have slightly lower blood sugars in the middle of the night.  I advised him to stop sulfonylurea and continue to [place] only.  I advised him  to stay well-hydrated while on this.  HbA1c at last visit was lower, at 6.5%. -At today's visit, sugars are mostly at goal with only occasional mild hyperglycemic spikes.  For now, I do not see a reason to restart his glipizide, but I would like to keep a closer eye on his blood sugars.  Therefore, I will have him continue [place] and return to see me in follow-up, rather than 6 months. - I suggested to:  Patient Instructions  Please continue: - Farxiga 10 mg before b'fast.  Please return in 4 months with your sugar log.   - we checked his HbA1c: 6.9% (higher) - advised to check sugars at different times of the day - 1x a day, rotating check times - advised for yearly eye exams >> he is UTD - return to clinic in 4 months  2. HL -Reviewed latest lipid panel from 12/2020: LDL above our target of less than 55 due to history of cardiovascular disease, otherwise lipids at goal: Lab Results  Component Value Date   CHOL 140 12/25/2020   HDL 49 12/25/2020   LDLCALC 67 12/25/2020   TRIG 138 12/25/2020   CHOLHDL 2.9 12/25/2020  -He had myalgias on atorvastatin and also on rosuvastatin, but he tolerates well Crestor 40 mg daily -Discussed in the past about cutting down eggs.  He was eating 2 every day. -Will recheck his lipid panel today  3. Obesity class I -Continues on an SGLT2 inhibitor, which should also help with weight loss -We discussed in the past about cutting down on fatty foods -He lost 9 pounds before the last 3 visits combined -He lost 6 more pounds since last visit.   Component     Latest Ref Rng 04/22/2022  Cholesterol     0 - 200 mg/dL 156   HDL Cholesterol     >39.00 mg/dL 63.30   Triglycerides     0.0 - 149.0 mg/dL 189.0 (H)   Total CHOL/HDL Ratio 2   Hemoglobin A1C     4.0 - 5.6 % 6.9 !   VLDL     0.0 - 40.0 mg/dL 37.8   LDL (calc)     0 - 99 mg/dL 55   NonHDL 92.89   LDL at target, triglycerides slightly high.  For now, we can continue the current  regimen.  Philemon Kingdom, MD PhD [facility]

## 2022-04-29 ENCOUNTER — Ambulatory Visit: Payer: Medicare Other

## 2022-04-29 ENCOUNTER — Telehealth: Payer: Self-pay | Admitting: Pharmacist

## 2022-04-29 VITALS — Ht 68.0 in | Wt 202.0 lb

## 2022-04-29 DIAGNOSIS — Z0000 Encounter for general adult medical examination without abnormal findings: Secondary | ICD-10-CM | POA: Diagnosis not present

## 2022-04-29 NOTE — Progress Notes (Signed)
Subjective:   James Jones is a 77 y.o. male who presents for Medicare Annual/Subsequent preventive examination.  I connected with  James Jones on 04/29/22 by a audio enabled telemedicine application and verified that I am speaking with the correct person using two identifiers.  Patient Location: Home  Provider Location: Office/Clinic  I discussed the limitations of evaluation and management by telemedicine. The patient expressed understanding and agreed to proceed.  Review of Systems     Cardiac Risk Factors include: advanced age (>57men, >58 women);diabetes mellitus;dyslipidemia;male gender;hypertension     Objective:    Today's Vitals   04/29/22 0941  Weight: 202 lb (91.6 kg)  Height: 5\' 8"  (1.727 m)   Body mass index is 30.71 kg/m.     04/29/2022    1:40 PM 10/05/2021   11:50 AM 04/23/2021    9:24 AM 10/05/2017   12:01 PM 11/27/2015   11:19 AM 03/17/2012   11:27 PM  Advanced Directives  Does Patient Have a Medical Advance Directive? No No No No No Patient does not have advance directive  Would patient like information on creating a medical advance directive? No - Patient declined No - Patient declined No - Patient declined     Pre-existing out of facility DNR order (yellow form or pink MOST form)      No    Current Medications (verified) Outpatient Encounter Medications as of 04/29/2022  Medication Sig   albuterol (VENTOLIN HFA) 108 (90 Base) MCG/ACT inhaler Inhale 2 puffs into the lungs every 6 (six) hours as needed for wheezing or shortness of breath.   allopurinol (ZYLOPRIM) 100 MG tablet TAKE 1 TABLET BY MOUTH EVERY DAY   amLODipine (NORVASC) 10 MG tablet Take 1 tablet (10 mg total) by mouth daily.   aspirin EC 81 MG tablet Take 1 tablet (81 mg total) by mouth daily. Swallow whole.   Blood Glucose Monitoring Suppl (FREESTYLE LITE) DEVI Use to check blood sugar once daily   carvedilol (COREG) 25 MG tablet TAKE 1 TABLET BY MOUTH TWICE A DAY   Cholecalciferol  (VITAMIN D-3) 1000 UNITS CAPS Take by mouth daily. 2 capsules.   dapagliflozin propanediol (FARXIGA) 10 MG TABS tablet TAKE 1 TABLET BY MOUTH DAILY BEFORE BREAKFAST.   doxazosin (CARDURA) 8 MG tablet Take 1 tablet (8 mg total) by mouth 2 (two) times daily.   ferrous sulfate 325 (65 FE) MG tablet Take 1 tablet (325 mg total) by mouth 2 (two) times daily with a meal.   fish oil-omega-3 fatty acids 1000 MG capsule Take 1,200 mg by mouth daily.    glucose blood (FREESTYLE LITE) test strip USE TO CHECK BLOOD SUGAR 1-2X DAILY. E11.59   Multiple Vitamin (MULTIVITAMIN WITH MINERALS) TABS Take 1 tablet by mouth daily.   omeprazole (PRILOSEC) 20 MG capsule Take 20 mg by mouth daily.   PRESCRIPTION MEDICATION Uses C-PAP at bedtime   rosuvastatin (CRESTOR) 40 MG tablet Take 1 tablet (40 mg total) by mouth daily.   sodium bicarbonate 650 MG tablet Take 650 mg by mouth daily.   umeclidinium-vilanterol (ANORO ELLIPTA) 62.5-25 MCG/ACT AEPB Inhale 1 puff into the lungs daily.   valsartan (DIOVAN) 320 MG tablet TAKE 1 TABLET BY MOUTH EVERY DAY   [DISCONTINUED] pantoprazole (PROTONIX) 40 MG tablet Take 1 tablet (40 mg total) by mouth daily.   No facility-administered encounter medications on file as of 04/29/2022.    Allergies (verified) Atorvastatin   History: Past Medical History:  Diagnosis Date   Aortic stenosis 04/02/2021  Chronic diastolic heart failure (HCC)    CKD (chronic kidney disease) stage 2, GFR 60-89 ml/min 03/17/2012   Coronary artery disease, non-occlusive 03/18/2012   60-70% stenosis in RCA -- FFR 0.84; EF 60-65%   Diabetes mellitus without complication (HCC)    GERD (gastroesophageal reflux disease)    On PPI   History of GI bleed 02/2013   No obvious findings on EGD/colonoscopy   Hydrocele, bilateral    large on [place] 2020   Hyperlipidemia    Hypertension    Obesity (BMI 30.0-34.9) 10/02/2012   OSA (obstructive sleep apnea) 03/18/2012   Doing better with CPAP   Pulmonary  hypertension ([place]) 12/10/2010   ECHO:  Mild PH,mild LVH; PA pressures estimated 30-40 mmHg   Pulmonary hypertension, unspecified ([place]) 06/04/2021   RBBB, 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   Event monitor; usually during sleeping hours; therefore not on beta blocker   Past Surgical History:  Procedure Laterality Date   [place]   Left eye   HEMORRHOID SURGERY     LEFT HEART CATH AND CORONARY ANGIOGRAPHY  03/27/2010   Moderate mid RCA lesion,right radial approach,normal EF   LEFT HEART CATHETERIZATION WITH CORONARY ANGIOGRAM N/A 03/17/2012   Procedure: LEFT HEART CATHETERIZATION WITH CORONARY ANGIOGRAM;  Surgeon: Leonie Man, MD;  Location: [facility] CATH LAB;  Service: Cardiovascular: Fractional Flow Reserve Measurement of mid RCA 60-70% stenosis = 0.84. Otherwise mild LCA CAD.  Normal EF & EDP   SHOULDER ARTHROSCOPY  2006   TRANSTHORACIC ECHOCARDIOGRAM  12/2010    Mild concentric LVH.  EF> 55%.  GR 1 DD.  Mild aortic sclerosis no stenosis.  PA pressures estimated 30-40 mmHg   Family History  Problem Relation Age of Onset   Hypertension Mother    Kidney failure Mother    Coronary artery disease Father    Hypertension Sister    Kidney failure Sister    Stroke Maternal Grandmother    Social History   Socioeconomic History   Marital status: Married    Spouse name: Not on file   Number of children: Not on file   Years of education: Not on file   Highest education level: Not on file  Occupational History   Not on file  Tobacco Use   Smoking status: Former    Packs/day: 2.00    Years: 50.00    Additional pack years: 0.00    Total pack years: 100.00    Types: Cigarettes    Quit date: 10/09/2020    Years since quitting: 1.5   Smokeless tobacco: Never  Vaping Use   Vaping Use: Never used  Substance and Sexual Activity   Alcohol use: Yes    Alcohol/week: 1.0 standard drink of alcohol    Types: 1 Standard  drinks or equivalent per week   Drug use: Yes    Types: Marijuana    Comment: cannibus, occ use   Sexual activity: Not on file  Other Topics Concern   Not on file  Social History Narrative   Married, father of 2, grandfather of 3.   He is a former smoker of about a pack to pack and half cigarettes a day -- he quit in March of this year.    He is an avid exerciser working at least 4-5 days a week doing her walking or stationary bike.   Social Determinants of Health   Financial Resource Strain: Low Risk  (  04/29/2022)   Overall Financial Resource Strain (CARDIA)    Difficulty of Paying Living Expenses: Not hard at all  Food Insecurity: No Food Insecurity (04/29/2022)   Hunger Vital Sign    Worried About Running Out of Food in the Last Year: Never true    Ran Out of Food in the Last Year: Never true  Transportation Needs: No Transportation Needs (04/29/2022)   PRAPARE - Hydrologist (Medical): No    Lack of Transportation (Non-Medical): No  Physical Activity: Sufficiently Active (04/29/2022)   Exercise Vital Sign    Days of Exercise per Week: 5 days    Minutes of Exercise per Session: 40 min  Stress: No Stress Concern Present (04/29/2022)   [place]    Feeling of Stress : Not at all  Social Connections: [place] (04/29/2022)   Social Connection and Isolation Panel [NHANES]    Frequency of Communication with Friends and Family: More than three times a week    Frequency of Social Gatherings with Friends and Family: More than three times a week    Attends Religious Services: More than 4 times per year    Active Member of [employer] or Organizations: Yes    Attends Music therapist: More than 4 times per year    Marital Status: Married    Tobacco Counseling Counseling given: Not Answered   Clinical Intake:  Pre-visit preparation completed: Yes  Pain : No/denies  pain  Diabetes: No  How often do you need to have someone help you when you read instructions, pamphlets, or other written materials from your doctor or pharmacy?: 1 - Never  Diabetic?Yes   Nutrition Risk Assessment:  Has the patient had any N/V/D within the last 2 months?  No  Does the patient have any non-healing wounds?  No  Has the patient had any unintentional weight loss or weight gain?  No   Diabetes:  Is the patient diabetic?  Yes  If diabetic, was a CBG obtained today?  No  Did the patient bring in their glucometer from home?  No  How often do you monitor your CBG's? daily.   Financial Strains and Diabetes Management:  Are you having any financial strains with the device, your supplies or your medication? Yes . Cost of [place] is over $500 for a 90 day supply  Does the patient want to be seen by Chronic Care Management for management of their diabetes?  No  Would the patient like to be referred to a Nutritionist or for Diabetic Management?  No   Diabetic Exams:  Diabetic Eye Exam: Completed 04/29/21 Diabetic Foot Exam: Completed 04/22/22   Interpreter Needed?: No  Information entered by :: Denman George LPN   Activities of Daily Living    04/29/2022    1:41 PM  In your present state of health, do you have any difficulty performing the following activities:  Hearing? 0  Vision? 0  Difficulty concentrating or making decisions? 0  Walking or climbing stairs? 0  Dressing or bathing? 0  Doing errands, shopping? 0  Preparing Food and eating ? N  Using the Toilet? N  In the past six months, have you accidently leaked urine? N  Do you have problems with loss of bowel control? N  Managing your Medications? N  Managing your Finances? N  Housekeeping or managing your Housekeeping? N    Patient Care Team: Susy Frizzle, MD as PCP -  General (Family Medicine) Skeet Latch, MD as PCP - Cardiology (Cardiology) Edythe Clarity, [facility] as Pharmacist  (Pharmacist)  Indicate any recent Medical Services you may have received from other than [facility] providers in the past year (date may be approximate).     Assessment:   This is a routine wellness examination for [place].  Hearing/Vision screen Hearing Screening - Comments:: Denies hearing difficulties  Vision Screening - Comments:: Wears rx glasses - up to date with routine eye exams with Dr. Satira Sark    Dietary issues and exercise activities discussed: Current Exercise Habits: Home exercise routine, Type of exercise: walking, Time (Minutes): 40, Frequency (Times/Week): 5, Weekly Exercise (Minutes/Week): 200, Intensity: Mild   Goals Addressed             This Visit's Progress    COMPLETED: CARE PLAN       CARE PLAN ENTRY  Current Barriers:  Chronic Disease Management support, education, and care coordination needs related to Hypertension, Hyperlipidemia, and Diabetes   Hypertension Pharmacist Clinical Goal(s): Over the next 60 days, patient will work with PharmD and providers to achieve BP goal <130/80 Current regimen:  Amlodipine 10mg  daily, atenolol 50mg  daily, valsartan 320mg  daily Interventions: Continue current therapy Recommend comparison of home monitor with office monitor Recommend visit with PCP for BP eval in office Monitor BP daily Patient self care activities - Over the next 60 days, patient will: Check BP once daily, document, and provide at future appointments Ensure daily salt intake < 2300 mg/day Report consistent BP readings > 130/80 to PharmD or PCP.  Hyperlipidemia Pharmacist Clinical Goal(s): Over the next 60 days, patient will work with PharmD and providers to achieve LDL goal < 70. Current regimen:  Rosuvastatin 20mg  daily Interventions: Continue current therapy RTC for lipid panel ASAP Patient self care activities - Over the next 60 days, patient will: Make appointment to have lipids rechecked to assess current medication effectiveness Notify  PharmD or PCP of any worsening muscle pain  Diabetes Pharmacist Clinical Goal(s): Over the next 30 days, patient will work with PharmD and providers to maintain A1c goal <7% Current regimen:  Glipizide XL 2.5mg  daily Interventions: Continue current therapy Counseled on diet and importance of eating around all doses of medications that lower blood sugar. Discussed availability of Januvia PAP Patient self care activities - Over the next 30 days, patient will: Check blood sugar in the morning before eating or drinking, document, and provide at future appointments Contact provider with any episodes of hypoglycemia, or severely elevated blood sugars. Keep blood sugar log and time of day checked  Please see past updates related to this goal by clicking on the "Past Updates" button in the selected goal       COMPLETED: Monitor and Manage My Blood Sugar-Diabetes Type 2       Timeframe:  Long-Range Goal Priority:  High Start Date:  04/04/20                           Expected End Date: 10/02/20                    Follow Up Date 07/08/20   - check blood sugar at prescribed times - check blood sugar if I feel it is too high or too low - take the blood sugar log to all doctor visits    Why is this important?   Checking your blood sugar at home helps to keep it from  getting very high or very low.  Writing the results in a diary or log helps the doctor know how to care for you.  Your blood sugar log should have the time, date and the results.  Also, write down the amount of insulin or other medicine that you take.  Other information, like what you ate, exercise done and how you were feeling, will also be helpful.     Notes: Try to work on cutting back on sodas and limiting carbohydrates.  Shoot for < 130 fasting glucose. 07/08/20 -Cut back on sodas, started on Farxiga.  FBG seems to be controlled based on home logs.      Depression Screen    04/29/2022    1:39 PM 04/23/2021    9:21 AM  10/16/2020    8:49 AM 04/15/2020    9:26 AM 04/17/2019    8:28 AM 04/14/2018    2:44 PM 05/18/2017   11:04 AM  PHQ 2/9 Scores  PHQ - 2 Score 0 0 0 0 0 0 0  PHQ- 9 Score     0      Fall Risk    04/29/2022    9:41 AM 04/23/2021    9:24 AM 10/16/2020    8:49 AM 04/15/2020    9:26 AM 04/17/2019    8:27 AM  Fall Risk   Falls in the past year? 0 1 1 0 0  Number falls in past yr: 0 1 0 0 0  Injury with Fall? 0 0 0 0 0  Risk for fall due to : No Fall Risks History of fall(s);Impaired balance/gait History of fall(s)    Follow up Falls prevention discussed;Education provided;Falls evaluation completed Falls prevention discussed Falls evaluation completed      FALL RISK PREVENTION PERTAINING TO THE HOME:  Any stairs in or around the home? Yes  If so, are there any without handrails? No  Home free of loose throw rugs in walkways, pet beds, electrical cords, etc? Yes  Adequate lighting in your home to reduce risk of falls? Yes   ASSISTIVE DEVICES UTILIZED TO PREVENT FALLS:  Life alert? No  Use of a cane, walker or w/c? No  Grab bars in the bathroom? Yes  Shower chair or bench in shower? No  Elevated toilet seat or a handicapped toilet? Yes   TIMED UP AND GO:  Was the test performed? No . Telephonic visit   Cognitive Function:        04/29/2022    1:41 PM 04/15/2020    9:24 AM  6CIT Screen  What Year? 0 points 0 points  What month? 0 points 0 points  What time? 0 points 0 points  Count back from 20 0 points 0 points  Months in reverse 0 points 0 points  Repeat phrase 0 points 0 points  Total Score 0 points 0 points    Immunizations Immunization History  Administered Date(s) Administered   Covid-19,MRNA Vaccine(Spikevax)73mos. thru 42yrs 11/27/2021   Influenza, High Dose Seasonal PF 11/24/2017   Influenza,inj,Quad PF,6+ Mos 10/22/2014, 10/29/2015   Influenza-Unspecified 01/27/2021, 11/27/2021   PFIZER(Purple Top)SARS-COV-2 Vaccination 02/23/2019, 03/16/2019, 11/03/2019, 05/13/2020    Pfizer Covid-19 Vaccine Bivalent Booster 2yrs & up 12/08/2020   Pneumococcal Conjugate-13 04/13/2016   Pneumococcal Polysaccharide-23 02/09/2008, 04/14/2018   Tdap 02/09/2008    TDAP status: Due, Education has been provided regarding the importance of this vaccine. Advised may receive this vaccine at local pharmacy or Health Dept. Aware to provide a copy of the vaccination record  if obtained from local pharmacy or Health Dept. Verbalized acceptance and understanding.  Flu Vaccine status: Up to date  Pneumococcal vaccine status: Up to date  Covid-19 vaccine status: Information provided on how to obtain vaccines.   Qualifies for Shingles Vaccine? Yes   Zostavax completed No   Shingrix Completed?: No.    Education has been provided regarding the importance of this vaccine. Patient has been advised to call insurance company to determine out of pocket expense if they have not yet received this vaccine. Advised may also receive vaccine at local pharmacy or Health Dept. Verbalized acceptance and understanding.  Screening Tests Health Maintenance  Topic Date Due   Zoster Vaccines- Shingrix (1 of 2) Never done   DTaP/Tdap/Td (2 - Td or Tdap) 02/08/2018   Diabetic kidney evaluation - Urine ACR  04/11/2021   COVID-19 Vaccine (6 - 2023-24 season) 01/22/2022   OPHTHALMOLOGY EXAM  04/30/2022   Lung Cancer Screening  06/18/2022   Diabetic kidney evaluation - eGFR measurement  10/06/2022   HEMOGLOBIN A1C  10/23/2022   FOOT EXAM  04/22/2023   Medicare Annual Wellness (AWV)  04/29/2023   Pneumonia Vaccine 28+ Years old  Completed   INFLUENZA VACCINE  Completed   Hepatitis C Screening  Completed   HPV VACCINES  Aged Out   COLONOSCOPY (Pts 45-6yrs Insurance coverage will need to be confirmed)  Discontinued    Health Maintenance  Health Maintenance Due  Topic Date Due   Zoster Vaccines- Shingrix (1 of 2) Never done   DTaP/Tdap/Td (2 - Td or Tdap) 02/08/2018   Diabetic kidney evaluation -  Urine ACR  04/11/2021   COVID-19 Vaccine (6 - 2023-24 season) 01/22/2022    Colorectal cancer screening: No longer required.   Lung Cancer Screening: (Low Dose CT Chest recommended if Age 75-80 years, 30 pack-year currently smoking OR have quit w/in 15years.) does qualify.   Lung Cancer Screening Referral: last 06/17/21  Additional Screening:  Hepatitis C Screening: does qualify; Completed 04/11/20  Vision Screening: Recommended annual ophthalmology exams for early detection of glaucoma and other disorders of the eye. Is the patient up to date with their annual eye exam?  Yes  Who is the provider or what is the name of the office in which the patient attends annual eye exams? Dr. Satira Sark  If pt is not established with a provider, would they like to be referred to a provider to establish care? No .   Dental Screening: Recommended annual dental exams for proper oral hygiene  Community Resource Referral / Chronic Care Management: CRR required this visit?  No   CCM required this visit?  No      Plan:     I have personally reviewed and noted the following in the patient's chart:   Medical and social history Use of alcohol, tobacco or illicit drugs  Current medications and supplements including opioid prescriptions. Patient is not currently taking opioid prescriptions. Functional ability and status Nutritional status Physical activity Advanced directives List of other physicians Hospitalizations, surgeries, and ER visits in previous 12 months Vitals Screenings to include cognitive, depression, and falls Referrals and appointments  In addition, I have reviewed and discussed with patient certain preventive protocols, quality metrics, and best practice recommendations. A written personalized care plan for preventive services as well as general preventive health recommendations were provided to patient.     Vanetta Mulders, [place]   [id number]   Due to this being a virtual  visit, the after visit summary  with patients personalized plan was offered to patient via mail or my-chart.  Patient would like to access on my-chart  Nurse Notes: No concerns

## 2022-04-29 NOTE — Progress Notes (Signed)
Care Management & Coordination Services Pharmacy Team   Reason for Encounter: Diabetes   Contacted patient to discuss diabetes disease state. Unsuccessful outreach. Left voicemail for patient to return call.  Current antihyperglycemic regimen:  Dapagliflozin propanediol (FARXIGA) 10 MG TABS tablet     Patient verbally confirms he is taking the above medications as directed.   What diet changes have been made to improve diabetes control? Patient reported  What recent interventions/DTPs have been made to improve glycemic control:  stopped glipizide ER 2.5 mg daily in 09/2021 and stopped Januvia due to cost and Tradjenta was not covered by patient's insurance.   Have there been any recent hospitalizations or ED visits since last visit with PharmD? No  Patient  hypoglycemic symptoms, including   Patient  hyperglycemic symptoms, including   How often are you checking your blood sugar?   What are your blood sugars ranging?  Fasting:  Before meals:  After meals:  Bedtime:   During the week, how often does your blood glucose drop below 70?   Are you checking your feet daily/regularly? Yes  Adherence Review: Is the patient currently on a STATIN medication? Yes Is the patient currently on ACE/ARB medication? Yes Does the patient have >5 day gap between last estimated fill dates? No   Chart Updates:  Recent office visits:  None noted.   Recent consult visits:  04/22/22 James Kingdom MD - Endocrinology - Diabetes - Labs were ordered. Follow up in 4 months.   12/09/21 James Majestic, MD - Cardiology - Hypertension - EKG performed. No medication changes. Follow up in 1 year.   11/20/21 James Montana, NP - Cardiology - Aortic stenosis - Follow up in 6 months.   Hospital visits:  None in previous 6 months  Medications: Outpatient Encounter Medications as of 04/29/2022  Medication Sig   albuterol (VENTOLIN HFA) 108 (90 Base) MCG/ACT inhaler Inhale 2 puffs into the lungs  every 6 (six) hours as needed for wheezing or shortness of breath.   allopurinol (ZYLOPRIM) 100 MG tablet TAKE 1 TABLET BY MOUTH EVERY DAY   amLODipine (NORVASC) 10 MG tablet Take 1 tablet (10 mg total) by mouth daily.   aspirin EC 81 MG tablet Take 1 tablet (81 mg total) by mouth daily. Swallow whole.   Blood Glucose Monitoring Suppl (FREESTYLE LITE) DEVI Use to check blood sugar once daily   carvedilol (COREG) 25 MG tablet TAKE 1 TABLET BY MOUTH TWICE A DAY   Cholecalciferol (VITAMIN D-3) 1000 UNITS CAPS Take by mouth daily. 2 capsules.   dapagliflozin propanediol (FARXIGA) 10 MG TABS tablet TAKE 1 TABLET BY MOUTH DAILY BEFORE BREAKFAST.   doxazosin (CARDURA) 8 MG tablet Take 1 tablet (8 mg total) by mouth 2 (two) times daily.   ferrous sulfate 325 (65 FE) MG tablet Take 1 tablet (325 mg total) by mouth 2 (two) times daily with a meal.   fish oil-omega-3 fatty acids 1000 MG capsule Take 1,200 mg by mouth daily.    glucose blood (FREESTYLE LITE) test strip USE TO CHECK BLOOD SUGAR 1-2X DAILY. E11.59   Multiple Vitamin (MULTIVITAMIN WITH MINERALS) TABS Take 1 tablet by mouth daily.   omeprazole (PRILOSEC) 20 MG capsule Take 20 mg by mouth daily.   PRESCRIPTION MEDICATION Uses C-PAP at bedtime   rosuvastatin (CRESTOR) 40 MG tablet Take 1 tablet (40 mg total) by mouth daily.   sodium bicarbonate 650 MG tablet Take 650 mg by mouth daily.   umeclidinium-vilanterol (ANORO ELLIPTA) 62.5-25 MCG/ACT AEPB Inhale  1 puff into the lungs daily.   valsartan (DIOVAN) 320 MG tablet TAKE 1 TABLET BY MOUTH EVERY DAY   No facility-administered encounter medications on file as of 04/29/2022.    Recent Relevant Labs: Lab Results  Component Value Date/Time   HGBA1C 6.9 (A) 04/22/2022 11:26 AM   HGBA1C 6.5 (A) 09/08/2021 09:20 AM   HGBA1C 6.7 (H) 04/30/2021 08:23 AM   HGBA1C 7.0 (H) 04/11/2020 09:28 AM   MICROALBUR 99.9 04/11/2020 09:28 AM   MICROALBUR 176.3 12/08/2018 11:23 AM    Kidney Function Lab  Results  Component Value Date/Time   CREATININE 1.78 (H) 10/05/2021 11:48 PM   CREATININE 1.80 (H) 10/05/2021 11:55 AM   CREATININE 1.82 (H) 04/30/2021 08:23 AM   CREATININE 1.97 (H) 10/16/2020 09:16 AM   GFRNONAA 39 (L) 10/05/2021 11:48 PM   GFRNONAA 37 (L) 04/11/2020 09:28 AM   GFRAA 43 (L) 04/11/2020 09:28 AM    Star Rating Drugs:  Medication:   Last Fill: Day Supply  Valsartan 320 mg    04/07/22 90 Dapagliflozin 10 mg  04/24/22 90 Rosuvastatin 20 mg   02/20/22 90  Care Gaps: Annual wellness visit in last year? Yes done 04/23/21 Last eye exam / retinopathy screening: due 04/30/22 Last diabetic foot exam: due 04/22/23  Future Appointments  Date Time Provider [place]  08/24/2022  9:00 AM James Kingdom, MD [id number] None    James Jones, [place]

## 2022-04-29 NOTE — Patient Instructions (Signed)
[name] , Thank you for taking time to come for your Medicare Wellness Visit. I appreciate your ongoing commitment to your health goals. Please review the following plan we discussed and let me know if I can assist you in the future.   These are the goals we discussed:  Goals      Exercise 3x per week (30 min per time)     Continue to exercise and stay healthy.        This is a list of the screening recommended for you and due dates:  Health Maintenance  Topic Date Due   Zoster (Shingles) Vaccine (1 of 2) Never done   DTaP/Tdap/Td vaccine (2 - Td or Tdap) 02/08/2018   Yearly kidney health urinalysis for diabetes  04/11/2021   COVID-19 Vaccine (6 - 2023-24 season) 01/22/2022   Eye exam for diabetics  04/30/2022   Screening for Lung Cancer  06/18/2022   Yearly kidney function blood test for diabetes  10/06/2022   Hemoglobin A1C  10/23/2022   Complete foot exam   04/22/2023   Medicare Annual Wellness Visit  04/29/2023   Pneumonia Vaccine  Completed   Flu Shot  Completed   Hepatitis C Screening: USPSTF Recommendation to screen - Ages 18-79 yo.  Completed   HPV Vaccine  Aged Out   Colon Cancer Screening  Discontinued    Advanced directives: Forms are available if you choose in the future to pursue completion.  This is recommended in order to make sure that your health wishes are honored in the event that you are unable to verbalize them to the provider.    Conditions/risks identified: Aim for 30 minutes of exercise or brisk walking, 6-8 glasses of water, and 5 servings of fruits and vegetables each day.   Next appointment: Follow up in one year for your annual wellness visit.   Preventive Care 30 Years and Older, Male  Preventive care refers to lifestyle choices and visits with your health care provider that can promote health and wellness. What does preventive care include? A yearly physical exam. This is also called an annual well check. Dental exams once or twice a  year. Routine eye exams. Ask your health care provider how often you should have your eyes checked. Personal lifestyle choices, including: Daily care of your teeth and gums. Regular physical activity. Eating a healthy diet. Avoiding tobacco and drug use. Limiting alcohol use. Practicing safe sex. Taking low doses of aspirin every day. Taking vitamin and mineral supplements as recommended by your health care provider. What happens during an annual well check? The services and screenings done by your health care provider during your annual well check will depend on your age, overall health, lifestyle risk factors, and family history of disease. Counseling  Your health care provider may ask you questions about your: Alcohol use. Tobacco use. Drug use. Emotional well-being. Home and relationship well-being. Sexual activity. Eating habits. History of falls. Memory and ability to understand (cognition). Work and work Statistician. Screening  You may have the following tests or measurements: Height, weight, and BMI. Blood pressure. Lipid and cholesterol levels. These may be checked every 5 years, or more frequently if you are over 34 years old. Skin check. Lung cancer screening. You may have this screening every year starting at age 52 if you have a 30-pack-year history of smoking and currently smoke or have quit within the past 15 years. Fecal occult blood test (FOBT) of the stool. You may have this test every  year starting at age 71. Flexible sigmoidoscopy or colonoscopy. You may have a sigmoidoscopy every 5 years or a colonoscopy every 10 years starting at age 59. Prostate cancer screening. Recommendations will vary depending on your family history and other risks. Hepatitis C blood test. Hepatitis B blood test. Sexually transmitted disease (STD) testing. Diabetes screening. This is done by checking your blood sugar (glucose) after you have not eaten for a while (fasting). You may  have this done every 1-3 years. Abdominal aortic aneurysm (AAA) screening. You may need this if you are a current or former smoker. Osteoporosis. You may be screened starting at age 57 if you are at high risk. Talk with your health care provider about your test results, treatment options, and if necessary, the need for more tests. Vaccines  Your health care provider may recommend certain vaccines, such as: Influenza vaccine. This is recommended every year. Tetanus, diphtheria, and acellular pertussis (Tdap, Td) vaccine. You may need a Td booster every 10 years. Zoster vaccine. You may need this after age 47. Pneumococcal 13-valent conjugate (PCV13) vaccine. One dose is recommended after age 34. Pneumococcal polysaccharide (PPSV23) vaccine. One dose is recommended after age 3. Talk to your health care provider about which screenings and vaccines you need and how often you need them. This information is not intended to replace advice given to you by your health care provider. Make sure you discuss any questions you have with your health care provider. Document Released: 02/21/2015 Document Revised: 10/15/2015 Document Reviewed: 11/26/2014 Elsevier Interactive Patient Education  2017 [place] Prevention in the Home Falls can cause injuries. They can happen to people of all ages. There are many things you can do to make your home safe and to help prevent falls. What can I do on the outside of my home? Regularly fix the edges of walkways and driveways and fix any cracks. Remove anything that might make you trip as you walk through a door, such as a raised step or threshold. Trim any bushes or trees on the path to your home. Use bright outdoor lighting. Clear any walking paths of anything that might make someone trip, such as rocks or tools. Regularly check to see if handrails are loose or broken. Make sure that both sides of any steps have handrails. Any raised decks and porches  should have guardrails on the edges. Have any leaves, snow, or ice cleared regularly. Use sand or salt on walking paths during winter. Clean up any spills in your garage right away. This includes oil or grease spills. What can I do in the bathroom? Use night lights. Install grab bars by the toilet and in the tub and shower. Do not use towel bars as grab bars. Use non-skid mats or decals in the tub or shower. If you need to sit down in the shower, use a plastic, non-slip stool. Keep the floor dry. Clean up any water that spills on the floor as soon as it happens. Remove soap buildup in the tub or shower regularly. Attach bath mats securely with double-sided non-slip rug tape. Do not have throw rugs and other things on the floor that can make you trip. What can I do in the bedroom? Use night lights. Make sure that you have a light by your bed that is easy to reach. Do not use any sheets or blankets that are too big for your bed. They should not hang down onto the floor. Have a firm chair that has side  arms. You can use this for support while you get dressed. Do not have throw rugs and other things on the floor that can make you trip. What can I do in the kitchen? Clean up any spills right away. Avoid walking on wet floors. Keep items that you use a lot in easy-to-reach places. If you need to reach something above you, use a strong step stool that has a grab bar. Keep electrical cords out of the way. Do not use floor polish or wax that makes floors slippery. If you must use wax, use non-skid floor wax. Do not have throw rugs and other things on the floor that can make you trip. What can I do with my stairs? Do not leave any items on the stairs. Make sure that there are handrails on both sides of the stairs and use them. Fix handrails that are broken or loose. Make sure that handrails are as long as the stairways. Check any carpeting to make sure that it is firmly attached to the stairs.  Fix any carpet that is loose or worn. Avoid having throw rugs at the top or bottom of the stairs. If you do have throw rugs, attach them to the floor with carpet tape. Make sure that you have a light switch at the top of the stairs and the bottom of the stairs. If you do not have them, ask someone to add them for you. What else can I do to help prevent falls? Wear shoes that: Do not have high heels. Have rubber bottoms. Are comfortable and fit you well. Are closed at the toe. Do not wear sandals. If you use a stepladder: Make sure that it is fully opened. Do not climb a closed stepladder. Make sure that both sides of the stepladder are locked into place. Ask someone to hold it for you, if possible. Clearly mark and make sure that you can see: Any grab bars or handrails. First and last steps. Where the edge of each step is. Use tools that help you move around (mobility aids) if they are needed. These include: Canes. Walkers. Scooters. Crutches. Turn on the lights when you go into a dark area. Replace any light bulbs as soon as they burn out. Set up your furniture so you have a clear path. Avoid moving your furniture around. If any of your floors are uneven, fix them. If there are any pets around you, be aware of where they are. Review your medicines with your doctor. Some medicines can make you feel dizzy. This can increase your chance of falling. Ask your doctor what other things that you can do to help prevent falls. This information is not intended to replace advice given to you by your health care provider. Make sure you discuss any questions you have with your health care provider. Document Released: 11/21/2008 Document Revised: 07/03/2015 Document Reviewed: 03/01/2014 Elsevier Interactive Patient Education  2017 [employer].

## 2022-05-01 ENCOUNTER — Other Ambulatory Visit: Payer: Self-pay | Admitting: Family Medicine

## 2022-05-03 NOTE — Telephone Encounter (Signed)
Attempted to call patient to schedule appointment- left message on VM to call office- courtesy 30 day Rx given. Requested Prescriptions  Pending Prescriptions Disp Refills   carvedilol (COREG) 25 MG tablet [Pharmacy Med Name: CARVEDILOL 25 MG TABLET] 180 tablet 1    Sig: TAKE 1 TABLET BY MOUTH TWICE A DAY     Cardiovascular: Beta Blockers 3 Failed - 05/01/2022  2:32 PM      Failed - Cr in normal range and within 360 days    Creat  Date Value Ref Range Status  04/30/2021 1.82 (H) 0.70 - 1.28 mg/dL Final   Creatinine, Ser  Date Value Ref Range Status  10/05/2021 1.78 (H) 0.61 - 1.24 mg/dL Final   Creatinine, Urine  Date Value Ref Range Status  04/11/2020 53 20 - 320 mg/dL Final         Failed - Valid encounter within last 6 months    Recent Outpatient Visits           12 months ago Benign essential HTN   [place] Pickard, Cammie Mcgee, MD   1 year ago Benign essential HTN   [place] Pickard, Cammie Mcgee, MD   2 years ago Urinary frequency   [place] Susy Frizzle, MD   2 years ago General medical exam   [place] Susy Frizzle, MD   2 years ago Left sided sciatica   [place] Pickard, Cammie Mcgee, MD              Passed - AST in normal range and within 360 days    AST  Date Value Ref Range Status  10/05/2021 28 15 - 41 U/L Final         Passed - ALT in normal range and within 360 days    ALT  Date Value Ref Range Status  10/05/2021 27 0 - 44 U/L Final         Passed - Last BP in normal range    BP Readings from Last 1 Encounters:  04/22/22 138/82         Passed - Last Heart Rate in normal range    Pulse Readings from Last 1 Encounters:  04/22/22 60

## 2022-05-04 ENCOUNTER — Ambulatory Visit: Payer: Medicare Other | Admitting: Family Medicine

## 2022-05-10 ENCOUNTER — Telehealth: Payer: Self-pay | Admitting: Cardiovascular Disease

## 2022-05-10 DIAGNOSIS — R079 Chest pain, unspecified: Secondary | ICD-10-CM

## 2022-05-10 MED ORDER — FAMOTIDINE 20 MG PO TABS: 20.0000 mg | ORAL_TABLET | Freq: Every day | ORAL | 11 refills | Status: DC

## 2022-05-10 MED ORDER — PANTOPRAZOLE SODIUM 40 MG PO TBEC: 40.0000 mg | DELAYED_RELEASE_TABLET | Freq: Every day | ORAL | 11 refills | Status: DC

## 2022-05-10 NOTE — Addendum Note (Signed)
Addended by: Gerald Stabs on: 05/10/2022 01:16 PM   Modules accepted: Orders

## 2022-05-10 NOTE — Telephone Encounter (Signed)
Left message for patient to call back  

## 2022-05-10 NOTE — Telephone Encounter (Signed)
Patient returning call.

## 2022-05-10 NOTE — Telephone Encounter (Signed)
Returned call to patient, no answer- straight to VM, left message.

## 2022-05-10 NOTE — Telephone Encounter (Signed)
Dicsussed with RN. Patient exercised this morning without anginal symptoms and present chest pain described as "stabbing" after a meal. Myoview 10/2021 low risk study with no ischemia.  Stop Omeprazole. Start Protonix 40mg  QD and Pepcid 20mg  QHS.   If recurrent chest pain or if unresolved, will require ED evaluation.   Loel Dubonnet, NP

## 2022-05-10 NOTE — Telephone Encounter (Signed)
Patient returned call, transferred from call center-  Patient states he has been having some chest pains, they come and go. He says it feels like getting hit in the chest with a bat. It last only for a few minutes. He does state that he is having the pain right now and it describes it as a sharp, cramp like pain. He states he has had this pain before off and on for a few years. He states nothing makes it better or worse, he has tried taking Pepto bismol and that isn't helping. He has been doing all his normal things without interference. Patient placed on hold to discuss with Laurann Montana, NP provider in office.    "Dicsussed with RN. Patient exercised this morning without anginal symptoms and present chest pain described as "stabbing" after a meal. Myoview 10/2021 low risk study with no ischemia.   Stop Omeprazole. Start Protonix 40mg  QD and Pepcid 20mg  QHS.    If recurrent chest pain or if unresolved, will require ED evaluation.    Loel Dubonnet, NP     Rx sent to pharm preferred by patient. Informed patient that if things do not improve he will need to let us know and be seen in the ED.

## 2022-05-10 NOTE — Telephone Encounter (Signed)
Pt c/o of Chest Pain: STAT if CP now or developed within 24 hours  1. Are you having CP right now? No   2. Are you experiencing any other symptoms (ex. SOB, nausea, vomiting, sweating)? No   3. How long have you been experiencing CP? 3 or 4 days ago   4. Is your CP continuous or coming and going? Coming and going  5. Have you taken Nitroglycerin? No  ?

## 2022-05-11 ENCOUNTER — Encounter: Payer: Self-pay | Admitting: Internal Medicine

## 2022-05-11 DIAGNOSIS — H11823 Conjunctivochalasis, bilateral: Secondary | ICD-10-CM | POA: Diagnosis not present

## 2022-05-11 DIAGNOSIS — E119 Type 2 diabetes mellitus without complications: Secondary | ICD-10-CM | POA: Diagnosis not present

## 2022-05-11 DIAGNOSIS — H524 Presbyopia: Secondary | ICD-10-CM | POA: Diagnosis not present

## 2022-05-11 DIAGNOSIS — H35033 Hypertensive retinopathy, bilateral: Secondary | ICD-10-CM | POA: Diagnosis not present

## 2022-05-11 DIAGNOSIS — H04223 Epiphora due to insufficient drainage, bilateral lacrimal glands: Secondary | ICD-10-CM | POA: Diagnosis not present

## 2022-05-11 DIAGNOSIS — H52203 Unspecified astigmatism, bilateral: Secondary | ICD-10-CM | POA: Diagnosis not present

## 2022-05-11 LAB — HM DIABETES EYE EXAM

## 2022-05-18 DIAGNOSIS — N1831 Chronic kidney disease, stage 3a: Secondary | ICD-10-CM | POA: Diagnosis not present

## 2022-05-24 DIAGNOSIS — E1122 Type 2 diabetes mellitus with diabetic chronic kidney disease: Secondary | ICD-10-CM | POA: Diagnosis not present

## 2022-05-24 DIAGNOSIS — E1129 Type 2 diabetes mellitus with other diabetic kidney complication: Secondary | ICD-10-CM | POA: Diagnosis not present

## 2022-05-24 DIAGNOSIS — K219 Gastro-esophageal reflux disease without esophagitis: Secondary | ICD-10-CM | POA: Diagnosis not present

## 2022-05-24 DIAGNOSIS — I272 Pulmonary hypertension, unspecified: Secondary | ICD-10-CM | POA: Diagnosis not present

## 2022-05-24 DIAGNOSIS — I129 Hypertensive chronic kidney disease with stage 1 through stage 4 chronic kidney disease, or unspecified chronic kidney disease: Secondary | ICD-10-CM | POA: Diagnosis not present

## 2022-05-24 DIAGNOSIS — E872 Acidosis, unspecified: Secondary | ICD-10-CM | POA: Diagnosis not present

## 2022-05-24 DIAGNOSIS — N1832 Chronic kidney disease, stage 3b: Secondary | ICD-10-CM | POA: Diagnosis not present

## 2022-05-24 DIAGNOSIS — R809 Proteinuria, unspecified: Secondary | ICD-10-CM | POA: Diagnosis not present

## 2022-06-17 ENCOUNTER — Other Ambulatory Visit: Payer: Self-pay | Admitting: Family Medicine

## 2022-06-17 NOTE — Telephone Encounter (Signed)
Med reordered by Dr. Carlus Pavlov on 04/07/22 #90 1 RF- prescriber not at this practice  Requested Prescriptions  Refused Prescriptions Disp Refills   dapagliflozin propanediol (FARXIGA) 10 MG TABS tablet 90 tablet 1    Sig: Take 1 tablet (10 mg total) by mouth daily before breakfast.     Endocrinology:  Diabetes - SGLT2 Inhibitors Failed - 06/17/2022 10:48 AM      Failed - Cr in normal range and within 360 days    Creat  Date Value Ref Range Status  04/30/2021 1.82 (H) 0.70 - 1.28 mg/dL Final   Creatinine, Ser  Date Value Ref Range Status  10/05/2021 1.78 (H) 0.61 - 1.24 mg/dL Final   Creatinine, Urine  Date Value Ref Range Status  04/11/2020 53 20 - 320 mg/dL Final         Failed - eGFR in normal range and within 360 days    GFR, Est African American  Date Value Ref Range Status  04/11/2020 43 (L) > OR = 60 mL/min/1.57m2 Final   GFR, Est Non African American  Date Value Ref Range Status  04/11/2020 37 (L) > OR = 60 mL/min/1.67m2 Final   GFR, Estimated  Date Value Ref Range Status  10/05/2021 39 (L) >60 mL/min Final    Comment:    (NOTE) Calculated using the CKD-EPI Creatinine Equation (2021)    eGFR  Date Value Ref Range Status  12/25/2020 35 (L) >59 mL/min/1.73 Final         Failed - Valid encounter within last 6 months    Recent Outpatient Visits           1 year ago Benign essential HTN   [facility] Donita Brooks, MD   1 year ago Benign essential HTN   [facility] Donita Brooks, MD   2 years ago Urinary frequency   [facility] Donita Brooks, MD   2 years ago General medical exam   [facility] Donita Brooks, MD   2 years ago Left sided sciatica   [facility] Pickard, Priscille Heidelberg, MD              Passed - HBA1C is between 0 and 7.9 and within 180 days    Hemoglobin A1C  Date Value Ref Range Status  04/22/2022 6.9 (A) 4.0 - 5.6 % Final   Hgb A1c  MFr Bld  Date Value Ref Range Status  04/30/2021 6.7 (H) <5.7 % of total Hgb Final    Comment:    For someone without known diabetes, a hemoglobin A1c value of 6.5% or greater indicates that they may have  diabetes and this should be confirmed with a follow-up  test. . For someone with known diabetes, a value <7% indicates  that their diabetes is well controlled and a value  greater than or equal to 7% indicates suboptimal  control. A1c targets should be individualized based on  duration of diabetes, age, comorbid conditions, and  other considerations. . Currently, no consensus exists regarding use of hemoglobin A1c for diagnosis of diabetes for children. Marland Kitchen

## 2022-06-17 NOTE — Telephone Encounter (Signed)
Prescription Request  06/17/2022  LOV: Visit date not found  What is the name of the medication or equipment? dapagliflozin propanediol (FARXIGA) 10 MG TABS tablet  **last pill taken yesterday**  Have you contacted your pharmacy to request a refill? Yes   Which pharmacy would you like this sent to?  [id number] [place], [place] - [address] [place] [address] Phone: [phone] Fax: [phone]    Patient notified that their request is being sent to the clinical staff for review and that they should receive a response within 2 business days.   Please advise patient at [phone].

## 2022-06-28 ENCOUNTER — Other Ambulatory Visit: Payer: Self-pay | Admitting: Family Medicine

## 2022-06-29 NOTE — Telephone Encounter (Signed)
Requested medication (s) are due for refill today: yes  Requested medication (s) are on the active medication list: yes    Last refill: 05/07/21  #90  3 refills  Future visit scheduled no  Notes to clinic:Failed due to labs, please review. Thank you.  Requested Prescriptions  Pending Prescriptions Disp Refills   allopurinol (ZYLOPRIM) 100 MG tablet [Pharmacy Med Name: ALLOPURINOL 100 MG TABLET] 90 tablet 3    Sig: TAKE 1 TABLET BY MOUTH EVERY DAY     Endocrinology:  Gout Agents - allopurinol Failed - 06/28/2022  9:24 AM      Failed - Uric Acid in normal range and within 360 days    Uric Acid, Serum  Date Value Ref Range Status  02/19/2019 5.7 4.0 - 8.0 mg/dL Final    Comment:    Therapeutic target for gout patients: <6.0 mg/dL .          Failed - Cr in normal range and within 360 days    Creat  Date Value Ref Range Status  04/30/2021 1.82 (H) 0.70 - 1.28 mg/dL Final   Creatinine, Ser  Date Value Ref Range Status  10/05/2021 1.78 (H) 0.61 - 1.24 mg/dL Final   Creatinine, Urine  Date Value Ref Range Status  04/11/2020 53 20 - 320 mg/dL Final         Failed - Valid encounter within last 12 months    Recent Outpatient Visits           1 year ago Benign essential HTN   [facility] Donita Brooks, MD   1 year ago Benign essential HTN   [facility] Donita Brooks, MD   2 years ago Urinary frequency   [facility] Tanya Nones, Priscille Heidelberg, MD   2 years ago General medical exam   [facility] Donita Brooks, MD   2 years ago Left sided sciatica   [facility] Pickard, Priscille Heidelberg, MD              Passed - CBC within normal limits and completed in the last 12 months    WBC  Date Value Ref Range Status  10/05/2021 5.5 4.0 - 10.5 K/uL Final   RBC  Date Value Ref Range Status  10/05/2021 4.31 4.22 - 5.81 MIL/uL Final   Hemoglobin  Date Value Ref Range Status  10/05/2021 11.9 (L)  13.0 - 17.0 g/dL Final   HCT  Date Value Ref Range Status  10/05/2021 35.5 (L) 39.0 - 52.0 % Final   MCHC  Date Value Ref Range Status  10/05/2021 33.5 30.0 - 36.0 g/dL Final   [facility]  Date Value Ref Range Status  10/05/2021 27.6 26.0 - 34.0 pg Final   MCV  Date Value Ref Range Status  10/05/2021 82.4 80.0 - 100.0 fL Final   No results found for: "PLTCOUNTKUC", "LABPLAT", "POCPLA" RDW  Date Value Ref Range Status  10/05/2021 13.9 11.5 - 15.5 % Final

## 2022-07-19 ENCOUNTER — Other Ambulatory Visit: Payer: Self-pay | Admitting: Family

## 2022-07-26 ENCOUNTER — Telehealth: Payer: Self-pay | Admitting: Family Medicine

## 2022-07-26 ENCOUNTER — Other Ambulatory Visit: Payer: Self-pay

## 2022-07-26 DIAGNOSIS — I10 Essential (primary) hypertension: Secondary | ICD-10-CM

## 2022-07-26 DIAGNOSIS — I2510 Atherosclerotic heart disease of native coronary artery without angina pectoris: Secondary | ICD-10-CM

## 2022-07-26 MED ORDER — CARVEDILOL 25 MG PO TABS: 25.0000 mg | ORAL_TABLET | Freq: Two times a day (BID) | ORAL | 1 refills | Status: DC

## 2022-07-26 NOTE — Telephone Encounter (Signed)
Prescription Request  07/26/2022  LOV: Visit date not found  What is the name of the medication or equipment? carvedilol (COREG) 25 MG tablet [   Have you contacted your pharmacy to request a refill? Yes   Which pharmacy would you like this sent to?  [id number] [place], [place] - [address] [place] [address] Phone: [phone] Fax: [phone]    Patient notified that their request is being sent to the clinical staff for review and that they should receive a response within 2 business days.   Please advise at [facility] [phone]

## 2022-07-30 ENCOUNTER — Other Ambulatory Visit: Payer: Medicare Other

## 2022-07-30 DIAGNOSIS — E785 Hyperlipidemia, unspecified: Secondary | ICD-10-CM

## 2022-07-30 DIAGNOSIS — I5032 Chronic diastolic (congestive) heart failure: Secondary | ICD-10-CM

## 2022-07-30 DIAGNOSIS — I2510 Atherosclerotic heart disease of native coronary artery without angina pectoris: Secondary | ICD-10-CM

## 2022-07-30 DIAGNOSIS — I4510 Unspecified right bundle-branch block: Secondary | ICD-10-CM | POA: Diagnosis not present

## 2022-07-30 DIAGNOSIS — I10 Essential (primary) hypertension: Secondary | ICD-10-CM | POA: Diagnosis not present

## 2022-07-30 DIAGNOSIS — N1832 Chronic kidney disease, stage 3b: Secondary | ICD-10-CM | POA: Diagnosis not present

## 2022-07-30 DIAGNOSIS — E1159 Type 2 diabetes mellitus with other circulatory complications: Secondary | ICD-10-CM | POA: Diagnosis not present

## 2022-07-31 LAB — LIPID PANEL
Cholesterol: 143 mg/dL (ref <200)
HDL: 59 mg/dL (ref >=40)
LDL Cholesterol (Calc): 66 mg/dL (calc)
Non-HDL Cholesterol (Calc): 84 mg/dL (calc) (ref <130)
Total CHOL/HDL Ratio: 2.4 (calc) (ref <5.0)
Triglycerides: 92 mg/dL (ref <150)

## 2022-07-31 LAB — MICROALBUMIN / CREATININE URINE RATIO
Creatinine, Urine: 52 mg/dL (ref 20–320)
Microalb Creat Ratio: 1225 mg/g creat — ABNORMAL HIGH (ref <30)
Microalb, Ur: 63.7 mg/dL

## 2022-07-31 LAB — COMPLETE METABOLIC PANEL WITH GFR
AG Ratio: 1.9 (calc) (ref 1.0–2.5)
ALT: 21 U/L (ref 9–46)
AST: 29 U/L (ref 10–35)
Albumin: 4.3 g/dL (ref 3.6–5.1)
Alkaline phosphatase (APISO): 76 U/L (ref 35–144)
BUN/Creatinine Ratio: 14 (calc) (ref 6–22)
BUN: 29 mg/dL — ABNORMAL HIGH (ref 7–25)
CO2: 20 mmol/L (ref 20–32)
Calcium: 9.5 mg/dL (ref 8.6–10.3)
Chloride: 109 mmol/L (ref 98–110)
Creat: 2.04 mg/dL — ABNORMAL HIGH (ref 0.70–1.28)
Globulin: 2.3 g/dL (calc) (ref 1.9–3.7)
Glucose, Bld: 103 mg/dL — ABNORMAL HIGH (ref 65–99)
Potassium: 4.3 mmol/L (ref 3.5–5.3)
Sodium: 140 mmol/L (ref 135–146)
Total Bilirubin: 0.4 mg/dL (ref 0.2–1.2)
Total Protein: 6.6 g/dL (ref 6.1–8.1)
eGFR: 33 mL/min/{1.73_m2} — ABNORMAL LOW (ref >=60)

## 2022-07-31 LAB — CBC WITH DIFFERENTIAL/PLATELET
Absolute Monocytes: 435 cells/uL (ref 200–950)
Basophils Absolute: 39 cells/uL (ref 0–200)
Basophils Relative: 0.7 %
Eosinophils Absolute: 132 cells/uL (ref 15–500)
Eosinophils Relative: 2.4 %
HCT: 39.2 % (ref 38.5–50.0)
Hemoglobin: 12.7 g/dL — ABNORMAL LOW (ref 13.2–17.1)
Lymphs Abs: 1062 cells/uL (ref 850–3900)
MCH: 27.7 pg (ref 27.0–33.0)
MCHC: 32.4 g/dL (ref 32.0–36.0)
MCV: 85.4 fL (ref 80.0–100.0)
MPV: 10.1 fL (ref 7.5–12.5)
Monocytes Relative: 7.9 %
Neutro Abs: 3834 cells/uL (ref 1500–7800)
Neutrophils Relative %: 69.7 %
Platelets: 192 10*3/uL (ref 140–400)
RBC: 4.59 10*6/uL (ref 4.20–5.80)
RDW: 14.3 % (ref 11.0–15.0)
Total Lymphocyte: 19.3 %
WBC: 5.5 10*3/uL (ref 3.8–10.8)

## 2022-07-31 LAB — VITAMIN B12: Vitamin B-12: 826 pg/mL (ref 200–1100)

## 2022-07-31 LAB — HEMOGLOBIN A1C
Hgb A1c MFr Bld: 6.6 % of total Hgb — ABNORMAL HIGH (ref <5.7)
Mean Plasma Glucose: 143 mg/dL
eAG (mmol/L): 7.9 mmol/L

## 2022-08-06 ENCOUNTER — Other Ambulatory Visit: Payer: Self-pay | Admitting: Family Medicine

## 2022-08-09 NOTE — Telephone Encounter (Signed)
Requested medication (s) are due for refill today: yes  Requested medication (s) are on the active medication list: yes  Last refill:  06/30/22 #60  Future visit scheduled: 08/10/22  Notes to clinic:  pt has appt tomorrow Does pt need Prilosec- was dc'd at discharge but pharmacy sent request   Requested Prescriptions  Pending Prescriptions Disp Refills   omeprazole (PRILOSEC) 20 MG capsule [Pharmacy Med Name: OMEPRAZOLE DR 20 MG CAPSULE] 90 capsule 3    Sig: TAKE 1 CAPSULE BY MOUTH EVERY DAY     Gastroenterology: Proton Pump Inhibitors Failed - 08/06/2022  9:55 AM      Failed - Valid encounter within last 12 months    Recent Outpatient Visits           1 year ago Benign essential HTN   [facility] Pickard, Priscille Heidelberg, MD   1 year ago Benign essential HTN   [facility] Pickard, Priscille Heidelberg, MD   2 years ago Urinary frequency   [facility] Pickard, Priscille Heidelberg, MD   2 years ago General medical exam   [facility] Donita Brooks, MD   2 years ago Left sided sciatica   [facility] Pickard, Priscille Heidelberg, MD       Future Appointments             Tomorrow Donita Brooks, MD [place] [facility]             allopurinol (ZYLOPRIM) 100 MG tablet [Pharmacy Med Name: ALLOPURINOL 100 MG TABLET] 60 tablet 0    Sig: TAKE 1 TABLET BY MOUTH EVERY DAY     Endocrinology:  Gout Agents - allopurinol Failed - 08/06/2022  9:55 AM      Failed - Uric Acid in normal range and within 360 days    Uric Acid, Serum  Date Value Ref Range Status  02/19/2019 5.7 4.0 - 8.0 mg/dL Final    Comment:    Therapeutic target for gout patients: <6.0 mg/dL .          Failed - Cr in normal range and within 360 days    Creat  Date Value Ref Range Status  07/30/2022 2.04 (H) 0.70 - 1.28 mg/dL Final   Creatinine, Urine  Date Value Ref Range Status  07/30/2022 52 20 - 320 mg/dL Final          Failed - Valid encounter within last 12 months    Recent Outpatient Visits           1 year ago Benign essential HTN   [facility] Pickard, Priscille Heidelberg, MD   1 year ago Benign essential HTN   [facility] Tanya Nones, Priscille Heidelberg, MD   2 years ago Urinary frequency   [facility] Tanya Nones, Priscille Heidelberg, MD   2 years ago General medical exam   [facility] Donita Brooks, MD   2 years ago Left sided sciatica   [facility] Pickard, Priscille Heidelberg, MD       Future Appointments             Tomorrow Pickard, Priscille Heidelberg, MD [place] [facility]            Failed - CBC within normal limits and completed in the last 12 months    WBC  Date Value Ref Range Status  07/30/2022 5.5 3.8 - 10.8 Thousand/uL Final   RBC  Date Value Ref Range Status  07/30/2022 4.59 4.20 - 5.80 Million/uL Final   Hemoglobin  Date Value Ref Range Status  07/30/2022 12.7 (L) 13.2 - 17.1 g/dL Final   HCT  Date Value Ref Range Status  07/30/2022 39.2 38.5 - 50.0 % Final   MCHC  Date Value Ref Range Status  07/30/2022 32.4 32.0 - 36.0 g/dL Final   [facility]  Date Value Ref Range Status  07/30/2022 27.7 27.0 - 33.0 pg Final   MCV  Date Value Ref Range Status  07/30/2022 85.4 80.0 - 100.0 fL Final   No results found for: "PLTCOUNTKUC", "LABPLAT", "POCPLA" RDW  Date Value Ref Range Status  07/30/2022 14.3 11.0 - 15.0 % Final

## 2022-08-10 ENCOUNTER — Ambulatory Visit (INDEPENDENT_AMBULATORY_CARE_PROVIDER_SITE_OTHER): Payer: Medicare Other | Admitting: Family Medicine

## 2022-08-10 VITALS — BP 120/58 | HR 60 | Temp 98.4°F | Ht 67.0 in | Wt 199.0 lb

## 2022-08-10 DIAGNOSIS — E1122 Type 2 diabetes mellitus with diabetic chronic kidney disease: Secondary | ICD-10-CM | POA: Diagnosis not present

## 2022-08-10 DIAGNOSIS — N183 Chronic kidney disease, stage 3 unspecified: Secondary | ICD-10-CM

## 2022-08-10 DIAGNOSIS — I10 Essential (primary) hypertension: Secondary | ICD-10-CM

## 2022-08-10 DIAGNOSIS — Z87891 Personal history of nicotine dependence: Secondary | ICD-10-CM

## 2022-08-10 DIAGNOSIS — Z1211 Encounter for screening for malignant neoplasm of colon: Secondary | ICD-10-CM

## 2022-08-10 DIAGNOSIS — F17200 Nicotine dependence, unspecified, uncomplicated: Secondary | ICD-10-CM | POA: Diagnosis not present

## 2022-08-10 DIAGNOSIS — Z122 Encounter for screening for malignant neoplasm of respiratory organs: Secondary | ICD-10-CM

## 2022-08-10 DIAGNOSIS — E1159 Type 2 diabetes mellitus with other circulatory complications: Secondary | ICD-10-CM

## 2022-08-10 MED ORDER — PREDNISONE 20 MG PO TABS: ORAL_TABLET | ORAL | 0 refills | Status: DC

## 2022-08-10 NOTE — Progress Notes (Signed)
Subjective:    Patient ID: James Jones, male    DOB: [birth year], 77 y.o.   MRN: [id number]  HPI Patient is a very pleasant 77 year old African-American gentleman who is here today for his regular checkup.  He has a past medical history of type 2 diabetes mellitus, stage 3 B CKD.  Echo last year showed mild pulm hypertension and mild aortic stenosis but normal EF.  Recent labs showed HgA1c of 6.6, albumen/Cr of 1200, and GFR of 33. Currently on max dose valsartan and farxiga.  Recent potassium was 4.3.  Not currently on [place].   Reviewing his shot records, he is currently due for shingles vaccine as well as RSV.  I mention these to him today and he defers them at the present time.  His last colonoscopy was 2024.  We discussed the pros and cons of continued colon cancer screening given his age.  He does not want to do a colonoscopy but he would consent to Cologuard.  He quit smoking 2 years ago however he has a 30+ pack year history of smoking.  Therefore he is due for a lung cancer screening CT scan of the chest.  He also reports right-sided lumbar radiculopathy x 1 week.  It radiates from his right flank into his right lower abdomen and down his right leg.  He is unable to take NSAIDs. Lab on 07/30/2022  Component Date Value Ref Range Status   WBC 07/30/2022 5.5  3.8 - 10.8 Thousand/uL Final   RBC 07/30/2022 4.59  4.20 - 5.80 Million/uL Final   Hemoglobin 07/30/2022 12.7 (L)  13.2 - 17.1 g/dL Final   HCT 40/98/1191 39.2  38.5 - 50.0 % Final   MCV 07/30/2022 85.4  80.0 - 100.0 fL Final   MCH 07/30/2022 27.7  27.0 - 33.0 pg Final   MCHC 07/30/2022 32.4  32.0 - 36.0 g/dL Final   RDW 47/82/9562 14.3  11.0 - 15.0 % Final   Platelets 07/30/2022 192  140 - 400 Thousand/uL Final   MPV 07/30/2022 10.1  7.5 - 12.5 fL Final   Neutro Abs 07/30/2022 3,834  1,500 - 7,800 cells/uL Final   Lymphs Abs 07/30/2022 1,062  850 - 3,900 cells/uL Final   Absolute Monocytes 07/30/2022 435  200 - 950 cells/uL  Final   Eosinophils Absolute 07/30/2022 132  15 - 500 cells/uL Final   Basophils Absolute 07/30/2022 39  0 - 200 cells/uL Final   Neutrophils Relative % 07/30/2022 69.7  % Final   Total Lymphocyte 07/30/2022 19.3  % Final   Monocytes Relative 07/30/2022 7.9  % Final   Eosinophils Relative 07/30/2022 2.4  % Final   Basophils Relative 07/30/2022 0.7  % Final   Glucose, Bld 07/30/2022 103 (H)  65 - 99 mg/dL Final   Comment: .            Fasting reference interval . For someone without known diabetes, a glucose value between 100 and 125 mg/dL is consistent with prediabetes and should be confirmed with a follow-up test. .    BUN 07/30/2022 29 (H)  7 - 25 mg/dL Final   Creat 13/09/6576 2.04 (H)  0.70 - 1.28 mg/dL Final   eGFR 46/96/2952 33 (L)  > OR = 60 mL/min/1.75m2 Final   BUN/Creatinine Ratio 07/30/2022 14  6 - 22 (calc) Final   Sodium 07/30/2022 140  135 - 146 mmol/L Final   Potassium 07/30/2022 4.3  3.5 - 5.3 mmol/L Final   Chloride 07/30/2022 109  98 -  110 mmol/L Final   CO2 07/30/2022 20  20 - 32 mmol/L Final   Calcium 07/30/2022 9.5  8.6 - 10.3 mg/dL Final   Total Protein 09/81/1914 6.6  6.1 - 8.1 g/dL Final   Albumin 78/29/5621 4.3  3.6 - 5.1 g/dL Final   Globulin 30/86/5784 2.3  1.9 - 3.7 g/dL (calc) Final   AG Ratio 07/30/2022 1.9  1.0 - 2.5 (calc) Final   Total Bilirubin 07/30/2022 0.4  0.2 - 1.2 mg/dL Final   Alkaline phosphatase (APISO) 07/30/2022 76  35 - 144 U/L Final   AST 07/30/2022 29  10 - 35 U/L Final   ALT 07/30/2022 21  9 - 46 U/L Final   Hgb A1c MFr Bld 07/30/2022 6.6 (H)  <5.7 % of total Hgb Final   Comment: For someone without known diabetes, a hemoglobin A1c value of 6.5% or greater indicates that they may have  diabetes and this should be confirmed with a follow-up  test. . For someone with known diabetes, a value <7% indicates  that their diabetes is well controlled and a value  greater than or equal to 7% indicates suboptimal  control. A1c targets  should be individualized based on  duration of diabetes, age, comorbid conditions, and  other considerations. . Currently, no consensus exists regarding use of hemoglobin A1c for diagnosis of diabetes for children. .    Mean Plasma Glucose 07/30/2022 143  mg/dL Final   eAG (mmol/L) 69/62/9528 7.9  mmol/L Final   Comment: . This test was performed on the Roche cobas c503 platform. Effective 11/16/21, a change in test platforms from the Abbott Architect to the Roche cobas c503 may have shifted HbA1c results compared to historical results. Based on laboratory validation testing conducted at [facility], the Roche platform relative to the Abbott platform had an average increase in HbA1c value of < or = 0.3%. This difference is within accepted  variability established by the [facility]. Note that not all individuals will have had a shift in their results and direct comparisons between historical and current results for testing conducted on different platforms is not recommended.    Cholesterol 07/30/2022 143  <200 mg/dL Final   HDL 41/32/4401 59  > OR = 40 mg/dL Final   Triglycerides 02/72/5366 92  <150 mg/dL Final   LDL Cholesterol (Calc) 07/30/2022 66  mg/dL (calc) Final   Comment: Reference range: <100 . Desirable range <100 mg/dL for primary prevention;   <70 mg/dL for patients with CHD or diabetic patients  with > or = 2 CHD risk factors. Marland Kitchen LDL-C is now calculated using the Martin-Hopkins  calculation, which is a validated novel method providing  better accuracy than the Friedewald equation in the  estimation of LDL-C.  Horald Pollen et al. Lenox Ahr. 4403;474(25): 2061-2068  (http://education.[facility].com/faq/FAQ164)    Total CHOL/HDL Ratio 07/30/2022 2.4  <9.5 (calc) Final   Non-HDL Cholesterol (Calc) 07/30/2022 84  <130 mg/dL (calc) Final   Comment: For patients with diabetes plus 1 major ASCVD risk  factor, treating to a non-HDL-C goal  of <100 mg/dL  (LDL-C of <63 mg/dL) is considered a therapeutic  option.    Creatinine, Urine 07/30/2022 52  20 - 320 mg/dL Final   Microalb, Ur 87/56/4332 63.7  mg/dL Final   Comment: Verified by repeat analysis. Marland Kitchen Reference Range Not established    Microalb Creat Ratio 07/30/2022 1,225 (H)  <30 mg/g creat Final   Comment: . The ADA defines abnormalities in albumin excretion as  follows: Marland Kitchen Albuminuria Category        Result (mg/g creatinine) . Normal to Mildly increased   <30 Moderately increased         30-299  Severely increased           > OR = 300 . The ADA recommends that at least two of three specimens collected within a 3-6 month period be abnormal before considering a patient to be within a diagnostic category.    Vitamin B-12 07/30/2022 826  200 - 1,100 pg/mL Final    Past Medical History:  Diagnosis Date   Aortic stenosis 04/02/2021   Chronic diastolic heart failure (HCC)    CKD (chronic kidney disease) stage 2, GFR 60-89 ml/min 03/17/2012   Coronary artery disease, non-occlusive 03/18/2012   60-70% stenosis in RCA -- FFR 0.84; EF 60-65%   Diabetes mellitus without complication (HCC)    GERD (gastroesophageal reflux disease)    On PPI   History of GI bleed 02/2013   No obvious findings on EGD/colonoscopy   Hydrocele, bilateral    large on [place] 2020   Hyperlipidemia    Hypertension    Obesity (BMI 30.0-34.9) 10/02/2012   OSA (obstructive sleep apnea) 03/18/2012   Doing better with CPAP   Pulmonary hypertension (HCC) 12/10/2010   ECHO:  Mild PH,mild LVH; PA pressures estimated 30-40 mmHg   Pulmonary hypertension, unspecified (HCC) 06/04/2021   RBBB, 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   Event monitor; usually during sleeping hours; therefore not on beta blocker   Past Surgical History:  Procedure Laterality Date   APPENDECTOMY  1974   CARPAL TUNNEL RELEASE     EYE SURGERY  1960   Left eye   HEMORRHOID SURGERY     LEFT HEART CATH AND  CORONARY ANGIOGRAPHY  03/27/2010   Moderate mid RCA lesion,right radial approach,normal EF   LEFT HEART CATHETERIZATION WITH CORONARY ANGIOGRAM N/A 03/17/2012   Procedure: LEFT HEART CATHETERIZATION WITH CORONARY ANGIOGRAM;  Surgeon: Marykay Lex, MD;  Location: [facility] CATH LAB;  Service: Cardiovascular: Fractional Flow Reserve Measurement of mid RCA 60-70% stenosis = 0.84. Otherwise mild LCA CAD.  Normal EF & EDP   SHOULDER ARTHROSCOPY  2006   TRANSTHORACIC ECHOCARDIOGRAM  12/2010    Mild concentric LVH.  EF> 55%.  GR 1 DD.  Mild aortic sclerosis no stenosis.  PA pressures estimated 30-40 mmHg   Current Outpatient Medications on File Prior to Visit  Medication Sig Dispense Refill   albuterol (VENTOLIN HFA) 108 (90 Base) MCG/ACT inhaler Inhale 2 puffs into the lungs every 6 (six) hours as needed for wheezing or shortness of breath. 8 g 6   allopurinol (ZYLOPRIM) 100 MG tablet TAKE 1 TABLET BY MOUTH EVERY DAY 60 tablet 0   amLODipine (NORVASC) 10 MG tablet Take 1 tablet (10 mg total) by mouth daily. 90 tablet 3   aspirin EC 81 MG tablet Take 1 tablet (81 mg total) by mouth daily. Swallow whole. 90 tablet 3   Blood Glucose Monitoring Suppl (FREESTYLE LITE) DEVI Use to check blood sugar once daily 1 each 0   carvedilol (COREG) 25 MG tablet Take 1 tablet (25 mg total) by mouth 2 (two) times daily. 60 tablet 1   Cholecalciferol (VITAMIN D-3) 1000 UNITS CAPS Take by mouth daily. 2 capsules.     dapagliflozin propanediol (FARXIGA) 10 MG TABS tablet TAKE 1 TABLET BY MOUTH DAILY BEFORE BREAKFAST. 90 tablet 1   doxazosin (CARDURA) 8 MG tablet Take 1 tablet (8  mg total) by mouth 2 (two) times daily. 180 tablet 3   famotidine (PEPCID) 20 MG tablet Take 1 tablet (20 mg total) by mouth at bedtime. 30 tablet 11   ferrous sulfate 325 (65 FE) MG tablet Take 1 tablet (325 mg total) by mouth 2 (two) times daily with a meal. 180 tablet 3   fish oil-omega-3 fatty acids 1000 MG capsule Take 1,200 mg by mouth daily.       glucose blood (FREESTYLE LITE) test strip USE TO CHECK BLOOD SUGAR 1-2X DAILY. E11.59 200 strip 3   Multiple Vitamin (MULTIVITAMIN WITH MINERALS) TABS Take 1 tablet by mouth daily.     pantoprazole (PROTONIX) 40 MG tablet Take 1 tablet (40 mg total) by mouth daily. 30 tablet 11   PRESCRIPTION MEDICATION Uses C-PAP at bedtime     rosuvastatin (CRESTOR) 40 MG tablet Take 1 tablet (40 mg total) by mouth daily. 90 tablet 3   sodium bicarbonate 650 MG tablet Take 650 mg by mouth daily.     umeclidinium-vilanterol (ANORO ELLIPTA) 62.5-25 MCG/ACT AEPB Inhale 1 puff into the lungs daily. 7 each 0   valsartan (DIOVAN) 320 MG tablet TAKE 1 TABLET BY MOUTH EVERY DAY 90 tablet 1   No current facility-administered medications on file prior to visit.   Allergies  Allergen Reactions   Atorvastatin     myalgias   Social History   Socioeconomic History   Marital status: Married    Spouse name: Not on file   Number of children: Not on file   Years of education: Not on file   Highest education level: Not on file  Occupational History   Not on file  Tobacco Use   Smoking status: Former    Packs/day: 2.00    Years: 50.00    Additional pack years: 0.00    Total pack years: 100.00    Types: Cigarettes    Quit date: 10/09/2020    Years since quitting: 1.8   Smokeless tobacco: Never  Vaping Use   Vaping Use: Never used  Substance and Sexual Activity   Alcohol use: Yes    Alcohol/week: 1.0 standard drink of alcohol    Types: 1 Standard drinks or equivalent per week   Drug use: Yes    Types: Marijuana    Comment: cannibus, occ use   Sexual activity: Not on file  Other Topics Concern   Not on file  Social History Narrative   Married, father of 2, grandfather of 3.   He is a former smoker of about a pack to pack and half cigarettes a day -- he quit in March of this year.    He is an avid exerciser working at least 4-5 days a week doing her walking or stationary bike.   Social Determinants of  Health   Financial Resource Strain: Low Risk  (04/29/2022)   Overall Financial Resource Strain (CARDIA)    Difficulty of Paying Living Expenses: Not hard at all  Food Insecurity: No Food Insecurity (04/29/2022)   Hunger Vital Sign    Worried About Running Out of Food in the Last Year: Never true    Ran Out of Food in the Last Year: Never true  Transportation Needs: No Transportation Needs (04/29/2022)   PRAPARE - Administrator, Civil Service (Medical): No    Lack of Transportation (Non-Medical): No  Physical Activity: Sufficiently Active (04/29/2022)   Exercise Vital Sign    Days of Exercise per Week: 5 days  Minutes of Exercise per Session: 40 min  Stress: No Stress Concern Present (04/29/2022)   [employer] of Occupational Health - Occupational Stress Questionnaire    Feeling of Stress : Not at all  Social Connections: Socially Integrated (04/29/2022)   Social Connection and Isolation Panel [NHANES]    Frequency of Communication with Friends and Family: More than three times a week    Frequency of Social Gatherings with Friends and Family: More than three times a week    Attends Religious Services: More than 4 times per year    Active Member of [employer] or Organizations: Yes    Attends Engineer, structural: More than 4 times per year    Marital Status: Married  Catering manager Violence: Not At Risk (04/29/2022)   Humiliation, Afraid, Rape, and Kick questionnaire    Fear of Current or Ex-Partner: No    Emotionally Abused: No    Physically Abused: No    Sexually Abused: No   Family History  Problem Relation Age of Onset   Hypertension Mother    Kidney failure Mother    Coronary artery disease Father    Hypertension Sister    Kidney failure Sister    Stroke Maternal Grandmother      Review of Systems  All other systems reviewed and are negative.      Objective:   Physical Exam Vitals reviewed.  Constitutional:      General: He is not in acute  distress.    Appearance: He is well-developed. He is not diaphoretic.  HENT:     Head: Normocephalic and atraumatic.     Right Ear: External ear normal.     Left Ear: External ear normal.     Nose: Nose normal.     Mouth/Throat:     Pharynx: No oropharyngeal exudate.  Eyes:     General: No scleral icterus.       Right eye: No discharge.        Left eye: No discharge.     Conjunctiva/sclera: Conjunctivae normal.     Pupils: Pupils are equal, round, and reactive to light.  Neck:     Thyroid: No thyromegaly.     Vascular: No JVD.     Trachea: No tracheal deviation.  Cardiovascular:     Rate and Rhythm: Normal rate and regular rhythm.     Heart sounds: Murmur heard.     No friction rub. No gallop.  Pulmonary:     Effort: Pulmonary effort is normal. No respiratory distress.     Breath sounds: Normal breath sounds. No stridor. No wheezing or rales.  Chest:     Chest wall: No tenderness.  Abdominal:     General: Bowel sounds are normal. There is no distension.     Palpations: Abdomen is soft. There is no mass.     Tenderness: There is no abdominal tenderness. There is no guarding or rebound.  Musculoskeletal:        General: No tenderness or deformity. Normal range of motion.     Cervical back: Normal range of motion and neck supple.  Lymphadenopathy:     Cervical: No cervical adenopathy.  Skin:    General: Skin is warm.     Coloration: Skin is not pale.     Findings: No erythema or rash.  Neurological:     Mental Status: He is alert and oriented to person, place, and time.     Cranial Nerves: No cranial nerve deficit.  Motor: No abnormal muscle tone.     Coordination: Coordination normal.     Deep Tendon Reflexes: Reflexes are normal and symmetric.  Psychiatric:        Behavior: Behavior normal.        Thought Content: Thought content normal.        Judgment: Judgment normal.           Assessment & Plan:  Colon cancer screening - Plan: Cologuard  Encounter  for screening for lung cancer - Plan: CT CHEST LUNG CA SCREEN LOW DOSE W/O CM  Smoker - Plan: CT CHEST LUNG CA SCREEN LOW DOSE W/O CM  Benign essential HTN  Controlled type 2 diabetes mellitus with other circulatory complication, without long-term current use of insulin (HCC)  CKD stage 3 due to type 2 diabetes mellitus (HCC)  Former smoker Patient's blood pressure today is excellent.  His A1c is outstanding.  His LDL cholesterol is well below 100.  We discussed adding Chauncey Mann due to his chronic kidney disease.  He has an appointment in 2 weeks to meet with his nephrologist.  I recommended discussing this with them.  I recommended the shingles vaccine but he declined that today.  I recommended RSV but the patient declined that as well.  Schedule the patient for Cologuard.  Schedule him for a CT scan of the chest to screen for lung cancer.  Otherwise his preventative care is up-to-date.  I did give the patient a prednisone taper pack for his right-sided lumbar radiculopathy.

## 2022-08-13 NOTE — Addendum Note (Signed)
Addended by: Lynnea Ferrier T on: 08/13/2022 04:52 PM   Modules accepted: Level of Service

## 2022-08-15 ENCOUNTER — Other Ambulatory Visit: Payer: Self-pay | Admitting: Family Medicine

## 2022-08-16 ENCOUNTER — Encounter (HOSPITAL_COMMUNITY): Payer: Self-pay | Admitting: Emergency Medicine

## 2022-08-16 ENCOUNTER — Ambulatory Visit (HOSPITAL_COMMUNITY)
Admission: EM | Admit: 2022-08-16 | Discharge: 2022-08-16 | Disposition: A | Discharge status: Home / Self Care | Payer: Medicare Other | Attending: Internal Medicine | Admitting: Internal Medicine

## 2022-08-16 ENCOUNTER — Ambulatory Visit (INDEPENDENT_AMBULATORY_CARE_PROVIDER_SITE_OTHER): Payer: Medicare Other

## 2022-08-16 ENCOUNTER — Other Ambulatory Visit: Payer: Self-pay

## 2022-08-16 DIAGNOSIS — R079 Chest pain, unspecified: Secondary | ICD-10-CM | POA: Diagnosis not present

## 2022-08-16 DIAGNOSIS — R051 Acute cough: Secondary | ICD-10-CM | POA: Diagnosis not present

## 2022-08-16 DIAGNOSIS — Z20822 Contact with and (suspected) exposure to covid-19: Secondary | ICD-10-CM | POA: Diagnosis not present

## 2022-08-16 MED ORDER — BENZONATATE 200 MG PO CAPS: 200.0000 mg | ORAL_CAPSULE | Freq: Three times a day (TID) | ORAL | 0 refills | Status: DC | PRN

## 2022-08-16 MED ORDER — MOLNUPIRAVIR EUA 200MG CAPSULE: 4.0000 | ORAL_CAPSULE | Freq: Two times a day (BID) | ORAL | 0 refills | Status: AC

## 2022-08-16 NOTE — Telephone Encounter (Signed)
Requested Prescriptions  Pending Prescriptions Disp Refills   valsartan (DIOVAN) 320 MG tablet [Pharmacy Med Name: VALSARTAN 320 MG TABLET] 90 tablet 0    Sig: TAKE 1 TABLET BY MOUTH EVERY DAY     Cardiovascular:  Angiotensin Receptor Blockers Failed - 08/15/2022  2:19 PM      Failed - Cr in normal range and within 180 days    Creat  Date Value Ref Range Status  07/30/2022 2.04 (H) 0.70 - 1.28 mg/dL Final   Creatinine, Urine  Date Value Ref Range Status  07/30/2022 52 20 - 320 mg/dL Final         Failed - Valid encounter within last 6 months    Recent Outpatient Visits           1 year ago Benign essential HTN   [facility] Pickard, Priscille Heidelberg, MD   1 year ago Benign essential HTN   [facility] Tanya Nones, Priscille Heidelberg, MD   2 years ago Urinary frequency   [facility] Tanya Nones, Priscille Heidelberg, MD   2 years ago General medical exam   [facility] Donita Brooks, MD   2 years ago Left sided sciatica   [facility] Donita Brooks, MD              Passed - K in normal range and within 180 days    Potassium  Date Value Ref Range Status  07/30/2022 4.3 3.5 - 5.3 mmol/L Final         Passed - Patient is not pregnant      Passed - Last BP in normal range    BP Readings from Last 1 Encounters:  08/16/22 116/65

## 2022-08-16 NOTE — ED Provider Notes (Signed)
[facility]    CSN: [id number] Arrival date & time: 08/16/22  1250      History   Chief Complaint Chief Complaint  Patient presents with   Chills    HPI James Jones is a 77 y.o. male who presents due to having chills for the past 5 days, with cough, fatigue, rhinitis, body aches. He has not checked his temp. Has been having intermittent anterior chest pains for a few weeks that last for a few seconds and goes away. Has seen his PCP for this, and was told is related to his sciatica. Has past hx of being a smoker. Denies diaphoresis. Has had 5 covid shots.  His wife tested + for covid in the ER yesterday. He had 2 negative home Covid test last night and this am.    Past Medical History:  Diagnosis Date   Aortic stenosis 04/02/2021   Chronic diastolic heart failure (HCC)    CKD (chronic kidney disease) stage 2, GFR 60-89 ml/min 03/17/2012   Coronary artery disease, non-occlusive 03/18/2012   60-70% stenosis in RCA -- FFR 0.84; EF 60-65%   Diabetes mellitus without complication (HCC)    GERD (gastroesophageal reflux disease)    On PPI   History of GI bleed 02/2013   No obvious findings on EGD/colonoscopy   Hydrocele, bilateral    large on [place] 2020   Hyperlipidemia    Hypertension    Obesity (BMI 30.0-34.9) 10/02/2012   OSA (obstructive sleep apnea) 03/18/2012   Doing better with CPAP   Pulmonary hypertension (HCC) 12/10/2010   ECHO:  Mild PH,mild LVH; PA pressures estimated 30-40 mmHg   Pulmonary hypertension, unspecified (HCC) 06/04/2021   RBBB, 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   Event monitor; usually during sleeping hours; therefore not on beta blocker    Patient Active Problem List   Diagnosis Date Noted   Atypical chest pain 10/05/2021   GERD (gastroesophageal reflux disease)    Chronic diastolic heart failure (HCC)    Stage 3b chronic kidney disease (CKD) (HCC)    Pulmonary hypertension, unspecified (HCC) 06/04/2021   Aortic stenosis  04/02/2021   History of anemia 01/27/2021   Class 1 obesity with serious comorbidity and body mass index (BMI) of 30.0 to 30.9 in adult 02/13/2019   Heartburn 03/24/2016   OSA on CPAP 08/02/2015   Controlled diabetes mellitus with circulatory complication, without long-term current use of insulin (HCC) 04/29/2014   Acute blood loss anemia 02/13/2013   Dyslipidemia, goal LDL below 70 10/15/2012   Obesity (BMI 30.0-34.9) 10/02/2012   AV block, 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), 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 03/18/2012   CAD (coronary artery disease), with 60-70% stenosis in RCA 03/18/2012   Essential hypertension 03/17/2012   RBBB 03/17/2012   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 Otherwise mild LCA CAD.  Normal EF & EDP   SHOULDER ARTHROSCOPY  2006   TRANSTHORACIC  ECHOCARDIOGRAM  12/2010    Mild concentric LVH.  EF> 55%.  GR 1 DD.  Mild aortic sclerosis no stenosis.  PA pressures estimated 30-40 mmHg       Home Medications    Prior to Admission medications   Medication Sig Start Date End Date Taking? Authorizing Provider  benzonatate (TESSALON) 200 MG capsule Take 1 capsule (200 mg total) by mouth 3 (three) times daily as needed for cough. 08/16/22  Yes Rodriguez-Southworth, Nettie Elm, PA-C  molnupiravir EUA (LAGEVRIO) 200 mg CAPS capsule Take 4 capsules (800 mg total) by mouth 2 (two) times daily for 5 days. 08/16/22 08/21/22 Yes  Rodriguez-Southworth, Nettie Elm, PA-C  albuterol (VENTOLIN HFA) 108 (90 Base) MCG/ACT inhaler Inhale 2 puffs into the lungs every 6 (six) hours as needed for wheezing or shortness of breath. 08/27/21   Omar Person, MD  allopurinol (ZYLOPRIM) 100 MG tablet TAKE 1 TABLET BY MOUTH EVERY DAY 08/09/22   Donita Brooks, MD  amLODipine (NORVASC) 10 MG tablet Take 1 tablet (10 mg total) by mouth daily. 02/23/22   Donita Brooks, MD  aspirin EC 81 MG tablet Take 1 tablet (81 mg total) by mouth daily. Swallow whole. 10/16/21   Alver Sorrow, NP  Blood Glucose Monitoring Suppl (FREESTYLE LITE) DEVI Use to check blood sugar once daily 11/18/17   Carlus Pavlov, MD  carvedilol (COREG) 25 MG tablet Take 1 tablet (25 mg total) by mouth 2 (two) times daily. 07/26/22   Donita Brooks, MD  Cholecalciferol (VITAMIN D-3) 1000 UNITS CAPS Take by mouth daily. 2 capsules.    [provider]  dapagliflozin propanediol (FARXIGA) 10 MG TABS tablet TAKE 1 TABLET BY MOUTH DAILY BEFORE BREAKFAST. 04/07/22   Carlus Pavlov, MD  doxazosin (CARDURA) 8 MG tablet Take 1 tablet (8 mg total) by mouth 2 (two) times daily. 11/06/21   Alver Sorrow, NP  famotidine (PEPCID) 20 MG tablet Take 1 tablet (20 mg total) by mouth at bedtime. 05/10/22   Alver Sorrow, NP  ferrous sulfate 325 (65 FE) MG tablet Take 1 tablet (325 mg total) by mouth 2 (two) times daily with a meal. 06/22/19   Donita Brooks, MD  fish oil-omega-3 fatty acids 1000 MG capsule Take 1,200 mg by mouth daily.     [provider]  glucose blood (FREESTYLE LITE) test strip USE TO CHECK BLOOD SUGAR 1-2X DAILY. E11.59 12/16/21   Carlus Pavlov, MD  Multiple Vitamin (MULTIVITAMIN WITH MINERALS) TABS Take 1 tablet by mouth daily.    [provider]  pantoprazole (PROTONIX) 40 MG tablet Take 1 tablet (40 mg total) by mouth daily. 05/10/22   Alver Sorrow, NP  PRESCRIPTION MEDICATION Uses C-PAP at bedtime    [provider]  rosuvastatin (CRESTOR) 40 MG tablet Take 1 tablet (40 mg total) by mouth daily. 10/16/21   Donita Brooks, MD  sodium bicarbonate 650 MG tablet Take 650 mg by mouth daily. 04/23/22   [provider]  umeclidinium-vilanterol (ANORO ELLIPTA) 62.5-25 MCG/ACT AEPB Inhale 1 puff into the lungs daily. 08/27/21   Omar Person, MD  valsartan (DIOVAN) 320 MG tablet TAKE 1 TABLET BY MOUTH EVERY DAY 08/16/22   Donita Brooks, MD    Family History Family History  Problem Relation Age of Onset   Hypertension Mother    Kidney failure Mother    Coronary artery disease Father    Hypertension Sister    Kidney failure Sister    Stroke  Maternal Grandmother     Social History Social History   Tobacco Use   Smoking status: Former    Packs/day: 2.00    Years: 50.00    Additional pack years: 0.00    Total pack years: 100.00    Types: Cigarettes    Quit date: 10/09/2020    Years since quitting: 1.8   Smokeless tobacco: Never  Vaping Use   Vaping Use: Never used  Substance Use Topics   Alcohol use: Yes    Alcohol/week: 1.0 standard drink of alcohol    Types: 1 Standard drinks or equivalent per week   Drug use: Yes    Types: Marijuana    Comment: cannibus, occ use     Allergies   Atorvastatin   Review of Systems Review of Systems As noted in HPI  Physical Exam Triage Vital Signs ED Triage Vitals  Enc Vitals Group     BP 08/16/22 1346 116/65     Pulse Rate 08/16/22 1346 72     Resp 08/16/22 1346 18     Temp 08/16/22 1346 98.7 F (37.1 C)     Temp Source 08/16/22 1346 Oral     SpO2 08/16/22 1346 94 %     Weight --      Height --      Head Circumference --      Peak Flow --      Pain Score 08/16/22 1342 7     Pain Loc --      Pain Edu? --      Excl. in GC? --    No data found.  Updated Vital Signs BP 116/65 (BP Location: Right Arm)   Pulse 72   Temp 98.7 F (37.1 C) (Oral)   Resp 18   SpO2 94%  I repeated his pulse ox after coughing and deep breaths  and went up to 97% Visual Acuity Right Eye Distance:   Left Eye Distance:   Bilateral Distance:    Right Eye Near:   Left Eye Near:    Bilateral Near:     Physical Exam Vitals and nursing note reviewed.  Constitutional:      General: He is not in acute distress.    Appearance: He is normal weight. He is not toxic-appearing.  HENT:     Right Ear: Tympanic membrane, ear canal and external ear normal.     Left Ear: Tympanic membrane, ear canal and external ear normal.     Nose: Nose normal.  Eyes:     General: No scleral icterus.    Conjunctiva/sclera: Conjunctivae normal.  Cardiovascular:     Rate and Rhythm: Normal rate.     Heart sounds: Murmur heard.  Pulmonary:     Effort: Pulmonary effort is normal.     Breath sounds: Normal breath sounds.  Chest:     Chest wall: No tenderness.  Musculoskeletal:        General: Normal range of motion.     Cervical back: Neck supple.  Lymphadenopathy:     Cervical: No cervical adenopathy.  Skin:    General: Skin is warm and dry.  Neurological:     Mental Status: He is alert and oriented to person, place, and time.     Gait: Gait normal.  Psychiatric:        Mood and Affect: Mood normal.        Behavior: Behavior normal.        Thought Content: Thought content normal.  Judgment: Judgment normal.      UC Treatments / Results  Labs (all labs ordered are listed, but only abnormal results are displayed) Labs Reviewed  SARS CORONAVIRUS 2 (TAT 6-24 HRS)    EKG   Radiology DG Chest 2 View  Result Date: 08/16/2022 CLINICAL DATA:  anterior chest pains off and on, exposure to covid EXAM: CHEST - 2 VIEW COMPARISON:  CXR 10/05/21 FINDINGS: No pleural effusion. No pneumothorax. No focal airspace opacity. Normal cardiac and mediastinal contours. No radiographically apparent displaced rib fractures. Visualized upper abdomen is unremarkable. Degenerative changes of the bilateral glenohumeral joints vertebral body heights are  maintained. IMPRESSION: No focal airspace opacity Electronically Signed   By: Lorenza Cambridge M.D.   On: 08/16/2022 14:30    Procedures Procedures (including critical care time)  Medications Ordered in UC Medications - No data to display  Initial Impression / Assessment and Plan / UC Course  I have reviewed the triage vital signs and the nursing notes.  Pertinent  imaging results that were available during my care of the patient were reviewed by me and considered in my medical decision making (see chart for details).  Exposure to covid Flu like symptoms  Since it is day 5 on his symptoms and we may not have the final results til tomorrow, I went ahead and started him on Molnupiravir as noted. Told he needs to start it today. I also prescribed him Tessalon perless as noted.  We will inform him of the results when back.    Final Clinical Impressions(s) / UC Diagnoses   Final diagnoses:  Close exposure to COVID-19 virus  Acute cough     Discharge Instructions      Your chest xray is normal I have sent the antiviral medication for Covid and something for cough      ED Prescriptions     Medication Sig Dispense Auth. Provider   molnupiravir EUA (LAGEVRIO) 200 mg CAPS capsule Take 4 capsules (800 mg total) by mouth 2 (two) times daily for 5 days. 40 capsule Rodriguez-Southworth, Candita Borenstein, PA-C   benzonatate (TESSALON) 200 MG capsule Take 1 capsule (200 mg total) by mouth 3 (three) times daily as needed for cough. 30 capsule Rodriguez-Southworth, Nettie Elm, PA-C      PDMP not reviewed this encounter.   Garey Ham, PA-C 08/16/22 1504

## 2022-08-16 NOTE — Discharge Instructions (Signed)
Your chest xray is normal I have sent the antiviral medication for Covid and something for cough

## 2022-08-16 NOTE — ED Triage Notes (Addendum)
For the last 5 days, patient is having chills.  Reports "striking" pains through chest and left flank area.  Reports pcp told him he had a pinched nerve in back.   Reports wife was diagnosed in ED with Covid yesterday.  Patient says she has similar symptoms  Finished a prednisone taper today.  Recently has been in [place].    Reports a home covid test yesterday was negative

## 2022-08-17 ENCOUNTER — Telehealth: Payer: Self-pay

## 2022-08-17 LAB — SARS CORONAVIRUS 2 (TAT 6-24 HRS): SARS Coronavirus 2: POSITIVE — AB

## 2022-08-17 NOTE — Telephone Encounter (Signed)
Pt tested + for Covid and was given Moulniprivir for tx by urgent care. Pt states he is unable to afford the medication. Pt states wife also tested + for but was given a Prednisone dose pack. Pt asks if he can have a dose pack instead? Thanks.

## 2022-08-23 DIAGNOSIS — N1832 Chronic kidney disease, stage 3b: Secondary | ICD-10-CM | POA: Diagnosis not present

## 2022-08-23 DIAGNOSIS — Z1211 Encounter for screening for malignant neoplasm of colon: Secondary | ICD-10-CM | POA: Diagnosis not present

## 2022-08-24 ENCOUNTER — Encounter: Payer: Self-pay | Admitting: Internal Medicine

## 2022-08-24 ENCOUNTER — Ambulatory Visit (INDEPENDENT_AMBULATORY_CARE_PROVIDER_SITE_OTHER): Payer: Medicare Other | Admitting: Internal Medicine

## 2022-08-24 VITALS — BP 130/86 | HR 66 | Ht 67.0 in | Wt 192.6 lb

## 2022-08-24 DIAGNOSIS — E119 Type 2 diabetes mellitus without complications: Secondary | ICD-10-CM

## 2022-08-24 DIAGNOSIS — Z6830 Body mass index (BMI) 30.0-30.9, adult: Secondary | ICD-10-CM

## 2022-08-24 DIAGNOSIS — E1159 Type 2 diabetes mellitus with other circulatory complications: Secondary | ICD-10-CM | POA: Diagnosis not present

## 2022-08-24 DIAGNOSIS — E669 Obesity, unspecified: Secondary | ICD-10-CM | POA: Diagnosis not present

## 2022-08-24 DIAGNOSIS — Z7984 Long term (current) use of oral hypoglycemic drugs: Secondary | ICD-10-CM

## 2022-08-24 DIAGNOSIS — E785 Hyperlipidemia, unspecified: Secondary | ICD-10-CM | POA: Diagnosis not present

## 2022-08-24 MED ORDER — DAPAGLIFLOZIN PROPANEDIOL 10 MG PO TABS: 10.0000 mg | ORAL_TABLET | Freq: Every day | ORAL | 3 refills | Status: DC

## 2022-08-24 NOTE — Progress Notes (Signed)
Patient ID: James Jones, male   DOB: 06/26/1945, 77 y.o.   MRN: [id number]  HPI: James Jones is a 77 y.o.-year-old male, returning for f/u for DM2, dx in ~2005, non-insulin-dependent, lately more controlled, with complications (CAD, CKD). Last visit 4 months ago. PCP: Dr. Lynnea Ferrier  Interim history: No blurry vision, nausea, chest pain.  He has occasional numbness in his toes. He went to [place] 2 weeks ago >> got Covid 19 >> Prednisone taper >> sugars higher, but improving.   Reviewed HbA1c levels: Lab Results  Component Value Date   HGBA1C 6.6 (H) 07/30/2022   HGBA1C 6.9 (A) 04/22/2022   HGBA1C 6.5 (A) 09/08/2021  04/11/2014: HbA1c 8.4% 01/08/2014: HbA1c 7.7% He had steroid inj for gout in his elbows - 12/2013. He also had a steroid inj in knee fall 2016, too.   He is on: - Farxiga 10 mg daily - started by PCP - started 1 mo ago - very expensive (600$ for 3 mo >> now cheaper) We had to stop Januvia since last visit due to price.  Tradjenta was also not covered. Previously on low-dose Metformin ER, but stopped 07/2018 due to CKD Had nausea, vomiting, loss of appetite with regular metformin.  He was on Actos. He was on insulin before (Lantus) - came off years ago. We stopped glipizide ER 2.5 mg daily in 09/2021.  Pt checks his sugars 1-2 times a day,: - am: 97-131, 136 >> 91-122, 134 (pasta)>> 106-123 >> 105-147, 161 (steroid) - 2h after b'fast: 119, 160 >> n/c >> 119, 133 >> n/c >> 118 >> n/c - before lunch:  109-148 >> 133 >> 154 >> 94 >> 89-134 >> 227  (steroid) - 2h after lunch: 110 >> 124-179 >> 99 >> 65, 78-120 >> 102-149 >> 120 - before dinner:  82 >> n/c >> 93-97, 175 (grapes) >> 87, 109, 190 (steroid) - 2h after dinner: 107, 164 >> 118, 128 >> 112, 131 >> n/c >> 187 >> n/c - bedtime: 109-142, 187 >> 98-139 >> 104 >> 103-150 >> 88, 172 >> 52, 122 - nighttimen/c >> 100 >> n/c  >> 122 >> n/c >> 174 Lowest 54 x1 >>... 91 >> 82>> 65 >> 88 >> 52 x1 (no appetite, did  not eat all day); he has hypoglycemia awareness in the 70s. Highest sugar was 285 (Prednisone) >> .Marland Kitchen. 179 >> 154 >> 150 >> 187 >> 227 (Prednisone)  Glucometer: Freestyle [facility] did not cover Freestyle Libre CGM.  Pt's meals are: - Breakfast: bacon + eggs, sausage, grits, oatmeal, cereal, toast wheat - Lunch: BLT, soups, fruit, nuts - Dinner: chicken, pork + greens, rice  - Snacks: 1 a day: pretzels; carrots  Previously exercising by walking several times a week, but stopped exercising due to the coronavirus pandemic. Walking 6 mi MWF at the [facility].  -+ CKD-sees nephrology (Dr. Vallery Sa) (renal ultrasound showed renal cysts), latest BUN/creatinine:  Lab Results  Component Value Date   BUN 29 (H) 07/30/2022   CREATININE 2.04 (H) 07/30/2022  05/24/2014: 34/1.64 01/08/2014: 18/1.15 On valsartan.  -+ HL; last set of lipids: Lab Results  Component Value Date   CHOL 143 07/30/2022   HDL 59 07/30/2022   LDLCALC 66 07/30/2022   TRIG 92 07/30/2022   CHOLHDL 2.4 07/30/2022  On rosuvastatin 40, fish oil.  He still has some muscle aches, improved.  He had more muscle aches when he was on pravastatin 40 before.  - last eye exam was 05/11/2022: No DR.  He  has history of cataract surgery in 2016 and 17.   - no numbness and tingling in his feet. + Occasional pain in hands and feet. Last foot exam 08/10/2022.  Latest TSH was normal: Lab Results  Component Value Date   TSH 1.740 10/02/2020   ROS: + see HPI  I reviewed pt's medications, allergies, PMH, social hx, family hx, and changes were documented in the history of present illness. Otherwise, unchanged from my initial visit note.  Past Medical History:  Diagnosis Date   Aortic stenosis 04/02/2021   Chronic diastolic heart failure (HCC)    CKD (chronic kidney disease) stage 2, GFR 60-89 ml/min 03/17/2012   Coronary artery disease, non-occlusive 03/18/2012   60-70% stenosis in RCA -- FFR 0.84; EF 60-65%   Diabetes mellitus without  complication (HCC)    GERD (gastroesophageal reflux disease)    On PPI   History of GI bleed 02/2013   No obvious findings on EGD/colonoscopy   Hydrocele, bilateral    large on [place] 2020   Hyperlipidemia    Hypertension    Obesity (BMI 30.0-34.9) 10/02/2012   OSA (obstructive sleep apnea) 03/18/2012   Doing better with CPAP   Pulmonary hypertension (HCC) 12/10/2010   ECHO:  Mild PH,mild LVH; PA pressures estimated 30-40 mmHg   Pulmonary hypertension, unspecified (HCC) 06/04/2021   RBBB, 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   Event monitor; usually during sleeping hours; therefore not on beta blocker  + Gout  Past Surgical History:  Procedure Laterality Date   APPENDECTOMY  1974   CARPAL TUNNEL RELEASE     EYE SURGERY  1960   Left eye   HEMORRHOID SURGERY     LEFT HEART CATH AND CORONARY ANGIOGRAPHY  03/27/2010   Moderate mid RCA lesion,right radial approach,normal EF   LEFT HEART CATHETERIZATION WITH CORONARY ANGIOGRAM N/A 03/17/2012   Procedure: LEFT HEART CATHETERIZATION WITH CORONARY ANGIOGRAM;  Surgeon: Marykay Lex, MD;  Location: [facility] CATH LAB;  Service: Cardiovascular: Fractional Flow Reserve Measurement of mid RCA 60-70% stenosis = 0.84. Otherwise mild LCA CAD.  Normal EF & EDP   SHOULDER ARTHROSCOPY  2006   TRANSTHORACIC ECHOCARDIOGRAM  12/2010    Mild concentric LVH.  EF> 55%.  GR 1 DD.  Mild aortic sclerosis no stenosis.  PA pressures estimated 30-40 mmHg   History   Social History   Marital Status: Married    Spouse Name: N/A   Occupational History   retired   Social History Main Topics   Smoking status: Former Smoker -- 2.00 packs/day    Quit date: 04/12/2012   Smokeless tobacco: Never Used   Alcohol Use: 0.5 oz/week    1 drink(s) per week   Drug Use: Yes    Special: Marijuana     Comment: cannibus   Social History Narrative   Married, father of 2, grandfather of 3.   He is a former smoker of about a pack to pack and half cigarettes a day  -- he quit in March of this year.    He is an avid exerciser working at least 4-5 days a week doing her walking or stationary bike.   Current Outpatient Medications on File Prior to Visit  Medication Sig Dispense Refill   albuterol (VENTOLIN HFA) 108 (90 Base) MCG/ACT inhaler Inhale 2 puffs into the lungs every 6 (six) hours as needed for wheezing or shortness of breath. 8 g 6   allopurinol (ZYLOPRIM) 100 MG tablet TAKE 1 TABLET BY MOUTH EVERY DAY  60 tablet 0   amLODipine (NORVASC) 10 MG tablet Take 1 tablet (10 mg total) by mouth daily. 90 tablet 3   aspirin EC 81 MG tablet Take 1 tablet (81 mg total) by mouth daily. Swallow whole. 90 tablet 3   benzonatate (TESSALON) 200 MG capsule Take 1 capsule (200 mg total) by mouth 3 (three) times daily as needed for cough. 30 capsule 0   Blood Glucose Monitoring Suppl (FREESTYLE LITE) DEVI Use to check blood sugar once daily 1 each 0   carvedilol (COREG) 25 MG tablet Take 1 tablet (25 mg total) by mouth 2 (two) times daily. 60 tablet 1   Cholecalciferol (VITAMIN D-3) 1000 UNITS CAPS Take by mouth daily. 2 capsules.     dapagliflozin propanediol (FARXIGA) 10 MG TABS tablet TAKE 1 TABLET BY MOUTH DAILY BEFORE BREAKFAST. 90 tablet 1   doxazosin (CARDURA) 8 MG tablet Take 1 tablet (8 mg total) by mouth 2 (two) times daily. 180 tablet 3   famotidine (PEPCID) 20 MG tablet Take 1 tablet (20 mg total) by mouth at bedtime. 30 tablet 11   ferrous sulfate 325 (65 FE) MG tablet Take 1 tablet (325 mg total) by mouth 2 (two) times daily with a meal. 180 tablet 3   fish oil-omega-3 fatty acids 1000 MG capsule Take 1,200 mg by mouth daily.      glucose blood (FREESTYLE LITE) test strip USE TO CHECK BLOOD SUGAR 1-2X DAILY. E11.59 200 strip 3   Multiple Vitamin (MULTIVITAMIN WITH MINERALS) TABS Take 1 tablet by mouth daily.     pantoprazole (PROTONIX) 40 MG tablet Take 1 tablet (40 mg total) by mouth daily. 30 tablet 11   PRESCRIPTION MEDICATION Uses C-PAP at bedtime      rosuvastatin (CRESTOR) 40 MG tablet Take 1 tablet (40 mg total) by mouth daily. 90 tablet 3   sodium bicarbonate 650 MG tablet Take 650 mg by mouth daily.     umeclidinium-vilanterol (ANORO ELLIPTA) 62.5-25 MCG/ACT AEPB Inhale 1 puff into the lungs daily. 7 each 0   valsartan (DIOVAN) 320 MG tablet TAKE 1 TABLET BY MOUTH EVERY DAY 90 tablet 0   No current facility-administered medications on file prior to visit.   Allergies  Allergen Reactions   Atorvastatin     myalgias   Family History  Problem Relation Age of Onset   Hypertension Mother    Kidney failure Mother    Coronary artery disease Father    Hypertension Sister    Kidney failure Sister    Stroke Maternal Grandmother    PE: BP 130/86   Pulse 66   Ht 5\' 7"  (1.702 m)   Wt 192 lb 9.6 oz (87.4 kg)   SpO2 99%   BMI 30.17 kg/m   Wt Readings from Last 3 Encounters:  08/24/22 192 lb 9.6 oz (87.4 kg)  08/10/22 199 lb (90.3 kg)  04/29/22 202 lb (91.6 kg)   Constitutional: overweight, in NAD Eyes: EOMI, no exophthalmos ENT: no thyromegaly, no cervical lymphadenopathy Cardiovascular: RRR, No RG, + 2/6 SEM Respiratory: CTA B Musculoskeletal: no deformities Skin: no rashes Neurological: no tremor with outstretched hands  ASSESSMENT: 1. DM2, non-insulin-dependent, uncontrolled, with complications - CAD - cardiologist Dr. Herbie Baltimore - CKD - was seeing Dr Kristian Covey >> Dr. Vallery Sa  2. HL  3.  Obesity class I  4. HTN  PLAN:  1. Patient with longstanding, previously uncontrolled type 2 diabetes, with much improved control in the last several years.  He was previously on  DPP 4 inhibitor and low-dose sulfonylurea, which we were able to stop due to good control.  We have him on SGLT2 inhibitor, [name], which is now affordable.  At last visit, sugars are mostly at goal with only occasional mild hyperglycemic spikes.  We continued [place]. -At last visit, HbA1c was higher, at 6.9%, but he had another HbA1c obtained last month  and this was better, at 6.6%. -At today's visit, his blood sugar log, she after starting prednisone, but he did have a low blood sugar at 52 after he got COVID due to decreased appetite.  Sugars started to improve in the last few days.  I do not feel we need to change his regimen for now.  I refilled his [name] for the next year. - I suggested to:  Patient Instructions  Please continue: - Farxiga 10 mg before b'fast.  Please return in 4 months with your sugar log.   - advised to check sugars at different times of the day - 1x a day, rotating check times - advised for yearly eye exams >> he is UTD - return to clinic in 4 months  2. HL -Reviewed latest lipid panel from 07/2022: LDL slightly above our target of less than 55 but otherwise lipids at goal: Lab Results  Component Value Date   CHOL 143 07/30/2022   HDL 59 07/30/2022   LDLCALC 66 07/30/2022   TRIG 92 07/30/2022   CHOLHDL 2.4 07/30/2022  -He had myalgias on atorvastatin and also on rosuvastatin, but tolerates well Crestor 40 mg daily -Discussed in the past about cutting down eggs.  He was eating 2 every day.  3. Obesity class I -He continues on the SGLT2 inhibitor (Farxiga 10 mg daily) which should also help with weight loss -We discussed in the past about cutting down fatty foods -he lost 15 pounds before the last 4 visits combined -He lost another 10 pounds since last visit  Carlus Pavlov, MD PhD [facility] Endocrinology

## 2022-08-24 NOTE — Patient Instructions (Addendum)
Please continue: - Farxiga 10 mg before b'fast.  Please return in 4-6 months with your sugar log.

## 2022-08-29 LAB — COLOGUARD: COLOGUARD: NEGATIVE

## 2022-08-31 ENCOUNTER — Other Ambulatory Visit: Payer: Self-pay

## 2022-08-31 ENCOUNTER — Other Ambulatory Visit: Payer: Self-pay | Admitting: Family Medicine

## 2022-08-31 DIAGNOSIS — E1122 Type 2 diabetes mellitus with diabetic chronic kidney disease: Secondary | ICD-10-CM | POA: Diagnosis not present

## 2022-08-31 DIAGNOSIS — I10 Essential (primary) hypertension: Secondary | ICD-10-CM

## 2022-08-31 DIAGNOSIS — I2510 Atherosclerotic heart disease of native coronary artery without angina pectoris: Secondary | ICD-10-CM

## 2022-08-31 DIAGNOSIS — I272 Pulmonary hypertension, unspecified: Secondary | ICD-10-CM | POA: Diagnosis not present

## 2022-08-31 DIAGNOSIS — R809 Proteinuria, unspecified: Secondary | ICD-10-CM | POA: Diagnosis not present

## 2022-08-31 DIAGNOSIS — I129 Hypertensive chronic kidney disease with stage 1 through stage 4 chronic kidney disease, or unspecified chronic kidney disease: Secondary | ICD-10-CM | POA: Diagnosis not present

## 2022-08-31 DIAGNOSIS — K219 Gastro-esophageal reflux disease without esophagitis: Secondary | ICD-10-CM | POA: Diagnosis not present

## 2022-08-31 DIAGNOSIS — E1129 Type 2 diabetes mellitus with other diabetic kidney complication: Secondary | ICD-10-CM | POA: Diagnosis not present

## 2022-08-31 DIAGNOSIS — E872 Acidosis, unspecified: Secondary | ICD-10-CM | POA: Diagnosis not present

## 2022-08-31 DIAGNOSIS — Z1211 Encounter for screening for malignant neoplasm of colon: Secondary | ICD-10-CM | POA: Insufficient documentation

## 2022-08-31 DIAGNOSIS — N1832 Chronic kidney disease, stage 3b: Secondary | ICD-10-CM | POA: Diagnosis not present

## 2022-08-31 MED ORDER — CARVEDILOL 25 MG PO TABS
25.0000 mg | ORAL_TABLET | Freq: Two times a day (BID) | ORAL | 1 refills | Status: DC
Start: 2022-08-31 — End: 2023-02-18

## 2022-09-02 ENCOUNTER — Ambulatory Visit (HOSPITAL_COMMUNITY)
Admission: RE | Admit: 2022-09-02 | Discharge: 2022-09-02 | Disposition: A | Discharge status: Home / Self Care | Payer: Medicare Other | Source: Ambulatory Visit | Attending: Family Medicine | Admitting: Family Medicine

## 2022-09-02 DIAGNOSIS — N289 Disorder of kidney and ureter, unspecified: Secondary | ICD-10-CM | POA: Diagnosis not present

## 2022-09-02 DIAGNOSIS — J439 Emphysema, unspecified: Secondary | ICD-10-CM | POA: Insufficient documentation

## 2022-09-02 DIAGNOSIS — F17200 Nicotine dependence, unspecified, uncomplicated: Secondary | ICD-10-CM

## 2022-09-02 DIAGNOSIS — Z87891 Personal history of nicotine dependence: Secondary | ICD-10-CM | POA: Diagnosis not present

## 2022-09-02 DIAGNOSIS — I700 Atherosclerosis of aorta: Secondary | ICD-10-CM | POA: Diagnosis not present

## 2022-09-02 DIAGNOSIS — Z122 Encounter for screening for malignant neoplasm of respiratory organs: Secondary | ICD-10-CM | POA: Diagnosis not present

## 2022-09-02 DIAGNOSIS — I2510 Atherosclerotic heart disease of native coronary artery without angina pectoris: Secondary | ICD-10-CM | POA: Insufficient documentation

## 2022-09-17 ENCOUNTER — Other Ambulatory Visit: Payer: Self-pay | Admitting: Family Medicine

## 2022-09-20 NOTE — Telephone Encounter (Signed)
Requested Prescriptions  Pending Prescriptions Disp Refills   rosuvastatin (CRESTOR) 40 MG tablet [Pharmacy Med Name: ROSUVASTATIN CALCIUM 40 MG TAB] 90 tablet 1    Sig: TAKE 1 TABLET BY MOUTH EVERY DAY     Cardiovascular:  Antilipid - Statins 2 Failed - 09/17/2022  3:33 PM      Failed - Cr in normal range and within 360 days    Creat  Date Value Ref Range Status  07/30/2022 2.04 (H) 0.70 - 1.28 mg/dL Final   Creatinine, Urine  Date Value Ref Range Status  07/30/2022 52 20 - 320 mg/dL Final         Failed - Valid encounter within last 12 months    Recent Outpatient Visits           1 year ago Benign essential HTN   [facility] Pickard, Priscille Heidelberg, MD   1 year ago Benign essential HTN   [facility] Pickard, Priscille Heidelberg, MD   2 years ago Urinary frequency   [facility] [place]ard, Priscille Heidelberg, MD   2 years ago General medical exam   [facility] Donita Brooks, MD   2 years ago Left sided sciatica   [facility] Pickard, Priscille Heidelberg, MD       Future Appointments             In 2 months Alver Sorrow, NP [facility] at [facility]   In 3 months Lennette Bihari, MD [facility] at [facility]            Failed - Lipid Panel in normal range within the last 12 months    Cholesterol, Total  Date Value Ref Range Status  12/25/2020 140 100 - 199 mg/dL Final   Cholesterol  Date Value Ref Range Status  07/30/2022 143 <200 mg/dL Final   LDL Cholesterol (Calc)  Date Value Ref Range Status  07/30/2022 66 mg/dL (calc) Final    Comment:    Reference range: <100 . Desirable range <100 mg/dL for primary prevention;   <70 mg/dL for patients with CHD or diabetic patients  with > or = 2 CHD risk factors. Marland Kitchen LDL-C is now calculated using the Martin-Hopkins  calculation, which is a validated novel method providing  better accuracy than the Friedewald equation  in the  estimation of LDL-C.  Horald Pollen et al. Lenox Ahr. 1610;960(45): 2061-2068  (http://education.[employer].com/faq/FAQ164)    HDL  Date Value Ref Range Status  07/30/2022 59 > OR = 40 mg/dL Final  40/98/1191 49 >47 mg/dL Final   Triglycerides  Date Value Ref Range Status  07/30/2022 92 <150 mg/dL Final         Passed - Patient is not pregnant

## 2022-10-14 ENCOUNTER — Other Ambulatory Visit: Payer: Self-pay

## 2022-10-14 ENCOUNTER — Telehealth: Payer: Self-pay | Admitting: Family Medicine

## 2022-10-14 DIAGNOSIS — M109 Gout, unspecified: Secondary | ICD-10-CM

## 2022-10-14 MED ORDER — ALLOPURINOL 100 MG PO TABS
100.0000 mg | ORAL_TABLET | Freq: Every day | ORAL | 1 refills | Status: DC
Start: 2022-10-14 — End: 2023-03-14

## 2022-10-14 NOTE — Telephone Encounter (Signed)
Prescription Request  10/14/2022  LOV: 08/10/2022  What is the name of the medication or equipment? allopurinol (ZYLOPRIM) 100 MG tablet   Have you contacted your pharmacy to request a refill? Yes   Which pharmacy would you like this sent to?  [id number] [place], [place] - [address] [place] [address] Phone: [phone] Fax: [phone]    Patient notified that their request is being sent to the clinical staff for review and that they should receive a response within 2 business days.   Please advise at [facility] [phone]

## 2022-10-18 ENCOUNTER — Emergency Department (HOSPITAL_COMMUNITY)
Admission: EM | Admit: 2022-10-18 | Discharge: 2022-10-18 | Disposition: A | Discharge status: Home / Self Care | Payer: Medicare Other | Attending: Emergency Medicine | Admitting: Emergency Medicine

## 2022-10-18 ENCOUNTER — Encounter (HOSPITAL_COMMUNITY): Payer: Self-pay

## 2022-10-18 ENCOUNTER — Other Ambulatory Visit: Payer: Self-pay

## 2022-10-18 ENCOUNTER — Emergency Department (HOSPITAL_COMMUNITY): Payer: Medicare Other

## 2022-10-18 DIAGNOSIS — E876 Hypokalemia: Secondary | ICD-10-CM | POA: Insufficient documentation

## 2022-10-18 DIAGNOSIS — R0789 Other chest pain: Secondary | ICD-10-CM | POA: Diagnosis present

## 2022-10-18 DIAGNOSIS — I130 Hypertensive heart and chronic kidney disease with heart failure and stage 1 through stage 4 chronic kidney disease, or unspecified chronic kidney disease: Secondary | ICD-10-CM | POA: Insufficient documentation

## 2022-10-18 DIAGNOSIS — E1122 Type 2 diabetes mellitus with diabetic chronic kidney disease: Secondary | ICD-10-CM | POA: Diagnosis not present

## 2022-10-18 DIAGNOSIS — I509 Heart failure, unspecified: Secondary | ICD-10-CM | POA: Diagnosis not present

## 2022-10-18 DIAGNOSIS — R079 Chest pain, unspecified: Secondary | ICD-10-CM | POA: Diagnosis not present

## 2022-10-18 DIAGNOSIS — D649 Anemia, unspecified: Secondary | ICD-10-CM | POA: Insufficient documentation

## 2022-10-18 DIAGNOSIS — Z7982 Long term (current) use of aspirin: Secondary | ICD-10-CM | POA: Insufficient documentation

## 2022-10-18 DIAGNOSIS — N189 Chronic kidney disease, unspecified: Secondary | ICD-10-CM | POA: Insufficient documentation

## 2022-10-18 DIAGNOSIS — Z79899 Other long term (current) drug therapy: Secondary | ICD-10-CM | POA: Diagnosis not present

## 2022-10-18 LAB — TROPONIN I (HIGH SENSITIVITY)
Troponin I (High Sensitivity): 14 ng/L (ref <18)
Troponin I (High Sensitivity): 18 ng/L — ABNORMAL HIGH (ref <18)

## 2022-10-18 LAB — BASIC METABOLIC PANEL
Anion gap: 9 (ref 5–15)
BUN: 18 mg/dL (ref 8–23)
CO2: 21 mmol/L — ABNORMAL LOW (ref 22–32)
Calcium: 8.6 mg/dL — ABNORMAL LOW (ref 8.9–10.3)
Chloride: 109 mmol/L (ref 98–111)
Creatinine, Ser: 1.86 mg/dL — ABNORMAL HIGH (ref 0.61–1.24)
GFR, Estimated: 37 mL/min — ABNORMAL LOW (ref >60)
Glucose, Bld: 177 mg/dL — ABNORMAL HIGH (ref 70–99)
Potassium: 3.4 mmol/L — ABNORMAL LOW (ref 3.5–5.1)
Sodium: 139 mmol/L (ref 135–145)

## 2022-10-18 LAB — CBC
HCT: 37.4 % — ABNORMAL LOW (ref 39.0–52.0)
Hemoglobin: 12.1 g/dL — ABNORMAL LOW (ref 13.0–17.0)
MCH: 27.5 pg (ref 26.0–34.0)
MCHC: 32.4 g/dL (ref 30.0–36.0)
MCV: 85 fL (ref 80.0–100.0)
Platelets: 185 10*3/uL (ref 150–400)
RBC: 4.4 MIL/uL (ref 4.22–5.81)
RDW: 13.9 % (ref 11.5–15.5)
WBC: 5.9 10*3/uL (ref 4.0–10.5)
nRBC: 0 % (ref 0.0–0.2)

## 2022-10-18 NOTE — ED Notes (Signed)
Pt was stuck twice. Wasn't successful for his 2nd trop.

## 2022-10-18 NOTE — ED Triage Notes (Signed)
Pt reports centralized chest pain since last week, radiates to his back. Denies SOB. Hx of anemia years ago.Was recently taken off iron supplements

## 2022-10-18 NOTE — ED Provider Notes (Cosign Needed Addendum)
[facility] EM[facility] DE[facility] AT [facility] Provider Note   CSN: [id number] Arrival date & time: 10/18/22  2952     History  Chief Complaint  Patient presents with   Chest Pain    James Jones is a 77 y.o. male with PMHx CKD, CHF, DM, GERD, HLD, HTN who presents to ED concerned for intermittent chest pain x3 weeks. Symptoms initially started after exercise and patient believed it to be muscle strain. Patient concerned because pain has not resolved. Has not tried any medications for pain. Pain is not associated with exertion vs rest vs food intake.  Denies fever, dyspnea, cough, nausea, vomiting, diarrhea. Denies recent surgery/immobilization, hx DT/PE, hemoptysis, hx cancer in the past 6 months, calf swelling/tenderness.     Chest Pain      Home Medications Prior to Admission medications   Medication Sig Start Date End Date Taking? Authorizing Provider  albuterol (VENTOLIN HFA) 108 (90 Base) MCG/ACT inhaler Inhale 2 puffs into the lungs every 6 (six) hours as needed for wheezing or shortness of breath. 08/27/21   Omar Person, MD  allopurinol (ZYLOPRIM) 100 MG tablet Take 1 tablet (100 mg total) by mouth daily. 10/14/22   Donita Brooks, MD  amLODipine (NORVASC) 10 MG tablet Take 1 tablet (10 mg total) by mouth daily. 02/23/22   Donita Brooks, MD  aspirin EC 81 MG tablet Take 1 tablet (81 mg total) by mouth daily. Swallow whole. 10/16/21   Alver Sorrow, NP  benzonatate (TESSALON) 200 MG capsule Take 1 capsule (200 mg total) by mouth 3 (three) times daily as needed for cough. Patient not taking: Reported on 08/24/2022 08/16/22   Rodriguez-Southworth, Nettie Elm, PA-C  Blood Glucose Monitoring Suppl (FREESTYLE LITE) DEVI Use to check blood sugar once daily 11/18/17   Carlus Pavlov, MD  carvedilol (COREG) 25 MG tablet Take 1 tablet (25 mg total) by mouth 2 (two) times daily. 08/31/22   Donita Brooks, MD  Cholecalciferol (VITAMIN D-3) 1000 UNITS CAPS Take by  mouth daily. 2 capsules.    [provider]  dapagliflozin propanediol (FARXIGA) 10 MG TABS tablet Take 1 tablet (10 mg total) by mouth daily before breakfast. 08/24/22   Carlus Pavlov, MD  doxazosin (CARDURA) 8 MG tablet Take 1 tablet (8 mg total) by mouth 2 (two) times daily. 11/06/21   Alver Sorrow, NP  famotidine (PEPCID) 20 MG tablet Take 1 tablet (20 mg total) by mouth at bedtime. 05/10/22   Alver Sorrow, NP  ferrous sulfate 325 (65 FE) MG tablet Take 1 tablet (325 mg total) by mouth 2 (two) times daily with a meal. 06/22/19   Donita Brooks, MD  fish oil-omega-3 fatty acids 1000 MG capsule Take 1,200 mg by mouth daily.     [provider]  glucose blood (FREESTYLE LITE) test strip USE TO CHECK BLOOD SUGAR 1-2X DAILY. E11.59 12/16/21   Carlus Pavlov, MD  Multiple Vitamin (MULTIVITAMIN WITH MINERALS) TABS Take 1 tablet by mouth daily.    [provider]  pantoprazole (PROTONIX) 40 MG tablet Take 1 tablet (40 mg total) by mouth daily. 05/10/22   Alver Sorrow, NP  PRESCRIPTION MEDICATION Uses C-PAP at bedtime    [provider]  rosuvastatin (CRESTOR) 40 MG tablet TAKE 1 TABLET BY MOUTH EVERY DAY 09/20/22   Donita Brooks, MD  sodium bicarbonate 650 MG tablet Take 650 mg by mouth daily. 04/23/22   [provider]  umeclidinium-vilanterol (ANORO ELLIPTA) 62.5-25 MCG/ACT  AEPB Inhale 1 puff into the lungs daily. 08/27/21   Omar Person, MD  valsartan (DIOVAN) 320 MG tablet TAKE 1 TABLET BY MOUTH EVERY DAY 08/16/22   Donita Brooks, MD      Allergies    Atorvastatin    Review of Systems   Review of Systems  Cardiovascular:  Positive for chest pain.    Physical Exam Updated Vital Signs BP (!) 144/69   Pulse (!) 58   Temp 97.9 F (36.6 C) (Oral)   Resp 20   Ht 5\' 8"  (1.727 m)   Wt 88.9 kg   SpO2 100%   BMI 29.80 kg/m  Physical Exam Vitals and nursing note reviewed.  Constitutional:      General: He is not in  acute distress.    Appearance: He is not ill-appearing, toxic-appearing or diaphoretic.  HENT:     Head: Normocephalic and atraumatic.     Mouth/Throat:     Mouth: Mucous membranes are moist.     Pharynx: No oropharyngeal exudate or posterior oropharyngeal erythema.  Eyes:     General: No scleral icterus.       Right eye: No discharge.        Left eye: No discharge.     Conjunctiva/sclera: Conjunctivae normal.  Cardiovascular:     Rate and Rhythm: Normal rate and regular rhythm.     Pulses: Normal pulses.          Radial pulses are 2+ on the right side and 2+ on the left side.       Dorsalis pedis pulses are 2+ on the right side and 2+ on the left side.     Heart sounds: Normal heart sounds. No murmur heard. Pulmonary:     Effort: Pulmonary effort is normal. No respiratory distress.     Breath sounds: Normal breath sounds. No wheezing, rhonchi or rales.  Abdominal:     General: Bowel sounds are normal.     Palpations: Abdomen is soft.     Tenderness: There is no abdominal tenderness.  Musculoskeletal:     Right lower leg: No edema.     Left lower leg: No edema.     Comments: No tenderness to palpation of calves BL. Tenderness to palpation of right upper latissimus dorsi.  Skin:    General: Skin is warm and dry.     Findings: No rash.  Neurological:     General: No focal deficit present.     Mental Status: He is alert. Mental status is at baseline.  Psychiatric:        Mood and Affect: Mood normal.        Behavior: Behavior normal.     ED Results / Procedures / Treatments   Labs (all labs ordered are listed, but only abnormal results are displayed) Labs Reviewed  BASIC METABOLIC PANEL - Abnormal; Notable for the following components:      Result Value   Potassium 3.4 (*)    CO2 21 (*)    Glucose, Bld 177 (*)    Creatinine, Ser 1.86 (*)    Calcium 8.6 (*)    GFR, Estimated 37 (*)    All other components within normal limits  CBC - Abnormal; Notable for the  following components:   Hemoglobin 12.1 (*)    HCT 37.4 (*)    All other components within normal limits  TROPONIN I (HIGH SENSITIVITY)  TROPONIN I (HIGH SENSITIVITY)    EKG EKG Interpretation Date/Time:  Monday  October 18 2022 09:18:34 EDT Ventricular Rate:  54 PR Interval:  166 QRS Duration:  140 QT Interval:  484 QTC Calculation: 458 R Axis:   101  Text Interpretation: Sinus bradycardia Right bundle branch block Abnormal ECG Confirmed by Ernie Avena ([id number]) on 10/18/2022 11:04:02 AM  Radiology DG Chest 2 View  Result Date: 10/18/2022 CLINICAL DATA:  Chest pain EXAM: CHEST - 2 VIEW COMPARISON:  08/16/2022 FINDINGS: Cardiac and mediastinal contours are within normal limits. The basilar atelectasis and/or scarring. No focal pulmonary opacity. No pleural effusion or pneumothorax. No acute osseous abnormality. IMPRESSION: No acute cardiopulmonary process. Electronically Signed   By: Wiliam Ke M.D.   On: 10/18/2022 11:07    Procedures Procedures    Medications Ordered in ED Medications - No data to display  ED Course/ Medical Decision Making/ A&P                                 Medical Decision Making Amount and/or Complexity of Data Reviewed Labs: ordered. Radiology: ordered.   This patient presents to the ED for concern of chest pain, this involves an extensive number of treatment options, and is a complaint that carries with it a high risk of complications and morbidity.  The differential diagnosis includes acute coronary syndrome, congestive heart failure, pericarditis, pneumonia, pulmonary embolism, tension pneumothorax, esophageal rupture, aortic dissection, cardiac tamponade, musculoskeletal   Co morbidities that complicate the patient evaluation  CKD, CHF, DM, GERD, HLD, HTN   Lab Tests:  I Ordered, and personally interpreted labs.  The pertinent results include:  - Troponin: 14 - BMP: no concern for electrolyte abnormality; no concern for kidney  damage - CBC: mild anemia; no leukocytosis   Imaging Studies ordered:  I ordered imaging studies including  -chest xray: to assess for process contributing to patient's symptoms I independently visualized and interpreted imaging I agree with the radiologist interpretation   Cardiac Monitoring: / EKG:  The patient was maintained on a cardiac monitor.  I personally viewed and interpreted the cardiac monitored which showed an underlying rhythm of: no changes from past tracing    Problem List / ED Course / Critical interventions / Medication management  Patient presented for intermittent chest pain x3 weeks. Chest pain is not associated with exertion vs rest vs food intake. Patient has not been taking pain medication. Physical exam unremarkable. Patient afebrile with stable vitals.  BMP with mild hypokalemia 3.4. Cr elevated but baseline for patient.  CBC with mild anemia.  No leukocytosis.  Initial troponin 14.  EKG without changes from past tracing.  Chest x-ray without acute cardiopulmonary disease. Overall, seems that patient's pain is MSK related.  However, we still need his repeat troponin given his history of heart conditions and recent episode of chest pain. Nursing having troubles obtaining patient's troponin.  Patient later telling me that he has been having intermittent RUQ abdominal pain. Patient still afebrile with stable vitals. No nausea or vomiting in ED. Offered patient RUQ [place] but patient declined stating that he has [facility] and he would prefer to follow up with his PCP. Also educated patient that he will need to follow up with cardiologist - patient endorsed understanding of plan. I have reviewed the patients home medicines and have made adjustments as needed   4:45 PM Care of [name] transferred to [facility] and Dr. Posey Rea at the end of my shift as the patient  will require reassessment once labs/imaging have resulted. Patient presentation, ED course, and plan  of care discussed with review of all pertinent labs and imaging. Please see his/her note for further details regarding further ED course and disposition. Plan at time of handoff is reassess patient after delta troponin results. If negative, patient should be appropriate for outpatient follow up with PCP and cardiologist.  This may be altered or completely changed at the discretion of the oncoming team pending results of further workup.   Ddx:  These are considered less likely due to history of present illness and physical exam findings.  -Acute coronary syndrome: EKG and troponin reassuring -Congestive heart failure: patient denies orthopnea, cough, and leg edema -Pericarditis: pain is not positional and patient denies orthopnea and recent illness -Pneumonia: lungs are clear to auscultation bilaterally -Pulmonary embolism: no recent surgeries, blood clot hx, hemoptysis, cancer hx, vitals stable -Pneumothorax: lungs are clear to auscultation bilaterally -Esophageal rupture: patient denies vomiting, heavy drinking, and hx of GERD -Aortic dissection: vital signs are stable, no variation in pulse pressure -Cardiac tamponade: absence of hypotension, JVD, and muffled heart sounds    Social Determinants of Health:  none          Final Clinical Impression(s) / ED Diagnoses Final diagnoses:  None    Rx / DC Orders ED Discharge Orders     None         Dorthy Cooler, [place] 10/18/22 1646    Valrie Hart F, PA-C 10/18/22 1708    Ernie Avena, MD 10/20/22 1122

## 2022-10-18 NOTE — ED Notes (Signed)
Patient approached staff and stated, "I've had enough, I'm leaving." Pt encouraged to stay, but he was adamant on leaving.

## 2022-10-18 NOTE — ED Provider Notes (Signed)
  Physical Exam  BP (!) 142/74   Pulse 60   Temp 98.2 F (36.8 C) (Oral)   Resp 16   Ht 5\' 8"  (1.727 m)   Wt 88.9 kg   SpO2 95%   BMI 29.80 kg/m   Physical Exam Vitals and nursing note reviewed.  Constitutional:      General: He is not in acute distress.    Appearance: He is well-developed.  HENT:     Head: Normocephalic and atraumatic.  Eyes:     Conjunctiva/sclera: Conjunctivae normal.  Cardiovascular:     Rate and Rhythm: Normal rate and regular rhythm.     Heart sounds: No murmur heard. Pulmonary:     Effort: Pulmonary effort is normal. No respiratory distress.     Breath sounds: Normal breath sounds.  Abdominal:     Palpations: Abdomen is soft.     Tenderness: There is no abdominal tenderness.  Musculoskeletal:        General: No swelling.     Cervical back: Neck supple.     Comments: No tenderness to palpation along the rib cage diffusely  Skin:    General: Skin is warm and dry.     Capillary Refill: Capillary refill takes less than 2 seconds.     Comments: No rashes  Neurological:     Mental Status: He is alert.  Psychiatric:        Mood and Affect: Mood normal.     Procedures  Procedures  ED Course / MDM    Medical Decision Making Amount and/or Complexity of Data Reviewed Labs: ordered. Radiology: ordered.   Patient received as handoff from [facility], PA-C.  Patient presenting for concern for 3 weeks of intermittent substernal chest tightness that radiates into pain along his right rib cage.  These episodes last for about 10 minutes at a time and then subside.  Sometimes he goes multiple days without having an episode.  Does not seem associated with exertion or eating. Currently in no pain.  Patient is well-appearing with normal vital signs.  His EKG is without any signs of ischemia and initial troponin of 14.  I was unable to reassess patient and update him on these results as he eloped from the facility.  I was not able to inform him about  the risks of leaving.       Arabella Merles, PA-C 10/18/22 1817    Glendora Score, MD 10/19/22 [phone]

## 2022-10-18 NOTE — ED Notes (Signed)
Provider inquired about second troponin, phlebotomy contacted to obtain.

## 2022-10-19 ENCOUNTER — Emergency Department (HOSPITAL_BASED_OUTPATIENT_CLINIC_OR_DEPARTMENT_OTHER): Payer: Medicare Other

## 2022-10-19 ENCOUNTER — Other Ambulatory Visit (HOSPITAL_BASED_OUTPATIENT_CLINIC_OR_DEPARTMENT_OTHER): Payer: Self-pay

## 2022-10-19 ENCOUNTER — Other Ambulatory Visit: Payer: Self-pay

## 2022-10-19 ENCOUNTER — Ambulatory Visit (INDEPENDENT_AMBULATORY_CARE_PROVIDER_SITE_OTHER): Payer: Medicare Other | Admitting: Family Medicine

## 2022-10-19 ENCOUNTER — Encounter (HOSPITAL_BASED_OUTPATIENT_CLINIC_OR_DEPARTMENT_OTHER): Payer: Self-pay | Admitting: Emergency Medicine

## 2022-10-19 ENCOUNTER — Emergency Department (HOSPITAL_BASED_OUTPATIENT_CLINIC_OR_DEPARTMENT_OTHER)
Admission: EM | Admit: 2022-10-19 | Discharge: 2022-10-19 | Disposition: A | Discharge status: Home / Self Care | Payer: Medicare Other | Attending: Emergency Medicine | Admitting: Emergency Medicine

## 2022-10-19 VITALS — BP 120/60 | HR 57 | Temp 98.3°F | Ht 68.0 in | Wt 197.2 lb

## 2022-10-19 DIAGNOSIS — I130 Hypertensive heart and chronic kidney disease with heart failure and stage 1 through stage 4 chronic kidney disease, or unspecified chronic kidney disease: Secondary | ICD-10-CM | POA: Diagnosis not present

## 2022-10-19 DIAGNOSIS — N189 Chronic kidney disease, unspecified: Secondary | ICD-10-CM | POA: Insufficient documentation

## 2022-10-19 DIAGNOSIS — N400 Enlarged prostate without lower urinary tract symptoms: Secondary | ICD-10-CM | POA: Diagnosis not present

## 2022-10-19 DIAGNOSIS — Z7982 Long term (current) use of aspirin: Secondary | ICD-10-CM | POA: Diagnosis not present

## 2022-10-19 DIAGNOSIS — Z79899 Other long term (current) drug therapy: Secondary | ICD-10-CM | POA: Diagnosis not present

## 2022-10-19 DIAGNOSIS — K5730 Diverticulosis of large intestine without perforation or abscess without bleeding: Secondary | ICD-10-CM | POA: Diagnosis not present

## 2022-10-19 DIAGNOSIS — N281 Cyst of kidney, acquired: Secondary | ICD-10-CM | POA: Diagnosis not present

## 2022-10-19 DIAGNOSIS — R079 Chest pain, unspecified: Secondary | ICD-10-CM | POA: Insufficient documentation

## 2022-10-19 DIAGNOSIS — R0789 Other chest pain: Secondary | ICD-10-CM | POA: Diagnosis not present

## 2022-10-19 DIAGNOSIS — I509 Heart failure, unspecified: Secondary | ICD-10-CM | POA: Insufficient documentation

## 2022-10-19 LAB — CBC
HCT: 38 % — ABNORMAL LOW (ref 39.0–52.0)
Hemoglobin: 12.7 g/dL — ABNORMAL LOW (ref 13.0–17.0)
MCH: 28.2 pg (ref 26.0–34.0)
MCHC: 33.4 g/dL (ref 30.0–36.0)
MCV: 84.3 fL (ref 80.0–100.0)
Platelets: 193 10*3/uL (ref 150–400)
RBC: 4.51 MIL/uL (ref 4.22–5.81)
RDW: 14.2 % (ref 11.5–15.5)
WBC: 6.5 10*3/uL (ref 4.0–10.5)
nRBC: 0 % (ref 0.0–0.2)

## 2022-10-19 LAB — TROPONIN I (HIGH SENSITIVITY)
Troponin I (High Sensitivity): 17 ng/L (ref <18)
Troponin I (High Sensitivity): 24 ng/L — ABNORMAL HIGH (ref <18)

## 2022-10-19 LAB — BASIC METABOLIC PANEL
Anion gap: 12 (ref 5–15)
BUN: 20 mg/dL (ref 8–23)
CO2: 22 mmol/L (ref 22–32)
Calcium: 8.7 mg/dL — ABNORMAL LOW (ref 8.9–10.3)
Chloride: 105 mmol/L (ref 98–111)
Creatinine, Ser: 2.11 mg/dL — ABNORMAL HIGH (ref 0.61–1.24)
GFR, Estimated: 31 mL/min — ABNORMAL LOW (ref >60)
Glucose, Bld: 113 mg/dL — ABNORMAL HIGH (ref 70–99)
Potassium: 3.6 mmol/L (ref 3.5–5.1)
Sodium: 139 mmol/L (ref 135–145)

## 2022-10-19 LAB — D-DIMER, QUANTITATIVE: D-Dimer, Quant: 0.72 ug{FEU}/mL — ABNORMAL HIGH (ref 0.00–0.50)

## 2022-10-19 MED ORDER — LACTATED RINGERS IV BOLUS
500.0000 mL | Freq: Once | INTRAVENOUS | Status: AC
  Administered 2022-10-19: 500 mL via INTRAVENOUS

## 2022-10-19 MED ORDER — ACETAMINOPHEN 500 MG PO TABS
1000.0000 mg | ORAL_TABLET | Freq: Once | ORAL | Status: AC
  Administered 2022-10-19: 1000 mg via ORAL
  Filled 2022-10-19: qty 2

## 2022-10-19 MED ORDER — IOHEXOL 350 MG/ML SOLN
100.0000 mL | Freq: Once | INTRAVENOUS | Status: AC | PRN
  Administered 2022-10-19: 70 mL via INTRAVENOUS

## 2022-10-19 NOTE — Progress Notes (Signed)
Subjective:    Patient ID: James Jones, male    DOB: 08-Feb-1946, 77 y.o.   MRN: [id number]  HPI  Patient complains of intermittent substernal chest pain for the last 3 weeks.  He reports the pain is a 2 on a scale of 1-10.  Is occurring right now.  He went to the emergency room yesterday.  Initial troponin was negative.  Second troponin was borderline AT 18.  EKG showed a right bundle branch block and sinus bradycardia but no evidence of STEMI.  Patient has known severe coronary artery calcifications on a CT scan of his chest earlier this year.  However he only started having chest pain 3 weeks agO.  He is concerned because of pain starting in to his right lower back.  He states the pain in his right lower back is a 5 on a scale of 1-10.  The pain radiates from his right lower back to his right chest.  Movement does not seem to exacerbate the pain.  In fact, prior to going to the emergency room yesterday, the patient had exercised at the gym and walked 5 miles with no exacerbation of his chest pain.  He denies any hematuria or dysuria.  He denies any pleurisy or shortness of breath.  Chest x-ray is clear.  Patient had a nuclear stress test last year that showed no ischemia.  Patient took 1 nitroglycerin today and the chest pain improved.  The pain in his back also improved.  Past Medical History:  Diagnosis Date   Aortic stenosis 04/02/2021   Chronic diastolic heart failure (HCC)    CKD (chronic kidney disease) stage 2, GFR 60-89 ml/min 03/17/2012   Coronary artery disease, non-occlusive 03/18/2012   60-70% stenosis in RCA -- FFR 0.84; EF 60-65%   Diabetes mellitus without complication (HCC)    GERD (gastroesophageal reflux disease)    On PPI   History of GI bleed 02/2013   No obvious findings on EGD/colonoscopy   Hydrocele, bilateral    large on [place] 2020   Hyperlipidemia    Hypertension    Obesity (BMI 30.0-34.9) 10/02/2012   OSA (obstructive sleep apnea) 03/18/2012   Doing better  with CPAP   Pulmonary hypertension (HCC) 12/10/2010   ECHO:  Mild PH,mild LVH; PA pressures estimated 30-40 mmHg   Pulmonary hypertension, unspecified (HCC) 06/04/2021   RBBB, 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   Event monitor; usually during sleeping hours; therefore not on beta blocker   Past Surgical History:  Procedure Laterality Date   APPENDECTOMY  1974   CARPAL TUNNEL RELEASE     EYE SURGERY  1960   Left eye   HEMORRHOID SURGERY     LEFT HEART CATH AND CORONARY ANGIOGRAPHY  03/27/2010   Moderate mid RCA lesion,right radial approach,normal EF   LEFT HEART CATHETERIZATION WITH CORONARY ANGIOGRAM N/A 03/17/2012   Procedure: LEFT HEART CATHETERIZATION WITH CORONARY ANGIOGRAM;  Surgeon: Marykay Lex, MD;  Location: [facility] C[facility] LAB;  Service: Cardiovascular: Fractional Flow Reserve Measurement of mid RCA 60-70% stenosis = 0.84. Otherwise mild LCA CAD.  Normal EF & EDP   SHOULDER ARTHROSCOPY  2006   TRANSTHORACIC ECHOCARDIOGRAM  12/2010    Mild concentric LVH.  EF> 55%.  GR 1 DD.  Mild aortic sclerosis no stenosis.  PA pressures estimated 30-40 mmHg   Current Outpatient Medications on File Prior to Visit  Medication Sig Dispense Refill   albuterol (VENTOLIN HFA) 108 (90 Base) MCG/ACT inhaler Inhale 2 puffs into the  lungs every 6 (six) hours as needed for wheezing or shortness of breath. 8 g 6   allopurinol (ZYLOPRIM) 100 MG tablet Take 1 tablet (100 mg total) by mouth daily. 60 tablet 1   amLODipine (NORVASC) 10 MG tablet Take 1 tablet (10 mg total) by mouth daily. 90 tablet 3   aspirin EC 81 MG tablet Take 1 tablet (81 mg total) by mouth daily. Swallow whole. 90 tablet 3   Blood Glucose Monitoring Suppl (FREESTYLE LITE) DEVI Use to check blood sugar once daily 1 each 0   carvedilol (COREG) 25 MG tablet Take 1 tablet (25 mg total) by mouth 2 (two) times daily. 180 tablet 1   Cholecalciferol (VITAMIN D-3) 1000 UNITS CAPS Take by mouth daily. 2 capsules.     dapagliflozin  propanediol (FARXIGA) 10 MG TABS tablet Take 1 tablet (10 mg total) by mouth daily before breakfast. 90 tablet 3   doxazosin (CARDURA) 8 MG tablet Take 1 tablet (8 mg total) by mouth 2 (two) times daily. 180 tablet 3   famotidine (PEPCID) 20 MG tablet Take 1 tablet (20 mg total) by mouth at bedtime. 30 tablet 11   ferrous sulfate 325 (65 FE) MG tablet Take 1 tablet (325 mg total) by mouth 2 (two) times daily with a meal. 180 tablet 3   fish oil-omega-3 fatty acids 1000 MG capsule Take 1,200 mg by mouth daily.      glucose blood (FREESTYLE LITE) test strip USE TO CHECK BLOOD SUGAR 1-2X DAILY. E11.59 200 strip 3   Multiple Vitamin (MULTIVITAMIN WITH MINERALS) TABS Take 1 tablet by mouth daily.     pantoprazole (PROTONIX) 40 MG tablet Take 1 tablet (40 mg total) by mouth daily. 30 tablet 11   PRESCRIPTION MEDICATION Uses C-PAP at bedtime     rosuvastatin (CRESTOR) 40 MG tablet TAKE 1 TABLET BY MOUTH EVERY DAY 90 tablet 1   sodium bicarbonate 650 MG tablet Take 650 mg by mouth daily.     umeclidinium-vilanterol (ANORO ELLIPTA) 62.5-25 MCG/ACT AEPB Inhale 1 puff into the lungs daily. 7 each 0   valsartan (DIOVAN) 320 MG tablet TAKE 1 TABLET BY MOUTH EVERY DAY 90 tablet 0   benzonatate (TESSALON) 200 MG capsule Take 1 capsule (200 mg total) by mouth 3 (three) times daily as needed for cough. (Patient not taking: Reported on 08/24/2022) 30 capsule 0   No current facility-administered medications on file prior to visit.   Allergies  Allergen Reactions   Atorvastatin     myalgias   Social History   Socioeconomic History   Marital status: Married    Spouse name: Not on file   Number of children: Not on file   Years of education: Not on file   Highest education level: Not on file  Occupational History   Not on file  Tobacco Use   Smoking status: Former    Current packs/day: 0.00    Average packs/day: 2.0 packs/day for 50.0 years (100.0 ttl pk-yrs)    Types: Cigarettes    Start date: 10/10/1970     Quit date: 10/09/2020    Years since quitting: 2.0   Smokeless tobacco: Never  Vaping Use   Vaping status: Never Used  Substance and Sexual Activity   Alcohol use: Yes    Alcohol/week: 1.0 standard drink of alcohol    Types: 1 Standard drinks or equivalent per week   Drug use: Yes    Types: Marijuana    Comment: cannibus, occ use   Sexual  activity: Not on file  Other Topics Concern   Not on file  Social History Narrative   Married, father of 2, grandfather of 3.   He is a former smoker of about a pack to pack and half cigarettes a day -- he quit in March of this year.    He is an avid exerciser working at least 4-5 days a week doing her walking or stationary bike.   Social Determinants of Health   Financial Resource Strain: Low Risk  (04/29/2022)   Overall Financial Resource Strain (CARDIA)    Difficulty of Paying Living Expenses: Not hard at all  Food Insecurity: No Food Insecurity (04/29/2022)   Hunger Vital Sign    Worried About Running Out of Food in the Last Year: Never true    Ran Out of Food in the Last Year: Never true  Transportation Needs: No Transportation Needs (04/29/2022)   PRAPARE - Administrator, Civil Service (Medical): No    Lack of Transportation (Non-Medical): No  Physical Activity: Sufficiently Active (04/29/2022)   Exercise Vital Sign    Days of Exercise per Week: 5 days    Minutes of Exercise per Session: 40 min  Stress: No Stress Concern Present (04/29/2022)   [employer] of Occupational Health - Occupational Stress Questionnaire    Feeling of Stress : Not at all  Social Connections: Socially Integrated (04/29/2022)   Social Connection and Isolation Panel [NHANES]    Frequency of Communication with Friends and Family: More than three times a week    Frequency of Social Gatherings with Friends and Family: More than three times a week    Attends Religious Services: More than 4 times per year    Active Member of [employer] or Organizations:  Yes    Attends Engineer, structural: More than 4 times per year    Marital Status: Married  Catering manager Violence: Not At Risk (04/29/2022)   Humiliation, Afraid, Rape, and Kick questionnaire    Fear of Current or Ex-Partner: No    Emotionally Abused: No    Physically Abused: No    Sexually Abused: No   Family History  Problem Relation Age of Onset   Hypertension Mother    Kidney failure Mother    Coronary artery disease Father    Hypertension Sister    Kidney failure Sister    Stroke Maternal Grandmother      Review of Systems  All other systems reviewed and are negative.      Objective:   Physical Exam Vitals reviewed.  Constitutional:      General: He is not in acute distress.    Appearance: He is well-developed. He is not diaphoretic.  HENT:     Head: Normocephalic and atraumatic.     Right Ear: External ear normal.     Left Ear: External ear normal.     Nose: Nose normal.     Mouth/Throat:     Pharynx: No oropharyngeal exudate.  Eyes:     General: No scleral icterus.       Right eye: No discharge.        Left eye: No discharge.     Conjunctiva/sclera: Conjunctivae normal.     Pupils: Pupils are equal, round, and reactive to light.  Neck:     Thyroid: No thyromegaly.     Vascular: No JVD.     Trachea: No tracheal deviation.  Cardiovascular:     Rate and Rhythm: Normal rate and regular  rhythm.     Heart sounds: Murmur heard.     No friction rub. No gallop.  Pulmonary:     Effort: Pulmonary effort is normal. No respiratory distress.     Breath sounds: Normal breath sounds. No stridor. No wheezing or rales.  Chest:     Chest wall: No tenderness.    Abdominal:     General: Bowel sounds are normal. There is no distension.     Palpations: Abdomen is soft. There is no mass.     Tenderness: There is no abdominal tenderness. There is no guarding or rebound.  Musculoskeletal:        General: No tenderness or deformity. Normal range of motion.      Cervical back: Normal range of motion and neck supple.       Back:  Lymphadenopathy:     Cervical: No cervical adenopathy.  Skin:    General: Skin is warm.     Coloration: Skin is not pale.     Findings: No erythema or rash.  Neurological:     Mental Status: He is alert and oriented to person, place, and time.     Cranial Nerves: No cranial nerve deficit.     Motor: No abnormal muscle tone.     Coordination: Coordination normal.     Deep Tendon Reflexes: Reflexes are normal and symmetric.  Psychiatric:        Behavior: Behavior normal.        Thought Content: Thought content normal.        Judgment: Judgment normal.           Assessment & Plan:  Atypical chest pain Chest pain is very atypical.  I do not feel that this is acute coronary syndrome.  However I am concerned about possible aortic dissection.  The pain is radiating into the patient's back lower right flank.   I feel he would benefit from a CT scan to rule out dissection and I am unable to get this today urgently.  Therefore I recommended that he return to the emergency room for possible CT scan.  Patient will be driven to the emergency room by his wife.  We will notify them of his pending arrival

## 2022-10-19 NOTE — ED Provider Notes (Signed)
[facility] AT [facility] Provider Note   CSN: [id number] Arrival date & time: 10/19/22  1115     History  Chief Complaint  Patient presents with   Chest Pain    James Jones is a 77 y.o. male.  Pt is a 76y/o male with PMHx CKD, CHF, DM, GERD, HLD, HTN who presents today with ongoing pain in his chest.  The pain has been present for the last 3 weeks and started after he was at the gym 1 day.  He reports that the pain across the center of his chest has been more constant but intermittently he will get a pain like a knife is stabbing him in his right side.  It does not seem to be affected by exercise or movements.  It is not tender to the touch.  He has not had shortness of breath, nausea, vomiting, fever, cough.  He was seen at the emergency room yesterday and at that time had troponins that were benign, normal chest x-ray and unchanged labs in general.  He followed up with his doctor today and his doctor was concern for possible aortic dissection.  He is sent him here for further imaging.  Patient did take 1 nitroglycerin in the office and patient reports initially he thought it might of helped but reports the pain seems similar now as it was earlier.  He denies any history of blood clots and has been active.  Yesterday he reports he walks 6 miles at the gym and did not notice any change in his symptoms.  He denies any syncope.  The history is provided by the patient and medical records.  Chest Pain      Home Medications Prior to Admission medications   Medication Sig Start Date End Date Taking? Authorizing Provider  allopurinol (ZYLOPRIM) 100 MG tablet Take 1 tablet (100 mg total) by mouth daily. 10/14/22  Yes Donita Brooks, MD  amLODipine (NORVASC) 10 MG tablet Take 1 tablet (10 mg total) by mouth daily. 02/23/22  Yes Donita Brooks, MD  aspirin EC 81 MG tablet Take 1 tablet (81 mg total) by mouth daily. Swallow whole. 10/16/21  Yes Alver Sorrow,  NP  carvedilol (COREG) 25 MG tablet Take 1 tablet (25 mg total) by mouth 2 (two) times daily. 08/31/22  Yes Donita Brooks, MD  Cholecalciferol (VITAMIN D-3) 1000 UNITS CAPS Take by mouth daily. 2 capsules.   Yes [provider]  dapagliflozin propanediol (FARXIGA) 10 MG TABS tablet Take 1 tablet (10 mg total) by mouth daily before breakfast. 08/24/22  Yes Carlus Pavlov, MD  doxazosin (CARDURA) 8 MG tablet Take 1 tablet (8 mg total) by mouth 2 (two) times daily. 11/06/21  Yes Alver Sorrow, NP  famotidine (PEPCID) 20 MG tablet Take 1 tablet (20 mg total) by mouth at bedtime. 05/10/22  Yes Alver Sorrow, NP  ferrous sulfate 325 (65 FE) MG tablet Take 1 tablet (325 mg total) by mouth 2 (two) times daily with a meal. 06/22/19  Yes Donita Brooks, MD  fish oil-omega-3 fatty acids 1000 MG capsule Take 1,200 mg by mouth daily.    Yes [provider]  Multiple Vitamin (MULTIVITAMIN WITH MINERALS) TABS Take 1 tablet by mouth daily.   Yes [provider]  pantoprazole (PROTONIX) 40 MG tablet Take 1 tablet (40 mg total) by mouth daily. 05/10/22  Yes Alver Sorrow, NP  PRESCRIPTION MEDICATION Uses C-PAP at bedtime   Yes [provider]  rosuvastatin (CRESTOR) 40 MG tablet TAKE 1 TABLET BY MOUTH EVERY DAY 09/20/22  Yes Donita Brooks, MD  sodium bicarbonate 650 MG tablet Take 650 mg by mouth daily. 04/23/22  Yes [provider]  valsartan (DIOVAN) 320 MG tablet TAKE 1 TABLET BY MOUTH EVERY DAY 08/16/22  Yes Donita Brooks, MD  albuterol (VENTOLIN HFA) 108 (90 Base) MCG/ACT inhaler Inhale 2 puffs into the lungs every 6 (six) hours as needed for wheezing or shortness of breath. 08/27/21   Omar Person, MD  benzonatate (TESSALON) 200 MG capsule Take 1 capsule (200 mg total) by mouth 3 (three) times daily as needed for cough. Patient not taking: Reported on 08/24/2022 08/16/22   Rodriguez-Southworth, Nettie Elm, PA-C  Blood Glucose Monitoring Suppl  (FREESTYLE LITE) DEVI Use to check blood sugar once daily 11/18/17   Carlus Pavlov, MD  glucose blood (FREESTYLE LITE) test strip USE TO CHECK BLOOD SUGAR 1-2X DAILY. E11.59 12/16/21   Carlus Pavlov, MD  umeclidinium-vilanterol (ANORO ELLIPTA) 62.5-25 MCG/ACT AEPB Inhale 1 puff into the lungs daily. 08/27/21   Omar Person, MD      Allergies    Atorvastatin    Review of Systems   Review of Systems  Cardiovascular:  Positive for chest pain.    Physical Exam Updated Vital Signs BP (!) 147/71   Pulse 60   Temp 97.8 F (36.6 C) (Oral)   Resp 18   SpO2 97%  Physical Exam Vitals and nursing note reviewed.  Constitutional:      General: He is not in acute distress.    Appearance: He is well-developed.  HENT:     Head: Normocephalic and atraumatic.  Eyes:     Conjunctiva/sclera: Conjunctivae normal.     Pupils: Pupils are equal, round, and reactive to light.  Cardiovascular:     Rate and Rhythm: Regular rhythm. Bradycardia present.     Heart sounds: Murmur heard.  Pulmonary:     Effort: Pulmonary effort is normal. No respiratory distress.     Breath sounds: Normal breath sounds. No wheezing or rales.  Chest:     Chest wall: No tenderness.  Abdominal:     General: There is no distension.     Palpations: Abdomen is soft.     Tenderness: There is no abdominal tenderness. There is no guarding or rebound.  Musculoskeletal:        General: No tenderness. Normal range of motion.     Cervical back: Normal range of motion and neck supple.     Right lower leg: No edema.     Left lower leg: No edema.  Skin:    General: Skin is warm and dry.     Findings: No erythema or rash.  Neurological:     Mental Status: He is alert and oriented to person, place, and time.     Sensory: No sensory deficit.     Motor: No weakness.     Gait: Gait normal.  Psychiatric:        Behavior: Behavior normal.     ED Results / Procedures / Treatments   Labs (all labs ordered are  listed, but only abnormal results are displayed) Labs Reviewed  BASIC METABOLIC PANEL - Abnormal; Notable for the following components:      Result Value   Glucose, Bld 113 (*)    Creatinine, Ser 2.11 (*)    Calcium 8.7 (*)    GFR, Estimated 31 (*)    All other components  within normal limits  CBC - Abnormal; Notable for the following components:   Hemoglobin 12.7 (*)    HCT 38.0 (*)    All other components within normal limits  D-DIMER, QUANTITATIVE - Abnormal; Notable for the following components:   D-Dimer, Quant 0.72 (*)    All other components within normal limits  TROPONIN I (HIGH SENSITIVITY)  TROPONIN I (HIGH SENSITIVITY)    EKG EKG Interpretation Date/Time:  Tuesday October 19 2022 11:23:36 EDT Ventricular Rate:  58 PR Interval:  160 QRS Duration:  144 QT Interval:  478 QTC Calculation: 469 R Axis:   -7  Text Interpretation: Sinus bradycardia with Premature atrial complexes Right bundle branch block No significant change since last tracing When compared with ECG of 18-Oct-2022 09:18, Premature atrial complexes are now Present Confirmed by Gwyneth Sprout ([id number]) on 10/19/2022 11:37:13 AM  Radiology CT Angio Chest/Abd/Pel for Dissection W and/or Wo Contrast  Result Date: 10/19/2022 CLINICAL DATA:  Three-week history of central chest pain radiating to the lower back EXAM: CT ANGIOGRAPHY CHEST, ABDOMEN AND PELVIS TECHNIQUE: Non-contrast CT of the chest was initially obtained. Multidetector CT imaging through the chest, abdomen and pelvis was performed using the standard protocol during bolus administration of intravenous contrast. Multiplanar reconstructed images and MIPs were obtained and reviewed to evaluate the vascular anatomy. RADIATION DOSE REDUCTION: This exam was performed according to the departmental dose-optimization program which includes automated exposure control, adjustment of the mA and/or kV according to patient size and/or use of iterative reconstruction  technique. CONTRAST:  70mL OMNIPAQUE IOHEXOL 350 MG/ML SOLN COMPARISON:  CT abdomen and pelvis dated 10/05/2017, CT chest dated 09/02/2022 FINDINGS: CTA CHEST FINDINGS Cardiovascular: Preferential opacification of the thoracic aorta. No evidence of thoracic aortic aneurysm or dissection. Normal heart size. No pericardial effusion. No central pulmonary emboli. Coronary artery calcifications. Mediastinum/Nodes: Imaged thyroid gland without nodules meeting criteria for imaging follow-up by size. Normal esophagus. No pathologically enlarged axillary, supraclavicular, mediastinal, or hilar lymph nodes. Lungs/Pleura: The central airways are patent. Endoluminal filling defect within the left upper lobe apical segment measures 5 x 4 mm (7:56). Mild centrilobular emphysema. No pneumothorax. No pleural effusion. Musculoskeletal: No acute or abnormal lytic or blastic osseous lesions. Multilevel degenerative changes of the thoracic spine. Review of the MIP images confirms the above findings. CTA ABDOMEN AND PELVIS FINDINGS VASCULAR Aorta: Scattered segmental aortic atherosclerosis. Normal caliber aorta without aneurysm, dissection, vasculitis or significant stenosis. Celiac: Patent without evidence of aneurysm, dissection, vasculitis or significant stenosis. Left gastric artery arises directly from the aorta. The right hepatic artery arises from the celiac artery. SMA: Patent without evidence of aneurysm, dissection, vasculitis or significant stenosis. Renals: Both renal arteries are patent without evidence of aneurysm, dissection, vasculitis, fibromuscular dysplasia or significant stenosis. IMA: Patent without evidence of aneurysm, dissection, vasculitis or significant stenosis. Inflow: Patent without evidence of aneurysm, dissection, vasculitis or significant stenosis. Proximal Outflow: Bilateral common femoral and visualized portions of the superficial and profunda femoral arteries are patent without evidence of aneurysm,  dissection, vasculitis or significant stenosis. Veins: No obvious venous abnormality within the limitations of this arterial phase study. Review of the MIP images confirms the above findings. NON-VASCULAR Hepatobiliary: Ill-defined subsegmental focus of arterial enhancement in segment 2 (5:141), likely flash filling hemangioma or perfusional variation. No intra or extrahepatic biliary ductal dilation. Normal gallbladder. Pancreas: No focal lesions or main ductal dilation. Spleen: Normal in size without focal abnormality. Adrenals/Urinary Tract: No adrenal nodules. 2.5 cm heterogeneous focus in the upper pole left kidney (  5:158) is incompletely characterized. Multifocal bilateral simple-appearing renal cysts. No hydronephrosis. No focal bladder wall thickening. Stomach/Bowel: Normal appearance of the stomach. Small diverticulum arising from the third/fourth portion of the duodenal. No evidence of bowel wall thickening, distention, or inflammatory changes. Colonic diverticulosis without acute diverticulitis. Appendectomy. Lymphatic: No enlarged abdominal or pelvic lymph nodes. Reproductive: Enlarged prostate. Other: No free fluid, fluid collection, or free air. Musculoskeletal: No acute or abnormal lytic or blastic osseous lesions. Multilevel degenerative changes of the lumbar spine. Review of the MIP images confirms the above findings. IMPRESSION: 1. No evidence of aortic aneurysm or dissection. 2. Endoluminal filling defect within the left upper lobe apical segment bronchus measures 5 x 4 mm, which may represent mucous plugging or a small neoplasm. Additional imaging evaluation or consultation with Pulmonology or Thoracic Surgery recommended. 3. A 2.5 cm heterogeneous focus in the upper pole left kidney is incompletely characterized. Recommend further evaluation with nonemergent renal protocol MRI or CT. Additional bilateral renal cysts can be evaluated at the same time. 4. Aortic Atherosclerosis (ICD10-I70.0) and  Emphysema (ICD10-J43.9). Coronary artery calcifications. Assessment for potential risk factor modification, dietary therapy or pharmacologic therapy may be warranted, if clinically indicated. Electronically Signed   By: Agustin Cree M.D.   On: 10/19/2022 14:58   DG Chest 2 View  Result Date: 10/18/2022 CLINICAL DATA:  Chest pain EXAM: CHEST - 2 VIEW COMPARISON:  08/16/2022 FINDINGS: Cardiac and mediastinal contours are within normal limits. The basilar atelectasis and/or scarring. No focal pulmonary opacity. No pleural effusion or pneumothorax. No acute osseous abnormality. IMPRESSION: No acute cardiopulmonary process. Electronically Signed   By: Wiliam Ke M.D.   On: 10/18/2022 11:07    Procedures Procedures    Medications Ordered in ED Medications  lactated ringers bolus 500 mL (500 mLs Intravenous New Bag/Given 10/19/22 1247)  iohexol (OMNIPAQUE) 350 MG/ML injection 100 mL (70 mLs Intravenous Contrast Given 10/19/22 1234)  acetaminophen (TYLENOL) tablet 1,000 mg (1,000 mg Oral Given 10/19/22 1539)    ED Course/ Medical Decision Making/ A&P                                 Medical Decision Making Amount and/or Complexity of Data Reviewed External Data Reviewed: notes. Labs: ordered. Decision-making details documented in ED Course. Radiology: ordered and independent interpretation performed. Decision-making details documented in ED Course. ECG/medicine tests: ordered and independent interpretation performed. Decision-making details documented in ED Course.  Risk OTC drugs. Prescription drug management.   Pt with multiple medical problems and comorbidities and presenting today with a complaint that caries a high risk for morbidity and mortality.  Here today for complaint of chest pain.  This pain has been present for 3 weeks.  Has not been associated with deep breathing or certain movements.  He denies any infectious symptoms and low suspicion for pneumonia, pneumothorax.  Patient was  seen yesterday in the emergency room and troponins were normal and low suspicion that this is ACS.  I independently interpreted patient's EKG and it shows a persistent right bundle branch block which is unchanged.  Patient was seen by PCP today who was concern for possible dissection.  Patient is having intermittent sharp stabbing pains in his right side.  Low suspicion for epidural abscess or hematoma.  Patient's strength is normal.  Symptoms do not seem to be associated with eating or GI pathology.  Patient was sent by PCP to have a CT done.  3:42 PM I independently interpreted patient's labs and BMP with unchanged renal function with creatinine of 2.1 and a GFR of 31, CBC without acute changes and troponin is still 17 which is similar to yesterday.  D-dimer is normal for age.  I have independently visualized and interpreted pt's images today. CTA today without evidence of dissection.  No evidence of PE.  Radiology does report endoluminal filling defect in the left upper lobe which could be mucous plugging or a small neoplasm and recommended outpatient follow-up.  Also patient has a small heterogeneous focus in the left kidney.  These can be followed up as an outpatient with his PCP.  They are not the cause of his pain today.  Suspect this is musculoskeletal.  He has not tried taking anything for the pain and recommended Tylenol.  Still to avoid NSAIDs with his chronic kidney disease and cannot do steroids as he is a diabetic.  Recommend following up with Dr. Tanya Nones.  Patient is wife are comfortable with this plan.  No indication for admission or further testing currently.        Final Clinical Impression(s) / ED Diagnoses Final diagnoses:  Nonspecific chest pain    Rx / DC Orders ED Discharge Orders     None         Gwyneth Sprout, MD 10/19/22 1542

## 2022-10-19 NOTE — Discharge Instructions (Addendum)
The CAT scan did not show any problems with your aorta.  It did show a small spot in the lung that needs follow-up and your doctor should be able to arrange that.  Try taking extra strength Tylenol as needed for the pain.  Also heating pad or the sauna.  Continue to avoid upper arm and chest workouts.

## 2022-10-19 NOTE — ED Triage Notes (Signed)
Central Chest pain started 3 weeks ago per patient.radiates to lower back. No sobv, non n/v Reports feeling more fatigue

## 2022-10-19 NOTE — Progress Notes (Signed)
Patient given nitroglycerin 0.4 mg sublingual per Dr Tanya Nones for chest pain.

## 2022-10-27 ENCOUNTER — Telehealth: Payer: Self-pay

## 2022-10-27 NOTE — Transitions of Care (Post Inpatient/ED Visit) (Addendum)
10/27/2022  Name: James Jones MRN: [id number] DOB: 03/23/1945  Today's TOC FU Call Status: Today's TOC FU Call Status:: Unsuccessful Call (1st Attempt) Unsuccessful Call (1st Attempt) Date: 10/27/22  Red on EMMI-ED Discharge Alert Date & Reason:10/21/22 "Scheduled follow-up appt? No"   Attempted to reach the patient regarding the most recent Inpatient/ED visit.  Follow Up Plan: Additional outreach attempts will be made to reach the patient to complete the Transitions of Care (Post Inpatient/ED visit) call.     Antionette Fairy, RN,BSN,CCM [facility] Care Management Care Management Community Coordinator Direct Phone: [phone] Toll Free: [phone] Fax: [phone]

## 2022-10-28 ENCOUNTER — Telehealth: Payer: Self-pay

## 2022-10-28 NOTE — Transitions of Care (Post Inpatient/ED Visit) (Signed)
10/28/2022  Name: Cyan Drakos MRN: [id number] DOB: 15-Dec-1945  Today's TOC FU Call Status: Today's TOC FU Call Status:: Unsuccessful Call (2nd Attempt) Unsuccessful Call (2nd Attempt) Date: 10/28/22  Attempted to reach the patient regarding the most recent Inpatient/ED visit.  Follow Up Plan: Additional outreach attempts will be made to reach the patient to complete the Transitions of Care (Post Inpatient/ED visit) call.     Antionette Fairy, RN,BSN,CCM [facility] Care Management Care Management Community Coordinator Direct Phone: [phone] Toll Free: [phone] Fax: [phone]

## 2022-10-29 ENCOUNTER — Telehealth: Payer: Self-pay

## 2022-10-29 NOTE — Transitions of Care (Post Inpatient/ED Visit) (Signed)
10/29/2022  Name: James Jones MRN: [id number] DOB: [birth year]  Today's TOC FU Call Status: Today's TOC FU Call Status:: Unsuccessful Call (3rd Attempt) Unsuccessful Call (3rd Attempt) Date: 10/29/22  Attempted to reach the patient regarding the most recent Inpatient/ED visit.  Follow Up Plan: No further outreach attempts will be made at this time. We have been unable to contact the patient.    Antionette Fairy, RN,BSN,CCM [facility] Care Management Care Management Community Coordinator Direct Phone: [phone] Toll Free: [phone] Fax: [phone]

## 2022-11-18 ENCOUNTER — Telehealth: Payer: Self-pay

## 2022-11-29 ENCOUNTER — Other Ambulatory Visit: Payer: Self-pay | Admitting: Internal Medicine

## 2022-11-30 ENCOUNTER — Encounter (HOSPITAL_BASED_OUTPATIENT_CLINIC_OR_DEPARTMENT_OTHER): Payer: Self-pay | Admitting: Family

## 2022-11-30 ENCOUNTER — Ambulatory Visit (HOSPITAL_BASED_OUTPATIENT_CLINIC_OR_DEPARTMENT_OTHER): Payer: Medicare Other | Admitting: Family

## 2022-11-30 VITALS — BP 110/66 | HR 63 | Resp 16 | Ht 68.0 in | Wt 201.0 lb

## 2022-11-30 DIAGNOSIS — E785 Hyperlipidemia, unspecified: Secondary | ICD-10-CM

## 2022-11-30 DIAGNOSIS — I10 Essential (primary) hypertension: Secondary | ICD-10-CM

## 2022-11-30 DIAGNOSIS — I25118 Atherosclerotic heart disease of native coronary artery with other forms of angina pectoris: Secondary | ICD-10-CM

## 2022-11-30 DIAGNOSIS — G4733 Obstructive sleep apnea (adult) (pediatric): Secondary | ICD-10-CM | POA: Diagnosis not present

## 2022-11-30 DIAGNOSIS — N1832 Chronic kidney disease, stage 3b: Secondary | ICD-10-CM | POA: Diagnosis not present

## 2022-11-30 DIAGNOSIS — I350 Nonrheumatic aortic (valve) stenosis: Secondary | ICD-10-CM | POA: Diagnosis not present

## 2022-11-30 MED ORDER — NITROGLYCERIN 0.4 MG SL SUBL: 0.4000 mg | SUBLINGUAL_TABLET | SUBLINGUAL | 4 refills | Status: AC | PRN

## 2022-11-30 NOTE — Progress Notes (Signed)
Cardiology Office Note:  .   Date:  11/30/2022  ID:  James Jones, DOB October 02, 1945, MRN [id number] PCP: Donita Brooks, MD  [facility] Providers Cardiologist:  Chilton Si, MD    History of Present Illness: .   James Jones is a 77 y.o. male  with a hx of CKD stage III, nonocclusive coronary artery disease, DM2, GERD, hyperlipidemia, hypertension, OSA on CPAP, aortic stenosis.     Remote cardiac catheterization February 2014 with 60 to 70% RCA stenosis.  Previous myalgias on atorvastatin but tolerates rosuvastatin.   He was seen by Dr. Okey Dupre of 09/25/2020.  He endorsed heartburn and trialed pantoprazole without much improvement. Grade 3 out of 6 murmur was noted on exam and echocardiogram ordered.  Blood pressure was poorly controlled and as such atenolol changed to carvedilol and doxazosin increased.  Amlodipine and valsartan were continued.  Renal artery duplex showed no renal artery stenosis bilaterally.  Echocardiogram 10/09/2020 with normal LVEF 60 to 65%, no R WMA, moderately elevated PASP, mild MR, mild to moderate aortic valve stenosis.    At follow up 11/2020 BP was well controlled. At follow up 04/2021 BP was elevated. Doxazosin increased to 4mg  BID. At follow up 04/2021 BP was well controlled. Carotid duplex 05/2021 minor carotid atherosclerosis negative for significant stenosis. Did note possible abnormal supraclavicular adenopathy and PCP recommended CT. Echo 05/2021 LVEF 60-65%, mild LVH, gr2DD, RV normal function, RV mildly enlarged, severely elevated PASP, bilateral atria mildly dilated, trivial MR, RA pressure , mild aortic stenosis. At follow up 06/04/21 no heart failure symptoms but did notice exertional dyspnea. PFTs, VQ scan ordered. VQ scan with no PE. CT 06/17/21 asymmetric left supraclavicular soft tissue which may reflect adjacent nonspecific nonenlarged lymph nodes. CT chest Lung-RADS2 recommended for follow up 12 mos.  PFTs were abnormal and referred to  pulmonology.     Seen 08/04/21 feeling overall well - exercising three times per week at [facility] for 30 minutes. Due to pulmonary hypertension, exertional dyspnea - referred to pulmonology. He was started on Anoro by pulmonology 08/2021.    Admitted 8/28-8/29/23 after presenting with chest pain. Chest pain was atypical sharp, left sided with positional component. HS troponin negative x3.  Noted to be bradycardic and hypotensive while admitted. Carvedilol decreased by half, Amlodipine discontinued, Doxazosin decreased. Echo 10/06/21 with EF 60-65%, mild LVH, gr1DD, mildly elevated PASP, LA mildly dilated, trivial MR, mild AS (mean gradient ).    Seen in clinic postdischarge and recommended for Myoview.  Myoview 10/29/2021 low risk study with no evidence of ischemia.   He presents today for follow-up independently.  Notes a couple weeks ago had "terrible chest pain" seen in ED 9/9 and 9/10 with workup overall unremarkable. Notes episodes occurring intermittently across his chest radiating to his right back. It occurs once or twice per week and lasts a few minutes.  Walking 6 miles three times per week at the [facility] with no chest pain nor dyspnea. No edema, orthopnea, PND.   ROS: Please see the history of present illness.    All other systems reviewed and are negative.   Studies Reviewed: .        Cardiac Studies & Procedures     STRESS TESTS  MYOCARDIAL PERFUSION IMAGING 10/29/2021  Narrative   The study is normal. The study is low risk.   Resting ECG rhythm shows sinus bradycardia. ECG demonstrates right bundle branch block. The ECG shows premature ventricular contractions.   No ST deviation was noted. There  were no arrhythmias during stress. There were no arrhythmias during recovery.   LV perfusion is normal. There is no evidence of ischemia. There is no evidence of infarction. There is a mild inferior wall perfusion defect on supine stress and upright stress imaging. Wall motion is normal in  this region, and there is a prominent loop of bowel adjacent to the inferior wall on stress images that may be contributing to attenuation artifact.   Left ventricular function is normal. Nuclear stress EF: 59 %. The left ventricular ejection fraction is normal (55-65%). End diastolic cavity size is normal. End systolic cavity size is normal.   Prior study not available for comparison.   Given artifact, consider alternate stress modality if symptoms are refractory to medical therapy.   ECHOCARDIOGRAM  ECHOCARDIOGRAM COMPLETE 10/06/2021  Narrative ECHOCARDIOGRAM REPORT    Patient Name:   James Jones Date of Exam: 10/06/2021 Medical Rec #:  [id number]    Height:       67.0 in Accession #:    [id number]   Weight:       208.0 lb Date of Birth:  1945-10-04   BSA:          2.056 m Patient Age:    77 years     BP:           111/52 mmHg Patient Gender: M            HR:           60 bpm. Exam Location:  Inpatient  Procedure: 2D Echo, Cardiac Doppler and Color Doppler  Indications:    Chest Pain  History:        Patient has prior history of Echocardiogram examinations, most recent 05/26/2021. CHF, CAD; Risk Factors:Dyslipidemia and Diabetes.  Sonographer:    Gaynell Face Referring Phys: [id number] JUSTIN B HOWERTER  IMPRESSIONS   1. Left ventricular ejection fraction, by estimation, is 60 to 65%. The left ventricle has normal function. The left ventricle has no regional wall motion abnormalities. There is mild concentric left ventricular hypertrophy. Left ventricular diastolic parameters are consistent with Grade I diastolic dysfunction (impaired relaxation). 2. Right ventricular systolic function is normal. The right ventricular size is normal. There is mildly elevated pulmonary artery systolic pressure. The estimated right ventricular systolic pressure is 38.8 mmHg. 3. Left atrial size was mildly dilated. 4. The mitral valve is normal in structure. Trivial mitral valve regurgitation. No  evidence of mitral stenosis. 5. The aortic valve was not well visualized. There is mild calcification of the aortic valve. There is mild thickening of the aortic valve. Aortic valve regurgitation is not visualized. Mild aortic valve stenosis. Aortic valve mean gradient measures 14.0 mmHg. Normal LV stroke volume index DVI 0.56. 6. The inferior vena cava is dilated in size with <50% respiratory variability, suggesting right atrial pressure of 15 mmHg.  Comparison(s): No significant change from prior study.  FINDINGS Left Ventricle: Left ventricular ejection fraction, by estimation, is 60 to 65%. The left ventricle has normal function. The left ventricle has no regional wall motion abnormalities. The left ventricular internal cavity size was normal in size. There is mild concentric left ventricular hypertrophy. Left ventricular diastolic parameters are consistent with Grade I diastolic dysfunction (impaired relaxation).  Right Ventricle: The right ventricular size is normal. No increase in right ventricular wall thickness. Right ventricular systolic function is normal. There is mildly elevated pulmonary artery systolic pressure. The tricuspid regurgitant velocity is 2.44 m/s, and with an assumed right atrial pressure  of 15 mmHg, the estimated right ventricular systolic pressure is 38.8 mmHg.  Left Atrium: Left atrial size was mildly dilated.  Right Atrium: Right atrial size was not well visualized.  Pericardium: There is no evidence of pericardial effusion.  Mitral Valve: The mitral valve is normal in structure. Trivial mitral valve regurgitation. No evidence of mitral valve stenosis.  Tricuspid Valve: The tricuspid valve is normal in structure. Tricuspid valve regurgitation is not demonstrated. No evidence of tricuspid stenosis.  Aortic Valve: The aortic valve was not well visualized. There is mild calcification of the aortic valve. There is mild thickening of the aortic valve. There is mild  aortic valve annular calcification. Aortic valve regurgitation is not visualized. Mild aortic stenosis is present. Aortic valve mean gradient measures 14.0 mmHg. Aortic valve peak gradient measures 25.8 mmHg. Aortic valve area, by VTI measures 1.95 cm.  Pulmonic Valve: The pulmonic valve was normal in structure. Pulmonic valve regurgitation is not visualized. No evidence of pulmonic stenosis.  Aorta: The aortic root is normal in size and structure.  Venous: The inferior vena cava is dilated in size with less than 50% respiratory variability, suggesting right atrial pressure of 15 mmHg.  IAS/Shunts: No atrial level shunt detected by color flow Doppler.   LEFT VENTRICLE PLAX 2D LVIDd:         4.40 cm   Diastology LVIDs:         3.00 cm   LV e' medial:    5.33 cm/s LV PW:         1.30 cm   LV E/e' medial:  16.0 LV IVS:        1.20 cm   LV e' lateral:   7.94 cm/s LVOT diam:     2.10 cm   LV E/e' lateral: 10.7 LV SV:         111 LV SV Index:   54 LVOT Area:     3.46 cm   RIGHT VENTRICLE RV S prime:     13.10 cm/s TAPSE (M-mode): 2.2 cm  LEFT ATRIUM             Index        RIGHT ATRIUM           Index LA diam:        3.30 cm 1.60 cm/m   RA Area:     18.60 cm LA Vol (A2C):   81.8 ml 39.81 ml/m  RA Volume:   48.90 ml  23.78 ml/m LA Vol (A4C):   64.8 ml 31.51 ml/m LA Biplane Vol: 70.1 ml 34.09 ml/m AORTIC VALVE AV Area (Vmax):    1.73 cm AV Area (Vmean):   1.66 cm AV Area (VTI):     1.95 cm AV Vmax:           254.00 cm/s AV Vmean:          167.500 cm/s AV VTI:            0.567 m AV Peak Grad:      25.8 mmHg AV Mean Grad:      14.0 mmHg LVOT Vmax:         127.00 cm/s LVOT Vmean:        80.500 cm/s LVOT VTI:          0.320 m LVOT/AV VTI ratio: 0.56  AORTA Ao Root diam: 3.20 cm  MITRAL VALVE               TRICUSPID VALVE MV Area (  PHT): 3.85 cm    TR Peak grad:   23.8 mmHg MV Decel Time: 197 msec    TR Vmax:        244.00 cm/s MV E velocity: 85.20 cm/s MV A  velocity: 95.40 cm/s  SHUNTS MV E/A ratio:  0.89        Systemic VTI:  0.32 m Systemic Diam: 2.10 cm  Riley Lam MD Electronically signed by Riley Lam MD Signature Date/Time: 10/06/2021/11:01:23 AM    Final             Risk Assessment/Calculations:             Physical Exam:   VS:  BP 110/66   Pulse 63   Resp 16   Ht 5\' 8"  (1.727 m)   Wt 201 lb (91.2 kg)   SpO2 97%   BMI 30.56 kg/m    Wt Readings from Last 3 Encounters:  11/30/22 201 lb (91.2 kg)  10/19/22 197 lb 3.2 oz (89.4 kg)  10/18/22 196 lb (88.9 kg)    GEN: Well nourished, well developed in no acute distress NECK: No JVD; No carotid bruits CARDIAC: RRR, no  rubs, gallops. Gr 3/6 murmur.  RESPIRATORY:  Clear to auscultation without rales, wheezing or rhonchi  ABDOMEN: Soft, non-tender, non-distended EXTREMITIES:  No edema; No deformity   ASSESSMENT AND PLAN: .    Murmur / Mild to moderate AS - Mild aortic stenosis by echo 05/2021 and 10/2021 with mean gradient 14.6mmHg. Continue optimal blood pressure control.Repeat echo ordered today.    Pulmonary hypertension / COPD - No heart failure symptoms. PFT 06/2021 abnormal referred to pulmonology. VQ 06/2021 no PE. Follows with pulmonology.    Hypertension - BP well controlled. Continue current antihypertensive regimen.      Dm2 - Follows with endocrinology. Appreciate inclusion of SGLT2i.   OSA on CPAP - CPAP compliance encouraged.   CKD IIIb- Careful titration of diuretic and antihypertensive.     CAD with 60 to 70% stenosis of RCA - 10/2021 low risk stress test. Present chest pain atypical for angina as occurs at rest or with activity and radiates to right shoulder blade. Reassurance provided. ED visit last month x2 unremarkable. If chest pain persists or becomes more typical, consider myoview. Avoid CT and LHC due to renal function. GDMT includes Coreg, Rosuvastatin, Aspirin. Heart healthy diet and regular cardiovascular exercise encouraged.      Hyperlipidemia, LDL goal less than 70 - 07/2022 LDL 66.  after on Crestor. LDL now at goal. Continue Crestor 40mg  daily. Denies myalgias.        Dispo: follow up in 6 mos  Signed, Alver Sorrow, NP

## 2022-11-30 NOTE — Patient Instructions (Signed)
Medication Instructions:  Your physician has recommended you make the following change in your medication:   For as needed Nitroglycerin, if you develop chest pain: Sit and rest 5 minutes. If chest pain does not resolve place 1 nitroglycerin under your tongue and wait 5 minutes. If chest pain does not resolve, place a 2nd nitroglycerin under your tongue and wait 5 more minutes. If chest pain does not resolve, place a 3rd nitroglycerin under your tongue and seek emergency services.    *If you need a refill on your cardiac medications before your next appointment, please call your pharmacy*   Testing/Procedures: Your physician has requested that you have an echocardiogram. Echocardiography is a painless test that uses sound waves to create images of your heart. It provides your doctor with information about the size and shape of your heart and how well your heart's chambers and valves are working. This procedure takes approximately one hour. There are no restrictions for this procedure. Please do NOT wear cologne, perfume, aftershave, or lotions (deodorant is allowed). Please arrive 15 minutes prior to your appointment time.  Follow-Up: At [facility], you and your health needs are our priority.  As part of our continuing mission to provide you with exceptional heart care, we have created designated Provider Care Teams.  These Care Teams include your primary Cardiologist (physician) and Advanced Practice Providers (APPs -  Physician Assistants and Nurse Practitioners) who all work together to provide you with the care you need, when you need it.  We recommend signing up for the patient portal called "MyChart".  Sign up information is provided on this After Visit Summary.  MyChart is used to connect with patients for Virtual Visits (Telemedicine).  Patients are able to view lab/test results, encounter notes, upcoming appointments, etc.  Non-urgent messages can be sent to your provider as  well.   To learn more about what you can do with MyChart, go to [email or web address].    Your next appointment:   Follow up with Dr. Duke Salvia or Gillian Shields, NP in 6 months.

## 2022-12-11 ENCOUNTER — Other Ambulatory Visit: Payer: Self-pay | Admitting: Family Medicine

## 2022-12-11 DIAGNOSIS — M109 Gout, unspecified: Secondary | ICD-10-CM

## 2022-12-13 NOTE — Telephone Encounter (Signed)
Requested medication (s) are due for refill today: no  Requested medication (s) are on the active medication list: yes Last refill:  10/14/22 #60 1 RF  Future visit scheduled: no  Notes to clinic:  overdue uric acid level   Requested Prescriptions  Pending Prescriptions Disp Refills   allopurinol (ZYLOPRIM) 100 MG tablet [Pharmacy Med Name: ALLOPURINOL 100 MG TABLET] 90 tablet 2    Sig: TAKE 1 TABLET BY MOUTH EVERY DAY     Endocrinology:  Gout Agents - allopurinol Failed - 12/11/2022  1:30 PM      Failed - Uric Acid in normal range and within 360 days    Uric Acid, Serum  Date Value Ref Range Status  02/19/2019 5.7 4.0 - 8.0 mg/dL Final    Comment:    Therapeutic target for gout patients: <6.0 mg/dL .          Failed - Cr in normal range and within 360 days    Creat  Date Value Ref Range Status  07/30/2022 2.04 (H) 0.70 - 1.28 mg/dL Final   Creatinine, Ser  Date Value Ref Range Status  10/19/2022 2.11 (H) 0.61 - 1.24 mg/dL Final   Creatinine, Urine  Date Value Ref Range Status  07/30/2022 52 20 - 320 mg/dL Final         Failed - Valid encounter within last 12 months    Recent Outpatient Visits           1 year ago Benign essential HTN   [facility] Tanya Nones, Priscille Heidelberg, MD   2 years ago Benign essential HTN   [facility] Pickard, Priscille Heidelberg, MD   2 years ago Urinary frequency   [facility] Pickard, Priscille Heidelberg, MD   2 years ago General medical exam   [facility] Donita Brooks, MD   3 years ago Left sided sciatica   [facility] Donita Brooks, MD       Future Appointments             In 4 weeks Lennette Bihari, MD [facility] at [facility]            Passed - CBC within normal limits and completed in the last 12 months    WBC  Date Value Ref Range Status  10/19/2022 6.5 4.0 - 10.5 K/uL Final   RBC  Date Value Ref Range Status  10/19/2022 4.51 4.22 -  5.81 MIL/uL Final   Hemoglobin  Date Value Ref Range Status  10/19/2022 12.7 (L) 13.0 - 17.0 g/dL Final   HCT  Date Value Ref Range Status  10/19/2022 38.0 (L) 39.0 - 52.0 % Final   MCHC  Date Value Ref Range Status  10/19/2022 33.4 30.0 - 36.0 g/dL Final   [facility]  Date Value Ref Range Status  10/19/2022 28.2 26.0 - 34.0 pg Final   MCV  Date Value Ref Range Status  10/19/2022 84.3 80.0 - 100.0 fL Final   No results found for: "PLTCOUNTKUC", "LABPLAT", "POCPLA" RDW  Date Value Ref Range Status  10/19/2022 14.2 11.5 - 15.5 % Final

## 2022-12-15 DIAGNOSIS — H02402 Unspecified ptosis of left eyelid: Secondary | ICD-10-CM | POA: Diagnosis not present

## 2022-12-15 DIAGNOSIS — H04123 Dry eye syndrome of bilateral lacrimal glands: Secondary | ICD-10-CM | POA: Diagnosis not present

## 2022-12-22 ENCOUNTER — Other Ambulatory Visit (HOSPITAL_BASED_OUTPATIENT_CLINIC_OR_DEPARTMENT_OTHER): Payer: Medicare Other

## 2022-12-28 ENCOUNTER — Other Ambulatory Visit: Payer: Self-pay

## 2022-12-28 ENCOUNTER — Ambulatory Visit (INDEPENDENT_AMBULATORY_CARE_PROVIDER_SITE_OTHER): Payer: Medicare Other | Admitting: Internal Medicine

## 2022-12-28 ENCOUNTER — Encounter: Payer: Self-pay | Admitting: Internal Medicine

## 2022-12-28 ENCOUNTER — Telehealth: Payer: Self-pay

## 2022-12-28 VITALS — BP 126/70 | HR 69 | Ht 68.0 in | Wt 199.0 lb

## 2022-12-28 DIAGNOSIS — E66811 Obesity, class 1: Secondary | ICD-10-CM

## 2022-12-28 DIAGNOSIS — E785 Hyperlipidemia, unspecified: Secondary | ICD-10-CM | POA: Diagnosis not present

## 2022-12-28 DIAGNOSIS — Z6830 Body mass index (BMI) 30.0-30.9, adult: Secondary | ICD-10-CM

## 2022-12-28 DIAGNOSIS — I10 Essential (primary) hypertension: Secondary | ICD-10-CM

## 2022-12-28 DIAGNOSIS — E1159 Type 2 diabetes mellitus with other circulatory complications: Secondary | ICD-10-CM | POA: Diagnosis not present

## 2022-12-28 DIAGNOSIS — Z7984 Long term (current) use of oral hypoglycemic drugs: Secondary | ICD-10-CM

## 2022-12-28 LAB — POCT GLYCOSYLATED HEMOGLOBIN (HGB A1C): Hemoglobin A1C: 6.5 % — AB (ref 4.0–5.6)

## 2022-12-28 MED ORDER — VALSARTAN 320 MG PO TABS: 320.0000 mg | ORAL_TABLET | Freq: Every day | ORAL | 0 refills | Status: DC

## 2022-12-28 NOTE — Telephone Encounter (Signed)
Prescription Request  12/28/2022  LOV: 10/19/22  What is the name of the medication or equipment? valsartan (DIOVAN) 320 MG tablet [[id number]]   Have you contacted your pharmacy to request a refill? Yes   Which pharmacy would you like this sent to?  [id number] [place], [place] - [address] [place] [address] Phone: [phone] Fax: [phone]    Patient notified that their request is being sent to the clinical staff for review and that they should receive a response within 2 business days.   Please advise at [facility] [phone]

## 2022-12-28 NOTE — Patient Instructions (Addendum)
Please continue: - Farxiga 10 mg before b'fast.  Please return in 4-6 months with your sugar log.

## 2022-12-28 NOTE — Progress Notes (Signed)
Patient ID: James Jones, male   DOB: 08-15-1945, 77 y.o.   MRN: [id number]  HPI: James Jones is a 77 y.o.-year-old male, returning for f/u for DM2, dx in ~2005, non-insulin-dependent, lately more controlled, with complications (CAD, CKD). Last visit 4 months ago. PCP: Dr. Lynnea Ferrier  Interim history: No blurry vision, nausea, chest pain.  He has R shoulder pain - started 2 weeks ago.  Most bothersome when he lays down. No steroids since last OV.  Reviewed HbA1c levels: Lab Results  Component Value Date   HGBA1C 6.6 (H) 07/30/2022   HGBA1C 6.9 (A) 04/22/2022   HGBA1C 6.5 (A) 09/08/2021  04/11/2014: HbA1c 8.4% 01/08/2014: HbA1c 7.7% He had steroid inj for gout in his elbows - 12/2013. He also had a steroid inj in knee fall 2016, too.   He is on: - Farxiga 10 mg daily - started by PCP - very expensive (600$ for 3 mo >> now cheaper: 464$) We had to stop Januvia since last visit due to price.  Tradjenta was also not covered. Previously on low-dose Metformin ER, but stopped 07/2018 due to CKD Had nausea, vomiting, loss of appetite with regular metformin.  He was on Actos. He was on insulin before (Lantus) - came off years ago. We stopped glipizide ER 2.5 mg daily in 09/2021.  Pt checks his sugars 1-2 times a day,: - am: 106-123 >> 105-147, 161 (steroid) >> 96-120 - 2h after b'fast:119, 133 >> n/c >> 118 >> n/c >> 127 - before lunch:  89-134 >> 227  (steroid)  >> 118, 120 - 2h after lunch: 102-149 >> 120 >> 137-157 - before dinner:  87, 109, 190 (steroid) >> 94-134 - 2h after dinner:  n/c >> 187 >> n/c >> 190 - bedtime: 88, 172 >> 52, 122 >> 113 - nighttime: 122 >> n/c >> 174 >> n/c Lowest 88 >> 52 x1 (no appetite, did not eat all day) >> 94; he has hypoglycemia awareness in the 70s. Highest sugar was 285 (Prednisone) >> ... 227 (Prednisone) >> 190 (pasta).  Glucometer: Freestyle [facility] did not cover Freestyle Libre CGM.  Pt's meals are: - Breakfast: bacon + eggs, sausage,  grits, oatmeal, cereal, toast wheat - Lunch: BLT, soups, fruit, nuts - Dinner: chicken, pork + greens, rice  - Snacks: 1 a day: pretzels; carrots  Previously exercising by walking several times a week, but stopped exercising due to the coronavirus pandemic. Walking 6 mi MWF at the [facility].  -+ CKD-sees nephrology (Dr. Vallery Sa) (renal ultrasound showed renal cysts), latest BUN/creatinine:  Lab Results  Component Value Date   BUN 20 10/19/2022   CREATININE 2.11 (H) 10/19/2022  05/24/2014: 34/1.64 01/08/2014: 18/1.15 On valsartan.  -+ HL; last set of lipids: Lab Results  Component Value Date   CHOL 143 07/30/2022   HDL 59 07/30/2022   LDLCALC 66 07/30/2022   TRIG 92 07/30/2022   CHOLHDL 2.4 07/30/2022  On rosuvastatin 40, fish oil.  He still has some muscle aches, improved.  He had more muscle aches when he was on pravastatin 40 before.  - last eye exam was 05/11/2022: No DR.  He has history of cataract surgery in 2016 and 17.   - no numbness and tingling in his feet. + Occasional pain in hands and feet. Last foot exam 08/10/2022.  Latest TSH was normal: Lab Results  Component Value Date   TSH 1.740 10/02/2020   ROS: + see HPI  I reviewed pt's medications, allergies, PMH, social hx, family hx,  and changes were documented in the history of present illness. Otherwise, unchanged from my initial visit note.  Past Medical History:  Diagnosis Date   Aortic stenosis 04/02/2021   Chronic diastolic heart failure (HCC)    CKD (chronic kidney disease) stage 2, GFR 60-89 ml/min 03/17/2012   Coronary artery disease, non-occlusive 03/18/2012   60-70% stenosis in RCA -- FFR 0.84; EF 60-65%   Diabetes mellitus without complication (HCC)    GERD (gastroesophageal reflux disease)    On PPI   History of GI bleed 02/2013   No obvious findings on EGD/colonoscopy   Hydrocele, bilateral    large on [place] 2020   Hyperlipidemia    Hypertension    Obesity (BMI 30.0-34.9) 10/02/2012   OSA  (obstructive sleep apnea) 03/18/2012   Doing better with CPAP   Pulmonary hypertension (HCC) 12/10/2010   ECHO:  Mild PH,mild LVH; PA pressures estimated 30-40 mmHg   Pulmonary hypertension, unspecified (HCC) 06/04/2021   RBBB, 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   Event monitor; usually during sleeping hours; therefore not on beta blocker  + Gout  Past Surgical History:  Procedure Laterality Date   APPENDECTOMY  1974   CARPAL TUNNEL RELEASE     EYE SURGERY  1960   Left eye   HEMORRHOID SURGERY     LEFT HEART CATH AND CORONARY ANGIOGRAPHY  03/27/2010   Moderate mid RCA lesion,right radial approach,normal EF   LEFT HEART CATHETERIZATION WITH CORONARY ANGIOGRAM N/A 03/17/2012   Procedure: LEFT HEART CATHETERIZATION WITH CORONARY ANGIOGRAM;  Surgeon: Marykay Lex, MD;  Location: [facility] CATH LAB;  Service: Cardiovascular: Fractional Flow Reserve Measurement of mid RCA 60-70% stenosis = 0.84. Otherwise mild LCA CAD.  Normal EF & EDP   SHOULDER ARTHROSCOPY  2006   TRANSTHORACIC ECHOCARDIOGRAM  12/2010    Mild concentric LVH.  EF> 55%.  GR 1 DD.  Mild aortic sclerosis no stenosis.  PA pressures estimated 30-40 mmHg   History   Social History   Marital Status: Married    Spouse Name: N/A   Occupational History   retired   Social History Main Topics   Smoking status: Former Smoker -- 2.00 packs/day    Quit date: 04/12/2012   Smokeless tobacco: Never Used   Alcohol Use: 0.5 oz/week    1 drink(s) per week   Drug Use: Yes    Special: Marijuana     Comment: cannibus   Social History Narrative   Married, father of 2, grandfather of 3.   He is a former smoker of about a pack to pack and half cigarettes a day -- he quit in March of this year.    He is an avid exerciser working at least 4-5 days a week doing her walking or stationary bike.   Current Outpatient Medications on File Prior to Visit  Medication Sig Dispense Refill   albuterol (VENTOLIN HFA) 108 (90 Base)  MCG/ACT inhaler Inhale 2 puffs into the lungs every 6 (six) hours as needed for wheezing or shortness of breath. 8 g 6   allopurinol (ZYLOPRIM) 100 MG tablet Take 1 tablet (100 mg total) by mouth daily. 60 tablet 1   amLODipine (NORVASC) 10 MG tablet Take 1 tablet (10 mg total) by mouth daily. 90 tablet 3   aspirin EC 81 MG tablet Take 1 tablet (81 mg total) by mouth daily. Swallow whole. 90 tablet 3   benzonatate (TESSALON) 200 MG capsule Take 1 capsule (200 mg total) by mouth 3 (three) times  daily as needed for cough. 30 capsule 0   Blood Glucose Monitoring Suppl (FREESTYLE LITE) DEVI Use to check blood sugar once daily 1 each 0   carvedilol (COREG) 25 MG tablet Take 1 tablet (25 mg total) by mouth 2 (two) times daily. 180 tablet 1   Cholecalciferol (VITAMIN D-3) 1000 UNITS CAPS Take by mouth daily. 2 capsules.     dapagliflozin propanediol (FARXIGA) 10 MG TABS tablet Take 1 tablet (10 mg total) by mouth daily before breakfast. 90 tablet 3   doxazosin (CARDURA) 8 MG tablet Take 1 tablet (8 mg total) by mouth 2 (two) times daily. 180 tablet 3   famotidine (PEPCID) 20 MG tablet Take 1 tablet (20 mg total) by mouth at bedtime. 30 tablet 11   ferrous sulfate 325 (65 FE) MG tablet Take 1 tablet (325 mg total) by mouth 2 (two) times daily with a meal. 180 tablet 3   fish oil-omega-3 fatty acids 1000 MG capsule Take 1,200 mg by mouth daily.      FREESTYLE LITE test strip USE TO CHECK BLOOD SUGAR 1-2X DAILY. E11.59 100 strip 1   Multiple Vitamin (MULTIVITAMIN WITH MINERALS) TABS Take 1 tablet by mouth daily.     nitroGLYCERIN (NITROSTAT) 0.4 MG SL tablet Place 1 tablet (0.4 mg total) under the tongue every 5 (five) minutes as needed for chest pain. 25 tablet 4   pantoprazole (PROTONIX) 40 MG tablet Take 1 tablet (40 mg total) by mouth daily. 30 tablet 11   PRESCRIPTION MEDICATION Uses C-PAP at bedtime     rosuvastatin (CRESTOR) 40 MG tablet TAKE 1 TABLET BY MOUTH EVERY DAY 90 tablet 1   sodium  bicarbonate 650 MG tablet Take 650 mg by mouth daily.     umeclidinium-vilanterol (ANORO ELLIPTA) 62.5-25 MCG/ACT AEPB Inhale 1 puff into the lungs daily. 7 each 0   valsartan (DIOVAN) 320 MG tablet TAKE 1 TABLET BY MOUTH EVERY DAY 90 tablet 0   No current facility-administered medications on file prior to visit.   Allergies  Allergen Reactions   Atorvastatin     myalgias   Family History  Problem Relation Age of Onset   Hypertension Mother    Kidney failure Mother    Coronary artery disease Father    Hypertension Sister    Kidney failure Sister    Stroke Maternal Grandmother    PE: BP 126/70   Pulse 69   Ht 5\' 8"  (1.727 m)   Wt 199 lb (90.3 kg)   SpO2 97%   BMI 30.26 kg/m   Wt Readings from Last 10 Encounters:  12/28/22 199 lb (90.3 kg)  11/30/22 201 lb (91.2 kg)  10/19/22 197 lb 3.2 oz (89.4 kg)  10/18/22 196 lb (88.9 kg)  08/24/22 192 lb 9.6 oz (87.4 kg)  08/10/22 199 lb (90.3 kg)  04/29/22 202 lb (91.6 kg)  04/22/22 202 lb 12.8 oz (92 kg)  12/09/21 208 lb 6.4 oz (94.5 kg)  11/20/21 205 lb (93 kg)   Constitutional: overweight, in NAD Eyes: EOMI, no exophthalmos ENT: no thyromegaly, no cervical lymphadenopathy Cardiovascular: RRR, No RG, + 2/6 SEM Respiratory: CTA B Musculoskeletal: no deformities Skin: no rashes Neurological: no tremor with outstretched hands  ASSESSMENT: 1. DM2, non-insulin-dependent, uncontrolled, with complications - CAD - cardiologist Dr. Herbie Baltimore - CKD - was seeing Dr Kristian Covey >> Dr. Vallery Sa  2. HL  3.  Obesity class I  PLAN:  1. Patient with, previously uncontrolled type 2 diabetes, with much improved control in  the last several years.  He was previously on DPP 4 inhibitor and low-dose  sulfonylurea but currently on SGLT2 inhibitor, which is not affordable at last visit, sugars are higher while on prednisone but they started to improve so we did not change his regimen.  HbA1c at that time was lower, at 6.6%. -At today's visit,  sugars are at goal, per review of his detailed log.  No need to change his regimen for now. - I suggested to:  Patient Instructions  Please continue: - Farxiga 10 mg before b'fast.  Please return in 4-6 months with your sugar log.   - we checked his HbA1c: 6.5% (lower) - advised to check sugars at different times of the day - 1x a day, rotating check times - advised for yearly eye exams >> he is UTD - return to clinic in 4-6  months  2. HL -Reviewed latest lipid panel from 07/2022: LDL slightly higher than our goal of less than 55 due to cardiovascular disease, otherwise fractions at goal: Lab Results  Component Value Date   CHOL 143 07/30/2022   HDL 59 07/30/2022   LDLCALC 66 07/30/2022   TRIG 92 07/30/2022   CHOLHDL 2.4 07/30/2022  -He had myalgias on atorvastatin and also on rosuvastatin, but he tolerates well Crestor 40 mg daily  3. Obesity class I -He is on Farxiga 10 mg daily which should also help with weight loss -We discussed in the past about cutting down fatty foods -He lost 25 pounds before the last 5 visits combined, but he gained a net 7 pounds since last visit  Carlus Pavlov, MD PhD [facility] Endocrinology

## 2023-01-11 ENCOUNTER — Ambulatory Visit: Payer: Medicare Other | Attending: Cardiovascular Disease | Admitting: Cardiovascular Disease

## 2023-01-11 ENCOUNTER — Encounter: Payer: Self-pay | Admitting: Cardiovascular Disease

## 2023-01-11 VITALS — BP 128/86 | HR 64 | Ht 68.0 in | Wt 196.6 lb

## 2023-01-11 DIAGNOSIS — I441 Atrioventricular block, second degree: Secondary | ICD-10-CM | POA: Diagnosis not present

## 2023-01-11 DIAGNOSIS — G4733 Obstructive sleep apnea (adult) (pediatric): Secondary | ICD-10-CM | POA: Diagnosis not present

## 2023-01-11 DIAGNOSIS — I10 Essential (primary) hypertension: Secondary | ICD-10-CM | POA: Insufficient documentation

## 2023-01-11 DIAGNOSIS — I2510 Atherosclerotic heart disease of native coronary artery without angina pectoris: Secondary | ICD-10-CM | POA: Diagnosis not present

## 2023-01-11 DIAGNOSIS — I4510 Unspecified right bundle-branch block: Secondary | ICD-10-CM | POA: Diagnosis not present

## 2023-01-11 DIAGNOSIS — E785 Hyperlipidemia, unspecified: Secondary | ICD-10-CM | POA: Insufficient documentation

## 2023-01-11 DIAGNOSIS — I350 Nonrheumatic aortic (valve) stenosis: Secondary | ICD-10-CM | POA: Insufficient documentation

## 2023-01-11 NOTE — Patient Instructions (Signed)
Medication Instructions:  NO MEDICATION CHANGES *If you need a refill on your cardiac medications before your next appointment, please call your pharmacy*   Lab Work: NONE If you have labs (blood work) drawn today and your tests are completely normal, you will receive your results only by: MyChart Message (if you have MyChart) OR A paper copy in the mail If you have any lab test that is abnormal or we need to change your treatment, we will call you to review the results.   Testing/Procedures: NO TESTING ORDERED   Follow-Up: At [facility], you and your health needs are our priority.  As part of our continuing mission to provide you with exceptional heart care, we have created designated Provider Care Teams.  These Care Teams include your primary Cardiologist (physician) and Advanced Practice Providers (APPs -  Physician Assistants and Nurse Practitioners) who all work together to provide you with the care you need, when you need it.  We recommend signing up for the patient portal called "MyChart".  Sign up information is provided on this After Visit Summary.  MyChart is used to connect with patients for Virtual Visits (Telemedicine).  Patients are able to view lab/test results, encounter notes, upcoming appointments, etc.  Non-urgent messages can be sent to your provider as well.   To learn more about what you can do with MyChart, go to [email or web address].    Your next appointment:    AS NEEDED FOR SLEEP  Provider:   Chilton Si, MD

## 2023-01-11 NOTE — Progress Notes (Unsigned)
Patient ID: James Jones, male   DOB: November 27, 1945, 77 y.o.   MRN: [id number]       Primary cardiology: Dr. Bryan Lemma > Dr. Duke Salvia Primary: Lynnea Ferrier, MD  HPI: James Jones is a 77 y.o. male who presents to the office today for a 13 month follow-up sleep clinic evaluation.   James Jones originally from [place] to help. While in [place] in the 1990's  he was diagnosed with sleep apnea. He was given a CPAP unit. However, he never used CPAP. He has a history of hypertension, hyperlipidemia, diabetes mellitus, GERD, and remote tobacco use. He smoked for approximately 45 years but quit smoking in March 2000.  In February 2014  he was referred for a split-night sleep study. This confirmed severe sleep apnea. At that time he has significant daytime sleepiness with an Epworth scale of 15. AHI was 69.3 per hour and REM sleep was increased at 71.5 events per hour. He dropped his O2 saturation to 81% with REM sleep and had loud snoring. He underwent a split-night evaluation. He also was prescribed an 18 cm fixed water pressure.  Since initiating CPAP therapy, he does feel he is sleeping better he typically goes to have possibly 9:30 at night and wakes up around 5 to 5:30 in the morning. A download  from 06/21/2012 through June 2014 revealed 77% of the days with usage. He admits that he did not use it when he went on vacation. His average usage on days used to 6 hours and 2 minutes. He had 70% of days greater than 4 hours. HI was excellent at 1.4 is 18 cm water pressure.  When I last saw him in 2017 he was doing well.  He was compliant.  I saw him in September 13, 2016 at which time he was continuing to use CPAP regularly.   He typically goes to bed around 10 PM and wakes up between 5 and 6 AM.  I obtained a new download in the office today from June 16 through July 15.  This reveals usage stays at 87%, and uses greater than 4 hours at 77%.  He was averaging 6 hours 1 minute of sleep.   CPAP set pressure was 18 cm.  AHI was excellent at 1.4.  There was no significant leak.  He states he exercises 3 days per week, typically walks up to 4 miles.  He denied any chest pain or palpitations. An Epworth scale in the office today and this endorsed at 8 with moderate chance of dozing while watching TV, sitting inactive in a public place, lying down to rest in the afternoon when circumstances persist, and slight chance of dozing as a passenger in a car for an hour without a break and will sitting quietly after lunch without alcohol.  He was seen after 3 years  in May 2021.  Over this time span, Mr. Sliman states he has continued to be exceptionally compliant with CPAP use.  He states his use is at least 95 to close to 100% of the nights.  He typically goes to bed between 10 and 11 PM and wakes up at 5:30 AM.  He is sleeping well.  His sleep is restorative.  He is unaware of breakthrough snoring.  However he still has nocturia at least 2-3 times per night. A new Epworth Sleepiness Scale score was calculated in the office and this endorsed at 5 the high chance of dozing while watching television and a  moderate chance of dozing while sitting inactive in a public place arguing against residual daytime sleepiness.  He  saw Dr. Herbie Baltimore for his primary cardiology care.  He is on amlodipine 10 mg, atenolol 50 mg and valsartan 3 20 mg for hypertension.  Remotely he was on Crestor and apparently at that time he was on pravastatin for lipid management.  Of note, LDL cholesterol was elevated at 141 on April 17, 2019 with total cholesterol 223.  He is diabetic on glipizide.  With his LDL increase, he was subsequently started back on rosuvastatin 20 mg in place of pravastatin.  I saw him on October 08, 2019.  Over the previous months he had continued to do well.  He was planning to reestablish his cardiology care with Dr. Duke Salvia.  When I saw him in May 2021, he qualified for a new ResMed air sense 10 CPAP auto unit.   He received a new machine.  A new download was obtained in the office today from July 31 through October 07, 2019 which showed 100% compliance with average usage at 6 hours and 41 minutes. His AutoSet unit minimum setting was 5 up to a maximum of 20.  95th percentile pressure was 14.5 with a maximum average pressure of 16.9.  AHI calculated at 4.0.  He continued to be on amlodipine 10 mg, atenolol 50 mg, furosemide as needed, and valsartan 320 mg daily for hypertension.  He is diabetic on glipizide.  He continued to be on rosuvastatin and has not had follow-up laboratory since this had been reinstituted.  I saw him on November 19, 2020.  Since his prior evaluation he remained compliant with CPAP use.  He established his cardiology care with Dr. Duke Salvia.  His blood pressure has been stable on a regimen consisting of amlodipine 10 mg, carvedilol 25 mg twice a day, doxazosin 4 mg daily, valsartan 320 mg.  He is diabetic on Farxiga 10 mg daily in addition to glipizide.  He is on pantoprazole for GERD and takes allopurinol for history of gout.  Obtained a new download of his CPAP therapy from October 20, 2020 through November 18, 2020.  Compliance is excellent.  Average sleep duration is 6 hours and 44 minutes.  He typically goes to bed at 11 PM and wakes up at 6 AM.  His AutoSet unit is set at a minimum pressure of 8 with maximum of 20 and his AHI is 2.5.  He is sleeping well.  He denies breakthrough snoring.  He still wakes up several times per night for nocturia.  Due to progressive increase in creatinine, he was referred by Dr. Tanya Nones to [facility] and had seen a nephrologist whose name at present he could not remember.    I last saw him on December 09, 2021 at which time he denied chest pain or shortness of breath.  He was continuing to use CPAP therapy.   A download was obtained from October 2 through December 08, 2021.  Compliance is excellent with 100% use with average use at 7 hours and 36 minutes.  His  ResMed AirSense 10 AutoSet unit is set at a pressure range of 10 to 20 cm with a 95th percentile pressure at 14.7 and maximum average pressure at 17.1.  AHI is 2.8.  He typically goes to bed between 9 and 10 PM and wakes up between 5 and 6 AM.  He has been exercising 3 days/week.  He continues to be on carvedilol 12.5 mg twice a  day, Farxiga 10 mg daily, valsartan 320 mg daily in addition to doxazosin 8 mg twice a day for blood pressure.  He also is on an Anora Ellipta, as needed albuterol for his lungs.  He is on rosuvastatin 40 mg and over-the-counter fish oil for hyperlipidemia and pantoprazole for GERD.   Since I last saw him, he had recently seen Gillian Shields, NP on November 30, 2022 and prior to that evaluation was experiencing some chest pain.  His chest pain was felt to be atypical and not consistent with ischemia.  He has documented mild to moderate AS on echo Doppler assessment.  He continues to be followed by Dr. Duke Salvia for hypertension and sees pulmonary for COPD.  From a sleep perspective, he continues to use CPAP therapy.  A download was obtained from November 2 through January 09, 2023 which shows excellent compliance at 100% with average use of 7 hours and 28 minutes.  CPAP is set at a pressure range of 12 to 20 cm.  AHI is 2.5.  95th percentile pressure is 15.6 with maximum average pressure 17.1.  He is sleeping well.  He denies residual daytime sleepiness.  Epworth Sleepiness Scale score was calculated in the office today and this endorsed at 8 5 mg residual daytime sleepiness.  He presents for a yearly follow-up sleep evaluation.   Past Medical History:  Diagnosis Date   Aortic stenosis 04/02/2021   Chronic diastolic heart failure (HCC)    CKD (chronic kidney disease) stage 2, GFR 60-89 ml/min 03/17/2012   Coronary artery disease, non-occlusive 03/18/2012   60-70% stenosis in RCA -- FFR 0.84; EF 60-65%   Diabetes mellitus without complication (HCC)    GERD (gastroesophageal reflux  disease)    On PPI   History of GI bleed 02/2013   No obvious findings on EGD/colonoscopy   Hydrocele, bilateral    large on [place] 2020   Hyperlipidemia    Hypertension    Obesity (BMI 30.0-34.9) 10/02/2012   OSA (obstructive sleep apnea) 03/18/2012   Doing better with CPAP   Pulmonary hypertension (HCC) 12/10/2010   ECHO:  Mild PH,mild LVH; PA pressures estimated 30-40 mmHg   Pulmonary hypertension, unspecified (HCC) 06/04/2021   RBBB, 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   Event monitor; usually during sleeping hours; therefore not on beta blocker    Past Surgical History:  Procedure Laterality Date   APPENDECTOMY  1974   CARPAL TUNNEL RELEASE     EYE SURGERY  1960   Left eye   HEMORRHOID SURGERY     LEFT HEART CATH AND CORONARY ANGIOGRAPHY  03/27/2010   Moderate mid RCA lesion,right radial approach,normal EF   LEFT HEART CATHETERIZATION WITH CORONARY ANGIOGRAM N/A 03/17/2012   Procedure: LEFT HEART CATHETERIZATION WITH CORONARY ANGIOGRAM;  Surgeon: Marykay Lex, MD;  Location: [facility] CATH LAB;  Service: Cardiovascular: Fractional Flow Reserve Measurement of mid RCA 60-70% stenosis = 0.84. Otherwise mild LCA CAD.  Normal EF & EDP   SHOULDER ARTHROSCOPY  2006   TRANSTHORACIC ECHOCARDIOGRAM  12/2010    Mild concentric LVH.  EF> 55%.  GR 1 DD.  Mild aortic sclerosis no stenosis.  PA pressures estimated 30-40 mmHg    Allergies  Allergen Reactions   Atorvastatin     myalgias    Current Outpatient Medications  Medication Sig Dispense Refill   albuterol (VENTOLIN HFA) 108 (90 Base) MCG/ACT inhaler Inhale 2 puffs into the lungs every 6 (six) hours as needed for wheezing or shortness of breath.  8 g 6   allopurinol (ZYLOPRIM) 100 MG tablet Take 1 tablet (100 mg total) by mouth daily. 60 tablet 1   amLODipine (NORVASC) 10 MG tablet Take 10 mg by mouth daily.     aspirin EC 81 MG tablet Take 1 tablet (81 mg total) by mouth daily. Swallow whole. 90 tablet 3   Blood Glucose  Monitoring Suppl (FREESTYLE LITE) DEVI Use to check blood sugar once daily 1 each 0   carvedilol (COREG) 25 MG tablet Take 1 tablet (25 mg total) by mouth 2 (two) times daily. 180 tablet 1   Cholecalciferol (VITAMIN D-3) 1000 UNITS CAPS Take by mouth daily. 2 capsules.     dapagliflozin propanediol (FARXIGA) 10 MG TABS tablet Take 1 tablet (10 mg total) by mouth daily before breakfast. 90 tablet 3   doxazosin (CARDURA) 8 MG tablet Take 1 tablet (8 mg total) by mouth 2 (two) times daily. 180 tablet 3   famotidine (PEPCID) 20 MG tablet Take 1 tablet (20 mg total) by mouth at bedtime. 30 tablet 11   ferrous sulfate 325 (65 FE) MG tablet Take 1 tablet (325 mg total) by mouth 2 (two) times daily with a meal. 180 tablet 3   fish oil-omega-3 fatty acids 1000 MG capsule Take 1,200 mg by mouth daily.      FREESTYLE LITE test strip USE TO CHECK BLOOD SUGAR 1-2X DAILY. E11.59 100 strip 1   Multiple Vitamin (MULTIVITAMIN WITH MINERALS) TABS Take 1 tablet by mouth daily.     nitroGLYCERIN (NITROSTAT) 0.4 MG SL tablet Place 1 tablet (0.4 mg total) under the tongue every 5 (five) minutes as needed for chest pain. 25 tablet 4   pantoprazole (PROTONIX) 40 MG tablet Take 1 tablet (40 mg total) by mouth daily. 30 tablet 11   PRESCRIPTION MEDICATION Uses C-PAP at bedtime     rosuvastatin (CRESTOR) 40 MG tablet TAKE 1 TABLET BY MOUTH EVERY DAY 90 tablet 1   sodium bicarbonate 650 MG tablet Take 650 mg by mouth daily.     umeclidinium-vilanterol (ANORO ELLIPTA) 62.5-25 MCG/ACT AEPB Inhale 1 puff into the lungs daily. 7 each 0   valsartan (DIOVAN) 320 MG tablet Take 1 tablet (320 mg total) by mouth daily. 90 tablet 0   No current facility-administered medications for this visit.    Socially He is from [place]; he is has 2 children 3 grandchildren. It smoked for over 40 years and quit smoking in March 2014. Does drink occasional alcohol. He does exercise. He is originally from [facility] [place]. He is looking  [place] area for 4 years  ROS General: Negative; No fevers, chills, or night sweats;  HEENT: Negative; No changes in vision or hearing, sinus congestion, difficulty swallowing Pulmonary: Negative; No cough, wheezing, shortness of breath, hemoptysis Cardiovascular: Positive for hypertension, negative for chest pain PND orthopnea GI: Negative; No nausea, vomiting, diarrhea, or abdominal pain GU: Positive for urination at least 2 times per night. Musculoskeletal: Negative; no myalgias, joint pain, or weakness History of gout Hematologic/Oncology: Negative; no easy bruising, bleeding Endocrine: Negative; no heat/cold intolerance; no diabetes Neuro: Negative; no changes in balance, headaches Skin: Negative; No rashes or skin lesions Psychiatric: Negative; No behavioral problems, depression Sleep: Positive for severe obstructive sleep apnea on CPAP therapy.  No residual snoring or daytime sleepiness, hypersomnolence, bruxism, restless legs, hypnogognic hallucinations, no cataplexy Other comprehensive 14 point system review is negative.  PE BP 128/86   Pulse 64   Ht 5\' 8"  (1.727 m)  Wt 196 lb 9.6 oz (89.2 kg)   SpO2 96%   BMI 29.89 kg/m    Repeat blood pressure by me was 140/80  Wt Readings from Last 3 Encounters:  01/11/23 196 lb 9.6 oz (89.2 kg)  12/28/22 199 lb (90.3 kg)  11/30/22 201 lb (91.2 kg)   General: Alert, oriented, no distress.  Skin: normal turgor, no rashes, warm and dry HEENT: Normocephalic, atraumatic. Pupils equal round and reactive to light; sclera anicteric; extraocular muscles intact;  Nose without nasal septal hypertrophy Mouth/Parynx benign; Mallinpatti scale Neck: No JVD, no carotid bruits; normal carotid upstroke Lungs: clear to ausculatation and percussion; no wheezing or rales Chest wall: without tenderness to palpitation Heart: PMI not displaced, RRR, s1 s2 normal, 126 systolic murmur in the aortic area, no diastolic murmur, no rubs, gallops,  thrills, or heaves Abdomen: soft, nontender; no hepatosplenomehaly, BS+; abdominal aorta nontender and not dilated by palpation. Back: no CVA tenderness Pulses 2+ Musculoskeletal: full range of motion, normal strength, no joint deformities Extremities: no clubbing cyanosis or edema, Homan's sign negative  Neurologic: grossly nonfocal; Cranial nerves grossly wnl Psychologic: Normal mood and affect     EKG Interpretation Date/Time:  Tuesday January 11 2023 09:31:08 EST Ventricular Rate:  65 PR Interval:  162 QRS Duration:  154 QT Interval:  456 QTC Calculation: 474 R Axis:   19  Text Interpretation: Normal sinus rhythm Right bundle branch block When compared with ECG of 19-Oct-2022 11:23, Premature atrial complexes are no longer Present Confirmed by Nicki Guadalajara ([id number]) on 01/11/2023 9:48:29 AM    December 09, 2021 ECG (independently read by me):  Sinus rhythm at 61, PVC, RBBB; QTc 469 msec  November 19, 2020: ECG (independently read by me): Sinus bradycardia 52 bpm, right bundle branch block.  No ectopy  I personally reviewed the ECG from May 16, 2019 which revealed sinus bradycardia at 56 bpm, right bundle branch block and inferior T wave abnormality.  August 2018 ECG (independently read by me): Sinus bradycardia at 54 bpm.  Right bundle-branch block with repolarization changes.  Normal intervals.  LABS:     Latest Ref Rng & Units 10/19/2022   11:41 AM 10/18/2022    9:28 AM 07/30/2022    8:00 AM  BMP  Glucose 70 - 99 mg/dL 469  629  528   BUN 8 - 23 mg/dL 20  18  29    Creatinine 0.61 - 1.24 mg/dL 4.13  2.44  0.10   BUN/Creat Ratio 6 - 22 (calc)   14   Sodium 135 - 145 mmol/L 139  139  140   Potassium 3.5 - 5.1 mmol/L 3.6  3.4  4.3   Chloride 98 - 111 mmol/L 105  109  109   CO2 22 - 32 mmol/L 22  21  20    Calcium 8.9 - 10.3 mg/dL 8.7  8.6  9.5        Latest Ref Rng & Units 07/30/2022    8:00 AM 10/05/2021   11:48 PM 04/30/2021    8:23 AM  Hepatic Function  Total Protein  6.1 - 8.1 g/dL 6.6  5.9  6.0   Albumin 3.5 - 5.0 g/dL  3.4    AST 10 - 35 U/L 29  28  18    ALT 9 - 46 U/L 21  27  13    Alk Phosphatase 38 - 126 U/L  78    Total Bilirubin 0.2 - 1.2 mg/dL 0.4  0.3  0.3  Latest Ref Rng & Units 10/19/2022   11:41 AM 10/18/2022    9:28 AM 07/30/2022    8:00 AM  CBC  WBC 4.0 - 10.5 K/uL 6.5  5.9  5.5   Hemoglobin 13.0 - 17.0 g/dL 16.1  09.6  04.5   Hematocrit 39.0 - 52.0 % 38.0  37.4  39.2   Platelets 150 - 400 K/uL 193  185  192     Lab Results  Component Value Date   MCV 84.3 10/19/2022   MCV 85.0 10/18/2022   MCV 85.4 07/30/2022    Lab Results  Component Value Date   TSH 1.740 10/02/2020   Lab Results  Component Value Date   HGBA1C 6.5 (A) 12/28/2022   Lipid Panel     Component Value Date/Time   CHOL 143 07/30/2022 0800   CHOL 140 12/25/2020 0742   TRIG 92 07/30/2022 0800   HDL 59 07/30/2022 0800   HDL 49 12/25/2020 0742   CHOLHDL 2.4 07/30/2022 0800   VLDL 37.8 04/22/2022 1133   LDLCALC 66 07/30/2022 0800    RADIOLOGY: No results found.  IMPRESSION:  1. Hyperlipidemia LDL goal <70   2. OSA on CPAP   3. Essential hypertension   4. RBBB   5. 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 CAD (coronary artery disease), with 60-70% stenosis in RCA   7. Nonrheumatic aortic valve stenosis     ASSESSMENT AND PLAN: Mr. Sicotte is a 77 year-old African-American male who has a history of obstructive sleep apnea for over 20 years.  He never used CPAP until 2014 and commenced after his evaluation at [facility], which revealed severe obstructive sleep apnea with an AHI of 69.3/h and during REM sleep 71.5 per hour.  At that time he had significantly loud snoring, frequent nocturia, and oxygen desaturation.   His initial CPAP machine was a Forensic [employer].  When I  saw him in 2018, AHI was 1.4 at 18 cm water pressure.  I have not seen him in close to 3 years.  He   continued to use CPAP with excellent compliance and admits to almost 100% compliance with a rare instance where he has not used the machine.  I discussed with him optimal sleep duration of at least 7 to 8 hours if at all possible.  His old machine from 2013 started to malfunction.   When I saw him in May 2021 he qualified for a new machine and I recommended he  obtain a new ResMed AirSense 10 CPAP auto unit.  [employer] is his DME company who has bought out [facility].  When I saw him in August 2021 a download was obtained on his new machine which initially was set at a minimum pressure of 5 and maximum pressure of 20 cm.  He is 100% compliant with average use is at 6 hours and 41 minutes.  AHI was 4.0.  There was no mask leak.  I at that time, recommended increasing his minimum pressure to start at 8 cm which should improve his AHI.  He was meeting compliance standards.  Over the year, he admits to 100% compliance with CPAP use.  On his most recent download from September 12 through November 18, 2020, he was averaging 6 hours and 44 minutes of CPAP use per night.  At his pressure range of 8 to 20 cm, AHI was 2.5.  At his  October 2022 evaluation minor adjustments were made to his settings to his settings and changed his ramp time from 20 minutes down to 15 minutes, increasing his ramp start pressure to 6 cm of water, increasing EPR to 3, and I changed his pressure range from 10 to 20 cm of water.  This should even further improve his treatment.  Presently, from a sleep perspective he is doing exceptionally well.  His most recent download from November 2 through January 09, 2023 shows 100% use with average use of 7 hours and 28 minutes.  AHI is 2.5.  He is CPAP set at a pressure range of 12 to 20 cm and 95th percentile pressure is 15.6 with maximum average pressure at 17.1.  I will now change his ramp initiating pressure from 6 to 8 cm.  I will change his pressure range to 13 to 20 cm.  [employer] is his new DME  company.  He uses a nasal mask.  His blood pressure today is stable on his medical regimen of amlodipine 10 mg, carvedilol 25 mg twice a day, doxazosin 8 mg twice a day and valsartan 320 mg daily.  He is on [place] for diabetes mellitus.  He is on omega-3 fatty acid and rosuvastatin 40 mg for hyperlipidemia.  He continues to be on an oral Ellipta for his lungs.  ECG today is stable with sinus rhythm at 64 with right bundle branch block.  He will return to the cardiology care of Dr. Duke Salvia.  I will be available from a sleep perspective until I retire and subsequent to that he will need to be transition to Dr. Mayford Knife or another sleep provider.    Lennette Bihari, MD, [facility]  01/12/2023 4:15 PM

## 2023-01-12 ENCOUNTER — Encounter: Payer: Self-pay | Admitting: Cardiovascular Disease

## 2023-01-12 DIAGNOSIS — N1832 Chronic kidney disease, stage 3b: Secondary | ICD-10-CM | POA: Diagnosis not present

## 2023-01-18 DIAGNOSIS — R809 Proteinuria, unspecified: Secondary | ICD-10-CM | POA: Diagnosis not present

## 2023-01-18 DIAGNOSIS — E1122 Type 2 diabetes mellitus with diabetic chronic kidney disease: Secondary | ICD-10-CM | POA: Diagnosis not present

## 2023-01-18 DIAGNOSIS — K219 Gastro-esophageal reflux disease without esophagitis: Secondary | ICD-10-CM | POA: Diagnosis not present

## 2023-01-18 DIAGNOSIS — I272 Pulmonary hypertension, unspecified: Secondary | ICD-10-CM | POA: Diagnosis not present

## 2023-01-18 DIAGNOSIS — E872 Acidosis, unspecified: Secondary | ICD-10-CM | POA: Diagnosis not present

## 2023-01-18 DIAGNOSIS — N1832 Chronic kidney disease, stage 3b: Secondary | ICD-10-CM | POA: Diagnosis not present

## 2023-01-18 DIAGNOSIS — I129 Hypertensive chronic kidney disease with stage 1 through stage 4 chronic kidney disease, or unspecified chronic kidney disease: Secondary | ICD-10-CM | POA: Diagnosis not present

## 2023-01-18 DIAGNOSIS — E1129 Type 2 diabetes mellitus with other diabetic kidney complication: Secondary | ICD-10-CM | POA: Diagnosis not present

## 2023-01-31 ENCOUNTER — Other Ambulatory Visit: Payer: Self-pay

## 2023-01-31 NOTE — Telephone Encounter (Signed)
Prescription Request  01/31/2023  LOV: 10/19/22  What is the name of the medication or equipment? doxazosin (CARDURA) 8 MG tablet [[id number]]   Have you contacted your pharmacy to request a refill? Yes   Which pharmacy would you like this sent to?  [id number] [place], [place] - [address] [place] [address] Phone: [phone] Fax: [phone]    Patient notified that their request is being sent to the clinical staff for review and that they should receive a response within 2 business days.   Please advise at [facility] [phone]

## 2023-02-01 NOTE — Telephone Encounter (Signed)
Requested Prescriptions  Pending Prescriptions Disp Refills   doxazosin (CARDURA) 8 MG tablet 180 tablet 3    Sig: Take 1 tablet (8 mg total) by mouth 2 (two) times daily.     Cardiovascular:  Alpha Blockers Failed - 02/01/2023  1:26 PM      Failed - Valid encounter within last 6 months    Recent Outpatient Visits           1 year ago Benign essential HTN   [facility] Tanya Nones, Priscille Heidelberg, MD   2 years ago Benign essential HTN   [facility] Tanya Nones, Priscille Heidelberg, MD   2 years ago Urinary frequency   [facility] Tanya Nones, Priscille Heidelberg, MD   2 years ago General medical exam   [facility] Donita Brooks, MD   3 years ago Left sided sciatica   [facility] Donita Brooks, MD              Passed - Last BP in normal range    BP Readings from Last 1 Encounters:  01/11/23 128/86

## 2023-02-03 NOTE — Telephone Encounter (Signed)
Pharmacy sent script to follow up on refill requested for   doxazosin (CARDURA) 8 MG tablet   LOV: 10/19/2022   Pharmacy:   [id number] [place], [place] - [address] [place], [place] [address] Phone: [phone]  Fax: [phone] DEA #: [id number]

## 2023-02-17 ENCOUNTER — Other Ambulatory Visit: Payer: Self-pay | Admitting: Family

## 2023-02-17 ENCOUNTER — Other Ambulatory Visit: Payer: Self-pay | Admitting: Family Medicine

## 2023-02-17 DIAGNOSIS — R079 Chest pain, unspecified: Secondary | ICD-10-CM

## 2023-02-17 DIAGNOSIS — I10 Essential (primary) hypertension: Secondary | ICD-10-CM

## 2023-02-18 ENCOUNTER — Other Ambulatory Visit: Payer: Self-pay

## 2023-02-18 DIAGNOSIS — I2510 Atherosclerotic heart disease of native coronary artery without angina pectoris: Secondary | ICD-10-CM

## 2023-02-18 DIAGNOSIS — I10 Essential (primary) hypertension: Secondary | ICD-10-CM

## 2023-02-18 NOTE — Telephone Encounter (Signed)
 Prescription Request  02/18/2023  LOV: 10/19/22  What is the name of the medication or equipment? omeprazole  (PRILOSEC) 20 MG capsule [[id number]]  DISCONTINUED   Have you contacted your pharmacy to request a refill? No   Which pharmacy would you like this sent to?  [id number] [place], [place] - [address] [place] [address] Phone: [phone] Fax: [phone]    Patient notified that their request is being sent to the clinical staff for review and that they should receive a response within 2 business days.   Please advise at [facility] [phone]

## 2023-02-18 NOTE — Telephone Encounter (Signed)
 Prescription Request  02/18/2023  LOV: 10/19/22  What is the name of the medication or equipment? carvedilol  (COREG ) 25 MG tablet [[id number]]   Have you contacted your pharmacy to request a refill? No   Which pharmacy would you like this sent to?  [id number] [place], [place] - [address]  [place] [address] Phone: [phone] Fax: [phone]    Patient notified that their request is being sent to the clinical staff for review and that they should receive a response within 2 business days.   Please advise at [facility] [phone]

## 2023-02-21 ENCOUNTER — Other Ambulatory Visit (HOSPITAL_BASED_OUTPATIENT_CLINIC_OR_DEPARTMENT_OTHER): Payer: Self-pay | Admitting: Family

## 2023-02-21 MED ORDER — CARVEDILOL 25 MG PO TABS: 25.0000 mg | ORAL_TABLET | Freq: Two times a day (BID) | ORAL | 0 refills | Status: DC

## 2023-02-21 NOTE — Telephone Encounter (Signed)
 Requested Prescriptions  Pending Prescriptions Disp Refills   valsartan  (DIOVAN ) 320 MG tablet [Pharmacy Med Name: VALSARTAN  320 MG TABLET] 90 tablet 0    Sig: TAKE 1 TABLET BY MOUTH EVERY DAY     Cardiovascular:  Angiotensin Receptor Blockers Failed - 02/21/2023 12:40 PM      Failed - Cr in normal range and within 180 days    Creat  Date Value Ref Range Status  07/30/2022 2.04 (H) 0.70 - 1.28 mg/dL Final   Creatinine, Ser  Date Value Ref Range Status  10/19/2022 2.11 (H) 0.61 - 1.24 mg/dL Final   Creatinine, Urine  Date Value Ref Range Status  07/30/2022 52 20 - 320 mg/dL Final         Failed - Valid encounter within last 6 months    Recent Outpatient Visits           1 year ago Benign essential HTN   [facility] Duanne Jabbour DASEN, MD   2 years ago Benign essential HTN   [facility] Duanne, Schinke DASEN, MD   2 years ago Urinary frequency   [facility] Duanne, Bacha DASEN, MD   2 years ago General medical exam   [facility] Duanne Searson DASEN, MD   3 years ago Left sided sciatica   [facility] Duanne Fonte DASEN, MD              Passed - K in normal range and within 180 days    Potassium  Date Value Ref Range Status  10/19/2022 3.6 3.5 - 5.1 mmol/L Final         Passed - Patient is not pregnant      Passed - Last BP in normal range    BP Readings from Last 1 Encounters:  01/11/23 128/86

## 2023-02-21 NOTE — Telephone Encounter (Signed)
 Requested Prescriptions  Pending Prescriptions Disp Refills   carvedilol  (COREG ) 25 MG tablet 180 tablet 0    Sig: Take 1 tablet (25 mg total) by mouth 2 (two) times daily.     Cardiovascular: Beta Blockers 3 Failed - 02/21/2023  3:17 PM      Failed - Cr in normal range and within 360 days    Creat  Date Value Ref Range Status  07/30/2022 2.04 (H) 0.70 - 1.28 mg/dL Final   Creatinine, Ser  Date Value Ref Range Status  10/19/2022 2.11 (H) 0.61 - 1.24 mg/dL Final   Creatinine, Urine  Date Value Ref Range Status  07/30/2022 52 20 - 320 mg/dL Final         Failed - Valid encounter within last 6 months    Recent Outpatient Visits           1 year ago Benign essential HTN   [facility] Duanne Tomb DASEN, MD   2 years ago Benign essential HTN   [facility] Pickard, Stitzer DASEN, MD   2 years ago Urinary frequency   [facility] Pickard, Swindle DASEN, MD   2 years ago General medical exam   [facility] Duanne Joung DASEN, MD   3 years ago Left sided sciatica   [facility] Pickard, Decock DASEN, MD              Passed - AST in normal range and within 360 days    AST  Date Value Ref Range Status  07/30/2022 29 10 - 35 U/L Final         Passed - ALT in normal range and within 360 days    ALT  Date Value Ref Range Status  07/30/2022 21 9 - 46 U/L Final         Passed - Last BP in normal range    BP Readings from Last 1 Encounters:  01/11/23 128/86         Passed - Last Heart Rate in normal range    Pulse Readings from Last 1 Encounters:  01/11/23 64

## 2023-02-22 ENCOUNTER — Ambulatory Visit: Payer: Medicare Other | Admitting: Family Medicine

## 2023-02-22 ENCOUNTER — Ambulatory Visit (HOSPITAL_COMMUNITY)
Admission: RE | Admit: 2023-02-22 | Discharge: 2023-02-22 | Disposition: A | Discharge status: Home / Self Care | Payer: Medicare Other | Source: Ambulatory Visit | Attending: Family Medicine | Admitting: Family Medicine

## 2023-02-22 ENCOUNTER — Other Ambulatory Visit: Payer: Self-pay | Admitting: Family Medicine

## 2023-02-22 ENCOUNTER — Encounter: Payer: Self-pay | Admitting: Family Medicine

## 2023-02-22 VITALS — BP 140/86 | HR 60 | Temp 98.1°F | Ht 68.0 in | Wt 201.0 lb

## 2023-02-22 DIAGNOSIS — M5414 Radiculopathy, thoracic region: Secondary | ICD-10-CM

## 2023-02-22 DIAGNOSIS — M47814 Spondylosis without myelopathy or radiculopathy, thoracic region: Secondary | ICD-10-CM | POA: Diagnosis not present

## 2023-02-22 DIAGNOSIS — M546 Pain in thoracic spine: Secondary | ICD-10-CM | POA: Diagnosis not present

## 2023-02-22 DIAGNOSIS — R0789 Other chest pain: Secondary | ICD-10-CM | POA: Diagnosis not present

## 2023-02-22 DIAGNOSIS — M419 Scoliosis, unspecified: Secondary | ICD-10-CM | POA: Diagnosis not present

## 2023-02-22 NOTE — Progress Notes (Signed)
 Subjective:    Patient ID: James Jones, male    DOB: 06-27-1945, 78 y.o.   MRN: [id number]  HPI Patient reports 3 to 4 weeks of pain.  The pain starts near his shoulder blade and then radiates around through his mid axillary line into his right chest just below his right nipple.  It stings and burns.  He describes it as a stinging needlelike pain.  There is no specific cause.  He denies any falls or injuries.  It does not hurt to take a deep breath then.  He denies any cough.  He denies any hemoptysis.  There is no tenderness to palpation on the ribs.  Simply touching skin does not elicit any pain.  There is no erythema or rash to suggest shingles.  The patient denies any fevers or chills.   Past Medical History:  Diagnosis Date   Aortic stenosis 04/02/2021   Chronic diastolic heart failure (HCC)    CKD (chronic kidney disease) stage 2, GFR 60-89 ml/min 03/17/2012   Coronary artery disease, non-occlusive 03/18/2012   60-70% stenosis in RCA -- FFR 0.84; EF 60-65%   Diabetes mellitus without complication (HCC)    GERD (gastroesophageal reflux disease)    On PPI   History of GI bleed 02/2013   No obvious findings on EGD/colonoscopy   Hydrocele, bilateral    large on US  2020   Hyperlipidemia    Hypertension    Obesity (BMI 30.0-34.9) 10/02/2012   OSA (obstructive sleep apnea) 03/18/2012   Doing better with CPAP   Pulmonary hypertension (HCC) 12/10/2010   ECHO:  Mild PH,mild LVH; PA pressures estimated 30-40 mmHg   Pulmonary hypertension, unspecified (HCC) 06/04/2021   RBBB, 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   Event monitor; usually during sleeping hours; therefore not on beta blocker   Past Surgical History:  Procedure Laterality Date   APPENDECTOMY  1974   CARPAL TUNNEL RELEASE     EYE SURGERY  1960   Left eye   HEMORRHOID SURGERY     LEFT HEART CATH AND CORONARY ANGIOGRAPHY  03/27/2010   Moderate mid RCA lesion,right radial approach,normal EF   LEFT HEART  CATHETERIZATION WITH CORONARY ANGIOGRAM N/A 03/17/2012   Procedure: LEFT HEART CATHETERIZATION WITH CORONARY ANGIOGRAM;  Surgeon: Alm LELON Clay, MD;  Location: [facility] CATH LAB;  Service: Cardiovascular: Fractional Flow Reserve Measurement of mid RCA 60-70% stenosis = 0.84. Otherwise mild LCA CAD.  Normal EF & EDP   SHOULDER ARTHROSCOPY  2006   TRANSTHORACIC ECHOCARDIOGRAM  12/2010    Mild concentric LVH.  EF> 55%.  GR 1 DD.  Mild aortic sclerosis no stenosis.  PA pressures estimated 30-40 mmHg   Current Outpatient Medications on File Prior to Visit  Medication Sig Dispense Refill   albuterol  (VENTOLIN  HFA) 108 (90 Base) MCG/ACT inhaler Inhale 2 puffs into the lungs every 6 (six) hours as needed for wheezing or shortness of breath. 8 g 6   allopurinol  (ZYLOPRIM ) 100 MG tablet Take 1 tablet (100 mg total) by mouth daily. 60 tablet 1   amLODipine  (NORVASC ) 10 MG tablet Take 10 mg by mouth daily.     aspirin  EC 81 MG tablet Take 1 tablet (81 mg total) by mouth daily. Swallow whole. 90 tablet 3   Blood Glucose Monitoring Suppl (FREESTYLE LITE) DEVI Use to check blood sugar once daily 1 each 0   carvedilol  (COREG ) 25 MG tablet Take 1 tablet (25 mg total) by mouth 2 (two) times daily. 180 tablet 0  Cholecalciferol (VITAMIN D-3) 1000 UNITS CAPS Take by mouth daily. 2 capsules.     dapagliflozin  propanediol (FARXIGA ) 10 MG TABS tablet Take 1 tablet (10 mg total) by mouth daily before breakfast. 90 tablet 3   doxazosin  (CARDURA ) 8 MG tablet TAKE 1 TABLET (8 MG TOTAL) BY MOUTH 2 (TWO) TIMES DAILY. 180 tablet 1   famotidine  (PEPCID ) 20 MG tablet TAKE 1 TABLET BY MOUTH EVERYDAY AT BEDTIME 90 tablet 1   ferrous sulfate  325 (65 FE) MG tablet Take 1 tablet (325 mg total) by mouth 2 (two) times daily with a meal. 180 tablet 3   fish oil-omega-3 fatty acids  1000 MG capsule Take 1,200 mg by mouth daily.      FREESTYLE LITE test strip USE TO CHECK BLOOD SUGAR 1-2X DAILY. E11.59 100 strip 1   Multiple Vitamin  (MULTIVITAMIN WITH MINERALS) TABS Take 1 tablet by mouth daily.     nitroGLYCERIN  (NITROSTAT ) 0.4 MG SL tablet Place 1 tablet (0.4 mg total) under the tongue every 5 (five) minutes as needed for chest pain. 25 tablet 4   pantoprazole  (PROTONIX ) 40 MG tablet TAKE 1 TABLET BY MOUTH EVERY DAY 90 tablet 1   PRESCRIPTION MEDICATION Uses C-PAP at bedtime     rosuvastatin  (CRESTOR ) 40 MG tablet TAKE 1 TABLET BY MOUTH EVERY DAY 90 tablet 1   sodium bicarbonate 650 MG tablet Take 650 mg by mouth daily.     umeclidinium-vilanterol (ANORO ELLIPTA ) 62.5-25 MCG/ACT AEPB Inhale 1 puff into the lungs daily. 7 each 0   valsartan  (DIOVAN ) 320 MG tablet TAKE 1 TABLET BY MOUTH EVERY DAY 90 tablet 0   No current facility-administered medications on file prior to visit.   Allergies  Allergen Reactions   Atorvastatin      myalgias   Social History   Socioeconomic History   Marital status: Married    Spouse name: Not on file   Number of children: Not on file   Years of education: Not on file   Highest education level: Not on file  Occupational History   Not on file  Tobacco Use   Smoking status: Former    Current packs/day: 0.00    Average packs/day: 2.0 packs/day for 50.0 years (100.0 ttl pk-yrs)    Types: Cigarettes    Start date: 10/10/1970    Quit date: 10/09/2020    Years since quitting: 2.3   Smokeless tobacco: Never  Vaping Use   Vaping status: Never Used  Substance and Sexual Activity   Alcohol use: Yes    Alcohol/week: 1.0 standard drink of alcohol    Types: 1 Standard drinks or equivalent per week   Drug use: Yes    Types: Marijuana    Comment: cannibus, occ use   Sexual activity: Not on file  Other Topics Concern   Not on file  Social History Narrative   Married, father of 2, grandfather of 3.   He is a former smoker of about a pack to pack and half cigarettes a day -- he quit in March of this year.    He is an avid exerciser working at least 4-5 days a week doing her walking or  stationary bike.   Social Drivers of Corporate Investment Banker Strain: Low Risk  (04/29/2022)   Overall Financial Resource Strain (CARDIA)    Difficulty of Paying Living Expenses: Not hard at all  Food Insecurity: No Food Insecurity (04/29/2022)   Hunger Vital Sign    Worried About Running Out of  Food in the Last Year: Never true    Ran Out of Food in the Last Year: Never true  Transportation Needs: No Transportation Needs (04/29/2022)   PRAPARE - Administrator, Civil Service (Medical): No    Lack of Transportation (Non-Medical): No  Physical Activity: Sufficiently Active (04/29/2022)   Exercise Vital Sign    Days of Exercise per Week: 5 days    Minutes of Exercise per Session: 40 min  Stress: No Stress Concern Present (04/29/2022)   [employer] of Occupational Health - Occupational Stress Questionnaire    Feeling of Stress : Not at all  Social Connections: Socially Integrated (04/29/2022)   Social Connection and Isolation Panel [NHANES]    Frequency of Communication with Friends and Family: More than three times a week    Frequency of Social Gatherings with Friends and Family: More than three times a week    Attends Religious Services: More than 4 times per year    Active Member of [employer] or Organizations: Yes    Attends Engineer, Structural: More than 4 times per year    Marital Status: Married  Catering Manager Violence: Not At Risk (04/29/2022)   Humiliation, Afraid, Rape, and Kick questionnaire    Fear of Current or Ex-Partner: No    Emotionally Abused: No    Physically Abused: No    Sexually Abused: No   Family History  Problem Relation Age of Onset   Hypertension Mother    Kidney failure Mother    Coronary artery disease Father    Hypertension Sister    Kidney failure Sister    Stroke Maternal Grandmother      Review of Systems  All other systems reviewed and are negative.      Objective:   Physical Exam Vitals reviewed.   Constitutional:      General: He is not in acute distress.    Appearance: He is well-developed. He is not diaphoretic.  HENT:     Head: Normocephalic and atraumatic.     Right Ear: External ear normal.     Left Ear: External ear normal.     Nose: Nose normal.     Mouth/Throat:     Pharynx: No oropharyngeal exudate.  Eyes:     General: No scleral icterus.       Right eye: No discharge.        Left eye: No discharge.     Conjunctiva/sclera: Conjunctivae normal.     Pupils: Pupils are equal, round, and reactive to light.  Neck:     Thyroid : No thyromegaly.     Vascular: No JVD.     Trachea: No tracheal deviation.  Cardiovascular:     Rate and Rhythm: Normal rate and regular rhythm.     Heart sounds: Murmur heard.     No friction rub. No gallop.  Pulmonary:     Effort: Pulmonary effort is normal. No respiratory distress.     Breath sounds: Normal breath sounds. No stridor. No wheezing or rales.    Chest:     Chest wall: No tenderness.    Abdominal:     General: Bowel sounds are normal. There is no distension.     Palpations: Abdomen is soft. There is no mass.     Tenderness: There is no abdominal tenderness. There is no guarding or rebound.  Musculoskeletal:        General: No tenderness or deformity. Normal range of motion.     Cervical  back: Normal range of motion and neck supple.  Lymphadenopathy:     Cervical: No cervical adenopathy.  Skin:    General: Skin is warm.     Coloration: Skin is not pale.     Findings: No erythema or rash.  Neurological:     Mental Status: He is alert and oriented to person, place, and time.     Cranial Nerves: No cranial nerve deficit.     Motor: No abnormal muscle tone.     Coordination: Coordination normal.     Deep Tendon Reflexes: Reflexes are normal and symmetric.  Psychiatric:        Behavior: Behavior normal.        Thought Content: Thought content normal.        Judgment: Judgment normal.           Assessment &  Plan:  Thoracic radiculopathy - Plan: DG Thoracic Spine W/Swimmers, DG Chest 2 View Symptoms sound consistent with thoracic radiculopathy.  There is no evidence of shingles.  I suspect the degenerative disc could be pressing on the nerve.  Recommend getting x-rays of the thoracic spine.  Also recommended getting a chest x-ray.  There are no specific causes or exacerbating or any factors.  Patient states the pain is worse at night when he is lying down.

## 2023-02-22 NOTE — Telephone Encounter (Signed)
 Prescription Request  02/22/2023  LOV: 02/22/2023  What is the name of the medication or equipment?   omeprazole  (PRILOSEC) 20 MG capsule [[id number]]  DISCONTINUED   **90 day script requested**  Have you contacted your pharmacy to request a refill? Yes   Which pharmacy would you like this sent to?  [id number] [place], [place] - [address] [place]  [address] Phone: [phone] Fax: [phone]    Patient notified that their request is being sent to the clinical staff for review and that they should receive a response within 2 business days.   Please advise pharmacist.

## 2023-02-23 NOTE — Telephone Encounter (Signed)
 Please call patient to verify medication requested if med is prilosec or protonix ?

## 2023-02-23 NOTE — Telephone Encounter (Signed)
 D/C 10/06/21. Requested Prescriptions  Refused Prescriptions Disp Refills   omeprazole  (PRILOSEC) 20 MG capsule 90 capsule 3    Sig: Take 1 capsule (20 mg total) by mouth daily.     Gastroenterology: Proton Pump Inhibitors Failed - 02/23/2023 12:55 PM      Failed - Valid encounter within last 12 months    Recent Outpatient Visits           1 year ago Benign essential HTN   [facility] Cheril Cork Cisco Crest, MD   2 years ago Benign essential HTN   [facility] Cheril Cork, Cisco Crest, MD   2 years ago Urinary frequency   [facility] Cheril Cork, Cisco Crest, MD   2 years ago General medical exam   [facility] Austine Lefort, MD   3 years ago Left sided sciatica   [facility] Pickard, Cisco Crest, MD

## 2023-02-24 ENCOUNTER — Other Ambulatory Visit: Payer: Self-pay | Admitting: Family Medicine

## 2023-02-24 MED ORDER — PREDNISONE 20 MG PO TABS: ORAL_TABLET | ORAL | 0 refills | Status: DC

## 2023-02-25 ENCOUNTER — Ambulatory Visit (INDEPENDENT_AMBULATORY_CARE_PROVIDER_SITE_OTHER): Payer: Medicare Other

## 2023-02-25 ENCOUNTER — Encounter (HOSPITAL_BASED_OUTPATIENT_CLINIC_OR_DEPARTMENT_OTHER): Payer: Self-pay

## 2023-02-25 DIAGNOSIS — I350 Nonrheumatic aortic (valve) stenosis: Secondary | ICD-10-CM

## 2023-02-25 LAB — ECHOCARDIOGRAM COMPLETE
AR max vel: 1.63 cm2
AV Area VTI: 1.7 cm2
AV Area mean vel: 1.57 cm2
AV Mean grad: 11 mm[Hg]
AV Peak grad: 21.5 mm[Hg]
Ao pk vel: 2.32 m/s
Area-P 1/2: 4.19 cm2
S' Lateral: 3 cm

## 2023-02-28 ENCOUNTER — Telehealth (HOSPITAL_BASED_OUTPATIENT_CLINIC_OR_DEPARTMENT_OTHER): Payer: Self-pay

## 2023-02-28 DIAGNOSIS — I350 Nonrheumatic aortic (valve) stenosis: Secondary | ICD-10-CM

## 2023-02-28 NOTE — Telephone Encounter (Signed)
-----   Message from Alver Sorrow sent at 02/25/2023  5:09 PM EST ----- Stable mild to moderate aortic stenosis.  Recommend repeat echocardiogram in 1 year for monitoring.

## 2023-03-10 ENCOUNTER — Other Ambulatory Visit: Payer: Self-pay | Admitting: Family Medicine

## 2023-03-10 NOTE — Telephone Encounter (Signed)
Prescription Request  03/10/2023  LOV: 02/22/2023  What is the name of the medication or equipment?   amLODipine (NORVASC) 10 MG tablet  **original script was for 90 days**  Have you contacted your pharmacy to request a refill? Yes   Which pharmacy would you like this sent to?  [id number] [place], [place] - [address] [place] [address] Phone: [phone] Fax: [phone]    Patient notified that their request is being sent to the clinical staff for review and that they should receive a response within 2 business days.   Please advise pharmacist.

## 2023-03-11 NOTE — Telephone Encounter (Signed)
Requested medications are due for refill today.  unsure  Requested medications are on the active medications list.  yes  Last refill. 01/11/2023  Future visit scheduled.   no  Notes to clinic.  Medication is historical    Requested Prescriptions  Pending Prescriptions Disp Refills   amLODipine (NORVASC) 10 MG tablet      Sig: Take 1 tablet (10 mg total) by mouth daily.     Cardiovascular: Calcium Channel Blockers 2 Failed - 03/11/2023 11:04 AM      Failed - Last BP in normal range    BP Readings from Last 1 Encounters:  02/22/23 (!) 140/86         Failed - Valid encounter within last 6 months    Recent Outpatient Visits           1 year ago Benign essential HTN   [facility] Donita Brooks, MD   2 years ago Benign essential HTN   [facility] Tanya Nones, Priscille Heidelberg, MD   2 years ago Urinary frequency   [facility] Tanya Nones, Priscille Heidelberg, MD   2 years ago General medical exam   [facility] Donita Brooks, MD   3 years ago Left sided sciatica   [facility] Donita Brooks, MD              Passed - Last Heart Rate in normal range    Pulse Readings from Last 1 Encounters:  02/22/23 60

## 2023-03-14 ENCOUNTER — Other Ambulatory Visit: Payer: Self-pay | Admitting: Family Medicine

## 2023-03-14 ENCOUNTER — Telehealth: Payer: Self-pay

## 2023-03-14 DIAGNOSIS — M109 Gout, unspecified: Secondary | ICD-10-CM

## 2023-03-14 MED ORDER — ALLOPURINOL 100 MG PO TABS: 100.0000 mg | ORAL_TABLET | Freq: Every day | ORAL | 1 refills | Status: DC

## 2023-03-14 MED ORDER — AMLODIPINE BESYLATE 10 MG PO TABS: 10.0000 mg | ORAL_TABLET | Freq: Every day | ORAL | 1 refills | Status: DC

## 2023-03-14 NOTE — Telephone Encounter (Signed)
Per Dr. 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  Would try prednisone taper pack.   Copied from CRM [id number]. Topic: Clinical - Lab/Test Results >> Mar 14, 2023 11:15 AM Antony Haste wrote: Reason for CRM: This patient is wanting to review his chest x-ray results with his PCP. Callback #[phone]

## 2023-03-14 NOTE — Telephone Encounter (Signed)
Copied from CRM [id number]. Topic: Clinical - Prescription Issue >> Mar 14, 2023 11:12 AM Antony Haste wrote: Reason for CRM: The patient attempted to refill dapagliflozin propanediol (FARXIGA) 10 MG TABS tablet informed it will cost $880, he is needing an alternative sent in, if possible. allopurinol (ZYLOPRIM) 100 MG tablet [facility]. Callback #: [phone]

## 2023-03-14 NOTE — Telephone Encounter (Signed)
Pharmacy sent eFax to follow up on 90 day refill requested for amLODipine (NORVASC) 10 MG tablet   LOV 02/22/2023  Pharmacy:  [id number] [place], [place] - [address] [place], [place] [address] Phone: [phone]  Fax: [phone] DEA #: [id number]    Please advise pharmacist.

## 2023-03-15 ENCOUNTER — Other Ambulatory Visit: Payer: Self-pay

## 2023-03-15 DIAGNOSIS — E1122 Type 2 diabetes mellitus with diabetic chronic kidney disease: Secondary | ICD-10-CM

## 2023-03-15 DIAGNOSIS — N183 Chronic kidney disease, stage 3 unspecified: Secondary | ICD-10-CM

## 2023-03-15 DIAGNOSIS — E1159 Type 2 diabetes mellitus with other circulatory complications: Secondary | ICD-10-CM

## 2023-03-15 NOTE — Progress Notes (Signed)
 ref

## 2023-03-16 ENCOUNTER — Telehealth: Payer: Self-pay

## 2023-03-16 NOTE — Progress Notes (Signed)
 Care Guide Pharmacy Note  03/16/2023 Name: James Jones MRN: [id number] DOB: 10/27/1945  Referred By: Duanne Moragne DASEN, MD Reason for referral: Care Coordination (Outreach to schedule with Pharm d )   James Jones is a 78 y.o. year old male who is a primary care patient of Duanne Lesniak DASEN, MD.  James Jones was referred to the pharmacist for assistance related to: DMII  Successful contact was made with the patient to discuss pharmacy services including being ready for the pharmacist to call at least 5 minutes before the scheduled appointment time and to have medication bottles and any blood pressure readings ready for review. The patient agreed to meet with the pharmacist via telephone visit on (date/time).04/20/2023  James Jones , RMA     [place]  [facility] [place], [facility] Guide  Direct Dial: [phone]  Website: [email or web address]

## 2023-03-17 ENCOUNTER — Other Ambulatory Visit: Payer: Self-pay | Admitting: Family Medicine

## 2023-03-17 DIAGNOSIS — M5414 Radiculopathy, thoracic region: Secondary | ICD-10-CM

## 2023-03-30 ENCOUNTER — Other Ambulatory Visit: Payer: Medicare Other

## 2023-03-30 ENCOUNTER — Ambulatory Visit
Admission: RE | Admit: 2023-03-30 | Discharge: 2023-03-30 | Discharge status: Home / Self Care | Payer: Medicare Other | Source: Ambulatory Visit | Attending: Family Medicine | Admitting: Family Medicine

## 2023-03-30 DIAGNOSIS — M47814 Spondylosis without myelopathy or radiculopathy, thoracic region: Secondary | ICD-10-CM | POA: Diagnosis not present

## 2023-03-30 DIAGNOSIS — M5414 Radiculopathy, thoracic region: Secondary | ICD-10-CM

## 2023-03-31 DIAGNOSIS — H02832 Dermatochalasis of right lower eyelid: Secondary | ICD-10-CM | POA: Diagnosis not present

## 2023-03-31 DIAGNOSIS — H02831 Dermatochalasis of right upper eyelid: Secondary | ICD-10-CM | POA: Diagnosis not present

## 2023-03-31 DIAGNOSIS — H0289 Other specified disorders of eyelid: Secondary | ICD-10-CM | POA: Diagnosis not present

## 2023-04-12 ENCOUNTER — Encounter: Payer: Self-pay | Admitting: Family Medicine

## 2023-04-20 ENCOUNTER — Telehealth: Payer: Self-pay | Admitting: Pharmacist

## 2023-04-20 ENCOUNTER — Other Ambulatory Visit: Payer: Self-pay | Admitting: Pharmacist

## 2023-04-20 NOTE — Telephone Encounter (Signed)
   Patient needs to enroll in the [place] patient assistance program for Farxiga.  Please fax PCP portion to PCP office for sign off.  Message sent to PCP to escribe to [facility] mail order (pharmacy for AZ&me patient assistance).  Patient is stable on current regimen.  Instructed him to look out for a [place].    Kieth Brightly, PharmD, BCACP, CPP Clinical Pharmacist, [facility]

## 2023-04-21 ENCOUNTER — Encounter: Payer: Self-pay | Admitting: Family Medicine

## 2023-04-21 ENCOUNTER — Ambulatory Visit (INDEPENDENT_AMBULATORY_CARE_PROVIDER_SITE_OTHER): Admitting: Family Medicine

## 2023-04-21 ENCOUNTER — Other Ambulatory Visit: Payer: Self-pay | Admitting: Family Medicine

## 2023-04-21 VITALS — BP 126/64 | HR 57 | Temp 97.9°F | Ht 68.0 in | Wt 200.0 lb

## 2023-04-21 DIAGNOSIS — D1721 Benign lipomatous neoplasm of skin and subcutaneous tissue of right arm: Secondary | ICD-10-CM | POA: Diagnosis not present

## 2023-04-21 MED ORDER — DAPAGLIFLOZIN PROPANEDIOL 10 MG PO TABS: 10.0000 mg | ORAL_TABLET | Freq: Every day | ORAL | 4 refills | Status: AC

## 2023-04-21 NOTE — Progress Notes (Signed)
 Subjective:    Patient ID: James Jones, male    DOB: Oct 16, 1945, 78 y.o.   MRN: [id number]  HPI 02/22/23 Patient reports 3 to 4 weeks of pain.  The pain starts near his shoulder blade and then radiates around through his mid axillary line into his right chest just below his right nipple.  It stings and burns.  He describes it as a stinging needlelike pain.  There is no specific cause.  He denies any falls or injuries.  It does not hurt to take a deep breath then.  He denies any cough.  He denies any hemoptysis.  There is no tenderness to palpation on the ribs.  Simply touching skin does not elicit any pain.  There is no erythema or rash to suggest shingles.  The patient denies any fevers or chills.  At that time, my plan was: Symptoms sound consistent with thoracic radiculopathy.  There is no evidence of shingles.  I suspect the degenerative disc could be pressing on the nerve.  Recommend getting x-rays of the thoracic spine.  Also recommended getting a chest x-ray.  There are no specific causes or exacerbating or any factors.  Patient states the pain is worse at night when he is lying down.    04/21/23 Recently had mri: Generalized bulky spondylitic spurring especially at T1-2 and T8-9. Multiple osteophytes cause thoracic ankylosis. Small superimposed disc protrusions at T6-7 and T8-9. At T8-9 there is a left paracentral protrusion with endplate and facet spurring causing foraminal impingement.   IMPRESSION: 1. Bulky spondylitic spurring with multilevel ankylosis. 2. Focal foraminal impingement at T8-9 on the left, and open level by chest CT in 2024, with degeneration. The spinal canal is widely patent.  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  Patient however now is complaining of diffuse back pain thoracic and lumbar spine.  I believe that this is more likely arthritic.  However his biggest problem seems to be focal  pins-and-needles pain within a lipoma in his posterior right axilla just lateral to his scapula.  The lipoma is soft and spongy.  It is poorly circumscribed.  There is no palpable calcifications or hard mass.  However he states that only within that lipoma he has pins-and-needles pain.  Lipoma is approximately 4 cm in diameter Past Medical History:  Diagnosis Date   Aortic stenosis 04/02/2021   Chronic diastolic heart failure (HCC)    CKD (chronic kidney disease) stage 2, GFR 60-89 ml/min 03/17/2012   Coronary artery disease, non-occlusive 03/18/2012   60-70% stenosis in RCA -- FFR 0.84; EF 60-65%   Diabetes mellitus without complication (HCC)    GERD (gastroesophageal reflux disease)    On PPI   History of GI bleed 02/2013   No obvious findings on EGD/colonoscopy   Hydrocele, bilateral    large on [place] 2020   Hyperlipidemia    Hypertension    Obesity (BMI 30.0-34.9) 10/02/2012   OSA (obstructive sleep apnea) 03/18/2012   Doing better with CPAP   Pulmonary hypertension (HCC) 12/10/2010   ECHO:  Mild PH,mild LVH; PA pressures estimated 30-40 mmHg   Pulmonary hypertension, unspecified (HCC) 06/04/2021   RBBB, 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   Event monitor; usually during sleeping hours; therefore not on beta blocker   Past Surgical History:  Procedure Laterality Date   APPENDECTOMY  1974   CARPAL TUNNEL RELEASE     EYE SURGERY  1960   Left  eye   HEMORRHOID SURGERY     LEFT HEART CATH AND CORONARY ANGIOGRAPHY  03/27/2010   Moderate mid RCA lesion,right radial approach,normal EF   LEFT HEART CATHETERIZATION WITH CORONARY ANGIOGRAM N/A 03/17/2012   Procedure: LEFT HEART CATHETERIZATION WITH CORONARY ANGIOGRAM;  Surgeon: Marykay Lex, MD;  Location: [facility] CATH LAB;  Service: Cardiovascular: Fractional Flow Reserve Measurement of mid RCA 60-70% stenosis = 0.84. Otherwise mild LCA CAD.  Normal EF & EDP   SHOULDER ARTHROSCOPY  2006   TRANSTHORACIC ECHOCARDIOGRAM  12/2010     Mild concentric LVH.  EF> 55%.  GR 1 DD.  Mild aortic sclerosis no stenosis.  PA pressures estimated 30-40 mmHg   Current Outpatient Medications on File Prior to Visit  Medication Sig Dispense Refill   albuterol (VENTOLIN HFA) 108 (90 Base) MCG/ACT inhaler Inhale 2 puffs into the lungs every 6 (six) hours as needed for wheezing or shortness of breath. 8 g 6   allopurinol (ZYLOPRIM) 100 MG tablet Take 1 tablet (100 mg total) by mouth daily. 60 tablet 1   amLODipine (NORVASC) 10 MG tablet Take 1 tablet (10 mg total) by mouth daily. 90 tablet 1   aspirin EC 81 MG tablet Take 1 tablet (81 mg total) by mouth daily. Swallow whole. 90 tablet 3   Blood Glucose Monitoring Suppl (FREESTYLE LITE) DEVI Use to check blood sugar once daily 1 each 0   carvedilol (COREG) 25 MG tablet Take 1 tablet (25 mg total) by mouth 2 (two) times daily. 180 tablet 0   Cholecalciferol (VITAMIN D-3) 1000 UNITS CAPS Take by mouth daily. 2 capsules.     doxazosin (CARDURA) 8 MG tablet TAKE 1 TABLET (8 MG TOTAL) BY MOUTH 2 (TWO) TIMES DAILY. 180 tablet 1   famotidine (PEPCID) 20 MG tablet TAKE 1 TABLET BY MOUTH EVERYDAY AT BEDTIME 90 tablet 1   ferrous sulfate 325 (65 FE) MG tablet Take 1 tablet (325 mg total) by mouth 2 (two) times daily with a meal. 180 tablet 3   fish oil-omega-3 fatty acids 1000 MG capsule Take 1,200 mg by mouth daily.      FREESTYLE LITE test strip USE TO CHECK BLOOD SUGAR 1-2X DAILY. E11.59 100 strip 1   Multiple Vitamin (MULTIVITAMIN WITH MINERALS) TABS Take 1 tablet by mouth daily.     pantoprazole (PROTONIX) 40 MG tablet TAKE 1 TABLET BY MOUTH EVERY DAY 90 tablet 1   predniSONE (DELTASONE) 20 MG tablet 3 tabs poqday 1-2, 2 tabs poqday 3-4, 1 tab poqday 5-6 12 tablet 0   PRESCRIPTION MEDICATION Uses C-PAP at bedtime     rosuvastatin (CRESTOR) 40 MG tablet TAKE 1 TABLET BY MOUTH EVERY DAY 90 tablet 1   sodium bicarbonate 650 MG tablet Take 650 mg by mouth daily.     umeclidinium-vilanterol (ANORO  ELLIPTA) 62.5-25 MCG/ACT AEPB Inhale 1 puff into the lungs daily. 7 each 0   valsartan (DIOVAN) 320 MG tablet TAKE 1 TABLET BY MOUTH EVERY DAY 90 tablet 0   nitroGLYCERIN (NITROSTAT) 0.4 MG SL tablet Place 1 tablet (0.4 mg total) under the tongue every 5 (five) minutes as needed for chest pain. 25 tablet 4   No current facility-administered medications on file prior to visit.   Allergies  Allergen Reactions   Atorvastatin     myalgias   Social History   Socioeconomic History   Marital status: Married    Spouse name: Not on file   Number of children: Not on file   Years  of education: Not on file   Highest education level: Not on file  Occupational History   Not on file  Tobacco Use   Smoking status: Former    Current packs/day: 0.00    Average packs/day: 2.0 packs/day for 50.0 years (100.0 ttl pk-yrs)    Types: Cigarettes    Start date: 10/10/1970    Quit date: 10/09/2020    Years since quitting: 2.5   Smokeless tobacco: Never  Vaping Use   Vaping status: Never Used  Substance and Sexual Activity   Alcohol use: Yes    Alcohol/week: 1.0 standard drink of alcohol    Types: 1 Standard drinks or equivalent per week   Drug use: Yes    Types: Marijuana    Comment: cannibus, occ use   Sexual activity: Not on file  Other Topics Concern   Not on file  Social History Narrative   Married, father of 2, grandfather of 3.   He is a former smoker of about a pack to pack and half cigarettes a day -- he quit in March of this year.    He is an avid exerciser working at least 4-5 days a week doing her walking or stationary bike.   Social Drivers of Corporate investment banker Strain: Low Risk  (04/20/2023)   Overall Financial Resource Strain (CARDIA)    Difficulty of Paying Living Expenses: Not hard at all  Food Insecurity: No Food Insecurity (04/20/2023)   Hunger Vital Sign    Worried About Running Out of Food in the Last Year: Never true    Ran Out of Food in the Last Year: Never true   Transportation Needs: No Transportation Needs (04/20/2023)   PRAPARE - Administrator, Civil Service (Medical): No    Lack of Transportation (Non-Medical): No  Physical Activity: Sufficiently Active (04/29/2022)   Exercise Vital Sign    Days of Exercise per Week: 5 days    Minutes of Exercise per Session: 40 min  Stress: No Stress Concern Present (04/20/2023)   [employer] of Occupational Health - Occupational Stress Questionnaire    Feeling of Stress : Only a little  Social Connections: Unknown (04/20/2023)   Social Connection and Isolation Panel [NHANES]    Frequency of Communication with Friends and Family: Patient declined    Frequency of Social Gatherings with Friends and Family: Twice a week    Attends Religious Services: More than 4 times per year    Active Member of [employer] or Organizations: Yes    Attends Banker Meetings: Patient declined    Marital Status: Married  Catering manager Violence: Not At Risk (04/29/2022)   Humiliation, Afraid, Rape, and Kick questionnaire    Fear of Current or Ex-Partner: No    Emotionally Abused: No    Physically Abused: No    Sexually Abused: No   Family History  Problem Relation Age of Onset   Hypertension Mother    Kidney failure Mother    Coronary artery disease Father    Hypertension Sister    Kidney failure Sister    Stroke Maternal Grandmother      Review of Systems  All other systems reviewed and are negative.      Objective:   Physical Exam Vitals reviewed.  Constitutional:      General: He is not in acute distress.    Appearance: He is well-developed. He is not diaphoretic.  HENT:     Head: Normocephalic and atraumatic.  Right Ear: External ear normal.     Left Ear: External ear normal.     Nose: Nose normal.     Mouth/Throat:     Pharynx: No oropharyngeal exudate.  Eyes:     General: No scleral icterus.       Right eye: No discharge.        Left eye: No discharge.      Conjunctiva/sclera: Conjunctivae normal.     Pupils: Pupils are equal, round, and reactive to light.  Neck:     Thyroid: No thyromegaly.     Vascular: No JVD.     Trachea: No tracheal deviation.  Cardiovascular:     Rate and Rhythm: Normal rate and regular rhythm.     Heart sounds: Murmur heard.     No friction rub. No gallop.  Pulmonary:     Effort: Pulmonary effort is normal. No respiratory distress.     Breath sounds: Normal breath sounds. No stridor. No wheezing or rales.    Chest:     Chest wall: No tenderness.    Abdominal:     General: Bowel sounds are normal. There is no distension.     Palpations: Abdomen is soft. There is no mass.     Tenderness: There is no abdominal tenderness. There is no guarding or rebound.  Musculoskeletal:        General: No tenderness or deformity. Normal range of motion.     Cervical back: Normal range of motion and neck supple.       Back:  Lymphadenopathy:     Cervical: No cervical adenopathy.  Skin:    General: Skin is warm.     Coloration: Skin is not pale.     Findings: No erythema or rash.  Neurological:     Mental Status: He is alert and oriented to person, place, and time.     Cranial Nerves: No cranial nerve deficit.     Motor: No abnormal muscle tone.     Coordination: Coordination normal.     Deep Tendon Reflexes: Reflexes are normal and symmetric.  Psychiatric:        Behavior: Behavior normal.        Thought Content: Thought content normal.        Judgment: Judgment normal.           Assessment & Plan:  Lipoma of right axilla - Plan: Ambulatory referral to General Surgery Patient has chronic back pain in the thoracic and lumbar spine due to degenerative disc disease.  However the pain is now more widespread up and down the spine and vague.  However he has a lipoma in his posterior right axilla just lateral to the scapula.  He complains of pins-and-needles pain isolated only in that lipoma and he would like a lipoma  removed.  Therefore I will consult general surgery.  Perhaps the lipoma is pressing against a cutaneous nerve causing pain.  There is no evidence of any infection or malignancy based on his exam

## 2023-04-22 ENCOUNTER — Telehealth: Payer: Self-pay

## 2023-04-22 NOTE — Telephone Encounter (Signed)
 PAP: Patient assistance application for Farxiga through [employer] (AZ&Me) has been mailed to pt's home address on file. Provider portion of application will be faxed to provider's office.

## 2023-05-02 NOTE — Telephone Encounter (Signed)
 Received Patient page of APP AZ&MEMarcelline Jones) waiting on Provider page.

## 2023-05-05 ENCOUNTER — Ambulatory Visit: Payer: Medicare Other | Admitting: *Deleted

## 2023-05-05 DIAGNOSIS — Z0000 Encounter for general adult medical examination without abnormal findings: Secondary | ICD-10-CM

## 2023-05-05 NOTE — Progress Notes (Signed)
 Subjective:   James Jones is a 78 y.o. male who presents for Medicare Annual/Subsequent preventive examination.  Visit Complete: Virtual I connected with  James Jones on 05/05/23 by a audio enabled telemedicine application and verified that I am speaking with the correct person using two identifiers.  Patient Location: Home  Provider Location: Home Office  I discussed the limitations of evaluation and management by telemedicine. The patient expressed understanding and agreed to proceed.  Vital Signs: Because this visit was a virtual/telehealth visit, some criteria may be missing or patient reported. Any vitals not documented were not able to be obtained and vitals that have been documented are patient reported.  Patient Medicare AWV questionnaire was completed by the patient on 04-29-2023; I have confirmed that all information answered by patient is correct and no changes since this date.  Cardiac Risk Factors include: advanced age (>1men, >109 women);diabetes mellitus;male gender;hypertension     Objective:    There were no vitals filed for this visit. There is no height or weight on file to calculate BMI.     05/05/2023    9:01 AM 10/19/2022   11:21 AM 04/29/2022    1:40 PM 10/05/2021   11:50 AM 04/23/2021    9:24 AM 10/05/2017   12:01 PM 11/27/2015   11:19 AM  Advanced Directives  Does Patient Have a Medical Advance Directive? No No No No No No No  Would patient like information on creating a medical advance directive?  No - Patient declined No - Patient declined No - Patient declined No - Patient declined      Current Medications (verified) Outpatient Encounter Medications as of 05/05/2023  Medication Sig   albuterol (VENTOLIN HFA) 108 (90 Base) MCG/ACT inhaler Inhale 2 puffs into the lungs every 6 (six) hours as needed for wheezing or shortness of breath.   allopurinol (ZYLOPRIM) 100 MG tablet Take 1 tablet (100 mg total) by mouth daily.   amLODipine (NORVASC) 10 MG  tablet Take 1 tablet (10 mg total) by mouth daily.   aspirin EC 81 MG tablet Take 1 tablet (81 mg total) by mouth daily. Swallow whole.   Blood Glucose Monitoring Suppl (FREESTYLE LITE) DEVI Use to check blood sugar once daily   carvedilol (COREG) 25 MG tablet Take 1 tablet (25 mg total) by mouth 2 (two) times daily.   Cholecalciferol (VITAMIN D-3) 1000 UNITS CAPS Take by mouth daily. 2 capsules.   dapagliflozin propanediol (FARXIGA) 10 MG TABS tablet Take 1 tablet (10 mg total) by mouth daily before breakfast.   doxazosin (CARDURA) 8 MG tablet TAKE 1 TABLET (8 MG TOTAL) BY MOUTH 2 (TWO) TIMES DAILY.   famotidine (PEPCID) 20 MG tablet TAKE 1 TABLET BY MOUTH EVERYDAY AT BEDTIME   ferrous sulfate 325 (65 FE) MG tablet Take 1 tablet (325 mg total) by mouth 2 (two) times daily with a meal.   fish oil-omega-3 fatty acids 1000 MG capsule Take 1,200 mg by mouth daily.    FREESTYLE LITE test strip USE TO CHECK BLOOD SUGAR 1-2X DAILY. E11.59   Multiple Vitamin (MULTIVITAMIN WITH MINERALS) TABS Take 1 tablet by mouth daily.   pantoprazole (PROTONIX) 40 MG tablet TAKE 1 TABLET BY MOUTH EVERY DAY   predniSONE (DELTASONE) 20 MG tablet 3 tabs poqday 1-2, 2 tabs poqday 3-4, 1 tab poqday 5-6   PRESCRIPTION MEDICATION Uses C-PAP at bedtime   rosuvastatin (CRESTOR) 40 MG tablet TAKE 1 TABLET BY MOUTH EVERY DAY   sodium bicarbonate 650 MG tablet Take  650 mg by mouth daily.   umeclidinium-vilanterol (ANORO ELLIPTA) 62.5-25 MCG/ACT AEPB Inhale 1 puff into the lungs daily.   valsartan (DIOVAN) 320 MG tablet TAKE 1 TABLET BY MOUTH EVERY DAY   nitroGLYCERIN (NITROSTAT) 0.4 MG SL tablet Place 1 tablet (0.4 mg total) under the tongue every 5 (five) minutes as needed for chest pain.   No facility-administered encounter medications on file as of 05/05/2023.    Allergies (verified) Atorvastatin   History: Past Medical History:  Diagnosis Date   Aortic stenosis 04/02/2021   Chronic diastolic heart failure (HCC)     CKD (chronic kidney disease) stage 2, GFR 60-89 ml/min 03/17/2012   Coronary artery disease, non-occlusive 03/18/2012   60-70% stenosis in RCA -- FFR 0.84; EF 60-65%   Diabetes mellitus without complication (HCC)    GERD (gastroesophageal reflux disease)    On PPI   History of GI bleed 02/2013   No obvious findings on EGD/colonoscopy   Hydrocele, bilateral    large on [place] 2020   Hyperlipidemia    Hypertension    Obesity (BMI 30.0-34.9) 10/02/2012   OSA (obstructive sleep apnea) 03/18/2012   Doing better with CPAP   Pulmonary hypertension (HCC) 12/10/2010   ECHO:  Mild PH,mild LVH; PA pressures estimated 30-40 mmHg   Pulmonary hypertension, unspecified (HCC) 06/04/2021   RBBB, 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   Event monitor; usually during sleeping hours; therefore not on beta blocker   Past Surgical History:  Procedure Laterality Date   APPENDECTOMY  1974   CARPAL TUNNEL RELEASE     EYE SURGERY  1960   Left eye   HEMORRHOID SURGERY     LEFT HEART CATH AND CORONARY ANGIOGRAPHY  03/27/2010   Moderate mid RCA lesion,right radial approach,normal EF   LEFT HEART CATHETERIZATION WITH CORONARY ANGIOGRAM N/A 03/17/2012   Procedure: LEFT HEART CATHETERIZATION WITH CORONARY ANGIOGRAM;  Surgeon: Marykay Lex, MD;  Location: [facility] C[facility];  Service: Cardiovascular: Fractional Flow Reserve Measurement of mid RCA 60-70% stenosis = 0.84. Otherwise mild LCA CAD.  Normal EF & EDP   SHOULDER ARTHROSCOPY  2006   TRANSTHORACIC ECHOCARDIOGRAM  12/2010    Mild concentric LVH.  EF> 55%.  GR 1 DD.  Mild aortic sclerosis no stenosis.  PA pressures estimated 30-40 mmHg   Family History  Problem Relation Age of Onset   Hypertension Mother    Kidney failure Mother    Coronary artery disease Father    Hypertension Sister    Kidney failure Sister    Stroke Maternal Grandmother    Social History   Socioeconomic History   Marital status: Married    Spouse name: Not on file   Number  of children: Not on file   Years of education: Not on file   Highest education level: Not on file  Occupational History   Not on file  Tobacco Use   Smoking status: Former    Current packs/day: 0.00    Average packs/day: 2.0 packs/day for 50.0 years (100.0 ttl pk-yrs)    Types: Cigarettes    Start date: 10/10/1970    Quit date: 10/09/2020    Years since quitting: 2.5   Smokeless tobacco: Never  Vaping Use   Vaping status: Never Used  Substance and Sexual Activity   Alcohol use: Yes    Alcohol/week: 1.0 standard drink of alcohol    Types: 1 Standard drinks or equivalent per week   Drug use: Yes    Types: Marijuana  Comment: cannibus, occ use   Sexual activity: Not Currently  Other Topics Concern   Not on file  Social History Narrative   Married, father of 2, grandfather of 3.   He is a former smoker of about a pack to pack and half cigarettes a day -- he quit in March of this year.    He is an avid exerciser working at least 4-5 days a week doing her walking or stationary bike.   Social Drivers of Corporate investment banker Strain: Low Risk  (05/05/2023)   Overall Financial Resource Strain (CARDIA)    Difficulty of Paying Living Expenses: Not hard at all  Food Insecurity: No Food Insecurity (05/05/2023)   Hunger Vital Sign    Worried About Running Out of Food in the Last Year: Never true    Ran Out of Food in the Last Year: Never true  Transportation Needs: No Transportation Needs (05/05/2023)   PRAPARE - Administrator, Civil Service (Medical): No    Lack of Transportation (Non-Medical): No  Physical Activity: Sufficiently Active (05/05/2023)   Exercise Vital Sign    Days of Exercise per Week: 5 days    Minutes of Exercise per Session: 40 min  Stress: No Stress Concern Present (05/05/2023)   [employer] of Occupational Health - Occupational Stress Questionnaire    Feeling of Stress : Only a little  Social Connections: Unknown (05/05/2023)   Social  Connection and Isolation Panel [NHANES]    Frequency of Communication with Friends and Family: Patient declined    Frequency of Social Gatherings with Friends and Family: Twice a week    Attends Religious Services: More than 4 times per year    Active Member of [employer] or Organizations: Yes    Attends Banker Meetings: Patient declined    Marital Status: Married    Tobacco Counseling Counseling given: Not Answered   Clinical Intake:  Pre-visit preparation completed: Yes  Pain : No/denies pain     Diabetes: Yes CBG done?: No Did pt. bring in CBG monitor from home?: No  How often do you need to have someone help you when you read instructions, pamphlets, or other written materials from your doctor or pharmacy?: 1 - Never  Interpreter Needed?: No  Information entered by :: Remi Haggard LPN   Activities of Daily Living    05/05/2023    9:12 AM 04/29/2023   10:11 AM  In your present state of health, do you have any difficulty performing the following activities:  Hearing? 0 0  Vision? 0 0  Difficulty concentrating or making decisions? 0 0  Walking or climbing stairs? 0 0  Dressing or bathing? 0 0  Doing errands, shopping? 0 0  Preparing Food and eating ? N N  Using the Toilet? N N  In the past six months, have you accidently leaked urine? N N  Do you have problems with loss of bowel control? N N  Managing your Medications? N N  Managing your Finances? N N  Housekeeping or managing your Housekeeping? N N    Patient Care Team: Donita Brooks, MD as PCP - General (Family Medicine) Chilton Si, MD as PCP - Cardiology (Cardiology) Erroll Luna, [facility] (Inactive) as Pharmacist (Pharmacist) Carlus Pavlov, MD as Consulting Physician (Internal Medicine) Janet Berlin, MD as Consulting Physician (Ophthalmology)  Indicate any recent Medical Services you may have received from other than [facility] providers in the past year (date may be  approximate).  Assessment:   This is a routine wellness examination for [place].  Hearing/Vision screen Hearing Screening - Comments:: No trouble hearing Vision Screening - Comments:: [facility] Up to date   Goals Addressed             This Visit's Progress    Patient Stated       Stay healthy       Depression Screen    05/05/2023    9:03 AM 02/22/2023    8:00 AM 10/19/2022   10:09 AM 04/29/2022    1:39 PM 04/23/2021    9:21 AM 10/16/2020    8:49 AM 04/15/2020    9:26 AM  PHQ 2/9 Scores  PHQ - 2 Score 0 0 0 0 0 0 0  PHQ- 9 Score 2          Fall Risk    05/05/2023    9:03 AM 04/29/2023   10:11 AM 02/22/2023    8:00 AM 10/19/2022   10:09 AM 04/29/2022    9:41 AM  Fall Risk   Falls in the past year? 0 0 0 0 0  Number falls in past yr: 0  0  0  Injury with Fall? 0 0 0  0  Risk for fall due to :   No Fall Risks  No Fall Risks  Follow up Falls evaluation completed;Education provided;Falls prevention discussed  Falls prevention discussed  Falls prevention discussed;Education provided;Falls evaluation completed    MEDICARE RISK AT HOME: Medicare Risk at Home Any stairs in or around the home?: Yes If so, are there any without handrails?: No Home free of loose throw rugs in walkways, pet beds, electrical cords, etc?: Yes Adequate lighting in your home to reduce risk of falls?: Yes Life alert?: No Use of a cane, walker or w/c?: No Grab bars in the bathroom?: No Shower chair or bench in shower?: Yes Elevated toilet seat or a handicapped toilet?: No  TIMED UP AND GO:  Was the test performed?  No    Cognitive Function:        05/05/2023    9:02 AM 04/29/2022    1:41 PM 04/15/2020    9:24 AM  6CIT Screen  What Year? 0 points 0 points 0 points  What month? 0 points 0 points 0 points  What time? 0 points 0 points 0 points  Count back from 20 0 points 0 points 0 points  Months in reverse 0 points 0 points 0 points  Repeat phrase 0 points 0 points 0 points  Total Score 0  points 0 points 0 points    Immunizations Immunization History  Administered Date(s) Administered   Influenza Split 11/09/2011, 11/08/2012   Influenza, High Dose Seasonal PF 11/24/2017   Influenza,inj,Quad PF,6+ Mos 10/22/2014, 10/29/2015   Influenza-Unspecified 01/27/2021, 11/27/2021   Moderna Covid-19 Seasonal Vaccine 6 months thru 80years of age 71/20/2023   PFIZER(Purple Top)SARS-COV-2 Vaccination 02/23/2019, 03/16/2019, 11/03/2019, 05/13/2020   Pfizer Covid-19 Vaccine Bivalent Booster 14yrs & up 12/08/2020   Pneumococcal Conjugate-13 04/13/2016   Pneumococcal Polysaccharide-23 02/09/2008, 04/14/2018   Tdap 02/09/2008    TDAP status: Due, Education has been provided regarding the importance of this vaccine. Advised may receive this vaccine at local pharmacy or Health Dept. Aware to provide a copy of the vaccination record if obtained from local pharmacy or Health Dept. Verbalized acceptance and understanding.  Flu Vaccine status: Due, Education has been provided regarding the importance of this vaccine. Advised may receive this vaccine at local pharmacy or Health  Dept. Aware to provide a copy of the vaccination record if obtained from local pharmacy or Health Dept. Verbalized acceptance and understanding.  Pneumococcal vaccine status: Up to date  Covid-19 vaccine status: Information provided on how to obtain vaccines.   Qualifies for Shingles Vaccine? Yes   Zostavax completed No   Shingrix Completed?: No.    Education has been provided regarding the importance of this vaccine. Patient has been advised to call insurance company to determine out of pocket expense if they have not yet received this vaccine. Advised may also receive vaccine at local pharmacy or Health Dept. Verbalized acceptance and understanding.  Screening Tests Health Maintenance  Topic Date Due   COVID-19 Vaccine (6 - 2024-25 season) 10/10/2022   INFLUENZA VACCINE  05/09/2023 (Originally 09/09/2022)   Zoster  Vaccines- Shingrix (1 of 2) 05/23/2023 (Originally 11/24/1964)   OPHTHALMOLOGY EXAM  05/11/2023   HEMOGLOBIN A1C  06/27/2023   Diabetic kidney evaluation - Urine ACR  07/30/2023   FOOT EXAM  08/10/2023   Lung Cancer Screening  10/19/2023   Diabetic kidney evaluation - eGFR measurement  10/19/2023   Medicare Annual Wellness (AWV)  05/04/2024   Pneumonia Vaccine 61+ Years old  Completed   Hepatitis C Screening  Completed   HPV VACCINES  Aged Out   DTaP/Tdap/Td  Discontinued   Colonoscopy  Discontinued    Health Maintenance  Health Maintenance Due  Topic Date Due   COVID-19 Vaccine (6 - 2024-25 season) 10/10/2022    Colorectal cancer screening: No longer required.   Lung Cancer Screening: (Low Dose CT Chest recommended if Age 66-80 years, 20 pack-year currently smoking OR have quit w/in 15years.) does qualify.   Lung Cancer Screening Referral: due 10-2023  Additional Screening:  Hepatitis C Screening: does not qualify; Completed 2022  Vision Screening: Recommended annual ophthalmology exams for early detection of glaucoma and other disorders of the eye. Is the patient up to date with their annual eye exam?  Yes  Who is the provider or what is the name of the office in which the patient attends annual eye exams? [facility] If pt is not established with a provider, would they like to be referred to a provider to establish care? No .   Dental Screening: Recommended annual dental exams for proper oral hygiene  Nutrition Risk Assessment:  Has the patient had any N/V/D within the last 2 months?  No  Does the patient have any non-healing wounds?  No  Has the patient had any unintentional weight loss or weight gain?  No   Diabetes:  Is the patient diabetic?  Yes  If diabetic, was a CBG obtained today?  No  Did the patient bring in their glucometer from home?  No  How often do you monitor your CBG's? 1 x a day.   Financial Strains and Diabetes Management:  Are you having any  financial strains with the device, your supplies or your medication? No .  Does the patient want to be seen by Chronic Care Management for management of their diabetes?  No  Would the patient like to be referred to a Nutritionist or for Diabetic Management?  No   Diabetic Exams:  Diabetic Eye Exam: Completed . Pt has been advised about the importance in completing this exam  Diabetic Foot Exam: . Pt has been advised about the importance in completing this exam..    Community Resource Referral / Chronic Care Management: CRR required this visit?  No   CCM required this visit?  No     Plan:     I have personally reviewed and noted the following in the patient's chart:   Medical and social history Use of alcohol, tobacco or illicit drugs  Current medications and supplements including opioid prescriptions. Patient is not currently taking opioid prescriptions. Functional ability and status Nutritional status Physical activity Advanced directives List of other physicians Hospitalizations, surgeries, and ER visits in previous 12 months Vitals Screenings to include cognitive, depression, and falls Referrals and appointments  In addition, I have reviewed and discussed with patient certain preventive protocols, quality metrics, and best practice recommendations. A written personalized care plan for preventive services as well as general preventive health recommendations were provided to patient.     Remi Haggard, LPN   [id number]   After Visit Summary: (MyChart) Due to this being a telephonic visit, the after visit summary with patients personalized plan was offered to patient via MyChart   Nurse Notes:

## 2023-05-05 NOTE — Patient Instructions (Signed)
 [name] , Thank you for taking time to come for your Medicare Wellness Visit. I appreciate your ongoing commitment to your health goals. Please review the following plan we discussed and let me know if I can assist you in the future.   Screening recommendations/referrals: Colonoscopy: no longer required Recommended yearly ophthalmology/optometry visit for glaucoma screening and checkup Recommended yearly dental visit for hygiene and checkup  Vaccinations: Influenza vaccine: Education provided Pneumococcal vaccine: up to date Tdap vaccine: Education provided Shingles vaccine: Education provided    Advanced directives: Education provided   Preventive Care 65 Years and Older, Male Preventive care refers to lifestyle choices and visits with your health care provider that can promote health and wellness. What does preventive care include? A yearly physical exam. This is also called an annual well check. Dental exams once or twice a year. Routine eye exams. Ask your health care provider how often you should have your eyes checked. Personal lifestyle choices, including: Daily care of your teeth and gums. Regular physical activity. Eating a healthy diet. Avoiding tobacco and drug use. Limiting alcohol use. Practicing safe sex. Taking low doses of aspirin every day. Taking vitamin and mineral supplements as recommended by your health care provider. What happens during an annual well check? The services and screenings done by your health care provider during your annual well check will depend on your age, overall health, lifestyle risk factors, and family history of disease. Counseling  Your health care provider may ask you questions about your: Alcohol use. Tobacco use. Drug use. Emotional well-being. Home and relationship well-being. Sexual activity. Eating habits. History of falls. Memory and ability to understand (cognition). Work and work Astronomer. Screening  You may  have the following tests or measurements: Height, weight, and BMI. Blood pressure. Lipid and cholesterol levels. These may be checked every 5 years, or more frequently if you are over 68 years old. Skin check. Lung cancer screening. You may have this screening every year starting at age 65 if you have a 30-pack-year history of smoking and currently smoke or have quit within the past 15 years. Fecal occult blood test (FOBT) of the stool. You may have this test every year starting at age 44. Flexible sigmoidoscopy or colonoscopy. You may have a sigmoidoscopy every 5 years or a colonoscopy every 10 years starting at age 43. Prostate cancer screening. Recommendations will vary depending on your family history and other risks. Hepatitis C blood test. Hepatitis B blood test. Sexually transmitted disease (STD) testing. Diabetes screening. This is done by checking your blood sugar (glucose) after you have not eaten for a while (fasting). You may have this done every 1-3 years. Abdominal aortic aneurysm (AAA) screening. You may need this if you are a current or former smoker. Osteoporosis. You may be screened starting at age 58 if you are at high risk. Talk with your health care provider about your test results, treatment options, and if necessary, the need for more tests. Vaccines  Your health care provider may recommend certain vaccines, such as: Influenza vaccine. This is recommended every year. Tetanus, diphtheria, and acellular pertussis (Tdap, Td) vaccine. You may need a Td booster every 10 years. Zoster vaccine. You may need this after age 72. Pneumococcal 13-valent conjugate (PCV13) vaccine. One dose is recommended after age 10. Pneumococcal polysaccharide (PPSV23) vaccine. One dose is recommended after age 61. Talk to your health care provider about which screenings and vaccines you need and how often you need them. This information is not  intended to replace advice given to you by your  health care provider. Make sure you discuss any questions you have with your health care provider. Document Released: 02/21/2015 Document Revised: 10/15/2015 Document Reviewed: 11/26/2014 Elsevier Interactive Patient Education  2017 [employer].  Fall Prevention in the Home Falls can cause injuries. They can happen to people of all ages. There are many things you can do to make your home safe and to help prevent falls. What can I do on the outside of my home? Regularly fix the edges of walkways and driveways and fix any cracks. Remove anything that might make you trip as you walk through a door, such as a raised step or threshold. Trim any bushes or trees on the path to your home. Use bright outdoor lighting. Clear any walking paths of anything that might make someone trip, such as rocks or tools. Regularly check to see if handrails are loose or broken. Make sure that both sides of any steps have handrails. Any raised decks and porches should have guardrails on the edges. Have any leaves, snow, or ice cleared regularly. Use sand or salt on walking paths during winter. Clean up any spills in your garage right away. This includes oil or grease spills. What can I do in the bathroom? Use night lights. Install grab bars by the toilet and in the tub and shower. Do not use towel bars as grab bars. Use non-skid mats or decals in the tub or shower. If you need to sit down in the shower, use a plastic, non-slip stool. Keep the floor dry. Clean up any water that spills on the floor as soon as it happens. Remove soap buildup in the tub or shower regularly. Attach bath mats securely with double-sided non-slip rug tape. Do not have throw rugs and other things on the floor that can make you trip. What can I do in the bedroom? Use night lights. Make sure that you have a light by your bed that is easy to reach. Do not use any sheets or blankets that are too big for your bed. They should not hang down  onto the floor. Have a firm chair that has side arms. You can use this for support while you get dressed. Do not have throw rugs and other things on the floor that can make you trip. What can I do in the kitchen? Clean up any spills right away. Avoid walking on wet floors. Keep items that you use a lot in easy-to-reach places. If you need to reach something above you, use a strong step stool that has a grab bar. Keep electrical cords out of the way. Do not use floor polish or wax that makes floors slippery. If you must use wax, use non-skid floor wax. Do not have throw rugs and other things on the floor that can make you trip. What can I do with my stairs? Do not leave any items on the stairs. Make sure that there are handrails on both sides of the stairs and use them. Fix handrails that are broken or loose. Make sure that handrails are as long as the stairways. Check any carpeting to make sure that it is firmly attached to the stairs. Fix any carpet that is loose or worn. Avoid having throw rugs at the top or bottom of the stairs. If you do have throw rugs, attach them to the floor with carpet tape. Make sure that you have a light switch at the top of the stairs  and the bottom of the stairs. If you do not have them, ask someone to add them for you. What else can I do to help prevent falls? Wear shoes that: Do not have high heels. Have rubber bottoms. Are comfortable and fit you well. Are closed at the toe. Do not wear sandals. If you use a stepladder: Make sure that it is fully opened. Do not climb a closed stepladder. Make sure that both sides of the stepladder are locked into place. Ask someone to hold it for you, if possible. Clearly mark and make sure that you can see: Any grab bars or handrails. First and last steps. Where the edge of each step is. Use tools that help you move around (mobility aids) if they are needed. These include: Canes. Walkers. Scooters. Crutches. Turn  on the lights when you go into a dark area. Replace any light bulbs as soon as they burn out. Set up your furniture so you have a clear path. Avoid moving your furniture around. If any of your floors are uneven, fix them. If there are any pets around you, be aware of where they are. Review your medicines with your doctor. Some medicines can make you feel dizzy. This can increase your chance of falling. Ask your doctor what other things that you can do to help prevent falls. This information is not intended to replace advice given to you by your health care provider. Make sure you discuss any questions you have with your health care provider. Document Released: 11/21/2008 Document Revised: 07/03/2015 Document Reviewed: 03/01/2014 Elsevier Interactive Patient Education  2017 [employer].

## 2023-05-09 DIAGNOSIS — N1832 Chronic kidney disease, stage 3b: Secondary | ICD-10-CM | POA: Diagnosis not present

## 2023-05-10 ENCOUNTER — Encounter: Payer: Self-pay | Admitting: General Surgery

## 2023-05-10 ENCOUNTER — Ambulatory Visit (INDEPENDENT_AMBULATORY_CARE_PROVIDER_SITE_OTHER): Admitting: General Surgery

## 2023-05-10 VITALS — BP 144/74 | HR 54 | Temp 97.7°F | Resp 14 | Ht 68.0 in | Wt 204.0 lb

## 2023-05-10 DIAGNOSIS — D171 Benign lipomatous neoplasm of skin and subcutaneous tissue of trunk: Secondary | ICD-10-CM | POA: Insufficient documentation

## 2023-05-10 NOTE — Telephone Encounter (Signed)
 Resent Provider portion to office for PAP [employer] (AZ&ME) enrollment 2025.

## 2023-05-10 NOTE — Progress Notes (Unsigned)
 [facility] History and Physical  Reason for Referral:*** Referring Physician: ***  Chief Complaint   New Patient (Initial Visit)     James Jones is a 78 y.o. male.  HPI:   Discussed the use of AI scribe software for clinical note transcription with the patient, who gave verbal consent to proceed.  History of Present Illness      ***.  The *** started *** and has had a duration of ***.  It is associated with ***.  The *** is improved with ***, and is made worse with ***.    Quality*** Context***  Past Medical History:  Diagnosis Date  . Aortic stenosis 04/02/2021  . Chronic diastolic heart failure (HCC)   . 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 Coronary artery disease, non-occlusive 03/18/2012   60-70% stenosis in RCA -- FFR 0.84; EF 60-65%  . Diabetes mellitus without complication (HCC)   . GERD (gastroesophageal reflux disease)    On PPI  . History of GI bleed 02/2013   No obvious findings on EGD/colonoscopy  . Hydrocele, bilateral    large on [place] 2020  . Hyperlipidemia   . Hypertension   . Obesity (BMI 30.0-34.9) 10/02/2012  . OSA (obstructive sleep apnea) 03/18/2012   Doing better with CPAP  . Pulmonary hypertension (HCC) 12/10/2010   ECHO:  Mild PH,mild LVH; PA pressures estimated 30-40 mmHg  . Pulmonary hypertension, unspecified (HCC) 06/04/2021  . 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   Event monitor; usually during sleeping hours; therefore not on beta blocker    Past Surgical History:  Procedure Laterality Date  . APPENDECTOMY  1974  . CARPAL TUNNEL RELEASE    . EYE SURGERY  1960   Left eye  . HEMORRHOID SURGERY    . LEFT HEART CATH AND CORONARY ANGIOGRAPHY  03/27/2010   Moderate mid RCA lesion,right radial approach,normal EF  . LEFT HEART CATHETERIZATION WITH CORONARY ANGIOGRAM N/A 03/17/2012   Procedure: LEFT HEART CATHETERIZATION WITH CORONARY ANGIOGRAM;  Surgeon: Marykay Lex, MD;  Location: [facility] CATH LAB;  Service: Cardiovascular: Fractional Flow Reserve Measurement of mid RCA 60-70% stenosis = 0.84. Otherwise mild LCA CAD.  Normal EF & EDP  . SHOULDER ARTHROSCOPY  2006  . TRANSTHORACIC ECHOCARDIOGRAM  12/2010    Mild concentric LVH.  EF> 55%.  GR 1 DD.  Mild aortic sclerosis no stenosis.  PA pressures estimated 30-40 mmHg    Family History  Problem Relation Age of Onset  . Hypertension Mother   . Kidney failure Mother   . Coronary artery disease Father   . Hypertension Sister   . Kidney failure Sister   . Stroke Maternal Grandmother     Social History   Tobacco Use  . Smoking status: Former    Current packs/day: 0.00    Average packs/day: 2.0 packs/day for 50.0 years (100.0 ttl pk-yrs)    Types: Cigarettes    Start date: 10/10/1970    Quit date: 10/09/2020    Years since quitting: 2.5  . Smokeless tobacco: Never  Vaping Use  . Vaping status: Never Used  Substance Use Topics  . Alcohol use: Yes    Alcohol/week: 1.0 standard drink of alcohol    Types: 1 Standard drinks or equivalent per week  . Drug use: Yes    Types: Marijuana    Comment: cannibus, occ use    Medications: {medication reviewed/display:3041432} Allergies as of 05/10/2023       Reactions  Atorvastatin    myalgias        Medication List        Accurate as of May 10, 2023  9:58 AM. If you have any questions, ask your nurse or doctor.          STOP taking these medications    predniSONE 20 MG tablet Commonly known as: DELTASONE Stopped by: Lucretia Roers       TAKE these medications    albuterol 108 (90 Base) MCG/ACT inhaler Commonly known as: VENTOLIN HFA Inhale 2 puffs into the lungs every 6 (six) hours as needed for wheezing or shortness of breath.   allopurinol 100 MG tablet Commonly known as: ZYLOPRIM Take 1 tablet (100 mg total) by mouth daily.   amLODipine 10 MG tablet Commonly known as: NORVASC Take 1 tablet (10 mg total) by mouth  daily.   Anoro Ellipta 62.5-25 MCG/ACT Aepb Generic drug: umeclidinium-vilanterol Inhale 1 puff into the lungs daily.   aspirin EC 81 MG tablet Take 1 tablet (81 mg total) by mouth daily. Swallow whole.   carvedilol 25 MG tablet Commonly known as: COREG Take 1 tablet (25 mg total) by mouth 2 (two) times daily.   dapagliflozin propanediol 10 MG Tabs tablet Commonly known as: Farxiga Take 1 tablet (10 mg total) by mouth daily before breakfast.   doxazosin 8 MG tablet Commonly known as: CARDURA TAKE 1 TABLET (8 MG TOTAL) BY MOUTH 2 (TWO) TIMES DAILY.   famotidine 20 MG tablet Commonly known as: PEPCID TAKE 1 TABLET BY MOUTH EVERYDAY AT BEDTIME   ferrous sulfate 325 (65 FE) MG tablet Take 1 tablet (325 mg total) by mouth 2 (two) times daily with a meal.   fish oil-omega-3 fatty acids 1000 MG capsule Take 1,200 mg by mouth daily.   FreeStyle Lite Devi Use to check blood sugar once daily   FREESTYLE LITE test strip Generic drug: glucose blood USE TO CHECK BLOOD SUGAR 1-2X DAILY. E11.59   multivitamin with minerals Tabs tablet Take 1 tablet by mouth daily.   nitroGLYCERIN 0.4 MG SL tablet Commonly known as: NITROSTAT Place 1 tablet (0.4 mg total) under the tongue every 5 (five) minutes as needed for chest pain.   pantoprazole 40 MG tablet Commonly known as: PROTONIX TAKE 1 TABLET BY MOUTH EVERY DAY   PRESCRIPTION MEDICATION Uses C-PAP at bedtime   rosuvastatin 40 MG tablet Commonly known as: CRESTOR TAKE 1 TABLET BY MOUTH EVERY DAY   sodium bicarbonate 650 MG tablet Take 650 mg by mouth daily.   valsartan 320 MG tablet Commonly known as: DIOVAN TAKE 1 TABLET BY MOUTH EVERY DAY   Vitamin D-3 25 MCG (1000 UT) Caps Take by mouth daily. 2 capsules.         ROS:  {Review of Systems:30496}  Blood pressure (!) 144/74, pulse (!) 54, temperature 97.7 F (36.5 C), temperature source Oral, resp. rate 14, height 5\' 8"  (1.727 m), weight 204 lb (92.5 kg), SpO2  94%. Physical Exam Physical Exam   Results: No results found for this or any previous visit (from the past 48 hours).  No results found.   Assessment and Plan: Assessment and Plan Assessment & Plan      James Jones is a 78 y.o. male with *** -*** -*** -Follow up ***  All questions were answered to the satisfaction of the patient and family***.  The risk and benefits of *** were discussed including but not limited to ***.  After careful consideration, Treshun  Baena has decided to ***.    Lucretia Roers 05/10/2023, 9:58 AM

## 2023-05-10 NOTE — Patient Instructions (Addendum)
 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 T8-9 pinched nerve on the left (this would be in the mid back).   I do not think the lipoma is necessarily causing the tingling sensation on the right side. If you decide you want to pursue removal, we can do this in the office with local numbing medication.  You would have stitches in for 14 days or so after this.  There is no guarantee that the tingling pain would improve with this being removed.

## 2023-05-12 ENCOUNTER — Other Ambulatory Visit: Payer: Self-pay

## 2023-05-12 NOTE — Telephone Encounter (Signed)
 Prescription Request  05/12/2023  LOV: 05/05/23  What is the name of the medication or equipment? rosuvastatin (CRESTOR) 40 MG tablet [[id number]]   Have you contacted your pharmacy to request a refill? Yes   Which pharmacy would you like this sent to?  [id number] [place], [place] - [address] [place] [address] Phone: [phone] Fax: [phone]    Patient notified that their request is being sent to the clinical staff for review and that they should receive a response within 2 business days.   Please advise at [facility] [phone]

## 2023-05-13 MED ORDER — ROSUVASTATIN CALCIUM 40 MG PO TABS: 40.0000 mg | ORAL_TABLET | Freq: Every day | ORAL | 0 refills | Status: DC

## 2023-05-13 NOTE — Telephone Encounter (Signed)
 Requested Prescriptions  Pending Prescriptions Disp Refills   rosuvastatin (CRESTOR) 40 MG tablet 90 tablet 1    Sig: Take 1 tablet (40 mg total) by mouth daily.     Cardiovascular:  Antilipid - Statins 2 Failed - 05/13/2023 11:06 AM      Failed - Cr in normal range and within 360 days    Creat  Date Value Ref Range Status  07/30/2022 2.04 (H) 0.70 - 1.28 mg/dL Final   Creatinine, Ser  Date Value Ref Range Status  10/19/2022 2.11 (H) 0.61 - 1.24 mg/dL Final   Creatinine, Urine  Date Value Ref Range Status  07/30/2022 52 20 - 320 mg/dL Final         Failed - Lipid Panel in normal range within the last 12 months    Cholesterol, Total  Date Value Ref Range Status  12/25/2020 140 100 - 199 mg/dL Final   Cholesterol  Date Value Ref Range Status  07/30/2022 143 <200 mg/dL Final   LDL Cholesterol (Calc)  Date Value Ref Range Status  07/30/2022 66 mg/dL (calc) Final    Comment:    Reference range: <100 . Desirable range <100 mg/dL for primary prevention;   <70 mg/dL for patients with CHD or diabetic patients  with > or = 2 CHD risk factors. Marland Kitchen LDL-C is now calculated using the Martin-Hopkins  calculation, which is a validated novel method providing  better accuracy than the Friedewald equation in the  estimation of LDL-C.  Horald Pollen et al. Lenox Ahr. 4098;119(14): 2061-2068  (http://education.Quest[employer].com/faq/FAQ164)    HDL  Date Value Ref Range Status  07/30/2022 59 > OR = 40 mg/dL Final  78/29/5621 49 >30 mg/dL Final   Triglycerides  Date Value Ref Range Status  07/30/2022 92 <150 mg/dL Final         Passed - Patient is not pregnant      Passed - Valid encounter within last 12 months    Recent Outpatient Visits           3 weeks ago Lipoma of right axilla   [facility] [facility] Donita Brooks, MD   2 months ago Thoracic radiculopathy   [place] [facility] Donita Brooks, MD   6 months ago Atypical chest pain    [facility] Donita Brooks, MD   9 months ago Colon cancer screening   [place] [facility] Pickard, Priscille Heidelberg, MD

## 2023-05-16 NOTE — Telephone Encounter (Signed)
 PAP: Patient assistance application for [name] has been approved by PAP Companies: AZ&ME from 05/16/2023 to 02/08/2024. Medication should be delivered to PAP Delivery: Home. For further shipping updates, please contact [employer] (AZ[employer]Me) at [phone]. Patient ID is: PEP-ID [id number]

## 2023-05-16 NOTE — Telephone Encounter (Signed)
 PAP: Application for James Jones has been submitted to [employer] (AZ&Me), via fax

## 2023-05-17 DIAGNOSIS — I272 Pulmonary hypertension, unspecified: Secondary | ICD-10-CM | POA: Diagnosis not present

## 2023-05-17 DIAGNOSIS — E872 Acidosis, unspecified: Secondary | ICD-10-CM | POA: Diagnosis not present

## 2023-05-17 DIAGNOSIS — I129 Hypertensive chronic kidney disease with stage 1 through stage 4 chronic kidney disease, or unspecified chronic kidney disease: Secondary | ICD-10-CM | POA: Diagnosis not present

## 2023-05-17 DIAGNOSIS — E1129 Type 2 diabetes mellitus with other diabetic kidney complication: Secondary | ICD-10-CM | POA: Diagnosis not present

## 2023-05-17 DIAGNOSIS — K219 Gastro-esophageal reflux disease without esophagitis: Secondary | ICD-10-CM | POA: Diagnosis not present

## 2023-05-17 DIAGNOSIS — E1122 Type 2 diabetes mellitus with diabetic chronic kidney disease: Secondary | ICD-10-CM | POA: Diagnosis not present

## 2023-05-17 DIAGNOSIS — N1832 Chronic kidney disease, stage 3b: Secondary | ICD-10-CM | POA: Diagnosis not present

## 2023-05-17 DIAGNOSIS — R809 Proteinuria, unspecified: Secondary | ICD-10-CM | POA: Diagnosis not present

## 2023-05-18 DIAGNOSIS — H11823 Conjunctivochalasis, bilateral: Secondary | ICD-10-CM | POA: Diagnosis not present

## 2023-05-18 DIAGNOSIS — H02834 Dermatochalasis of left upper eyelid: Secondary | ICD-10-CM | POA: Diagnosis not present

## 2023-05-18 DIAGNOSIS — H02831 Dermatochalasis of right upper eyelid: Secondary | ICD-10-CM | POA: Diagnosis not present

## 2023-05-18 DIAGNOSIS — E119 Type 2 diabetes mellitus without complications: Secondary | ICD-10-CM | POA: Diagnosis not present

## 2023-05-18 DIAGNOSIS — H524 Presbyopia: Secondary | ICD-10-CM | POA: Diagnosis not present

## 2023-05-18 DIAGNOSIS — H26491 Other secondary cataract, right eye: Secondary | ICD-10-CM | POA: Diagnosis not present

## 2023-05-18 DIAGNOSIS — H52203 Unspecified astigmatism, bilateral: Secondary | ICD-10-CM | POA: Diagnosis not present

## 2023-05-20 ENCOUNTER — Other Ambulatory Visit: Payer: Self-pay | Admitting: Family Medicine

## 2023-05-20 ENCOUNTER — Other Ambulatory Visit: Payer: Self-pay

## 2023-05-20 DIAGNOSIS — I10 Essential (primary) hypertension: Secondary | ICD-10-CM

## 2023-05-20 DIAGNOSIS — M109 Gout, unspecified: Secondary | ICD-10-CM

## 2023-05-20 DIAGNOSIS — I2510 Atherosclerotic heart disease of native coronary artery without angina pectoris: Secondary | ICD-10-CM

## 2023-05-20 MED ORDER — VALSARTAN 320 MG PO TABS: 320.0000 mg | ORAL_TABLET | Freq: Every day | ORAL | 0 refills | Status: DC

## 2023-05-20 NOTE — Telephone Encounter (Signed)
 Prescription Request  05/20/2023  LOV: 04/21/23  What is the name of the medication or equipment? valsartan (DIOVAN) 320 MG tablet [[id number]]   Have you contacted your pharmacy to request a refill? Yes   Which pharmacy would you like this sent to?  [id number] [place], [place] - [address] [place] [address] Phone: [phone] Fax: [phone]    Patient notified that their request is being sent to the clinical staff for review and that they should receive a response within 2 business days.   Please advise at [facility] [phone]

## 2023-05-20 NOTE — Telephone Encounter (Signed)
 Requested Prescriptions  Pending Prescriptions Disp Refills   valsartan (DIOVAN) 320 MG tablet 90 tablet 0    Sig: Take 1 tablet (320 mg total) by mouth daily.     Cardiovascular:  Angiotensin Receptor Blockers Failed - 05/20/2023  1:38 PM      Failed - Cr in normal range and within 180 days    Creat  Date Value Ref Range Status  07/30/2022 2.04 (H) 0.70 - 1.28 mg/dL Final   Creatinine, Ser  Date Value Ref Range Status  10/19/2022 2.11 (H) 0.61 - 1.24 mg/dL Final   Creatinine, Urine  Date Value Ref Range Status  07/30/2022 52 20 - 320 mg/dL Final         Failed - K in normal range and within 180 days    Potassium  Date Value Ref Range Status  10/19/2022 3.6 3.5 - 5.1 mmol/L Final         Failed - Last BP in normal range    BP Readings from Last 1 Encounters:  05/10/23 (!) 144/74         Passed - Patient is not pregnant      Passed - Valid encounter within last 6 months    Recent Outpatient Visits           4 weeks ago Lipoma of right axilla   [facility] Donita Brooks, MD   2 months ago Thoracic radiculopathy   [facility] Donita Brooks, MD   7 months ago Atypical chest pain   [place] [facility] Donita Brooks, MD   9 months ago Colon cancer screening   [facility] Pickard, Priscille Heidelberg, MD

## 2023-05-20 NOTE — Telephone Encounter (Signed)
 Requested Prescriptions  Pending Prescriptions Disp Refills   rosuvastatin (CRESTOR) 40 MG tablet [Pharmacy Med Name: ROSUVASTATIN CALCIUM 40 MG TAB] 90 tablet 0    Sig: TAKE 1 TABLET BY MOUTH EVERY DAY     Cardiovascular:  Antilipid - Statins 2 Failed - 05/20/2023  1:37 PM      Failed - Cr in normal range and within 360 days    Creat  Date Value Ref Range Status  07/30/2022 2.04 (H) 0.70 - 1.28 mg/dL Final   Creatinine, Ser  Date Value Ref Range Status  10/19/2022 2.11 (H) 0.61 - 1.24 mg/dL Final   Creatinine, Urine  Date Value Ref Range Status  07/30/2022 52 20 - 320 mg/dL Final         Failed - Lipid Panel in normal range within the last 12 months    Cholesterol, Total  Date Value Ref Range Status  12/25/2020 140 100 - 199 mg/dL Final   Cholesterol  Date Value Ref Range Status  07/30/2022 143 <200 mg/dL Final   LDL Cholesterol (Calc)  Date Value Ref Range Status  07/30/2022 66 mg/dL (calc) Final    Comment:    Reference range: <100 . Desirable range <100 mg/dL for primary prevention;   <70 mg/dL for patients with CHD or diabetic patients  with > or = 2 CHD risk factors. Marland Kitchen LDL-C is now calculated using the Martin-Hopkins  calculation, which is a validated novel method providing  better accuracy than the Friedewald equation in the  estimation of LDL-C.  Horald Pollen et al. Lenox Ahr. 5784;696(29): 2061-2068  (http://education.[employer].com/faq/FAQ164)    HDL  Date Value Ref Range Status  07/30/2022 59 > OR = 40 mg/dL Final  52/84/1324 49 >40 mg/dL Final   Triglycerides  Date Value Ref Range Status  07/30/2022 92 <150 mg/dL Final         Passed - Patient is not pregnant      Passed - Valid encounter within last 12 months    Recent Outpatient Visits           4 weeks ago Lipoma of right axilla   [place] [facility] Pickard, Priscille Heidelberg, MD   2 months ago Thoracic radiculopathy   [place] [facility] Donita Brooks, MD   7 months ago Atypical chest pain   [place] [facility] Tanya Nones, Priscille Heidelberg, MD   9 months ago Colon cancer screening   [place] [facility] Tanya Nones, Priscille Heidelberg, MD               allopurinol (ZYLOPRIM) 100 MG tablet [Pharmacy Med Name: ALLOPURINOL 100 MG TABLET] 90 tablet 0    Sig: TAKE 1 TABLET BY MOUTH EVERY DAY     Endocrinology:  Gout Agents - allopurinol Failed - 05/20/2023  1:37 PM      Failed - Uric Acid in normal range and within 360 days    Uric Acid, Serum  Date Value Ref Range Status  02/19/2019 5.7 4.0 - 8.0 mg/dL Final    Comment:    Therapeutic target for gout patients: <6.0 mg/dL .          Failed - Cr in normal range and within 360 days    Creat  Date Value Ref Range Status  07/30/2022 2.04 (H) 0.70 - 1.28 mg/dL Final   Creatinine, Ser  Date Value Ref Range Status  10/19/2022 2.11 (H) 0.61 - 1.24 mg/dL Final  Creatinine, Urine  Date Value Ref Range Status  07/30/2022 52 20 - 320 mg/dL Final         Passed - Valid encounter within last 12 months    Recent Outpatient Visits           4 weeks ago Lipoma of right axilla   [place] [facility] Pickard, Priscille Heidelberg, MD   2 months ago Thoracic radiculopathy   [place] Donita Brooks, MD   7 months ago Atypical chest pain   [facility] Donita Brooks, MD   9 months ago Colon cancer screening    [facility] Pickard, Priscille Heidelberg, MD              Passed - CBC within normal limits and completed in the last 12 months    WBC  Date Value Ref Range Status  10/19/2022 6.5 4.0 - 10.5 K/uL Final   RBC  Date Value Ref Range Status  10/19/2022 4.51 4.22 - 5.81 MIL/uL Final   Hemoglobin  Date Value Ref Range Status  10/19/2022 12.7 (L) 13.0 - 17.0 g/dL Final   HCT  Date Value Ref Range Status  10/19/2022 38.0 (L) 39.0 - 52.0 % Final   MCHC  Date  Value Ref Range Status  10/19/2022 33.4 30.0 - 36.0 g/dL Final   [facility]  Date Value Ref Range Status  10/19/2022 28.2 26.0 - 34.0 pg Final   MCV  Date Value Ref Range Status  10/19/2022 84.3 80.0 - 100.0 fL Final   No results found for: "PLTCOUNTKUC", "LABPLAT", "POCPLA" RDW  Date Value Ref Range Status  10/19/2022 14.2 11.5 - 15.5 % Final

## 2023-05-20 NOTE — Telephone Encounter (Signed)
 Requested Prescriptions  Pending Prescriptions Disp Refills   carvedilol (COREG) 25 MG tablet [Pharmacy Med Name: CARVEDILOL 25 MG TABLET] 180 tablet 0    Sig: TAKE 1 TABLET BY MOUTH TWICE A DAY     Cardiovascular: Beta Blockers 3 Failed - 05/20/2023  1:37 PM      Failed - Cr in normal range and within 360 days    Creat  Date Value Ref Range Status  07/30/2022 2.04 (H) 0.70 - 1.28 mg/dL Final   Creatinine, Ser  Date Value Ref Range Status  10/19/2022 2.11 (H) 0.61 - 1.24 mg/dL Final   Creatinine, Urine  Date Value Ref Range Status  07/30/2022 52 20 - 320 mg/dL Final         Failed - Last BP in normal range    BP Readings from Last 1 Encounters:  05/10/23 (!) 144/74         Passed - AST in normal range and within 360 days    AST  Date Value Ref Range Status  07/30/2022 29 10 - 35 U/L Final         Passed - ALT in normal range and within 360 days    ALT  Date Value Ref Range Status  07/30/2022 21 9 - 46 U/L Final         Passed - Last Heart Rate in normal range    Pulse Readings from Last 1 Encounters:  05/10/23 (!) 54         Passed - Valid encounter within last 6 months    Recent Outpatient Visits           4 weeks ago Lipoma of right axilla   [facility] Donita Brooks, MD   2 months ago Thoracic radiculopathy   [place] [facility] Donita Brooks, MD   7 months ago Atypical chest pain   [place] [facility] Donita Brooks, MD   9 months ago Colon cancer screening   [place] [facility] Pickard, Priscille Heidelberg, MD

## 2023-05-24 NOTE — Progress Notes (Signed)
 04/20/2023 Name: James Jones MRN: [id number] DOB: 1946/01/24  Chief Complaint  Patient presents with   Medication Assistance    James Jones    James Jones is a 78 y.o. year old male who presented for a telephone visit.   They were referred to the pharmacist by their PCP for assistance in managing medication access.    Subjective:  Care Team: Primary Care Provider: Donita Brooks, MD    Medication Access/Adherence  Current Pharmacy:  [facility] [phone] [place], [place] - [address] [place] [address] Phone: [phone] Fax: [phone]   Patient reports affordability concerns with their medications: Yes  Patient reports access/transportation concerns to their pharmacy: No  Patient reports adherence concerns with their medications:  Yes  cost   Diabetes:  Current medications: farxiga Medications tried in the past: glipizide, januvia, metformin, actos  Current glucose readings: FBG<130 Traditional glucometer  Patient denies hypoglycemic s/sx including dizziness, shakiness, sweating. Patient denies hyperglycemic symptoms including polyuria, polydipsia, polyphagia, nocturia, neuropathy, blurred vision.  Current meal patterns:  The patient is asked to make an attempt to improve diet and exercise patterns to aid in medical management of this problem.  Current physical activity: encouraged as able  Current medication access support: az&me--needs for farxiga assistance   Objective:  Lab Results  Component Value Date   HGBA1C 6.5 (A) 12/28/2022    Lab Results  Component Value Date   CREATININE 2.11 (H) 10/19/2022   BUN 20 10/19/2022   NA 139 10/19/2022   K 3.6 10/19/2022   CL 105 10/19/2022   CO2 22 10/19/2022    Lab Results  Component Value Date   CHOL 143 07/30/2022   HDL 59 07/30/2022   LDLCALC 66 07/30/2022   TRIG 92 07/30/2022   CHOLHDL 2.4 07/30/2022    Medications Reviewed Today      Reviewed by Danella Maiers, [facility] (Pharmacist) on 05/24/23 at 1610  Med List Status: <None>   Medication Order Taking? Sig Documenting Provider Last Dose Status Informant  albuterol (VENTOLIN HFA) 108 (90 Base) MCG/ACT inhaler [id number] No Inhale 2 puffs into the lungs every 6 (six) hours as needed for wheezing or shortness of breath. Omar Person, MD Taking Active Self           Med Note Merlene Morse, [facility]   Tue Oct 19, 2022 12:49 PM) prn  allopurinol (ZYLOPRIM) 100 MG tablet [id number]  TAKE 1 TABLET BY MOUTH EVERY DAY Donita Brooks, MD  Active   amLODipine (NORVASC) 10 MG tablet [id number] No Take 1 tablet (10 mg total) by mouth daily. Donita Brooks, MD Taking Active   aspirin EC 81 MG tablet [id number] No Take 1 tablet (81 mg total) by mouth daily. Swallow whole. Alver Sorrow, NP Taking Active Self  Blood Glucose Monitoring Suppl (FREESTYLE LITE) DEVI [id number] No Use to check blood sugar once daily Carlus Pavlov, MD Taking Active Self  carvedilol (COREG) 25 MG tablet [id number]  TAKE 1 TABLET BY MOUTH TWICE A DAY Donita Brooks, MD  Active   Cholecalciferol (VITAMIN D-3) 1000 UNITS CAPS [id number] No Take by mouth daily. 2 capsules. [provider] Taking Active Self           Med Note Madilyn Fireman, Karel Jarvis D   Thu Jul 31, 2015  2:06 PM)    dapagliflozin propanediol (FARXIGA) 10 MG TABS tablet [id number] No Take 1 tablet (10 mg total) by mouth daily before  breakfast. Austine Lefort, MD Taking Active   doxazosin (CARDURA) 8 MG tablet [id number] No TAKE 1 TABLET (8 MG TOTAL) BY MOUTH 2 (TWO) TIMES DAILY. Clearnce Curia, NP Taking Active   famotidine (PEPCID) 20 MG tablet [id number] No TAKE 1 TABLET BY MOUTH EVERYDAY AT BEDTIME Walker, Caitlin S, NP Taking Active   ferrous sulfate 325 (65 FE) MG tablet [id number] No Take 1 tablet (325 mg total) by mouth 2 (two) times daily with a meal. Austine Lefort, MD Taking Active Self  fish oil-omega-3 fatty acids 1000 MG  capsule [id number] No Take 1,200 mg by mouth daily.  [provider] Taking Active Self  FREESTYLE LITE test strip [id number] No USE TO CHECK BLOOD SUGAR 1-2X DAILY. E11.59 Emilie Harden, MD Taking Active   Multiple Vitamin (MULTIVITAMIN WITH MINERALS) TABS [id number] No Take 1 tablet by mouth daily. [provider] Taking Active Self  nitroGLYCERIN (NITROSTAT) 0.4 MG SL tablet [id number] No Place 1 tablet (0.4 mg total) under the tongue every 5 (five) minutes as needed for chest pain. Clearnce Curia, NP Taking Expired 02/28/23 2359   pantoprazole (PROTONIX) 40 MG tablet [id number] No TAKE 1 TABLET BY MOUTH EVERY DAY Walker, Caitlin S, NP Taking Active   PRESCRIPTION MEDICATION [id number] No Uses C-PAP at bedtime [provider] Taking Active Self  rosuvastatin (CRESTOR) 40 MG tablet [id number]  TAKE 1 TABLET BY MOUTH EVERY DAY Austine Lefort, MD  Active   sodium bicarbonate 650 MG tablet [id number] No Take 650 mg by mouth daily. [provider] Taking Active Self  umeclidinium-vilanterol (ANORO ELLIPTA) 62.5-25 MCG/ACT AEPB [id number] No Inhale 1 puff into the lungs daily. Gloriajean Large, MD Taking Active Self           Med Note Andee Kato, [facility]   Tue Oct 19, 2022 12:51 PM) prn  valsartan (DIOVAN) 320 MG tablet [id number]  Take 1 tablet (320 mg total) by mouth daily. Austine Lefort, MD  Active   Med List Note Bebe Bourdon, RN 10/16/13 1353): Patient is using C-PAP MACHINE              Assessment/Plan:   Diabetes: - Currently controlled - Reviewed long term cardiovascular and renal outcomes of uncontrolled blood sugar - Reviewed goal A1c, goal fasting, and goal 2 hour post prandial glucose - Recommend to continue Farxiga  - Recommend to check glucose daily (fasting) or if symptomatic - Meets financial criteria for farxiga patient assistance program through az&me. Will collaborate with provider, CPhT, and patient to pursue assistance.      Follow Up Plan: as needed   Debera Sterba Dattero Lindwood Mogel, PharmD, BCACP, CPP Clinical Pharmacist, [facility]

## 2023-05-31 ENCOUNTER — Encounter: Payer: Self-pay | Admitting: Internal Medicine

## 2023-05-31 ENCOUNTER — Ambulatory Visit (INDEPENDENT_AMBULATORY_CARE_PROVIDER_SITE_OTHER): Payer: Medicare Other | Admitting: Internal Medicine

## 2023-05-31 VITALS — BP 122/70 | HR 54 | Ht 68.0 in | Wt 200.6 lb

## 2023-05-31 DIAGNOSIS — E66811 Obesity, class 1: Secondary | ICD-10-CM | POA: Diagnosis not present

## 2023-05-31 DIAGNOSIS — E1159 Type 2 diabetes mellitus with other circulatory complications: Secondary | ICD-10-CM | POA: Diagnosis not present

## 2023-05-31 DIAGNOSIS — N1832 Chronic kidney disease, stage 3b: Secondary | ICD-10-CM | POA: Diagnosis not present

## 2023-05-31 DIAGNOSIS — E785 Hyperlipidemia, unspecified: Secondary | ICD-10-CM

## 2023-05-31 DIAGNOSIS — Z6830 Body mass index (BMI) 30.0-30.9, adult: Secondary | ICD-10-CM | POA: Diagnosis not present

## 2023-05-31 DIAGNOSIS — Z7984 Long term (current) use of oral hypoglycemic drugs: Secondary | ICD-10-CM | POA: Diagnosis not present

## 2023-05-31 LAB — POCT GLYCOSYLATED HEMOGLOBIN (HGB A1C): Hemoglobin A1C: 6.5 % — AB (ref 4.0–5.6)

## 2023-05-31 NOTE — Patient Instructions (Signed)
Please continue: - Farxiga 10 mg before b'fast.  Please return in 4-6 months with your sugar log.

## 2023-05-31 NOTE — Progress Notes (Signed)
 Patient ID: James Jones, male   DOB: 29-Apr-1945, 78 y.o.   MRN: [id number]  HPI: James Jones is a 78 y.o.-year-old male, returning for f/u for DM2, dx in ~2005, non-insulin -dependent, lately more controlled, with complications (CAD, CKD). Last visit 5 months ago. PCP: Dr. Eliane Grooms  Interim history: No blurry vision, nausea, chest pain.  He continues to have R shoulder pain. He was started on Kerendia 2 weeks ago. Trying to get PAP b/c it is very expensive for him.  Reviewed HbA1c levels: Lab Results  Component Value Date   HGBA1C 6.5 (A) 12/28/2022   HGBA1C 6.6 (H) 07/30/2022   HGBA1C 6.9 (A) 04/22/2022  04/11/2014: HbA1c 8.4% 01/08/2014: HbA1c 7.7% He had steroid inj for gout in his elbows - 12/2013. He also had a steroid inj in knee fall 2016, too.   He is on: - Farxiga  10 mg daily - very expensive (600$ for 3 mo >> cheaper: 464$) >> now will get this through PAP We had to stop Januvia  since last visit due to price.  Tradjenta  was also not covered. Previously on low-dose Metformin  ER, but stopped 07/2018 due to CKD Had nausea, vomiting, loss of appetite with regular metformin .  He was on Actos . He was on insulin  before (Lantus) - came off years ago. We stopped glipizide  ER 2.5 mg daily in 09/2021.  Pt checks his sugars 1-2 times a day,: - am: 106-123 >> 105-147, 161 (steroid) >> 96-120 >> 108-118 - 2h after b'fast:119, 133 >> n/c >> 118 >> n/c >> 127 >> 153 - before lunch:  89-134 >> 227  (steroid)  >> 118, 120 >> 85-117, 133 - 2h after lunch: 102-149 >> 120 >> 137-157 >> n/c - before dinner:  87, 109, 190 (steroid) >> 94-134 >> 79, 104 - 2h after dinner:  n/c >> 187 >> n/c >> 190 >> 226 (sweets) - bedtime: 88, 172 >> 52, 122 >> 113 >> 113, 154 - nighttime: 122 >> n/c >> 174 >> n/c Lowest 88 >> 52 x1 >> 94 >> 79; he has hypoglycemia awareness in the 70s. Highest sugar was 227 (Prednisone ) >> 190 (pasta) >> 226 (juice)  Glucometer: Freestyle [employer] did not cover  Freestyle Libre CGM.  Pt's meals are: - Breakfast: bacon + eggs, sausage, grits, oatmeal, cereal, toast wheat - Lunch: BLT, soups, fruit, nuts - Dinner: chicken, pork + greens, rice  - Snacks: 1 a day: pretzels; carrots  Previously exercising by walking several times a week, but stopped exercising due to the coronavirus pandemic. Walking 6 mi MWF at the [facility].  -+ CKD-sees nephrology (Dr. Shaune Delaine) (renal ultrasound showed renal cysts), latest BUN/creatinine:  Lab Results  Component Value Date   BUN 20 10/19/2022   CREATININE 2.11 (H) 10/19/2022  On valsartan .  -+ HL; last set of lipids: Lab Results  Component Value Date   CHOL 143 07/30/2022   HDL 59 07/30/2022   LDLCALC 66 07/30/2022   TRIG 92 07/30/2022   CHOLHDL 2.4 07/30/2022  On rosuvastatin  40, fish oil.  He still has some muscle aches, improved.  He had more muscle aches when he was on pravastatin  40 before.  - last eye exam was 05/2023: No DR reportedly.  He has history of cataract surgery in 2016 and 17.   - no numbness and tingling in his feet. + Occasional pain in hands and feet. Last foot exam 08/10/2022.  Latest TSH was normal: Lab Results  Component Value Date   TSH 1.740 10/02/2020  ROS: + see HPI  I reviewed pt's medications, allergies, PMH, social hx, family hx, and changes were documented in the history of present illness. Otherwise, unchanged from my initial visit note.  Past Medical History:  Diagnosis Date   Aortic stenosis 04/02/2021   Chronic diastolic heart failure (HCC)    CKD (chronic kidney disease) stage 2, GFR 60-89 ml/min 03/17/2012   Coronary artery disease, non-occlusive 03/18/2012   60-70% stenosis in RCA -- FFR 0.84; EF 60-65%   Diabetes mellitus without complication (HCC)    GERD (gastroesophageal reflux disease)    On PPI   History of GI bleed 02/2013   No obvious findings on EGD/colonoscopy   Hydrocele, bilateral    large on US  2020   Hyperlipidemia    Hypertension     Obesity (BMI 30.0-34.9) 10/02/2012   OSA (obstructive sleep apnea) 03/18/2012   Doing better with CPAP   Pulmonary hypertension (HCC) 12/10/2010   ECHO:  Mild PH,mild LVH; PA pressures estimated 30-40 mmHg   Pulmonary hypertension, unspecified (HCC) 06/04/2021   RBBB, 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   Event monitor; usually during sleeping hours; therefore not on beta blocker  + Gout  Past Surgical History:  Procedure Laterality Date   APPENDECTOMY  1974   CARPAL TUNNEL RELEASE     EYE SURGERY  1960   Left eye   HEMORRHOID SURGERY     LEFT HEART CATH AND CORONARY ANGIOGRAPHY  03/27/2010   Moderate mid RCA lesion,right radial approach,normal EF   LEFT HEART CATHETERIZATION WITH CORONARY ANGIOGRAM N/A 03/17/2012   Procedure: LEFT HEART CATHETERIZATION WITH CORONARY ANGIOGRAM;  Surgeon: Arleen Lacer, MD;  Location: [facility];  Service: Cardiovascular: Fractional Flow Reserve Measurement of mid RCA 60-70% stenosis = 0.84. Otherwise mild LCA CAD.  Normal EF & EDP   SHOULDER ARTHROSCOPY  2006   TRANSTHORACIC ECHOCARDIOGRAM  12/2010    Mild concentric LVH.  EF> 55%.  GR 1 DD.  Mild aortic sclerosis no stenosis.  PA pressures estimated 30-40 mmHg   History   Social History   Marital Status: Married    Spouse Name: N/A   Occupational History   retired   Social History Main Topics   Smoking status: Former Smoker -- 2.00 packs/day    Quit date: 04/12/2012   Smokeless tobacco: Never Used   Alcohol Use: 0.5 oz/week    1 drink(s) per week   Drug Use: Yes    Special: Marijuana     Comment: cannibus   Social History Narrative   Married, father of 2, grandfather of 3.   He is a former smoker of about a pack to pack and half cigarettes a day -- he quit in March of this year.    He is an avid exerciser working at least 4-5 days a week doing her walking or stationary bike.   Current Outpatient Medications on File Prior to Visit  Medication Sig Dispense Refill    albuterol  (VENTOLIN  HFA) 108 (90 Base) MCG/ACT inhaler Inhale 2 puffs into the lungs every 6 (six) hours as needed for wheezing or shortness of breath. 8 g 6   allopurinol  (ZYLOPRIM ) 100 MG tablet TAKE 1 TABLET BY MOUTH EVERY DAY 90 tablet 0   amLODipine  (NORVASC ) 10 MG tablet Take 1 tablet (10 mg total) by mouth daily. 90 tablet 1   aspirin  EC 81 MG tablet Take 1 tablet (81 mg total) by mouth daily. Swallow whole. 90 tablet 3   Blood Glucose Monitoring  Suppl (FREESTYLE LITE) DEVI Use to check blood sugar once daily 1 each 0   carvedilol  (COREG ) 25 MG tablet TAKE 1 TABLET BY MOUTH TWICE A DAY 180 tablet 0   Cholecalciferol (VITAMIN D-3) 1000 UNITS CAPS Take by mouth daily. 2 capsules.     dapagliflozin  propanediol (FARXIGA ) 10 MG TABS tablet Take 1 tablet (10 mg total) by mouth daily before breakfast. 90 tablet 4   doxazosin  (CARDURA ) 8 MG tablet TAKE 1 TABLET (8 MG TOTAL) BY MOUTH 2 (TWO) TIMES DAILY. 180 tablet 1   famotidine  (PEPCID ) 20 MG tablet TAKE 1 TABLET BY MOUTH EVERYDAY AT BEDTIME 90 tablet 1   ferrous sulfate  325 (65 FE) MG tablet Take 1 tablet (325 mg total) by mouth 2 (two) times daily with a meal. 180 tablet 3   fish oil-omega-3 fatty acids  1000 MG capsule Take 1,200 mg by mouth daily.      FREESTYLE LITE test strip USE TO CHECK BLOOD SUGAR 1-2X DAILY. E11.59 100 strip 1   Multiple Vitamin (MULTIVITAMIN WITH MINERALS) TABS Take 1 tablet by mouth daily.     nitroGLYCERIN  (NITROSTAT ) 0.4 MG SL tablet Place 1 tablet (0.4 mg total) under the tongue every 5 (five) minutes as needed for chest pain. 25 tablet 4   pantoprazole  (PROTONIX ) 40 MG tablet TAKE 1 TABLET BY MOUTH EVERY DAY 90 tablet 1   PRESCRIPTION MEDICATION Uses C-PAP at bedtime     rosuvastatin  (CRESTOR ) 40 MG tablet TAKE 1 TABLET BY MOUTH EVERY DAY 90 tablet 0   sodium bicarbonate 650 MG tablet Take 650 mg by mouth daily.     umeclidinium-vilanterol (ANORO ELLIPTA ) 62.5-25 MCG/ACT AEPB Inhale 1 puff into the lungs daily. 7  each 0   valsartan  (DIOVAN ) 320 MG tablet Take 1 tablet (320 mg total) by mouth daily. 90 tablet 0   No current facility-administered medications on file prior to visit.   Allergies  Allergen Reactions   Atorvastatin      myalgias   Family History  Problem Relation Age of Onset   Hypertension Mother    Kidney failure Mother    Coronary artery disease Father    Hypertension Sister    Kidney failure Sister    Stroke Maternal Grandmother    PE: BP 122/70   Pulse (!) 54   Ht 5\' 8"  (1.727 m)   Wt 200 lb 9.6 oz (91 kg)   SpO2 97%   BMI 30.50 kg/m   Wt Readings from Last 10 Encounters:  05/31/23 200 lb 9.6 oz (91 kg)  05/10/23 204 lb (92.5 kg)  04/21/23 200 lb (90.7 kg)  02/22/23 201 lb (91.2 kg)  01/11/23 196 lb 9.6 oz (89.2 kg)  12/28/22 199 lb (90.3 kg)  11/30/22 201 lb (91.2 kg)  10/19/22 197 lb 3.2 oz (89.4 kg)  10/18/22 196 lb (88.9 kg)  08/24/22 192 lb 9.6 oz (87.4 kg)   Constitutional: overweight, in NAD Eyes: EOMI, no exophthalmos ENT: no thyromegaly, no cervical lymphadenopathy Cardiovascular: RRR, No RG, + 2/6 SEM Respiratory: CTA B Musculoskeletal: no deformities Skin: no rashes Neurological: no tremor with outstretched hands Diabetic Foot Exam - Simple   Simple Foot Form Diabetic Foot exam was performed with the following findings: Yes 05/31/2023  8:30 AM  Visual Inspection No deformities, no ulcerations, no other skin breakdown bilaterally: Yes Sensation Testing Intact to touch and monofilament testing bilaterally: Yes Pulse Check Posterior Tibialis and Dorsalis pulse intact bilaterally: Yes Comments    ASSESSMENT: 1. DM2, non-insulin -dependent, uncontrolled,  with complications - CAD - cardiologist Dr. Addie Holstein - CKD - was seeing Dr Lucia Russian >> Dr. Shaune Delaine  2. HL  3.  Obesity class I  PLAN:  1. Patient with previously uncontrolled type 2 diabetes, with much improved control in the last few years.  He was previously on a DPP 4 inhibitor and  low-dose sulfonylurea, but currently doing well on an SGLT2 inhibitor.  He is obtaining this from the patient assistance program now.  At last visit, sugars were at goal per review of his detailed log and his HbA1c was lower, at 6.5%, so we did not change his regimen -At today's visit, almost all of his blood sugars are at goal.  He had hypoglycemic value in the 200s after drinking cranberry juice.  We discussed about stopping any type of juice.  Otherwise, with blood sugars at goal, I did not recommend a change in regimen. - I suggested to:  Patient Instructions  Please continue: - Farxiga  10 mg before b'fast.  Please return in 4-6 months with your sugar log.   - we checked his HbA1c: 6.5% (stable) - advised to check sugars at different times of the day - 1x a day, rotating check times - advised for yearly eye exams >> he is UTD - return to clinic in 4-6 months  2. HL -Reviewed latest lipid panel from 07/2022: LDL slightly higher than our goal of less than 55 due to cardiovascular disease, otherwise fractions at goal: Lab Results  Component Value Date   CHOL 143 07/30/2022   HDL 59 07/30/2022   LDLCALC 66 07/30/2022   TRIG 92 07/30/2022   CHOLHDL 2.4 07/30/2022  -He had myalgias on atorvastatin  and also on rosuvastatin , but he tolerates well Crestor  40 mg daily  3. Obesity class I - Will Farxiga  which should also help with weight loss -We discussed in the past about cutting down fatty foods - He gained 1 pound since last visit  Emilie Harden, MD PhD [facility] Endocr[facility]

## 2023-06-07 ENCOUNTER — Other Ambulatory Visit: Payer: Self-pay | Admitting: Internal Medicine

## 2023-06-16 ENCOUNTER — Encounter: Payer: Self-pay | Admitting: Family Medicine

## 2023-06-16 ENCOUNTER — Ambulatory Visit: Admitting: Family Medicine

## 2023-06-16 VITALS — BP 120/58 | HR 64 | Temp 98.1°F | Ht 68.0 in | Wt 198.4 lb

## 2023-06-16 DIAGNOSIS — M4814 Ankylosing hyperostosis [Forestier], thoracic region: Secondary | ICD-10-CM

## 2023-06-16 MED ORDER — DOXAZOSIN MESYLATE 4 MG PO TABS: 4.0000 mg | ORAL_TABLET | Freq: Every day | ORAL | 3 refills | Status: DC

## 2023-06-16 NOTE — Progress Notes (Signed)
 Subjective:    Patient ID: James Jones, male    DOB: 06-22-1945, 78 y.o.   MRN: [id number]  Back Pain  Dizziness  02/2023 Patient reports 3 to 4 weeks of pain.  The pain starts near his shoulder blade and then radiates around through his mid axillary line into his right chest just below his right nipple.  It stings and burns.  He describes it as a stinging needlelike pain.  There is no specific cause.  He denies any falls or injuries.  It does not hurt to take a deep breath then.  He denies any cough.  He denies any hemoptysis.  There is no tenderness to palpation on the ribs.  Simply touching skin does not elicit any pain.  There is no erythema or rash to suggest shingles.  The patient denies any fevers or chills.  At that time, my plan was: Symptoms sound consistent with thoracic radiculopathy.  There is no evidence of shingles.  I suspect the degenerative disc could be pressing on the nerve.  Recommend getting x-rays of the thoracic spine.  Also recommended getting a chest x-ray.  There are no specific causes or exacerbating or any factors.  Patient states the pain is worse at night when he is lying down.     Ultimately MRI showed: Generalized bulky spondylitic spurring especially at T1-2 and T8-9. Multiple osteophytes cause thoracic ankylosis. Small superimposed disc protrusions at T6-7 and T8-9. At T8-9 there is a left paracentral protrusion with endplate and facet spurring causing foraminal impingement.  06/16/23 Patient continues to report constant pain in the center of his back roughly around the level of T8-T9.  He reports pins-and-needles and burning neuropathic pain radiating around his chest following the path of his ribs towards his sternum.  He seems to be dealing with thoracic radiculopathy and constant mid back pain.  He also dizziness upon standing.  He states that when he stands up he feels like he is going to pass out.  Every time he bends over and stands up he feels  lightheaded and woozy.  He denies vertigo.  He is currently on doxazosin  8 mg twice daily.  He states that his cardiologist directed him to increase it to twice daily   Past Medical History:  Diagnosis Date   Aortic stenosis 04/02/2021   Chronic diastolic heart failure (HCC)    CKD (chronic kidney disease) stage 2, GFR 60-89 ml/min 03/17/2012   Coronary artery disease, non-occlusive 03/18/2012   60-70% stenosis in RCA -- FFR 0.84; EF 60-65%   Diabetes mellitus without complication (HCC)    GERD (gastroesophageal reflux disease)    On PPI   History of GI bleed 02/2013   No obvious findings on EGD/colonoscopy   Hydrocele, bilateral    large on US  2020   Hyperlipidemia    Hypertension    Obesity (BMI 30.0-34.9) 10/02/2012   OSA (obstructive sleep apnea) 03/18/2012   Doing better with CPAP   Pulmonary hypertension (HCC) 12/10/2010   ECHO:  Mild PH,mild LVH; PA pressures estimated 30-40 mmHg   Pulmonary hypertension, unspecified (HCC) 06/04/2021   RBBB, 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   Event monitor; usually during sleeping hours; therefore not on beta blocker   Past Surgical History:  Procedure Laterality Date   APPENDECTOMY  1974   CARPAL TUNNEL RELEASE     EYE SURGERY  1960   Left eye   HEMORRHOID SURGERY     LEFT HEART CATH AND CORONARY ANGIOGRAPHY  03/27/2010   Moderate mid RCA lesion,right radial approach,normal EF   LEFT HEART CATHETERIZATION WITH CORONARY ANGIOGRAM N/A 03/17/2012   Procedure: LEFT HEART CATHETERIZATION WITH CORONARY ANGIOGRAM;  Surgeon: Arleen Lacer, MD;  Location: [facility] CATH LAB;  Service: Cardiovascular: Fractional Flow Reserve Measurement of mid RCA 60-70% stenosis = 0.84. Otherwise mild LCA CAD.  Normal EF & EDP   SHOULDER ARTHROSCOPY  2006   TRANSTHORACIC ECHOCARDIOGRAM  12/2010    Mild concentric LVH.  EF> 55%.  GR 1 DD.  Mild aortic sclerosis no stenosis.  PA pressures estimated 30-40 mmHg   Current Outpatient Medications on File  Prior to Visit  Medication Sig Dispense Refill   albuterol  (VENTOLIN  HFA) 108 (90 Base) MCG/ACT inhaler Inhale 2 puffs into the lungs every 6 (six) hours as needed for wheezing or shortness of breath. 8 g 6   allopurinol  (ZYLOPRIM ) 100 MG tablet TAKE 1 TABLET BY MOUTH EVERY DAY 90 tablet 0   amLODipine  (NORVASC ) 10 MG tablet Take 1 tablet (10 mg total) by mouth daily. 90 tablet 1   aspirin  EC 81 MG tablet Take 1 tablet (81 mg total) by mouth daily. Swallow whole. 90 tablet 3   Blood Glucose Monitoring Suppl (FREESTYLE LITE) DEVI Use to check blood sugar once daily 1 each 0   carvedilol  (COREG ) 25 MG tablet TAKE 1 TABLET BY MOUTH TWICE A DAY 180 tablet 0   Cholecalciferol (VITAMIN D-3) 1000 UNITS CAPS Take by mouth daily. 2 capsules.     dapagliflozin  propanediol (FARXIGA ) 10 MG TABS tablet Take 1 tablet (10 mg total) by mouth daily before breakfast. 90 tablet 4   famotidine  (PEPCID ) 20 MG tablet TAKE 1 TABLET BY MOUTH EVERYDAY AT BEDTIME 90 tablet 1   ferrous sulfate  325 (65 FE) MG tablet Take 1 tablet (325 mg total) by mouth 2 (two) times daily with a meal. 180 tablet 3   fish oil-omega-3 fatty acids  1000 MG capsule Take 1,200 mg by mouth daily.      FREESTYLE LITE test strip USE TO CHECK BLOOD SUGAR 1-2X DAILY. E11.59 100 strip 1   KERENDIA 10 MG TABS      Multiple Vitamin (MULTIVITAMIN WITH MINERALS) TABS Take 1 tablet by mouth daily.     pantoprazole  (PROTONIX ) 40 MG tablet TAKE 1 TABLET BY MOUTH EVERY DAY 90 tablet 1   PRESCRIPTION MEDICATION Uses C-PAP at bedtime     rosuvastatin  (CRESTOR ) 40 MG tablet TAKE 1 TABLET BY MOUTH EVERY DAY 90 tablet 0   sodium bicarbonate 650 MG tablet Take 650 mg by mouth daily.     umeclidinium-vilanterol (ANORO ELLIPTA ) 62.5-25 MCG/ACT AEPB Inhale 1 puff into the lungs daily. 7 each 0   valsartan  (DIOVAN ) 320 MG tablet Take 1 tablet (320 mg total) by mouth daily. 90 tablet 0   nitroGLYCERIN  (NITROSTAT ) 0.4 MG SL tablet Place 1 tablet (0.4 mg total) under  the tongue every 5 (five) minutes as needed for chest pain. 25 tablet 4   No current facility-administered medications on file prior to visit.   Allergies  Allergen Reactions   Atorvastatin      myalgias   Social History   Socioeconomic History   Marital status: Married    Spouse name: Not on file   Number of children: Not on file   Years of education: Not on file   Highest education level: Not on file  Occupational History   Not on file  Tobacco Use   Smoking status: Former  Current packs/day: 0.00    Average packs/day: 2.0 packs/day for 50.0 years (100.0 ttl pk-yrs)    Types: Cigarettes    Start date: 10/10/1970    Quit date: 10/09/2020    Years since quitting: 2.6   Smokeless tobacco: Never  Vaping Use   Vaping status: Never Used  Substance and Sexual Activity   Alcohol use: Yes    Alcohol/week: 1.0 standard drink of alcohol    Types: 1 Standard drinks or equivalent per week   Drug use: Yes    Types: Marijuana    Comment: cannibus, occ use   Sexual activity: Not Currently  Other Topics Concern   Not on file  Social History Narrative   Married, father of 2, grandfather of 3.   He is a former smoker of about a pack to pack and half cigarettes a day -- he quit in March of this year.    He is an avid exerciser working at least 4-5 days a week doing her walking or stationary bike.   Social Drivers of Corporate investment banker Strain: Low Risk  (05/05/2023)   Overall Financial Resource Strain (CARDIA)    Difficulty of Paying Living Expenses: Not hard at all  Food Insecurity: No Food Insecurity (05/05/2023)   Hunger Vital Sign    Worried About Running Out of Food in the Last Year: Never true    Ran Out of Food in the Last Year: Never true  Transportation Needs: No Transportation Needs (05/05/2023)   PRAPARE - Administrator, Civil Service (Medical): No    Lack of Transportation (Non-Medical): No  Physical Activity: Sufficiently Active (05/05/2023)    Exercise Vital Sign    Days of Exercise per Week: 5 days    Minutes of Exercise per Session: 40 min  Stress: No Stress Concern Present (05/05/2023)   [employer] of Occupational Health - Occupational Stress Questionnaire    Feeling of Stress : Only a little  Social Connections: Unknown (05/05/2023)   Social Connection and Isolation Panel [NHANES]    Frequency of Communication with Friends and Family: Patient declined    Frequency of Social Gatherings with Friends and Family: Twice a week    Attends Religious Services: More than 4 times per year    Active Member of [employer] or Organizations: Yes    Attends Banker Meetings: Patient declined    Marital Status: Married  Catering manager Violence: Not At Risk (05/05/2023)   Humiliation, Afraid, Rape, and Kick questionnaire    Fear of Current or Ex-Partner: No    Emotionally Abused: No    Physically Abused: No    Sexually Abused: No   Family History  Problem Relation Age of Onset   Hypertension Mother    Kidney failure Mother    Coronary artery disease Father    Hypertension Sister    Kidney failure Sister    Stroke Maternal Grandmother      Review of Systems  Musculoskeletal:  Positive for back pain.  Neurological:  Positive for dizziness.  All other systems reviewed and are negative.      Objective:   Physical Exam Vitals reviewed.  Constitutional:      General: He is not in acute distress.    Appearance: He is well-developed. He is not diaphoretic.  HENT:     Head: Normocephalic and atraumatic.     Right Ear: External ear normal.     Left Ear: External ear normal.  Nose: Nose normal.     Mouth/Throat:     Pharynx: No oropharyngeal exudate.  Eyes:     General: No scleral icterus.       Right eye: No discharge.        Left eye: No discharge.     Conjunctiva/sclera: Conjunctivae normal.     Pupils: Pupils are equal, round, and reactive to light.  Neck:     Thyroid : No thyromegaly.     Vascular:  No JVD.     Trachea: No tracheal deviation.  Cardiovascular:     Rate and Rhythm: Normal rate and regular rhythm.     Heart sounds: Murmur heard.     No friction rub. No gallop.  Pulmonary:     Effort: Pulmonary effort is normal. No respiratory distress.     Breath sounds: Normal breath sounds. No stridor. No wheezing or rales.    Chest:     Chest wall: No tenderness.    Abdominal:     General: Bowel sounds are normal. There is no distension.     Palpations: Abdomen is soft. There is no mass.     Tenderness: There is no abdominal tenderness. There is no guarding or rebound.  Musculoskeletal:        General: No tenderness or deformity. Normal range of motion.     Cervical back: Normal range of motion and neck supple.  Lymphadenopathy:     Cervical: No cervical adenopathy.  Skin:    General: Skin is warm.     Coloration: Skin is not pale.     Findings: No erythema or rash.  Neurological:     Mental Status: He is alert and oriented to person, place, and time.     Cranial Nerves: No cranial nerve deficit.     Motor: No abnormal muscle tone.     Coordination: Coordination normal.     Deep Tendon Reflexes: Reflexes are normal and symmetric.  Psychiatric:        Behavior: Behavior normal.        Thought Content: Thought content normal.        Judgment: Judgment normal.           Assessment & Plan:  Ankylosing hyperostosis (forestier), thoracic region - Plan: Ambulatory referral to Neurosurgery  I believe the orthostatic dizziness is due to hypotension and is most likely due to the doxazosin  as this is his biggest side effect.  I recommended reducing doxazosin  to 4 mg a day from the 8 mg twice daily that he has been taking.  Recheck blood pressure in 1 week and see how he is doing.  Patient has ankylosing spondylitic changes throughout the thoracic spine and I believe the hyperostosis is likely causing nerve impingement particularly at T8/T9.  I will refer the patient to  neurosurgery to see if they can perform epidural steroid injections in that area to alleviate his pain.

## 2023-06-24 ENCOUNTER — Encounter: Payer: Self-pay | Admitting: Family Medicine

## 2023-07-14 DIAGNOSIS — M47814 Spondylosis without myelopathy or radiculopathy, thoracic region: Secondary | ICD-10-CM | POA: Diagnosis not present

## 2023-07-14 DIAGNOSIS — M5416 Radiculopathy, lumbar region: Secondary | ICD-10-CM | POA: Diagnosis not present

## 2023-07-28 DIAGNOSIS — M48061 Spinal stenosis, lumbar region without neurogenic claudication: Secondary | ICD-10-CM | POA: Diagnosis not present

## 2023-07-28 DIAGNOSIS — M5126 Other intervertebral disc displacement, lumbar region: Secondary | ICD-10-CM | POA: Diagnosis not present

## 2023-07-28 DIAGNOSIS — M5137 Other intervertebral disc degeneration, lumbosacral region with discogenic back pain only: Secondary | ICD-10-CM | POA: Diagnosis not present

## 2023-07-28 DIAGNOSIS — M47816 Spondylosis without myelopathy or radiculopathy, lumbar region: Secondary | ICD-10-CM | POA: Diagnosis not present

## 2023-07-28 DIAGNOSIS — M5416 Radiculopathy, lumbar region: Secondary | ICD-10-CM | POA: Diagnosis not present

## 2023-08-08 ENCOUNTER — Other Ambulatory Visit

## 2023-08-11 ENCOUNTER — Encounter: Admitting: Family Medicine

## 2023-08-17 ENCOUNTER — Other Ambulatory Visit

## 2023-08-17 DIAGNOSIS — I10 Essential (primary) hypertension: Secondary | ICD-10-CM

## 2023-08-17 DIAGNOSIS — N183 Chronic kidney disease, stage 3 unspecified: Secondary | ICD-10-CM | POA: Diagnosis not present

## 2023-08-17 DIAGNOSIS — E1159 Type 2 diabetes mellitus with other circulatory complications: Secondary | ICD-10-CM | POA: Diagnosis not present

## 2023-08-17 DIAGNOSIS — E1122 Type 2 diabetes mellitus with diabetic chronic kidney disease: Secondary | ICD-10-CM | POA: Diagnosis not present

## 2023-08-18 ENCOUNTER — Ambulatory Visit: Payer: Self-pay | Admitting: Family Medicine

## 2023-08-18 LAB — CBC WITH DIFFERENTIAL/PLATELET
Absolute Lymphocytes: 952 {cells}/uL (ref 850–3900)
Absolute Monocytes: 511 {cells}/uL (ref 200–950)
Basophils Absolute: 42 {cells}/uL (ref 0–200)
Basophils Relative: 0.6 %
Eosinophils Absolute: 161 {cells}/uL (ref 15–500)
Eosinophils Relative: 2.3 %
HCT: 41.4 % (ref 38.5–50.0)
Hemoglobin: 12.9 g/dL — ABNORMAL LOW (ref 13.2–17.1)
MCH: 27.7 pg (ref 27.0–33.0)
MCHC: 31.2 g/dL — ABNORMAL LOW (ref 32.0–36.0)
MCV: 88.8 fL (ref 80.0–100.0)
MPV: 9.7 fL (ref 7.5–12.5)
Monocytes Relative: 7.3 %
Neutro Abs: 5334 {cells}/uL (ref 1500–7800)
Neutrophils Relative %: 76.2 %
Platelets: 204 Thousand/uL (ref 140–400)
RBC: 4.66 Million/uL (ref 4.20–5.80)
RDW: 14 % (ref 11.0–15.0)
Total Lymphocyte: 13.6 %
WBC: 7 Thousand/uL (ref 3.8–10.8)

## 2023-08-18 LAB — LIPID PANEL
Cholesterol: 136 mg/dL (ref <200)
HDL: 53 mg/dL (ref >=40)
LDL Cholesterol (Calc): 65 mg/dL
Non-HDL Cholesterol (Calc): 83 mg/dL (ref <130)
Total CHOL/HDL Ratio: 2.6 (calc) (ref <5.0)
Triglycerides: 92 mg/dL (ref <150)

## 2023-08-18 LAB — COMPLETE METABOLIC PANEL WITHOUT GFR
AG Ratio: 1.9 (calc) (ref 1.0–2.5)
ALT: 18 U/L (ref 9–46)
AST: 23 U/L (ref 10–35)
Albumin: 4 g/dL (ref 3.6–5.1)
Alkaline phosphatase (APISO): 82 U/L (ref 35–144)
BUN/Creatinine Ratio: 12 (calc) (ref 6–22)
BUN: 20 mg/dL (ref 7–25)
CO2: 24 mmol/L (ref 20–32)
Calcium: 8.9 mg/dL (ref 8.6–10.3)
Chloride: 108 mmol/L (ref 98–110)
Creat: 1.63 mg/dL — ABNORMAL HIGH (ref 0.70–1.28)
Globulin: 2.1 g/dL (ref 1.9–3.7)
Glucose, Bld: 123 mg/dL — ABNORMAL HIGH (ref 65–99)
Potassium: 3.5 mmol/L (ref 3.5–5.3)
Sodium: 141 mmol/L (ref 135–146)
Total Bilirubin: 0.4 mg/dL (ref 0.2–1.2)
Total Protein: 6.1 g/dL (ref 6.1–8.1)

## 2023-08-18 LAB — HEMOGLOBIN A1C
Hgb A1c MFr Bld: 6.9 % — ABNORMAL HIGH (ref <5.7)
Mean Plasma Glucose: 151 mg/dL
eAG (mmol/L): 8.4 mmol/L

## 2023-08-22 DIAGNOSIS — N1832 Chronic kidney disease, stage 3b: Secondary | ICD-10-CM | POA: Diagnosis not present

## 2023-08-23 ENCOUNTER — Encounter: Payer: Self-pay | Admitting: Family Medicine

## 2023-08-23 ENCOUNTER — Ambulatory Visit: Admitting: Family Medicine

## 2023-08-23 VITALS — BP 138/80 | HR 62 | Temp 97.6°F | Ht 68.0 in | Wt 196.8 lb

## 2023-08-23 DIAGNOSIS — R350 Frequency of micturition: Secondary | ICD-10-CM | POA: Diagnosis not present

## 2023-08-23 DIAGNOSIS — N401 Enlarged prostate with lower urinary tract symptoms: Secondary | ICD-10-CM

## 2023-08-23 DIAGNOSIS — I10 Essential (primary) hypertension: Secondary | ICD-10-CM

## 2023-08-23 DIAGNOSIS — E1159 Type 2 diabetes mellitus with other circulatory complications: Secondary | ICD-10-CM

## 2023-08-23 DIAGNOSIS — E1122 Type 2 diabetes mellitus with diabetic chronic kidney disease: Secondary | ICD-10-CM | POA: Diagnosis not present

## 2023-08-23 DIAGNOSIS — I2510 Atherosclerotic heart disease of native coronary artery without angina pectoris: Secondary | ICD-10-CM

## 2023-08-23 DIAGNOSIS — Z122 Encounter for screening for malignant neoplasm of respiratory organs: Secondary | ICD-10-CM

## 2023-08-23 DIAGNOSIS — Z87891 Personal history of nicotine dependence: Secondary | ICD-10-CM | POA: Diagnosis not present

## 2023-08-23 DIAGNOSIS — Z125 Encounter for screening for malignant neoplasm of prostate: Secondary | ICD-10-CM

## 2023-08-23 DIAGNOSIS — N183 Chronic kidney disease, stage 3 unspecified: Secondary | ICD-10-CM | POA: Diagnosis not present

## 2023-08-23 NOTE — Progress Notes (Signed)
 Subjective:    Patient ID: James Jones, male    DOB: [birth year], 78 y.o.   MRN: [id number]  HPI Patient is a very pleasant 78 year old African-American gentleman who is here today for his regular physical.  He has a past medical history of type 2 diabetes mellitus, stage 3 B CKD.  Echo 1/25 showed mild pulm hypertension and mild to moderate aortic stenosis but normal EF.  Last year, added kerendia to valsartan  and farxiga  due to GFR of 33 and albumin /cr of 1000.  Current hga1c is 6.9.  Creatinine has improved from 2 to 1.63.  However patient is not able to afford [place] and he had stopped the medication.  He is also unable to afford the Farxiga .  He is requesting assistance with this medication.  Cologuard last year was negative.  We discussed screening for prostate cancer.  I explained to the patient that screening is usually not recommended after age 80.  Patient would still like to get a PSA today.  He also is due for screening for lung cancer.  We discussed this today.  We tried to order the CT scan for the lung however his insurance stated that it was denied.  Per his policy it is not approved greater than 80  Lab on 08/17/2023  Component Date Value Ref Range Status   Glucose, Bld 08/17/2023 123 (H)  65 - 99 mg/dL Final   Comment: .            Fasting reference interval . For someone without known diabetes, a glucose value between 100 and 125 mg/dL is consistent with prediabetes and should be confirmed with a follow-up test. .    BUN 08/17/2023 20  7 - 25 mg/dL Final   Creat 92/90/7974 1.63 (H)  0.70 - 1.28 mg/dL Final   BUN/Creatinine Ratio 08/17/2023 12  6 - 22 (calc) Final   Sodium 08/17/2023 141  135 - 146 mmol/L Final   Potassium 08/17/2023 3.5  3.5 - 5.3 mmol/L Final   Chloride 08/17/2023 108  98 - 110 mmol/L Final   CO2 08/17/2023 24  20 - 32 mmol/L Final   Calcium  08/17/2023 8.9  8.6 - 10.3 mg/dL Final   Total Protein 92/90/7974 6.1  6.1 - 8.1 g/dL Final   Albumin   08/17/2023 4.0  3.6 - 5.1 g/dL Final   Globulin 92/90/7974 2.1  1.9 - 3.7 g/dL (calc) Final   AG Ratio 08/17/2023 1.9  1.0 - 2.5 (calc) Final   Total Bilirubin 08/17/2023 0.4  0.2 - 1.2 mg/dL Final   Alkaline phosphatase (APISO) 08/17/2023 82  35 - 144 U/L Final   AST 08/17/2023 23  10 - 35 U/L Final   ALT 08/17/2023 18  9 - 46 U/L Final   Cholesterol 08/17/2023 136  <200 mg/dL Final   HDL 92/90/7974 53  > OR = 40 mg/dL Final   Triglycerides 92/90/7974 92  <150 mg/dL Final   LDL Cholesterol (Calc) 08/17/2023 65  mg/dL (calc) Final   Comment: Reference range: <100 . Desirable range <100 mg/dL for primary prevention;   <70 mg/dL for patients with CHD or diabetic patients  with > or = 2 CHD risk factors. SABRA LDL-C is now calculated using the Martin-Hopkins  calculation, which is a validated novel method providing  better accuracy than the Friedewald equation in the  estimation of LDL-C.  Gladis APPLETHWAITE et al. SANDREA. 7986;689(80): 2061-2068  (http://education.[facility].com/faq/FAQ164)    Total CHOL/HDL Ratio 08/17/2023 2.6  <4.9 (calc) Final  Non-HDL Cholesterol (Calc) 08/17/2023 83  <130 mg/dL (calc) Final   Comment: For patients with diabetes plus 1 major ASCVD risk  factor, treating to a non-HDL-C goal of <100 mg/dL  (LDL-C of <29 mg/dL) is considered a therapeutic  option.    WBC 08/17/2023 7.0  3.8 - 10.8 Thousand/uL Final   RBC 08/17/2023 4.66  4.20 - 5.80 Million/uL Final   Hemoglobin 08/17/2023 12.9 (L)  13.2 - 17.1 g/dL Final   HCT 92/90/7974 41.4  38.5 - 50.0 % Final   MCV 08/17/2023 88.8  80.0 - 100.0 fL Final   MCH 08/17/2023 27.7  27.0 - 33.0 pg Final   MCHC 08/17/2023 31.2 (L)  32.0 - 36.0 g/dL Final   Comment: For adults, a slight decrease in the calculated MCHC value (in the range of 30 to 32 g/dL) is most likely not clinically significant; however, it should be interpreted with caution in correlation with other red cell parameters and the patient's  clinical condition.    RDW 08/17/2023 14.0  11.0 - 15.0 % Final   Platelets 08/17/2023 204  140 - 400 Thousand/uL Final   MPV 08/17/2023 9.7  7.5 - 12.5 fL Final   Neutro Abs 08/17/2023 5,334  1,500 - 7,800 cells/uL Final   Absolute Lymphocytes 08/17/2023 952  850 - 3,900 cells/uL Final   Absolute Monocytes 08/17/2023 511  200 - 950 cells/uL Final   Eosinophils Absolute 08/17/2023 161  15 - 500 cells/uL Final   Basophils Absolute 08/17/2023 42  0 - 200 cells/uL Final   Neutrophils Relative % 08/17/2023 76.2  % Final   Total Lymphocyte 08/17/2023 13.6  % Final   Monocytes Relative 08/17/2023 7.3  % Final   Eosinophils Relative 08/17/2023 2.3  % Final   Basophils Relative 08/17/2023 0.6  % Final   Hgb A1c MFr Bld 08/17/2023 6.9 (H)  <5.7 % Final   Comment: For someone without known diabetes, a hemoglobin A1c value of 6.5% or greater indicates that they may have  diabetes and this should be confirmed with a follow-up  test. . For someone with known diabetes, a value <7% indicates  that their diabetes is well controlled and a value  greater than or equal to 7% indicates suboptimal  control. A1c targets should be individualized based on  duration of diabetes, age, comorbid conditions, and  other considerations. . Currently, no consensus exists regarding use of hemoglobin A1c for diagnosis of diabetes for children. .    Mean Plasma Glucose 08/17/2023 151  mg/dL Final   eAG (mmol/L) 92/90/7974 8.4  mmol/L Final    Past Medical History:  Diagnosis Date   Aortic stenosis 04/02/2021   Chronic diastolic heart failure (HCC)    CKD (chronic kidney disease) stage 2, GFR 60-89 ml/min 03/17/2012   Coronary artery disease, non-occlusive 03/18/2012   60-70% stenosis in RCA -- FFR 0.84; EF 60-65%   Diabetes mellitus without complication (HCC)    GERD (gastroesophageal reflux disease)    On PPI   History of GI bleed 02/2013   No obvious findings on EGD/colonoscopy   Hydrocele, bilateral     large on US  2020   Hyperlipidemia    Hypertension    Obesity (BMI 30.0-34.9) 10/02/2012   OSA (obstructive sleep apnea) 03/18/2012   Doing better with CPAP   Pulmonary hypertension (HCC) 12/10/2010   ECHO:  Mild PH,mild LVH; PA pressures estimated 30-40 mmHg   Pulmonary hypertension, unspecified (HCC) 06/04/2021   RBBB, 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  Event monitor; usually during sleeping hours; therefore not on beta blocker   Past Surgical History:  Procedure Laterality Date   APPENDECTOMY  1974   CARPAL TUNNEL RELEASE     EYE SURGERY  1960   Left eye   HEMORRHOID SURGERY     LEFT HEART CATH AND CORONARY ANGIOGRAPHY  03/27/2010   Moderate mid RCA lesion,right radial approach,normal EF   LEFT HEART CATHETERIZATION WITH CORONARY ANGIOGRAM N/A 03/17/2012   Procedure: LEFT HEART CATHETERIZATION WITH CORONARY ANGIOGRAM;  Surgeon: Alm LELON Clay, MD;  Location: [facility] CATH LAB;  Service: Cardiovascular: Fractional Flow Reserve Measurement of mid RCA 60-70% stenosis = 0.84. Otherwise mild LCA CAD.  Normal EF & EDP   SHOULDER ARTHROSCOPY  2006   TRANSTHORACIC ECHOCARDIOGRAM  12/2010    Mild concentric LVH.  EF> 55%.  GR 1 DD.  Mild aortic sclerosis no stenosis.  PA pressures estimated 30-40 mmHg   Current Outpatient Medications on File Prior to Visit  Medication Sig Dispense Refill   albuterol  (VENTOLIN  HFA) 108 (90 Base) MCG/ACT inhaler Inhale 2 puffs into the lungs every 6 (six) hours as needed for wheezing or shortness of breath. 8 g 6   allopurinol  (ZYLOPRIM ) 100 MG tablet TAKE 1 TABLET BY MOUTH EVERY DAY 90 tablet 0   amLODipine  (NORVASC ) 10 MG tablet Take 1 tablet (10 mg total) by mouth daily. 90 tablet 1   aspirin  EC 81 MG tablet Take 1 tablet (81 mg total) by mouth daily. Swallow whole. 90 tablet 3   Blood Glucose Monitoring Suppl (FREESTYLE LITE) DEVI Use to check blood sugar once daily 1 each 0   carvedilol  (COREG ) 25 MG tablet TAKE 1 TABLET BY MOUTH TWICE A  DAY 180 tablet 0   Cholecalciferol (VITAMIN D-3) 1000 UNITS CAPS Take by mouth daily. 2 capsules.     dapagliflozin  propanediol (FARXIGA ) 10 MG TABS tablet Take 1 tablet (10 mg total) by mouth daily before breakfast. 90 tablet 4   doxazosin  (CARDURA ) 4 MG tablet Take 1 tablet (4 mg total) by mouth daily. 30 tablet 3   famotidine  (PEPCID ) 20 MG tablet TAKE 1 TABLET BY MOUTH EVERYDAY AT BEDTIME 90 tablet 1   ferrous sulfate  325 (65 FE) MG tablet Take 1 tablet (325 mg total) by mouth 2 (two) times daily with a meal. 180 tablet 3   fish oil-omega-3 fatty acids  1000 MG capsule Take 1,200 mg by mouth daily.      FREESTYLE LITE test strip USE TO CHECK BLOOD SUGAR 1-2X DAILY. E11.59 100 strip 1   KERENDIA 10 MG TABS      Multiple Vitamin (MULTIVITAMIN WITH MINERALS) TABS Take 1 tablet by mouth daily.     nitroGLYCERIN  (NITROSTAT ) 0.4 MG SL tablet Place 1 tablet (0.4 mg total) under the tongue every 5 (five) minutes as needed for chest pain. 25 tablet 4   pantoprazole  (PROTONIX ) 40 MG tablet TAKE 1 TABLET BY MOUTH EVERY DAY 90 tablet 1   PRESCRIPTION MEDICATION Uses C-PAP at bedtime     rosuvastatin  (CRESTOR ) 40 MG tablet TAKE 1 TABLET BY MOUTH EVERY DAY 90 tablet 0   sodium bicarbonate 650 MG tablet Take 650 mg by mouth daily.     umeclidinium-vilanterol (ANORO ELLIPTA ) 62.5-25 MCG/ACT AEPB Inhale 1 puff into the lungs daily. 7 each 0   valsartan  (DIOVAN ) 320 MG tablet Take 1 tablet (320 mg total) by mouth daily. 90 tablet 0   No current facility-administered medications on file prior to visit.   Allergies  Allergen Reactions   Atorvastatin      myalgias   Social History   Socioeconomic History   Marital status: Married    Spouse name: Not on file   Number of children: Not on file   Years of education: Not on file   Highest education level: Not on file  Occupational History   Not on file  Tobacco Use   Smoking status: Former    Current packs/day: 0.00    Average packs/day: 2.0 packs/day  for 50.0 years (100.0 ttl pk-yrs)    Types: Cigarettes    Start date: 10/10/1970    Quit date: 10/09/2020    Years since quitting: 2.8   Smokeless tobacco: Never  Vaping Use   Vaping status: Never Used  Substance and Sexual Activity   Alcohol use: Yes    Alcohol/week: 1.0 standard drink of alcohol    Types: 1 Standard drinks or equivalent per week   Drug use: Yes    Types: Marijuana    Comment: cannibus, occ use   Sexual activity: Not Currently  Other Topics Concern   Not on file  Social History Narrative   Married, father of 2, grandfather of 3.   He is a former smoker of about a pack to pack and half cigarettes a day -- he quit in March of this year.    He is an avid exerciser working at least 4-5 days a week doing her walking or stationary bike.   Social Drivers of Corporate investment banker Strain: Low Risk  (05/05/2023)   Overall Financial Resource Strain (CARDIA)    Difficulty of Paying Living Expenses: Not hard at all  Food Insecurity: No Food Insecurity (05/05/2023)   Hunger Vital Sign    Worried About Running Out of Food in the Last Year: Never true    Ran Out of Food in the Last Year: Never true  Transportation Needs: No Transportation Needs (05/05/2023)   PRAPARE - Administrator, Civil Service (Medical): No    Lack of Transportation (Non-Medical): No  Physical Activity: Sufficiently Active (05/05/2023)   Exercise Vital Sign    Days of Exercise per Week: 5 days    Minutes of Exercise per Session: 40 min  Stress: No Stress Concern Present (05/05/2023)   [employer] of Occupational Health - Occupational Stress Questionnaire    Feeling of Stress : Only a little  Social Connections: Unknown (05/05/2023)   Social Connection and Isolation Panel    Frequency of Communication with Friends and Family: Patient declined    Frequency of Social Gatherings with Friends and Family: Twice a week    Attends Religious Services: More than 4 times per year    Active  Member of [employer] or Organizations: Yes    Attends Banker Meetings: Patient declined    Marital Status: Married  Catering manager Violence: Not At Risk (05/05/2023)   Humiliation, Afraid, Rape, and Kick questionnaire    Fear of Current or Ex-Partner: No    Emotionally Abused: No    Physically Abused: No    Sexually Abused: No   Family History  Problem Relation Age of Onset   Hypertension Mother    Kidney failure Mother    Coronary artery disease Father    Hypertension Sister    Kidney failure Sister    Stroke Maternal Grandmother      Review of Systems  All other systems reviewed and are negative.      Objective:  Physical Exam Vitals reviewed.  Constitutional:      General: He is not in acute distress.    Appearance: He is well-developed. He is not diaphoretic.  HENT:     Head: Normocephalic and atraumatic.     Right Ear: External ear normal.     Left Ear: External ear normal.     Nose: Nose normal.     Mouth/Throat:     Pharynx: No oropharyngeal exudate.  Eyes:     General: No scleral icterus.       Right eye: No discharge.        Left eye: No discharge.     Conjunctiva/sclera: Conjunctivae normal.     Pupils: Pupils are equal, round, and reactive to light.  Neck:     Thyroid : No thyromegaly.     Vascular: No JVD.     Trachea: No tracheal deviation.  Cardiovascular:     Rate and Rhythm: Normal rate and regular rhythm.     Heart sounds: Murmur heard.     No friction rub. No gallop.  Pulmonary:     Effort: Pulmonary effort is normal. No respiratory distress.     Breath sounds: Normal breath sounds. No stridor. No wheezing or rales.  Chest:     Chest wall: No tenderness.  Abdominal:     General: Bowel sounds are normal. There is no distension.     Palpations: Abdomen is soft. There is no mass.     Tenderness: There is no abdominal tenderness. There is no guarding or rebound.  Musculoskeletal:        General: No tenderness or deformity. Normal  range of motion.     Cervical back: Normal range of motion and neck supple.  Lymphadenopathy:     Cervical: No cervical adenopathy.  Skin:    General: Skin is warm.     Coloration: Skin is not pale.     Findings: No erythema or rash.  Neurological:     Mental Status: He is alert and oriented to person, place, and time.     Cranial Nerves: No cranial nerve deficit.     Motor: No abnormal muscle tone.     Coordination: Coordination normal.     Deep Tendon Reflexes: Reflexes are normal and symmetric.  Psychiatric:        Behavior: Behavior normal.        Thought Content: Thought content normal.        Judgment: Judgment normal.           Assessment & Plan:  CKD stage 3 due to type 2 diabetes mellitus (HCC) - Plan: Microalbumin/Creatinine Ratio, Urine, Comprehensive metabolic panel with GFR, AMB Referral VBCI Care Management  Controlled type 2 diabetes mellitus with other circulatory complication, without long-term current use of insulin  (HCC) - Plan: AMB Referral VBCI Care Management  Essential hypertension  ASCVD (arteriosclerotic cardiovascular disease)  Former smoker  Screening for lung cancer  Screening for prostate cancer - Plan: PSA  Benign prostatic hyperplasia with urinary frequency - Plan: PSA Recommended the shingles vaccine which he declined.  Recommended a flu vaccine and COVID in the fall.  We discussed RSV vaccine.  Colonoscopy is not required due to age.  Cologuard last year was negative.  Check PSA to screen for prostate cancer.  Recommended lung CT to screen for lung cancer however his policy declined this.  Will refer the patient to care management to see if we can help provide assistance in paying for Farxiga .  Chronic kidney disease numbers have improved.  Best creatinine the patient is having years.  I will check a urine protein creatinine ratio.  LDL cholesterol is excellent.  Blood pressure is outstanding.  Hemoglobin A1c is acceptable at 6.9.

## 2023-08-24 ENCOUNTER — Telehealth: Payer: Self-pay

## 2023-08-24 LAB — MICROALBUMIN / CREATININE URINE RATIO
Creatinine, Urine: 43 mg/dL (ref 20–320)
Microalb Creat Ratio: 1842 mg/g{creat} — ABNORMAL HIGH (ref <30)
Microalb, Ur: 79.2 mg/dL

## 2023-08-24 LAB — COMPREHENSIVE METABOLIC PANEL WITH GFR
AG Ratio: 2 (calc) (ref 1.0–2.5)
ALT: 19 U/L (ref 9–46)
AST: 25 U/L (ref 10–35)
Albumin: 4.1 g/dL (ref 3.6–5.1)
Alkaline phosphatase (APISO): 81 U/L (ref 35–144)
BUN/Creatinine Ratio: 12 (calc) (ref 6–22)
BUN: 22 mg/dL (ref 7–25)
CO2: 22 mmol/L (ref 20–32)
Calcium: 8.9 mg/dL (ref 8.6–10.3)
Chloride: 107 mmol/L (ref 98–110)
Creat: 1.83 mg/dL — ABNORMAL HIGH (ref 0.70–1.28)
Globulin: 2.1 g/dL (ref 1.9–3.7)
Glucose, Bld: 113 mg/dL — ABNORMAL HIGH (ref 65–99)
Potassium: 3.6 mmol/L (ref 3.5–5.3)
Sodium: 141 mmol/L (ref 135–146)
Total Bilirubin: 0.3 mg/dL (ref 0.2–1.2)
Total Protein: 6.2 g/dL (ref 6.1–8.1)
eGFR: 37 mL/min/1.73m2 — ABNORMAL LOW (ref >=60)

## 2023-08-24 LAB — PSA: PSA: 1.07 ng/mL (ref <=4.00)

## 2023-08-24 NOTE — Progress Notes (Unsigned)
 Complex Care Management Note Care Guide Note  08/24/2023 Name: James Jones MRN: [id number] DOB: 1945/02/23   Complex Care Management Outreach Attempts: An unsuccessful telephone outreach was attempted today to offer the patient information about available complex care management services.  Follow Up Plan:  Additional outreach attempts will be made to offer the patient complex care management information and services.   Encounter Outcome:  No Answer  [facility], [facility] Guide  Direct Dial: [phone]  Fax [phone]

## 2023-08-25 ENCOUNTER — Ambulatory Visit: Payer: Self-pay | Admitting: Family Medicine

## 2023-08-25 DIAGNOSIS — M48062 Spinal stenosis, lumbar region with neurogenic claudication: Secondary | ICD-10-CM | POA: Diagnosis not present

## 2023-08-25 NOTE — Progress Notes (Unsigned)
 Complex Care Management Note Care Guide Note  08/25/2023 Name: James Jones MRN: [id number] DOB: Dec 31, 1945   Complex Care Management Outreach Attempts: A second unsuccessful outreach was attempted today to offer the patient with information about available complex care management services.  Follow Up Plan:  Additional outreach attempts will be made to offer the patient complex care management information and services.   Encounter Outcome:  No Answer  Leotis [facility], [facility] Guide  Direct Dial: [phone]  Fax [phone]

## 2023-08-26 NOTE — Progress Notes (Signed)
 Care Guide Pharmacy Note  08/26/2023 Name: Kippy Gohman MRN: [id number] DOB: 05-Oct-1945  Referred By: Duanne Mehlhoff DASEN, MD Reason for referral: Complex Care Management, Call Attempt #1 (Initial outreach to schedule with Berdine Ano), Call Attempt #2 (Unsuccessful outreach to schedule with Pharm D), and Call Attempt #3 (Initial Outreach scheduled with PHARM D Lang.)   Robson Trickey is a 78 y.o. year old male who is a primary care patient of Duanne Venturella DASEN, MD.  Masud Holub was referred to the pharmacist for assistance related to: Medication assistance  Successful contact was made with the patient to discuss pharmacy services including being ready for the pharmacist to call at least 5 minutes before the scheduled appointment time and to have medication bottles and any blood pressure readings ready for review. The patient agreed to meet with the pharmacist via telephone visit on (date/time). 09/08/23 @ 1 PM.   Leotis Rase [facility], [facility] Guide  Direct Dial: [phone]  Fax [phone]

## 2023-08-29 NOTE — Addendum Note (Signed)
 Addended by: DUANNE LOWERS T on: 08/29/2023 06:43 AM   Modules accepted: Level of Service

## 2023-08-30 ENCOUNTER — Other Ambulatory Visit: Payer: Self-pay | Admitting: Family Medicine

## 2023-08-30 DIAGNOSIS — I2510 Atherosclerotic heart disease of native coronary artery without angina pectoris: Secondary | ICD-10-CM

## 2023-08-30 DIAGNOSIS — I10 Essential (primary) hypertension: Secondary | ICD-10-CM

## 2023-08-30 NOTE — Telephone Encounter (Signed)
 Prescription Request  08/30/2023  LOV: 08/23/2023  What is the name of the medication or equipment?   carvedilol  (COREG ) 25 MG tablet  **90 day refill requested**  Have you contacted your pharmacy to request a refill? Yes   Which pharmacy would you like this sent to?  [id number] [place], [place] - [address] [place] [address] Phone: [phone] Fax: [phone]    Patient notified that their request is being sent to the clinical staff for review and that they should receive a response within 2 business days.   Please advise pharmacist.

## 2023-09-01 DIAGNOSIS — I129 Hypertensive chronic kidney disease with stage 1 through stage 4 chronic kidney disease, or unspecified chronic kidney disease: Secondary | ICD-10-CM | POA: Diagnosis not present

## 2023-09-01 DIAGNOSIS — I272 Pulmonary hypertension, unspecified: Secondary | ICD-10-CM | POA: Diagnosis not present

## 2023-09-01 DIAGNOSIS — N1832 Chronic kidney disease, stage 3b: Secondary | ICD-10-CM | POA: Diagnosis not present

## 2023-09-01 DIAGNOSIS — E1129 Type 2 diabetes mellitus with other diabetic kidney complication: Secondary | ICD-10-CM | POA: Diagnosis not present

## 2023-09-01 DIAGNOSIS — E872 Acidosis, unspecified: Secondary | ICD-10-CM | POA: Diagnosis not present

## 2023-09-01 DIAGNOSIS — R809 Proteinuria, unspecified: Secondary | ICD-10-CM | POA: Diagnosis not present

## 2023-09-01 DIAGNOSIS — E1122 Type 2 diabetes mellitus with diabetic chronic kidney disease: Secondary | ICD-10-CM | POA: Diagnosis not present

## 2023-09-01 MED ORDER — CARVEDILOL 25 MG PO TABS: 25.0000 mg | ORAL_TABLET | Freq: Two times a day (BID) | ORAL | 1 refills | Status: DC

## 2023-09-01 NOTE — Telephone Encounter (Signed)
 OV 08/23/23 Requested Prescriptions  Pending Prescriptions Disp Refills   carvedilol  (COREG ) 25 MG tablet 180 tablet 0    Sig: Take 1 tablet (25 mg total) by mouth 2 (two) times daily.     Cardiovascular: Beta Blockers 3 Failed - 09/01/2023  2:25 PM      Failed - Cr in normal range and within 360 days    Creat  Date Value Ref Range Status  08/23/2023 1.83 (H) 0.70 - 1.28 mg/dL Final   Creatinine, Urine  Date Value Ref Range Status  08/23/2023 43 20 - 320 mg/dL Final         Passed - AST in normal range and within 360 days    AST  Date Value Ref Range Status  08/23/2023 25 10 - 35 U/L Final         Passed - ALT in normal range and within 360 days    ALT  Date Value Ref Range Status  08/23/2023 19 9 - 46 U/L Final         Passed - Last BP in normal range    BP Readings from Last 1 Encounters:  08/23/23 138/80         Passed - Last Heart Rate in normal range    Pulse Readings from Last 1 Encounters:  08/23/23 62         Passed - Valid encounter within last 6 months    Recent Outpatient Visits           1 week ago CKD stage 3 due to type 2 diabetes mellitus (HCC)   [place] [facility] Pickard, Nedrow DASEN, MD   2 months ago Ankylosing hyperostosis (forestier), thoracic region   [place] [facility] Pickard, [place] DASEN, MD   4 months ago Lipoma of right axilla   [place] [facility] Duanne Massoud DASEN, MD   6 months ago Thoracic radiculopathy   [place] [facility] Duanne Jacquet DASEN, MD   10 months ago Atypical chest pain   [place] [facility] Pickard, Miklos DASEN, MD

## 2023-09-05 ENCOUNTER — Telehealth: Payer: Self-pay | Admitting: Family Medicine

## 2023-09-05 ENCOUNTER — Other Ambulatory Visit: Payer: Self-pay

## 2023-09-05 DIAGNOSIS — M109 Gout, unspecified: Secondary | ICD-10-CM

## 2023-09-05 MED ORDER — ALLOPURINOL 100 MG PO TABS: 100.0000 mg | ORAL_TABLET | Freq: Every day | ORAL | 1 refills | Status: DC

## 2023-09-05 NOTE — Telephone Encounter (Signed)
 Prescription Request  09/05/2023  LOV: 08/23/2023  What is the name of the medication or equipment?   allopurinol  (ZYLOPRIM ) 100 MG tablet  **90 day script requested**  Have you contacted your pharmacy to request a refill? Yes   Which pharmacy would you like this sent to?  [id number] [place] [address], [place] - [address] [place] [address] Phone: [phone] Fax: [phone]    Patient notified that their request is being sent to the clinical staff for review and that they should receive a response within 2 business days.   Please advise pharmacist.

## 2023-09-07 ENCOUNTER — Other Ambulatory Visit: Payer: Self-pay | Admitting: Family Medicine

## 2023-09-08 ENCOUNTER — Other Ambulatory Visit: Payer: Self-pay

## 2023-09-08 ENCOUNTER — Telehealth: Payer: Self-pay

## 2023-09-08 NOTE — Progress Notes (Unsigned)
   09/08/2023  Patient ID: James Jones, male   DOB: 10-Jun-1945, 78 y.o.   MRN: [id number]  Attempted to contact patient for scheduled appointment for medication management. Left HIPAA compliant message for patient to return my call at their convenience.   Lang Sieve, PharmD, BCGP Clinical Pharmacist  [phone]

## 2023-09-09 ENCOUNTER — Telehealth: Payer: Self-pay

## 2023-09-09 NOTE — Telephone Encounter (Signed)
 PAP: Patient assistance application for Chauncey Mann through [employer] has been mailed to pt's home address on file. Provider portion of application will be faxed to provider's office.

## 2023-09-09 NOTE — Progress Notes (Signed)
   09/09/2023  Patient ID: James Jones, male   DOB: 1945-05-16, 78 y.o.   MRN: [id number]  Spoke with patient regarding need for financial assistance related to CKD/DMII. Currently is getting his Farxiga  through patient assistance without any issues. Would also like to attempt for PAP on Kerendia. Understands strict eligibility requirements.   Performed comprehensive medication review. All meds updated.    Outpatient Encounter Medications as of 09/08/2023  Medication Sig   allopurinol  (ZYLOPRIM ) 100 MG tablet Take 1 tablet (100 mg total) by mouth daily.   amLODipine  (NORVASC ) 10 MG tablet TAKE 1 TABLET BY MOUTH EVERY DAY   aspirin  EC 81 MG tablet Take 1 tablet (81 mg total) by mouth daily. Swallow whole. (Patient not taking: Reported on 09/08/2023)   Blood Glucose Monitoring Suppl (FREESTYLE LITE) DEVI Use to check blood sugar once daily   carvedilol  (COREG ) 25 MG tablet Take 1 tablet (25 mg total) by mouth 2 (two) times daily.   Cholecalciferol (VITAMIN D-3) 1000 UNITS CAPS Take by mouth daily. 2 capsules.   dapagliflozin  propanediol (FARXIGA ) 10 MG TABS tablet Take 1 tablet (10 mg total) by mouth daily before breakfast.   doxazosin  (CARDURA ) 4 MG tablet Take 1 tablet (4 mg total) by mouth daily.   ferrous sulfate  325 (65 FE) MG tablet Take 1 tablet (325 mg total) by mouth 2 (two) times daily with a meal.   fish oil-omega-3 fatty acids  1000 MG capsule Take 1,200 mg by mouth daily.    FREESTYLE LITE test strip USE TO CHECK BLOOD SUGAR 1-2X DAILY. E11.59   KERENDIA 10 MG TABS  (Patient not taking: Reported on 09/08/2023)   Multiple Vitamin (MULTIVITAMIN WITH MINERALS) TABS Take 1 tablet by mouth daily.   pantoprazole  (PROTONIX ) 40 MG tablet TAKE 1 TABLET BY MOUTH EVERY DAY   PRESCRIPTION MEDICATION Uses C-PAP at bedtime   rosuvastatin  (CRESTOR ) 40 MG tablet TAKE 1 TABLET BY MOUTH EVERY DAY   sodium bicarbonate 650 MG tablet Take 650 mg by mouth daily.   valsartan  (DIOVAN ) 320 MG tablet Take 1  tablet (320 mg total) by mouth daily.   No facility-administered encounter medications on file as of 09/08/2023.    Future Appointments  Date Time Provider Department Center  12/01/2023  8:00 AM Trixie File, MD [facility] None  05/10/2024  9:00 AM [facility]-ANNUAL WELLNESS VISIT [facility]  08/14/2024  8:00 AM [facility]-BSUMMIT LAB [facility]  08/23/2024 10:30 AM Pickard, [facility] DASEN, MD [facility]   Follow-up: Agreed to f/u on PRN basis. Will provided information to CPhT to have kerendia pap sent to patient.   Lang Sieve, PharmD, BCGP Clinical Pharmacist  [phone]

## 2023-09-11 ENCOUNTER — Other Ambulatory Visit: Payer: Self-pay | Admitting: Family Medicine

## 2023-09-19 NOTE — Telephone Encounter (Signed)
 Reached out to Patient regarding PAP application for Kerendia ([employer]) - no answer left HIPAA compliant v/m to return call for any assistance if needed.

## 2023-09-20 NOTE — Telephone Encounter (Signed)
 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

## 2023-09-21 NOTE — Telephone Encounter (Signed)
 Received patient portion of PAP [employer] application for Kerendia- refaxed provider 09/20/23.

## 2023-09-22 ENCOUNTER — Other Ambulatory Visit: Payer: Self-pay | Admitting: Family Medicine

## 2023-09-22 DIAGNOSIS — M48062 Spinal stenosis, lumbar region with neurogenic claudication: Secondary | ICD-10-CM | POA: Diagnosis not present

## 2023-09-22 DIAGNOSIS — I10 Essential (primary) hypertension: Secondary | ICD-10-CM

## 2023-09-22 NOTE — Telephone Encounter (Signed)
 Prescription Request  09/22/2023  LOV: 08/23/2023  What is the name of the medication or equipment?   valsartan  (DIOVAN ) 320 MG tablet  **90 day script requested**  Have you contacted your pharmacy to request a refill? Yes   Which pharmacy would you like this sent to?  [id number] [place], [place] - [address] [place] [address] Phone: [phone] Fax: [phone]    Patient notified that their request is being sent to the clinical staff for review and that they should receive a response within 2 business days.   Please advise pharmacist.

## 2023-09-23 NOTE — Telephone Encounter (Signed)
 PAP: Application for leonore has been submitted to [employer], via fax

## 2023-09-26 MED ORDER — VALSARTAN 320 MG PO TABS: 320.0000 mg | ORAL_TABLET | Freq: Every day | ORAL | 1 refills | Status: AC

## 2023-09-26 NOTE — Telephone Encounter (Signed)
 Requested Prescriptions  Pending Prescriptions Disp Refills   valsartan  (DIOVAN ) 320 MG tablet 90 tablet 1    Sig: Take 1 tablet (320 mg total) by mouth daily.     Cardiovascular:  Angiotensin Receptor Blockers Failed - 09/26/2023  2:34 PM      Failed - Cr in normal range and within 180 days    Creat  Date Value Ref Range Status  08/23/2023 1.83 (H) 0.70 - 1.28 mg/dL Final   Creatinine, Urine  Date Value Ref Range Status  08/23/2023 43 20 - 320 mg/dL Final         Passed - K in normal range and within 180 days    Potassium  Date Value Ref Range Status  08/23/2023 3.6 3.5 - 5.3 mmol/L Final         Passed - Patient is not pregnant      Passed - Last BP in normal range    BP Readings from Last 1 Encounters:  08/23/23 138/80         Passed - Valid encounter within last 6 months    Recent Outpatient Visits           1 month ago CKD stage 3 due to type 2 diabetes mellitus (HCC)   [place] [facility] Pickard, [place] DASEN, MD   3 months ago Ankylosing hyperostosis (forestier), thoracic region   [place] [facility] Pickard, Ruffner DASEN, MD   5 months ago Lipoma of right axilla   [place] [facility] Duanne Nelms DASEN, MD   7 months ago Thoracic radiculopathy   [place] [facility] Duanne Sesay DASEN, MD   11 months ago Atypical chest pain   [place] [facility] Pickard, Betzer DASEN, MD

## 2023-10-06 NOTE — Telephone Encounter (Signed)
 Patient denied approval from [place] for bella) because of over income limit of 300%. He is at 320% of HH. Letter was sent to provider's office and to the patient.

## 2023-10-11 ENCOUNTER — Other Ambulatory Visit: Payer: Self-pay

## 2023-10-11 ENCOUNTER — Telehealth: Payer: Self-pay | Admitting: Family Medicine

## 2023-10-11 MED ORDER — ROSUVASTATIN CALCIUM 40 MG PO TABS: 40.0000 mg | ORAL_TABLET | Freq: Every day | ORAL | 0 refills | Status: DC

## 2023-10-11 NOTE — Telephone Encounter (Signed)
 Sent in medication

## 2023-10-11 NOTE — Telephone Encounter (Signed)
 Prescription Request  10/11/2023  LOV: 08/23/2023  What is the name of the medication or equipment? rosuvastatin  (CRESTOR ) 40 MG tablet   Have you contacted your pharmacy to request a refill? Yes   Which pharmacy would you like this sent to?  [id number] [address], [place] - [address] [place] [address] Phone: [phone] Fax: [phone]    Patient notified that their request is being sent to the clinical staff for review and that they should receive a response within 2 business days.   Please advise at [facility] [phone]

## 2023-10-13 DIAGNOSIS — M48062 Spinal stenosis, lumbar region with neurogenic claudication: Secondary | ICD-10-CM | POA: Diagnosis not present

## 2023-10-17 ENCOUNTER — Encounter (HOSPITAL_BASED_OUTPATIENT_CLINIC_OR_DEPARTMENT_OTHER): Payer: Self-pay | Admitting: Cardiovascular Disease

## 2023-10-17 DIAGNOSIS — R079 Chest pain, unspecified: Secondary | ICD-10-CM

## 2023-10-17 MED ORDER — PANTOPRAZOLE SODIUM 40 MG PO TBEC: 40.0000 mg | DELAYED_RELEASE_TABLET | Freq: Every day | ORAL | 1 refills | Status: AC

## 2023-10-17 NOTE — Addendum Note (Signed)
 Addended by: GLADIS PORTER HERO on: 10/17/2023 02:37 PM   Modules accepted: Orders

## 2023-11-21 ENCOUNTER — Telehealth: Payer: Self-pay

## 2023-11-21 NOTE — Telephone Encounter (Signed)
 PAP: Patient assistance application for Farxiga through [employer] (AZ&Me) has been mailed to pt's home address on file. Provider portion of application will be faxed to provider's office.

## 2023-11-21 NOTE — Telephone Encounter (Signed)
 Denial letter in [employer] for farxiga  for 2026 renewal enrollment.

## 2023-12-01 ENCOUNTER — Encounter: Payer: Self-pay | Admitting: Rehabilitative and Restorative Service Providers"

## 2023-12-01 ENCOUNTER — Other Ambulatory Visit (HOSPITAL_COMMUNITY): Payer: Self-pay

## 2023-12-01 ENCOUNTER — Ambulatory Visit: Admitting: Rehabilitative and Restorative Service Providers"

## 2023-12-01 ENCOUNTER — Ambulatory Visit: Admitting: Internal Medicine

## 2023-12-01 ENCOUNTER — Encounter: Payer: Self-pay | Admitting: Internal Medicine

## 2023-12-01 VITALS — BP 118/62 | HR 62 | Ht 68.0 in | Wt 195.6 lb

## 2023-12-01 DIAGNOSIS — E119 Type 2 diabetes mellitus without complications: Secondary | ICD-10-CM | POA: Diagnosis not present

## 2023-12-01 DIAGNOSIS — Z7984 Long term (current) use of oral hypoglycemic drugs: Secondary | ICD-10-CM

## 2023-12-01 DIAGNOSIS — M5459 Other low back pain: Secondary | ICD-10-CM

## 2023-12-01 DIAGNOSIS — R262 Difficulty in walking, not elsewhere classified: Secondary | ICD-10-CM | POA: Diagnosis not present

## 2023-12-01 DIAGNOSIS — M6281 Muscle weakness (generalized): Secondary | ICD-10-CM | POA: Diagnosis not present

## 2023-12-01 DIAGNOSIS — E663 Overweight: Secondary | ICD-10-CM

## 2023-12-01 DIAGNOSIS — R293 Abnormal posture: Secondary | ICD-10-CM

## 2023-12-01 DIAGNOSIS — E785 Hyperlipidemia, unspecified: Secondary | ICD-10-CM

## 2023-12-01 LAB — POCT GLYCOSYLATED HEMOGLOBIN (HGB A1C): Hemoglobin A1C: 6.7 % — AB (ref 4.0–5.6)

## 2023-12-01 MED ORDER — EMPAGLIFLOZIN 10 MG PO TABS: 10.0000 mg | ORAL_TABLET | Freq: Every day | ORAL | 3 refills | Status: AC

## 2023-12-01 NOTE — Therapy (Signed)
 OUTPATIENT PHYSICAL THERAPY THORACOLUMBAR EVALUATION   Patient Name: James Jones MRN: [id number] DOB:Jul 24, 1945, 78 y.o., male Today's Date: 12/01/2023  END OF SESSION:  PT End of Session - 12/01/23 1717     Visit Number 1    Number of Visits 16    Date for Recertification  01/26/24    Authorization Type Medicare    Progress Note Due on Visit 10    PT Start Time 0928    PT Stop Time 1012    PT Time Calculation (min) 44 min    Activity Tolerance Patient tolerated treatment well;No increased pain;Patient limited by pain    Behavior During Therapy [facility] for tasks assessed/performed          Past Medical History:  Diagnosis Date   Aortic stenosis 04/02/2021   Chronic diastolic heart failure (HCC)    CKD (chronic kidney disease) stage 2, GFR 60-89 ml/min 03/17/2012   Coronary artery disease, non-occlusive 03/18/2012   60-70% stenosis in RCA -- FFR 0.84; EF 60-65%   Diabetes mellitus without complication (HCC)    GERD (gastroesophageal reflux disease)    On PPI   History of GI bleed 02/2013   No obvious findings on EGD/colonoscopy   Hydrocele, bilateral    large on US  2020   Hyperlipidemia    Hypertension    Obesity (BMI 30.0-34.9) 10/02/2012   OSA (obstructive sleep apnea) 03/18/2012   Doing better with CPAP   Pulmonary hypertension (HCC) 12/10/2010   ECHO:  Mild PH,mild LVH; PA pressures estimated 30-40 mmHg   Pulmonary hypertension, unspecified (HCC) 06/04/2021   RBBB, 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   Event monitor; usually during sleeping hours; therefore not on beta blocker   Past Surgical History:  Procedure Laterality Date   APPENDECTOMY  1974   CARPAL TUNNEL RELEASE     EYE SURGERY  1960   Left eye   HEMORRHOID SURGERY     LEFT HEART CATH AND CORONARY ANGIOGRAPHY  03/27/2010   Moderate mid RCA lesion,right radial approach,normal EF   LEFT HEART CATHETERIZATION WITH CORONARY ANGIOGRAM N/A 03/17/2012   Procedure: LEFT HEART CATHETERIZATION  WITH CORONARY ANGIOGRAM;  Surgeon: Alm LELON Clay, MD;  Location: [facility] CATH LAB;  Service: Cardiovascular: Fractional Flow Reserve Measurement of mid RCA 60-70% stenosis = 0.84. Otherwise mild LCA CAD.  Normal EF & EDP   SHOULDER ARTHROSCOPY  2006   TRANSTHORACIC ECHOCARDIOGRAM  12/2010    Mild concentric LVH.  EF> 55%.  GR 1 DD.  Mild aortic sclerosis no stenosis.  PA pressures estimated 30-40 mmHg   Patient Active Problem List   Diagnosis Date Noted   Lipoma of torso 05/10/2023   Screen for colon cancer 08/31/2022   Ganglion cyst of wrist, right 02/16/2022   Pain in right hand 02/03/2022   Atypical chest pain 10/05/2021   GERD (gastroesophageal reflux disease)    Chronic diastolic heart failure (HCC)    Stage 3b chronic kidney disease (CKD) (HCC)    Pulmonary hypertension, unspecified (HCC) 06/04/2021   Aortic stenosis 04/02/2021   History of anemia 01/27/2021   Class 1 obesity with serious comorbidity and body mass index (BMI) of 30.0 to 30.9 in adult 02/13/2019   Heartburn 03/24/2016   OSA on CPAP 08/02/2015   Controlled diabetes mellitus with circulatory complication, without long-term current use of insulin  (HCC) 04/29/2014   Acute blood loss anemia 02/13/2013   Hypotestosteronism 01/02/2013   Type 2 diabetes mellitus without complication 01/01/2013   Dyslipidemia, goal LDL below 70  10/15/2012   Obesity (BMI 30.0-34.9) 10/02/2012   Iron deficiency anemia 09/29/2012   Kidney disease 09/29/2012   Microscopic hematuria 04/04/2012   AV block, 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 Duanne, MD  REFERRING PROVIDER: Victory Gunnels, MD  REFERRING DIAG: [phone] (ICD-10-CM) - Spinal stenosis, lumbar region with neurogenic claudication  Rationale for Evaluation and Treatment: Rehabilitation  THERAPY DIAG:  Abnormal posture - Plan: PT plan of care cert/re-cert  Difficulty in walking, not elsewhere classified - Plan: PT plan of care cert/re-cert  Muscle weakness (generalized) - Plan: PT plan of care cert/re-cert  Other low back pain - Plan: PT plan of care cert/re-cert  ONSET DATE: 3 to 4 months ago  SUBJECTIVE:                                                                                                                                                                                           SUBJECTIVE STATEMENT: James Jones notes he had a cortisone shot a month ago with no relief.  He notes low back pain of 3-4 months duration.  Despite his back pain, he is working out on an elliptical machine 3 days a week.  He would like to be able to walk better, be stronger, be able to complete house chores like dishes and ADLs like brushing his teeth without being limited by low back pain.  PERTINENT HISTORY:  Significant cardiac history including chronic heart failure, stage II chronic kidney disease, diabetes, previous shoulder arthroscopy, former smoker for 50 years  PAIN:  Are you having pain? Yes: NPRS scale: 3-6/10 Pain location: Low back and mostly anterior thighs Pain description: Sore, like I'm being punched in my back Aggravating factors: Standing and walking, particularly when bent forward Relieving factors: Grabbing a cart when walking helps, change of position  PRECAUTIONS: Back  RED FLAGS: Be aware of significant cardiac history   WEIGHT BEARING RESTRICTIONS: No  FALLS:  Has patient fallen in last 6 months? No  LIVING ENVIRONMENT: Lives with: lives with their family and lives with their  spouse Lives in: House/apartment Stairs: Difficulty ascending Has following equipment at home: None  OCCUPATION: Retired  PLOF: Independent  PATIENT GOALS: Return to normal house chores (  dishes) with out pain  NEXT MD VISIT: 12/25/2023  OBJECTIVE:  Note: Objective measures were completed at Evaluation unless otherwise noted.  DIAGNOSTIC FINDINGS:  1. Bulky spondylitic spurring with multilevel ankylosis. 2. Focal foraminal impingement at T8-9 on the left, and open level by chest CT in 2024, with degeneration. The spinal canal is widely patent.   M48.062 (ICD-10-CM) - Spinal stenosis, lumbar region with neurogenic claudication  PATIENT SURVEYS:  PSFS: THE PATIENT SPECIFIC FUNCTIONAL SCALE  Place score of 0-10 (0 = unable to perform activity and 10 = able to perform activity at the same level as before injury or problem)  Activity Date: 12/01/2023     Standing 2    2.  Walking 2    3.     4.      Total Score 2      Total Score = Sum of activity scores/number of activities  Minimally Detectable Change: 3 points (for single activity); 2 points (for average score)  [facility] (nd). The Patient Specific Functional Scale . Retrieved from [email or web address]   COGNITION: Overall cognitive status: Within functional limits for tasks assessed     SENSATION: Only thigh pain with prolonged standing  MUSCLE LENGTH: Hamstrings: Right 30 deg; Left 30 deg  POSTURE: rounded shoulders, forward head, decreased lumbar lordosis, and flexed trunk   LUMBAR ROM:   AROM 12/01/2023  Flexion   Extension 5  Right lateral flexion   Left lateral flexion 20  Right rotation 20  Left rotation    (Blank rows = not tested)  LOWER EXTREMITY ROM:     Passive  Left/Right 12/01/2023   Hip flexion 85/80   Hip extension    Hip abduction    Hip adduction    Hip internal rotation 8/4   Hip external rotation 18/26   Knee  flexion    Knee extension    Ankle dorsiflexion    Ankle plantarflexion    Ankle inversion    Ankle eversion     (Blank rows = not tested)  STRENGTH:  Deferred at evaluation  MMT Left/Right   Hip flexion    Hip extension    Hip abduction    Hip adduction    Hip internal rotation    Hip external rotation    Knee flexion    Knee extension    Ankle dorsiflexion    Ankle plantarflexion    Ankle inversion    Ankle eversion     (Blank rows = not tested)  GAIT: Distance walked: 50 feet Assistive device utilized: None Level of assistance: Complete Independence Comments: Bodin ambulates in a flexed posture and notes he has increases in back pain almost immediately when standing and walking  TREATMENT DATE:                 12/01/2023 Standing gentle lumbar extension AROM, hands of low to include the entire lumbar spine and instructions to stop with any increase in peripheral symptoms (none at evaluation) 10 x 3 seconds Single knee-to-chest stretch with other leg straight 4 x 20 seconds Supine hamstrings stretch with other leg straight 4 x 20 seconds  02464: Review of examination findings; day 1 home exercise program; review of imaging; review of spine anatomy using the spine model and the importance of avoiding increasing peripheral symptoms  PATIENT EDUCATION:  Education details: See above Person educated: Patient Education method: Explanation, Demonstration, Tactile cues, Verbal cues, and Handouts Education comprehension: verbalized understanding, returned demonstration, verbal cues required, tactile cues required, and needs further education  HOME EXERCISE PROGRAM: Access Code: [email or web address] URL: [email or web address] Date: 12/01/2023 Prepared by: Lamar Ivory  Exercises - Standing Lumbar Extension at Wall - Forearms  - 5 x daily - 7 x weekly - 1 sets - 5 reps - 3 seconds hold - Single Knee to  Chest Stretch  - 2-3 x daily - 7 x weekly - 1 sets - 5 reps - 20 seconds hold - Supine Hamstring Stretch  - 2-3 x daily - 7 x weekly - 1 sets - 5 reps - 20 seconds hold                                             ASSESSMENT:  CLINICAL IMPRESSION: Patient is a 78 y.o. male who was seen today for physical therapy evaluation and treatment for M48.062 (ICD-10-CM) - Spinal stenosis, lumbar region with neurogenic claudication.  James Jones notes low back pain of 3 to 4 months duration.  He also notes he gets mostly anterior thigh symptoms with prolonged standing and walking.  Although he stands in a mildly flexed posture, he had no increase in peripheral symptoms with extension.  Difficulties with standing and walking are certainly affected by his posture and he will benefit from appropriate hip flexibility and spine strengthening activities so that he may function in a more upright posture for longer periods of time without pain.  OBJECTIVE IMPAIRMENTS: Abnormal gait, decreased activity tolerance, decreased endurance, decreased knowledge of condition, difficulty walking, decreased ROM, decreased strength, decreased safety awareness, impaired perceived functional ability, impaired flexibility, improper body mechanics, postural dysfunction, and pain.   ACTIVITY LIMITATIONS: carrying, lifting, bending, standing, stairs, bed mobility, and locomotion level  PARTICIPATION LIMITATIONS: community activity  PERSONAL FACTORS: Significant cardiac history including chronic heart failure, stage II chronic kidney disease, diabetes, previous shoulder arthroscopy, former smoker for 50 years are also affecting patient's functional outcome.   REHAB POTENTIAL: Good  CLINICAL DECISION MAKING: Stable/uncomplicated  EVALUATION COMPLEXITY: Low   GOALS: Goals reviewed with patient? Yes  SHORT TERM GOALS: Target date: 12/29/2023  James Jones will be independent with his day 1 home exercise program Baseline: Started  12/01/2023 Goal status: INITIAL  2.  Improve pain-free lumbar extension AROM without an increase in radicular symptoms to 10 degrees Baseline: 5 degrees Goal status: INITIAL  3.  Improve bilateral lower extremity flexibility for hip flexion to 90 degrees; hamstrings to 40 degrees and hip ER to 25 degrees Baseline: 85/80; 30/30 and 18/26 respectively Goal status: INITIAL   LONG TERM GOALS: Target date: 01/26/2024  Improve patient-specific functional score to at least 5 Baseline: 2 Goal status: INITIAL  2.  James Jones will report low back pain no greater than 3/10 on the numeric pain rating scale Baseline: 3-6/10 with symptoms in the mostly anterior thigh as well Goal status: INITIAL  3.  Improve low back strength as assessed by James Jones ability to stand and walk for 10 or more minutes without an increase in symptoms Baseline: Symptoms almost immediately when standing and walking Goal status: INITIAL  4.  James Jones will be independent with his long-term maintenance home exercise program at discharge Baseline: Started 12/01/2023 Goal status: INITIAL   PLAN:  PT FREQUENCY: 1-2x/week  PT DURATION: 8 weeks  PLANNED INTERVENTIONS: 97110-Therapeutic exercises, 97530- Therapeutic activity, 97535- Self Care, 02859- Manual therapy, [id number]- Gait training, [phone]- Electrical stimulation (unattended), [phone]- Traction (mechanical), [phone] (1-2 muscles), 20561 (3+ muscles)- Dry Needling, Patient/Family education, Spinal mobilization, Cryotherapy, and Moist heat.  PLAN FOR NEXT SESSION: Review day 1 home exercises with emphasis on extension without increasing peripheral symptoms, hip flexibility and lumbar strengthening.  Education, posture and body mechanics work is strongly encouraged.   Myer LELON Ivory, PT, MPT 12/01/2023, 5:35 PM

## 2023-12-01 NOTE — Progress Notes (Signed)
 Patient ID: Loxley Schmale, male   DOB: 06/18/1945, 78 y.o.   MRN: [id number]  HPI: Irby Fails is a 78 y.o.-year-old male, returning for f/u for DM2, dx in ~2005, non-insulin -dependent, lately more controlled, with complications (CAD, CKD). Last visit 6 months ago. PCP: Dr. Nofsinger Burr  Interim history: No blurry vision, nausea, chest pain.  He tells me that he was advised that he did not qualify for Farxiga  patient assistance programs in the new year. He did not run out of yet. He is getting steroid injections in his back, last was 1 month ago.  Reviewed HbA1c levels: Lab Results  Component Value Date   HGBA1C 6.9 (H) 08/17/2023   HGBA1C 6.5 (A) 05/31/2023   HGBA1C 6.5 (A) 12/28/2022  04/11/2014: HbA1c 8.4% 01/08/2014: HbA1c 7.7% He had steroid inj for gout in his elbows - 12/2013. He also had a steroid inj in knee fall 2016, too.   He is on: - Farxiga  10 mg daily - very expensive (600$ for 3 mo >> cheaper: 464$) >> now through PAP We had to stop Januvia  since last visit due to price.  Tradjenta  was also not covered. Previously on low-dose Metformin  ER, but stopped 07/2018 due to CKD Had nausea, vomiting, loss of appetite with regular metformin .  He was on Actos . He was on insulin  before (Lantus) - came off years ago. We stopped glipizide  ER 2.5 mg daily in 09/2021.  Pt checks his sugars 1-2 times a day,: - am: 105-147, 161 (steroid) >> 96-120 >> 108-118 >> 90-119, 131 - 2h after b'fast: n/c >> 118 >> n/c >> 127 >> 153 >> 196, 212 - before lunch: 227  (steroid)  >> 118, 120 >> 85-117, 133 >> 137, 139 - 2h after lunch: 102-149 >> 120 >> 137-157 >> n/c >> 103 - before dinner:  87, 109, 190 >> 94-134 >> 79, 104 >> 102-134, 237 - 2h after dinner:  n/c >> 187 >> n/c >> 190 >> 226 (sweets) >> n/c - bedtime: 88, 172 >> 52, 122 >> 113 >> 113, 154 >> 91 - nighttime: 122 >> n/c >> 174 >> n/c Lowest 52 x1 >> 94 >> 79  >> 90; he has hypoglycemia awareness in the 70s. Highest sugar was  226 (juice) >> 237.  Glucometer: Freestyle [employer] did not cover Freestyle Libre CGM.  Pt's meals are: - Breakfast: bacon + eggs, sausage, grits, oatmeal, cereal, toast wheat - Lunch: BLT, soups, fruit, nuts - Dinner: chicken, pork + greens, rice  - Snacks: 1 a day: pretzels; carrots  Previously exercising by walking several times a week, but stopped exercising due to the coronavirus pandemic. Walking 6 mi MWF at the [facility]  -+ CKD-sees nephrology (Dr. Katheryn Saba) (renal ultrasound showed renal cysts), latest BUN/creatinine:  Lab Results  Component Value Date   BUN 22 08/23/2023   CREATININE 1.83 (H) 08/23/2023  On valsartan .  Leonore was too $$ and he was not approved for PAP.  -+ HL; last set of lipids: Lab Results  Component Value Date   CHOL 136 08/17/2023   HDL 53 08/17/2023   LDLCALC 65 08/17/2023   TRIG 92 08/17/2023   CHOLHDL 2.6 08/17/2023  On rosuvastatin  40, fish oil.  He still has some muscle aches, improved.  He had more muscle aches when he was on pravastatin  40 before.  - last eye exam was 05/2023: No DR reportedly.  He has history of cataract surgery in 2016 and 17.   - no numbness and tingling in  his feet. + Occasional pain in hands and feet. Last foot exam 05/31/2023.  Latest TSH was normal: Lab Results  Component Value Date   TSH 1.740 10/02/2020   ROS: + see HPI  I reviewed pt's medications, allergies, PMH, social hx, family hx, and changes were documented in the history of present illness. Otherwise, unchanged from my initial visit note.  Past Medical History:  Diagnosis Date   Aortic stenosis 04/02/2021   Chronic diastolic heart failure (HCC)    CKD (chronic kidney disease) stage 2, GFR 60-89 ml/min 03/17/2012   Coronary artery disease, non-occlusive 03/18/2012   60-70% stenosis in RCA -- FFR 0.84; EF 60-65%   Diabetes mellitus without complication (HCC)    GERD (gastroesophageal reflux disease)    On PPI   History of GI bleed 02/2013   No  obvious findings on EGD/colonoscopy   Hydrocele, bilateral    large on US  2020   Hyperlipidemia    Hypertension    Obesity (BMI 30.0-34.9) 10/02/2012   OSA (obstructive sleep apnea) 03/18/2012   Doing better with CPAP   Pulmonary hypertension (HCC) 12/10/2010   ECHO:  Mild PH,mild LVH; PA pressures estimated 30-40 mmHg   Pulmonary hypertension, unspecified (HCC) 06/04/2021   RBBB, 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   Event monitor; usually during sleeping hours; therefore not on beta blocker  + Gout  Past Surgical History:  Procedure Laterality Date   APPENDECTOMY  1974   CARPAL TUNNEL RELEASE     EYE SURGERY  1960   Left eye   HEMORRHOID SURGERY     LEFT HEART CATH AND CORONARY ANGIOGRAPHY  03/27/2010   Moderate mid RCA lesion,right radial approach,normal EF   LEFT HEART CATHETERIZATION WITH CORONARY ANGIOGRAM N/A 03/17/2012   Procedure: LEFT HEART CATHETERIZATION WITH CORONARY ANGIOGRAM;  Surgeon: Alm LELON Clay, MD;  Location: [facility] CATH LAB;  Service: Cardiovascular: Fractional Flow Reserve Measurement of mid RCA 60-70% stenosis = 0.84. Otherwise mild LCA CAD.  Normal EF & EDP   SHOULDER ARTHROSCOPY  2006   TRANSTHORACIC ECHOCARDIOGRAM  12/2010    Mild concentric LVH.  EF> 55%.  GR 1 DD.  Mild aortic sclerosis no stenosis.  PA pressures estimated 30-40 mmHg   History   Social History   Marital Status: Married    Spouse Name: N/A   Occupational History   retired   Social History Main Topics   Smoking status: Former Smoker -- 2.00 packs/day    Quit date: 04/12/2012   Smokeless tobacco: Never Used   Alcohol Use: 0.5 oz/week    1 drink(s) per week   Drug Use: Yes    Special: Marijuana     Comment: cannibus   Social History Narrative   Married, father of 2, grandfather of 3.   He is a former smoker of about a pack to pack and half cigarettes a day -- he quit in March of this year.    He is an avid exerciser working at least 4-5 days a week doing her  walking or stationary bike.   Current Outpatient Medications on File Prior to Visit  Medication Sig Dispense Refill   allopurinol  (ZYLOPRIM ) 100 MG tablet Take 1 tablet (100 mg total) by mouth daily. 90 tablet 1   amLODipine  (NORVASC ) 10 MG tablet TAKE 1 TABLET BY MOUTH EVERY DAY 90 tablet 1   aspirin  EC 81 MG tablet Take 1 tablet (81 mg total) by mouth daily. Swallow whole. (Patient not taking: Reported on 09/08/2023) 90 tablet  3   Blood Glucose Monitoring Suppl (FREESTYLE LITE) DEVI Use to check blood sugar once daily 1 each 0   carvedilol  (COREG ) 25 MG tablet Take 1 tablet (25 mg total) by mouth 2 (two) times daily. 180 tablet 1   Cholecalciferol (VITAMIN D-3) 1000 UNITS CAPS Take by mouth daily. 2 capsules.     dapagliflozin  propanediol (FARXIGA ) 10 MG TABS tablet Take 1 tablet (10 mg total) by mouth daily before breakfast. 90 tablet 4   doxazosin  (CARDURA ) 4 MG tablet TAKE 1 TABLET BY MOUTH EVERY DAY 90 tablet 1   ferrous sulfate  325 (65 FE) MG tablet Take 1 tablet (325 mg total) by mouth 2 (two) times daily with a meal. 180 tablet 3   fish oil-omega-3 fatty acids  1000 MG capsule Take 1,200 mg by mouth daily.      FREESTYLE LITE test strip USE TO CHECK BLOOD SUGAR 1-2X DAILY. E11.59 100 strip 1   KERENDIA 10 MG TABS  (Patient not taking: Reported on 09/08/2023)     Multiple Vitamin (MULTIVITAMIN WITH MINERALS) TABS Take 1 tablet by mouth daily.     nitroGLYCERIN  (NITROSTAT ) 0.4 MG SL tablet Place 1 tablet (0.4 mg total) under the tongue every 5 (five) minutes as needed for chest pain. 25 tablet 4   pantoprazole  (PROTONIX ) 40 MG tablet Take 1 tablet (40 mg total) by mouth daily. 90 tablet 1   PRESCRIPTION MEDICATION Uses C-PAP at bedtime     rosuvastatin  (CRESTOR ) 40 MG tablet Take 1 tablet (40 mg total) by mouth daily. 90 tablet 0   sodium bicarbonate 650 MG tablet Take 650 mg by mouth daily.     valsartan  (DIOVAN ) 320 MG tablet Take 1 tablet (320 mg total) by mouth daily. 90 tablet 1   No  current facility-administered medications on file prior to visit.   Allergies  Allergen Reactions   Atorvastatin      myalgias   Family History  Problem Relation Age of Onset   Hypertension Mother    Kidney failure Mother    Coronary artery disease Father    Hypertension Sister    Kidney failure Sister    Stroke Maternal Grandmother    PE: BP 118/62   Pulse 62   Ht 5' 8 (1.727 m)   Wt 195 lb 9.6 oz (88.7 kg)   SpO2 96%   BMI 29.74 kg/m   Wt Readings from Last 10 Encounters:  12/01/23 195 lb 9.6 oz (88.7 kg)  08/23/23 196 lb 12.8 oz (89.3 kg)  06/16/23 198 lb 6.4 oz (90 kg)  05/31/23 200 lb 9.6 oz (91 kg)  05/10/23 204 lb (92.5 kg)  04/21/23 200 lb (90.7 kg)  02/22/23 201 lb (91.2 kg)  01/11/23 196 lb 9.6 oz (89.2 kg)  12/28/22 199 lb (90.3 kg)  11/30/22 201 lb (91.2 kg)   Constitutional: overweight, in NAD Eyes: EOMI, no exophthalmos ENT: no thyromegaly, no cervical lymphadenopathy Cardiovascular: RRR, No RG, + 2/6 SEM Respiratory: CTA B Musculoskeletal: no deformities Skin: no rashes Neurological: no tremor with outstretched hands  ASSESSMENT: 1. DM2, non-insulin -dependent, uncontrolled, with complications - CAD - cardiologist Dr. Anner - CKD - was seeing Dr Terisa >> Dr. Katheryn Saba  2. HL  3.  Overweight  PLAN:  1. Patient with previously uncontrolled type 2 diabetes, with much improved control in the last few years.  He was previously on a DPP 4 inhibitor and low-dose sulfonylurea but currently doing well on an SGLT2 inhibitor.  He is obtaining this  from the patient assistance program.  At last visit, sugars were almost all at goal with only hyperglycemic values in the 200s after drinking cranberry juice.  We discussed about stopping any type of sweet drinks, including juice.  We did not change his regimen otherwise.  HbA1c at last visit was 6.5% but he had another HbA1c obtained 3.5 months ago and this was higher, at 6.9%. - At today's visit, sugars  are mostly at goal, but he does have hyperglycemic exceptions especially after breakfast and occasionally later in the day.  He mentions that he has higher blood sugars after eating [place] desserts.  We discussed about trying to limit these, to cut the portions in half and only eat a minimum meal, rather than by themselves.  We do not need to change the regimen otherwise, especially in the light of the decreased HbA1c, however, his PAP will expire at the end of the year and he cannot renew this as his income is above the threshold.  I did send a prescription for Jardiance to the pharmacy and if this is not covered for him, we gave him PAP paperwork for this medication.  I am hoping that we can continue an SGLT2 inhibitor not only for diabetes control but also for his CKD and CAD. - I suggested to:  Patient Instructions  Please continue: - Farxiga  10 mg or Jardiance 10 mg before b'fast  Please return in 4-6 months with your sugar log.   - we checked his HbA1c: 6.7% (lower) - advised to check sugars at different times of the day - 1x a day, rotating check times - advised for yearly eye exams >> he is UTD - return to clinic in 4-6 months  2. HL - Latest lipid panel was reviewed from 08/2023: LDL slightly higher than our goal of less than 55 due to cardiovascular disease, otherwise fractions at goal: Lab Results  Component Value Date   CHOL 136 08/17/2023   HDL 53 08/17/2023   LDLCALC 65 08/17/2023   TRIG 92 08/17/2023   CHOLHDL 2.6 08/17/2023  -He had myalgias on atorvastatin  and also on rosuvastatin , but he tolerates Crestor  40 mg daily well  3.  Overweight - Will continue Farxiga  which should also help with weight loss - We discussed in the past about cutting down fatty foods - he gained 1 pound before last visit. He lost 4 pounds since then.  Lela Fendt, MD PhD [facility]

## 2023-12-01 NOTE — Addendum Note (Signed)
 Addended by: CLEOTILDE ROLIN RAMAN on: 12/01/2023 08:25 AM   Modules accepted: Orders

## 2023-12-01 NOTE — Patient Instructions (Addendum)
 Please continue: - Farxiga  10 mg or Jardiance 10 mg before b'fast  Please return in 4-6 months with your sugar log.

## 2023-12-13 ENCOUNTER — Ambulatory Visit (INDEPENDENT_AMBULATORY_CARE_PROVIDER_SITE_OTHER): Admitting: Physical Therapy

## 2023-12-13 ENCOUNTER — Encounter: Payer: Self-pay | Admitting: Physical Therapy

## 2023-12-13 DIAGNOSIS — R262 Difficulty in walking, not elsewhere classified: Secondary | ICD-10-CM

## 2023-12-13 DIAGNOSIS — M5459 Other low back pain: Secondary | ICD-10-CM

## 2023-12-13 DIAGNOSIS — M6281 Muscle weakness (generalized): Secondary | ICD-10-CM

## 2023-12-13 DIAGNOSIS — R293 Abnormal posture: Secondary | ICD-10-CM

## 2023-12-13 NOTE — Therapy (Signed)
 OUTPATIENT PHYSICAL THERAPY THORACOLUMBAR EVALUATION   Patient Name: Elai Vanwyk MRN: [id number] DOB:1945-07-31, 78 y.o., male Today's Date: 12/13/2023  END OF SESSION:  PT End of Session - 12/13/23 0914     Visit Number 2    Number of Visits 16    Date for Recertification  01/26/24    Authorization Type Medicare    Progress Note Due on Visit 10    PT Start Time 0845    PT Stop Time 0925    PT Time Calculation (min) 40 min    Activity Tolerance Patient tolerated treatment well;No increased pain;Patient limited by pain    Behavior During Therapy [facility] for tasks assessed/performed           Past Medical History:  Diagnosis Date   Aortic stenosis 04/02/2021   Chronic diastolic heart failure (HCC)    CKD (chronic kidney disease) stage 2, GFR 60-89 ml/min 03/17/2012   Coronary artery disease, non-occlusive 03/18/2012   60-70% stenosis in RCA -- FFR 0.84; EF 60-65%   Diabetes mellitus without complication (HCC)    GERD (gastroesophageal reflux disease)    On PPI   History of GI bleed 02/2013   No obvious findings on EGD/colonoscopy   Hydrocele, bilateral    large on US  2020   Hyperlipidemia    Hypertension    Obesity (BMI 30.0-34.9) 10/02/2012   OSA (obstructive sleep apnea) 03/18/2012   Doing better with CPAP   Pulmonary hypertension (HCC) 12/10/2010   ECHO:  Mild PH,mild LVH; PA pressures estimated 30-40 mmHg   Pulmonary hypertension, unspecified (HCC) 06/04/2021   RBBB, 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   Event monitor; usually during sleeping hours; therefore not on beta blocker   Past Surgical History:  Procedure Laterality Date   APPENDECTOMY  1974   CARPAL TUNNEL RELEASE     EYE SURGERY  1960   Left eye   HEMORRHOID SURGERY     LEFT HEART CATH AND CORONARY ANGIOGRAPHY  03/27/2010   Moderate mid RCA lesion,right radial approach,normal EF   LEFT HEART CATHETERIZATION WITH CORONARY ANGIOGRAM N/A 03/17/2012   Procedure: LEFT HEART  CATHETERIZATION WITH CORONARY ANGIOGRAM;  Surgeon: Alm LELON Clay, MD;  Location: [facility] CATH LAB;  Service: Cardiovascular: Fractional Flow Reserve Measurement of mid RCA 60-70% stenosis = 0.84. Otherwise mild LCA CAD.  Normal EF & EDP   SHOULDER ARTHROSCOPY  2006   TRANSTHORACIC ECHOCARDIOGRAM  12/2010    Mild concentric LVH.  EF> 55%.  GR 1 DD.  Mild aortic sclerosis no stenosis.  PA pressures estimated 30-40 mmHg   Patient Active Problem List   Diagnosis Date Noted   Lipoma of torso 05/10/2023   Screen for colon cancer 08/31/2022   Ganglion cyst of wrist, right 02/16/2022   Pain in right hand 02/03/2022   Atypical chest pain 10/05/2021   GERD (gastroesophageal reflux disease)    Chronic diastolic heart failure (HCC)    Stage 3b chronic kidney disease (CKD) (HCC)    Pulmonary hypertension, unspecified (HCC) 06/04/2021   Aortic stenosis 04/02/2021   History of anemia 01/27/2021   Class 1 obesity with serious comorbidity and body mass index (BMI) of 30.0 to 30.9 in adult 02/13/2019   Heartburn 03/24/2016   OSA on CPAP 08/02/2015   Controlled diabetes mellitus with circulatory complication, without long-term current use of insulin  (HCC) 04/29/2014   Acute blood loss anemia 02/13/2013   Hypotestosteronism 01/02/2013   Type 2 diabetes mellitus without complication 01/01/2013   Dyslipidemia, goal LDL below  70 10/15/2012   Obesity (BMI 30.0-34.9) 10/02/2012   Iron deficiency anemia 09/29/2012   Kidney disease 09/29/2012   Microscopic hematuria 04/04/2012   AV block, 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 Duanne, MD  REFERRING PROVIDER: Victory Gunnels, MD  REFERRING DIAG: [phone] (ICD-10-CM) - Spinal stenosis, lumbar region with neurogenic claudication  Rationale for Evaluation and Treatment: Rehabilitation  THERAPY DIAG:  Abnormal posture  Difficulty in walking, not elsewhere classified  Muscle weakness (generalized)  Other low back pain  ONSET DATE: 3 to 4 months ago  SUBJECTIVE:                                                                                                                                                                                           SUBJECTIVE STATEMENT: Pt arriving today reporting 3/10 pain, but pt reporting there was one day last week when he felt he was going to pass out due to pain. Pt reporting bending over to pick up his puppy and doing dishes makes his pain worse.   PERTINENT HISTORY:  Significant cardiac history including chronic heart failure, stage II chronic kidney disease, diabetes, previous shoulder arthroscopy, former smoker for 50 years  PAIN:  Are you having pain? Yes: NPRS scale: 3/10 today, at times pain reaches 10/10 Pain location: Low back and mostly anterior thighs Pain description: Sore, like I'm being punched in my back Aggravating factors: Standing and walking, particularly when bent forward Relieving factors: Grabbing a cart when walking helps, change of position  PRECAUTIONS: Back  RED FLAGS: Be aware of significant cardiac history   WEIGHT BEARING RESTRICTIONS: No  FALLS:  Has patient fallen in last 6 months? No  LIVING ENVIRONMENT: Lives with: lives with their family and lives with their spouse Lives in: House/apartment Stairs: Difficulty ascending Has following equipment at home: None  OCCUPATION: Retired  PLOF: Independent  PATIENT GOALS: Return to normal house chores (dishes) with out pain  NEXT MD VISIT:  12/25/2023  OBJECTIVE:  Note: Objective measures were completed at Evaluation unless otherwise noted.  DIAGNOSTIC FINDINGS:  1. Bulky spondylitic spurring with multilevel ankylosis. 2. Focal foraminal impingement at T8-9 on the left, and open level by chest CT in 2024,  with degeneration. The spinal canal is widely patent.   M48.062 (ICD-10-CM) - Spinal stenosis, lumbar region with neurogenic claudication  PATIENT SURVEYS:  PSFS: THE PATIENT SPECIFIC FUNCTIONAL SCALE  Place score of 0-10 (0 = unable to perform activity and 10 = able to perform activity at the same level as before injury or problem)  Activity Date: 12/01/2023     Standing 2    2.  Walking 2    3.     4.      Total Score 2      Total Score = Sum of activity scores/number of activities  Minimally Detectable Change: 3 points (for single activity); 2 points (for average score)  [facility] (nd). The Patient Specific Functional Scale . Retrieved from [email or web address]   COGNITION: Overall cognitive status: Within functional limits for tasks assessed     SENSATION: Only thigh pain with prolonged standing  MUSCLE LENGTH: Hamstrings: Right 30 deg; Left 30 deg  POSTURE: rounded shoulders, forward head, decreased lumbar lordosis, and flexed trunk   LUMBAR ROM:   AROM 12/01/2023  Flexion   Extension 5  Right lateral flexion   Left lateral flexion 20  Right rotation 20  Left rotation    (Blank rows = not tested)  LOWER EXTREMITY ROM:     Passive  Left/Right 12/01/2023   Hip flexion 85/80   Hip extension    Hip abduction    Hip adduction    Hip internal rotation 8/4   Hip external rotation 18/26   Knee flexion    Knee extension    Ankle dorsiflexion    Ankle plantarflexion    Ankle inversion    Ankle eversion     (Blank rows = not tested)  STRENGTH:  Deferred at evaluation  MMT Right / Left 12/13/23   Hip flexion 40.1 / 39.5    Hip extension    Hip abduction    Hip adduction    Hip internal rotation    Hip external rotation    Knee flexion    Knee extension 59.8 / 62.2   Ankle dorsiflexion    Ankle plantarflexion    Ankle inversion    Ankle eversion     (Blank rows = not tested)  GAIT: Distance walked: 50 feet Assistive device utilized: None Level of assistance: Complete Independence Comments: Needham ambulates in a flexed posture and notes he has increases in back pain almost immediately when standing and walking  TREATMENT DATE:   12/13/23: TherEx Seated hip IR: x 10, with red TB around ankles x 10 Seated clam shells 2 x 10 c green TB   Seated SLR 2 x 10 bil LE Seated hamstring stretch: 3 x each LE holding 30 sec Standing hip flexor stretch x 3 holding 30 sec Later shift in door way x 3  TherAct Nustep: level 5 seat 10, arms 11, x 7 minutes Double Leg Press: 75# 3 x 10  Single Leg Press: 43#  x 15  Standing lumbar extension at wall x 10 c 5 sec hold                12/01/2023 Standing gentle lumbar extension AROM, hands of low to include the entire lumbar spine and instructions to stop with any increase in peripheral symptoms (none at evaluation) 10 x 3 seconds Single knee-to-chest stretch with other leg straight 4 x 20 seconds Supine hamstrings stretch with other leg straight 4 x 20 seconds  02464: Review of examination findings;  day 1 home exercise program; review of imaging; review of spine anatomy using the spine model and the importance of avoiding increasing peripheral symptoms                                                                     PATIENT EDUCATION:  Education details: See above Person educated: Patient Education method: Explanation, Demonstration, Tactile cues, Verbal cues, and Handouts Education comprehension: verbalized understanding, returned demonstration, verbal cues required, tactile cues required, and needs further education  HOME EXERCISE PROGRAM:  Access  Code: [email or web address] URL: [email or web address] Date: 12/13/2023 Prepared by: Delon Lunger  Exercises - Standing Lumbar Extension at Wall - Forearms  - 5 x daily - 7 x weekly - 1 sets - 5 reps - 3 seconds hold - Single Knee to Chest Stretch  - 2-3 x daily - 7 x weekly - 1 sets - 5 reps - 20 seconds hold - Seated Hamstring Stretch  - 2-3 x daily - 7 x weekly - 3-5 reps - 30 seconds hold - Lateral Shift Correction at Wall  - 2 x daily - 7 x weekly - 3 reps - 20 seconds hold - Standing Hip Flexor Stretch  - 1 x daily - 7 x weekly - 3 sets - 3 reps - 20 seconds hold                                         ASSESSMENT:  CLINICAL IMPRESSION: Pt's HEP was reviewed. We changed pt's supine hamstring stretch to sitting hamstring stretch  due to inability to get knee stretch to maintain stretch in supine. Pt tolerating exercises well today with focus on core strengthening and stretching. MMT performed for bil LE's in siting using HHD. Recommending continued skilled PT interventions.   OBJECTIVE IMPAIRMENTS: Abnormal gait, decreased activity tolerance, decreased endurance, decreased knowledge of condition, difficulty walking, decreased ROM, decreased strength, decreased safety awareness, impaired perceived functional ability, impaired flexibility, improper body mechanics, postural dysfunction, and pain.   ACTIVITY LIMITATIONS: carrying, lifting, bending, standing, stairs, bed mobility, and locomotion level  PARTICIPATION LIMITATIONS: community activity  PERSONAL FACTORS: Significant cardiac history including chronic heart failure, stage II chronic kidney disease, diabetes, previous shoulder arthroscopy, former smoker for 50 years are also affecting patient's functional outcome.   REHAB POTENTIAL: Good  CLINICAL DECISION MAKING: Stable/uncomplicated  EVALUATION COMPLEXITY: Low   GOALS: Goals reviewed with patient? Yes  SHORT TERM GOALS: Target date: 12/29/2023  [place] will be  independent with his day 1 home exercise program Baseline: Started 12/01/2023 Goal status: ongoing 12/13/23  2.  Improve pain-free lumbar extension AROM without an increase in radicular symptoms to 10 degrees Baseline: 5 degrees Goal status: INITIAL  3.  Improve bilateral lower extremity flexibility for hip flexion to 90 degrees; hamstrings to 40 degrees and hip ER to 25 degrees Baseline: 85/80; 30/30 and 18/26 respectively Goal status: INITIAL   LONG TERM GOALS: Target date: 01/26/2024  Improve patient-specific functional score to at least 5 Baseline: 2 Goal status: INITIAL  2.  [place]ales will report low back pain no greater than 3/10 on the numeric pain rating scale Baseline: 3-6/10 with symptoms in the  mostly anterior thigh as well Goal status: INITIAL  3.  Improve low back strength as assessed by [place] ability to stand and walk for 10 or more minutes without an increase in symptoms Baseline: Symptoms almost immediately when standing and walking Goal status: INITIAL  4.  [place] will be independent with his long-term maintenance home exercise program at discharge Baseline: Started 12/01/2023 Goal status: INITIAL   PLAN:  PT FREQUENCY: 1-2x/week  PT DURATION: 8 weeks  PLANNED INTERVENTIONS: 97110-Therapeutic exercises, 97530- Therapeutic activity, 97535- Self Care, [id number]- Manual therapy, [id number]- Gait training, [phone]- Electrical stimulation (unattended), [id number]- Traction (mechanical), 20560 (1-2 muscles), 20561 (3+ muscles)- Dry Needling, Patient/Family education, Spinal mobilization, Cryotherapy, and Moist heat.  PLAN FOR NEXT SESSION: core strengthening, lumbar and LE stretching as tolerated, mobs as needed   Delon JONELLE Lunger, PT, MPT 12/13/2023, 9:24 AM

## 2023-12-14 NOTE — Therapy (Signed)
 OUTPATIENT PHYSICAL THERAPY THORACOLUMBAR TREATMENT   Patient Name: James Jones MRN: [id number] DOB:Sep 26, 1945, 78 y.o., male Today's Date: 12/15/2023  END OF SESSION:  PT End of Session - 12/15/23 0745     Visit Number 3    Number of Visits 16    Date for Recertification  01/26/24    Authorization Type Medicare    Progress Note Due on Visit 10    PT Start Time 0801    PT Stop Time 0841    PT Time Calculation (min) 40 min    Activity Tolerance Patient tolerated treatment well;No increased pain;Patient limited by pain    Behavior During Therapy [facility] for tasks assessed/performed            Past Medical History:  Diagnosis Date   Aortic stenosis 04/02/2021   Chronic diastolic heart failure (HCC)    CKD (chronic kidney disease) stage 2, GFR 60-89 ml/min 03/17/2012   Coronary artery disease, non-occlusive 03/18/2012   60-70% stenosis in RCA -- FFR 0.84; EF 60-65%   Diabetes mellitus without complication (HCC)    GERD (gastroesophageal reflux disease)    On PPI   History of GI bleed 02/2013   No obvious findings on EGD/colonoscopy   Hydrocele, bilateral    large on US  2020   Hyperlipidemia    Hypertension    Obesity (BMI 30.0-34.9) 10/02/2012   OSA (obstructive sleep apnea) 03/18/2012   Doing better with CPAP   Pulmonary hypertension (HCC) 12/10/2010   ECHO:  Mild PH,mild LVH; PA pressures estimated 30-40 mmHg   Pulmonary hypertension, unspecified (HCC) 06/04/2021   RBBB, 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   Event monitor; usually during sleeping hours; therefore not on beta blocker   Past Surgical History:  Procedure Laterality Date   APPENDECTOMY  1974   CARPAL TUNNEL RELEASE     EYE SURGERY  1960   Left eye   HEMORRHOID SURGERY     LEFT HEART CATH AND CORONARY ANGIOGRAPHY  03/27/2010   Moderate mid RCA lesion,right radial approach,normal EF   LEFT HEART CATHETERIZATION WITH CORONARY ANGIOGRAM N/A 03/17/2012   Procedure: LEFT HEART  CATHETERIZATION WITH CORONARY ANGIOGRAM;  Surgeon: Alm LELON Clay, MD;  Location: [facility] CATH LAB;  Service: Cardiovascular: Fractional Flow Reserve Measurement of mid RCA 60-70% stenosis = 0.84. Otherwise mild LCA CAD.  Normal EF & EDP   SHOULDER ARTHROSCOPY  2006   TRANSTHORACIC ECHOCARDIOGRAM  12/2010    Mild concentric LVH.  EF> 55%.  GR 1 DD.  Mild aortic sclerosis no stenosis.  PA pressures estimated 30-40 mmHg   Patient Active Problem List   Diagnosis Date Noted   Lipoma of torso 05/10/2023   Screen for colon cancer 08/31/2022   Ganglion cyst of wrist, right 02/16/2022   Pain in right hand 02/03/2022   Atypical chest pain 10/05/2021   GERD (gastroesophageal reflux disease)    Chronic diastolic heart failure (HCC)    Stage 3b chronic kidney disease (CKD) (HCC)    Pulmonary hypertension, unspecified (HCC) 06/04/2021   Aortic stenosis 04/02/2021   History of anemia 01/27/2021   Class 1 obesity with serious comorbidity and body mass index (BMI) of 30.0 to 30.9 in adult 02/13/2019   Heartburn 03/24/2016   OSA on CPAP 08/02/2015   Controlled diabetes mellitus with circulatory complication, without long-term current use of insulin  (HCC) 04/29/2014   Acute blood loss anemia 02/13/2013   Hypotestosteronism 01/02/2013   Type 2 diabetes mellitus without complication 01/01/2013   Dyslipidemia, goal LDL  below 70 10/15/2012   Obesity (BMI 30.0-34.9) 10/02/2012   Iron deficiency anemia 09/29/2012   Kidney disease 09/29/2012   Microscopic hematuria 04/04/2012   AV block, 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 Duanne, MD  REFERRING PROVIDER: Victory Gunnels, MD  REFERRING DIAG: [phone] (ICD-10-CM) - Spinal stenosis, lumbar region with neurogenic claudication  Rationale for Evaluation and Treatment: Rehabilitation  THERAPY DIAG:  Abnormal posture  Difficulty in walking, not elsewhere classified  Muscle weakness (generalized)  Other low back pain  ONSET DATE: 3 to 4 months ago  SUBJECTIVE:                                                                                                                                                                                           SUBJECTIVE STATEMENT: Patient noting a little bit of soreness this morning, but having a bad pain day yesterday. Pain/soreness rated at 13/10.   PERTINENT HISTORY:  Significant cardiac history including chronic heart failure, stage II chronic kidney disease, diabetes, previous shoulder arthroscopy, former smoker for 50 years  PAIN:  Are you having pain? Yes: NPRS scale: 3/10 today, at times pain reaches 10/10 Pain location: Low back and mostly anterior thighs Pain description: Sore, like I'm being punched in my back Aggravating factors: Standing and walking, particularly when bent forward Relieving factors: Grabbing a cart when walking helps, change of position  PRECAUTIONS: Back  RED FLAGS: Be aware of significant cardiac history   WEIGHT BEARING RESTRICTIONS: No  FALLS:  Has patient fallen in last 6 months? No  LIVING ENVIRONMENT: Lives with: lives with their family and lives with their spouse Lives in: House/apartment Stairs: Difficulty ascending Has following equipment at home: None  OCCUPATION: Retired  PLOF: Independent  PATIENT GOALS: Return to normal house chores (dishes) with out pain  NEXT MD VISIT: 12/25/2023  OBJECTIVE:  Note: Objective measures were completed at Evaluation unless otherwise noted.  DIAGNOSTIC  FINDINGS:  1. Bulky spondylitic spurring with multilevel ankylosis. 2. Focal foraminal impingement at T8-9 on the left, and open level by chest CT in 2024, with degeneration. The spinal canal is widely patent.   F51.937 (ICD-10-CM) - Spinal stenosis, lumbar region with neurogenic claudication  PATIENT  SURVEYS:  PSFS: THE PATIENT SPECIFIC FUNCTIONAL SCALE  Place score of 0-10 (0 = unable to perform activity and 10 = able to perform activity at the same level as before injury or problem)  Activity Date: 12/01/2023     Standing 2    2.  Walking 2    3.     4.      Total Score 2      Total Score = Sum of activity scores/number of activities  Minimally Detectable Change: 3 points (for single activity); 2 points (for average score)  [facility] (nd). The Patient Specific Functional Scale . Retrieved from [email or web address]   COGNITION: Overall cognitive status: Within functional limits for tasks assessed     SENSATION: Only thigh pain with prolonged standing  MUSCLE LENGTH: Hamstrings: Right 30 deg; Left 30 deg  POSTURE: rounded shoulders, forward head, decreased lumbar lordosis, and flexed trunk   LUMBAR ROM:   AROM 12/01/2023  Flexion   Extension 5  Right lateral flexion   Left lateral flexion 20  Right rotation 20  Left rotation    (Blank rows = not tested)  LOWER EXTREMITY ROM:     Passive  Left/Right 12/01/2023   Hip flexion 85/80   Hip extension    Hip abduction    Hip adduction    Hip internal rotation 8/4   Hip external rotation 18/26   Knee flexion    Knee extension    Ankle dorsiflexion    Ankle plantarflexion    Ankle inversion    Ankle eversion     (Blank rows = not tested)  STRENGTH:  Deferred at evaluation  MMT Right / Left 12/13/23   Hip flexion 40.1 / 39.5   Hip extension    Hip abduction    Hip adduction    Hip internal rotation    Hip external rotation    Knee  flexion    Knee extension 59.8 / 62.2   Ankle dorsiflexion    Ankle plantarflexion    Ankle inversion    Ankle eversion     (Blank rows = not tested)  GAIT: Distance walked: 50 feet Assistive device utilized: None Level of assistance: Complete Independence Comments: James Jones ambulates in a flexed posture and notes he has increases in back pain almost immediately when standing and walking  TREATMENT DATE:   12/15/2023 TherEx:  Nustep level 5 for 8 minutes  Lateral shift in doorway 2x30s each direction Seated hamstring stretch 3x30s each leg  Prone mini cobra 1x5 with 5-8s holds  Supine bridge with red TB 1x10 with 3s hold   TherAct:  Bilat leg press 3x10 with 75# Unilat leg press 2x10 with 43# each leg    12/13/23: TherEx Seated hip IR: x 10, with red TB around ankles x 10 Seated clam shells 2 x 10 c green TB   Seated SLR 2 x 10 bil LE Seated hamstring stretch: 3 x each LE holding 30 sec Standing hip flexor stretch x 3 holding 30 sec Later shift in door way x 3  TherAct Nustep: level 5 seat 10, arms 11, x 7 minutes Double Leg Press: 75# 3 x 10  Single Leg Press: 43#  x 15  Standing lumbar extension at wall x 10 c 5 sec hold                12/01/2023 Standing gentle lumbar extension AROM, hands of low to include the entire lumbar spine and instructions to stop with  any increase in peripheral symptoms (none at evaluation) 10 x 3 seconds Single knee-to-chest stretch with other leg straight 4 x 20 seconds Supine hamstrings stretch with other leg straight 4 x 20 seconds  02464: Review of examination findings; day 1 home exercise program; review of imaging; review of spine anatomy using the spine model and the importance of avoiding increasing peripheral symptoms                                                                     PATIENT EDUCATION:  Education details: See above Person educated: Patient Education method: Explanation, Demonstration, Tactile cues, Verbal cues,  and Handouts Education comprehension: verbalized understanding, returned demonstration, verbal cues required, tactile cues required, and needs further education  HOME EXERCISE PROGRAM:  Access Code: [email or web address] URL: [email or web address] Date: 12/13/2023 Prepared by: Delon Lunger  Exercises - Standing Lumbar Extension at Wall - Forearms  - 5 x daily - 7 x weekly - 1 sets - 5 reps - 3 seconds hold - Single Knee to Chest Stretch  - 2-3 x daily - 7 x weekly - 1 sets - 5 reps - 20 seconds hold - Seated Hamstring Stretch  - 2-3 x daily - 7 x weekly - 3-5 reps - 30 seconds hold - Lateral Shift Correction at Wall  - 2 x daily - 7 x weekly - 3 reps - 20 seconds hold - Standing Hip Flexor Stretch  - 1 x daily - 7 x weekly - 3 sets - 3 reps - 20 seconds hold                                         ASSESSMENT:  CLINICAL IMPRESSION: Patient arrived to session noting improvement in symptoms since yesterday, though has been having increasingly more pain more often. Patient tolerated all activities this date with typical fatigue levels. Patient has a follow up with MD today at 1100. Patient will continue to benefit from skilled PT.    OBJECTIVE IMPAIRMENTS: Abnormal gait, decreased activity tolerance, decreased endurance, decreased knowledge of condition, difficulty walking, decreased ROM, decreased strength, decreased safety awareness, impaired perceived functional ability, impaired flexibility, improper body mechanics, postural dysfunction, and pain.   ACTIVITY LIMITATIONS: carrying, lifting, bending, standing, stairs, bed mobility, and locomotion level  PARTICIPATION LIMITATIONS: community activity  PERSONAL FACTORS: Significant cardiac history including chronic heart failure, stage II chronic kidney disease, diabetes, previous shoulder arthroscopy, former smoker for 50 years are also affecting patient's functional outcome.   REHAB POTENTIAL: Good  CLINICAL DECISION MAKING:  Stable/uncomplicated  EVALUATION COMPLEXITY: Low   GOALS: Goals reviewed with patient? Yes  SHORT TERM GOALS: Target date: 12/29/2023  James Jones will be independent with his day 1 home exercise program Baseline: Started 12/01/2023 Goal status: ongoing 12/13/23  2.  Improve pain-free lumbar extension AROM without an increase in radicular symptoms to 10 degrees Baseline: 5 degrees Goal status: INITIAL  3.  Improve bilateral lower extremity flexibility for hip flexion to 90 degrees; hamstrings to 40 degrees and hip ER to 25 degrees Baseline: 85/80; 30/30 and 18/26 respectively Goal status: INITIAL   LONG TERM GOALS: Target date: 01/26/2024  Improve patient-specific functional  score to at least 5 Baseline: 2 Goal status: INITIAL  2.  James Jones will report low back pain no greater than 3/10 on the numeric pain rating scale Baseline: 3-6/10 with symptoms in the mostly anterior thigh as well Goal status: INITIAL  3.  Improve low back strength as assessed by James Jones ability to stand and walk for 10 or more minutes without an increase in symptoms Baseline: Symptoms almost immediately when standing and walking Goal status: INITIAL  4.  James Jones will be independent with his long-term maintenance home exercise program at discharge Baseline: Started 12/01/2023 Goal status: INITIAL   PLAN:  PT FREQUENCY: 1-2x/week  PT DURATION: 8 weeks  PLANNED INTERVENTIONS: 97110-Therapeutic exercises, 97530- Therapeutic activity, 97535- Self Care, 02859- Manual therapy, [id number]- Gait training, [id number]- Electrical stimulation (unattended), [id number]- Traction (mechanical), 20560 (1-2 muscles), 20561 (3+ muscles)- Dry Needling, Patient/Family education, Spinal mobilization, Cryotherapy, and Moist heat.  PLAN FOR NEXT SESSION:  core strengthening, lumbar and LE stretching as tolerated, mobs as needed   Susannah Daring, PT, DPT 12/15/23 8:44 AM

## 2023-12-15 ENCOUNTER — Ambulatory Visit (INDEPENDENT_AMBULATORY_CARE_PROVIDER_SITE_OTHER)

## 2023-12-15 DIAGNOSIS — M5459 Other low back pain: Secondary | ICD-10-CM

## 2023-12-15 DIAGNOSIS — Z6829 Body mass index (BMI) 29.0-29.9, adult: Secondary | ICD-10-CM | POA: Diagnosis not present

## 2023-12-15 DIAGNOSIS — R293 Abnormal posture: Secondary | ICD-10-CM

## 2023-12-15 DIAGNOSIS — M6281 Muscle weakness (generalized): Secondary | ICD-10-CM | POA: Diagnosis not present

## 2023-12-15 DIAGNOSIS — M48062 Spinal stenosis, lumbar region with neurogenic claudication: Secondary | ICD-10-CM | POA: Diagnosis not present

## 2023-12-15 DIAGNOSIS — R262 Difficulty in walking, not elsewhere classified: Secondary | ICD-10-CM | POA: Diagnosis not present

## 2023-12-19 NOTE — Therapy (Signed)
 OUTPATIENT PHYSICAL THERAPY THORACOLUMBAR TREATMENT   Patient Name: James Jones MRN: [id number] DOB:1946-01-16, 78 y.o., male Today's Date: 12/20/2023  END OF SESSION:  PT End of Session - 12/20/23 0745     Visit Number 4    Number of Visits 16    Date for Recertification  01/26/24    Authorization Type Medicare    Progress Note Due on Visit 10    PT Start Time 0756    PT Stop Time 0836    PT Time Calculation (min) 40 min    Activity Tolerance Patient tolerated treatment well;No increased pain;Patient limited by pain    Behavior During Therapy [facility] for tasks assessed/performed             Past Medical History:  Diagnosis Date   Aortic stenosis 04/02/2021   Chronic diastolic heart failure (HCC)    CKD (chronic kidney disease) stage 2, GFR 60-89 ml/min 03/17/2012   Coronary artery disease, non-occlusive 03/18/2012   60-70% stenosis in RCA -- FFR 0.84; EF 60-65%   Diabetes mellitus without complication (HCC)    GERD (gastroesophageal reflux disease)    On PPI   History of GI bleed 02/2013   No obvious findings on EGD/colonoscopy   Hydrocele, bilateral    large on US  2020   Hyperlipidemia    Hypertension    Obesity (BMI 30.0-34.9) 10/02/2012   OSA (obstructive sleep apnea) 03/18/2012   Doing better with CPAP   Pulmonary hypertension (HCC) 12/10/2010   ECHO:  Mild PH,mild LVH; PA pressures estimated 30-40 mmHg   Pulmonary hypertension, unspecified (HCC) 06/04/2021   RBBB, 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   Event monitor; usually during sleeping hours; therefore not on beta blocker   Past Surgical History:  Procedure Laterality Date   APPENDECTOMY  1974   CARPAL TUNNEL RELEASE     EYE SURGERY  1960   Left eye   HEMORRHOID SURGERY     LEFT HEART CATH AND CORONARY ANGIOGRAPHY  03/27/2010   Moderate mid RCA lesion,right radial approach,normal EF   LEFT HEART CATHETERIZATION WITH CORONARY ANGIOGRAM N/A 03/17/2012   Procedure: LEFT HEART  CATHETERIZATION WITH CORONARY ANGIOGRAM;  Surgeon: Alm LELON Clay, MD;  Location: [facility] C[facility] LAB;  Service: Cardiovascular: Fractional Flow Reserve Measurement of mid RCA 60-70% stenosis = 0.84. Otherwise mild LCA CAD.  Normal EF & EDP   SHOULDER ARTHROSCOPY  2006   TRANSTHORACIC ECHOCARDIOGRAM  12/2010    Mild concentric LVH.  EF> 55%.  GR 1 DD.  Mild aortic sclerosis no stenosis.  PA pressures estimated 30-40 mmHg   Patient Active Problem List   Diagnosis Date Noted   Lipoma of torso 05/10/2023   Screen for colon cancer 08/31/2022   Ganglion cyst of wrist, right 02/16/2022   Pain in right hand 02/03/2022   Atypical chest pain 10/05/2021   GERD (gastroesophageal reflux disease)    Chronic diastolic heart failure (HCC)    Stage 3b chronic kidney disease (CKD) (HCC)    Pulmonary hypertension, unspecified (HCC) 06/04/2021   Aortic stenosis 04/02/2021   History of anemia 01/27/2021   Class 1 obesity with serious comorbidity and body mass index (BMI) of 30.0 to 30.9 in adult 02/13/2019   Heartburn 03/24/2016   OSA on CPAP 08/02/2015   Controlled diabetes mellitus with circulatory complication, without long-term current use of insulin  (HCC) 04/29/2014   Acute blood loss anemia 02/13/2013   Hypotestosteronism 01/02/2013   Type 2 diabetes mellitus without complication 01/01/2013   Dyslipidemia, goal  LDL below 70 10/15/2012   Obesity (BMI 30.0-34.9) 10/02/2012   Iron deficiency anemia 09/29/2012   Kidney disease 09/29/2012   Microscopic hematuria 04/04/2012   AV block, 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 Duanne, MD  REFERRING PROVIDER: Victory Gunnels, MD  REFERRING DIAG: [phone] (ICD-10-CM) - Spinal stenosis, lumbar region with neurogenic claudication  Rationale for Evaluation and Treatment: Rehabilitation  THERAPY DIAG:  Other low back pain  Abnormal posture  Difficulty in walking, not elsewhere classified  Muscle weakness (generalized)  ONSET DATE: 3 to 4 months ago  SUBJECTIVE:                                                                                                                                                                                           SUBJECTIVE STATEMENT: Patient endorsing feeling achy this morning and believes that the seated hamstring stretch is causing an increase in discomfort.  PERTINENT HISTORY:  Significant cardiac history including chronic heart failure, stage II chronic kidney disease, diabetes, previous shoulder arthroscopy, former smoker for 50 years  PAIN:  Are you having pain? Yes: NPRS scale: 7/10 Pain location: Low back and mostly anterior thighs Pain description: Sore, like I'm being punched in my back Aggravating factors: Standing and walking, particularly when bent forward Relieving factors: Grabbing a cart when walking helps, change of position  PRECAUTIONS: Back  RED FLAGS: Be aware of significant cardiac history   WEIGHT BEARING RESTRICTIONS: No  FALLS:  Has patient fallen in last 6 months? No  LIVING ENVIRONMENT: Lives with: lives with their family and lives with their spouse Lives in: House/apartment Stairs: Difficulty ascending Has following equipment at home: None  OCCUPATION: Retired  PLOF: Independent  PATIENT GOALS: Return to normal house chores (dishes) with out pain  NEXT MD VISIT: 12/25/2023  OBJECTIVE:  Note: Objective measures were completed at Evaluation unless otherwise noted.  DIAGNOSTIC FINDINGS:  1. Bulky  spondylitic spurring with multilevel ankylosis. 2. Focal foraminal impingement at T8-9 on the left, and open level by chest CT in 2024, with degeneration. The spinal canal is widely patent.   M48.062 (ICD-10-CM) - Spinal stenosis, lumbar region with neurogenic claudication  PATIENT SURVEYS:  PSFS: THE PATIENT SPECIFIC FUNCTIONAL  SCALE  Place score of 0-10 (0 = unable to perform activity and 10 = able to perform activity at the same level as before injury or problem)  Activity Date: 12/01/2023     Standing 2    2.  Walking 2    3.     4.      Total Score 2      Total Score = Sum of activity scores/number of activities  Minimally Detectable Change: 3 points (for single activity); 2 points (for average score)  [facility] (nd). The Patient Specific Functional Scale . Retrieved from [email or web address]   COGNITION: Overall cognitive status: Within functional limits for tasks assessed     SENSATION: Only thigh pain with prolonged standing  MUSCLE LENGTH: Hamstrings: Right 30 deg; Left 30 deg  POSTURE: rounded shoulders, forward head, decreased lumbar lordosis, and flexed trunk   LUMBAR ROM:   AROM 12/01/2023  Flexion   Extension 5  Right lateral flexion   Left lateral flexion 20  Right rotation 20  Left rotation    (Blank rows = not tested)  LOWER EXTREMITY ROM:     Passive  Left/Right 12/01/2023   Hip flexion 85/80   Hip extension    Hip abduction    Hip adduction    Hip internal rotation 8/4   Hip external rotation 18/26   Knee flexion    Knee extension    Ankle dorsiflexion    Ankle plantarflexion    Ankle inversion    Ankle eversion     (Blank rows = not tested)  STRENGTH:  Deferred at evaluation  MMT Right / Left 12/13/23   Hip flexion 40.1 / 39.5   Hip extension    Hip abduction    Hip adduction    Hip internal rotation    Hip external rotation    Knee flexion    Knee extension  59.8 / 62.2   Ankle dorsiflexion    Ankle plantarflexion    Ankle inversion    Ankle eversion     (Blank rows = not tested)  GAIT: Distance walked: 50 feet Assistive device utilized: None Level of assistance: Complete Independence Comments: Devantae ambulates in a flexed posture and notes he has increases in back pain almost immediately when standing and walking  TREATMENT DATE:   12/20/2023 TherEx:  Nustep level 5 for 8 minutes  Supine single knee to chest 2x30s each side  Supine hamstring stretch with strap 2x30s each side  Added to HEP to replace seated hamstring stretch to decrease pain/soreness  Supine hip flexor stretch with leg off the side 2x30s each side  Sidelying clamshells 2x12 each side with red TB around knees  Supine bridge with red TB around knees 2x12 with 2-3s hold  Supine figure 4 2x30s each side  Touchdown squats 2x10 with 5# DB    12/15/2023 TherEx:  Nustep level 5 for 8 minutes  Lateral shift in doorway 2x30s each direction Seated hamstring stretch 3x30s each leg  Prone mini cobra 1x5 with 5-8s holds  Supine bridge with red TB 1x10 with 3s hold   TherAct:  Bilat leg press 3x10 with 75# Unilat leg press 2x10 with 43# each leg    12/13/23: TherEx Seated hip IR: x 10, with red TB around ankles x 10 Seated clam shells 2 x 10 c green TB   Seated SLR 2 x 10 bil LE Seated hamstring stretch: 3 x each LE holding 30 sec Standing hip flexor stretch x  3 holding 30 sec Later shift in door way x 3  TherAct Nustep: level 5 seat 10, arms 11, x 7 minutes Double Leg Press: 75# 3 x 10  Single Leg Press: 43#  x 15  Standing lumbar extension at wall x 10 c 5 sec hold                12/01/2023 Standing gentle lumbar extension AROM, hands of low to include the entire lumbar spine and instructions to stop with any increase in peripheral symptoms (none at evaluation) 10 x 3 seconds Single knee-to-chest stretch with other leg straight 4 x 20 seconds Supine hamstrings  stretch with other leg straight 4 x 20 seconds  [id number]: Review of examination findings; day 1 home exercise program; review of imaging; review of spine anatomy using the spine model and the importance of avoiding increasing peripheral symptoms                                                                     PATIENT EDUCATION:  Education details: See above Person educated: Patient Education method: Explanation, Demonstration, Tactile cues, Verbal cues, and Handouts Education comprehension: verbalized understanding, returned demonstration, verbal cues required, tactile cues required, and needs further education  HOME EXERCISE PROGRAM:  Access Code: [email or web address] URL: [email or web address] Date: 12/20/2023 Prepared by: Susannah Daring  Exercises - Standing Lumbar Extension at Wall - Forearms  - 5 x daily - 7 x weekly - 1 sets - 5 reps - 3 seconds hold - Single Knee to Chest Stretch  - 2-3 x daily - 7 x weekly - 1 sets - 5 reps - 20 seconds hold - Seated Hamstring Stretch  - 2-3 x daily - 7 x weekly - 3-5 reps - 30 seconds hold - Lateral Shift Correction at Wall  - 2 x daily - 7 x weekly - 3 reps - 20 seconds hold - Standing Hip Flexor Stretch  - 1 x daily - 7 x weekly - 3 sets - 3 reps - 20 seconds hold - Supine Hamstring Stretch with Strap  - 1 x daily - 7 x weekly - 3 sets - 30s hold                                     ASSESSMENT:  CLINICAL IMPRESSION: Patient arrived to session noting increase in symptoms with certain exercises, specifically the seated hamstring stretch. Patient tolerated all activities this date noting some increase in low back pain with scooting in supine from one side of the mat table to the other. PT updated HEP to include supine hamstring stretch to replace seated. Patient will continue to benefit from skilled PT.    OBJECTIVE IMPAIRMENTS: Abnormal gait, decreased activity tolerance, decreased endurance, decreased knowledge of condition, difficulty  walking, decreased ROM, decreased strength, decreased safety awareness, impaired perceived functional ability, impaired flexibility, improper body mechanics, postural dysfunction, and pain.   ACTIVITY LIMITATIONS: carrying, lifting, bending, standing, stairs, bed mobility, and locomotion level  PARTICIPATION LIMITATIONS: community activity  PERSONAL FACTORS: Significant cardiac history including chronic heart failure, stage II chronic kidney disease, diabetes, previous shoulder arthroscopy, former smoker for 50 years are also affecting patient's functional  outcome.   REHAB POTENTIAL: Good  CLINICAL DECISION MAKING: Stable/uncomplicated  EVALUATION COMPLEXITY: Low   GOALS: Goals reviewed with patient? Yes  SHORT TERM GOALS: Target date: 12/29/2023  Jeremias will be independent with his day 1 home exercise program Baseline: Started 12/01/2023 Goal status: ongoing 12/13/23  2.  Improve pain-free lumbar extension AROM without an increase in radicular symptoms to 10 degrees Baseline: 5 degrees Goal status: INITIAL  3.  Improve bilateral lower extremity flexibility for hip flexion to 90 degrees; hamstrings to 40 degrees and hip ER to 25 degrees Baseline: 85/80; 30/30 and 18/26 respectively Goal status: INITIAL   LONG TERM GOALS: Target date: 01/26/2024  Improve patient-specific functional score to at least 5 Baseline: 2 Goal status: INITIAL  2.  Farrell will report low back pain no greater than 3/10 on the numeric pain rating scale Baseline: 3-6/10 with symptoms in the mostly anterior thigh as well Goal status: INITIAL  3.  Improve low back strength as assessed by [place]wood ability to stand and walk for 10 or more minutes without an increase in symptoms Baseline: Symptoms almost immediately when standing and walking Goal status: INITIAL  4.  Cyncere will be independent with his long-term maintenance home exercise program at discharge Baseline: Started 12/01/2023 Goal status:  INITIAL   PLAN:  PT FREQUENCY: 1-2x/week  PT DURATION: 8 weeks  PLANNED INTERVENTIONS: 97110-Therapeutic exercises, 97530- Therapeutic activity, 97535- Self Care, [id number]- Manual therapy, [id number]- Gait training, [phone]- Electrical stimulation (unattended), [id number]- Traction (mechanical), 20560 (1-2 muscles), 20561 (3+ muscles)- Dry Needling, Patient/Family education, Spinal mobilization, Cryotherapy, and Moist heat.  PLAN FOR NEXT SESSION:   core strengthening, lumbar and LE stretching as tolerated, mobs as needed   Susannah Daring, PT, DPT 12/20/23 8:42 AM

## 2023-12-20 ENCOUNTER — Ambulatory Visit

## 2023-12-20 DIAGNOSIS — R262 Difficulty in walking, not elsewhere classified: Secondary | ICD-10-CM

## 2023-12-20 DIAGNOSIS — M6281 Muscle weakness (generalized): Secondary | ICD-10-CM

## 2023-12-20 DIAGNOSIS — M5459 Other low back pain: Secondary | ICD-10-CM

## 2023-12-20 DIAGNOSIS — R293 Abnormal posture: Secondary | ICD-10-CM | POA: Diagnosis not present

## 2023-12-21 NOTE — Therapy (Signed)
 OUTPATIENT PHYSICAL THERAPY THORACOLUMBAR TREATMENT   Patient Name: James Jones MRN: [id number] DOB:05-May-1945, 78 y.o., male Today's Date: 12/22/2023  END OF SESSION:  PT End of Session - 12/22/23 0831     Visit Number 5    Number of Visits 16    Date for Recertification  01/26/24    Authorization Type Medicare    Progress Note Due on Visit 10    PT Start Time 0838    PT Stop Time 0919    PT Time Calculation (min) 41 min    Activity Tolerance Patient tolerated treatment well;No increased pain;Patient limited by pain    Behavior During Therapy [facility] for tasks assessed/performed          Past Medical History:  Diagnosis Date   Aortic stenosis 04/02/2021   Chronic diastolic heart failure (HCC)    CKD (chronic kidney disease) stage 2, GFR 60-89 ml/min 03/17/2012   Coronary artery disease, non-occlusive 03/18/2012   60-70% stenosis in RCA -- FFR 0.84; EF 60-65%   Diabetes mellitus without complication (HCC)    GERD (gastroesophageal reflux disease)    On PPI   History of GI bleed 02/2013   No obvious findings on EGD/colonoscopy   Hydrocele, bilateral    large on US  2020   Hyperlipidemia    Hypertension    Obesity (BMI 30.0-34.9) 10/02/2012   OSA (obstructive sleep apnea) 03/18/2012   Doing better with CPAP   Pulmonary hypertension (HCC) 12/10/2010   ECHO:  Mild PH,mild LVH; PA pressures estimated 30-40 mmHg   Pulmonary hypertension, unspecified (HCC) 06/04/2021   RBBB, 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   Event monitor; usually during sleeping hours; therefore not on beta blocker   Past Surgical History:  Procedure Laterality Date   APPENDECTOMY  1974   CARPAL TUNNEL RELEASE     EYE SURGERY  1960   Left eye   HEMORRHOID SURGERY     LEFT HEART CATH AND CORONARY ANGIOGRAPHY  03/27/2010   Moderate mid RCA lesion,right radial approach,normal EF   LEFT HEART CATHETERIZATION WITH CORONARY ANGIOGRAM N/A 03/17/2012   Procedure: LEFT HEART CATHETERIZATION  WITH CORONARY ANGIOGRAM;  Surgeon: Alm LELON Clay, MD;  Location: [facility] CATH LAB;  Service: Cardiovascular: Fractional Flow Reserve Measurement of mid RCA 60-70% stenosis = 0.84. Otherwise mild LCA CAD.  Normal EF & EDP   SHOULDER ARTHROSCOPY  2006   TRANSTHORACIC ECHOCARDIOGRAM  12/2010    Mild concentric LVH.  EF> 55%.  GR 1 DD.  Mild aortic sclerosis no stenosis.  PA pressures estimated 30-40 mmHg   Patient Active Problem List   Diagnosis Date Noted   Lipoma of torso 05/10/2023   Screen for colon cancer 08/31/2022   Ganglion cyst of wrist, right 02/16/2022   Pain in right hand 02/03/2022   Atypical chest pain 10/05/2021   GERD (gastroesophageal reflux disease)    Chronic diastolic heart failure (HCC)    Stage 3b chronic kidney disease (CKD) (HCC)    Pulmonary hypertension, unspecified (HCC) 06/04/2021   Aortic stenosis 04/02/2021   History of anemia 01/27/2021   Class 1 obesity with serious comorbidity and body mass index (BMI) of 30.0 to 30.9 in adult 02/13/2019   Heartburn 03/24/2016   OSA on CPAP 08/02/2015   Controlled diabetes mellitus with circulatory complication, without long-term current use of insulin  (HCC) 04/29/2014   Acute blood loss anemia 02/13/2013   Hypotestosteronism 01/02/2013   Type 2 diabetes mellitus without complication 01/01/2013   Dyslipidemia, goal LDL below 70  10/15/2012   Obesity (BMI 30.0-34.9) 10/02/2012   Iron deficiency anemia 09/29/2012   Kidney disease 09/29/2012   Microscopic hematuria 04/04/2012   AV block, 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 Duanne, MD  REFERRING PROVIDER: Victory Gunnels, MD  REFERRING DIAG: [phone] (ICD-10-CM) - Spinal stenosis, lumbar region with neurogenic claudication  Rationale for Evaluation and Treatment: Rehabilitation  THERAPY DIAG:  Other low back pain  Abnormal posture  Difficulty in walking, not elsewhere classified  Muscle weakness (generalized)  ONSET DATE: 3 to 4 months ago  SUBJECTIVE:                                                                                                                                                                                           SUBJECTIVE STATEMENT: Patient endorsing no change in symptoms.  PERTINENT HISTORY:  Significant cardiac history including chronic heart failure, stage II chronic kidney disease, diabetes, previous shoulder arthroscopy, former smoker for 50 years  PAIN:  Are you having pain? Yes: NPRS scale: 4/10 Pain location: Low back and mostly anterior thighs Pain description: Sore, like I'm being punched in my back Aggravating factors: Standing and walking, particularly when bent forward Relieving factors: Grabbing a cart when walking helps, change of position  PRECAUTIONS: Back  RED FLAGS: Be aware of significant cardiac history   WEIGHT BEARING RESTRICTIONS: No  FALLS:  Has patient fallen in last 6 months? No  LIVING ENVIRONMENT: Lives with: lives with their family and lives with their spouse Lives in: House/apartment Stairs: Difficulty ascending Has following equipment at home: None  OCCUPATION: Retired  PLOF: Independent  PATIENT GOALS: Return to normal house chores (dishes) with out pain  NEXT MD VISIT: 12/25/2023  OBJECTIVE:  Note: Objective measures were completed at Evaluation unless otherwise noted.  DIAGNOSTIC FINDINGS:  1. Bulky spondylitic spurring with multilevel ankylosis. 2. Focal foraminal impingement at T8-9 on the left, and open  level by chest CT in 2024, with degeneration. The spinal canal is widely patent.   M48.062 (ICD-10-CM) - Spinal stenosis, lumbar region with neurogenic claudication  PATIENT SURVEYS:  PSFS: THE PATIENT SPECIFIC FUNCTIONAL SCALE  Place score of 0-10 (0 = unable to perform activity and 10 = able  to perform activity at the same level as before injury or problem)  Activity Date: 12/01/2023     Standing 2    2.  Walking 2    3.     4.      Total Score 2      Total Score = Sum of activity scores/number of activities  Minimally Detectable Change: 3 points (for single activity); 2 points (for average score)  [facility] (nd). The Patient Specific Functional Scale . Retrieved from [email or web address]   COGNITION: Overall cognitive status: Within functional limits for tasks assessed     SENSATION: Only thigh pain with prolonged standing  MUSCLE LENGTH: Hamstrings: Right 30 deg; Left 30 deg  POSTURE: rounded shoulders, forward head, decreased lumbar lordosis, and flexed trunk   LUMBAR ROM:   AROM 12/01/2023  Flexion   Extension 5  Right lateral flexion   Left lateral flexion 20  Right rotation 20  Left rotation    (Blank rows = not tested)  LOWER EXTREMITY ROM:     Passive  Left/Right 12/01/2023   Hip flexion 85/80   Hip extension    Hip abduction    Hip adduction    Hip internal rotation 8/4   Hip external rotation 18/26   Knee flexion    Knee extension    Ankle dorsiflexion    Ankle plantarflexion    Ankle inversion    Ankle eversion     (Blank rows = not tested)  STRENGTH:  Deferred at evaluation  MMT Right / Left 12/13/23   Hip flexion 40.1 / 39.5   Hip extension    Hip abduction    Hip adduction    Hip internal rotation    Hip external rotation    Knee flexion    Knee extension 59.8 / 62.2   Ankle dorsiflexion    Ankle plantarflexion    Ankle inversion    Ankle eversion     (Blank  rows = not tested)  GAIT: Distance walked: 50 feet Assistive device utilized: None Level of assistance: Complete Independence Comments: Samanyu ambulates in a flexed posture and notes he has increases in back pain almost immediately when standing and walking  TREATMENT DATE:   12/22/2023 TherEx:  UBE with bilat UE and LE level 2.5 for 4 min fwd/4 min back  Slant board gastroc stretch 3x30s  Supine single knee to chest 2x30s each side Seated figure 4 2x30s each side   TherAct:  Bilat leg press 3x12 with 81#  Unilat leg press 2x12 with 43#   Neuro Re-Ed:  Paloff press for core activation and antirotation 2x12 facing each direction  Supine SLR with TA activation focus 2x10 each leg with 3# ankle weights  Supine reverse curls with yellow ball between knees 1x10 ; discontinued secondary to causing pain in low back/hips Supine bridge with yellow ball between knees to activate core and pelvic floor 2x10   12/20/2023 TherEx:  Nustep level 5 for 8 minutes  Supine single knee to chest 2x30s each side  Supine hamstring stretch with strap 2x30s each side  Added to HEP to replace seated hamstring stretch to decrease pain/soreness  Supine hip flexor stretch with leg off the side 2x30s each side  Sidelying clamshells 2x12 each side with red TB around knees  Supine bridge with red TB around knees 2x12 with 2-3s hold  Supine figure 4 2x30s each side  Touchdown squats 2x10 with 5# DB    12/15/2023 TherEx:  Nustep level  5 for 8 minutes  Lateral shift in doorway 2x30s each direction Seated hamstring stretch 3x30s each leg  Prone mini cobra 1x5 with 5-8s holds  Supine bridge with red TB 1x10 with 3s hold   TherAct:  Bilat leg press 3x10 with 75# Unilat leg press 2x10 with 43# each leg    12/13/23: TherEx Seated hip IR: x 10, with red TB around ankles x 10 Seated clam shells 2 x 10 c green TB   Seated SLR 2 x 10 bil LE Seated hamstring stretch: 3 x each LE holding 30 sec Standing hip  flexor stretch x 3 holding 30 sec Later shift in door way x 3  TherAct Nustep: level 5 seat 10, arms 11, x 7 minutes Double Leg Press: 75# 3 x 10  Single Leg Press: 43#  x 15  Standing lumbar extension at wall x 10 c 5 sec hold                                                                           PATIENT EDUCATION:  Education details: See above Person educated: Patient Education method: Explanation, Demonstration, Tactile cues, Verbal cues, and Handouts Education comprehension: verbalized understanding, returned demonstration, verbal cues required, tactile cues required, and needs further education  HOME EXERCISE PROGRAM:  Access Code: [email or web address] URL: [email or web address] Date: 12/20/2023 Prepared by: Susannah Daring  Exercises - Standing Lumbar Extension at Wall - Forearms  - 5 x daily - 7 x weekly - 1 sets - 5 reps - 3 seconds hold - Single Knee to Chest Stretch  - 2-3 x daily - 7 x weekly - 1 sets - 5 reps - 20 seconds hold - Seated Hamstring Stretch  - 2-3 x daily - 7 x weekly - 3-5 reps - 30 seconds hold - Lateral Shift Correction at Wall  - 2 x daily - 7 x weekly - 3 reps - 20 seconds hold - Standing Hip Flexor Stretch  - 1 x daily - 7 x weekly - 3 sets - 3 reps - 20 seconds hold - Supine Hamstring Stretch with Strap  - 1 x daily - 7 x weekly - 3 sets - 30s hold                                     ASSESSMENT:  CLINICAL IMPRESSION: Patient arrived to session noting no change in symptoms overall, but feeling slightly less sore compared to last session. Patient tolerated all activities this date, except for reverse crunches with yellow ball between knees, and endorsed improvement with symptoms during supine hamstring stretch at home. Patient will continue to benefit from skilled PT.    OBJECTIVE IMPAIRMENTS: Abnormal gait, decreased activity tolerance, decreased endurance, decreased knowledge of condition, difficulty walking, decreased ROM, decreased strength,  decreased safety awareness, impaired perceived functional ability, impaired flexibility, improper body mechanics, postural dysfunction, and pain.   ACTIVITY LIMITATIONS: carrying, lifting, bending, standing, stairs, bed mobility, and locomotion level  PARTICIPATION LIMITATIONS: community activity  PERSONAL FACTORS: Significant cardiac history including chronic heart failure, stage II chronic kidney disease, diabetes, previous shoulder arthroscopy, former smoker for 50 years are also affecting  patient's functional outcome.   REHAB POTENTIAL: Good  CLINICAL DECISION MAKING: Stable/uncomplicated  EVALUATION COMPLEXITY: Low   GOALS: Goals reviewed with patient? Yes  SHORT TERM GOALS: Target date: 12/29/2023  [place]bury will be independent with his day 1 home exercise program Baseline: Started 12/01/2023 Goal status: ongoing 12/13/23  2.  Improve pain-free lumbar extension AROM without an increase in radicular symptoms to 10 degrees Baseline: 5 degrees Goal status: INITIAL  3.  Improve bilateral lower extremity flexibility for hip flexion to 90 degrees; hamstrings to 40 degrees and hip ER to 25 degrees Baseline: 85/80; 30/30 and 18/26 respectively Goal status: INITIAL   LONG TERM GOALS: Target date: 01/26/2024  Improve patient-specific functional score to at least 5 Baseline: 2 Goal status: INITIAL  2.  Reese will report low back pain no greater than 3/10 on the numeric pain rating scale Baseline: 3-6/10 with symptoms in the mostly anterior thigh as well Goal status: INITIAL  3.  Improve low back strength as assessed by [place] ability to stand and walk for 10 or more minutes without an increase in symptoms Baseline: Symptoms almost immediately when standing and walking Goal status: INITIAL  4.  Skylen will be independent with his long-term maintenance home exercise program at discharge Baseline: Started 12/01/2023 Goal status: INITIAL   PLAN:  PT FREQUENCY: 1-2x/week  PT  DURATION: 8 weeks  PLANNED INTERVENTIONS: 97110-Therapeutic exercises, 97530- Therapeutic activity, 97535- Self Care, [id number]- Manual therapy, [id number]- Gait training, [phone]- Electrical stimulation (unattended), [id number]- Traction (mechanical), 20560 (1-2 muscles), [id number] (3+ muscles)- Dry Needling, Patient/Family education, Spinal mobilization, Cryotherapy, and Moist heat.  PLAN FOR NEXT SESSION:    core strengthening, lumbar and LE stretching as tolerated, mobs as needed   Susannah Daring, PT, DPT 12/22/23 9:24 AM

## 2023-12-22 ENCOUNTER — Ambulatory Visit (INDEPENDENT_AMBULATORY_CARE_PROVIDER_SITE_OTHER)

## 2023-12-22 DIAGNOSIS — R293 Abnormal posture: Secondary | ICD-10-CM

## 2023-12-22 DIAGNOSIS — M5459 Other low back pain: Secondary | ICD-10-CM | POA: Diagnosis not present

## 2023-12-22 DIAGNOSIS — R262 Difficulty in walking, not elsewhere classified: Secondary | ICD-10-CM

## 2023-12-22 DIAGNOSIS — M6281 Muscle weakness (generalized): Secondary | ICD-10-CM

## 2023-12-27 ENCOUNTER — Encounter

## 2023-12-27 DIAGNOSIS — N1832 Chronic kidney disease, stage 3b: Secondary | ICD-10-CM | POA: Diagnosis not present

## 2023-12-27 DIAGNOSIS — E1122 Type 2 diabetes mellitus with diabetic chronic kidney disease: Secondary | ICD-10-CM | POA: Diagnosis not present

## 2023-12-27 DIAGNOSIS — I272 Pulmonary hypertension, unspecified: Secondary | ICD-10-CM | POA: Diagnosis not present

## 2023-12-27 DIAGNOSIS — E872 Acidosis, unspecified: Secondary | ICD-10-CM | POA: Diagnosis not present

## 2023-12-27 DIAGNOSIS — R809 Proteinuria, unspecified: Secondary | ICD-10-CM | POA: Diagnosis not present

## 2023-12-27 DIAGNOSIS — K219 Gastro-esophageal reflux disease without esophagitis: Secondary | ICD-10-CM | POA: Diagnosis not present

## 2023-12-27 DIAGNOSIS — I129 Hypertensive chronic kidney disease with stage 1 through stage 4 chronic kidney disease, or unspecified chronic kidney disease: Secondary | ICD-10-CM | POA: Diagnosis not present

## 2023-12-28 NOTE — Therapy (Signed)
 OUTPATIENT PHYSICAL THERAPY THORACOLUMBAR TREATMENT   Patient Name: James Jones MRN: [id number] DOB:1945-11-07, 78 y.o., male Today's Date: 12/29/2023  END OF SESSION:  PT End of Session - 12/29/23 0751     Visit Number 6    Number of Visits 16    Date for Recertification  01/26/24    Authorization Type Medicare    Progress Note Due on Visit 10    PT Start Time 0802    PT Stop Time 0830    PT Time Calculation (min) 28 min    Activity Tolerance Patient tolerated treatment well;No increased pain;Patient limited by pain    Behavior During Therapy [facility] for tasks assessed/performed           Past Medical History:  Diagnosis Date   Aortic stenosis 04/02/2021   Chronic diastolic heart failure (HCC)    CKD (chronic kidney disease) stage 2, GFR 60-89 ml/min 03/17/2012   Coronary artery disease, non-occlusive 03/18/2012   60-70% stenosis in RCA -- FFR 0.84; EF 60-65%   Diabetes mellitus without complication (HCC)    GERD (gastroesophageal reflux disease)    On PPI   History of GI bleed 02/2013   No obvious findings on EGD/colonoscopy   Hydrocele, bilateral    large on US  2020   Hyperlipidemia    Hypertension    Obesity (BMI 30.0-34.9) 10/02/2012   OSA (obstructive sleep apnea) 03/18/2012   Doing better with CPAP   Pulmonary hypertension (HCC) 12/10/2010   ECHO:  Mild PH,mild LVH; PA pressures estimated 30-40 mmHg   Pulmonary hypertension, unspecified (HCC) 06/04/2021   RBBB, 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   Event monitor; usually during sleeping hours; therefore not on beta blocker   Past Surgical History:  Procedure Laterality Date   APPENDECTOMY  1974   CARPAL TUNNEL RELEASE     EYE SURGERY  1960   Left eye   HEMORRHOID SURGERY     LEFT HEART CATH AND CORONARY ANGIOGRAPHY  03/27/2010   Moderate mid RCA lesion,right radial approach,normal EF   LEFT HEART CATHETERIZATION WITH CORONARY ANGIOGRAM N/A 03/17/2012   Procedure: LEFT HEART  CATHETERIZATION WITH CORONARY ANGIOGRAM;  Surgeon: Alm LELON Clay, MD;  Location: [facility] CATH LAB;  Service: Cardiovascular: Fractional Flow Reserve Measurement of mid RCA 60-70% stenosis = 0.84. Otherwise mild LCA CAD.  Normal EF & EDP   SHOULDER ARTHROSCOPY  2006   TRANSTHORACIC ECHOCARDIOGRAM  12/2010    Mild concentric LVH.  EF> 55%.  GR 1 DD.  Mild aortic sclerosis no stenosis.  PA pressures estimated 30-40 mmHg   Patient Active Problem List   Diagnosis Date Noted   Lipoma of torso 05/10/2023   Screen for colon cancer 08/31/2022   Ganglion cyst of wrist, right 02/16/2022   Pain in right hand 02/03/2022   Atypical chest pain 10/05/2021   GERD (gastroesophageal reflux disease)    Chronic diastolic heart failure (HCC)    Stage 3b chronic kidney disease (CKD) (HCC)    Pulmonary hypertension, unspecified (HCC) 06/04/2021   Aortic stenosis 04/02/2021   History of anemia 01/27/2021   Class 1 obesity with serious comorbidity and body mass index (BMI) of 30.0 to 30.9 in adult 02/13/2019   Heartburn 03/24/2016   OSA on CPAP 08/02/2015   Controlled diabetes mellitus with circulatory complication, without long-term current use of insulin  (HCC) 04/29/2014   Acute blood loss anemia 02/13/2013   Hypotestosteronism 01/02/2013   Type 2 diabetes mellitus without complication 01/01/2013   Dyslipidemia, goal LDL below  70 10/15/2012   Obesity (BMI 30.0-34.9) 10/02/2012   Iron deficiency anemia 09/29/2012   Kidney disease 09/29/2012   Microscopic hematuria 04/04/2012   AV block, 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 Duanne, MD  REFERRING PROVIDER: Victory Gunnels, MD  REFERRING DIAG: [phone] (ICD-10-CM) - Spinal stenosis, lumbar region with neurogenic claudication  Rationale for Evaluation and Treatment: Rehabilitation  THERAPY DIAG:  Abnormal posture  Difficulty in walking, not elsewhere classified  Muscle weakness (generalized)  Other low back pain  ONSET DATE: 3 to 4 months ago  SUBJECTIVE:                                                                                                                                                                                           SUBJECTIVE STATEMENT: Patient reporting increase in pain throughout the week that is now radiating into Rt arm and bilat LEs.  PERTINENT HISTORY:  Significant cardiac history including chronic heart failure, stage II chronic kidney disease, diabetes, previous shoulder arthroscopy, former smoker for 50 years  PAIN:  Are you having pain? Yes: NPRS scale: 14/10 Pain location: Low back and mostly anterior thighs Pain description: Sore, like I'm being punched in my back Aggravating factors: Standing and walking, particularly when bent forward Relieving factors: Grabbing a cart when walking helps, change of position  PRECAUTIONS: Back  RED FLAGS: Be aware of significant cardiac history   WEIGHT BEARING RESTRICTIONS: No  FALLS:  Has patient fallen in last 6 months? No  LIVING ENVIRONMENT: Lives with: lives with their family and lives with their spouse Lives in: House/apartment Stairs: Difficulty ascending Has following equipment at home: None  OCCUPATION: Retired  PLOF: Independent  PATIENT GOALS: Return to normal house chores (dishes) with out pain  NEXT MD VISIT: 12/25/2023  OBJECTIVE:  Note: Objective measures were completed at Evaluation unless otherwise noted.  DIAGNOSTIC FINDINGS:  1. Bulky spondylitic spurring with  multilevel ankylosis. 2. Focal foraminal impingement at T8-9 on the left, and open level by chest CT in 2024, with degeneration. The spinal canal is widely patent.   M48.062 (ICD-10-CM) - Spinal stenosis, lumbar region with neurogenic claudication  PATIENT SURVEYS:  PSFS: THE PATIENT SPECIFIC FUNCTIONAL SCALE  Place  score of 0-10 (0 = unable to perform activity and 10 = able to perform activity at the same level as before injury or problem)  Activity Date: 12/01/2023     Standing 2    2.  Walking 2    3.     4.      Total Score 2      Total Score = Sum of activity scores/number of activities  Minimally Detectable Change: 3 points (for single activity); 2 points (for average score)  [facility] (nd). The Patient Specific Functional Scale . Retrieved from [email or web address]   COGNITION: Overall cognitive status: Within functional limits for tasks assessed     SENSATION: Only thigh pain with prolonged standing  MUSCLE LENGTH: Hamstrings: Right 30 deg; Left 30 deg  POSTURE: rounded shoulders, forward head, decreased lumbar lordosis, and flexed trunk   LUMBAR ROM:   AROM 12/01/2023  Flexion   Extension 5  Right lateral flexion   Left lateral flexion 20  Right rotation 20  Left rotation    (Blank rows = not tested)  LOWER EXTREMITY ROM:     Passive  Left/Right 12/01/2023   Hip flexion 85/80   Hip extension    Hip abduction    Hip adduction    Hip internal rotation 8/4   Hip external rotation 18/26   Knee flexion    Knee extension    Ankle dorsiflexion    Ankle plantarflexion    Ankle inversion    Ankle eversion     (Blank rows = not tested)  STRENGTH:  Deferred at evaluation  MMT Right / Left 12/13/23   Hip flexion 40.1 / 39.5   Hip extension    Hip abduction    Hip adduction    Hip internal rotation    Hip external rotation    Knee flexion    Knee extension 59.8 / 62.2   Ankle  dorsiflexion    Ankle plantarflexion    Ankle inversion    Ankle eversion     (Blank rows = not tested)  GAIT: Distance walked: 50 feet Assistive device utilized: None Level of assistance: Complete Independence Comments: Arkin ambulates in a flexed posture and notes he has increases in back pain almost immediately when standing and walking  TREATMENT DATE:   12/29/2023 Manual:  IASTM with percussive device to bilat QL, bilat lumbar musculature, and bilat thoracic musculature  Grade 2 mods to lumbar spine   TherEx:  Lower trunk rotations 1x8 with 10-15s holds  PT discussed importance of mobility, following up with MD, POC options    12/22/2023 TherEx:  UBE with bilat UE and LE level 2.5 for 4 min fwd/4 min back  Slant board gastroc stretch 3x30s  Supine single knee to chest 2x30s each side Seated figure 4 2x30s each side   TherAct:  Bilat leg press 3x12 with 81#  Unilat leg press 2x12 with 43#   Neuro Re-Ed:  Paloff press for core activation and antirotation 2x12 facing each direction  Supine SLR with TA activation focus 2x10 each leg with 3# ankle weights  Supine reverse curls with yellow ball between knees 1x10 ; discontinued secondary to causing pain in low back/hips Supine bridge with yellow ball between knees to activate core and pelvic floor 2x10   12/20/2023 TherEx:  Nustep level 5 for 8 minutes  Supine single knee to chest 2x30s each side  Supine hamstring stretch with strap 2x30s each side  Added to HEP to replace seated  hamstring stretch to decrease pain/soreness  Supine hip flexor stretch with leg off the side 2x30s each side  Sidelying clamshells 2x12 each side with red TB around knees  Supine bridge with red TB around knees 2x12 with 2-3s hold  Supine figure 4 2x30s each side  Touchdown squats 2x10 with 5# DB    12/15/2023 TherEx:  Nustep level 5 for 8 minutes  Lateral shift in doorway 2x30s each direction Seated hamstring stretch 3x30s each leg   Prone mini cobra 1x5 with 5-8s holds  Supine bridge with red TB 1x10 with 3s hold   TherAct:  Bilat leg press 3x10 with 75# Unilat leg press 2x10 with 43# each leg                                                                  PATIENT EDUCATION:  Education details: See above Person educated: Patient Education method: Explanation, Demonstration, Tactile cues, Verbal cues, and Handouts Education comprehension: verbalized understanding, returned demonstration, verbal cues required, tactile cues required, and needs further education  HOME EXERCISE PROGRAM:  Access Code: [email or web address] URL: [email or web address] Date: 12/20/2023 Prepared by: Susannah Daring  Exercises - Standing Lumbar Extension at Wall - Forearms  - 5 x daily - 7 x weekly - 1 sets - 5 reps - 3 seconds hold - Single Knee to Chest Stretch  - 2-3 x daily - 7 x weekly - 1 sets - 5 reps - 20 seconds hold - Seated Hamstring Stretch  - 2-3 x daily - 7 x weekly - 3-5 reps - 30 seconds hold - Lateral Shift Correction at Wall  - 2 x daily - 7 x weekly - 3 reps - 20 seconds hold - Standing Hip Flexor Stretch  - 1 x daily - 7 x weekly - 3 sets - 3 reps - 20 seconds hold - Supine Hamstring Stretch with Strap  - 1 x daily - 7 x weekly - 3 sets - 30s hold                                     ASSESSMENT:  CLINICAL IMPRESSION:  Patient arrived to session noting highly increased pain with increased peripheral symptoms. Patient tolerated manual but was unable to tolerate more exercises than LTR. Patient endorsed going to MD following session. Patient will continue to benefit from skilled PT.    OBJECTIVE IMPAIRMENTS: Abnormal gait, decreased activity tolerance, decreased endurance, decreased knowledge of condition, difficulty walking, decreased ROM, decreased strength, decreased safety awareness, impaired perceived functional ability, impaired flexibility, improper body mechanics, postural dysfunction, and pain.   ACTIVITY  LIMITATIONS: carrying, lifting, bending, standing, stairs, bed mobility, and locomotion level  PARTICIPATION LIMITATIONS: community activity  PERSONAL FACTORS: Significant cardiac history including chronic heart failure, stage II chronic kidney disease, diabetes, previous shoulder arthroscopy, former smoker for 50 years are also affecting patient's functional outcome.   REHAB POTENTIAL: Good  CLINICAL DECISION MAKING: Stable/uncomplicated  EVALUATION COMPLEXITY: Low   GOALS: Goals reviewed with patient? Yes  SHORT TERM GOALS: Target date: 12/29/2023  Kemond will be independent with his day 1 home exercise program Baseline: Started 12/01/2023 Goal status: ongoing 12/13/23  2.  Improve pain-free lumbar extension AROM  without an increase in radicular symptoms to 10 degrees Baseline: 5 degrees Goal status: INITIAL  3.  Improve bilateral lower extremity flexibility for hip flexion to 90 degrees; hamstrings to 40 degrees and hip ER to 25 degrees Baseline: 85/80; 30/30 and 18/26 respectively Goal status: INITIAL   LONG TERM GOALS: Target date: 01/26/2024  Improve patient-specific functional score to at least 5 Baseline: 2 Goal status: INITIAL  2.  Shahir will report low back pain no greater than 3/10 on the numeric pain rating scale Baseline: 3-6/10 with symptoms in the mostly anterior thigh as well Goal status: INITIAL  3.  Improve low back strength as assessed by [place] ability to stand and walk for 10 or more minutes without an increase in symptoms Baseline: Symptoms almost immediately when standing and walking Goal status: INITIAL  4.  Quadarius will be independent with his long-term maintenance home exercise program at discharge Baseline: Started 12/01/2023 Goal status: INITIAL   PLAN:  PT FREQUENCY: 1-2x/week  PT DURATION: 8 weeks  PLANNED INTERVENTIONS: 97110-Therapeutic exercises, 97530- Therapeutic activity, 97535- Self Care, [id number]- Manual therapy, [id number]- Gait  training, [phone]- Electrical stimulation (unattended), [id number]- Traction (mechanical), 20560 (1-2 muscles), 20561 (3+ muscles)- Dry Needling, Patient/Family education, Spinal mobilization, Cryotherapy, and Moist heat.  PLAN FOR NEXT SESSION:    core strengthening, lumbar and LE stretching as tolerated, mobs as needed   Susannah Daring, PT, DPT 12/29/23 8:40 AM

## 2023-12-29 ENCOUNTER — Ambulatory Visit (INDEPENDENT_AMBULATORY_CARE_PROVIDER_SITE_OTHER)

## 2023-12-29 DIAGNOSIS — M5459 Other low back pain: Secondary | ICD-10-CM

## 2023-12-29 DIAGNOSIS — M6281 Muscle weakness (generalized): Secondary | ICD-10-CM | POA: Diagnosis not present

## 2023-12-29 DIAGNOSIS — R262 Difficulty in walking, not elsewhere classified: Secondary | ICD-10-CM

## 2023-12-29 DIAGNOSIS — R293 Abnormal posture: Secondary | ICD-10-CM

## 2023-12-30 NOTE — Therapy (Signed)
 OUTPATIENT PHYSICAL THERAPY THORACOLUMBAR TREATMENT   Patient Name: James Jones MRN: [id number] DOB:[birth year], 78 y.o., male Today's Date: 01/02/2024  END OF SESSION:  PT End of Session - 01/02/24 0753     Visit Number 7    Number of Visits 16    Date for Recertification  01/26/24    Authorization Type Medicare    Progress Note Due on Visit 10    PT Start Time 0757    PT Stop Time 0839    PT Time Calculation (min) 42 min    Activity Tolerance Patient tolerated treatment well;No increased pain;Patient limited by pain    Behavior During Therapy [facility] for tasks assessed/performed            Past Medical History:  Diagnosis Date   Aortic stenosis 04/02/2021   Chronic diastolic heart failure (HCC)    CKD (chronic kidney disease) stage 2, GFR 60-89 ml/min 03/17/2012   Coronary artery disease, non-occlusive 03/18/2012   60-70% stenosis in RCA -- FFR 0.84; EF 60-65%   Diabetes mellitus without complication (HCC)    GERD (gastroesophageal reflux disease)    On PPI   History of GI bleed 02/2013   No obvious findings on EGD/colonoscopy   Hydrocele, bilateral    large on US  2020   Hyperlipidemia    Hypertension    Obesity (BMI 30.0-34.9) 10/02/2012   OSA (obstructive sleep apnea) 03/18/2012   Doing better with CPAP   Pulmonary hypertension (HCC) 12/10/2010   ECHO:  Mild PH,mild LVH; PA pressures estimated 30-40 mmHg   Pulmonary hypertension, unspecified (HCC) 06/04/2021   RBBB, 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   Event monitor; usually during sleeping hours; therefore not on beta blocker   Past Surgical History:  Procedure Laterality Date   APPENDECTOMY  1974   CARPAL TUNNEL RELEASE     EYE SURGERY  1960   Left eye   HEMORRHOID SURGERY     LEFT HEART CATH AND CORONARY ANGIOGRAPHY  03/27/2010   Moderate mid RCA lesion,right radial approach,normal EF   LEFT HEART CATHETERIZATION WITH CORONARY ANGIOGRAM N/A 03/17/2012   Procedure: LEFT HEART  CATHETERIZATION WITH CORONARY ANGIOGRAM;  Surgeon: Alm LELON Clay, MD;  Location: [facility] CATH LAB;  Service: Cardiovascular: Fractional Flow Reserve Measurement of mid RCA 60-70% stenosis = 0.84. Otherwise mild LCA CAD.  Normal EF & EDP   SHOULDER ARTHROSCOPY  2006   TRANSTHORACIC ECHOCARDIOGRAM  12/2010    Mild concentric LVH.  EF> 55%.  GR 1 DD.  Mild aortic sclerosis no stenosis.  PA pressures estimated 30-40 mmHg   Patient Active Problem List   Diagnosis Date Noted   Lipoma of torso 05/10/2023   Screen for colon cancer 08/31/2022   Ganglion cyst of wrist, right 02/16/2022   Pain in right hand 02/03/2022   Atypical chest pain 10/05/2021   GERD (gastroesophageal reflux disease)    Chronic diastolic heart failure (HCC)    Stage 3b chronic kidney disease (CKD) (HCC)    Pulmonary hypertension, unspecified (HCC) 06/04/2021   Aortic stenosis 04/02/2021   History of anemia 01/27/2021   Class 1 obesity with serious comorbidity and body mass index (BMI) of 30.0 to 30.9 in adult 02/13/2019   Heartburn 03/24/2016   OSA on CPAP 08/02/2015   Controlled diabetes mellitus with circulatory complication, without long-term current use of insulin  (HCC) 04/29/2014   Acute blood loss anemia 02/13/2013   Hypotestosteronism 01/02/2013   Type 2 diabetes mellitus without complication 01/01/2013   Dyslipidemia, goal LDL  below 70 10/15/2012   Obesity (BMI 30.0-34.9) 10/02/2012   Iron deficiency anemia 09/29/2012   Kidney disease 09/29/2012   Microscopic hematuria 04/04/2012   AV block, 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 Duanne, MD  REFERRING PROVIDER: Victory Gunnels, MD  REFERRING DIAG: [phone] (ICD-10-CM) - Spinal stenosis, lumbar region with neurogenic claudication  Rationale for Evaluation and Treatment: Rehabilitation  THERAPY DIAG:  Abnormal posture  Difficulty in walking, not elsewhere classified  Muscle weakness (generalized)  Other low back pain  ONSET DATE: 3 to 4 months ago  SUBJECTIVE:                                                                                                                                                                                           SUBJECTIVE STATEMENT: Patient reports that he wasn't able to see the MD following last session, but is overall feeling better.  PERTINENT HISTORY:  Significant cardiac history including chronic heart failure, stage II chronic kidney disease, diabetes, previous shoulder arthroscopy, former smoker for 50 years  PAIN:  Are you having pain? Yes: NPRS scale: 2-3/10 Pain location: Low back and mostly anterior thighs Pain description: Sore, like I'm being punched in my back Aggravating factors: Standing and walking, particularly when bent forward Relieving factors: Grabbing a cart when walking helps, change of position  PRECAUTIONS: Back  RED FLAGS: Be aware of significant cardiac history   WEIGHT BEARING RESTRICTIONS: No  FALLS:  Has patient fallen in last 6 months? No  LIVING ENVIRONMENT: Lives with: lives with their family and lives with their spouse Lives in: House/apartment Stairs: Difficulty ascending Has following equipment at home: None  OCCUPATION: Retired  PLOF: Independent  PATIENT GOALS: Return to normal house chores (dishes) with out pain  NEXT MD VISIT: 12/25/2023  OBJECTIVE:  Note: Objective measures were completed at Evaluation unless otherwise noted.  DIAGNOSTIC FINDINGS:  1. Bulky spondylitic spurring with  multilevel ankylosis. 2. Focal foraminal impingement at T8-9 on the left, and open level by chest CT in 2024, with degeneration. The spinal canal is widely patent.   M48.062 (ICD-10-CM) - Spinal stenosis, lumbar region with neurogenic claudication  PATIENT SURVEYS:  PSFS: THE PATIENT SPECIFIC FUNCTIONAL SCALE  Place score of 0-10 (0 = unable to perform activity and 10 = able to perform activity at the same level as before injury or problem)  Activity Date: 12/01/2023     Standing 2    2.  Walking 2    3.     4.      Total Score 2      Total Score = Sum of activity scores/number of activities  Minimally Detectable Change: 3 points (for single activity); 2 points (for average score)  [facility] (nd). The Patient Specific Functional Scale . Retrieved from [email or web address]   COGNITION: Overall cognitive status: Within functional limits for tasks assessed     SENSATION: Only thigh pain with prolonged standing  MUSCLE LENGTH: Hamstrings: Right 30 deg; Left 30 deg  POSTURE: rounded shoulders, forward head, decreased lumbar lordosis, and flexed trunk   LUMBAR ROM:   AROM 12/01/2023  Flexion   Extension 5  Right lateral flexion   Left lateral flexion 20  Right rotation 20  Left rotation    (Blank rows = not tested)  LOWER EXTREMITY ROM:     Passive  Left/Right 12/01/2023   Hip flexion 85/80   Hip extension    Hip abduction    Hip adduction    Hip internal rotation 8/4   Hip external rotation 18/26   Knee flexion    Knee extension    Ankle dorsiflexion    Ankle plantarflexion    Ankle inversion    Ankle eversion     (Blank rows = not tested)  STRENGTH:  Deferred at evaluation  MMT Right / Left 12/13/23   Hip flexion 40.1 / 39.5   Hip extension    Hip abduction    Hip adduction    Hip internal rotation    Hip external rotation    Knee flexion    Knee extension 59.8 / 62.2   Ankle  dorsiflexion    Ankle plantarflexion    Ankle inversion    Ankle eversion     (Blank rows = not tested)  GAIT: Distance walked: 50 feet Assistive device utilized: None Level of assistance: Complete Independence Comments: Jadrian ambulates in a flexed posture and notes he has increases in back pain almost immediately when standing and walking  TREATMENT DATE:   01/02/2024 TherEx:  Nustep level 4 with bilat UE and LE for 8 minutes ; patient endorsing no increase in pain, but slight discomfort in low back  Lower trunk rotations 1x8 each side with 5-8s holds  Single knee to chest 2x30s each side  Supine hamstring stretch 2x30s each side  Sidelying clamshells 1x12 each side with 2-3s holds  Supine bridge with yellow ball between knees 1x12   Manual:  IASTM with percussive device to bilat QL, bilat lumbar musculature, and bilat mid-lower thoracic musculature  Grade 2 mods to lower thoracic and lumbar spine    12/29/2023 Manual:  IASTM with percussive device to bilat QL, bilat lumbar musculature, and bilat thoracic musculature  Grade 2 mods to lumbar spine   TherEx:  Lower trunk rotations 1x8 with 10-15s holds  PT discussed importance of mobility, following up with MD, POC options    12/22/2023 TherEx:  UBE with bilat UE and LE level 2.5 for 4 min fwd/4 min back  Slant board gastroc stretch 3x30s  Supine single knee to chest 2x30s each side Seated figure 4 2x30s each side   TherAct:  Bilat leg press 3x12 with 81#  Unilat leg  press 2x12 with 43#   Neuro Re-Ed:  Paloff press for core activation and antirotation 2x12 facing each direction  Supine SLR with TA activation focus 2x10 each leg with 3# ankle weights  Supine reverse curls with yellow ball between knees 1x10 ; discontinued secondary to causing pain in low back/hips Supine bridge with yellow ball between knees to activate core and pelvic floor 2x10   12/20/2023 TherEx:  Nustep level 5 for 8 minutes  Supine single  knee to chest 2x30s each side  Supine hamstring stretch with strap 2x30s each side  Added to HEP to replace seated hamstring stretch to decrease pain/soreness  Supine hip flexor stretch with leg off the side 2x30s each side  Sidelying clamshells 2x12 each side with red TB around knees  Supine bridge with red TB around knees 2x12 with 2-3s hold  Supine figure 4 2x30s each side  Touchdown squats 2x10 with 5# DB                                                                  PATIENT EDUCATION:  Education details: See above Person educated: Patient Education method: Explanation, Demonstration, Tactile cues, Verbal cues, and Handouts Education comprehension: verbalized understanding, returned demonstration, verbal cues required, tactile cues required, and needs further education  HOME EXERCISE PROGRAM:  Access Code: [email or web address] URL: [email or web address] Date: 12/20/2023 Prepared by: Susannah Daring  Exercises - Standing Lumbar Extension at Wall - Forearms  - 5 x daily - 7 x weekly - 1 sets - 5 reps - 3 seconds hold - Single Knee to Chest Stretch  - 2-3 x daily - 7 x weekly - 1 sets - 5 reps - 20 seconds hold - Seated Hamstring Stretch  - 2-3 x daily - 7 x weekly - 3-5 reps - 30 seconds hold - Lateral Shift Correction at Wall  - 2 x daily - 7 x weekly - 3 reps - 20 seconds hold - Standing Hip Flexor Stretch  - 1 x daily - 7 x weekly - 3 sets - 3 reps - 20 seconds hold - Supine Hamstring Stretch with Strap  - 1 x daily - 7 x weekly - 3 sets - 30s hold                                     ASSESSMENT:  CLINICAL IMPRESSION: Patient arrived to session noting improvement in overall symptoms with no radiating pain this date. Patient tolerated all activities this date. Patient will continue to benefit from skilled PT.    OBJECTIVE IMPAIRMENTS: Abnormal gait, decreased activity tolerance, decreased endurance, decreased knowledge of condition, difficulty walking, decreased ROM,  decreased strength, decreased safety awareness, impaired perceived functional ability, impaired flexibility, improper body mechanics, postural dysfunction, and pain.   ACTIVITY LIMITATIONS: carrying, lifting, bending, standing, stairs, bed mobility, and locomotion level  PARTICIPATION LIMITATIONS: community activity  PERSONAL FACTORS: Significant cardiac history including chronic heart failure, stage II chronic kidney disease, diabetes, previous shoulder arthroscopy, former smoker for 50 years are also affecting patient's functional outcome.   REHAB POTENTIAL: Good  CLINICAL DECISION MAKING: Stable/uncomplicated  EVALUATION COMPLEXITY: Low   GOALS: Goals reviewed with patient? Yes  SHORT TERM GOALS:  Target date: 12/29/2023  Arda will be independent with his day 1 home exercise program Baseline: Started 12/01/2023 Goal status: ongoing 12/13/23  2.  Improve pain-free lumbar extension AROM without an increase in radicular symptoms to 10 degrees Baseline: 5 degrees Goal status: INITIAL  3.  Improve bilateral lower extremity flexibility for hip flexion to 90 degrees; hamstrings to 40 degrees and hip ER to 25 degrees Baseline: 85/80; 30/30 and 18/26 respectively Goal status: INITIAL   LONG TERM GOALS: Target date: 01/26/2024  Improve patient-specific functional score to at least 5 Baseline: 2 Goal status: INITIAL  2.  Senay will report low back pain no greater than 3/10 on the numeric pain rating scale Baseline: 3-6/10 with symptoms in the mostly anterior thigh as well Goal status: INITIAL  3.  Improve low back strength as assessed by [place]wood ability to stand and walk for 10 or more minutes without an increase in symptoms Baseline: Symptoms almost immediately when standing and walking Goal status: INITIAL  4.  Keedan will be independent with his long-term maintenance home exercise program at discharge Baseline: Started 12/01/2023 Goal status: INITIAL   PLAN:  PT  FREQUENCY: 1-2x/week  PT DURATION: 8 weeks  PLANNED INTERVENTIONS: 97110-Therapeutic exercises, 97530- Therapeutic activity, 97535- Self Care, [id number]- Manual therapy, [id number]- Gait training, [phone]- Electrical stimulation (unattended), [id number]- Traction (mechanical), 20560 (1-2 muscles), 20561 (3+ muscles)- Dry Needling, Patient/Family education, Spinal mobilization, Cryotherapy, and Moist heat.  PLAN FOR NEXT SESSION:  progress note (prior to MD appointment), core strengthening, lumbar and LE stretching as tolerated, mobs as needed   Susannah Daring, PT, DPT 01/02/24 8:41 AM

## 2024-01-02 ENCOUNTER — Ambulatory Visit

## 2024-01-02 DIAGNOSIS — M5459 Other low back pain: Secondary | ICD-10-CM

## 2024-01-02 DIAGNOSIS — R293 Abnormal posture: Secondary | ICD-10-CM

## 2024-01-02 DIAGNOSIS — R262 Difficulty in walking, not elsewhere classified: Secondary | ICD-10-CM | POA: Diagnosis not present

## 2024-01-02 DIAGNOSIS — M6281 Muscle weakness (generalized): Secondary | ICD-10-CM

## 2024-01-04 ENCOUNTER — Encounter: Admitting: Rehabilitative and Restorative Service Providers"

## 2024-01-09 NOTE — Therapy (Incomplete)
 OUTPATIENT PHYSICAL THERAPY THORACOLUMBAR TREATMENT / PROGRESS NOTE   Patient Name: James Jones MRN: [id number] DOB:25-May-1945, 78 y.o., male Today's Date: 01/09/2024  Progress Note Reporting Period *** to 01/10/2024  See note below for Objective Data and Assessment of Progress/Goals.      END OF SESSION:      Past Medical History:  Diagnosis Date   Aortic stenosis 04/02/2021   Chronic diastolic heart failure (HCC)    CKD (chronic kidney disease) stage 2, GFR 60-89 ml/min 03/17/2012   Coronary artery disease, non-occlusive 03/18/2012   60-70% stenosis in RCA -- FFR 0.84; EF 60-65%   Diabetes mellitus without complication (HCC)    GERD (gastroesophageal reflux disease)    On PPI   History of GI bleed 02/2013   No obvious findings on EGD/colonoscopy   Hydrocele, bilateral    large on US  2020   Hyperlipidemia    Hypertension    Obesity (BMI 30.0-34.9) 10/02/2012   OSA (obstructive sleep apnea) 03/18/2012   Doing better with CPAP   Pulmonary hypertension (HCC) 12/10/2010   ECHO:  Mild PH,mild LVH; PA pressures estimated 30-40 mmHg   Pulmonary hypertension, unspecified (HCC) 06/04/2021   RBBB, 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   Event monitor; usually during sleeping hours; therefore not on beta blocker   Past Surgical History:  Procedure Laterality Date   APPENDECTOMY  1974   CARPAL TUNNEL RELEASE     EYE SURGERY  1960   Left eye   HEMORRHOID SURGERY     LEFT HEART CATH AND CORONARY ANGIOGRAPHY  03/27/2010   Moderate mid RCA lesion,right radial approach,normal EF   LEFT HEART CATHETERIZATION WITH CORONARY ANGIOGRAM N/A 03/17/2012   Procedure: LEFT HEART CATHETERIZATION WITH CORONARY ANGIOGRAM;  Surgeon: Alm LELON Clay, MD;  Location: [facility] CATH LAB;  Service: Cardiovascular: Fractional Flow Reserve Measurement of mid RCA 60-70% stenosis = 0.84. Otherwise mild LCA CAD.  Normal EF & EDP   SHOULDER ARTHROSCOPY  2006   TRANSTHORACIC ECHOCARDIOGRAM   12/2010    Mild concentric LVH.  EF> 55%.  GR 1 DD.  Mild aortic sclerosis no stenosis.  PA pressures estimated 30-40 mmHg   Patient Active Problem List   Diagnosis Date Noted   Lipoma of torso 05/10/2023   Screen for colon cancer 08/31/2022   Ganglion cyst of wrist, right 02/16/2022   Pain in right hand 02/03/2022   Atypical chest pain 10/05/2021   GERD (gastroesophageal reflux disease)    Chronic diastolic heart failure (HCC)    Stage 3b chronic kidney disease (CKD) (HCC)    Pulmonary hypertension, unspecified (HCC) 06/04/2021   Aortic stenosis 04/02/2021   History of anemia 01/27/2021   Class 1 obesity with serious comorbidity and body mass index (BMI) of 30.0 to 30.9 in adult 02/13/2019   Heartburn 03/24/2016   OSA on CPAP 08/02/2015   Controlled diabetes mellitus with circulatory complication, without long-term current use of insulin  (HCC) 04/29/2014   Acute blood loss anemia 02/13/2013   Hypotestosteronism 01/02/2013   Type 2 diabetes mellitus without complication 01/01/2013   Dyslipidemia, goal LDL below 70 10/15/2012   Obesity (BMI 30.0-34.9) 10/02/2012   Iron deficiency anemia 09/29/2012   Kidney disease 09/29/2012   Microscopic hematuria 04/04/2012   AV block, 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 Duanne, MD  REFERRING PROVIDER: Victory Gunnels, MD  REFERRING DIAG: [phone] (ICD-10-CM) - Spinal stenosis, lumbar region with neurogenic claudication  Rationale for Evaluation and Treatment: Rehabilitation  THERAPY  DIAG:  No diagnosis found.  ONSET DATE: 3 to 4 months ago  SUBJECTIVE:                                                                                                                                                                                           SUBJECTIVE STATEMENT: *** Patient reports that he wasn't able to see the MD following last session, but is overall feeling better.  PERTINENT HISTORY:  Significant cardiac history including chronic heart failure, stage II chronic kidney disease, diabetes, previous shoulder arthroscopy, former smoker for 50 years  PAIN:  Are you having pain? Yes: NPRS scale: 2-3/10 Pain location: Low back and mostly anterior thighs Pain description: Sore, like I'm being punched in my back Aggravating factors: Standing and walking, particularly when bent forward Relieving factors: Grabbing a cart when walking helps, change of position  PRECAUTIONS: Back  RED FLAGS: Be aware of significant cardiac history   WEIGHT BEARING RESTRICTIONS: No  FALLS:  Has patient fallen in last 6 months? No  LIVING ENVIRONMENT: Lives with: lives with their family and lives with their spouse Lives in: House/apartment Stairs: Difficulty ascending Has following equipment at home: None  OCCUPATION: Retired  PLOF: Independent  PATIENT GOALS: Return to normal house chores (dishes) with out pain  NEXT MD VISIT: 12/25/2023  OBJECTIVE:  Note: Objective measures were completed at Evaluation unless otherwise noted.  DIAGNOSTIC FINDINGS:  1. Bulky spondylitic spurring with multilevel ankylosis. 2. Focal foraminal impingement at T8-9 on the left, and open level by chest CT in 2024, with degeneration. The spinal canal is widely patent.   M48.062 (ICD-10-CM) - Spinal stenosis, lumbar region with neurogenic claudication  PATIENT SURVEYS:  PSFS: THE PATIENT SPECIFIC FUNCTIONAL SCALE  Place score of 0-10 (0 = unable to perform activity and 10 = able to perform  activity at the same level as before injury or problem)  Activity Date: 12/01/2023  01/10/2024 ***   Standing 2    2.  Walking 2    3.     4.      Total Score 2      Total Score = Sum of activity scores/number of activities  Minimally Detectable Change: 3 points (for single activity); 2 points (  for average score)  [facility] (nd). The Patient Specific Functional Scale . Retrieved from [email or web address]   COGNITION: Overall cognitive status: Within functional limits for tasks assessed     SENSATION: Only thigh pain with prolonged standing  MUSCLE LENGTH: Hamstrings: Right 30 deg; Left 30 deg  POSTURE: rounded shoulders, forward head, decreased lumbar lordosis, and flexed trunk   LUMBAR ROM:   AROM 12/01/2023  Flexion   Extension 5  Right lateral flexion   Left lateral flexion 20  Right rotation 20  Left rotation    (Blank rows = not tested)  LOWER EXTREMITY ROM:     Passive  Left/Right 12/01/2023   Hip flexion 85/80   Hip extension    Hip abduction    Hip adduction    Hip internal rotation 8/4   Hip external rotation 18/26   Knee flexion    Knee extension    Ankle dorsiflexion    Ankle plantarflexion    Ankle inversion    Ankle eversion     (Blank rows = not tested)  STRENGTH:  Deferred at evaluation  MMT Right / Left 12/13/23   Hip flexion 40.1 / 39.5   Hip extension    Hip abduction    Hip adduction    Hip internal rotation    Hip external rotation    Knee flexion    Knee extension 59.8 / 62.2   Ankle dorsiflexion    Ankle plantarflexion    Ankle inversion    Ankle eversion     (Blank rows = not tested)  GAIT: Distance walked: 50 feet Assistive device utilized: None Level of assistance: Complete Independence Comments: James Jones ambulates in a flexed posture and notes he has increases in back pain almost immediately when standing and walking  TREATMENT DATE:    01/10/2024 ***   01/02/2024 TherEx:  Nustep level 4 with bilat UE and LE for 8 minutes ; patient endorsing no increase in pain, but slight discomfort in low back  Lower trunk rotations 1x8 each side with 5-8s holds  Single knee to chest 2x30s each side  Supine hamstring stretch 2x30s each side  Sidelying clamshells 1x12 each side with 2-3s holds  Supine bridge with yellow ball between knees 1x12   Manual:  IASTM with percussive device to bilat QL, bilat lumbar musculature, and bilat mid-lower thoracic musculature  Grade 2 mods to lower thoracic and lumbar spine    12/29/2023 Manual:  IASTM with percussive device to bilat QL, bilat lumbar musculature, and bilat thoracic musculature  Grade 2 mods to lumbar spine   TherEx:  Lower trunk rotations 1x8 with 10-15s holds  PT discussed importance of mobility, following up with MD, POC options    12/22/2023 TherEx:  UBE with bilat UE and LE level 2.5 for 4 min fwd/4 min back  Slant board gastroc stretch 3x30s  Supine single knee to chest 2x30s each side Seated figure 4 2x30s each side   TherAct:  Bilat leg press 3x12 with 81#  Unilat leg press 2x12 with 43#   Neuro Re-Ed:  Paloff press for core activation and antirotation 2x12 facing each direction  Supine SLR with TA activation focus 2x10 each leg with 3# ankle weights  Supine reverse curls with yellow ball between knees 1x10 ; discontinued secondary to causing pain in low back/hips Supine bridge with yellow ball between knees to activate core and pelvic floor 2x10   12/20/2023 TherEx:  Nustep level 5 for 8 minutes  Supine single  knee to chest 2x30s each side  Supine hamstring stretch with strap 2x30s each side  Added to HEP to replace seated hamstring stretch to decrease pain/soreness  Supine hip flexor stretch with leg off the side 2x30s each side  Sidelying clamshells 2x12 each side with red TB around knees  Supine bridge with red TB around knees 2x12 with 2-3s hold   Supine figure 4 2x30s each side  Touchdown squats 2x10 with 5# DB                                                                  PATIENT EDUCATION:  Education details: See above Person educated: Patient Education method: Explanation, Demonstration, Tactile cues, Verbal cues, and Handouts Education comprehension: verbalized understanding, returned demonstration, verbal cues required, tactile cues required, and needs further education  HOME EXERCISE PROGRAM:  Access Code: [email or web address] URL: [email or web address] Date: 12/20/2023 Prepared by: Susannah Daring  Exercises - Standing Lumbar Extension at Wall - Forearms  - 5 x daily - 7 x weekly - 1 sets - 5 reps - 3 seconds hold - Single Knee to Chest Stretch  - 2-3 x daily - 7 x weekly - 1 sets - 5 reps - 20 seconds hold - Seated Hamstring Stretch  - 2-3 x daily - 7 x weekly - 3-5 reps - 30 seconds hold - Lateral Shift Correction at Wall  - 2 x daily - 7 x weekly - 3 reps - 20 seconds hold - Standing Hip Flexor Stretch  - 1 x daily - 7 x weekly - 3 sets - 3 reps - 20 seconds hold - Supine Hamstring Stretch with Strap  - 1 x daily - 7 x weekly - 3 sets - 30s hold                                     ASSESSMENT:  CLINICAL IMPRESSION: *** Patient arrived to session noting improvement in overall symptoms with no radiating pain this date. Patient tolerated all activities this date. Patient will continue to benefit from skilled PT.    OBJECTIVE IMPAIRMENTS: Abnormal gait, decreased activity tolerance, decreased endurance, decreased knowledge of condition, difficulty walking, decreased ROM, decreased strength, decreased safety awareness, impaired perceived functional ability, impaired flexibility, improper body mechanics, postural dysfunction, and pain.   ACTIVITY LIMITATIONS: carrying, lifting, bending, standing, stairs, bed mobility, and locomotion level  PARTICIPATION LIMITATIONS: community activity  PERSONAL FACTORS:  Significant cardiac history including chronic heart failure, stage II chronic kidney disease, diabetes, previous shoulder arthroscopy, former smoker for 50 years are also affecting patient's functional outcome.   REHAB POTENTIAL: Good  CLINICAL DECISION MAKING: Stable/uncomplicated  EVALUATION COMPLEXITY: Low   GOALS: Goals reviewed with patient? Yes  SHORT TERM GOALS: Target date: 12/29/2023  James Jones will be independent with his day 1 home exercise program Baseline: Started 12/01/2023 Goal status: ongoing 12/13/23  2.  Improve pain-free lumbar extension AROM without an increase in radicular symptoms to 10 degrees Baseline: 5 degrees Goal status: INITIAL  3.  Improve bilateral lower extremity flexibility for hip flexion to 90 degrees; hamstrings to 40 degrees and hip ER to 25 degrees Baseline: 85/80; 30/30 and 18/26 respectively Goal status: INITIAL  LONG TERM GOALS: Target date: 01/26/2024  Improve patient-specific functional score to at least 5 Baseline: 2 Goal status: INITIAL  2.  James Jones will report low back pain no greater than 3/10 on the numeric pain rating scale Baseline: 3-6/10 with symptoms in the mostly anterior thigh as well Goal status: INITIAL  3.  Improve low back strength as assessed by James Jones ability to stand and walk for 10 or more minutes without an increase in symptoms Baseline: Symptoms almost immediately when standing and walking Goal status: INITIAL  4.  James Jones will be independent with his long-term maintenance home exercise program at discharge Baseline: Started 12/01/2023 Goal status: INITIAL   PLAN:  PT FREQUENCY: 1-2x/week  PT DURATION: 8 weeks  PLANNED INTERVENTIONS: 97110-Therapeutic exercises, 97530- Therapeutic activity, 97535- Self Care, [id number]- Manual therapy, [id number]- Gait training, [id number]- Electrical stimulation (unattended), [id number]- Traction (mechanical), 20560 (1-2 muscles), [id number] (3+ muscles)- Dry Needling, Patient/Family education,  Spinal mobilization, Cryotherapy, and Moist heat.  PLAN FOR NEXT SESSION:  *** progress note (prior to MD appointment), core strengthening, lumbar and LE stretching as tolerated, mobs as needed   Susannah Daring, PT, DPT 01/09/24 8:07 AM

## 2024-01-10 ENCOUNTER — Encounter

## 2024-01-16 NOTE — Therapy (Incomplete)
 OUTPATIENT PHYSICAL THERAPY THORACOLUMBAR TREATMENT / PROGRESS NOTE   Patient Name: James Jones MRN: [id number] DOB:10/30/1945, 78 y.o., male Today's Date: 01/16/2024  Progress Note Reporting Period *** to 01/10/2024  See note below for Objective Data and Assessment of Progress/Goals.      END OF SESSION:      Past Medical History:  Diagnosis Date   Aortic stenosis 04/02/2021   Chronic diastolic heart failure (HCC)    CKD (chronic kidney disease) stage 2, GFR 60-89 ml/min 03/17/2012   Coronary artery disease, non-occlusive 03/18/2012   60-70% stenosis in RCA -- FFR 0.84; EF 60-65%   Diabetes mellitus without complication (HCC)    GERD (gastroesophageal reflux disease)    On PPI   History of GI bleed 02/2013   No obvious findings on EGD/colonoscopy   Hydrocele, bilateral    large on US  2020   Hyperlipidemia    Hypertension    Obesity (BMI 30.0-34.9) 10/02/2012   OSA (obstructive sleep apnea) 03/18/2012   Doing better with CPAP   Pulmonary hypertension (HCC) 12/10/2010   ECHO:  Mild PH,mild LVH; PA pressures estimated 30-40 mmHg   Pulmonary hypertension, unspecified (HCC) 06/04/2021   RBBB, 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   Event monitor; usually during sleeping hours; therefore not on beta blocker   Past Surgical History:  Procedure Laterality Date   APPENDECTOMY  1974   CARPAL TUNNEL RELEASE     EYE SURGERY  1960   Left eye   HEMORRHOID SURGERY     LEFT HEART CATH AND CORONARY ANGIOGRAPHY  03/27/2010   Moderate mid RCA lesion,right radial approach,normal EF   LEFT HEART CATHETERIZATION WITH CORONARY ANGIOGRAM N/A 03/17/2012   Procedure: LEFT HEART CATHETERIZATION WITH CORONARY ANGIOGRAM;  Surgeon: Alm LELON Clay, MD;  Location: [facility] CATH LAB;  Service: Cardiovascular: Fractional Flow Reserve Measurement of mid RCA 60-70% stenosis = 0.84. Otherwise mild LCA CAD.  Normal EF & EDP   SHOULDER ARTHROSCOPY  2006   TRANSTHORACIC ECHOCARDIOGRAM   12/2010    Mild concentric LVH.  EF> 55%.  GR 1 DD.  Mild aortic sclerosis no stenosis.  PA pressures estimated 30-40 mmHg   Patient Active Problem List   Diagnosis Date Noted   Lipoma of torso 05/10/2023   Screen for colon cancer 08/31/2022   Ganglion cyst of wrist, right 02/16/2022   Pain in right hand 02/03/2022   Atypical chest pain 10/05/2021   GERD (gastroesophageal reflux disease)    Chronic diastolic heart failure (HCC)    Stage 3b chronic kidney disease (CKD) (HCC)    Pulmonary hypertension, unspecified (HCC) 06/04/2021   Aortic stenosis 04/02/2021   History of anemia 01/27/2021   Class 1 obesity with serious comorbidity and body mass index (BMI) of 30.0 to 30.9 in adult 02/13/2019   Heartburn 03/24/2016   OSA on CPAP 08/02/2015   Controlled diabetes mellitus with circulatory complication, without long-term current use of insulin  (HCC) 04/29/2014   Acute blood loss anemia 02/13/2013   Hypotestosteronism 01/02/2013   Type 2 diabetes mellitus without complication 01/01/2013   Dyslipidemia, goal LDL below 70 10/15/2012   Obesity (BMI 30.0-34.9) 10/02/2012   Iron deficiency anemia 09/29/2012   Kidney disease 09/29/2012   Microscopic hematuria 04/04/2012   AV block, 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 Duanne, MD  REFERRING PROVIDER: Victory Gunnels, MD  REFERRING DIAG: [phone] (ICD-10-CM) - Spinal stenosis, lumbar region with neurogenic claudication  Rationale for Evaluation and Treatment: Rehabilitation  THERAPY  DIAG:  No diagnosis found.  ONSET DATE: 3 to 4 months ago  SUBJECTIVE:                                                                                                                                                                                           SUBJECTIVE STATEMENT: *** Patient reports that he wasn't able to see the MD following last session, but is overall feeling better.  PERTINENT HISTORY:  Significant cardiac history including chronic heart failure, stage II chronic kidney disease, diabetes, previous shoulder arthroscopy, former smoker for 50 years  PAIN:  Are you having pain? Yes: NPRS scale: 2-3/10 Pain location: Low back and mostly anterior thighs Pain description: Sore, like I'm being punched in my back Aggravating factors: Standing and walking, particularly when bent forward Relieving factors: Grabbing a cart when walking helps, change of position  PRECAUTIONS: Back  RED FLAGS: Be aware of significant cardiac history   WEIGHT BEARING RESTRICTIONS: No  FALLS:  Has patient fallen in last 6 months? No  LIVING ENVIRONMENT: Lives with: lives with their family and lives with their spouse Lives in: House/apartment Stairs: Difficulty ascending Has following equipment at home: None  OCCUPATION: Retired  PLOF: Independent  PATIENT GOALS: Return to normal house chores (dishes) with out pain  NEXT MD VISIT: 12/25/2023  OBJECTIVE:  Note: Objective measures were completed at Evaluation unless otherwise noted.  DIAGNOSTIC FINDINGS:  1. Bulky spondylitic spurring with multilevel ankylosis. 2. Focal foraminal impingement at T8-9 on the left, and open level by chest CT in 2024, with degeneration. The spinal canal is widely patent.   M48.062 (ICD-10-CM) - Spinal stenosis, lumbar region with neurogenic claudication  PATIENT SURVEYS:  PSFS: THE PATIENT SPECIFIC FUNCTIONAL SCALE  Place score of 0-10 (0 = unable to perform activity and 10 = able to perform  activity at the same level as before injury or problem)  Activity Date: 12/01/2023  01/10/2024 ***   Standing 2    2.  Walking 2    3.     4.      Total Score 2      Total Score = Sum of activity scores/number of activities  Minimally Detectable Change: 3 points (for single activity); 2 points (  for average score)  [facility] (nd). The Patient Specific Functional Scale . Retrieved from [email or web address]   COGNITION: Overall cognitive status: Within functional limits for tasks assessed     SENSATION: Only thigh pain with prolonged standing  MUSCLE LENGTH: Hamstrings: Right 30 deg; Left 30 deg  POSTURE: rounded shoulders, forward head, decreased lumbar lordosis, and flexed trunk   LUMBAR ROM:   AROM 12/01/2023  Flexion   Extension 5  Right lateral flexion   Left lateral flexion 20  Right rotation 20  Left rotation    (Blank rows = not tested)  LOWER EXTREMITY ROM:     Passive  Left/Right 12/01/2023   Hip flexion 85/80   Hip extension    Hip abduction    Hip adduction    Hip internal rotation 8/4   Hip external rotation 18/26   Knee flexion    Knee extension    Ankle dorsiflexion    Ankle plantarflexion    Ankle inversion    Ankle eversion     (Blank rows = not tested)  STRENGTH:  Deferred at evaluation  MMT Right / Left 12/13/23   Hip flexion 40.1 / 39.5   Hip extension    Hip abduction    Hip adduction    Hip internal rotation    Hip external rotation    Knee flexion    Knee extension 59.8 / 62.2   Ankle dorsiflexion    Ankle plantarflexion    Ankle inversion    Ankle eversion     (Blank rows = not tested)  GAIT: Distance walked: 50 feet Assistive device utilized: None Level of assistance: Complete Independence Comments: Shneur ambulates in a flexed posture and notes he has increases in back pain almost immediately when standing and walking  TREATMENT DATE:    01/10/2024 ***   01/02/2024 TherEx:  Nustep level 4 with bilat UE and LE for 8 minutes ; patient endorsing no increase in pain, but slight discomfort in low back  Lower trunk rotations 1x8 each side with 5-8s holds  Single knee to chest 2x30s each side  Supine hamstring stretch 2x30s each side  Sidelying clamshells 1x12 each side with 2-3s holds  Supine bridge with yellow ball between knees 1x12   Manual:  IASTM with percussive device to bilat QL, bilat lumbar musculature, and bilat mid-lower thoracic musculature  Grade 2 mods to lower thoracic and lumbar spine    12/29/2023 Manual:  IASTM with percussive device to bilat QL, bilat lumbar musculature, and bilat thoracic musculature  Grade 2 mods to lumbar spine   TherEx:  Lower trunk rotations 1x8 with 10-15s holds  PT discussed importance of mobility, following up with MD, POC options    12/22/2023 TherEx:  UBE with bilat UE and LE level 2.5 for 4 min fwd/4 min back  Slant board gastroc stretch 3x30s  Supine single knee to chest 2x30s each side Seated figure 4 2x30s each side   TherAct:  Bilat leg press 3x12 with 81#  Unilat leg press 2x12 with 43#   Neuro Re-Ed:  Paloff press for core activation and antirotation 2x12 facing each direction  Supine SLR with TA activation focus 2x10 each leg with 3# ankle weights  Supine reverse curls with yellow ball between knees 1x10 ; discontinued secondary to causing pain in low back/hips Supine bridge with yellow ball between knees to activate core and pelvic floor 2x10   12/20/2023 TherEx:  Nustep level 5 for 8 minutes  Supine single  knee to chest 2x30s each side  Supine hamstring stretch with strap 2x30s each side  Added to HEP to replace seated hamstring stretch to decrease pain/soreness  Supine hip flexor stretch with leg off the side 2x30s each side  Sidelying clamshells 2x12 each side with red TB around knees  Supine bridge with red TB around knees 2x12 with 2-3s hold   Supine figure 4 2x30s each side  Touchdown squats 2x10 with 5# DB                                                                  PATIENT EDUCATION:  Education details: See above Person educated: Patient Education method: Explanation, Demonstration, Tactile cues, Verbal cues, and Handouts Education comprehension: verbalized understanding, returned demonstration, verbal cues required, tactile cues required, and needs further education  HOME EXERCISE PROGRAM:  Access Code: [email or web address] URL: [email or web address] Date: 12/20/2023 Prepared by: Susannah Daring  Exercises - Standing Lumbar Extension at Wall - Forearms  - 5 x daily - 7 x weekly - 1 sets - 5 reps - 3 seconds hold - Single Knee to Chest Stretch  - 2-3 x daily - 7 x weekly - 1 sets - 5 reps - 20 seconds hold - Seated Hamstring Stretch  - 2-3 x daily - 7 x weekly - 3-5 reps - 30 seconds hold - Lateral Shift Correction at Wall  - 2 x daily - 7 x weekly - 3 reps - 20 seconds hold - Standing Hip Flexor Stretch  - 1 x daily - 7 x weekly - 3 sets - 3 reps - 20 seconds hold - Supine Hamstring Stretch with Strap  - 1 x daily - 7 x weekly - 3 sets - 30s hold                                     ASSESSMENT:  CLINICAL IMPRESSION: *** Patient arrived to session noting improvement in overall symptoms with no radiating pain this date. Patient tolerated all activities this date. Patient will continue to benefit from skilled PT.    OBJECTIVE IMPAIRMENTS: Abnormal gait, decreased activity tolerance, decreased endurance, decreased knowledge of condition, difficulty walking, decreased ROM, decreased strength, decreased safety awareness, impaired perceived functional ability, impaired flexibility, improper body mechanics, postural dysfunction, and pain.   ACTIVITY LIMITATIONS: carrying, lifting, bending, standing, stairs, bed mobility, and locomotion level  PARTICIPATION LIMITATIONS: community activity  PERSONAL FACTORS:  Significant cardiac history including chronic heart failure, stage II chronic kidney disease, diabetes, previous shoulder arthroscopy, former smoker for 50 years are also affecting patient's functional outcome.   REHAB POTENTIAL: Good  CLINICAL DECISION MAKING: Stable/uncomplicated  EVALUATION COMPLEXITY: Low   GOALS: Goals reviewed with patient? Yes  SHORT TERM GOALS: Target date: 12/29/2023  Gailen will be independent with his day 1 home exercise program Baseline: Started 12/01/2023 Goal status: ongoing 12/13/23  2.  Improve pain-free lumbar extension AROM without an increase in radicular symptoms to 10 degrees Baseline: 5 degrees Goal status: INITIAL  3.  Improve bilateral lower extremity flexibility for hip flexion to 90 degrees; hamstrings to 40 degrees and hip ER to 25 degrees Baseline: 85/80; 30/30 and 18/26 respectively Goal status: INITIAL  LONG TERM GOALS: Target date: 01/26/2024  Improve patient-specific functional score to at least 5 Baseline: 2 Goal status: INITIAL  2.  Deryl will report low back pain no greater than 3/10 on the numeric pain rating scale Baseline: 3-6/10 with symptoms in the mostly anterior thigh as well Goal status: INITIAL  3.  Improve low back strength as assessed by Lynwood ability to stand and walk for 10 or more minutes without an increase in symptoms Baseline: Symptoms almost immediately when standing and walking Goal status: INITIAL  4.  Ari will be independent with his long-term maintenance home exercise program at discharge Baseline: Started 12/01/2023 Goal status: INITIAL   PLAN:  PT FREQUENCY: 1-2x/week  PT DURATION: 8 weeks  PLANNED INTERVENTIONS: 97110-Therapeutic exercises, 97530- Therapeutic activity, 97535- Self Care, [id number]- Manual therapy, [id number]- Gait training, [phone]- Electrical stimulation (unattended), [id number]- Traction (mechanical), 20560 (1-2 muscles), [id number] (3+ muscles)- Dry Needling, Patient/Family education,  Spinal mobilization, Cryotherapy, and Moist heat.  PLAN FOR NEXT SESSION:  *** progress note (prior to MD appointment), core strengthening, lumbar and LE stretching as tolerated, mobs as needed   Susannah Daring, PT, DPT 01/16/24 8:10 AM

## 2024-01-17 ENCOUNTER — Encounter

## 2024-01-18 NOTE — Therapy (Addendum)
 "  OUTPATIENT PHYSICAL THERAPY THORACOLUMBAR TREATMENT / DISCHARGE    Patient Name: James Jones MRN: [id number] DOB:January 20, 1946, 78 y.o., male Today's Date: 01/19/2024     END OF SESSION:  PT End of Session - 01/19/24 0751     Visit Number 8    Number of Visits 16    Date for Recertification  01/26/24    Authorization Type Medicare    Progress Note Due on Visit 10    PT Start Time 0803    PT Stop Time 0842    PT Time Calculation (min) 39 min    Activity Tolerance Patient tolerated treatment well;No increased pain;Patient limited by pain    Behavior During Therapy [facility] for tasks assessed/performed             Past Medical History:  Diagnosis Date   Aortic stenosis 04/02/2021   Chronic diastolic heart failure (HCC)    CKD (chronic kidney disease) stage 2, GFR 60-89 ml/min 03/17/2012   Coronary artery disease, non-occlusive 03/18/2012   60-70% stenosis in RCA -- FFR 0.84; EF 60-65%   Diabetes mellitus without complication (HCC)    GERD (gastroesophageal reflux disease)    On PPI   History of GI bleed 02/2013   No obvious findings on EGD/colonoscopy   Hydrocele, bilateral    large on US  2020   Hyperlipidemia    Hypertension    Obesity (BMI 30.0-34.9) 10/02/2012   OSA (obstructive sleep apnea) 03/18/2012   Doing better with CPAP   Pulmonary hypertension (HCC) 12/10/2010   ECHO:  Mild PH,mild LVH; PA pressures estimated 30-40 mmHg   Pulmonary hypertension, unspecified (HCC) 06/04/2021   RBBB, 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   Event monitor; usually during sleeping hours; therefore not on beta blocker   Past Surgical History:  Procedure Laterality Date   APPENDECTOMY  1974   CARPAL TUNNEL RELEASE     EYE SURGERY  1960   Left eye   HEMORRHOID SURGERY     LEFT HEART CATH AND CORONARY ANGIOGRAPHY  03/27/2010   Moderate mid RCA lesion,right radial approach,normal EF   LEFT HEART CATHETERIZATION WITH CORONARY ANGIOGRAM N/A 03/17/2012   Procedure:  LEFT HEART CATHETERIZATION WITH CORONARY ANGIOGRAM;  Surgeon: Alm LELON Clay, MD;  Location: [facility] CATH LAB;  Service: Cardiovascular: Fractional Flow Reserve Measurement of mid RCA 60-70% stenosis = 0.84. Otherwise mild LCA CAD.  Normal EF & EDP   SHOULDER ARTHROSCOPY  2006   TRANSTHORACIC ECHOCARDIOGRAM  12/2010    Mild concentric LVH.  EF> 55%.  GR 1 DD.  Mild aortic sclerosis no stenosis.  PA pressures estimated 30-40 mmHg   Patient Active Problem List   Diagnosis Date Noted   Lipoma of torso 05/10/2023   Screen for colon cancer 08/31/2022   Ganglion cyst of wrist, right 02/16/2022   Pain in right hand 02/03/2022   Atypical chest pain 10/05/2021   GERD (gastroesophageal reflux disease)    Chronic diastolic heart failure (HCC)    Stage 3b chronic kidney disease (CKD) (HCC)    Pulmonary hypertension, unspecified (HCC) 06/04/2021   Aortic stenosis 04/02/2021   History of anemia 01/27/2021   Class 1 obesity with serious comorbidity and body mass index (BMI) of 30.0 to 30.9 in adult 02/13/2019   Heartburn 03/24/2016   OSA on CPAP 08/02/2015   Controlled diabetes mellitus with circulatory complication, without long-term current use of insulin  (HCC) 04/29/2014   Acute blood loss anemia 02/13/2013   Hypotestosteronism 01/02/2013   Type 2 diabetes  mellitus without complication 01/01/2013   Dyslipidemia, goal LDL below 70 10/15/2012   Obesity (BMI 30.0-34.9) 10/02/2012   Iron deficiency anemia 09/29/2012   Kidney disease 09/29/2012   Microscopic hematuria 04/04/2012   AV block, 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 Duanne, MD  REFERRING PROVIDER: Victory Gunnels, MD  REFERRING DIAG: [phone] (ICD-10-CM) - Spinal stenosis, lumbar region with neurogenic claudication  Rationale for Evaluation and Treatment: Rehabilitation  THERAPY DIAG:  Abnormal posture  Difficulty in walking, not elsewhere classified  Muscle weakness (generalized)  Other low back pain  ONSET DATE: 3 to 4 months ago  SUBJECTIVE:                                                                                                                                                                                           SUBJECTIVE STATEMENT: Patient reports that he is doing well and hasn't been having those large increases in pain.   PERTINENT HISTORY:  Significant cardiac history including chronic heart failure, stage II chronic kidney disease, diabetes, previous shoulder arthroscopy, former smoker for 50 years  PAIN:  Are you having pain? Yes: NPRS scale: 2/10 this morning, 5/10 on average  Pain location: Low back and mostly anterior thighs Pain description: Sore, like I'm being punched in my back Aggravating factors: Standing and walking, particularly when bent forward Relieving factors: Grabbing a cart when walking helps, change of position  PRECAUTIONS: Back  RED FLAGS: Be aware of significant cardiac history   WEIGHT BEARING RESTRICTIONS: No  FALLS:  Has patient fallen in last 6 months? No  LIVING ENVIRONMENT: Lives with: lives with their family and lives with their spouse Lives in: House/apartment Stairs: Difficulty ascending Has following equipment at home: None  OCCUPATION: Retired  PLOF: Independent  PATIENT GOALS: Return to normal house chores (dishes) with out pain  NEXT MD VISIT: 12/25/2023  OBJECTIVE:  Note: Objective measures were completed at Evaluation unless otherwise noted.  DIAGNOSTIC FINDINGS:  1. Bulky  spondylitic spurring with multilevel ankylosis. 2. Focal foraminal impingement at T8-9 on the left, and open level by chest CT in 2024, with degeneration. The spinal canal is widely patent.   F51.937 (ICD-10-CM) - Spinal stenosis, lumbar region  with neurogenic claudication  PATIENT SURVEYS:  PSFS: THE PATIENT SPECIFIC FUNCTIONAL SCALE  Place score of 0-10 (0 = unable to perform activity and 10 = able to perform activity at the same level as before injury or problem)  Activity Date: 12/01/2023  01/19/2024    Standing 2 5   2.  Walking 2 5   3.     4.      Total Score 2 5     Total Score = Sum of activity scores/number of activities  Minimally Detectable Change: 3 points (for single activity); 2 points (for average score)  [facility] (nd). The Patient Specific Functional Scale . Retrieved from [email or web address]   COGNITION: Overall cognitive status: Within functional limits for tasks assessed     SENSATION: Only thigh pain with prolonged standing  MUSCLE LENGTH: Hamstrings: Right 30 deg; Left 30 deg  POSTURE: rounded shoulders, forward head, decreased lumbar lordosis, and flexed trunk   LUMBAR ROM:   AROM 12/01/2023  Flexion   Extension 5  Right lateral flexion   Left lateral flexion 20  Right rotation 20  Left rotation    (Blank rows = not tested)  LOWER EXTREMITY ROM:     Passive  Left/Right 12/01/2023   Hip flexion 85/80   Hip extension    Hip abduction    Hip adduction    Hip internal rotation 8/4   Hip external rotation 18/26   Knee flexion    Knee extension    Ankle dorsiflexion    Ankle plantarflexion    Ankle inversion    Ankle eversion     (Blank rows = not tested)  STRENGTH:  Deferred at evaluation  MMT Right / Left 12/13/23   Hip flexion 40.1 / 39.5   Hip extension    Hip abduction    Hip adduction    Hip internal rotation    Hip external rotation    Knee flexion     Knee extension 59.8 / 62.2   Ankle dorsiflexion    Ankle plantarflexion    Ankle inversion    Ankle eversion     (Blank rows = not tested)  GAIT: Distance walked: 50 feet Assistive device utilized: None Level of assistance: Complete Independence Comments: Davied ambulates in a flexed posture and notes he has increases in back pain almost immediately when standing and walking  TREATMENT DATE:   01/19/2024 TherEx:  UBE with bilat UE and LE level 3 for 4 min fwd/4 min back  Lower trunk rotations 3x30s each side  Sidelying open books 1x12 each side  Sidelying clamshells with red TB 2x8 with 2s holds  Bridge with yellow ball between knees 2x12   Manual:  IASTM with percussive device to bilat QL, bilat lumbar musculature, and bilat mid-lower thoracic musculature  Grade 2 mods to lower thoracic and lumbar spine    01/02/2024 TherEx:  Nustep level 4 with bilat UE and LE for 8 minutes ; patient endorsing no increase in pain, but slight discomfort in low back  Lower trunk rotations 1x8 each side with 5-8s holds  Single knee to chest 2x30s each side  Supine hamstring stretch 2x30s each side  Sidelying clamshells 1x12 each side with 2-3s holds  Supine bridge with yellow ball between knees 1x12   Manual:  IASTM with percussive device to bilat QL, bilat lumbar musculature, and bilat mid-lower thoracic musculature  Grade 2 mods to lower thoracic and lumbar spine    12/29/2023 Manual:  IASTM with  percussive device to bilat QL, bilat lumbar musculature, and bilat thoracic musculature  Grade 2 mods to lumbar spine   TherEx:  Lower trunk rotations 1x8 with 10-15s holds  PT discussed importance of mobility, following up with MD, POC options    12/22/2023 TherEx:  UBE with bilat UE and LE level 2.5 for 4 min fwd/4 min back  Slant board gastroc stretch 3x30s  Supine single knee to chest 2x30s each side Seated figure 4 2x30s each side   TherAct:  Bilat leg press 3x12 with 81#   Unilat leg press 2x12 with 43#   Neuro Re-Ed:  Paloff press for core activation and antirotation 2x12 facing each direction  Supine SLR with TA activation focus 2x10 each leg with 3# ankle weights  Supine reverse curls with yellow ball between knees 1x10 ; discontinued secondary to causing pain in low back/hips Supine bridge with yellow ball between knees to activate core and pelvic floor 2x10   12/20/2023 TherEx:  Nustep level 5 for 8 minutes  Supine single knee to chest 2x30s each side  Supine hamstring stretch with strap 2x30s each side  Added to HEP to replace seated hamstring stretch to decrease pain/soreness  Supine hip flexor stretch with leg off the side 2x30s each side  Sidelying clamshells 2x12 each side with red TB around knees  Supine bridge with red TB around knees 2x12 with 2-3s hold  Supine figure 4 2x30s each side  Touchdown squats 2x10 with 5# DB                                                                  PATIENT EDUCATION:  Education details: See above Person educated: Patient Education method: Explanation, Demonstration, Tactile cues, Verbal cues, and Handouts Education comprehension: verbalized understanding, returned demonstration, verbal cues required, tactile cues required, and needs further education  HOME EXERCISE PROGRAM:  Access Code: [email or web address] URL: [email or web address] Date: 12/20/2023 Prepared by: Susannah Daring  Exercises - Standing Lumbar Extension at Wall - Forearms  - 5 x daily - 7 x weekly - 1 sets - 5 reps - 3 seconds hold - Single Knee to Chest Stretch  - 2-3 x daily - 7 x weekly - 1 sets - 5 reps - 20 seconds hold - Seated Hamstring Stretch  - 2-3 x daily - 7 x weekly - 3-5 reps - 30 seconds hold - Lateral Shift Correction at Wall  - 2 x daily - 7 x weekly - 3 reps - 20 seconds hold - Standing Hip Flexor Stretch  - 1 x daily - 7 x weekly - 3 sets - 3 reps - 20 seconds hold - Supine Hamstring Stretch with Strap  - 1 x  daily - 7 x weekly - 3 sets - 30s hold                                     ASSESSMENT:  CLINICAL IMPRESSION:  Patient arrived to session noting no large increases in pain like he has been in the past. Patient reports that he has no been able to see MD quite yet for a follow up but will be headed in soon. Patient tolerated all activities this  date and endorses improvement in symptoms following IASTM. Patient will continue to benefit from skilled PT.    OBJECTIVE IMPAIRMENTS: Abnormal gait, decreased activity tolerance, decreased endurance, decreased knowledge of condition, difficulty walking, decreased ROM, decreased strength, decreased safety awareness, impaired perceived functional ability, impaired flexibility, improper body mechanics, postural dysfunction, and pain.   ACTIVITY LIMITATIONS: carrying, lifting, bending, standing, stairs, bed mobility, and locomotion level  PARTICIPATION LIMITATIONS: community activity  PERSONAL FACTORS: Significant cardiac history including chronic heart failure, stage II chronic kidney disease, diabetes, previous shoulder arthroscopy, former smoker for 50 years are also affecting patient's functional outcome.   REHAB POTENTIAL: Good  CLINICAL DECISION MAKING: Stable/uncomplicated  EVALUATION COMPLEXITY: Low   GOALS: Goals reviewed with patient? Yes  SHORT TERM GOALS: Target date: 12/29/2023  Rondrick will be independent with his day 1 home exercise program Baseline: Started 12/01/2023 Goal status: ongoing 12/13/23  2.  Improve pain-free lumbar extension AROM without an increase in radicular symptoms to 10 degrees Baseline: 5 degrees Goal status: INITIAL  3.  Improve bilateral lower extremity flexibility for hip flexion to 90 degrees; hamstrings to 40 degrees and hip ER to 25 degrees Baseline: 85/80; 30/30 and 18/26 respectively Goal status: INITIAL   LONG TERM GOALS: Target date: 01/26/2024  Improve patient-specific functional score to at least  5 Baseline: 2 Goal status: INITIAL  2.  Faaris will report low back pain no greater than 3/10 on the numeric pain rating scale Baseline: 3-6/10 with symptoms in the mostly anterior thigh as well Goal status: INITIAL  3.  Improve low back strength as assessed by [place] ability to stand and walk for 10 or more minutes without an increase in symptoms Baseline: Symptoms almost immediately when standing and walking Goal status: INITIAL  4.  Izyan will be independent with his long-term maintenance home exercise program at discharge Baseline: Started 12/01/2023 Goal status: INITIAL   PLAN:  PT FREQUENCY: 1-2x/week  PT DURATION: 8 weeks  PLANNED INTERVENTIONS: 97110-Therapeutic exercises, 97530- Therapeutic activity, 97535- Self Care, 02859- Manual therapy, [id number]- Gait training, [phone]- Electrical stimulation (unattended), [id number]- Traction (mechanical), 20560 (1-2 muscles), 20561 (3+ muscles)- Dry Needling, Patient/Family education, Spinal mobilization, Cryotherapy, and Moist heat.  PLAN FOR NEXT SESSION:   core strengthening, lumbar and LE stretching as tolerated, mobs as needed  PHYSICAL THERAPY DISCHARGE SUMMARY  Visits from Start of Care: 8  Current functional level related to goals / functional outcomes: See above    Remaining deficits: See above    Education / Equipment: HEP   Patient agrees to discharge. Patient goals were partially met. Patient is being discharged due to not returning since the last visit.   Susannah Daring, PT, DPT 01/19/2024 8:47 AM   "

## 2024-01-19 ENCOUNTER — Ambulatory Visit (INDEPENDENT_AMBULATORY_CARE_PROVIDER_SITE_OTHER)

## 2024-01-19 DIAGNOSIS — M6281 Muscle weakness (generalized): Secondary | ICD-10-CM | POA: Diagnosis not present

## 2024-01-19 DIAGNOSIS — M5459 Other low back pain: Secondary | ICD-10-CM | POA: Diagnosis not present

## 2024-01-19 DIAGNOSIS — R262 Difficulty in walking, not elsewhere classified: Secondary | ICD-10-CM | POA: Diagnosis not present

## 2024-01-19 DIAGNOSIS — R293 Abnormal posture: Secondary | ICD-10-CM | POA: Diagnosis not present

## 2024-01-23 NOTE — Therapy (Incomplete)
 OUTPATIENT PHYSICAL THERAPY THORACOLUMBAR TREATMENT    Patient Name: James Jones MRN: [id number] DOB:1945/07/17, 78 y.o., male Today's Date: 01/23/2024     END OF SESSION:       Past Medical History:  Diagnosis Date   Aortic stenosis 04/02/2021   Chronic diastolic heart failure (HCC)    CKD (chronic kidney disease) stage 2, GFR 60-89 ml/min 03/17/2012   Coronary artery disease, non-occlusive 03/18/2012   60-70% stenosis in RCA -- FFR 0.84; EF 60-65%   Diabetes mellitus without complication (HCC)    GERD (gastroesophageal reflux disease)    On PPI   History of GI bleed 02/2013   No obvious findings on EGD/colonoscopy   Hydrocele, bilateral    large on US  2020   Hyperlipidemia    Hypertension    Obesity (BMI 30.0-34.9) 10/02/2012   OSA (obstructive sleep apnea) 03/18/2012   Doing better with CPAP   Pulmonary hypertension (HCC) 12/10/2010   ECHO:  Mild PH,mild LVH; PA pressures estimated 30-40 mmHg   Pulmonary hypertension, unspecified (HCC) 06/04/2021   RBBB, 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   Event monitor; usually during sleeping hours; therefore not on beta blocker   Past Surgical History:  Procedure Laterality Date   APPENDECTOMY  1974   CARPAL TUNNEL RELEASE     EYE SURGERY  1960   Left eye   HEMORRHOID SURGERY     LEFT HEART CATH AND CORONARY ANGIOGRAPHY  03/27/2010   Moderate mid RCA lesion,right radial approach,normal EF   LEFT HEART CATHETERIZATION WITH CORONARY ANGIOGRAM N/A 03/17/2012   Procedure: LEFT HEART CATHETERIZATION WITH CORONARY ANGIOGRAM;  Surgeon: Alm LELON Clay, MD;  Location: [facility] CATH LAB;  Service: Cardiovascular: Fractional Flow Reserve Measurement of mid RCA 60-70% stenosis = 0.84. Otherwise mild LCA CAD.  Normal EF & EDP   SHOULDER ARTHROSCOPY  2006   TRANSTHORACIC ECHOCARDIOGRAM  12/2010    Mild concentric LVH.  EF> 55%.  GR 1 DD.  Mild aortic sclerosis no stenosis.  PA pressures estimated 30-40 mmHg   Patient  Active Problem List   Diagnosis Date Noted   Lipoma of torso 05/10/2023   Screen for colon cancer 08/31/2022   Ganglion cyst of wrist, right 02/16/2022   Pain in right hand 02/03/2022   Atypical chest pain 10/05/2021   GERD (gastroesophageal reflux disease)    Chronic diastolic heart failure (HCC)    Stage 3b chronic kidney disease (CKD) (HCC)    Pulmonary hypertension, unspecified (HCC) 06/04/2021   Aortic stenosis 04/02/2021   History of anemia 01/27/2021   Class 1 obesity with serious comorbidity and body mass index (BMI) of 30.0 to 30.9 in adult 02/13/2019   Heartburn 03/24/2016   OSA on CPAP 08/02/2015   Controlled diabetes mellitus with circulatory complication, without long-term current use of insulin  (HCC) 04/29/2014   Acute blood loss anemia 02/13/2013   Hypotestosteronism 01/02/2013   Type 2 diabetes mellitus without complication 01/01/2013   Dyslipidemia, goal LDL below 70 10/15/2012   Obesity (BMI 30.0-34.9) 10/02/2012   Iron deficiency anemia 09/29/2012   Kidney disease 09/29/2012   Microscopic hematuria 04/04/2012   AV block, 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 Duanne, MD  REFERRING PROVIDER: Victory Gunnels, MD  REFERRING DIAG: [phone] (ICD-10-CM) - Spinal stenosis, lumbar region with neurogenic claudication  Rationale for Evaluation and Treatment: Rehabilitation  THERAPY DIAG:  No diagnosis found.  ONSET DATE: 3 to 4 months ago  SUBJECTIVE:                                                                                                                                                                                            SUBJECTIVE STATEMENT: *** Patient reports that he is doing well and hasn't been having those large increases in pain.   PERTINENT HISTORY:  Significant cardiac history including chronic heart failure, stage II chronic kidney disease, diabetes, previous shoulder arthroscopy, former smoker for 50 years  PAIN:  Are you having pain? Yes: NPRS scale: 2/10 this morning, 5/10 on average  Pain location: Low back and mostly anterior thighs Pain description: Sore, like I'm being punched in my back Aggravating factors: Standing and walking, particularly when bent forward Relieving factors: Grabbing a cart when walking helps, change of position  PRECAUTIONS: Back  RED FLAGS: Be aware of significant cardiac history   WEIGHT BEARING RESTRICTIONS: No  FALLS:  Has patient fallen in last 6 months? No  LIVING ENVIRONMENT: Lives with: lives with their family and lives with their spouse Lives in: House/apartment Stairs: Difficulty ascending Has following equipment at home: None  OCCUPATION: Retired  PLOF: Independent  PATIENT GOALS: Return to normal house chores (dishes) with out pain  NEXT MD VISIT: 12/25/2023  OBJECTIVE:  Note: Objective measures were completed at Evaluation unless otherwise noted.  DIAGNOSTIC FINDINGS:  1. Bulky spondylitic spurring with multilevel ankylosis. 2. Focal foraminal impingement at T8-9 on the left, and open level by chest CT in 2024, with degeneration. The spinal canal is widely patent.   M48.062 (ICD-10-CM) - Spinal stenosis, lumbar region with neurogenic claudication  PATIENT SURVEYS:  PSFS: THE PATIENT SPECIFIC FUNCTIONAL SCALE  Place score of 0-10 (0 = unable to perform activity and 10 = able to perform activity at the same level as before injury or problem)  Activity Date: 12/01/2023  01/19/2024    Standing 2 5   2.   Walking 2 5   3.     4.      Total Score 2 5     Total Score = Sum of activity scores/number of activities  Minimally Detectable Change: 3 points (for single activity); 2 points (for average score)  [facility] (nd). The Patient Specific Functional Scale . Retrieved from [email or web address]  COGNITION: Overall cognitive status: Within functional limits for tasks assessed     SENSATION: Only thigh pain with prolonged standing  MUSCLE LENGTH: Hamstrings: Right 30 deg; Left 30 deg  POSTURE: rounded shoulders, forward head, decreased lumbar lordosis, and flexed trunk   LUMBAR ROM:   AROM 12/01/2023  Flexion   Extension 5  Right lateral flexion   Left lateral flexion 20  Right rotation 20  Left rotation    (Blank rows = not tested)  LOWER EXTREMITY ROM:     Passive  Left/Right 12/01/2023   Hip flexion 85/80   Hip extension    Hip abduction    Hip adduction    Hip internal rotation 8/4   Hip external rotation 18/26   Knee flexion    Knee extension    Ankle dorsiflexion    Ankle plantarflexion    Ankle inversion    Ankle eversion     (Blank rows = not tested)  STRENGTH:  Deferred at evaluation  MMT Right / Left 12/13/23   Hip flexion 40.1 / 39.5   Hip extension    Hip abduction    Hip adduction    Hip internal rotation    Hip external rotation    Knee flexion    Knee extension 59.8 / 62.2   Ankle dorsiflexion    Ankle plantarflexion    Ankle inversion    Ankle eversion     (Blank rows = not tested)  GAIT: Distance walked: 50 feet Assistive device utilized: None Level of assistance: Complete Independence Comments: Summer ambulates in a flexed posture and notes he has increases in back pain almost immediately when standing and walking  TREATMENT DATE:   01/24/2024 ***   01/19/2024 TherEx:  UBE with bilat UE and LE level 3 for 4 min fwd/4 min back  Lower trunk rotations 3x30s each  side  Sidelying open books 1x12 each side  Sidelying clamshells with red TB 2x8 with 2s holds  Bridge with yellow ball between knees 2x12   Manual:  IASTM with percussive device to bilat QL, bilat lumbar musculature, and bilat mid-lower thoracic musculature  Grade 2 mods to lower thoracic and lumbar spine    01/02/2024 TherEx:  Nustep level 4 with bilat UE and LE for 8 minutes ; patient endorsing no increase in pain, but slight discomfort in low back  Lower trunk rotations 1x8 each side with 5-8s holds  Single knee to chest 2x30s each side  Supine hamstring stretch 2x30s each side  Sidelying clamshells 1x12 each side with 2-3s holds  Supine bridge with yellow ball between knees 1x12   Manual:  IASTM with percussive device to bilat QL, bilat lumbar musculature, and bilat mid-lower thoracic musculature  Grade 2 mods to lower thoracic and lumbar spine    12/29/2023 Manual:  IASTM with percussive device to bilat QL, bilat lumbar musculature, and bilat thoracic musculature  Grade 2 mods to lumbar spine   TherEx:  Lower trunk rotations 1x8 with 10-15s holds  PT discussed importance of mobility, following up with MD, POC options                                                  PATIENT EDUCATION:  Education details: See above Person educated: Patient Education method: Explanation, Demonstration, Tactile cues, Verbal cues, and Handouts Education comprehension: verbalized understanding, returned  demonstration, verbal cues required, tactile cues required, and needs further education  HOME EXERCISE PROGRAM:  Access Code: [email or web address] URL: [email or web address] Date: 12/20/2023 Prepared by: Susannah Daring  Exercises - Standing Lumbar Extension at Wall - Forearms  - 5 x daily - 7 x weekly - 1 sets - 5 reps - 3 seconds hold - Single Knee to Chest Stretch  - 2-3 x daily - 7 x weekly - 1 sets - 5 reps - 20 seconds hold - Seated Hamstring Stretch  - 2-3 x daily - 7 x weekly  - 3-5 reps - 30 seconds hold - Lateral Shift Correction at Wall  - 2 x daily - 7 x weekly - 3 reps - 20 seconds hold - Standing Hip Flexor Stretch  - 1 x daily - 7 x weekly - 3 sets - 3 reps - 20 seconds hold - Supine Hamstring Stretch with Strap  - 1 x daily - 7 x weekly - 3 sets - 30s hold                                     ASSESSMENT:  CLINICAL IMPRESSION:  *** Patient arrived to session noting no large increases in pain like he has been in the past. Patient reports that he has no been able to see MD quite yet for a follow up but will be headed in soon. Patient tolerated all activities this date and endorses improvement in symptoms following IASTM. Patient will continue to benefit from skilled PT.    OBJECTIVE IMPAIRMENTS: Abnormal gait, decreased activity tolerance, decreased endurance, decreased knowledge of condition, difficulty walking, decreased ROM, decreased strength, decreased safety awareness, impaired perceived functional ability, impaired flexibility, improper body mechanics, postural dysfunction, and pain.   ACTIVITY LIMITATIONS: carrying, lifting, bending, standing, stairs, bed mobility, and locomotion level  PARTICIPATION LIMITATIONS: community activity  PERSONAL FACTORS: Significant cardiac history including chronic heart failure, stage II chronic kidney disease, diabetes, previous shoulder arthroscopy, former smoker for 50 years are also affecting patient's functional outcome.   REHAB POTENTIAL: Good  CLINICAL DECISION MAKING: Stable/uncomplicated  EVALUATION COMPLEXITY: Low   GOALS: Goals reviewed with patient? Yes  SHORT TERM GOALS: Target date: 12/29/2023  Osiel will be independent with his day 1 home exercise program Baseline: Started 12/01/2023 Goal status: ongoing 12/13/23  2.  Improve pain-free lumbar extension AROM without an increase in radicular symptoms to 10 degrees Baseline: 5 degrees Goal status: INITIAL  3.  Improve bilateral lower extremity  flexibility for hip flexion to 90 degrees; hamstrings to 40 degrees and hip ER to 25 degrees Baseline: 85/80; 30/30 and 18/26 respectively Goal status: INITIAL   LONG TERM GOALS: Target date: 01/26/2024  Improve patient-specific functional score to at least 5 Baseline: 2 Goal status: INITIAL  2.  Damarius will report low back pain no greater than 3/10 on the numeric pain rating scale Baseline: 3-6/10 with symptoms in the mostly anterior thigh as well Goal status: INITIAL  3.  Improve low back strength as assessed by [place] ability to stand and walk for 10 or more minutes without an increase in symptoms Baseline: Symptoms almost immediately when standing and walking Goal status: INITIAL  4.  Adonay will be independent with his long-term maintenance home exercise program at discharge Baseline: Started 12/01/2023 Goal status: INITIAL   PLAN:  PT FREQUENCY: 1-2x/week  PT DURATION: 8 weeks  PLANNED INTERVENTIONS: 97110-Therapeutic exercises, 97530- Therapeutic activity, 97535- Self  Care, 02859- Manual therapy, [id number]- Gait training, [phone]- Electrical stimulation (unattended), [phone]- Traction (mechanical), [phone] (1-2 muscles), [id number] (3+ muscles)- Dry Needling, Patient/Family education, Spinal mobilization, Cryotherapy, and Moist heat.  PLAN FOR NEXT SESSION:   *** core strengthening, lumbar and LE stretching as tolerated, mobs as needed   Susannah Daring, PT, DPT 01/23/2024 10:39 AM

## 2024-01-24 ENCOUNTER — Encounter

## 2024-01-26 ENCOUNTER — Encounter

## 2024-02-03 ENCOUNTER — Other Ambulatory Visit: Payer: Self-pay

## 2024-02-03 ENCOUNTER — Telehealth: Payer: Self-pay

## 2024-02-03 MED ORDER — ROSUVASTATIN CALCIUM 40 MG PO TABS: 40.0000 mg | ORAL_TABLET | Freq: Every day | ORAL | 0 refills | Status: AC

## 2024-02-03 NOTE — Telephone Encounter (Signed)
 Prescription Request  02/03/2024  LOV: 08/23/23  What is the name of the medication or equipment? rosuvastatin  (CRESTOR ) 40 MG tablet [[id number]]   Have you contacted your pharmacy to request a refill? Yes   Which pharmacy would you like this sent to?  [id number] [place], [place] - [address] [place] [address] Phone: [phone] Fax: [phone]    Patient notified that their request is being sent to the clinical staff for review and that they should receive a response within 2 business days.   Please advise at [facility] [phone]

## 2024-02-03 NOTE — Telephone Encounter (Signed)
 Sent in medication

## 2024-02-23 ENCOUNTER — Other Ambulatory Visit (HOSPITAL_BASED_OUTPATIENT_CLINIC_OR_DEPARTMENT_OTHER)

## 2024-02-23 DIAGNOSIS — I350 Nonrheumatic aortic (valve) stenosis: Secondary | ICD-10-CM | POA: Diagnosis not present

## 2024-02-23 LAB — ECHOCARDIOGRAM COMPLETE
AR max vel: 1.44 cm2
AV Area VTI: 1.55 cm2
AV Area mean vel: 1.34 cm2
AV Mean grad: 13 mmHg
AV Peak grad: 24.4 mmHg
Ao pk vel: 2.47 m/s
Area-P 1/2: 3.77 cm2
S' Lateral: 3.41 cm

## 2024-02-23 NOTE — Telephone Encounter (Signed)
 Patient denied for PAP application due to above FPL for AZ&ME (Farxiga )

## 2024-02-24 ENCOUNTER — Ambulatory Visit (HOSPITAL_BASED_OUTPATIENT_CLINIC_OR_DEPARTMENT_OTHER): Payer: Self-pay | Admitting: Family

## 2024-03-01 ENCOUNTER — Telehealth: Payer: Self-pay

## 2024-03-01 ENCOUNTER — Other Ambulatory Visit: Payer: Self-pay

## 2024-03-01 ENCOUNTER — Other Ambulatory Visit: Payer: Self-pay | Admitting: Family Medicine

## 2024-03-01 DIAGNOSIS — M109 Gout, unspecified: Secondary | ICD-10-CM

## 2024-03-01 DIAGNOSIS — I2510 Atherosclerotic heart disease of native coronary artery without angina pectoris: Secondary | ICD-10-CM

## 2024-03-01 DIAGNOSIS — I10 Essential (primary) hypertension: Secondary | ICD-10-CM

## 2024-03-01 MED ORDER — AMLODIPINE BESYLATE 10 MG PO TABS: 10.0000 mg | ORAL_TABLET | Freq: Every day | ORAL | 1 refills | Status: AC

## 2024-03-01 NOTE — Telephone Encounter (Signed)
 Prescription Request  03/01/2024  LOV: 08/23/23  What is the name of the medication or equipment? amLODipine  (NORVASC ) 10 MG tablet [[id number]]   Have you contacted your pharmacy to request a refill? Yes   Which pharmacy would you like this sent to?  [id number] [place], [place] - [address] [place] [address] Phone: [phone] Fax: [phone]    Patient notified that their request is being sent to the clinical staff for review and that they should receive a response within 2 business days.   Please advise at [facility] [phone]

## 2024-03-01 NOTE — Telephone Encounter (Signed)
 Sent in medication

## 2024-03-15 ENCOUNTER — Other Ambulatory Visit: Payer: Self-pay

## 2024-03-15 ENCOUNTER — Telehealth: Payer: Self-pay | Admitting: Family Medicine

## 2024-03-15 DIAGNOSIS — E1122 Type 2 diabetes mellitus with diabetic chronic kidney disease: Secondary | ICD-10-CM

## 2024-03-15 DIAGNOSIS — I10 Essential (primary) hypertension: Secondary | ICD-10-CM

## 2024-03-15 DIAGNOSIS — I2510 Atherosclerotic heart disease of native coronary artery without angina pectoris: Secondary | ICD-10-CM

## 2024-03-15 MED ORDER — DOXAZOSIN MESYLATE 4 MG PO TABS: 4.0000 mg | ORAL_TABLET | Freq: Every day | ORAL | 1 refills | Status: AC

## 2024-03-15 NOTE — Telephone Encounter (Signed)
 Prescription Request  03/15/2024  LOV: 08/23/2023  What is the name of the medication or equipment? doxazosin  (CARDURA ) 4 MG tablet   Have you contacted your pharmacy to request a refill? Yes   Which pharmacy would you like this sent to?  [id number] [place], [place] - [address] [place] [address] Phone: [phone] Fax: [phone]    Patient notified that their request is being sent to the clinical staff for review and that they should receive a response within 2 business days.   Please advise at [facility] [phone]

## 2024-05-10 ENCOUNTER — Encounter

## 2024-05-24 ENCOUNTER — Ambulatory Visit (HOSPITAL_BASED_OUTPATIENT_CLINIC_OR_DEPARTMENT_OTHER): Admitting: Cardiovascular Disease

## 2024-05-31 ENCOUNTER — Ambulatory Visit: Admitting: Internal Medicine

## 2024-08-14 ENCOUNTER — Other Ambulatory Visit

## 2024-08-23 ENCOUNTER — Encounter: Admitting: Family Medicine
